# Patient Record
Sex: Female | Born: 1935 | Race: Black or African American | Hispanic: No | Smoking: Never smoker
Health system: Southern US, Community
[De-identification: ages and names within clinical notes are randomized; demographics above are authoritative.]

## PROBLEM LIST (undated history)

## (undated) DIAGNOSIS — I959 Hypotension, unspecified: Secondary | ICD-10-CM

## (undated) DIAGNOSIS — I10 Essential (primary) hypertension: Secondary | ICD-10-CM

## (undated) DIAGNOSIS — D649 Anemia, unspecified: Secondary | ICD-10-CM

## (undated) DIAGNOSIS — I739 Peripheral vascular disease, unspecified: Secondary | ICD-10-CM

## (undated) DIAGNOSIS — F329 Major depressive disorder, single episode, unspecified: Secondary | ICD-10-CM

## (undated) DIAGNOSIS — E042 Nontoxic multinodular goiter: Secondary | ICD-10-CM

## (undated) DIAGNOSIS — Z87448 Personal history of other diseases of urinary system: Secondary | ICD-10-CM

## (undated) DIAGNOSIS — K219 Gastro-esophageal reflux disease without esophagitis: Secondary | ICD-10-CM

## (undated) DIAGNOSIS — I2510 Atherosclerotic heart disease of native coronary artery without angina pectoris: Secondary | ICD-10-CM

## (undated) DIAGNOSIS — R011 Cardiac murmur, unspecified: Secondary | ICD-10-CM

## (undated) DIAGNOSIS — N814 Uterovaginal prolapse, unspecified: Secondary | ICD-10-CM

## (undated) DIAGNOSIS — I471 Supraventricular tachycardia, unspecified: Secondary | ICD-10-CM

## (undated) DIAGNOSIS — I350 Nonrheumatic aortic (valve) stenosis: Secondary | ICD-10-CM

## (undated) DIAGNOSIS — I499 Cardiac arrhythmia, unspecified: Secondary | ICD-10-CM

## (undated) DIAGNOSIS — H409 Unspecified glaucoma: Secondary | ICD-10-CM

## (undated) DIAGNOSIS — M47812 Spondylosis without myelopathy or radiculopathy, cervical region: Secondary | ICD-10-CM

## (undated) DIAGNOSIS — I779 Disorder of arteries and arterioles, unspecified: Secondary | ICD-10-CM

## (undated) DIAGNOSIS — R4189 Other symptoms and signs involving cognitive functions and awareness: Secondary | ICD-10-CM

## (undated) DIAGNOSIS — E119 Type 2 diabetes mellitus without complications: Secondary | ICD-10-CM

## (undated) DIAGNOSIS — I5032 Chronic diastolic (congestive) heart failure: Secondary | ICD-10-CM

## (undated) DIAGNOSIS — I639 Cerebral infarction, unspecified: Secondary | ICD-10-CM

## (undated) DIAGNOSIS — F32A Depression, unspecified: Secondary | ICD-10-CM

## (undated) DIAGNOSIS — M109 Gout, unspecified: Secondary | ICD-10-CM

## (undated) DIAGNOSIS — E785 Hyperlipidemia, unspecified: Secondary | ICD-10-CM

## (undated) DIAGNOSIS — N189 Chronic kidney disease, unspecified: Secondary | ICD-10-CM

## (undated) DIAGNOSIS — E569 Vitamin deficiency, unspecified: Secondary | ICD-10-CM

## (undated) DIAGNOSIS — F419 Anxiety disorder, unspecified: Secondary | ICD-10-CM

## (undated) HISTORY — DX: Other symptoms and signs involving cognitive functions and awareness: R41.89

## (undated) HISTORY — DX: Essential (primary) hypertension: I10

## (undated) HISTORY — DX: Nontoxic multinodular goiter: E04.2

## (undated) HISTORY — DX: Personal history of other diseases of urinary system: Z87.448

## (undated) HISTORY — DX: Disorder of arteries and arterioles, unspecified: I77.9

## (undated) HISTORY — DX: Unspecified glaucoma: H40.9

## (undated) HISTORY — DX: Vitamin deficiency, unspecified: E56.9

## (undated) HISTORY — DX: Anemia, unspecified: D64.9

## (undated) HISTORY — DX: Hyperlipidemia, unspecified: E78.5

## (undated) HISTORY — DX: Major depressive disorder, single episode, unspecified: F32.9

## (undated) HISTORY — DX: Depression, unspecified: F32.A

## (undated) HISTORY — PX: TUBAL LIGATION: SHX77

## (undated) HISTORY — PX: EYE SURGERY: SHX253

## (undated) HISTORY — DX: Peripheral vascular disease, unspecified: I73.9

## (undated) HISTORY — DX: Gastro-esophageal reflux disease without esophagitis: K21.9

## (undated) HISTORY — DX: Cardiac murmur, unspecified: R01.1

## (undated) HISTORY — DX: Gout, unspecified: M10.9

## (undated) HISTORY — DX: Hypotension, unspecified: I95.9

## (undated) HISTORY — DX: Spondylosis without myelopathy or radiculopathy, cervical region: M47.812

## (undated) HISTORY — DX: Type 2 diabetes mellitus without complications: E11.9

---

## 2010-06-29 DIAGNOSIS — M47812 Spondylosis without myelopathy or radiculopathy, cervical region: Secondary | ICD-10-CM | POA: Insufficient documentation

## 2012-09-11 ENCOUNTER — Emergency Department: Payer: Self-pay | Admitting: Emergency Medicine

## 2012-09-11 LAB — COMPREHENSIVE METABOLIC PANEL
Alkaline Phosphatase: 70 U/L (ref 50–136)
Anion Gap: 5 — ABNORMAL LOW (ref 7–16)
BUN: 44 mg/dL — ABNORMAL HIGH (ref 7–18)
Bilirubin,Total: 0.3 mg/dL (ref 0.2–1.0)
Calcium, Total: 9.6 mg/dL (ref 8.5–10.1)
Co2: 29 mmol/L (ref 21–32)
EGFR (African American): 34 — ABNORMAL LOW
EGFR (Non-African Amer.): 30 — ABNORMAL LOW
Glucose: 181 mg/dL — ABNORMAL HIGH (ref 65–99)
Osmolality: 295 (ref 275–301)
SGOT(AST): 38 U/L — ABNORMAL HIGH (ref 15–37)
SGPT (ALT): 28 U/L (ref 12–78)
Sodium: 140 mmol/L (ref 136–145)
Total Protein: 7.6 g/dL (ref 6.4–8.2)

## 2012-09-11 LAB — CBC
HCT: 31.7 % — ABNORMAL LOW (ref 35.0–47.0)
MCHC: 33 g/dL (ref 32.0–36.0)
Platelet: 272 10*3/uL (ref 150–440)
RBC: 3.77 10*6/uL — ABNORMAL LOW (ref 3.80–5.20)
RDW: 16.2 % — ABNORMAL HIGH (ref 11.5–14.5)

## 2012-09-11 LAB — URINALYSIS, COMPLETE
Bilirubin,UR: NEGATIVE
Blood: NEGATIVE
Ketone: NEGATIVE
Nitrite: NEGATIVE
Ph: 7 (ref 4.5–8.0)
Protein: NEGATIVE
RBC,UR: 2 /HPF (ref 0–5)
Specific Gravity: 1.015 (ref 1.003–1.030)
WBC UR: 13 /HPF (ref 0–5)

## 2012-09-11 LAB — TROPONIN I: Troponin-I: <0.02 ng/mL

## 2012-09-12 DIAGNOSIS — R634 Abnormal weight loss: Secondary | ICD-10-CM | POA: Insufficient documentation

## 2012-09-14 ENCOUNTER — Emergency Department: Payer: Self-pay | Admitting: Emergency Medicine

## 2012-09-14 LAB — TROPONIN I: Troponin-I: <0.02 ng/mL

## 2012-09-14 LAB — COMPREHENSIVE METABOLIC PANEL
Alkaline Phosphatase: 62 U/L (ref 50–136)
BUN: 20 mg/dL — ABNORMAL HIGH (ref 7–18)
Co2: 28 mmol/L (ref 21–32)
Creatinine: 1.11 mg/dL (ref 0.60–1.30)
EGFR (African American): 52 — ABNORMAL LOW
EGFR (Non-African Amer.): 45 — ABNORMAL LOW
Glucose: 183 mg/dL — ABNORMAL HIGH (ref 65–99)
Potassium: 4 mmol/L (ref 3.5–5.1)
Total Protein: 7.5 g/dL (ref 6.4–8.2)

## 2012-09-14 LAB — URINALYSIS, COMPLETE
Bilirubin,UR: NEGATIVE
Hyaline Cast: 1
Nitrite: NEGATIVE
Ph: 6 (ref 4.5–8.0)
Protein: NEGATIVE
RBC,UR: 2 /HPF (ref 0–5)
Squamous Epithelial: 13
WBC UR: 18 /HPF (ref 0–5)

## 2012-09-14 LAB — CBC
HCT: 32.5 % — ABNORMAL LOW (ref 35.0–47.0)
MCH: 28 pg (ref 26.0–34.0)
MCHC: 34 g/dL (ref 32.0–36.0)
MCV: 82 fL (ref 80–100)
RBC: 3.96 10*6/uL (ref 3.80–5.20)
RDW: 16.1 % — ABNORMAL HIGH (ref 11.5–14.5)
WBC: 7.9 10*3/uL (ref 3.6–11.0)

## 2012-09-14 LAB — TSH: Thyroid Stimulating Horm: 3.09 u[IU]/mL

## 2012-09-19 ENCOUNTER — Inpatient Hospital Stay: Payer: Self-pay | Admitting: Internal Medicine

## 2012-09-19 DIAGNOSIS — I359 Nonrheumatic aortic valve disorder, unspecified: Secondary | ICD-10-CM

## 2012-09-19 LAB — URINALYSIS, COMPLETE
Glucose,UR: >=500 mg/dL (ref 0–75)
Ketone: NEGATIVE
Leukocyte Esterase: NEGATIVE
Nitrite: NEGATIVE
Ph: 5 (ref 4.5–8.0)
Protein: NEGATIVE
Squamous Epithelial: 4
WBC UR: 4 /HPF (ref 0–5)

## 2012-09-19 LAB — CBC
HCT: 32.8 % — ABNORMAL LOW (ref 35.0–47.0)
HGB: 11.2 g/dL — ABNORMAL LOW (ref 12.0–16.0)
MCH: 28.4 pg (ref 26.0–34.0)
MCHC: 34.1 g/dL (ref 32.0–36.0)
MCV: 83 fL (ref 80–100)
Platelet: 262 10*3/uL (ref 150–440)
RBC: 3.95 10*6/uL (ref 3.80–5.20)
RDW: 15.9 % — ABNORMAL HIGH (ref 11.5–14.5)
WBC: 12 10*3/uL — ABNORMAL HIGH (ref 3.6–11.0)

## 2012-09-19 LAB — COMPREHENSIVE METABOLIC PANEL
Bilirubin,Total: 0.5 mg/dL (ref 0.2–1.0)
Calcium, Total: 9.6 mg/dL (ref 8.5–10.1)
Creatinine: 1.07 mg/dL (ref 0.60–1.30)
EGFR (Non-African Amer.): 47 — ABNORMAL LOW
Osmolality: 278 (ref 275–301)
Potassium: 3.5 mmol/L (ref 3.5–5.1)
SGOT(AST): 34 U/L (ref 15–37)

## 2012-09-19 LAB — CK TOTAL AND CKMB (NOT AT ARMC)
CK, Total: 86 U/L (ref 21–215)
CK-MB: 5.4 ng/mL — ABNORMAL HIGH (ref 0.5–3.6)
CK-MB: 5.8 ng/mL — ABNORMAL HIGH (ref 0.5–3.6)

## 2012-09-19 LAB — TROPONIN I: Troponin-I: 0.06 ng/mL — ABNORMAL HIGH

## 2012-09-20 DIAGNOSIS — I471 Supraventricular tachycardia, unspecified: Secondary | ICD-10-CM

## 2012-09-20 DIAGNOSIS — R5383 Other fatigue: Secondary | ICD-10-CM

## 2012-09-20 DIAGNOSIS — I10 Essential (primary) hypertension: Secondary | ICD-10-CM

## 2012-09-20 DIAGNOSIS — R5381 Other malaise: Secondary | ICD-10-CM

## 2012-09-20 DIAGNOSIS — E860 Dehydration: Secondary | ICD-10-CM

## 2012-09-20 LAB — BASIC METABOLIC PANEL
Anion Gap: 7 (ref 7–16)
BUN: 12 mg/dL (ref 7–18)
Co2: 24 mmol/L (ref 21–32)
Creatinine: 0.78 mg/dL (ref 0.60–1.30)
EGFR (African American): >60
EGFR (Non-African Amer.): >60
Glucose: 155 mg/dL — ABNORMAL HIGH (ref 65–99)
Osmolality: 280 (ref 275–301)
Sodium: 139 mmol/L (ref 136–145)

## 2012-09-20 LAB — CBC WITH DIFFERENTIAL/PLATELET
Basophil #: 0 10*3/uL (ref 0.0–0.1)
Basophil %: 0.4 %
Eosinophil %: 0.5 %
HCT: 27.1 % — ABNORMAL LOW (ref 35.0–47.0)
HGB: 9.2 g/dL — ABNORMAL LOW (ref 12.0–16.0)
Lymphocyte %: 8.2 %
MCHC: 34 g/dL (ref 32.0–36.0)
Monocyte #: 1 x10 3/mm — ABNORMAL HIGH (ref 0.2–0.9)
Monocyte %: 10.4 %
Neutrophil #: 7.5 10*3/uL — ABNORMAL HIGH (ref 1.4–6.5)
RBC: 3.24 10*6/uL — ABNORMAL LOW (ref 3.80–5.20)
RDW: 15.8 % — ABNORMAL HIGH (ref 11.5–14.5)

## 2012-09-20 LAB — CK TOTAL AND CKMB (NOT AT ARMC): CK-MB: 5.1 ng/mL — ABNORMAL HIGH (ref 0.5–3.6)

## 2012-09-21 DIAGNOSIS — I509 Heart failure, unspecified: Secondary | ICD-10-CM

## 2012-09-21 LAB — CBC WITH DIFFERENTIAL/PLATELET
Basophil #: 0.1 10*3/uL (ref 0.0–0.1)
Basophil %: 0.8 %
Eosinophil %: 3.8 %
HCT: 23.2 % — ABNORMAL LOW (ref 35.0–47.0)
HGB: 8 g/dL — ABNORMAL LOW (ref 12.0–16.0)
Lymphocyte #: 0.8 10*3/uL — ABNORMAL LOW (ref 1.0–3.6)
Lymphocyte %: 12.1 %
MCH: 28.5 pg (ref 26.0–34.0)
Monocyte #: 0.6 x10 3/mm (ref 0.2–0.9)
Monocyte %: 9.1 %
RBC: 2.8 10*6/uL — ABNORMAL LOW (ref 3.80–5.20)
RDW: 15.6 % — ABNORMAL HIGH (ref 11.5–14.5)
WBC: 6.9 10*3/uL (ref 3.6–11.0)

## 2012-09-21 LAB — FERRITIN: Ferritin (ARMC): 175 ng/mL (ref 8–388)

## 2012-09-21 LAB — BASIC METABOLIC PANEL
Anion Gap: 5 — ABNORMAL LOW (ref 7–16)
Chloride: 110 mmol/L — ABNORMAL HIGH (ref 98–107)
Co2: 25 mmol/L (ref 21–32)
EGFR (Non-African Amer.): >60
Potassium: 2.9 mmol/L — ABNORMAL LOW (ref 3.5–5.1)
Sodium: 140 mmol/L (ref 136–145)

## 2012-09-21 LAB — RETICULOCYTES: Reticulocyte: 1.45 %

## 2012-09-21 LAB — IRON AND TIBC
Iron Bind.Cap.(Total): 166 ug/dL — ABNORMAL LOW (ref 250–450)
Unbound Iron-Bind.Cap.: 152 ug/dL

## 2012-09-21 LAB — POTASSIUM: Potassium: 3.7 mmol/L (ref 3.5–5.1)

## 2012-09-21 LAB — FOLATE: Folic Acid: 5.7 ng/mL (ref 3.1–100.0)

## 2012-09-21 LAB — LACTATE DEHYDROGENASE: LDH: 180 U/L (ref 81–246)

## 2012-09-22 LAB — CBC WITH DIFFERENTIAL/PLATELET
Basophil #: 0.1 10*3/uL (ref 0.0–0.1)
Eosinophil #: 0.1 10*3/uL (ref 0.0–0.7)
Eosinophil %: 1.8 %
Lymphocyte #: 0.6 10*3/uL — ABNORMAL LOW (ref 1.0–3.6)
MCH: 28.6 pg (ref 26.0–34.0)
MCHC: 34.5 g/dL (ref 32.0–36.0)
Monocyte #: 0.6 x10 3/mm (ref 0.2–0.9)
Neutrophil #: 6.2 10*3/uL (ref 1.4–6.5)
RBC: 2.72 10*6/uL — ABNORMAL LOW (ref 3.80–5.20)
WBC: 7.6 10*3/uL (ref 3.6–11.0)

## 2012-09-22 LAB — BASIC METABOLIC PANEL
BUN: 7 mg/dL (ref 7–18)
Chloride: 113 mmol/L — ABNORMAL HIGH (ref 98–107)
Sodium: 142 mmol/L (ref 136–145)

## 2012-09-22 LAB — MAGNESIUM: Magnesium: 1.5 mg/dL — ABNORMAL LOW

## 2012-09-23 LAB — MAGNESIUM: Magnesium: 2 mg/dL

## 2012-09-23 LAB — CBC WITH DIFFERENTIAL/PLATELET
Basophil #: 0.1 10*3/uL (ref 0.0–0.1)
Basophil %: 1 %
Eosinophil #: 0.3 10*3/uL (ref 0.0–0.7)
HCT: 22.8 % — ABNORMAL LOW (ref 35.0–47.0)
Lymphocyte #: 0.6 10*3/uL — ABNORMAL LOW (ref 1.0–3.6)
MCH: 27.2 pg (ref 26.0–34.0)
MCHC: 33 g/dL (ref 32.0–36.0)
MCV: 83 fL (ref 80–100)
Monocyte #: 0.5 x10 3/mm (ref 0.2–0.9)
RBC: 2.76 10*6/uL — ABNORMAL LOW (ref 3.80–5.20)
RDW: 15.6 % — ABNORMAL HIGH (ref 11.5–14.5)
WBC: 6.9 10*3/uL (ref 3.6–11.0)

## 2012-09-23 LAB — BASIC METABOLIC PANEL
Calcium, Total: 8.5 mg/dL (ref 8.5–10.1)
Chloride: 111 mmol/L — ABNORMAL HIGH (ref 98–107)
Co2: 23 mmol/L (ref 21–32)
Creatinine: 0.62 mg/dL (ref 0.60–1.30)
EGFR (African American): >60
Glucose: 105 mg/dL — ABNORMAL HIGH (ref 65–99)
Potassium: 3.9 mmol/L (ref 3.5–5.1)
Sodium: 140 mmol/L (ref 136–145)

## 2012-09-24 LAB — CBC WITH DIFFERENTIAL/PLATELET
Basophil #: 0.1 10*3/uL (ref 0.0–0.1)
Basophil %: 0.8 %
Eosinophil %: 5.2 %
Lymphocyte #: 0.9 10*3/uL — ABNORMAL LOW (ref 1.0–3.6)
Lymphocyte %: 11.3 %
MCH: 27.8 pg (ref 26.0–34.0)
MCHC: 33.9 g/dL (ref 32.0–36.0)
MCV: 82 fL (ref 80–100)
Monocyte #: 0.4 x10 3/mm (ref 0.2–0.9)
Monocyte %: 5.2 %
Neutrophil #: 6.1 10*3/uL (ref 1.4–6.5)
Neutrophil %: 77.5 %
Platelet: 213 10*3/uL (ref 150–440)
RBC: 3 10*6/uL — ABNORMAL LOW (ref 3.80–5.20)
RDW: 15.6 % — ABNORMAL HIGH (ref 11.5–14.5)

## 2012-09-24 LAB — PATHOLOGY REPORT

## 2012-12-17 ENCOUNTER — Ambulatory Visit (INDEPENDENT_AMBULATORY_CARE_PROVIDER_SITE_OTHER): Payer: Medicare Other | Admitting: Cardiovascular Disease

## 2012-12-17 ENCOUNTER — Encounter: Payer: Self-pay | Admitting: Cardiovascular Disease

## 2012-12-17 ENCOUNTER — Encounter (INDEPENDENT_AMBULATORY_CARE_PROVIDER_SITE_OTHER): Payer: Self-pay

## 2012-12-17 VITALS — BP 160/90 | HR 75 | Ht 64.0 in | Wt 153.0 lb

## 2012-12-17 DIAGNOSIS — I471 Supraventricular tachycardia: Secondary | ICD-10-CM

## 2012-12-17 DIAGNOSIS — I5032 Chronic diastolic (congestive) heart failure: Secondary | ICD-10-CM

## 2012-12-17 DIAGNOSIS — I10 Essential (primary) hypertension: Secondary | ICD-10-CM

## 2012-12-17 DIAGNOSIS — I509 Heart failure, unspecified: Secondary | ICD-10-CM

## 2012-12-17 DIAGNOSIS — I498 Other specified cardiac arrhythmias: Secondary | ICD-10-CM

## 2012-12-17 DIAGNOSIS — E119 Type 2 diabetes mellitus without complications: Secondary | ICD-10-CM

## 2012-12-17 DIAGNOSIS — R0602 Shortness of breath: Secondary | ICD-10-CM

## 2012-12-17 MED ORDER — AMLODIPINE BESYLATE 10 MG PO TABS: 10.0000 mg | ORAL_TABLET | Freq: Every day | ORAL | Status: DC

## 2012-12-17 MED ORDER — LISINOPRIL 40 MG PO TABS: 40.0000 mg | ORAL_TABLET | Freq: Every day | ORAL | Status: DC

## 2012-12-17 NOTE — Assessment & Plan Note (Signed)
Blood pressure is very high today. We will restart lisinopril 40 mg daily. We have asked the daughter to closely monitor her her mothers blood pressure. If he continues to run high, we will start amlodipine 5 mg daily with slow titration up to 10 mg if needed.

## 2012-12-17 NOTE — Assessment & Plan Note (Signed)
We have encouraged continued exercise, careful diet management in an effort to lose weight. 

## 2012-12-17 NOTE — Assessment & Plan Note (Signed)
Appears relatively euvolemic on her visit today. Not drinking much at baseline. No diuretics needed.

## 2012-12-17 NOTE — Patient Instructions (Signed)
Blood pressure is high Please restart lisinopril 40 mg one a day Monitor blood pressure If it continues to run high, Start amlodipine 1/2 pill a day  If it runs consistently greater than 140, increase the amlodipine to 10 mg a day  Please call us if you have new issues that need to be addressed before your next appt.  Your physician wants you to follow-up in: 3 months.  You will receive a reminder letter in the mail two months in advance. If you don't receive a letter, please call our office to schedule the follow-up appointment.

## 2012-12-17 NOTE — Progress Notes (Signed)
Patient ID: [name], female    DOB: 11/05/1924, 79 y.o.   MRN: [id number]  HPI Comments: [name] is a pleasant 79 year old woman with a history of diabetes, hypertension, who was admitted to the hospital 09/20/2012 discharged on 09/25/2012 for Klebsiella UTI, shortness breath, tachycardia, SVT. Arrival to the hospital, she was very weak and was unable to swallow. She was felt to be very dehydrated. Her potassium was repleted, she was started on metoprolol and Cardizem. She presents today for followup establish care in the office.   Echocardiogram in the hospital 09/19/2012 showed ejection fraction 0000000, diastolic dysfunction, mild LVH, moderate aortic valve stenosis, normal RV function and size  CT scan of the neck showed enlargement of the thyroid with multinodular appearance, atherosclerotic disease, right pleural effusion Lab work in the hospital 09/11/2012 showing creatinine 1.57, BUN 44 Followup blood work showed creatinine 1.07, BUN 17 on July 23  In followup today, she reports that she is doing well. Daughter is concerned as she is sleeping sometimes during the daytime and when this happens, she has acute shortness of breath as she falls asleep. She walks around the house, does not walk outside the house very much. Typically uses a cane and walker. Otherwise she has no significant complaints. She denies any tachycardia or palpitations concerning for recurrent SVT.   EKG shows normal sinus rhythm with rate 75 beats per minute, nonspecific ST and T wave abnormality in V4 through V6, 3, aVF      Outpatient Encounter Prescriptions as of 12/17/2012  Medication Sig Dispense Refill  . amLODipine (NORVASC) 10 MG tablet Take 1 tablet (10 mg total) by mouth daily.  90 tablet  3  . aspirin 81 MG tablet Take 81 mg by mouth daily.      . cholecalciferol (VITAMIN D) 1000 UNITS tablet Take 1,000 Units by mouth daily.      . clotrimazole (LOTRIMIN) 1 % cream Apply 1 application topically  as needed.      . colchicine 0.6 MG tablet Take 0.6 mg by mouth daily.      . cyclobenzaprine (FLEXERIL) 10 MG tablet Take 10 mg by mouth 3 (three) times daily as needed for muscle spasms.      Marland Kitchen gabapentin (NEURONTIN) 300 MG capsule Take 300 mg by mouth 2 (two) times daily.      Marland Kitchen glycopyrrolate (ROBINUL) 1 MG tablet Take by mouth. Takes 1/4 tab;et three times daily.      Marland Kitchen HYDROcodone-acetaminophen (NORCO/VICODIN) 5-325 MG per tablet Take 1 tablet by mouth every 6 (six) hours as needed for pain.      Marland Kitchen latanoprost (XALATAN) 0.005 % ophthalmic solution Place 1 drop into the right eye at bedtime.      Marland Kitchen lisinopril (PRINIVIL,ZESTRIL) 40 MG tablet Take 1 tablet (40 mg total) by mouth daily.  90 tablet  3  . lovastatin (MEVACOR) 40 MG tablet Take 40 mg by mouth at bedtime.      . Magnesium 250 MG TABS Take 2 tablets by mouth daily.      . megestrol (MEGACE) 40 MG/ML suspension Take 200 mg by mouth daily.      . metoprolol succinate (TOPROL-XL) 50 MG 24 hr tablet Take 50 mg by mouth daily. Take with or immediately following a meal.      . pantoprazole (PROTONIX) 40 MG tablet Take 40 mg by mouth 2 (two) times daily.      . Pediatric Multiple Vit-C-FA (FLINSTONES GUMMIES OMEGA-3 [place]) CHEW Chew by  mouth daily.      . pilocarpine (PILOCAR) 4 % ophthalmic solution Place 1 drop into the right eye at bedtime.        Review of Systems  Constitutional: Positive for fatigue.  HENT: Negative.   Eyes: Negative.   Respiratory: Positive for shortness of breath.   Cardiovascular: Negative.   Gastrointestinal: Negative.   Endocrine: Negative.   Musculoskeletal: Positive for gait problem.  Skin: Negative.   Allergic/Immunologic: Negative.   Neurological: Negative.   Hematological: Negative.   Psychiatric/Behavioral: Negative.   All other systems reviewed and are negative.    BP 160/90  Pulse 75  Ht 5\' 4"  (1.626 m)  Wt 153 lb (69.4 kg)  BMI 26.25 kg/m2  Physical Exam  Nursing note and vitals  reviewed. Constitutional: She is oriented to person, place, and time. She appears well-developed and well-nourished.  HENT:  Head: Normocephalic.  Nose: Nose normal.  Mouth/Throat: Oropharynx is clear and moist.  Eyes: Conjunctivae are normal. Pupils are equal, round, and reactive to light.  Neck: Normal range of motion. Neck supple. No JVD present.  Cardiovascular: Normal rate, regular rhythm, S1 normal, S2 normal, normal heart sounds and intact distal pulses.  Exam reveals no gallop and no friction rub.   No murmur heard. Pulmonary/Chest: Effort normal and breath sounds normal. No respiratory distress. She has no wheezes. She has no rales. She exhibits no tenderness.  Abdominal: Soft. Bowel sounds are normal. She exhibits no distension. There is no tenderness.  Musculoskeletal: Normal range of motion. She exhibits no edema and no tenderness.  Lymphadenopathy:    She has no cervical adenopathy.  Neurological: She is alert and oriented to person, place, and time. Coordination normal.  Skin: Skin is warm and dry. No rash noted. No erythema.  Psychiatric: She has a normal mood and affect. Her behavior is normal. Judgment and thought content normal.    Assessment and Plan

## 2012-12-17 NOTE — Assessment & Plan Note (Addendum)
No further episodes of SVT for the patient. We'll continue her metoprolol.

## 2012-12-17 NOTE — Assessment & Plan Note (Signed)
Etiology of her acute episodes of shortness of breath as she falls asleep could be from sleep apnea. She does not appear to be short of breath with walking or movement. Does not appear to be fluid overloaded concerning for CHF.

## 2012-12-19 ENCOUNTER — Encounter: Payer: Self-pay | Admitting: *Deleted

## 2012-12-28 ENCOUNTER — Telehealth: Payer: Self-pay | Admitting: *Deleted

## 2012-12-28 NOTE — Telephone Encounter (Signed)
Bp readings: 12/11/12  128/65 pulse 80 @9 :30 am                  129/79 pulse 75 @ ?                  115/70 pulse 75 @ ?

## 2012-12-28 NOTE — Telephone Encounter (Signed)
Left message on machine that bp readings looked good and asked pt to call with any questions or concerns.

## 2013-02-28 DIAGNOSIS — G244 Idiopathic orofacial dystonia: Secondary | ICD-10-CM | POA: Insufficient documentation

## 2013-04-05 ENCOUNTER — Ambulatory Visit (INDEPENDENT_AMBULATORY_CARE_PROVIDER_SITE_OTHER): Payer: Medicare Other | Admitting: Cardiovascular Disease

## 2013-04-05 ENCOUNTER — Encounter: Payer: Self-pay | Admitting: Cardiovascular Disease

## 2013-04-05 VITALS — BP 124/82 | HR 81 | Ht 60.0 in | Wt 141.2 lb

## 2013-04-05 DIAGNOSIS — I5032 Chronic diastolic (congestive) heart failure: Secondary | ICD-10-CM

## 2013-04-05 DIAGNOSIS — I471 Supraventricular tachycardia: Secondary | ICD-10-CM

## 2013-04-05 DIAGNOSIS — E119 Type 2 diabetes mellitus without complications: Secondary | ICD-10-CM

## 2013-04-05 DIAGNOSIS — I10 Essential (primary) hypertension: Secondary | ICD-10-CM

## 2013-04-05 DIAGNOSIS — I498 Other specified cardiac arrhythmias: Secondary | ICD-10-CM

## 2013-04-05 DIAGNOSIS — I509 Heart failure, unspecified: Secondary | ICD-10-CM

## 2013-04-05 NOTE — Assessment & Plan Note (Signed)
No further arrhythmia per the patient.

## 2013-04-05 NOTE — Patient Instructions (Signed)
You are doing well. No medication changes were made.  Please call us if you have new issues that need to be addressed before your next appt.  Your physician wants you to follow-up in: 12 months.  You will receive a reminder letter in the mail two months in advance. If you don't receive a letter, please call our office to schedule the follow-up appointment. 

## 2013-04-05 NOTE — Progress Notes (Signed)
Patient ID: [name], female    DOB: 1924/12/25, 80 y.o.   MRN: [id number]  HPI Comments: [name] is a pleasant 80 year old woman with a history of diabetes, hypertension, who was admitted to the hospital 09/20/2012 discharged on 09/25/2012 for Klebsiella UTI, shortness breath, tachycardia, SVT. Arrival to the hospital, she was very weak and was unable to swallow. She was felt to be very dehydrated. Her potassium was repleted, she was started on metoprolol and Cardizem. She presents today for routine followup  She presents with her daughter. In general, she reports that she is doing well. She reports that she pushed herself during the Christmas holiday to get her house ready for family and visitors. She did have an episode of weakness and required rest and recovery. Daughter was concerned that she pushed herself too far. Other than this, she has been doing well with no complaints. She denies any significant chest pain, shortness of breath, leg edema or dizziness. She denies any tachycardia or palpitations. She walks around the house, does not walk outside the house very much. Typically uses a cane and walker. Otherwise she has no significant complaints.   Echocardiogram in the hospital 09/19/2012 showed ejection fraction 0000000, diastolic dysfunction, mild LVH, moderate aortic valve stenosis, normal RV function and size  CT scan of the neck showed enlargement of the thyroid with multinodular appearance, atherosclerotic disease, right pleural effusion Lab work in the hospital 09/11/2012 showing creatinine 1.57, BUN 44 Followup blood work showed creatinine 1.07, BUN 17 on July 23  EKG shows normal sinus rhythm with rate 81 beats per minute, nonspecific ST and T wave abnormality in V4 through V6, 3, aVF     Outpatient Encounter Prescriptions as of 04/05/2013  Medication Sig  . aspirin 81 MG tablet Take 81 mg by mouth daily.  . brimonidine (ALPHAGAN P) 0.1 % SOLN Place 1 drop into the  right eye 2 (two) times daily.  . brimonidine (ALPHAGAN) 0.15 % ophthalmic solution Place 1 drop into the right eye 2 (two) times daily.  . cholecalciferol (VITAMIN D) 1000 UNITS tablet Take 2,000 Units by mouth daily.   . clotrimazole (LOTRIMIN) 1 % cream Apply 1 application topically as needed.  . colchicine 0.6 MG tablet Take 0.6 mg by mouth daily.  . cyclobenzaprine (FLEXERIL) 10 MG tablet Take 10 mg by mouth 3 (three) times daily as needed for muscle spasms.  . Dorzolamide HCl-Timolol Mal (COSOPT OP) Apply to eye 2 (two) times daily.  Marland Kitchen gabapentin (NEURONTIN) 300 MG capsule Take 300 mg by mouth 2 (two) times daily.  Marland Kitchen glimepiride (AMARYL) 1 MG tablet Take 1 mg by mouth daily with breakfast.  . glycopyrrolate (ROBINUL) 1 MG tablet Take by mouth. Takes 1/4 tab;et three times daily.  Marland Kitchen HYDROcodone-acetaminophen (NORCO/VICODIN) 5-325 MG per tablet Take 1 tablet by mouth every 6 (six) hours as needed for pain.  . iron polysaccharides (NIFEREX) 150 MG capsule Take 150 mg by mouth 2 (two) times daily.  Marland Kitchen lisinopril (PRINIVIL,ZESTRIL) 40 MG tablet Take 1 tablet (40 mg total) by mouth daily.  Marland Kitchen lovastatin (MEVACOR) 40 MG tablet Take 40 mg by mouth at bedtime.  . Magnesium 250 MG TABS Take 2 tablets by mouth daily.  . metoprolol succinate (TOPROL-XL) 50 MG 24 hr tablet Take 50 mg by mouth daily. Take with or immediately following a meal.  . pantoprazole (PROTONIX) 40 MG tablet Take 40 mg by mouth 2 (two) times daily.  . pilocarpine (PILOCAR) 4 % ophthalmic solution  Place 1 drop into the right eye 4 (four) times daily.     Review of Systems  Constitutional: Positive for fatigue.  HENT: Negative.   Eyes: Negative.   Cardiovascular: Negative.   Gastrointestinal: Negative.   Endocrine: Negative.   Musculoskeletal: Positive for gait problem.  Skin: Negative.   Allergic/Immunologic: Negative.   Neurological: Negative.   Hematological: Negative.   Psychiatric/Behavioral: Negative.   All other  systems reviewed and are negative.    BP 124/82  Pulse 81  Ht 5' (1.524 m)  Wt 141 lb 4 oz (64.071 kg)  BMI 27.59 kg/m2  Physical Exam  Nursing note and vitals reviewed. Constitutional: She is oriented to person, place, and time. She appears well-developed and well-nourished.  HENT:  Head: Normocephalic.  Nose: Nose normal.  Mouth/Throat: Oropharynx is clear and moist.  Eyes: Conjunctivae are normal. Pupils are equal, round, and reactive to light.  Neck: Normal range of motion. Neck supple. No JVD present.  Cardiovascular: Normal rate, regular rhythm, S1 normal, S2 normal, normal heart sounds and intact distal pulses.  Exam reveals no gallop and no friction rub.   No murmur heard. Pulmonary/Chest: Effort normal and breath sounds normal. No respiratory distress. She has no wheezes. She has no rales. She exhibits no tenderness.  Abdominal: Soft. Bowel sounds are normal. She exhibits no distension. There is no tenderness.  Musculoskeletal: Normal range of motion. She exhibits no edema and no tenderness.  Lymphadenopathy:    She has no cervical adenopathy.  Neurological: She is alert and oriented to person, place, and time. Coordination normal.  Skin: Skin is warm and dry. No rash noted. No erythema.  Psychiatric: She has a normal mood and affect. Her behavior is normal. Judgment and thought content normal.    Assessment and Plan

## 2013-04-05 NOTE — Assessment & Plan Note (Signed)
Blood pressure is well controlled on today's visit. No changes made to the medications. 

## 2013-04-05 NOTE — Assessment & Plan Note (Signed)
She appears relatively euvolemic on today's visit. No medication changes made

## 2013-04-05 NOTE — Assessment & Plan Note (Signed)
We have encouraged continued exercise, careful diet management in an effort to lose weight. 

## 2013-12-25 ENCOUNTER — Other Ambulatory Visit: Payer: Self-pay | Admitting: Cardiovascular Disease

## 2014-03-10 DIAGNOSIS — L602 Onychogryphosis: Secondary | ICD-10-CM | POA: Insufficient documentation

## 2014-03-10 DIAGNOSIS — M2042 Other hammer toe(s) (acquired), left foot: Secondary | ICD-10-CM | POA: Insufficient documentation

## 2014-03-10 DIAGNOSIS — E1142 Type 2 diabetes mellitus with diabetic polyneuropathy: Secondary | ICD-10-CM | POA: Insufficient documentation

## 2014-03-10 DIAGNOSIS — M7741 Metatarsalgia, right foot: Secondary | ICD-10-CM | POA: Insufficient documentation

## 2014-03-10 DIAGNOSIS — M2041 Other hammer toe(s) (acquired), right foot: Secondary | ICD-10-CM | POA: Insufficient documentation

## 2014-03-10 DIAGNOSIS — M7742 Metatarsalgia, left foot: Secondary | ICD-10-CM | POA: Insufficient documentation

## 2014-04-01 ENCOUNTER — Other Ambulatory Visit: Payer: Self-pay | Admitting: Cardiovascular Disease

## 2014-06-16 ENCOUNTER — Emergency Department
Admit: 2014-06-16 | Disposition: A | Discharge status: Home / Self Care | Payer: Self-pay | Admitting: Emergency Medicine

## 2014-06-16 DIAGNOSIS — R197 Diarrhea, unspecified: Secondary | ICD-10-CM | POA: Insufficient documentation

## 2014-06-16 LAB — COMPREHENSIVE METABOLIC PANEL
ALBUMIN: 3.6 g/dL
ALT: 13 U/L — AB
AST: 21 U/L
Alkaline Phosphatase: 55 U/L
Anion Gap: 8 (ref 7–16)
BUN: 19 mg/dL
Bilirubin,Total: 0.9 mg/dL
Calcium, Total: 8.9 mg/dL
Chloride: 104 mmol/L
Co2: 27 mmol/L
Creatinine: 1.1 mg/dL — ABNORMAL HIGH
EGFR (Non-African Amer.): 45 — ABNORMAL LOW
GFR CALC AF AMER: 52 — AB
Glucose: 216 mg/dL — ABNORMAL HIGH
Potassium: 3.2 mmol/L — ABNORMAL LOW
Sodium: 139 mmol/L
TOTAL PROTEIN: 6.5 g/dL

## 2014-06-16 LAB — URINALYSIS, COMPLETE
BILIRUBIN, UR: NEGATIVE
GLUCOSE, UR: NEGATIVE mg/dL (ref 0–75)
KETONE: NEGATIVE
Nitrite: NEGATIVE
Ph: 6 (ref 4.5–8.0)
Protein: 100
Specific Gravity: 1.015 (ref 1.003–1.030)

## 2014-06-16 LAB — TROPONIN I
Troponin-I: 0.09 ng/mL — ABNORMAL HIGH
Troponin-I: 0.08 ng/mL — ABNORMAL HIGH

## 2014-06-16 LAB — CBC
HCT: 38.1 % (ref 35.0–47.0)
HGB: 12.7 g/dL (ref 12.0–16.0)
MCH: 30.6 pg (ref 26.0–34.0)
MCHC: 33.5 g/dL (ref 32.0–36.0)
MCV: 91 fL (ref 80–100)
Platelet: 218 10*3/uL (ref 150–440)
RBC: 4.16 10*6/uL (ref 3.80–5.20)
RDW: 14 % (ref 11.5–14.5)
WBC: 12 10*3/uL — AB (ref 3.6–11.0)

## 2014-06-16 LAB — LIPASE, BLOOD: LIPASE: 26 U/L

## 2014-06-16 LAB — MAGNESIUM: Magnesium: 1.8 mg/dL

## 2014-06-19 LAB — URINE CULTURE

## 2014-06-20 NOTE — H&P (Signed)
PATIENT NAME:  [name], GABLER MR#:  [id number] DATE OF BIRTH:  07-01-1924  DATE OF ADMISSION:  09/19/2012  PRIMARY CARE PHYSICIAN: PCP in [place].   CHIEF COMPLAINT: Generalized weakness, poor p.o. intake and lethargy.   HISTORY OF PRESENT ILLNESS: This is an 81 year old female, who presents to the hospital due to generalized weakness, lethargy and poor p.o. intake. As per the daughter, the patient over the past month, has had poor p.o. intake complaining of a bitter taste in her mouth and not eating much. She came to the ER in the middle of July and on July 15th was diagnosed with some dehydration, given some fluids and discharged back home. They called the patient's family back, as her urinalysis was positive and she was noted to have a Klebsiella UTI. Ciprofloxacin was called in for her and she has been taking it, although her weakness and poor p.o. intake has not improved. Today, she was unable to even get out of bed. Therefore, family was a bit concerned and brought her to the ER for further evaluation. The patient denies any fevers. She denies any abdominal pain, nausea, vomiting, diarrhea, any chest pain, any shortness of breath, any dizziness, any loss of consciousness or any other associated symptoms presently. When the patient presented to the ER, she was noted to be in SVT with heart rates in the 160s, which resolved just with vagal maneuvers. She was noted to have an elevated troponin of 0.06. Hospitalist services were contacted for further evaluation.   REVIEW OF SYSTEMS: CONSTITUTIONAL: No documented fever. Positive 10 pound weight loss. No weight gain. EYES: No blurry or double vision. ENT: No tinnitus. No postnasal drip. No redness of the oropharynx. RESPIRATORY: No cough. No wheeze. No hemoptysis. No dyspnea. CARDIOVASCULAR: No chest pain. No orthopnea. No palpitations. No syncope.  GASTROINTESTINAL: No nausea. No vomiting. No diarrhea. No abdominal pain. No melena or hematochezia.  GENITOURINARY: No dysuria or hematuria. ENDOCRINE: No polyuria or nocturia. No heat or cold intolerance. HEMATOLOGIC: No anemia. No bruising. No bleeding.  INTEGUMENTARY: No rashes or lesions. MUSCULOSKELETAL: No arthritis. No swelling. No gout. NEUROLOGIC: No numbness or tingling. No ataxia. No seizure-type activity. PSYCHIATRIC: No anxiety. No insomnia. No ADD.   PAST MEDICAL HISTORY: Consistent with diabetes, hypertension, hyperlipidemia, history of congestive heart failure, history of glaucoma, GERD, depression and gout.   ALLERGIES: METFORMIN.   SOCIAL HISTORY: No smoking. No alcohol abuse. No illicit drug abuse. Lives at home by herself.   FAMILY HISTORY: Mother had glaucoma and died from old age. Father died in his 46s from possibly colitis and peptic ulcer disease.   CURRENT MEDICATIONS: As follows: Tylenol with hydrocodone 5/325 1/2 tab to 1 tab q.4 hours as needed for pain, aspirin 81 mg daily, brimonidine eye drops 0.15% drops 1 drop to the right eye b.i.d., ciprofloxacin 250 mg b.i.d., colchicine 0.6 mg daily, cyclobenzaprine 10 mg t.i.d. as needed, dorzolamide 2% drops 1 drop to right eye b.i.d., fluocinolone topical cream to be applied to the affected area as needed, gabapentin 600 mg t.i.d., hydrochlorothiazide 12.5 mg daily, Lantus 8 units at bedtime, latanoprost 0.005% drop to the right eye at bedtime, lisinopril 40 mg daily, lovastatin 40 mg daily, magnesium oxide 240 mg 2 tabs daily, Toprol 50 mg daily, omeprazole 20 mg daily, Protonix 40 mg daily, Peridex b.i.d., pilocarpine 4% ophthalmic drop q.i.d. to the right eye, sertraline 100 mg daily and vitamin D3 1000 international units 2 tabs daily.   PHYSICAL EXAMINATION: Presently is as follows:  VITAL SIGNS: Temperature is 98.1, pulse 101, respirations 20, blood pressure 120/67 and sats 100% on room air.  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  HEAD, EYES, EARS, NOSE, THROAT EXAM: Atraumatic,  normocephalic. Extraocular muscles are intact. Pupils equal and reactive to light. Sclerae anicteric. No conjunctival injection. No pharyngeal erythema.  NECK: Supple. There is no jugular venous distention. No bruits, no lymphadenopathy or thyromegaly.  HEART: Regular rate and rhythm. No murmurs. No rubs. No clicks.  LUNGS: Clear to auscultation bilaterally. No rales. No rhonchi. No wheezes.  ABDOMEN: Soft, flat, nontender, nondistended. Has good bowel sounds. No hepatosplenomegaly appreciated.  EXTREMITIES: No evidence of any cyanosis, clubbing, or peripheral edema. Has +2 pedal and radial pulses bilaterally.  NEUROLOGIC: She is alert, awake, and oriented x 3 with no focal motor or sensory deficits appreciated bilaterally.  SKIN: Moist and warm with no rash appreciated.  BACK: There is no cervical or axillary or lymphadenopathy.  LABORATORY EXAM: Showed a serum glucose of 251, BUN 17, creatinine 1.07, sodium 134, potassium 3.5, chloride 102, bicarbonate 20. LFTs within normal limits. Troponin 0.06. White cell count 12, hemoglobin 11.2, hematocrit 32.9, platelet count of 262. The patient did have a chest x-ray done, which showed no evidence of acute cardiopulmonary disease.   ASSESSMENT AND PLAN: This is an 81 year old female with a history of hypertension, history of congestive heart failure, diabetes, glaucoma, gout, hyperlipidemia, gastroesophageal reflux disease and depression, who presented to hospital due to generalized weakness, poor p.o. intake and weight loss and noted to be in supraventricular tachycardia and also noted to have an elevated troponin.  1.  Dehydration. This is likely due to poor p.o. intake and also complicated with underlying urinary tract infection as the patient has failed outpatient therapy with oral antibiotics. I will hydrate the patient with intravenous fluids start her on intravenous ceftriaxone for the urinary tract infection and follow her clinically and encourage p.o.  intake.  2.  Generalized weakness. This is likely secondary to her dehydration and also underlying urinary tract infection. I will hydrate her with intravenous fluids, give her intravenous antibiotics and follow her clinically. We will get a physical therapy consult to assess her mobility in the morning.  3.  Urinary tract infection. This is a Klebsiella urinary tract infection, which was diagnosed on 09/11/2012. The patient was on oral Cipro, but has failed outpatient therapy, therefore I will start on intravenous ceftriaxone and follow.  4.  Hypertension. The patient is presently hemodynamically stable, but given the dehydration, I will hold her hydrochlorothiazide and ACE inhibitor. I will continue her Toprol for now.  5.  Glaucoma. Continue with dorzolamide, brimonidine and latanoprost eye drops.   6.  Gastroesophageal reflux disease. Continue Protonix.  7.  Diabetes. We will place heron sliding scale insulin since her p.o. intake is poor. Hold Lantus for now.  8.  Supraventricular tachycardia. The patient presented with supraventricular tachycardia with heart rates in the 160s, which resolved just with vagal maneuvers. This is likely secondary to dehydration and urinary tract infection. I will continue her Toprol for now and keep her on telemetry.  9.  Elevated troponin. This likely in the setting of the supraventricular tachycardia and demand ischemia. The patient has no chest pain and no evidence of acute coronary syndrome. I will continue her aspirin, her Toprol and a statin for now. I will place on telemetry, follow serial cardiac markers and get a 2-dimensional echocardiogram.   The plan was discussed with  the patient's daughters and they are in agreement.   TIME SPENT ON ADMISSION: 50 minutes.  ____________________________ Belia Heman. Verdell Carmine, MD vjs:aw D: 09/19/2012 12:48:17 ET T: 09/19/2012 13:17:38 ET JOB#: [id number]  cc: Belia Heman. Verdell Carmine, MD, <Dictator> Henreitta Leber  MD ELECTRONICALLY SIGNED 09/29/2012 15:22

## 2014-06-20 NOTE — Discharge Summary (Signed)
PATIENT NAME:  [name], [name] MR#:  [id number] DATE OF BIRTH:  [birth year]  DATE OF ADMISSION:  09/19/2012 DATE OF DISCHARGE:  09/25/2012  FINAL DIAGNOSES:  1. Weakness and moderate malnutrition due to bad taste and salivation in mouth, not eating well.  2. Supraventricular tachycardia on presentation, due to electrolyte imbalance.  3. Multinodular goiter. Needs to follow up with endocrine clinic.  4. Urinary incontinence.  5. Urinary tract infection.  6. Hypertension.  7. Anemia.  8. Chronic diastolic heart failure.   CONDITION ON DISCHARGE: Stable.   CODE STATUS: Full code.   MEDICATIONS ON DISCHARGE:  1. Lovastatin 40 mg once a day.  2. Magnesium oxide 250 mg oral tablet once a day.  3. Vitamin D3 1000 international units 2 capsules once a day.  4. Latanoprost ophthalmic solution 1 drop to each eye once a day.  5. Pilocarpine ophthalmic solution 1 drop to right eye 4 times a day.  6. Acetaminophen and hydrocodone tablet take one-half to one tablet every 4 hours as needed for pain.  7. Metoprolol succinate 50 mg extended release tablet once a day.  8. Aspirin 81 mg once a day.  9. Colchicine 0.6 mg oral tablet once a day.  10. Gabapentin 300 mg 1 tablet 2 times a day.  11. Megestrol 40 mg/mL solution take 10 mL once a day.  12. Glycopyrrolate 1 mg/5 mL take 1 mL solution 3 times a day, take 1 hour before meal. 13. Zinc sulfate 220 mg oral capsule once a day.  14. Pantoprazole 40 mg delayed-release 2 times a day.  15. Multivitamin with minerals 1 tablet once a day.  16. Ensure Plus 4 times a day.   HOME HEALTH ON DISCHARGE: Yes.   HOME HEALTH SERVICES: Physical therapy and nurse.   DIET ON DISCHARGE: Low sodium.   DIET SUPPLEMENT: Ensure Plus 3 times a day.   DIET INSTRUCTIONS: Consistency regular and advised to encourage to drink more water.   FOLLOWUP: Advised to follow in 1 to 2 weeks with Dr. Gabriel Carina, endocrine clinic, for multinodular goiter, and follow up in ENT  clinic for persistent bad taste in the mouth, and also advised to discuss with the ophthalmologist about bad taste in the mouth and ask him if he can stop any of the eyedrops which are not strongly needed, and discuss with primary care physician about lung pseudotumor on CT and needs to follow up CT chest in a few months.   HISTORY OF PRESENTING ILLNESS: An 81 year old female with generalized weakness, lethargic and poor oral intake, brought by her daughter for not eating good for almost a month. Please see H and P done by Dr. Abel Presto on 23rd of July. The patient was treated for UTI recently, and for almost a month, she had been in ER in [place] and with her primary care doctor for repeated complaint of not eating well and getting dehydrated. So far, all the workup was negative by PMD and at [facility], and she was discharged once from here also from ER.   HOSPITAL COURSE AND STAY:  1. She came with SVT due to electrolyte imbalance and mild renal failure and so admitted for malnutrition, not eating well due to bad taste in the mouth. Given IV fluid, electrolytes corrected, monitored on telemetry, and she remained in sinus rhythm. Echocardiogram was done which showed diastolic chronic heart failure. Cardiology consult was done, denied any further workup. GI and ENT consults were done for her bad taste in the  mouth and increased salivation, and they did not find any significant finding except for mild stricture at gastroesophageal junction, which was dilated by ballooning by Dr. Allen Norris during endoscopy. The patient continued having complaint of this thing and was not eating well, but hydrated very well with IV fluids and given vitamins and supplements, and we finally thought this might be the complaint just because of some either underlying unknown malignancy, and to rule out that, we did CT of the neck which showed multinodular goiter with some calcifications. Spoke to Dr. Gabriel Carina. She advised to  follow in the clinic, and she might do sonogram as outpatient for thyroid gland.   OTHER MEDICAL ISSUES: 2. Urinary tract infection. She was recently treated for Klebsiella, and she was on ciprofloxacin. We continued ceftriaxone and we gave her 5 days in hospital.  3. Hypertension, hemodynamically stable, and we continued her medication.  4. Glaucoma. We continued her eyedrops which she was taking at home.  5. Dehydration due to poor oral intake. We gave IV fluids and corrected her hydration status, and she was feeling fine.   IMPORTANT LABORATORY DATA IN THE HOSPITAL: Hemoglobin 11.2, creatinine was 1.07 on presentation. Sodium was 134 and chloride was 3.5. Urinalysis was grossly negative for WBC and negative leukocyte esterase. Troponin 0.06. Chest x-ray, PA and lateral: No acute disease of the chest. Echocardiogram showed ejection fraction 55 to 60, impaired relaxation of left ventricular filling. Creatinine came to 0.78 later on in followup, and hemoglobin remained 9.2. Vitamin B12 normal. CT of the head: Normal. [place] color flow, upper extremity on the right side: Normal exam. X-ray of right hand: No fracture. There was a complaint of pain on that hand. Magnesium level 2. Hemoglobin at the time of discharge 8.4.   TOTAL TIME SPENT ON THIS DISCHARGE: 45 minutes.   ____________________________ Ceasar Lund Anselm Jungling, MD vgv:OSi D: 09/28/2012 13:11:21 ET T: 09/28/2012 13:46:19 ET JOB#: [id number]  cc: Ceasar Lund. Anselm Jungling, MD, <Dictator> A. Lavone Orn, MD Idalia Needle Chauncy Passy, MD Sammuel Hines. Richardson Landry, MD Vaughan Basta MD ELECTRONICALLY SIGNED 10/02/2012 23:23

## 2014-06-20 NOTE — Consult Note (Signed)
Brief Consult Note: Diagnosis: Dysguesia, Ptyalism.   Patient was seen by consultant.   Consult note dictated.   Comments: [name] is a very pleasant 81 y/o black female with 4 week history of failure to thrive, dysgeusia & ptyalism.  Second admission in past month with hx UTI, renal failure, CHF, gout, depression, weakness & dehydration.  She has also had 2 episodes of self-limited SVT with vagal maneuvers.  Denies dysphagia or odynophagia.  I will discuss her care with Dr Lucilla Lame.  She may benefit from EGD to determine if there is a component of GERD, bile reflux, erosive esophagitis, or stricture contributing to her symptoms.  Further recommendations to follow.  Addendum: EGD tomorrow.  Hold Lovenox for EGD.  Thanks for consult. Please see full dictated note.  [id number].  Electronic Signatures: Andria Meuse (NP)  (Signed 24-Jul-14 14:07)  Authored: Brief Consult Note   Last Updated: 24-Jul-14 14:07 by Andria Meuse (NP)

## 2014-06-20 NOTE — Op Note (Signed)
PATIENT NAME:  [name], [name] MR#:  [id number] DATE OF BIRTH:  1924-03-14  DATE OF PROCEDURE:    PREOPERATIVE DIAGNOSIS:  Poor appetite, foul taste in throat with pooling of salivary secretions.  POSTOPERATIVE DIAGNOSIS:  Poor appetite, foul taste in throat with pooling of salivary secretions.   PROCEDURE:  Flexible fiber-optic nasal laryngoscopy.   SURGEON:  Malon Kindle, M.D.   ANESTHESIA:  None.   INDICATIONS:  This patient has had difficulty with poor appetite, with question of dysphagia due to pooling of saliva in her mouth, although the family reports that she seems to be swallowing fine.  She just does not want to eat because of the foul taste in her throat.   DESCRIPTION OF PROCEDURE:  After discussing the procedure with the patient, the scope was gently passed through the right nasal cavity.  Nasal cavity is free of any purulence.  The nasopharynx was unremarkable with no mass, lesions.  Hypopharynx, larynx and tongue base were carefully inspected.  The vocal cords were clear and mobile.  There was no evidence of any pooling of secretions in the hypopharynx.  There were no lesions of the tongue base, epiglottis or piriform sinus.  The scope was withdrawn.  The patient tolerated the procedure well.    ____________________________ Sammuel Hines. Richardson Landry, MD psb:ea D: 09/21/2012 18:10:06 ET T: 09/22/2012 02:36:27 ET JOB#: [id number]  cc: Sammuel Hines. Richardson Landry, MD, <Dictator> Riley Nearing MD ELECTRONICALLY SIGNED 09/26/2012 7:28

## 2014-06-20 NOTE — Consult Note (Signed)
Chief Complaint:  Subjective/Chief Complaint Pt continues to c/o bitter taste.  Denies N/V or abdominal pain.   VITAL SIGNS/ANCILLARY NOTES: **Vital Signs.:   28-Jul-14 04:15  Vital Signs Type Routine  Temperature Temperature (F) 98.2  Celsius 36.7  Temperature Source oral  Pulse Pulse 71  Respirations Respirations 18  Systolic BP Systolic BP XX123456  Diastolic BP (mmHg) Diastolic BP (mmHg) 75  Mean BP 95  Pulse Ox % Pulse Ox % 98  Pulse Ox Activity Level  At rest  Oxygen Delivery Room Air/ 21 %    07:50  Vital Signs Type Routine  Temperature Temperature (F) 98.9  Celsius 37.1  Temperature Source oral  Pulse Pulse 65  Respirations Respirations 17  Systolic BP Systolic BP [id number]  Diastolic BP (mmHg) Diastolic BP (mmHg) 79  Mean BP 102  Pulse Ox % Pulse Ox % 98  Pulse Ox Activity Level  At rest  Oxygen Delivery Room Air/ 21 %   Brief Assessment:  GEN no acute distress, cachectic, thin, A/Ox3   Cardiac Regular   Respiratory normal resp effort   Gastrointestinal details normal Soft  Nontender  Nondistended  No masses palpable  Bowel sounds normal   EXTR negative cyanosis/clubbing   Additional Physical Exam Skin: warm, dry   Lab Results: Routine Hem:  28-Jul-14 10:49   WBC (CBC) 7.9  RBC (CBC)  3.00  Hemoglobin (CBC)  8.4  Hematocrit (CBC)  24.7  Platelet Count (CBC) 213  MCV 82  MCH 27.8  MCHC 33.9  RDW  15.6  Neutrophil % 77.5  Lymphocyte % 11.3  Monocyte % 5.2  Eosinophil % 5.2  Basophil % 0.8  Neutrophil # 6.1  Lymphocyte #  0.9  Monocyte # 0.4  Eosinophil # 0.4  Basophil # 0.1 (Result(s) reported on 24 Sep 2012 at 11:13AM.)   Assessment/Plan:  Assessment/Plan:  Assessment Dysguesia/anorexia:  EGD mild gastritis.  Bx pending.  No further GI work-up   Plan Continue PPI daily Will sign off, call if any further questions or problems.   Electronic Signatures: Andria Meuse (NP)  (Signed 28-Jul-14 11:21)  Authored: Chief Complaint, VITAL  SIGNS/ANCILLARY NOTES, Brief Assessment, Lab Results, Assessment/Plan   Last Updated: 28-Jul-14 11:21 by Andria Meuse (NP)

## 2014-06-20 NOTE — Consult Note (Signed)
PATIENT NAME:  Cassandra, Green MR#:  [id number] DATE OF BIRTH:  January 14, 1925  DATE OF CONSULTATION:  09/20/2012  REFERRING PHYSICIAN:   CONSULTING PHYSICIAN:  Andria Meuse, NP  PRIMARY CARE PHYSICIAN: Ebony Cargo, NP  REASON FOR CONSULTATION: Dysphagia, dysguesia.   HISTORY OF PRESENT ILLNESS: [name] is a very pleasant 81 year old black female who was admitted to the hospital with weakness, lethargy and failure to thrive. She complains of ptyalism after eating. She also perceives a bitter taste in her mouth at all times. She has been deconditioned lately. She has a very poor appetite over the past month. She denies any abdominal pain, heartburn, indigestion, dysphagia or odynophagia. She has been seen by a gastroenterologist, Dr. Britta Mccreedy, in [place]. Apparently, he did some lab work which showed that she was dehydrated and had acute renal failure back on 07/15, and she was sent to the hospital for IV fluids and antibiotics. She says her symptoms really have not improved. She denies any nausea or vomiting. She has never had an EGD. She did have 2 episodes of SVT that were resolved with Valsalva maneuvers. She is awaiting cardiology consult.   She denies any rectal bleeding or melena, denies any constipation or diarrhea. She did have a negative chest x-ray.   PAST MEDICAL AND SURGICAL HISTORY:  1.  Diabetes mellitus. 2.  Hypertension. 3.  Hyperlipidemia. 4.  Failure to thrive. 5.  Congestive heart failure. 6.  Glaucoma. 7.  Recent UTI and acute renal failure. 8.  GERD. 9.  Depression. 10.  Gout.  MEDICATIONS PRIOR TO ADMISSION:  1.  Acetaminophen/hydrocodone 325/5 mg 1/2 to 1 q. 4 hours p.r.n. pain.  2.  Aspirin 81 mg daily. 3.  Brimonidine ophthalmic 0.15% 1 drop to right eye b.i.d. 4.  Cipro 250 mg b.i.d. for 7 days. 5.  Colcrys 0.5 mg once daily. 6.  Cyclobenzaprine 10 mg t.i.d. 7.  Dorzolamide ophthalmic 2% solution 1 drop to right eye b.i.d.  8.  Fluocinolone  topical 0.01% cream apply to effected area daily. 9.  Hydrochlorothiazide 12.5 mg daily. 10.  Lantus insulin 8 units sub-Q at bedtime. 11.  Latanoprost ophthalmic 0.005% to right eye at bedtime. 12.  Lisinopril 40 mg daily. 13.  Lovastatin 40 mg at bedtime. 14.  Magnesium oxide 250 mg 2 tablets once daily. 15.  Metoprolol succinate 50 mg extended release daily. 16.  Omeprazole 20 mg daily. 17.  Pantoprazole 40 mg daily. 18.  Peridex 0.12% mucous membrane liquid 15 mL b.i.d. 19.  Pilocarpine ophthalmic 4% 1 drop 4 times daily. 20.   0.025% vaginal gel 0.5 once weekly. 21.  Vitamin D3 1000 international units 2 capsules daily.  ALLERGIES: METFORMIN, UNKNOWN REACTION.   FAMILY HISTORY: There is no known family history of colorectal carcinoma, liver or chronic GI problems. She did lose a son to coronary artery disease. Father had peptic ulcer disease. Mother had glaucoma and died from old age.   SOCIAL HISTORY: She denies any tobacco, alcohol or illicit drug use. She lives at home by herself but has family nearby. She has 10 grown healthy children. Her primary caregiver is her daughter, [name].   [place]:  See HPI. CONSTITUTIONAL: She has had weakness, fatigue and malaise.  MUSCULOSKELETAL: She has had right hand swelling, right wrist and right thumb pain that has worsened over the last several days. She has a history of gout. Otherwise negative 12 point review of systems.   PHYSICAL EXAMINATION: VITAL SIGNS: Temperature 98.4, pulse 75,  respirations 19, blood pressure 113/69, O2 sat 100% on 2 liters via nasal cannula.  GENERAL: She is a pleasant, elderly black female who is alert, oriented, pleasant and cooperative, in no acute distress. She is accompanied by her multiple children, grandchild.  HEENT: Sclerae clear, anicteric. Conjunctivae pink. Oropharynx pink and moist without any lesions. She does have upper dentures intact.  NECK: Supple without mass or thyromegaly.  CHEST:  Heart regular rate and rhythm. Normal S1, S2. She has a 3/6 murmur noted.  No clicks, rubs or gallops.  LUNGS: Clear to auscultation bilaterally.  ABDOMEN: Positive bowel sounds x 4. No bruits auscultated. Abdomen is soft, nontender and nondistended without palpable mass or hepatosplenomegaly. No rebound, tenderness or guarding.  EXTREMITIES: Without edema. Good cap refill. Good pulses.  SKIN: Warm and dry without any rash or jaundice.  NEUROLOGIC:  Grossly intact.  MUSCULOSKELETAL: She has good equal strength bilaterally.  PSYCHIATRIC:  She denies depression, anxiety. Normal mood and affect.   LABORATORY STUDIES: Glucose 155, potassium 3.2, chloride 108, otherwise normal met-7. LFTs normal on 07/23.  Troponin has been elevated x 3 and CK-MB have been elevated mildly as well. Total CK normal. White blood cell count 9.3, hemoglobin 9.2, hematocrit 27.1, platelets 202.   IMPRESSION: [name] is a very pleasant 81 year old black female with a 4 week history of failure to thrive, dysguesia and ptyalism. This her second hospital visit in the past month with history of urinary tract infection, acute renal failure, congestive heart failure, gout, depression, weakness and dehydration. She has also had 2 episodes of self-limited supraventricular tachycardia which resolves with vagal maneuvers. She denies dysphagia or odynophagia. I have discussed her care with Dr. Lucilla Lame. She is going to need EGD to look for erosive reflux esophagitis, bile reflux, stricture or mass contributing to her symptoms.   PLAN: 1.  EGD with Dr. Allen Norris tomorrow.  I have discussed the procedure including risks and benefits which include but are not limited to bleeding, infection, perforation and drug reaction. She agrees with plan and consent will be obtained.  2.  N.p.o. after midnight for the procedure.  3.  Agree with proton pump inhibitors.  4.  She should have cardiology clearance. 5.  Potassium repletion per attending.   6.  She should follow up with her right hand swelling and gout with hospitalist/PCP.  7.  Cardiology evaluation today for supraventricular tachycardia, elevated troponins.   This services provided by Andria Meuse, NP under collaborative agreement with Dr. Lucilla Lame. ____________________________ Andria Meuse, NP klj:sb D: 09/20/2012 14:06:40 ET T: 09/20/2012 14:29:26 ET JOB#: [id number]  cc: Andria Meuse, NP, <Dictator> Ebony Cargo, NP Andria Meuse FNP ELECTRONICALLY SIGNED 09/26/2012 15:13

## 2014-06-20 NOTE — Consult Note (Signed)
PATIENT NAME:  [name], CREMEANS MR#:  [id number] DATE OF BIRTH:  1924/08/21  DATE OF CONSULTATION:  09/21/2012  CONSULTING PHYSICIAN:  Sammuel Hines. Richardson Landry, MD  REQUESTING PHYSICIAN:  Dr. Verdell Carmine  CHIEF COMPLAINT:  Excessive pooling of saliva, poor appetite.   HISTORY OF PRESENT ILLNESS:  An 81 year old female who was admitted to the hospital with generalized weakness and poor p.o. intake for the past few weeks, with associated lethargy. Her family said she appears to be able to swallow; however, her appetite has worsened because of complaints of a bitter taste in her mouth and thick saliva in her throat. She recently was in the Emergency Room with dehydration, and later diagnosed with Klebsiella UTI, treated with ciprofloxacin. She is not having any fevers, throat pain or hoarseness. She has not had any nausea, vomiting, diarrhea, chest pain, shortness of breath or dizziness. Apparently when she was initially brought in the Emergency Room, she was in SVT, which responded to vagal maneuvers. She just underwent an upper GI endoscopy by Dr. Allen Norris earlier today, dilating a small esophageal stricture. Family says he indicated that he did not think that was causing her problem, however.   PAST MEDICAL HISTORY: Diabetes, hypertension, hyperlipidemia, history of congestive heart failure, glaucoma, reflux, depression and gout.   ALLERGIES:  METFORMIN.   SOCIAL HISTORY:  She is a nonsmoker, with no history of alcohol abuse or drug abuse. She lives at home by herself.   FAMILY HISTORY:  Mother had glaucoma, and died from old age. Father died in his 26s and had a history of peptic ulcer disease and colitis.   REVIEW OF SYSTEMS:  As above.   PHYSICAL EXAMINATION: VITAL SIGNS:  Temperature is 99.4, pulse 93, blood pressure is 126/72.  GENERAL EXAM: A thin, elderly female in no acute distress.  HEAD AND FACE EXAM:  Head is normocephalic, atraumatic. No facial skin lesions.  EAR EXAM:  External ears, ear canals,  tympanic membranes are clear bilaterally. There is no middle ear effusion or infection.  NASAL EXAM:  External nose unremarkable. Nasal cavity is clear. No purulence or polyps are seen. Septum is relatively straight.  ORAL CAVITY AND OROPHARYNX: Lips, gums, tongue, floor of mouth, posterior pharynx are free of erythema, exudate, swelling or other lesions.   NECK EXAM: The neck is supple, without adenopathy or mass. There is no thyromegaly. Salivary glands are soft and nontender, without masses.  NEUROLOGIC EXAM:  Cranial nerves II through XII are grossly intact.   PROCEDURE: Fiber-optic nasal laryngoscopy was performed. The hypopharynx, larynx and tongue base were carefully inspected. Vocal cords are clear and mobile, and there are no visible lesions in the hypopharynx or tongue base. There was no evidence of any pooling of secretions in the piriform sinus.   DATA REVIEW:  She had a CT of the head performed without contrast today that showed no evidence of any acute abnormality, although there is diffuse brain atrophy notable, likely consistent with chronic small vessel ischemic disease. I reviewed the scan myself, and there is no evidence of sinusitis to explain the bitter taste in her mouth. CBC reveals a normal white count with anemia and a hemoglobin of 8.   ASSESSMENT:  I can find no abnormalities to explain this patient's poor oral intake. The family seems to indicate that she swallows perfectly fine, and they feel like her difficulty eating is more related to the taste in her mouth. There is no evidence of thrush or bacterial infection, and no evidence of  sinusitis. Reflux may contribute to an abnormal taste. However, some other source of dysgeusia, such as a viral syndrome, cannot be completely excluded. She also has a history of depression, which could contribute to poor oral intake. If her oral intake does not improve, certainly a modified barium swallow and swallowing consult might be  considered just to make sure there is no functional dysphagia present. Whether the taste disturbance itself could be a complication of her recent antibiotic therapy is uncertain. Patients do sometimes report dysgeusia after antibiotic therapy. However, this is generally a temporary phenomenon, and I do not really see this as a common side effect with ciprofloxacin.    ____________________________ Sammuel Hines. Richardson Landry, MD psb:mr D: 09/21/2012 18:08:00 ET T: 09/21/2012 21:10:42 ET JOB#: [id number]  cc: Sammuel Hines. Richardson Landry, MD, <Dictator> Riley Nearing MD ELECTRONICALLY SIGNED 09/26/2012 7:28

## 2014-06-20 NOTE — Consult Note (Signed)
General Aspect 81 year old female who presents to the hospital with weakness, lethargy and poor p.o. intake.Noted to have narrow complex tachycardia, rate 160 bpm, resolvingKlebsiella UTI, on Ciprofloxacin. Cardiology was consulted for tachycardia, SOB, SVT.  she was unable to even get out of bed yesterday per the family.  Her biggest complaint is inabilty to swallow. She foams at teh mouth when trying to swallow any food. Because of this, she has not been eating and dropping her weight. The patient denies any fevers. She denies any abdominal pain, nausea, vomiting, diarrhea, any chest pain, any shortness of breath, any dizziness, any loss of consciousness or any other associated symptoms presently  In the ER, she was noted to be in SVT with heart rates in the 160s, which resolved just with vagal maneuvers. She was noted to have an elevated troponin of 0.06   Present Illness . SOCIAL HISTORY: No smoking. No alcohol abuse. No illicit drug abuse. Lives at home by herself.   FAMILY HISTORY: Mother had glaucoma and died from old age. Father died in his 75s from possibly colitis and peptic ulcer disease.   Physical Exam:  GEN well developed, well nourished, no acute distress   HEENT hearing intact to voice, moist oral mucosa   NECK supple   RESP normal resp effort  clear BS   CARD Regular rate and rhythm  Murmur   Murmur Systolic   Systolic Murmur Out flow   ABD denies tenderness  soft   EXTR negative edema   SKIN normal to palpation   NEURO motor/sensory function intact   PSYCH alert, A+O to time, place, person, good insight   Review of Systems:  Subjective/Chief Complaint Fatigue, trouble swallow   General: Fatigue   Skin: No Complaints   ENT: No Complaints   Eyes: No Complaints   Neck: No Complaints   Respiratory: No Complaints   Cardiovascular: No Complaints   Gastrointestinal: foaming at the mouth with swallowing   Genitourinary: No Complaints   Vascular:  No Complaints   Musculoskeletal: No Complaints   Neurologic: No Complaints   Hematologic: No Complaints   Endocrine: No Complaints   Psychiatric: No Complaints   Review of Systems: All other systems were reviewed and found to be negative   Medications/Allergies Reviewed Medications/Allergies reviewed     Trigeminal Neuralgia:    Hyperlipidemia:    Glaucoma:    Prolapsed Uterus:    Gout:    CHF:    Renal failure:    Diabetes Mellitus, Type II (NIDD):    HTN:        Admit Diagnosis:   DEHYDRATION.: Onset Date: 20-Sep-2012, Status: Active, Description: DEHYDRATION.      Admit Reason:   Supraventricular tachycardia (427.89): Status: Active, Coding System: ICD9, Coded Name: Other specified cardiac dysrhythmias  Home Medications: Medication Instructions Status  Cipro 250 mg oral tablet 1 tab(s) orally every 12 hours for 7 days Active  Lantus 100 units/mL subcutaneous solution 8 unit(s) subcutaneous once a day (at bedtime) (8 pm) Active  dorzolamide ophthalmic 2% ophthalmic solution 1 drop(s) to right eye 2 times a day (8 am, 8 pm) Active  Trimo-San 0.025% vaginal gel with applicator 0.5 application vaginal once a week on Tuesday after douching (8 pm) Active  fluocinolone topical 0.01% topical cream Apply topically to affected area once a day, As Needed after bath Active  metoprolol succinate 50 mg oral tablet, extended release 1 tab(s) orally once a day (8 am) Active  Aspirin Low Dose 81 mg  oral delayed release tablet 1 tab(s) orally once a day (8 am) Active  omeprazole 20 mg oral delayed release capsule 1 cap(s) orally once a day (8 am) Active  lisinopril 40 mg oral tablet 1 tab(s) orally once a day (8 am) Active  Colcrys 0.6 mg oral tablet 1 tab(s) orally once a day (8 am) Active  hydrochlorothiazide 12.5 mg oral capsule 1 cap(s) orally once a day for blood pressure (8 am) Active  lovastatin 40 mg oral tablet 1 tab(s) orally once a day (at bedtime) (8 pm) Active   magnesium oxide 250 mg oral tablet 2 tab(s) orally once a day (8 am) Active  Vitamin D3 1000 intl units oral capsule 2 cap(s) orally once a day (8 am) Active  latanoprost ophthalmic 0.005% ophthalmic solution 1 drop(s) to right eye once a day (at bedtime) (8 pm) Active  pilocarpine ophthalmic 4% ophthalmic solution 1 drop(s) to right eye 4 times a day (8 am, 12 pm, 4 pm, 8 pm) Active  brimonidine ophthalmic 0.15% ophthalmic solution 1 drop(s) to right eye 2 times a day (8 am, 8 pm) Active  acetaminophen-HYDROcodone 325 mg-5 mg oral tablet 0.5-1 tab(s) orally every 4 hours, As Needed - for Pain Active  cyclobenzaprine 10 mg oral tablet 1 tab(s) orally 3 times a day, As Needed for muscle spasms Active  Peridex 0.12% mucous membrane liquid 15 milliliter(s) mucous membrane 2 times a day (8 am, 8 pm) Active  pantoprazole 40 mg oral delayed release tablet 1 tab(s) orally once a day (8 am) Active   Lab Results:  Routine Chem:  24-Jul-14 01:10   Result Comment TROPONIN - RESULTS VERIFIED BY REPEAT TESTING.  - PREV. C/ 09-19-12 _0  BY DAC--AJO  Result(s) reported on 20 Sep 2012 at 02:11AM.  Glucose, Serum  155  BUN 12  Creatinine (comp) 0.78  Sodium, Serum 139  Potassium, Serum  3.2  Chloride, Serum  108  CO2, Serum 24  Calcium (Total), Serum 8.6  Anion Gap 7  Osmolality (calc) 280  eGFR (African American) >60  eGFR (Non-African American) >60 (eGFR values <76m/min/1.73 m2 may be an indication of chronic kidney disease (CKD). Calculated eGFR is useful in patients with stable renal function. The eGFR calculation will not be reliable in acutely ill patients when serum creatinine is changing rapidly. It is not useful in  patients on dialysis. The eGFR calculation may not be applicable to patients at the low and high extremes of body sizes, pregnant women, and vegetarians.)  Cardiac:  24-Jul-14 01:10   CK, Total 70  CPK-MB, Serum  5.1 (Result(s) reported on 20 Sep 2012 at 02:07AM.)   Troponin I  0.10 (0.00-0.05 0.05 ng/mL or less: NEGATIVE  Repeat testing in 3-6 hrs  if clinically indicated. >0.05 ng/mL: POTENTIAL  MYOCARDIAL INJURY. Repeat  testing in 3-6 hrs if  clinically indicated. NOTE: An increase or decrease  of 30% or more on serial  testing suggests a  clinically important change)  Routine Hem:  24-Jul-14 01:10   WBC (CBC) 9.3  RBC (CBC)  3.24  Hemoglobin (CBC)  9.2  Hematocrit (CBC)  27.1  Platelet Count (CBC) 202  MCV 84  MCH 28.4  MCHC 34.0  RDW  15.8  Neutrophil % 80.5  Lymphocyte % 8.2  Monocyte % 10.4  Eosinophil % 0.5  Basophil % 0.4  Neutrophil #  7.5  Lymphocyte #  0.8  Monocyte #  1.0  Eosinophil # 0.0  Basophil # 0.0 (Result(s) reported on 20 Sep 2012 at 01:47AM.)   EKG:  Interpretation EKG shows SVt with rate 160 bpm, follow up EKG with NSR with rate 98 bpm, nonspcific ST ABN, LAD   Radiology Results: XRay:    23-Jul-14 09:41, Chest Portable Single View  Chest Portable Single View   REASON FOR EXAM:    sob  COMMENTS:       PROCEDURE: DXR - DXR PORTABLE CHEST SINGLE VIEW  - Sep 19 2012  9:41AM     RESULT: Comparison: None    Findings:     Single portable AP chest radiograph is provided.  There is no focal   parenchymal opacity, pleural effusion, or pneumothorax. Normal   cardiomediastinal silhouette. The osseous structures are unremarkable.    IMPRESSION:   No acute disease of the chest.    Dictation Site: 1        Verified By: Jennette Banker, M.D., MD    Metformin: Unknown  Vital Signs/Nurse's Notes: **Vital Signs.:   24-Jul-14 15:38  Vital Signs Type Routine  Temperature Temperature (F) 98.1  Celsius 36.7  Temperature Source oral  Pulse Pulse 71  Respirations Respirations 18  Systolic BP Systolic BP 419  Diastolic BP (mmHg) Diastolic BP (mmHg) 67  Mean BP 82  Pulse Ox % Pulse Ox % 100  Pulse Ox Activity Level  At rest  Oxygen Delivery 2L    Impression 81 year old female who presents to the  hospital with weakness, lethargy and poor p.o. intake.Noted to have narrow complex tachycardia, rate 160 bpm, resolvingKlebsiella UTI, on Ciprofloxacin. Cardiology was consulted for tachycardia, SOB, SVT.  1) SVT: Several episodes, none in the past 24 hours on toprol 50 mg daily Suspect exacerbation from underlying UTI, dehydration. --Would replete potassium. --If she has additional epsiodes, could hold metoprolol and start diltiazem 120 mg daily. (Other alternative would be amiodarone 200 mg po BID)  2) .  Dehydration.   poor p.o. intake from inability to swallow well. GI EGD tomorrow.  3.  Generalized weakness.   dehydration, underlying urinary tract infection. Poor PO intake  4).  Urinary tract infection.  Klebsiella urinary tract infection, which was diagnosed on 09/11/2012. U/A improved  4.  Hypertension.  on Toprol     5) .  Diabetes.  Management per medical service   Electronic Signatures: Ida Rogue (MD)  (Signed 24-Jul-14 18:37)  Authored: General Aspect/Present Illness, History and Physical Exam, Review of System, Past Medical History, Health Issues, Home Medications, Labs, EKG , Radiology, Allergies, Vital Signs/Nurse's Notes, Impression/Plan   Last Updated: 24-Jul-14 18:37 by Ida Rogue (MD)

## 2014-06-20 NOTE — Consult Note (Signed)
PATIENT NAME:  [name], BURGERT MR#:  [id number] DATE OF BIRTH:  [birth year]  DATE OF CONSULTATION:  09/25/2012  REFERRING PHYSICIAN:   CONSULTING PHYSICIAN:  Ashlen Kiger S. Mitcheal Sweetin, MD  REASON FOR CONSULTATION: Complaining of bitter taste in the mouth, has loss of appetite, rule out depression.   HISTORY OF PRESENT ILLNESS: The patient is a 81 year old female who presented to the hospital due to generalized weakness, lethargy and poor intake. She was accompanied by her 2 daughters. Psychiatric consult was placed as the patient has been losing weight and was unable to eat due to a bitter taste in her mouth for the past couple of months. The patient also came to the ER in the middle of July and she was diagnosed with dehydration and was given some fluids and discharged back. Her family brought her back as the patient continues to have weakness with poor intake. She reported that she has a bitter taste in her mouth and nothing feels good. She has lost some weight. The patient was noted to have SVT with a heart rate in the 160s when she presented to the ER.  It resolved with some vagal maneuvers.  During my interview, the patient was noted to be lying in the bed with her daughters present on her sides.  They reported that the patient lives by herself. She is a very healthy person and she loves to eat and enjoys cooking.  They reported that there is always food around her. She is a very active person and she will wake up early in the morning and will enjoy some time in her garden. However, because of increased salivation and some foamy feeling in her mouth she has not been able to eat well. The patient reported that nothing tastes good, even water or juice will not feel well. She has been trying to eat different types of food, but she has been unsuccessful. She has been progressively gaining weight. Since she has been admitted to the hospital they have done multiple tests including out for GERD and EGD has been done,  but they are coming back negative. Her daughters were concerned about the reasons for her bitter taste in the mouth. She was also recently started on Megace, which has been somewhat successful and her appetite has increased over the past couple of days. The patient was initially very dehydrated, and she was given IV fluids, which has helped. Her skin color has started looking better. Her daughters were concerned that after she will return home and if she becomes dehydrated then they will not be able to provide her as much fluid as she received in the hospital. The patient reported that she does not feel depressed, does not have any anxiety or paranoia at this time. She refused that she does not feel as if anybody is trying to poison her or there is anything mixed in her food. She reported that she does not have any swallowing difficulty. She does not have any paranoia that somebody is after her or is trying to harm her at this time. She denied having any feelings of depression, anxiety or having any thoughts to harm herself.   PAST PSYCHIATRIC HISTORY: The patient reported that she has never seen a psychiatrist in the past, and she was started on Zoloft after the death of her husband and her younger son. She has taken Zoloft for the past 5 years and the dose was gradually decreased to 50 mg recently. She usually follows with her primary  care physician who can refill her Zoloft. Her doctors have thought that she might not need any Zoloft and they were trying to wean her off of the medication. She has never tried any other psychotropic medications.   MEDICAL HISTORY: Diabetes, hypertension, hyperlipidemia, congestive heart failure, glaucoma, GERD, depression and gout.   ALLERGIES: METFORMIN.   SOCIAL HISTORY: The patient currently lives by herself. She has her daughters who are very supportive.   FAMILY HISTORY: Glaucoma in her mother. Her father passed away due to colitis and peptic ulcer disease.   VITAL  SIGNS: Temperature 98.1, pulse 94, respirations 18, blood pressure 122/68.  LABORATORY DATA:  Glucose 202, BUN 7, creatinine 0.62, sodium 140, potassium 3.9, chloride 111, bicarbonate 23, anion gap 6, osmolality 278, calcium 8.5. Magnesium 2.0.  WBC 7.9, RBC 3.0, hemoglobin 8.4, hematocrit 24.7, platelet count 213, MCV 82.   MENTAL STATUS EXAMINATION: The patient is a thinly built female who was lying in the bed. She maintained fair eye contact. Her speech was low in tone and volume. Mood was depressed. Affect was blunted. Thought process was logical, goal-directed. Thought content was nondelusional. She currently denied having any suicidal or homicidal ideations or plans. No paranoia was noted.   REVIEW OF SYSTEMS: CONSTITUTIONAL: No fever or chills. Has lost some weight.  EYES: No double or blurred vision.  RESPIRATORY: No shortness of breath or cough.  CARDIOVASCULAR: Denies any chest pain or orthopnea.  GASTROINTESTINAL: No abdominal pain, nausea, vomiting, diarrhea. GENITOURINARY: No incontinence or frequency.  ENDOCRINE: No heat or cold intolerance.  MUSCULOSKELETAL: Denies having any muscle or joint pain.   DIAGNOSTIC IMPRESSION: AXIS I: Depressive disorder, not otherwise specified.  AXIS II: None.  AXIS III: None.   TREATMENT PLAN:  1.  I discussed with the patient and her daughters at length about the medications, treatment risks, benefits and alternatives.  2.  The patient will be discharged today and the daughters reported that they have already decreased the dose of the Zoloft to 50 mg at this time as they feel that she should not be on any psychotropic medications. They will discuss with her outpatient primary care physician about the discontinuation of the Zoloft. The patient has noticed some improvement with addition of the Megace.  No new psychotropic medication will be prescribed at this time. She will follow up with her primary care physician as an outpatient.   Thank you  for allowing me to participate in the care of this patient.  ____________________________ Cordelia Pen. Gretel Acre, MD usf:sb D: 09/25/2012 14:13:42 ET T: 09/25/2012 14:28:46 ET JOB#: [id number]  cc: Cordelia Pen. Gretel Acre, MD, <Dictator> Jeronimo Norma MD ELECTRONICALLY SIGNED 10/02/2012 12:35

## 2014-07-19 ENCOUNTER — Emergency Department: Payer: Medicare HMO

## 2014-07-19 ENCOUNTER — Emergency Department
Admission: EM | Admit: 2014-07-19 | Discharge: 2014-07-19 | Disposition: A | Discharge status: Home / Self Care | Payer: Medicare HMO | Attending: Emergency Medicine | Admitting: Emergency Medicine

## 2014-07-19 ENCOUNTER — Other Ambulatory Visit: Payer: Self-pay

## 2014-07-19 ENCOUNTER — Emergency Department: Admission: EM | Admit: 2014-07-19 | Discharge: 2014-07-19 | Discharge status: Absent without leave | Payer: Self-pay

## 2014-07-19 ENCOUNTER — Encounter: Payer: Self-pay | Admitting: Emergency Medicine

## 2014-07-19 DIAGNOSIS — R531 Weakness: Secondary | ICD-10-CM | POA: Diagnosis present

## 2014-07-19 DIAGNOSIS — I10 Essential (primary) hypertension: Secondary | ICD-10-CM | POA: Diagnosis not present

## 2014-07-19 DIAGNOSIS — E119 Type 2 diabetes mellitus without complications: Secondary | ICD-10-CM | POA: Insufficient documentation

## 2014-07-19 DIAGNOSIS — Z7982 Long term (current) use of aspirin: Secondary | ICD-10-CM | POA: Insufficient documentation

## 2014-07-19 DIAGNOSIS — Z79899 Other long term (current) drug therapy: Secondary | ICD-10-CM | POA: Diagnosis not present

## 2014-07-19 LAB — CBC
HEMATOCRIT: 38.8 % (ref 35.0–47.0)
Hemoglobin: 12.9 g/dL (ref 12.0–16.0)
MCH: 30.4 pg (ref 26.0–34.0)
MCHC: 33.3 g/dL (ref 32.0–36.0)
MCV: 91.3 fL (ref 80.0–100.0)
PLATELETS: 234 10*3/uL (ref 150–440)
RBC: 4.25 MIL/uL (ref 3.80–5.20)
RDW: 14 % (ref 11.5–14.5)
WBC: 6.9 10*3/uL (ref 3.6–11.0)

## 2014-07-19 LAB — URINALYSIS COMPLETE WITH MICROSCOPIC (ARMC ONLY)
Bilirubin Urine: NEGATIVE
Hgb urine dipstick: NEGATIVE
Ketones, ur: NEGATIVE mg/dL
LEUKOCYTES UA: NEGATIVE
Nitrite: NEGATIVE
Protein, ur: NEGATIVE mg/dL
SPECIFIC GRAVITY, URINE: 1.006 (ref 1.005–1.030)
pH: 7 (ref 5.0–8.0)

## 2014-07-19 LAB — BASIC METABOLIC PANEL
ANION GAP: 7 (ref 5–15)
BUN: 14 mg/dL (ref 6–20)
CHLORIDE: 102 mmol/L (ref 101–111)
CO2: 29 mmol/L (ref 22–32)
CREATININE: 0.98 mg/dL (ref 0.44–1.00)
Calcium: 9.5 mg/dL (ref 8.9–10.3)
GFR calc non Af Amer: 50 mL/min — ABNORMAL LOW (ref >60)
GFR, EST AFRICAN AMERICAN: 58 mL/min — AB (ref >60)
GLUCOSE: 313 mg/dL — AB (ref 65–99)
Potassium: 4.4 mmol/L (ref 3.5–5.1)
Sodium: 138 mmol/L (ref 135–145)

## 2014-07-19 MED ORDER — SODIUM CHLORIDE 0.9 % IV BOLUS (SEPSIS)
500.0000 mL | Freq: Once | INTRAVENOUS | Status: AC
  Administered 2014-07-19: 500 mL via INTRAVENOUS

## 2014-07-19 NOTE — ED Provider Notes (Signed)
[facility] Emergency Department Provider Note  ____________________________________________  Time seen: Approximately 6:30 PM  I have reviewed the triage vital signs and the nursing notes.   HISTORY  Chief Complaint Weakness    HPI Kaylynn Barton Dubois is a 81 y.o. female presents to the emergency department with her family. Patient denies complaints. Family reports that the patient has been less active and more quiet than usual. They deny any other specific abnormal behavior. They report similar symptoms 2 years ago when she was diagnosed with urinary tract infection. They deny any recent illness or need to see the primary care provider. There've been no changes to any of her medications. There is been no vomiting or diarrhea. Appetite is about the same as always.   Past Medical History  Diagnosis Date  . Hypertension   . Hyperlipidemia   . Diabetes mellitus without complication   . CHF (congestive heart failure)   . GERD (gastroesophageal reflux disease)   . Glaucoma   . Heart murmur   . Depression   . Gout   . Hiatal hernia   . Bone disease   . Anemia   . Aortic stenosis   . Cervical spine arthritis   . Vitamin deficiency   . Cognitive impairment     Patient Active Problem List   Diagnosis Date Noted  . SVT (supraventricular tachycardia) 12/17/2012  . Diabetes mellitus, type 2 12/17/2012  . SOB (shortness of breath) 12/17/2012  . Essential hypertension, malignant 12/17/2012  . Chronic diastolic CHF (congestive heart failure) 12/17/2012    Past Surgical History  Procedure Laterality Date  . Tubal ligation      Current Outpatient Rx  Name  Route  Sig  Dispense  Refill  . aspirin 81 MG tablet   Oral   Take 81 mg by mouth daily.         . brimonidine (ALPHAGAN P) 0.1 % SOLN   Right Eye   Place 1 drop into the right eye 2 (two) times daily.         . brimonidine (ALPHAGAN) 0.15 % ophthalmic solution   Right Eye   Place 1 drop into  the right eye 2 (two) times daily.         . cholecalciferol (VITAMIN D) 1000 UNITS tablet   Oral   Take 2,000 Units by mouth daily.          . clotrimazole (LOTRIMIN) 1 % cream   Topical   Apply 1 application topically as needed.         . colchicine 0.6 MG tablet   Oral   Take 0.6 mg by mouth daily.         . cyclobenzaprine (FLEXERIL) 10 MG tablet   Oral   Take 10 mg by mouth 3 (three) times daily as needed for muscle spasms.         . Dorzolamide HCl-Timolol Mal (COSOPT OP)   Ophthalmic   Apply to eye 2 (two) times daily.         Marland Kitchen gabapentin (NEURONTIN) 300 MG capsule   Oral   Take 300 mg by mouth 2 (two) times daily.         Marland Kitchen glimepiride (AMARYL) 1 MG tablet   Oral   Take 1 mg by mouth daily with breakfast.         . glycopyrrolate (ROBINUL) 1 MG tablet   Oral   Take by mouth. Takes 1/4 tab;et three times daily.         Marland Kitchen  HYDROcodone-acetaminophen (NORCO/VICODIN) 5-325 MG per tablet   Oral   Take 1 tablet by mouth every 6 (six) hours as needed for pain.         . iron polysaccharides (NIFEREX) 150 MG capsule   Oral   Take 150 mg by mouth 2 (two) times daily.         Marland Kitchen lisinopril (PRINIVIL,ZESTRIL) 40 MG tablet      TAKE 1 TABLET BY MOUTH ONCE DAILY (8AM)   90 tablet   3   . lovastatin (MEVACOR) 40 MG tablet   Oral   Take 40 mg by mouth at bedtime.         . Magnesium 250 MG TABS   Oral   Take 2 tablets by mouth daily.         . metoprolol succinate (TOPROL-XL) 50 MG 24 hr tablet   Oral   Take 50 mg by mouth daily. Take with or immediately following a meal.         . pantoprazole (PROTONIX) 40 MG tablet   Oral   Take 40 mg by mouth 2 (two) times daily.         . pilocarpine (PILOCAR) 4 % ophthalmic solution   Right Eye   Place 1 drop into the right eye 4 (four) times daily.            Allergies Metformin and related  Family History  Problem Relation Age of Onset  . Glaucoma Mother   . Peptic Ulcer Father    . Colitis Father     Social History History  Substance Use Topics  . Smoking status: Never Smoker   . Smokeless tobacco: Not on file  . Alcohol Use: No    Review of Systems Constitutional: No fever/chills Eyes: No visual changes. ENT: No sore throat. Cardiovascular: Denies chest pain. Respiratory: Denies shortness of breath. Gastrointestinal: No abdominal pain.  No nausea, no vomiting.  No diarrhea.  No constipation. Genitourinary: Negative for dysuria. Musculoskeletal: Negative for back pain. Skin: Negative for rash. Neurological: Negative for headaches, focal weakness or numbness. Psychiatric: Quiet and less talkative per family 10-point ROS otherwise negative.  ____________________________________________   PHYSICAL EXAM:  VITAL SIGNS: ED Triage Vitals  Enc Vitals Group     BP 07/19/14 1458 130/73 mmHg     Pulse Rate 07/19/14 1458 93     Resp 07/19/14 1458 18     Temp 07/19/14 1458 98 F (36.7 C)     Temp Source 07/19/14 1458 Oral     SpO2 07/19/14 1458 96 %     Weight 07/19/14 1458 152 lb (68.947 kg)     Height 07/19/14 1458 5\' 2"  (1.575 m)     Head Cir --      Peak Flow --      Pain Score 07/19/14 1941 5     Pain Loc --      Pain Edu? --      Excl. in [place]? --     Constitutional: Alert and oriented. Well appearing and in no acute distress. Eyes: Conjunctivae are normal. PERRL. EOMI. Head: Atraumatic. Nose: No congestion/rhinnorhea. Mouth/Throat: Mucous membranes are moist.  Oropharynx non-erythematous. Neck: No stridor.   Hematological/Lymphatic/Immunilogical: No cervical lymphadenopathy. Cardiovascular: Normal rate, regular rhythm.  Good peripheral circulation. Respiratory: Normal respiratory effort.  No retractions. Lungs CTAB. Gastrointestinal: Soft and nontender. No distention. No abdominal bruits. No CVA tenderness. Musculoskeletal: No lower extremity tenderness nor edema. Neurologic:  Normal speech and language. No gross focal  neurologic  deficits are appreciated. Speech is normal. No gait instability. NIH score is 0. Skin:  Skin is warm, dry and intact. No rash noted. Psychiatric: Mood and affect do not appear concerning. Speech is normal with appropriate response to questions.  ____________________________________________   LABS (all labs ordered are listed, but only abnormal results are displayed)  Labs Reviewed  BASIC METABOLIC PANEL - Abnormal; Notable for the following:    Glucose, Bld 313 (*)    GFR calc non Af Amer 50 (*)    GFR calc Af Amer 58 (*)    All other components within normal limits  URINALYSIS COMPLETEWITH MICROSCOPIC ([facility])  - Abnormal; Notable for the following:    Color, Urine YELLOW (*)    APPearance CLEAR (*)    Glucose, UA >500 (*)    Bacteria, UA RARE (*)    Squamous Epithelial / LPF 6-30 (*)    All other components within normal limits  URINE CULTURE  CBC   ____________________________________________  EKG  Normal sinus rhythm  EKG at 1508, this EKG was seen and interpreted by me Francene Castle, M.D.) Normal sinus rhythm at a rate of 92. Normal intervals. Left axis deviation -42. Evidence of left ventricular hypertrophy. ____________________________________________  RADIOLOGY  Chest x-ray negative for infiltrate or acute cardiopulmonary illness. ____________________________________________   PROCEDURES  Procedure(s) performed: None  Critical Care performed: No  ____________________________________________   INITIAL IMPRESSION / ASSESSMENT AND PLAN / ED COURSE  Pertinent labs & imaging results that were available during my care of the patient were reviewed by me and considered in my medical decision making (see chart for details).  Labs, urinalysis, and chest x-ray reviewed with patient and family. I will send urine for culture to reassure family that this is not an early urinary tract infection. Return precautions were discussed. She will follow up with her primary  care provider early in the week. Patient continued to complaints upon disposition. ____________________________________________   FINAL CLINICAL IMPRESSION(S) / ED DIAGNOSES  Final diagnoses:  Generalized weakness   hyperglycemia    Victorino Dike, FNP 07/19/14 2143  This patient was treated primarily by Ms Vanessa [name].  I was available for supervision and consultation. I viewed the EKG and interpreted it area.   Francene Castle, M.D., [facility]  Ahmed Prima, MD 07/19/14 2152

## 2014-07-19 NOTE — ED Notes (Signed)
Patient with no complaints at this time. Respirations even and unlabored. Skin warm/dry. Discharge instructions reviewed with patient at this time. Patient given opportunity to voice concerns/ask questions. IV removed per policy and band-aid applied to site. Patient discharged at this time and left Emergency Department with steady gait.  

## 2014-07-19 NOTE — ED Notes (Signed)
Patient to ED with family who reports patient has been experiencing increasing weakness and not being as talkative over the last 5-6 days. Patient is quiet in triage but does deny pain or urinary symptoms. Family reports she had UTI in past and symptoms were similar.

## 2014-07-21 LAB — URINE CULTURE

## 2014-07-23 DIAGNOSIS — Z0000 Encounter for general adult medical examination without abnormal findings: Secondary | ICD-10-CM | POA: Insufficient documentation

## 2014-08-27 ENCOUNTER — Observation Stay
Admission: EM | Admit: 2014-08-27 | Discharge: 2014-08-29 | Disposition: A | Discharge status: Home / Self Care | Payer: Medicare HMO | Attending: Internal Medicine | Admitting: Internal Medicine

## 2014-08-27 DIAGNOSIS — E119 Type 2 diabetes mellitus without complications: Secondary | ICD-10-CM | POA: Insufficient documentation

## 2014-08-27 DIAGNOSIS — N1330 Unspecified hydronephrosis: Secondary | ICD-10-CM | POA: Insufficient documentation

## 2014-08-27 DIAGNOSIS — M109 Gout, unspecified: Secondary | ICD-10-CM | POA: Insufficient documentation

## 2014-08-27 DIAGNOSIS — F329 Major depressive disorder, single episode, unspecified: Secondary | ICD-10-CM | POA: Diagnosis not present

## 2014-08-27 DIAGNOSIS — I471 Supraventricular tachycardia: Secondary | ICD-10-CM | POA: Insufficient documentation

## 2014-08-27 DIAGNOSIS — I10 Essential (primary) hypertension: Secondary | ICD-10-CM | POA: Diagnosis not present

## 2014-08-27 DIAGNOSIS — R410 Disorientation, unspecified: Secondary | ICD-10-CM | POA: Diagnosis not present

## 2014-08-27 DIAGNOSIS — E860 Dehydration: Secondary | ICD-10-CM

## 2014-08-27 DIAGNOSIS — I350 Nonrheumatic aortic (valve) stenosis: Secondary | ICD-10-CM | POA: Insufficient documentation

## 2014-08-27 DIAGNOSIS — D649 Anemia, unspecified: Secondary | ICD-10-CM | POA: Diagnosis not present

## 2014-08-27 DIAGNOSIS — R0602 Shortness of breath: Secondary | ICD-10-CM | POA: Insufficient documentation

## 2014-08-27 DIAGNOSIS — N179 Acute kidney failure, unspecified: Principal | ICD-10-CM | POA: Insufficient documentation

## 2014-08-27 DIAGNOSIS — N8110 Cystocele, unspecified: Secondary | ICD-10-CM | POA: Diagnosis not present

## 2014-08-27 DIAGNOSIS — Z8673 Personal history of transient ischemic attack (TIA), and cerebral infarction without residual deficits: Secondary | ICD-10-CM | POA: Diagnosis not present

## 2014-08-27 DIAGNOSIS — I9589 Other hypotension: Secondary | ICD-10-CM | POA: Diagnosis present

## 2014-08-27 DIAGNOSIS — E785 Hyperlipidemia, unspecified: Secondary | ICD-10-CM | POA: Diagnosis not present

## 2014-08-27 DIAGNOSIS — R4182 Altered mental status, unspecified: Secondary | ICD-10-CM | POA: Diagnosis not present

## 2014-08-27 DIAGNOSIS — I639 Cerebral infarction, unspecified: Secondary | ICD-10-CM | POA: Insufficient documentation

## 2014-08-27 DIAGNOSIS — Z888 Allergy status to other drugs, medicaments and biological substances status: Secondary | ICD-10-CM | POA: Insufficient documentation

## 2014-08-27 DIAGNOSIS — Z8489 Family history of other specified conditions: Secondary | ICD-10-CM | POA: Insufficient documentation

## 2014-08-27 DIAGNOSIS — Z79899 Other long term (current) drug therapy: Secondary | ICD-10-CM | POA: Insufficient documentation

## 2014-08-27 DIAGNOSIS — I959 Hypotension, unspecified: Secondary | ICD-10-CM | POA: Diagnosis not present

## 2014-08-27 DIAGNOSIS — Z7982 Long term (current) use of aspirin: Secondary | ICD-10-CM | POA: Insufficient documentation

## 2014-08-27 DIAGNOSIS — K219 Gastro-esophageal reflux disease without esophagitis: Secondary | ICD-10-CM | POA: Insufficient documentation

## 2014-08-27 DIAGNOSIS — F0390 Unspecified dementia without behavioral disturbance: Secondary | ICD-10-CM | POA: Diagnosis not present

## 2014-08-27 DIAGNOSIS — G3184 Mild cognitive impairment, so stated: Secondary | ICD-10-CM | POA: Diagnosis not present

## 2014-08-27 DIAGNOSIS — N289 Disorder of kidney and ureter, unspecified: Secondary | ICD-10-CM | POA: Diagnosis not present

## 2014-08-27 DIAGNOSIS — K449 Diaphragmatic hernia without obstruction or gangrene: Secondary | ICD-10-CM | POA: Insufficient documentation

## 2014-08-27 DIAGNOSIS — Z83511 Family history of glaucoma: Secondary | ICD-10-CM | POA: Diagnosis not present

## 2014-08-27 DIAGNOSIS — I5032 Chronic diastolic (congestive) heart failure: Secondary | ICD-10-CM | POA: Diagnosis not present

## 2014-08-27 DIAGNOSIS — R197 Diarrhea, unspecified: Secondary | ICD-10-CM | POA: Diagnosis present

## 2014-08-27 DIAGNOSIS — E569 Vitamin deficiency, unspecified: Secondary | ICD-10-CM | POA: Diagnosis not present

## 2014-08-27 DIAGNOSIS — Z8379 Family history of other diseases of the digestive system: Secondary | ICD-10-CM | POA: Insufficient documentation

## 2014-08-27 DIAGNOSIS — R109 Unspecified abdominal pain: Secondary | ICD-10-CM | POA: Insufficient documentation

## 2014-08-27 DIAGNOSIS — Z794 Long term (current) use of insulin: Secondary | ICD-10-CM | POA: Diagnosis not present

## 2014-08-27 NOTE — ED Notes (Signed)
Patient ambulatory to triage with steady gait, without difficulty or distress noted; pt to triage via w/c with no distress noted; family reports pt with diarrhea several days, confusion

## 2014-08-28 ENCOUNTER — Encounter: Payer: Self-pay | Admitting: Radiology

## 2014-08-28 ENCOUNTER — Emergency Department: Payer: Medicare HMO

## 2014-08-28 DIAGNOSIS — N1330 Unspecified hydronephrosis: Secondary | ICD-10-CM | POA: Insufficient documentation

## 2014-08-28 DIAGNOSIS — N8110 Cystocele, unspecified: Secondary | ICD-10-CM | POA: Insufficient documentation

## 2014-08-28 DIAGNOSIS — R410 Disorientation, unspecified: Secondary | ICD-10-CM | POA: Insufficient documentation

## 2014-08-28 DIAGNOSIS — N289 Disorder of kidney and ureter, unspecified: Secondary | ICD-10-CM | POA: Diagnosis present

## 2014-08-28 LAB — URINALYSIS COMPLETE WITH MICROSCOPIC (ARMC ONLY)
Bilirubin Urine: NEGATIVE
Glucose, UA: 150 mg/dL — AB
HGB URINE DIPSTICK: NEGATIVE
KETONES UR: NEGATIVE mg/dL
Nitrite: NEGATIVE
PROTEIN: 30 mg/dL — AB
Specific Gravity, Urine: 1.015 (ref 1.005–1.030)
pH: 5 (ref 5.0–8.0)

## 2014-08-28 LAB — COMPREHENSIVE METABOLIC PANEL
ALBUMIN: 3.8 g/dL (ref 3.5–5.0)
ALK PHOS: 59 U/L (ref 38–126)
ALT: 20 U/L (ref 14–54)
AST: 30 U/L (ref 15–41)
Anion gap: 10 (ref 5–15)
BUN: 14 mg/dL (ref 6–20)
CALCIUM: 9.6 mg/dL (ref 8.9–10.3)
CHLORIDE: 100 mmol/L — AB (ref 101–111)
CO2: 27 mmol/L (ref 22–32)
Creatinine, Ser: 1.48 mg/dL — ABNORMAL HIGH (ref 0.44–1.00)
GFR, EST AFRICAN AMERICAN: 35 mL/min — AB (ref >60)
GFR, EST NON AFRICAN AMERICAN: 31 mL/min — AB (ref >60)
Glucose, Bld: 245 mg/dL — ABNORMAL HIGH (ref 65–99)
POTASSIUM: 3.7 mmol/L (ref 3.5–5.1)
Sodium: 137 mmol/L (ref 135–145)
TOTAL PROTEIN: 6.9 g/dL (ref 6.5–8.1)
Total Bilirubin: 0.8 mg/dL (ref 0.3–1.2)

## 2014-08-28 LAB — GLUCOSE, CAPILLARY
GLUCOSE-CAPILLARY: 94 mg/dL (ref 65–99)
GLUCOSE-CAPILLARY: 111 mg/dL — AB (ref 65–99)
Glucose-Capillary: 83 mg/dL (ref 65–99)
Glucose-Capillary: 112 mg/dL — ABNORMAL HIGH (ref 65–99)

## 2014-08-28 LAB — CBC
HEMATOCRIT: 37.5 % (ref 35.0–47.0)
Hemoglobin: 12.3 g/dL (ref 12.0–16.0)
MCH: 30.3 pg (ref 26.0–34.0)
MCHC: 32.9 g/dL (ref 32.0–36.0)
MCV: 92.1 fL (ref 80.0–100.0)
Platelets: 217 10*3/uL (ref 150–440)
RBC: 4.07 MIL/uL (ref 3.80–5.20)
RDW: 14.8 % — AB (ref 11.5–14.5)
WBC: 6.6 10*3/uL (ref 3.6–11.0)

## 2014-08-28 LAB — TROPONIN I

## 2014-08-28 LAB — C DIFFICILE QUICK SCREEN W PCR REFLEX
C DIFFICILE (CDIFF) TOXIN: NEGATIVE
C Diff antigen: NEGATIVE
C Diff interpretation: NEGATIVE

## 2014-08-28 LAB — LACTIC ACID, PLASMA: Lactic Acid, Venous: 2.5 mmol/L (ref 0.5–2.0)

## 2014-08-28 MED ORDER — GABAPENTIN 300 MG PO CAPS
300.0000 mg | ORAL_CAPSULE | Freq: Two times a day (BID) | ORAL | Status: DC
  Administered 2014-08-28 – 2014-08-29 (×3): 300 mg via ORAL
  Filled 2014-08-28 (×3): qty 1

## 2014-08-28 MED ORDER — ACETAMINOPHEN 325 MG PO TABS: 650.0000 mg | ORAL_TABLET | Freq: Four times a day (QID) | ORAL | Status: DC | PRN

## 2014-08-28 MED ORDER — SENNOSIDES-DOCUSATE SODIUM 8.6-50 MG PO TABS: 1.0000 | ORAL_TABLET | Freq: Every evening | ORAL | Status: DC | PRN

## 2014-08-28 MED ORDER — CYCLOBENZAPRINE HCL 10 MG PO TABS: 10.0000 mg | ORAL_TABLET | Freq: Three times a day (TID) | ORAL | Status: DC | PRN

## 2014-08-28 MED ORDER — INSULIN ASPART 100 UNIT/ML ~~LOC~~ SOLN
0.0000 [IU] | Freq: Three times a day (TID) | SUBCUTANEOUS | Status: DC
Start: 2014-08-28 — End: 2014-08-29

## 2014-08-28 MED ORDER — IOHEXOL 300 MG/ML  SOLN
80.0000 mL | Freq: Once | INTRAMUSCULAR | Status: AC | PRN
  Administered 2014-08-28: 80 mL via INTRAVENOUS

## 2014-08-28 MED ORDER — GLIMEPIRIDE 2 MG PO TABS: 1.0000 mg | ORAL_TABLET | Freq: Every day | ORAL | Status: DC

## 2014-08-28 MED ORDER — PANTOPRAZOLE SODIUM 40 MG PO TBEC
40.0000 mg | DELAYED_RELEASE_TABLET | Freq: Two times a day (BID) | ORAL | Status: DC
  Administered 2014-08-28 – 2014-08-29 (×3): 40 mg via ORAL
  Filled 2014-08-28 (×3): qty 1

## 2014-08-28 MED ORDER — DORZOLAMIDE HCL-TIMOLOL MAL 2-0.5 % OP SOLN
1.0000 [drp] | Freq: Two times a day (BID) | OPHTHALMIC | Status: DC
  Administered 2014-08-28 – 2014-08-29 (×3): 1 [drp] via OPHTHALMIC
  Filled 2014-08-28: qty 10

## 2014-08-28 MED ORDER — HEPARIN SODIUM (PORCINE) 5000 UNIT/ML IJ SOLN
5000.0000 [IU] | Freq: Three times a day (TID) | INTRAMUSCULAR | Status: DC
  Administered 2014-08-28 – 2014-08-29 (×3): 5000 [IU] via SUBCUTANEOUS
  Filled 2014-08-28 (×3): qty 1

## 2014-08-28 MED ORDER — ASPIRIN EC 81 MG PO TBEC
81.0000 mg | DELAYED_RELEASE_TABLET | Freq: Every day | ORAL | Status: DC
  Administered 2014-08-28 – 2014-08-29 (×2): 81 mg via ORAL
  Filled 2014-08-28 (×2): qty 1

## 2014-08-28 MED ORDER — ONDANSETRON HCL 4 MG PO TABS: 4.0000 mg | ORAL_TABLET | Freq: Four times a day (QID) | ORAL | Status: DC | PRN

## 2014-08-28 MED ORDER — SODIUM CHLORIDE 0.9 % IV BOLUS (SEPSIS)
1000.0000 mL | Freq: Once | INTRAVENOUS | Status: AC
  Administered 2014-08-28: 1000 mL via INTRAVENOUS

## 2014-08-28 MED ORDER — PILOCARPINE HCL 4 % OP SOLN
1.0000 [drp] | Freq: Four times a day (QID) | OPHTHALMIC | Status: DC
  Administered 2014-08-28 – 2014-08-29 (×5): 1 [drp] via OPHTHALMIC
  Filled 2014-08-28: qty 15

## 2014-08-28 MED ORDER — IOHEXOL 240 MG/ML SOLN
25.0000 mL | Freq: Once | INTRAMUSCULAR | Status: AC | PRN
  Administered 2014-08-28: 25 mL via ORAL

## 2014-08-28 MED ORDER — COLCHICINE 0.6 MG PO TABS
0.6000 mg | ORAL_TABLET | Freq: Every day | ORAL | Status: DC
  Administered 2014-08-28: 0.6 mg via ORAL
  Filled 2014-08-28 (×3): qty 1

## 2014-08-28 MED ORDER — VITAMIN D 1000 UNITS PO TABS
2000.0000 [IU] | ORAL_TABLET | Freq: Every day | ORAL | Status: DC
  Administered 2014-08-28 – 2014-08-29 (×2): 2000 [IU] via ORAL
  Filled 2014-08-28 (×2): qty 2

## 2014-08-28 MED ORDER — POLYSACCHARIDE IRON COMPLEX 150 MG PO CAPS
150.0000 mg | ORAL_CAPSULE | Freq: Two times a day (BID) | ORAL | Status: DC
  Administered 2014-08-28 (×2): 150 mg via ORAL
  Filled 2014-08-28 (×5): qty 1

## 2014-08-28 MED ORDER — CIPROFLOXACIN HCL 250 MG PO TABS
250.0000 mg | ORAL_TABLET | Freq: Two times a day (BID) | ORAL | Status: DC
  Administered 2014-08-28 – 2014-08-29 (×2): 250 mg via ORAL
  Filled 2014-08-28 (×2): qty 1

## 2014-08-28 MED ORDER — INSULIN ASPART 100 UNIT/ML ~~LOC~~ SOLN: 0.0000 [IU] | Freq: Every day | SUBCUTANEOUS | Status: DC

## 2014-08-28 MED ORDER — BRIMONIDINE TARTRATE 0.15 % OP SOLN
1.0000 [drp] | Freq: Two times a day (BID) | OPHTHALMIC | Status: DC
  Administered 2014-08-28 – 2014-08-29 (×3): 1 [drp] via OPHTHALMIC
  Filled 2014-08-28: qty 5

## 2014-08-28 MED ORDER — SODIUM CHLORIDE 0.9 % IV SOLN
INTRAVENOUS | Status: DC
  Administered 2014-08-28: 1000 mL via INTRAVENOUS
  Administered 2014-08-28: 23:00:00 via INTRAVENOUS

## 2014-08-28 MED ORDER — BRIMONIDINE TARTRATE 0.15 % OP SOLN
1.0000 [drp] | Freq: Two times a day (BID) | OPHTHALMIC | Status: DC
  Filled 2014-08-28: qty 5

## 2014-08-28 MED ORDER — METOPROLOL SUCCINATE ER 50 MG PO TB24
50.0000 mg | ORAL_TABLET | Freq: Every day | ORAL | Status: DC
  Administered 2014-08-28 – 2014-08-29 (×2): 50 mg via ORAL
  Filled 2014-08-28 (×3): qty 1

## 2014-08-28 MED ORDER — CIPROFLOXACIN HCL 500 MG PO TABS: 500.0000 mg | ORAL_TABLET | Freq: Two times a day (BID) | ORAL | Status: DC

## 2014-08-28 MED ORDER — ACETAMINOPHEN 650 MG RE SUPP: 650.0000 mg | Freq: Four times a day (QID) | RECTAL | Status: DC | PRN

## 2014-08-28 MED ORDER — ONDANSETRON HCL 4 MG/2ML IJ SOLN: 4.0000 mg | Freq: Four times a day (QID) | INTRAMUSCULAR | Status: DC | PRN

## 2014-08-28 NOTE — ED Provider Notes (Signed)
[facility] Emergency Department Provider Note  ____________________________________________  Time seen: Approximately 12:20 AM  I have reviewed the triage vital signs and the nursing notes.   History is limited as patient is altered and family does not know the full history.  HISTORY  Chief Complaint Diarrhea and Altered Mental Status    HPI [name] is a 81 y.o. female who was brought in by her family as they feel she is dehydrated. The family reports that the patient has had diarrhea for a while and has not been eating solid foods but they report that she has been drinking some. The patient lives at home alone and per the family is very private so they do not know a lot of specifics about what has been going on with her home. They report that tonight she seemed to be delusional and hearing noises of something frying and seeing smoke. The family reports that the patient is had diarrhea on and off for months but the family noticed that it was very bad this weekend. The patient was complaining of abdominal pain. They were able to convince her to drink Pedialyte and boost but they're unsure what she has been eating or drinking. The family reports that she's had the diarrhea with dehydration before but she has not been confused or altered. They report that she is tired and has been complaining of being sleepy. A shunt has had some nausea as well as abdominal pain but they do not know of any other complaints that she has had. I am unable to obtain a pain scale is the patient is somnolent and minimally answering questions.   Past Medical History  Diagnosis Date  . Hypertension   . Hyperlipidemia   . Diabetes mellitus without complication   . CHF (congestive heart failure)   . GERD (gastroesophageal reflux disease)   . Glaucoma   . Heart murmur   . Depression   . Gout   . Hiatal hernia   . Bone disease   . Anemia   . Aortic stenosis   . Cervical spine  arthritis   . Vitamin deficiency   . Cognitive impairment   . Renal insufficiency     Patient Active Problem List   Diagnosis Date Noted  . SVT (supraventricular tachycardia) 12/17/2012  . Diabetes mellitus, type 2 12/17/2012  . SOB (shortness of breath) 12/17/2012  . Essential hypertension, malignant 12/17/2012  . Chronic diastolic CHF (congestive heart failure) 12/17/2012    Past Surgical History  Procedure Laterality Date  . Tubal ligation      Current Outpatient Rx  Name  Route  Sig  Dispense  Refill  . aspirin 81 MG tablet   Oral   Take 81 mg by mouth daily.         . brimonidine (ALPHAGAN P) 0.1 % SOLN   Right Eye   Place 1 drop into the right eye 2 (two) times daily.         . brimonidine (ALPHAGAN) 0.15 % ophthalmic solution   Right Eye   Place 1 drop into the right eye 2 (two) times daily.         . cholecalciferol (VITAMIN D) 1000 UNITS tablet   Oral   Take 2,000 Units by mouth daily.          . clotrimazole (LOTRIMIN) 1 % cream   Topical   Apply 1 application topically as needed.         . colchicine  0.6 MG tablet   Oral   Take 0.6 mg by mouth daily.         . cyclobenzaprine (FLEXERIL) 10 MG tablet   Oral   Take 10 mg by mouth 3 (three) times daily as needed for muscle spasms.         . Dorzolamide HCl-Timolol Mal (COSOPT OP)   Ophthalmic   Apply to eye 2 (two) times daily.         Marland Kitchen gabapentin (NEURONTIN) 300 MG capsule   Oral   Take 300 mg by mouth 2 (two) times daily.         Marland Kitchen glimepiride (AMARYL) 1 MG tablet   Oral   Take 1 mg by mouth daily with breakfast.         . glycopyrrolate (ROBINUL) 1 MG tablet   Oral   Take by mouth. Takes 1/4 tab;et three times daily.         Marland Kitchen HYDROcodone-acetaminophen (NORCO/VICODIN) 5-325 MG per tablet   Oral   Take 1 tablet by mouth every 6 (six) hours as needed for pain.         . iron polysaccharides (NIFEREX) 150 MG capsule   Oral   Take 150 mg by mouth 2 (two) times  daily.         Marland Kitchen lisinopril (PRINIVIL,ZESTRIL) 40 MG tablet      TAKE 1 TABLET BY MOUTH ONCE DAILY (8AM)   90 tablet   3   . lovastatin (MEVACOR) 40 MG tablet   Oral   Take 40 mg by mouth at bedtime.         . Magnesium 250 MG TABS   Oral   Take 2 tablets by mouth daily.         . metoprolol succinate (TOPROL-XL) 50 MG 24 hr tablet   Oral   Take 50 mg by mouth daily. Take with or immediately following a meal.         . pantoprazole (PROTONIX) 40 MG tablet   Oral   Take 40 mg by mouth 2 (two) times daily.         . pilocarpine (PILOCAR) 4 % ophthalmic solution   Right Eye   Place 1 drop into the right eye 4 (four) times daily.            Allergies Metformin and related  Family History  Problem Relation Age of Onset  . Glaucoma Mother   . Peptic Ulcer Father   . Colitis Father     Social History History  Substance Use Topics  . Smoking status: Never Smoker   . Smokeless tobacco: Not on file  . Alcohol Use: No    Review of Systems Constitutional: No fever/chills Eyes: No visual changes. ENT: No sore throat. Cardiovascular: Denies chest pain. Respiratory: Denies shortness of breath. Gastrointestinal: Abdominal pain, nausea and diarrhea Genitourinary: Negative for dysuria. Musculoskeletal: Negative for back pain. Skin: Negative for rash. Neurological: Negative for headaches,   10-point ROS otherwise negative.  ____________________________________________   PHYSICAL EXAM:  VITAL SIGNS: ED Triage Vitals  Enc Vitals Group     BP 08/27/14 2323 78/51 mmHg     Pulse Rate 08/27/14 2323 58     Resp 08/27/14 2323 20     Temp 08/28/14 0013 97.4 F (36.3 C)     Temp Source 08/27/14 2323 Oral     SpO2 08/27/14 2323 98 %     Weight 08/27/14 2323 140 lb (63.504 kg)  Height 08/27/14 2323 5\' 1"  (1.549 m)     Head Cir --      Peak Flow --      Pain Score --      Pain Loc --      Pain Edu? --      Excl. in [place]? --     Constitutional:  Somnolent and minimally responsive. Moderate distress Eyes: Conjunctivae are normal. PERRL. EOMI. Head: Atraumatic. Nose: No congestion/rhinnorhea. Mouth/Throat: Mucous membranes are dry.  Oropharynx non-erythematous. Cardiovascular: Normal rate, regular rhythm. Grossly normal heart sounds.  Good peripheral circulation. Respiratory: Normal respiratory effort.  No retractions. Lungs CTAB. Gastrointestinal: Soft and nontender. No distention. No abdominal bruits. No CVA tenderness. Genitourinary: deferred Musculoskeletal: No lower extremity tenderness nor edema.  No joint effusions. Neurologic: Mumbling but not speaking normal language patient is able to follow commands by opening her eyes and squeezing with normal strength but is very somnolent and cannot stay awake for the exam Skin:  Skin is warm, dry and intact. No rash noted. Psychiatric: somnolent and minimally responsive ____________________________________________   LABS (all labs ordered are listed, but only abnormal results are displayed)  Labs Reviewed  CBC - Abnormal; Notable for the following:    RDW 14.8 (*)    All other components within normal limits  COMPREHENSIVE METABOLIC PANEL - Abnormal; Notable for the following:    Chloride 100 (*)    Glucose, Bld 245 (*)    Creatinine, Ser 1.48 (*)    GFR calc non Af Amer 31 (*)    GFR calc Af Amer 35 (*)    All other components within normal limits  URINALYSIS COMPLETEWITH MICROSCOPIC ([facility] ONLY) - Abnormal; Notable for the following:    Color, Urine YELLOW (*)    APPearance CLOUDY (*)    Glucose, UA 150 (*)    Protein, ur 30 (*)    Leukocytes, UA TRACE (*)    Bacteria, UA FEW (*)    Squamous Epithelial / LPF 0-5 (*)    All other components within normal limits  LACTIC ACID, PLASMA - Abnormal; Notable for the following:    Lactic Acid, Venous 2.5 (*)    All other components within normal limits  CULTURE, BLOOD (ROUTINE X 2)  CULTURE, BLOOD (ROUTINE X 2)  TROPONIN I    ____________________________________________  EKG  ____________________________________________  RADIOLOGY  CT head: No acute intracranial process, new right mesial thalamus lacunar infarct, mild white matter changes and chronic small vessel ischemic disease Chest x-ray: Aortic atherosclerotic change CT abdomen and pelvis: Severe prolapse of the bladder and rectum well below the pubic symphysis, bilateral hydroureter and hydronephrosis due to mechanical compromise of the review of vesicular junctions within the prolapse, diverticulosis, hiatal hernia ____________________________________________   PROCEDURES  Procedure(s) performed: None  Critical Care performed: Yes, see critical care note(s)   CRITICAL CARE Performed by: Charlesetta Ivory P   Total critical care time: 30  Critical care time was exclusive of separately billable procedures and treating other patients.  Critical care was necessary to treat or prevent imminent or life-threatening deterioration.  Critical care was time spent personally by me on the following activities: development of treatment plan with patient and/or surrogate as well as nursing, discussions with consultants, evaluation of patient's response to treatment, examination of patient, obtaining history from patient or surrogate, ordering and performing treatments and interventions, ordering and review of laboratory studies, ordering and review of radiographic studies, pulse oximetry and re-evaluation of patient's condition.  ____________________________________________  INITIAL IMPRESSION / ASSESSMENT AND PLAN / ED COURSE  Pertinent labs & imaging results that were available during my care of the patient were reviewed by me and considered in my medical decision making (see chart for details).  This is an 81 year old female who comes in with diarrhea for multiple months and altered mental status with a concern for dehydration. The patient had an  episode of low blood pressure when she initially arrived to the hospital of 78/51 and had a repeat episode of low blood pressure at midnight. I will give the patient liter of normal saline I will do a CT scan of her head as she is very somnolent and I will check some blood work to determine what else may be causing the patient's symptoms.  ----------------------------------------- 5:13 AM on 08/28/2014 -----------------------------------------  The patient's blood pressure improved after 2 L of normal saline. It was found that her lactic acid was 2.5. The patient's CT scan though shows some bilateral hydroureter and hydronephrosis due to the patient's prolapse. At this time we still have been unable to obtain a urinalysis due to the patient's prolapse. Given the patient's hypotension I will admit the patient to the hospitalist service for further treatment and evaluation of her symptoms. The patient is still very sleepy but is arousable little more than previous. I explained this to the family and they agree with the plan. ____________________________________________   FINAL CLINICAL IMPRESSION(S) / ED DIAGNOSES  Final diagnoses:  Other specified hypotension  Dehydration  Diarrhea      Loney Hering, MD 08/28/14 [phone]

## 2014-08-28 NOTE — Progress Notes (Signed)
   08/28/14 1225  Clinical Encounter Type  Visited With Patient and family together  Visit Type Initial  Referral From Family  Consult/Referral To Chaplain  Spiritual Encounters  Spiritual Needs Emotional;Prayer  Provided pastoral support and prayer to patient and her daughter.  Patient wanted a prayer for healing and for the problems in the world.  Patient was calm and friendly.  I let patient know that she could contact me if needed or that anyone in pastoral care could assist her at any time.  Patient and family thanked me for my visit.  [place] [phone]

## 2014-08-28 NOTE — H&P (Signed)
[place] [facility] [place] NAME: [name]    MR#:  [id number]  DATE OF BIRTH:  [birth year]  DATE OF ADMISSION:  08/27/2014  PRIMARY CARE PHYSICIAN: [name], MD   REQUESTING/REFERRING PHYSICIAN: Dr. Dahlia Client  CHIEF COMPLAINT:  Diarrhea and consfusion with hypotension  HISTORY OF PRESENT ILLNESS:  [name]  is a 81 y.o. female with a known history of hypertension and diabetes who presents with above complaint. Family at bedside reports that over the past few days patient had diarrhea and abdominal pain. Patient denies any melanoma, hematochezia or hematemesis. Patient denies any fevers. Patient wanted to come to the ER because she says she has been weak and not feeling well. In the emergency department she was noted have a low blood pressure which has now responded to IV fluids. CT the abdomen showed bladder prolapse and bilateral hydronephrosis. The patient are the nose about this. Hospital service was contacted for hypotension and confusion. Patient's family reports that yesterday patient seemed very confused. Patient appears to be at her baseline at this time. CT the head shows no acute stroke.  PAST MEDICAL HISTORY:   Past Medical History  Diagnosis Date  . Hypertension   . Hyperlipidemia   . Diabetes mellitus without complication   . CHF (congestive heart failure)   . GERD (gastroesophageal reflux disease)   . Glaucoma   . Heart murmur   . Depression   . Gout   . Hiatal hernia   . Bone disease   . Anemia   . Aortic stenosis   . Cervical spine arthritis   . Vitamin deficiency   . Cognitive impairment   . Renal insufficiency     PAST SURGICAL HISTORY:   Past Surgical History  Procedure Laterality Date  . Tubal ligation      SOCIAL HISTORY:   History  Substance Use Topics  . Smoking status: Never Smoker   . Smokeless tobacco: Not on file  . Alcohol Use: No    FAMILY HISTORY:   Family History   Problem Relation Age of Onset  . Glaucoma Mother   . Peptic Ulcer Father   . Colitis Father     DRUG ALLERGIES:   Allergies  Allergen Reactions  . Metformin And Related      REVIEW OF SYSTEMS:  CONSTITUTIONAL: No fever, positive fatigue and weakness.  EYES: No blurred or double vision. Positive history of glaucoma EARS, NOSE, AND THROAT: No tinnitus or ear pain.  RESPIRATORY: No cough, shortness of breath, wheezing or hemoptysis.  CARDIOVASCULAR: No chest pain, orthopnea, edema.  GASTROINTESTINAL: No nausea, vomiting, positive diarrhea and abdominal pain which is now subsided GENITOURINARY: No dysuria, hematuria.  ENDOCRINE: No polyuria, nocturia,  HEMATOLOGY: No anemia, easy bruising or bleeding SKIN: No rash or lesion. MUSCULOSKELETAL: No joint pain or arthritis.   NEUROLOGIC: No tingling, numbness, weakness.  PSYCHIATRY: No anxiety or depression.   MEDICATIONS AT HOME:   Prior to Admission medications   Medication Sig Start Date End Date Taking? Authorizing Provider  aspirin 81 MG tablet Take 81 mg by mouth daily.    Historical Provider, MD  brimonidine (ALPHAGAN P) 0.1 % SOLN Place 1 drop into the right eye 2 (two) times daily.    Historical Provider, MD  brimonidine (ALPHAGAN) 0.15 % ophthalmic solution Place 1 drop into the right eye 2 (two) times daily.    Historical Provider, MD  cholecalciferol (VITAMIN D) 1000 UNITS tablet Take 2,000 Units by  mouth daily.     Historical Provider, MD  clotrimazole (LOTRIMIN) 1 % cream Apply 1 application topically as needed.    Historical Provider, MD  colchicine 0.6 MG tablet Take 0.6 mg by mouth daily.    Historical Provider, MD  cyclobenzaprine (FLEXERIL) 10 MG tablet Take 10 mg by mouth 3 (three) times daily as needed for muscle spasms.    Historical Provider, MD  Dorzolamide HCl-Timolol Mal (COSOPT OP) Apply to eye 2 (two) times daily.    Historical Provider, MD  gabapentin (NEURONTIN) 300 MG capsule Take 300 mg by mouth 2  (two) times daily.    Historical Provider, MD  glimepiride (AMARYL) 1 MG tablet Take 1 mg by mouth daily with breakfast.    Historical Provider, MD  glycopyrrolate (ROBINUL) 1 MG tablet Take by mouth. Takes 1/4 tab;et three times daily.    Historical Provider, MD  HYDROcodone-acetaminophen (NORCO/VICODIN) 5-325 MG per tablet Take 1 tablet by mouth every 6 (six) hours as needed for pain.    Historical Provider, MD  iron polysaccharides (NIFEREX) 150 MG capsule Take 150 mg by mouth 2 (two) times daily.    Historical Provider, MD  lisinopril (PRINIVIL,ZESTRIL) 40 MG tablet TAKE 1 TABLET BY MOUTH ONCE DAILY (8AM) 12/25/13   Minna Merritts, MD  lovastatin (MEVACOR) 40 MG tablet Take 40 mg by mouth at bedtime.    Historical Provider, MD  Magnesium 250 MG TABS Take 2 tablets by mouth daily.    Historical Provider, MD  metoprolol succinate (TOPROL-XL) 50 MG 24 hr tablet Take 50 mg by mouth daily. Take with or immediately following a meal.    Historical Provider, MD  pantoprazole (PROTONIX) 40 MG tablet Take 40 mg by mouth 2 (two) times daily.    Historical Provider, MD  pilocarpine (PILOCAR) 4 % ophthalmic solution Place 1 drop into the right eye 4 (four) times daily.     Historical Provider, MD      VITAL SIGNS:  Blood pressure 145/73, pulse 64, temperature 97.4 F (36.3 C), temperature source Rectal, resp. rate 14, height 5\' 1"  (1.549 m), weight 63.504 kg (140 lb), SpO2 100 %.  PHYSICAL EXAMINATION:  GENERAL:  81 y.o.-year-old patient lying in the bed with no acute distress.  EYES: Pupils equal, round, reactive to light and accommodation. No scleral icterus. Extraocular muscles intact.  HEENT: Head atraumatic, normocephalic. Oropharynx and nasopharynx clear.  NECK:  Supple, no jugular venous distention. No thyroid enlargement, no tenderness.  LUNGS: Normal breath sounds bilaterally, no wheezing, rales,rhonchi or crepitation. No use of accessory muscles of respiration.  CARDIOVASCULAR: S1, S2  normal. 2/6 SEM NO rubs, or gallops.  ABDOMEN: Soft, nontender, nondistended. Bowel sounds present. No organomegaly or mass.  EXTREMITIES: No pedal edema, cyanosis, or clubbing.  NEUROLOGIC: Cranial nerves II through XII are grossly intact. No focal deficits. PSYCHIATRIC: The patient is alert and oriented x 3.  SKIN: No obvious rash, lesion, or ulcer.   LABORATORY PANEL:   CBC  Recent Labs Lab 08/27/14 2351  WBC 6.6  HGB 12.3  HCT 37.5  PLT 217   ------------------------------------------------------------------------------------------------------------------  Chemistries   Recent Labs Lab 08/27/14 2351  NA 137  K 3.7  CL 100*  CO2 27  GLUCOSE 245*  BUN 14  CREATININE 1.48*  CALCIUM 9.6  AST 30  ALT 20  ALKPHOS 59  BILITOT 0.8   ------------------------------------------------------------------------------------------------------------------  Cardiac Enzymes  Recent Labs Lab 08/27/14 2351  TROPONINI <0.03   ------------------------------------------------------------------------------------------------------------------  RADIOLOGY:  Ct Head Wo  Contrast  08/28/2014   CIMPRESSION: No acute intracranial process.  New RIGHT mesial thalamus lacunar infarct does not appear acute.  Involutional changes. Mild white matter changes compatible chronic small vessel ischemic disease.   Electronically Signed   By: Elon Alas M.D.   On: 08/28/2014 00:53   Ct Abdomen Pelvis W Contrast  08/28/2014   IMPRESSION: 1. Severe prolapse of the bladder and rectum well below the pubic symphysis. 2. Bilateral hydroureter and hydronephrosis due to mechanical compromise of the ureterovesical junctions within the prolapse 3. Diverticulosis 4. Hiatal hernia   Electronically Signed   By: Andreas Newport M.D.   On: 08/28/2014 04:28   Dg Chest Portable 1 View  08/28/2014   CLINICAL DATA:  Altered mental status  EXAM: PORTABLE CHEST - 1 VIEW  COMPARISON:  07/19/2014  FINDINGS: Cardiac  shadow is stable. The lungs are well aerated bilaterally. No acute bony abnormality is seen. Aortic calcifications are again noted.  IMPRESSION: Aortic atherosclerotic change.  No acute abnormality noted.   Electronically Signed   By: Inez Catalina M.D.   On: 08/28/2014 00:47    EKG:    IMPRESSION AND PLAN:  This is a very pleasant 81 year old female with a history of hypertension and diabetes who presents with confusion, hypotension and diarrhea.  1. Hypotension: This is secondary to dehydration. Her blood pressure has improved with IV fluids. We will continue IV fluids.  2. Diarrhea: Likely viral in etiology. Patient's diarrhea has subsided. I will order stool cultures and C. Difficile.  3. Acute kidney injury: Likely secondary dehydration. I will hold nephrotoxic agents including lisinopril. I have ordered IV fluids. We will repeat a BMP in a.m.  4. Hypertension, essential: Patient's blood pressures responded well to IV fluids. At this time she may resume her home medications including metoprolol. Due to her acute kidney injury I am holding lisinopril. Blood pressure will be monitored.  5. Diabetes: Patient will continue on an ADA diet. I will continue her outpatient medications. She will also be placed on sliding scale insulin.  6. Bladder prolapse with bilateral hydronephrosis: This appears chronic in nature. Patient would benefit from a GYN and urology out patient follow-up.  7. Glaucoma: We will continue outpatient medications.  8. Confusion: Patient has an old stroke on CT scan. Patient's confusion has improved. I suspect her confusion was secondary to hypotension and dehydration. She has no neurological deficits.  All the records are reviewed and case discussed with ED provider. Management plans discussed with the patient and family and they are in agreement.  CODE STATUS: FULL  TOTAL TIME TAKING CARE OF THIS PATIENT: 45 minutes.    Allexus Ovens M.D on 08/28/2014 at 7:28  AM  Between 7am to 6pm - Pager - [phone] After 6pm go to [email or web address] - password [email or web address] [facility]  Office  [phone]  CC: Primary care physician; [name], MD

## 2014-08-28 NOTE — Progress Notes (Signed)
ANTIBIOTIC CONSULT NOTE - INITIAL  Pharmacy Consult for Cipro dosing  Indication: UTI  Allergies  Allergen Reactions  . Metformin And Related     Patient Measurements: Height: 5\' 1"  (154.9 cm) Weight: 140 lb (63.504 kg) IBW/kg (Calculated) : 47.8   Vital Signs: Temp: 97.4 F (36.3 C) (06/30 0013) Temp Source: Rectal (06/30 0013) BP: 145/73 mmHg (06/30 0700) Pulse Rate: 64 (06/30 0700) Intake/Output from previous day:   Intake/Output from this shift:    Labs:  Recent Labs  08/27/14 2351  WBC 6.6  HGB 12.3  PLT 217  CREATININE 1.48*   Estimated Creatinine Clearance: 22.9 mL/min (by C-G formula based on Cr of 1.48). No results for input(s): VANCOTROUGH, VANCOPEAK, VANCORANDOM, GENTTROUGH, GENTPEAK, GENTRANDOM, TOBRATROUGH, TOBRAPEAK, TOBRARND, AMIKACINPEAK, AMIKACINTROU, AMIKACIN in the last 72 hours.   Microbiology: No results found for this or any previous visit (from the past 720 hour(s)).  Medical History: Past Medical History  Diagnosis Date  . Hypertension   . Hyperlipidemia   . Diabetes mellitus without complication   . CHF (congestive heart failure)   . GERD (gastroesophageal reflux disease)   . Glaucoma   . Heart murmur   . Depression   . Gout   . Hiatal hernia   . Bone disease   . Anemia   . Aortic stenosis   . Cervical spine arthritis   . Vitamin deficiency   . Cognitive impairment   . Renal insufficiency     Medications:  Scheduled:  . ciprofloxacin  250 mg Oral BID   Infusions:   Assessment: 81 yo female being treated for UTI. Patient also has clostridium difficile test pending.      Plan:  Will continue patient on ciprofloxacin 250mg  PO Q12hr.   Expected duration 7 days with resolution of temperature and/or normalization of WBC   Pharmacy will continue to monitor and adjust per consult.   Augustin Bun L 08/28/2014,8:21 AM

## 2014-08-29 LAB — GLUCOSE, CAPILLARY
GLUCOSE-CAPILLARY: 60 mg/dL — AB (ref 65–99)
Glucose-Capillary: 108 mg/dL — ABNORMAL HIGH (ref 65–99)
Glucose-Capillary: 99 mg/dL (ref 65–99)

## 2014-08-29 LAB — BASIC METABOLIC PANEL
Anion gap: 5 (ref 5–15)
BUN: 10 mg/dL (ref 6–20)
CHLORIDE: 111 mmol/L (ref 101–111)
CO2: 26 mmol/L (ref 22–32)
Calcium: 8.7 mg/dL — ABNORMAL LOW (ref 8.9–10.3)
Creatinine, Ser: 0.84 mg/dL (ref 0.44–1.00)
GFR calc Af Amer: >60 mL/min (ref >60)
GFR calc non Af Amer: >60 mL/min (ref >60)
GLUCOSE: 76 mg/dL (ref 65–99)
Potassium: 3.3 mmol/L — ABNORMAL LOW (ref 3.5–5.1)
SODIUM: 142 mmol/L (ref 135–145)

## 2014-08-29 LAB — CBC
HEMATOCRIT: 32.5 % — AB (ref 35.0–47.0)
Hemoglobin: 11.1 g/dL — ABNORMAL LOW (ref 12.0–16.0)
MCH: 31.1 pg (ref 26.0–34.0)
MCHC: 34.2 g/dL (ref 32.0–36.0)
MCV: 90.9 fL (ref 80.0–100.0)
PLATELETS: 201 10*3/uL (ref 150–440)
RBC: 3.57 MIL/uL — AB (ref 3.80–5.20)
RDW: 14.7 % — ABNORMAL HIGH (ref 11.5–14.5)
WBC: 7.2 10*3/uL (ref 3.6–11.0)

## 2014-08-29 MED ORDER — CIPROFLOXACIN HCL 250 MG PO TABS: 250.0000 mg | ORAL_TABLET | Freq: Two times a day (BID) | ORAL | Status: DC

## 2014-08-29 NOTE — Discharge Instructions (Signed)

## 2014-08-29 NOTE — Progress Notes (Signed)
Inpatient Diabetes Program Recommendations  AACE/ADA: New Consensus Statement on Inpatient Glycemic Control (2013)  Target Ranges:  Prepandial:   less than 140 mg/dL      Peak postprandial:   less than 180 mg/dL (1-2 hours)      Critically ill patients:  140 - 180 mg/dL   Reason for assessment: low blood sugar  Diabetes history: Type 2 Outpatient Diabetes medications: Amaryl 1mg  qday Current orders for Inpatient glycemic control: Amaryl 1 mg q day, novolog 0-9 units tid and 0-5 units qhs  Consider stopping Amaryl while patient is in the hospital; low blood sugar this am (60mg /dl) - continue Novolog as ordered.  Gentry Fitz, RN, BA, MHA, CDE Diabetes Coordinator Inpatient Diabetes Program  [phone] (Team Pager) [phone] ([place]) 08/29/2014 11:49 AM

## 2014-09-01 LAB — STOOL CULTURE

## 2014-09-01 NOTE — Discharge Summary (Signed)
[facility] NAME: [name]    MR#:  [id number]  [place]:  [birth year]  DATE OF ADMISSION:  08/27/2014 ADMITTING PHYSICIAN: Bettey Costa, MD  DATE OF DISCHARGE: 08/29/2014 12:00 PM  PRIMARY CARE PHYSICIAN: [name], MD    ADMISSION DIAGNOSIS:  Other specified hypotension [I95.89] Diarrhea [R19.7] Dehydration [E86.0]  DISCHARGE DIAGNOSIS:  Active Problems:   Renal failure   SECONDARY DIAGNOSIS:   Past Medical History  Diagnosis Date  . Hypertension   . Hyperlipidemia   . Diabetes mellitus without complication   . CHF (congestive heart failure)   . GERD (gastroesophageal reflux disease)   . Glaucoma   . Heart murmur   . Depression   . Gout   . Hiatal hernia   . Bone disease   . Anemia   . Aortic stenosis   . Cervical spine arthritis   . Vitamin deficiency   . Cognitive impairment   . Renal insufficiency      ADMITTING HISTORY  [name] is a 81 y.o. female with a known history of hypertension and diabetes who presents with above complaint. Family at bedside reports that over the past few days patient had diarrhea and abdominal pain. Patient denies any melanoma, hematochezia or hematemesis. Patient denies any fevers. Patient wanted to come to the ER because she says she has been weak and not feeling well. In the emergency department she was noted have a low blood pressure which has now responded to IV fluids. CT the abdomen showed bladder prolapse and bilateral hydronephrosis. The patient are the nose about this. Hospital service was contacted for hypotension and confusion. Patient's family reports that yesterday patient seemed very confused. Patient appears to be at her baseline at this time. CT the head shows no acute stroke.   HOSPITAL COURSE:   This is a very pleasant 81 year old female with a history of hypertension and diabetes who presents with confusion, hypotension and  diarrhea.  1. Hypotension: This is secondary to dehydration. Her blood pressure has improved with IV fluids. We will continue IV fluids.  2. Diarrhea: Likely viral in etiology. Patient's diarrhea has subsided. I will order stool cultures and C. Difficile. resolved  3. Acute kidney injury: Likely secondary dehydration. I will hold nephrotoxic agents including lisinopril. I have ordered IV fluids. We will repeat a BMP in a.m. resolved  4. Hypertension, essential  5. Diabetes: Patient will continue on an ADA diet. I will continue her outpatient medications. She will also be placed on sliding scale insulin.  6. Bladder prolapse with bilateral hydronephrosis: This appears chronic in nature.   7. Glaucoma: We will continue outpatient medications.  8. Confusion:  Early dementia  Returned to baseline. Abx for UTI Stable at time of discharge CONSULTS OBTAINED:     DRUG ALLERGIES:   Allergies  Allergen Reactions  . Metformin And Related     DISCHARGE MEDICATIONS:   Discharge Medication List as of 08/29/2014 10:57 AM    START taking these medications   Details  ciprofloxacin (CIPRO) 250 MG tablet Take 1 tablet (250 mg total) by mouth 2 (two) times daily., Starting 08/29/2014, Until Discontinued, Normal      CONTINUE these medications which have NOT CHANGED   Details  aspirin 81 MG tablet Take 81 mg by mouth daily., Until Discontinued, Historical Med    brimonidine (ALPHAGAN P) 0.1 % SOLN Place 1 drop into the right eye 2 (two) times daily., Until Discontinued,  Historical Med    brimonidine (ALPHAGAN) 0.15 % ophthalmic solution Place 1 drop into the right eye 2 (two) times daily., Until Discontinued, Historical Med    cholecalciferol (VITAMIN D) 1000 UNITS tablet Take 2,000 Units by mouth daily. , Until Discontinued, Historical Med    colchicine 0.6 MG tablet Take 0.6 mg by mouth 3 (three) times daily as needed. , Until Discontinued, Historical Med    Dorzolamide HCl-Timolol  Mal (COSOPT OP) Apply to eye 2 (two) times daily., Until Discontinued, Historical Med    gabapentin (NEURONTIN) 300 MG capsule Take 300 mg by mouth 2 (two) times daily., Until Discontinued, Historical Med    glimepiride (AMARYL) 1 MG tablet Take 1 mg by mouth daily with breakfast., Until Discontinued, Historical Med    iron polysaccharides (NIFEREX) 150 MG capsule Take 150 mg by mouth 2 (two) times daily., Until Discontinued, Historical Med    latanoprost (XALATAN) 0.005 % ophthalmic solution Place 1 drop into the right eye at bedtime., Starting 07/03/2014, Until Discontinued, Historical Med    lisinopril (PRINIVIL,ZESTRIL) 40 MG tablet TAKE 1 TABLET BY MOUTH ONCE DAILY (8AM), Normal    lovastatin (MEVACOR) 40 MG tablet Take 40 mg by mouth at bedtime., Until Discontinued, Historical Med    Magnesium 250 MG TABS Take 2 tablets by mouth daily., Until Discontinued, Historical Med    metoprolol succinate (TOPROL-XL) 50 MG 24 hr tablet Take 50 mg by mouth daily. Take with or immediately following a meal., Until Discontinued, Historical Med    pantoprazole (PROTONIX) 40 MG tablet Take 40 mg by mouth 2 (two) times daily., Until Discontinued, Historical Med    pilocarpine (PILOCAR) 4 % ophthalmic solution Place 1 drop into the right eye 4 (four) times daily. , Until Discontinued, Historical Med    tiZANidine (ZANAFLEX) 4 MG tablet Take 1 tablet by mouth 3 (three) times daily as needed., Starting 08/08/2014, Until Discontinued, Historical Med      STOP taking these medications     cyclobenzaprine (FLEXERIL) 10 MG tablet          Today    VITAL SIGNS:  Blood pressure 148/71, pulse 74, temperature 97.8 F (36.6 C), temperature source Oral, resp. rate 18, height 5\' 1"  (1.549 m), weight 63.504 kg (140 lb), SpO2 97 %.  I/O:  No intake or output data in the 24 hours ending 09/01/14 1140  PHYSICAL EXAMINATION:  Physical Exam  GENERAL:  81 y.o.-year-old patient lying in the bed with no  acute distress.  LUNGS: Normal breath sounds bilaterally, no wheezing, rales,rhonchi or crepitation. No use of accessory muscles of respiration.  CARDIOVASCULAR: S1, S2 normal. No murmurs, rubs, or gallops.  ABDOMEN: Soft, non-tender, non-distended. Bowel sounds present. No organomegaly or mass.  NEUROLOGIC: Moves all 4 extremities. PSYCHIATRIC: The patient is alert and oriented x 3.  SKIN: No obvious rash, lesion, or ulcer.   DATA REVIEW:   CBC  Recent Labs Lab 08/29/14 0517  WBC 7.2  HGB 11.1*  HCT 32.5*  PLT 201    Chemistries   Recent Labs Lab 08/27/14 2351 08/29/14 0517  NA 137 142  K 3.7 3.3*  CL 100* 111  CO2 27 26  GLUCOSE 245* 76  BUN 14 10  CREATININE 1.48* 0.84  CALCIUM 9.6 8.7*  AST 30  --   ALT 20  --   ALKPHOS 59  --   BILITOT 0.8  --     Cardiac Enzymes  Recent Labs Lab 08/27/14 2351  TROPONINI <0.03  Microbiology Results  Results for orders placed or performed during the hospital encounter of 08/27/14  Culture, blood (routine x 2)     Status: None (Preliminary result)   Collection Time: 08/27/14 11:52 PM  Result Value Ref Range Status   Specimen Description BLOOD  Final   Special Requests Normal  Final   Culture NO GROWTH 4 DAYS  Final   Report Status PENDING  Incomplete  Culture, blood (routine x 2)     Status: None (Preliminary result)   Collection Time: 08/28/14 12:33 AM  Result Value Ref Range Status   Specimen Description BLOOD  Final   Special Requests Normal  Final   Culture NO GROWTH 4 DAYS  Final   Report Status PENDING  Incomplete  Stool culture     Status: None   Collection Time: 08/28/14 10:12 PM  Result Value Ref Range Status   Specimen Description STOOL  Final   Special Requests NONE  Final   Culture   Final    NO SALMONELLA OR SHIGELLA ISOLATED No Pathogenic E. coli detected NO CAMPYLOBACTER DETECTED    Report Status 09/01/2014 FINAL  Final  C difficile quick scan w PCR reflex ([facility] only)     Status: None    Collection Time: 08/28/14 10:12 PM  Result Value Ref Range Status   C Diff antigen NEGATIVE  Final   C Diff toxin NEGATIVE  Final   C Diff interpretation Negative for C. difficile  Final    RADIOLOGY:  No results found.    Follow up with PCP in 1 week.  Management plans discussed with the patient, family and they are in agreement.  CODE STATUS:  Advance Directive Documentation        Most Recent Value   Type of Advance Directive  Living will, Healthcare Power of Attorney   Pre-existing out of facility DNR order (yellow form or pink MOST form)     "MOST" Form in Place?        TOTAL TIME TAKING CARE OF THIS PATIENT ON DAY OF DISCHARGE: more than 30 minutes.    Hillary Bow R M.D on 09/01/2014 at 11:40 AM  Between 7am to 6pm - Pager - [phone]  After 6pm go to [email or web address] - password [email or web address] [facility]  Office  [phone]  CC: Primary care physician; [name], MD

## 2014-09-02 LAB — CULTURE, BLOOD (ROUTINE X 2)
CULTURE: NO GROWTH
CULTURE: NO GROWTH
SPECIAL REQUESTS: NORMAL
Special Requests: NORMAL

## 2014-09-10 ENCOUNTER — Encounter: Payer: Self-pay | Admitting: *Deleted

## 2014-09-10 ENCOUNTER — Inpatient Hospital Stay (HOSPITAL_COMMUNITY)
Admit: 2014-09-10 | Discharge: 2014-09-10 | Disposition: A | Discharge status: Home / Self Care | Payer: Medicare HMO | Attending: Internal Medicine | Admitting: Internal Medicine

## 2014-09-10 ENCOUNTER — Inpatient Hospital Stay
Admission: EM | Admit: 2014-09-10 | Discharge: 2014-09-12 | DRG: 309 | Disposition: A | Discharge status: Home / Self Care | Payer: Medicare HMO | Attending: Internal Medicine | Admitting: Internal Medicine

## 2014-09-10 DIAGNOSIS — I471 Supraventricular tachycardia, unspecified: Secondary | ICD-10-CM | POA: Diagnosis present

## 2014-09-10 DIAGNOSIS — D649 Anemia, unspecified: Secondary | ICD-10-CM | POA: Diagnosis present

## 2014-09-10 DIAGNOSIS — K219 Gastro-esophageal reflux disease without esophagitis: Secondary | ICD-10-CM | POA: Diagnosis present

## 2014-09-10 DIAGNOSIS — Z888 Allergy status to other drugs, medicaments and biological substances status: Secondary | ICD-10-CM | POA: Diagnosis not present

## 2014-09-10 DIAGNOSIS — E119 Type 2 diabetes mellitus without complications: Secondary | ICD-10-CM

## 2014-09-10 DIAGNOSIS — I350 Nonrheumatic aortic (valve) stenosis: Secondary | ICD-10-CM | POA: Diagnosis not present

## 2014-09-10 DIAGNOSIS — I6523 Occlusion and stenosis of bilateral carotid arteries: Secondary | ICD-10-CM | POA: Insufficient documentation

## 2014-09-10 DIAGNOSIS — E569 Vitamin deficiency, unspecified: Secondary | ICD-10-CM | POA: Diagnosis present

## 2014-09-10 DIAGNOSIS — Z8673 Personal history of transient ischemic attack (TIA), and cerebral infarction without residual deficits: Secondary | ICD-10-CM | POA: Diagnosis not present

## 2014-09-10 DIAGNOSIS — I10 Essential (primary) hypertension: Secondary | ICD-10-CM | POA: Diagnosis present

## 2014-09-10 DIAGNOSIS — I5032 Chronic diastolic (congestive) heart failure: Secondary | ICD-10-CM | POA: Diagnosis present

## 2014-09-10 DIAGNOSIS — E785 Hyperlipidemia, unspecified: Secondary | ICD-10-CM | POA: Diagnosis present

## 2014-09-10 DIAGNOSIS — I959 Hypotension, unspecified: Secondary | ICD-10-CM | POA: Diagnosis present

## 2014-09-10 DIAGNOSIS — H409 Unspecified glaucoma: Secondary | ICD-10-CM | POA: Diagnosis present

## 2014-09-10 DIAGNOSIS — R778 Other specified abnormalities of plasma proteins: Secondary | ICD-10-CM

## 2014-09-10 DIAGNOSIS — M1990 Unspecified osteoarthritis, unspecified site: Secondary | ICD-10-CM | POA: Diagnosis present

## 2014-09-10 DIAGNOSIS — M109 Gout, unspecified: Secondary | ICD-10-CM | POA: Diagnosis present

## 2014-09-10 DIAGNOSIS — K449 Diaphragmatic hernia without obstruction or gangrene: Secondary | ICD-10-CM | POA: Diagnosis present

## 2014-09-10 DIAGNOSIS — R21 Rash and other nonspecific skin eruption: Secondary | ICD-10-CM | POA: Diagnosis present

## 2014-09-10 DIAGNOSIS — F329 Major depressive disorder, single episode, unspecified: Secondary | ICD-10-CM | POA: Diagnosis present

## 2014-09-10 DIAGNOSIS — R197 Diarrhea, unspecified: Secondary | ICD-10-CM | POA: Diagnosis present

## 2014-09-10 DIAGNOSIS — I639 Cerebral infarction, unspecified: Secondary | ICD-10-CM

## 2014-09-10 DIAGNOSIS — R7989 Other specified abnormal findings of blood chemistry: Secondary | ICD-10-CM

## 2014-09-10 DIAGNOSIS — I214 Non-ST elevation (NSTEMI) myocardial infarction: Secondary | ICD-10-CM | POA: Diagnosis not present

## 2014-09-10 HISTORY — DX: Supraventricular tachycardia, unspecified: I47.10

## 2014-09-10 HISTORY — DX: Supraventricular tachycardia: I47.1

## 2014-09-10 HISTORY — DX: Cerebral infarction, unspecified: I63.9

## 2014-09-10 LAB — COMPREHENSIVE METABOLIC PANEL
ALBUMIN: 3.9 g/dL (ref 3.5–5.0)
ALT: 23 U/L (ref 14–54)
AST: 31 U/L (ref 15–41)
Alkaline Phosphatase: 55 U/L (ref 38–126)
Anion gap: 9 (ref 5–15)
BUN: 21 mg/dL — ABNORMAL HIGH (ref 6–20)
CALCIUM: 9.1 mg/dL (ref 8.9–10.3)
CO2: 24 mmol/L (ref 22–32)
CREATININE: 0.89 mg/dL (ref 0.44–1.00)
Chloride: 105 mmol/L (ref 101–111)
GFR calc Af Amer: >60 mL/min (ref >60)
GFR calc non Af Amer: 57 mL/min — ABNORMAL LOW (ref >60)
GLUCOSE: 156 mg/dL — AB (ref 65–99)
POTASSIUM: 3.6 mmol/L (ref 3.5–5.1)
Sodium: 138 mmol/L (ref 135–145)
Total Bilirubin: 0.9 mg/dL (ref 0.3–1.2)
Total Protein: 6.7 g/dL (ref 6.5–8.1)

## 2014-09-10 LAB — GLUCOSE, CAPILLARY
GLUCOSE-CAPILLARY: 97 mg/dL (ref 65–99)
Glucose-Capillary: 49 mg/dL — ABNORMAL LOW (ref 65–99)
Glucose-Capillary: 193 mg/dL — ABNORMAL HIGH (ref 65–99)

## 2014-09-10 LAB — CBC WITH DIFFERENTIAL/PLATELET
BASOS ABS: 0 10*3/uL (ref 0–0.1)
Basophils Relative: 0 %
EOS PCT: 0 %
Eosinophils Absolute: 0.1 10*3/uL (ref 0–0.7)
HEMATOCRIT: 36.5 % (ref 35.0–47.0)
Hemoglobin: 12.1 g/dL (ref 12.0–16.0)
LYMPHS PCT: 5 %
Lymphs Abs: 0.8 10*3/uL — ABNORMAL LOW (ref 1.0–3.6)
MCH: 30.2 pg (ref 26.0–34.0)
MCHC: 33.3 g/dL (ref 32.0–36.0)
MCV: 90.7 fL (ref 80.0–100.0)
Monocytes Absolute: 0.8 10*3/uL (ref 0.2–0.9)
Monocytes Relative: 5 %
NEUTROS ABS: 13.2 10*3/uL — AB (ref 1.4–6.5)
Neutrophils Relative %: 90 %
Platelets: 235 10*3/uL (ref 150–440)
RBC: 4.02 MIL/uL (ref 3.80–5.20)
RDW: 15.2 % — AB (ref 11.5–14.5)
WBC: 14.9 10*3/uL — ABNORMAL HIGH (ref 3.6–11.0)

## 2014-09-10 LAB — TROPONIN I
Troponin I: 0.06 ng/mL — ABNORMAL HIGH (ref <0.031)
Troponin I: 0.25 ng/mL — ABNORMAL HIGH (ref <0.031)

## 2014-09-10 MED ORDER — ASPIRIN EC 81 MG PO TBEC
81.0000 mg | DELAYED_RELEASE_TABLET | Freq: Every day | ORAL | Status: DC
  Administered 2014-09-11: 81 mg via ORAL

## 2014-09-10 MED ORDER — ASPIRIN EC 81 MG PO TBEC
81.0000 mg | DELAYED_RELEASE_TABLET | Freq: Every day | ORAL | Status: DC
  Administered 2014-09-12: 81 mg via ORAL
  Filled 2014-09-10 (×2): qty 1

## 2014-09-10 MED ORDER — HEPARIN SODIUM (PORCINE) 5000 UNIT/ML IJ SOLN
5000.0000 [IU] | Freq: Three times a day (TID) | INTRAMUSCULAR | Status: DC
  Administered 2014-09-10 – 2014-09-12 (×6): 5000 [IU] via SUBCUTANEOUS
  Filled 2014-09-10 (×6): qty 1

## 2014-09-10 MED ORDER — METOPROLOL SUCCINATE ER 50 MG PO TB24
50.0000 mg | ORAL_TABLET | Freq: Every day | ORAL | Status: DC
  Administered 2014-09-11: 50 mg via ORAL
  Filled 2014-09-10: qty 1

## 2014-09-10 MED ORDER — NITROGLYCERIN 0.4 MG SL SUBL: 0.4000 mg | SUBLINGUAL_TABLET | SUBLINGUAL | Status: DC | PRN

## 2014-09-10 MED ORDER — ASPIRIN 81 MG PO CHEW: 324.0000 mg | CHEWABLE_TABLET | Freq: Once | ORAL | Status: DC

## 2014-09-10 MED ORDER — INSULIN ASPART 100 UNIT/ML ~~LOC~~ SOLN
0.0000 [IU] | Freq: Three times a day (TID) | SUBCUTANEOUS | Status: DC
  Administered 2014-09-11: 1 [IU] via SUBCUTANEOUS
  Administered 2014-09-12: 3 [IU] via SUBCUTANEOUS
  Filled 2014-09-10: qty 1
  Filled 2014-09-10: qty 3

## 2014-09-10 MED ORDER — ACETAMINOPHEN 325 MG PO TABS: 650.0000 mg | ORAL_TABLET | ORAL | Status: DC | PRN

## 2014-09-10 MED ORDER — ONDANSETRON HCL 4 MG/2ML IJ SOLN: 4.0000 mg | Freq: Four times a day (QID) | INTRAMUSCULAR | Status: DC | PRN

## 2014-09-10 MED ORDER — PRAVASTATIN SODIUM 40 MG PO TABS
40.0000 mg | ORAL_TABLET | Freq: Every day | ORAL | Status: DC
  Administered 2014-09-10: 40 mg via ORAL
  Filled 2014-09-10 (×2): qty 1

## 2014-09-10 MED ORDER — PANTOPRAZOLE SODIUM 40 MG PO TBEC
40.0000 mg | DELAYED_RELEASE_TABLET | Freq: Two times a day (BID) | ORAL | Status: DC
  Administered 2014-09-10 – 2014-09-12 (×4): 40 mg via ORAL
  Filled 2014-09-10 (×4): qty 1

## 2014-09-10 MED ORDER — GABAPENTIN 300 MG PO CAPS
300.0000 mg | ORAL_CAPSULE | Freq: Two times a day (BID) | ORAL | Status: DC
  Administered 2014-09-10 – 2014-09-12 (×4): 300 mg via ORAL
  Filled 2014-09-10 (×4): qty 1

## 2014-09-10 MED ORDER — BRIMONIDINE TARTRATE 0.15 % OP SOLN: 1.0000 [drp] | Freq: Two times a day (BID) | OPHTHALMIC | Status: DC

## 2014-09-10 MED ORDER — VITAMIN D 1000 UNITS PO TABS
2000.0000 [IU] | ORAL_TABLET | Freq: Every day | ORAL | Status: DC
  Administered 2014-09-11 – 2014-09-12 (×2): 2000 [IU] via ORAL
  Filled 2014-09-10 (×2): qty 2

## 2014-09-10 MED ORDER — DORZOLAMIDE HCL-TIMOLOL MAL 2-0.5 % OP SOLN
1.0000 [drp] | Freq: Two times a day (BID) | OPHTHALMIC | Status: DC
  Administered 2014-09-10 – 2014-09-12 (×4): 1 [drp] via OPHTHALMIC
  Filled 2014-09-10: qty 10

## 2014-09-10 MED ORDER — LISINOPRIL 20 MG PO TABS
20.0000 mg | ORAL_TABLET | Freq: Every day | ORAL | Status: DC
Start: 2014-09-10 — End: 2014-09-12
  Administered 2014-09-11 – 2014-09-12 (×2): 20 mg via ORAL
  Filled 2014-09-10 (×2): qty 1

## 2014-09-10 MED ORDER — PILOCARPINE HCL 4 % OP SOLN
1.0000 [drp] | Freq: Four times a day (QID) | OPHTHALMIC | Status: DC
  Administered 2014-09-10 – 2014-09-12 (×8): 1 [drp] via OPHTHALMIC
  Filled 2014-09-10: qty 15

## 2014-09-10 MED ORDER — INSULIN ASPART 100 UNIT/ML ~~LOC~~ SOLN: 0.0000 [IU] | Freq: Every day | SUBCUTANEOUS | Status: DC

## 2014-09-10 MED ORDER — LATANOPROST 0.005 % OP SOLN
1.0000 [drp] | Freq: Every day | OPHTHALMIC | Status: DC
  Administered 2014-09-10 – 2014-09-11 (×2): 1 [drp] via OPHTHALMIC
  Filled 2014-09-10: qty 2.5

## 2014-09-10 MED ORDER — BRIMONIDINE TARTRATE 0.15 % OP SOLN
1.0000 [drp] | Freq: Two times a day (BID) | OPHTHALMIC | Status: DC
  Administered 2014-09-10 – 2014-09-12 (×4): 1 [drp] via OPHTHALMIC
  Filled 2014-09-10: qty 5

## 2014-09-10 NOTE — ED Notes (Signed)
Dr. Archie Balboa notified of critical trop of 0.06

## 2014-09-10 NOTE — ED Notes (Signed)
Resumed care from [facility].  md at bedside for admission of pt.  Family in with pt  nsr on monitor.  Skin warm and dry.  Alert.

## 2014-09-10 NOTE — H&P (Signed)
[facility] [place] NAME: [name]    MR#:  [id number]  DATE OF BIRTH:  Jun 01, 1924  DATE OF ADMISSION:  09/10/2014  PRIMARY CARE PHYSICIAN: [name], MD   REQUESTING/REFERRING PHYSICIAN: Dr Archie Balboa   CHIEF COMPLAINT:   Chief Complaint  Patient presents with  . Tachycardia    HISTORY OF PRESENT ILLNESS:  [name]  is a 81 y.o. female with a known history of retention, diabetes and episodic SVT who presents with dizziness and left-sided chest cramping. Her family reports that she has had a gradual decline in energy level and overall health over the past 2 months. She has had emergency room visits in May for weakness, was admitted in June for hypotension and diarrhea. For the past few days she has been doing well. This morning while cleaning her house at around 10 AM she felt acutely dizzy with cramping in the center of her chest. The cramping did not radiate. She sat down for a while but did not feel any better. She denies palpitations nausea or presyncope. Her youngest sister came to check on her and found her very sweaty and weak appearing and called EMS. On arrival she is in SVT with a rate of 133 and her troponin is slightly elevated at 0.06.  PAST MEDICAL HISTORY:   Past Medical History  Diagnosis Date  . Hypertension   . Hyperlipidemia   . Diabetes mellitus without complication   . CHF (congestive heart failure)   . GERD (gastroesophageal reflux disease)   . Glaucoma   . Heart murmur   . Depression   . Gout   . Hiatal hernia   . Bone disease   . Anemia   . Aortic stenosis   . Cervical spine arthritis   . Vitamin deficiency   . Cognitive impairment   . Renal insufficiency   . Stroke     PAST SURGICAL HISTORY:   Past Surgical History  Procedure Laterality Date  . Tubal ligation      SOCIAL HISTORY:   History  Substance Use Topics  . Smoking status: Never Smoker   . Smokeless tobacco:  Not on file  . Alcohol Use: No   Lives alone. Performs all of her own ADLs. Her sister Starr Lake is her power of attorney.  FAMILY HISTORY:   Family History  Problem Relation Age of Onset  . Glaucoma Mother   . Diabetes Mother   . Peptic Ulcer Father   . Colitis Father     DRUG ALLERGIES:   Allergies  Allergen Reactions  . Metformin And Related     REVIEW OF SYSTEMS:   Review of Systems  Constitutional: Positive for diaphoresis. Negative for fever, chills, weight loss and malaise/fatigue.  HENT: Negative for congestion and hearing loss.   Eyes: Negative for blurred vision and pain.  Respiratory: Negative for cough, hemoptysis, sputum production, shortness of breath and stridor.   Cardiovascular: Positive for chest pain. Negative for palpitations, orthopnea and leg swelling.  Gastrointestinal: Negative for nausea, vomiting, abdominal pain, diarrhea, constipation and blood in stool.  Genitourinary: Negative for dysuria and frequency.  Musculoskeletal: Negative for myalgias, back pain, joint pain and neck pain.  Skin: Positive for itching and rash.  Neurological: Positive for weakness. Negative for focal weakness, loss of consciousness and headaches.  Endo/Heme/Allergies: Does not bruise/bleed easily.  Psychiatric/Behavioral: Negative for depression and hallucinations. The patient is not nervous/anxious.     MEDICATIONS AT HOME:  Prior to Admission medications   Medication Sig Start Date End Date Taking? Authorizing Provider  aspirin 81 MG tablet Take 81 mg by mouth daily.    Historical Provider, MD  brimonidine (ALPHAGAN P) 0.1 % SOLN Place 1 drop into the right eye 2 (two) times daily.    Historical Provider, MD  brimonidine (ALPHAGAN) 0.15 % ophthalmic solution Place 1 drop into the right eye 2 (two) times daily.    Historical Provider, MD  cholecalciferol (VITAMIN D) 1000 UNITS tablet Take 2,000 Units by mouth daily.     Historical Provider, MD  ciprofloxacin  (CIPRO) 250 MG tablet Take 1 tablet (250 mg total) by mouth 2 (two) times daily. 08/29/14   Hillary Bow, MD  colchicine 0.6 MG tablet Take 0.6 mg by mouth 3 (three) times daily as needed.     Historical Provider, MD  Dorzolamide HCl-Timolol Mal (COSOPT OP) Apply to eye 2 (two) times daily.    Historical Provider, MD  gabapentin (NEURONTIN) 300 MG capsule Take 300 mg by mouth 2 (two) times daily.    Historical Provider, MD  glimepiride (AMARYL) 1 MG tablet Take 1 mg by mouth daily with breakfast.    Historical Provider, MD  iron polysaccharides (NIFEREX) 150 MG capsule Take 150 mg by mouth 2 (two) times daily.    Historical Provider, MD  latanoprost (XALATAN) 0.005 % ophthalmic solution Place 1 drop into the right eye at bedtime. 07/03/14   Historical Provider, MD  lisinopril (PRINIVIL,ZESTRIL) 40 MG tablet TAKE 1 TABLET BY MOUTH ONCE DAILY (8AM) 12/25/13   Minna Merritts, MD  lovastatin (MEVACOR) 40 MG tablet Take 40 mg by mouth at bedtime.    Historical Provider, MD  Magnesium 250 MG TABS Take 2 tablets by mouth daily.    Historical Provider, MD  metoprolol succinate (TOPROL-XL) 50 MG 24 hr tablet Take 50 mg by mouth daily. Take with or immediately following a meal.    Historical Provider, MD  pantoprazole (PROTONIX) 40 MG tablet Take 40 mg by mouth 2 (two) times daily.    Historical Provider, MD  pilocarpine (PILOCAR) 4 % ophthalmic solution Place 1 drop into the right eye 4 (four) times daily.     Historical Provider, MD  tiZANidine (ZANAFLEX) 4 MG tablet Take 1 tablet by mouth 3 (three) times daily as needed. 08/08/14   Historical Provider, MD      VITAL SIGNS:  Blood pressure 129/77, pulse 92, temperature 98.6 F (37 C), temperature source Oral, resp. rate 23, height 5\' 4"  (1.626 m), weight 70.308 kg (155 lb), SpO2 99 %.  PHYSICAL EXAMINATION:  GENERAL:  81 y.o.-year-old patient lying in the bed with no acute distress.  EYES: Pupils equal, round, reactive to light and accommodation. No  scleral icterus. Extraocular muscles intact.  HEENT: Head atraumatic, normocephalic. Oropharynx and nasopharynx clear. Edentulous, mucous membranes are moist NECK:  Supple, no jugular venous distention. No thyroid enlargement, no tenderness.  LUNGS: Normal breath sounds bilaterally, no wheezing, rales, rhonchi or crepitation. No use of accessory muscles of respiration.  CARDIOVASCULAR: S1, S2 normal. 3/6 systolic ejection murmur, no rubs, or gallops.  ABDOMEN: Soft, nontender, nondistended. Bowel sounds present. No organomegaly or mass.  EXTREMITIES: No pedal edema, cyanosis, or clubbing.  NEUROLOGIC: Cranial nerves II through XII are intact. Muscle strength 5/5 in all extremities. Sensation intact. Gait not checked.  PSYCHIATRIC: The patient is alert and oriented x 3.  SKIN: Papular rash over the right leg right side of the trunk and back  and also over the left lower leg with small fluid-filled pustules  LABORATORY PANEL:   CBC  Recent Labs Lab 09/10/14 1345  WBC 14.9*  HGB 12.1  HCT 36.5  PLT 235   ------------------------------------------------------------------------------------------------------------------  Chemistries   Recent Labs Lab 09/10/14 1345  NA 138  K 3.6  CL 105  CO2 24  GLUCOSE 156*  BUN 21*  CREATININE 0.89  CALCIUM 9.1  AST 31  ALT 23  ALKPHOS 55  BILITOT 0.9   ------------------------------------------------------------------------------------------------------------------  Cardiac Enzymes  Recent Labs Lab 09/10/14 1345  TROPONINI 0.06*   ------------------------------------------------------------------------------------------------------------------  RADIOLOGY:  No results found.  EKG:   Orders placed or performed during the hospital encounter of 07/19/14  . ED EKG  . ED EKG  . EKG   SVT rate of 133, repeat EKG pending  IMPRESSION AND PLAN:   Principal Problem:   NSTEMI (non-ST elevated myocardial infarction) Active  Problems:   SVT (supraventricular tachycardia)   Diabetes mellitus, type 2   Essential hypertension, malignant   Chronic diastolic CHF (congestive heart failure)  #1 non-ST elevation MI: Very mildly elevated troponin could possibly be an STEMI. More likely is due to SVT earlier today. Will admit to telemetry, cycle cardiac enzymes and obtain a 2-D echocardiogram. Will hold off on therapeutic anticoagulation. She has received aspirin 325 in the emergency room. We'll continue her home medications including metoprolol, lovastatin, lisinopril, aspirin 81 mg daily. SHe has been taking a prednisone taper due to a skin rash, this may precipitated her tachycardia. We'll hold off on cardiology consultation unless troponins escalate or she has recurrent arrhythmia.  #2 SVT: Patient has a history of paroxysmal SVT. She has converted spontaneously to normal sinus rhythm. Continue to monitor on telemetry.  #3 hypertension: Blood pressure currently well controlled. No changes to home medications  #4 diabetes mellitus type 2: Check hemoglobin A1c and start sliding scale. Initial blood sugar fairly elevated at 156  #5 weakness and general decline: Her CT scan from last admission shows a small thalamic infarct. The patient and her family state that they were not aware of this finding, but they do think she has had overall decline over the past 2 months. I will get a physical therapy consultation while she is here to assess for any additional needs. Check lipids and hemoglobin A1c. 2-D echocardiogram pending, I will also get carotid Dopplers. It seems that she may have had the initial stroke while she was on aspirin, further anticoagulation could be considered.  #6 skin rash: Viral versus an atopic dermatitis. Continue with topical treatment. The rash does not seem to be bothering her at this time. No skin infection. No fevers. Hold off on further systemic steroid.  Prophylaxis: Heparin for DVT prophylaxis and  continue PPI as she is on 1 at home  All the records are reviewed and case discussed with ED provider. Management plans discussed with the patient, family and they are in agreement.  CODE STATUS: Full. This was confirmed with the patient and her sisters on admission. Her power of attorney is her youngest sister Starr Lake.  TOTAL TIME TAKING CARE OF THIS PATIENT: 55 minutes.  Greater than 50% of time spent in coordination of care and counseling.  Myrtis Ser M.D on 09/10/2014 at 3:39 PM  Between 7am to 6pm - Pager - [phone]  After 6pm go to [email or web address] - password [email or web address] [place] [facility]  Office  [phone]  CC: Primary care physician; [name], MD

## 2014-09-10 NOTE — ED Provider Notes (Signed)
[facility] Emergency Department Provider Note    ____________________________________________  Time seen: 1426  I have reviewed the triage vital signs and the nursing notes.   HISTORY  Chief Complaint Tachycardia   History limited by: Not Limited   HPI [name] is a 81 y.o. female who presents to the emergency department today via EMS because of tachycardia, chest discomfort and dizziness. The patient states that the symptoms started roughly between 10 and 11:00 this morning when she was doing housework. She describes the chest discomfort as being located centrally. The patient states that the chest pain and dizziness have now resolved. She has had similar symptoms in the past. Denies any recent fevers, nausea, vomiting or abdominal pain.     Past Medical History  Diagnosis Date  . Hypertension   . Hyperlipidemia   . Diabetes mellitus without complication   . CHF (congestive heart failure)   . GERD (gastroesophageal reflux disease)   . Glaucoma   . Heart murmur   . Depression   . Gout   . Hiatal hernia   . Bone disease   . Anemia   . Aortic stenosis   . Cervical spine arthritis   . Vitamin deficiency   . Cognitive impairment   . Renal insufficiency     Patient Active Problem List   Diagnosis Date Noted  . Renal failure 08/28/2014  . SVT (supraventricular tachycardia) 12/17/2012  . Diabetes mellitus, type 2 12/17/2012  . SOB (shortness of breath) 12/17/2012  . Essential hypertension, malignant 12/17/2012  . Chronic diastolic CHF (congestive heart failure) 12/17/2012    Past Surgical History  Procedure Laterality Date  . Tubal ligation      Current Outpatient Rx  Name  Route  Sig  Dispense  Refill  . aspirin 81 MG tablet   Oral   Take 81 mg by mouth daily.         . brimonidine (ALPHAGAN P) 0.1 % SOLN   Right Eye   Place 1 drop into the right eye 2 (two) times daily.         . brimonidine (ALPHAGAN) 0.15 %  ophthalmic solution   Right Eye   Place 1 drop into the right eye 2 (two) times daily.         . cholecalciferol (VITAMIN D) 1000 UNITS tablet   Oral   Take 2,000 Units by mouth daily.          . ciprofloxacin (CIPRO) 250 MG tablet   Oral   Take 1 tablet (250 mg total) by mouth 2 (two) times daily.   8 tablet   0   . colchicine 0.6 MG tablet   Oral   Take 0.6 mg by mouth 3 (three) times daily as needed.          . Dorzolamide HCl-Timolol Mal (COSOPT OP)   Ophthalmic   Apply to eye 2 (two) times daily.         Marland Kitchen gabapentin (NEURONTIN) 300 MG capsule   Oral   Take 300 mg by mouth 2 (two) times daily.         Marland Kitchen glimepiride (AMARYL) 1 MG tablet   Oral   Take 1 mg by mouth daily with breakfast.         . iron polysaccharides (NIFEREX) 150 MG capsule   Oral   Take 150 mg by mouth 2 (two) times daily.         Marland Kitchen latanoprost (XALATAN) 0.005 %  ophthalmic solution   Right Eye   Place 1 drop into the right eye at bedtime.      3   . lisinopril (PRINIVIL,ZESTRIL) 40 MG tablet      TAKE 1 TABLET BY MOUTH ONCE DAILY (8AM)   90 tablet   3   . lovastatin (MEVACOR) 40 MG tablet   Oral   Take 40 mg by mouth at bedtime.         . Magnesium 250 MG TABS   Oral   Take 2 tablets by mouth daily.         . metoprolol succinate (TOPROL-XL) 50 MG 24 hr tablet   Oral   Take 50 mg by mouth daily. Take with or immediately following a meal.         . pantoprazole (PROTONIX) 40 MG tablet   Oral   Take 40 mg by mouth 2 (two) times daily.         . pilocarpine (PILOCAR) 4 % ophthalmic solution   Right Eye   Place 1 drop into the right eye 4 (four) times daily.          Marland Kitchen tiZANidine (ZANAFLEX) 4 MG tablet   Oral   Take 1 tablet by mouth 3 (three) times daily as needed.      1     Allergies Metformin and related  Family History  Problem Relation Age of Onset  . Glaucoma Mother   . Peptic Ulcer Father   . Colitis Father     Social History History   Substance Use Topics  . Smoking status: Never Smoker   . Smokeless tobacco: Not on file  . Alcohol Use: No    Review of Systems  Constitutional: Negative for fever. Cardiovascular: Positive for chest discomfort. Respiratory: Negative for shortness of breath. Gastrointestinal: Negative for abdominal pain, vomiting and diarrhea. Genitourinary: Negative for dysuria. Musculoskeletal: Negative for back pain. Skin: Negative for rash. Neurological: Positive for dizziness   10-point ROS otherwise negative.  ____________________________________________   PHYSICAL EXAM:  VITAL SIGNS: ED Triage Vitals  Enc Vitals Group     BP 09/10/14 1341 136/80 mmHg     Pulse Rate 09/10/14 1341 97     Resp 09/10/14 1341 20     Temp 09/10/14 1341 98.6 F (37 C)     Temp Source 09/10/14 1341 Oral     SpO2 09/10/14 1341 100 %     Weight 09/10/14 1341 155 lb (70.308 kg)     Height 09/10/14 1341 5\' 4"  (1.626 m)   Constitutional: Alert and oriented. Well appearing and in no distress. Eyes: Conjunctivae are normal. PERRL. Normal extraocular movements. ENT   Head: Normocephalic and atraumatic.   Nose: No congestion/rhinnorhea.   Mouth/Throat: Mucous membranes are moist.   Neck: No stridor. Hematological/Lymphatic/Immunilogical: No cervical lymphadenopathy. Cardiovascular: Normal rate, regular rhythm.  No murmurs, rubs, or gallops. Respiratory: Normal respiratory effort without tachypnea nor retractions. Breath sounds are clear and equal bilaterally. No wheezes/rales/rhonchi. Gastrointestinal: Soft and nontender. No distention.  Genitourinary: Deferred Musculoskeletal: Normal range of motion in all extremities. No joint effusions.  No lower extremity tenderness nor edema. Neurologic:  Normal speech and language. No gross focal neurologic deficits are appreciated. Speech is normal.  Skin:  Skin is warm, dry and intact. No rash noted. Psychiatric: Mood and affect are normal. Speech and  behavior are normal. Patient exhibits appropriate insight and judgment.  ____________________________________________    LABS (pertinent positives/negatives)  Labs Reviewed  TROPONIN I -  Abnormal; Notable for the following:    Troponin I 0.06 (*)    All other components within normal limits  COMPREHENSIVE METABOLIC PANEL - Abnormal; Notable for the following:    Glucose, Bld 156 (*)    BUN 21 (*)    GFR calc non Af Amer 57 (*)    All other components within normal limits  CBC WITH DIFFERENTIAL/PLATELET - Abnormal; Notable for the following:    WBC 14.9 (*)    RDW 15.2 (*)    Neutro Abs 13.2 (*)    Lymphs Abs 0.8 (*)    All other components within normal limits     ____________________________________________   EKG  I, Nance Pear, attending physician, personally viewed and interpreted this EKG  EKG Time: 1336 Rate: 133 Rhythm: SVT Axis: Left axis Intervals: qtc 449 QRS: Narrow ST changes: No ST elevation   ____________________________________________    RADIOLOGY  None  ____________________________________________   PROCEDURES  Procedure(s) performed: None  Critical Care performed: No  ____________________________________________   INITIAL IMPRESSION / ASSESSMENT AND PLAN / ED COURSE  Pertinent labs & imaging results that were available during my care of the patient were reviewed by me and considered in my medical decision making (see chart for details).  Patient presented to the emergency department today because of tachycardia, chest discomfort and dizziness. Patient was actually in SVT however spontaneously reverted back to normal sinus rhythm. Lab work concerning for elevated troponin. Question ACS versus demand ischemia. Patient did take 324 aspirin before arrival to the emergency department. Will admit to the hospital for further workup  ____________________________________________   FINAL CLINICAL IMPRESSION(S) / ED DIAGNOSES  Final  diagnoses:  SVT (supraventricular tachycardia)  Elevated troponin     Nance Pear, MD 09/10/14 1519

## 2014-09-10 NOTE — ED Notes (Signed)
Pt took 324 ASa before ER arrival

## 2014-09-10 NOTE — ED Notes (Signed)
Pt arrives via EMS from home, pt complained of chest pain but upon EMS arrival pt HR was in the 160s, pt states she has no been eating or drinking like she should, EMS gave 500cc NS bolus, pt arrives with no complaints of pain at this time

## 2014-09-11 ENCOUNTER — Encounter: Payer: Self-pay | Admitting: Physician Assistant

## 2014-09-11 ENCOUNTER — Inpatient Hospital Stay: Payer: Medicare HMO

## 2014-09-11 DIAGNOSIS — I471 Supraventricular tachycardia: Principal | ICD-10-CM

## 2014-09-11 DIAGNOSIS — I214 Non-ST elevation (NSTEMI) myocardial infarction: Secondary | ICD-10-CM

## 2014-09-11 LAB — GLUCOSE, CAPILLARY
GLUCOSE-CAPILLARY: 67 mg/dL (ref 65–99)
GLUCOSE-CAPILLARY: 124 mg/dL — AB (ref 65–99)
Glucose-Capillary: 114 mg/dL — ABNORMAL HIGH (ref 65–99)
Glucose-Capillary: 138 mg/dL — ABNORMAL HIGH (ref 65–99)
Glucose-Capillary: 75 mg/dL (ref 65–99)

## 2014-09-11 LAB — BASIC METABOLIC PANEL
Anion gap: 5 (ref 5–15)
BUN: 20 mg/dL (ref 6–20)
CHLORIDE: 108 mmol/L (ref 101–111)
CO2: 26 mmol/L (ref 22–32)
Calcium: 8.8 mg/dL — ABNORMAL LOW (ref 8.9–10.3)
Creatinine, Ser: 0.81 mg/dL (ref 0.44–1.00)
GFR calc Af Amer: >60 mL/min (ref >60)
Glucose, Bld: 114 mg/dL — ABNORMAL HIGH (ref 65–99)
POTASSIUM: 3.7 mmol/L (ref 3.5–5.1)
SODIUM: 139 mmol/L (ref 135–145)

## 2014-09-11 LAB — CBC
HEMATOCRIT: 34.3 % — AB (ref 35.0–47.0)
Hemoglobin: 11.4 g/dL — ABNORMAL LOW (ref 12.0–16.0)
MCH: 30.3 pg (ref 26.0–34.0)
MCHC: 33.3 g/dL (ref 32.0–36.0)
MCV: 90.7 fL (ref 80.0–100.0)
PLATELETS: 201 10*3/uL (ref 150–440)
RBC: 3.78 MIL/uL — AB (ref 3.80–5.20)
RDW: 15.1 % — ABNORMAL HIGH (ref 11.5–14.5)
WBC: 12 10*3/uL — ABNORMAL HIGH (ref 3.6–11.0)

## 2014-09-11 LAB — LIPID PANEL
Cholesterol: 114 mg/dL (ref 0–200)
HDL: 40 mg/dL — AB (ref >40)
LDL Cholesterol: 55 mg/dL (ref 0–99)
TRIGLYCERIDES: 97 mg/dL (ref <150)
Total CHOL/HDL Ratio: 2.9 RATIO
VLDL: 19 mg/dL (ref 0–40)

## 2014-09-11 LAB — TROPONIN I: Troponin I: 0.18 ng/mL — ABNORMAL HIGH (ref <0.031)

## 2014-09-11 LAB — HEMOGLOBIN A1C: Hgb A1c MFr Bld: 7 % — ABNORMAL HIGH (ref 4.0–6.0)

## 2014-09-11 LAB — TSH: TSH: 0.471 u[IU]/mL (ref 0.350–4.500)

## 2014-09-11 MED ORDER — SODIUM CHLORIDE 0.9 % IJ SOLN
3.0000 mL | Freq: Two times a day (BID) | INTRAMUSCULAR | Status: DC
  Administered 2014-09-11 – 2014-09-12 (×2): 3 mL via INTRAVENOUS

## 2014-09-11 MED ORDER — DIPHENHYDRAMINE HCL 25 MG PO CAPS
25.0000 mg | ORAL_CAPSULE | ORAL | Status: DC | PRN
  Administered 2014-09-11 – 2014-09-12 (×3): 25 mg via ORAL
  Filled 2014-09-11 (×3): qty 1

## 2014-09-11 MED ORDER — ENSURE ENLIVE PO LIQD
237.0000 mL | Freq: Two times a day (BID) | ORAL | Status: DC
  Administered 2014-09-11 – 2014-09-12 (×2): 237 mL via ORAL

## 2014-09-11 MED ORDER — SODIUM CHLORIDE 0.9 % IJ SOLN: 3.0000 mL | INTRAMUSCULAR | Status: DC | PRN

## 2014-09-11 MED ORDER — METOPROLOL SUCCINATE ER 50 MG PO TB24
75.0000 mg | ORAL_TABLET | Freq: Every day | ORAL | Status: DC
  Administered 2014-09-12: 75 mg via ORAL
  Filled 2014-09-11: qty 1

## 2014-09-11 MED ORDER — ATORVASTATIN CALCIUM 20 MG PO TABS
40.0000 mg | ORAL_TABLET | Freq: Every day | ORAL | Status: DC
  Administered 2014-09-11: 40 mg via ORAL
  Filled 2014-09-11: qty 2

## 2014-09-11 NOTE — Progress Notes (Signed)
Inpatient Diabetes Program Recommendations  AACE/ADA: New Consensus Statement on Inpatient Glycemic Control (2013)  Target Ranges:  Prepandial:   less than 140 mg/dL      Peak postprandial:   less than 180 mg/dL (1-2 hours)      Critically ill patients:  140 - 180 mg/dL   Reason for assessment: elevated CBG  Diabetes history: Type 2 Outpatient Diabetes medications: Amaryl 1mg /day Current orders for Inpatient glycemic control: Novolog correction 0-9 units tid  Blood sugars ideal.  Please consider discontinuing Amaryl on discharge as it possess a risk for hypoglycemia.   Gentry Fitz, RN, BA, MHA, CDE Diabetes Coordinator Inpatient Diabetes Program  [phone] (Team Pager) [phone] ([place]) 09/11/2014 1:17 PM

## 2014-09-11 NOTE — Progress Notes (Signed)
[facility] [place] NAME: [name]    MR#:  [id number]  DATE OF BIRTH:  [birth year]  SUBJECTIVE:  CHIEF COMPLAINT:   Chief Complaint  Patient presents with  . Tachycardia    Feels better- no complains.  REVIEW OF SYSTEMS:  CONSTITUTIONAL: No fever, positive fatigue or weakness.  EYES: No blurred or double vision.  EARS, NOSE, AND THROAT: No tinnitus or ear pain.  RESPIRATORY: No cough, shortness of breath, wheezing or hemoptysis.  CARDIOVASCULAR: positive chest pain, orthopnea, edema.  GASTROINTESTINAL: No nausea, vomiting, diarrhea or abdominal pain.  GENITOURINARY: No dysuria, hematuria.  ENDOCRINE: No polyuria, nocturia,  HEMATOLOGY: No anemia, easy bruising or bleeding SKIN: No rash or lesion. MUSCULOSKELETAL: No joint pain or arthritis.   NEUROLOGIC: No tingling, numbness, weakness.  PSYCHIATRY: No anxiety or depression.   ROS  DRUG ALLERGIES:   Allergies  Allergen Reactions  . Metformin And Related     VITALS:  Blood pressure 138/70, pulse 66, temperature 97.9 F (36.6 C), temperature source Oral, resp. rate 22, height 5\' 4"  (1.626 m), weight 70.126 kg (154 lb 9.6 oz), SpO2 99 %.  PHYSICAL EXAMINATION:  GENERAL:  81 y.o.-year-old patient lying in the bed with no acute distress.  EYES: Pupils equal, round, reactive to light and accommodation. No scleral icterus. Extraocular muscles intact.  HEENT: Head atraumatic, normocephalic. Oropharynx and nasopharynx clear. adentulous NECK:  Supple, no jugular venous distention. No thyroid enlargement, no tenderness.  LUNGS: Normal breath sounds bilaterally, no wheezing, rales,rhonchi or crepitation. No use of accessory muscles of respiration.  CARDIOVASCULAR: S1, S2 normal. No murmurs, rubs, or gallops.  ABDOMEN: Soft, nontender, nondistended. Bowel sounds present. No organomegaly or mass.  EXTREMITIES: No pedal edema, cyanosis, or clubbing.  NEUROLOGIC: Cranial nerves  II through XII are intact. Muscle strength 5/5 in all extremities. Sensation intact. Gait not checked.  PSYCHIATRIC: The patient is alert and oriented x 3.  SKIN: No obvious rash, lesion, or ulcer.   Physical Exam LABORATORY PANEL:   CBC  Recent Labs Lab 09/11/14 0246  WBC 12.0*  HGB 11.4*  HCT 34.3*  PLT 201   ------------------------------------------------------------------------------------------------------------------  Chemistries   Recent Labs Lab 09/10/14 1345 09/11/14 0246  NA 138 139  K 3.6 3.7  CL 105 108  CO2 24 26  GLUCOSE 156* 114*  BUN 21* 20  CREATININE 0.89 0.81  CALCIUM 9.1 8.8*  AST 31  --   ALT 23  --   ALKPHOS 55  --   BILITOT 0.9  --    ------------------------------------------------------------------------------------------------------------------  Cardiac Enzymes  Recent Labs Lab 09/10/14 1931 09/11/14 0246  TROPONINI 0.25* 0.18*   ------------------------------------------------------------------------------------------------------------------  RADIOLOGY:  US Carotid Bilateral  09/11/2014   CLINICAL DATA:  Stroke.  EXAM: BILATERAL CAROTID DUPLEX ULTRASOUND  TECHNIQUE: Pearline Cables scale imaging, color Doppler and duplex ultrasound were performed of bilateral carotid and vertebral arteries in the neck.  COMPARISON:  Neck CT of 09/24/2012.  FINDINGS: Criteria: Quantification of carotid stenosis is based on velocity parameters that correlate the residual internal carotid diameter with NASCET-based stenosis levels, using the diameter of the distal internal carotid lumen as the denominator for stenosis measurement.  The following velocity measurements were obtained:  RIGHT  ICA:  61/10 cm/sec  CCA:  [id number] cm/sec  SYSTOLIC ICA/CCA RATIO:  1.3  DIASTOLIC ICA/CCA RATIO:  1.2  ECA:  73 cm/sec  LEFT  ICA:  57/18 cm/sec  CCA:  0000000 cm/sec  SYSTOLIC ICA/CCA RATIO:  1.1  DIASTOLIC ICA/CCA RATIO:  1.7  ECA:  86 cm/sec  RIGHT CAROTID ARTERY: Mild right carotid  bifurcation atherosclerotic vascular plaque. No flow limiting stenosis. Waveforms unremarkable.  RIGHT VERTEBRAL ARTERY:  Patent with antegrade flow.  LEFT CAROTID ARTERY: Mild left carotid atherosclerotic vascular plaque. No flow limiting stenosis. Waveforms unremarkable.  LEFT VERTEBRAL ARTERY:  Patent with antegrade flow.  Incidental note is made of a heterogeneous enlarged thyroid gland. This is most consistent multinodular goiter and is stable from prior neck CT of 09/24/2012.  IMPRESSION: 1. Mild bilateral carotid atherosclerotic vascular disease. No flow limiting stenosis. Degree of stenosis less than 50%.  2.  Vertebral arteries are patent with antegrade flow.  3. Incidental note is made of multinodular goiter. Similar findings noted on prior neck CT of 06/30/ 2014.   Electronically Signed   By: Marcello Moores  Register   On: 09/11/2014 10:54    ASSESSMENT AND PLAN:   #1 Elevated Troponin :  Mostly it is due to SVT episode and tachycardia. It pleatued. Will not give anticoagulation for now. continue her home medications including metoprolol, lovastatin, lisinopril, aspirin 81 mg daily. SHe has been taking a prednisone taper due to a skin rash, this may precipitated her tachycardia.  Call cardiology consult, as had SVT, and elevated troponin.  Get Echo.  #2 SVT: Patient has a history of paroxysmal SVT. She has converted spontaneously to normal sinus rhythm. Continue to monitor on telemetry.  She has multi nodules on thyroid, get TSH.  #3 hypertension: Blood pressure currently well controlled. No changes to home medications  #4 diabetes mellitus type 2: Check hemoglobin A1c and start sliding scale.   #5 weakness and general decline: Her CT scan from last admission shows a small thalamic infarct. The patient and her family state that they were not aware of this finding, but they do think she has had overall decline over the past 2 months.   get a physical therapy consultation while she is here to  assess for any additional needs. Check lipids and hemoglobin A1c. 2-D echocardiogram pending,   get carotid Dopplers.   #6 skin rash: Viral versus an atopic dermatitis. Continue with topical treatment. The rash does not seem to be bothering her at this time. No skin infection. No fevers. Hold off on further systemic steroid.   All the records are reviewed and case discussed with Care Management/Social Workerr. Management plans discussed with the patient, family and they are in agreement.  CODE STATUS: full  TOTAL TIME TAKING CARE OF THIS PATIENT: 35 minutes.   More than 50% of the visit was spent in counseling/coordination of care  POSSIBLE D/C IN 1-2 DAYS, DEPENDING ON CLINICAL CONDITION.   Vaughan Basta M.D on 09/11/2014   Between 7am to 6pm - Pager - [phone]  After 6pm go to [email or web address] - password [email or web address] [facility]  Office  [phone]  CC: Primary care physician; [name], MD

## 2014-09-11 NOTE — Care Management (Signed)
Patient presented from home with dizziness and chest cramping.  Upon arrival to ED was found to be in SVT but spontaneously  converted to NSR in the ED.  It is thought this could have been related to a recent prednisone taper patient took for a rash.  Daughter [name] is present.  Prior to this episode of illness, patient lived alone, independent in all her adls and has lifeline.  She does not use any assistive ambulation device.  Her PCP Hilda Blades Tegeler is located in [facility] and patient is current with her appointments.  Patient resides in [place].  Confirmed contact and demographic information.  Denies having any problems with transportation, accessing medical care or obtaining medications.  It does not appear patient received any  IV meds for arrhythmia.  Heart rate and telemetry is stable.  Physical therapy has recommended home health physical therapy at discharge.  There is no need for medical equipment.  Patient will also benefit from home health nurse.

## 2014-09-11 NOTE — Progress Notes (Signed)
Notified Dr.Hower per patient request for anti itch medication for rash on lower legs. New order received.

## 2014-09-11 NOTE — Evaluation (Signed)
Physical Therapy Evaluation Patient Details Name: SUMAIA VELADOR MRN: [id number] DOB: December 26, 1924 Today's Date: 09/11/2014   History of Present Illness  Pt is an 81 y.o. female presenting to hospital with dizziness, L sided chest cramping, and tachycardia and admitted to [facility] with NSTEMI.  Troponin peak 0.25 and downtrending.  Per chart, family reports pt with gradual decline in energy level and overall health past 2 months.  Pt with recent admission in June for hypotension and diarrhea.  Clinical Impression  Currently pt demonstrates some limitations/impairments with functional mobility.  Prior to admission, pt was independent without AD but pt's daughter reporting during session that pt appears to require increased time to perform activities now.  Pt lives alone.  Currently pt is SBA to CGA with ambulation no AD (some gait impairments noted but no loss of balance requiring assist to steady).  Pt would benefit from skilled PT to address above noted impairments/functional limitations for safe discharge home.  Recommend pt discharge to home with HHPT (for home safety) when medically appropriate.     Follow Up Recommendations Home health PT (home safety)    Equipment Recommendations  None recommended by PT    Recommendations for Other Services       Precautions / Restrictions Precautions Precautions: Fall Restrictions Weight Bearing Restrictions: No      Mobility  Bed Mobility Overal bed mobility: Modified Independent                Transfers Overall transfer level: Needs assistance Equipment used: None Transfers: Sit to/from Stand;Stand Pivot Transfers Sit to Stand: Supervision Stand pivot transfers: Min guard       General transfer comment: No loss of balance noted with transfers  Ambulation/Gait Ambulation/Gait assistance: Supervision;Min guard Ambulation Distance (Feet): 150 Feet Assistive device: None       General Gait Details: occasional path veering and  increased BOS observed but no loss of balance noted  Stairs            Wheelchair Mobility    Modified Rankin (Stroke Patients Only)       Balance Overall balance assessment: Needs assistance Sitting-balance support: No upper extremity supported Sitting balance-Leahy Scale: Normal     Standing balance support: No upper extremity supported Standing balance-Leahy Scale: Good                               Pertinent Vitals/Pain Pain Assessment: No/denies pain  Vitals stable and WFL throughout treatment session (HR 79 bpm with activity).     Home Living Family/patient expects to be discharged to:: Private residence Living Arrangements: Alone   Type of Home: House Home Access: Stairs to enter   [employer] of Steps: 3 STE with L rail into home and 2 steps down (no rail) to pt's closet. Home Layout: One level Home Equipment: None      Prior Function Level of Independence: Independent               Hand Dominance        Extremity/Trunk Assessment   Upper Extremity Assessment: Overall WFL for tasks assessed           Lower Extremity Assessment: Overall WFL for tasks assessed         Communication   Communication: No difficulties  Cognition Arousal/Alertness: Awake/alert Behavior During Therapy: WFL for tasks assessed/performed Overall Cognitive Status: Within Functional Limits for tasks assessed  General Comments   Nursing cleared pt for participation in physical therapy.  Pt agreeable to PT session.  Pt's daughter present beginning of session.     Exercises   Performed semi-supine B LE therapeutic exercise x 10 reps:  Ankle pumps (AROM B LE's); quad sets x3 second holds (AROM B LE's); glute squeezes x3 second holds (AROM B); hip aDduction isometrics (pillow between pt's knees) x3 second holds; SAQ's (AROM R; AROM L); heelslides (AROM R; AROM L), hip abd/adduction (AROM R; AROM L).  Pt  required vc's and tactile cues for correct technique with exercises.       Assessment/Plan    PT Assessment Patient needs continued PT services  PT Diagnosis Difficulty walking   PT Problem List Decreased balance;Decreased mobility  PT Treatment Interventions Gait training;Stair training;Functional mobility training;Therapeutic activities;Therapeutic exercise;Balance training;Patient/family education;DME instruction   PT Goals (Current goals can be found in the Care Plan section) Acute Rehab PT Goals Patient Stated Goal: To go home PT Goal Formulation: With patient Time For Goal Achievement: 09/25/14 Potential to Achieve Goals: Good    Frequency Min 2X/week   Barriers to discharge        Co-evaluation               End of Session Equipment Utilized During Treatment: Gait belt Activity Tolerance: Patient tolerated treatment well Patient left: in bed;with call bell/phone within reach;with bed alarm set           Time: [id number] PT Time Calculation (min) (ACUTE ONLY): 24 min   Charges:   PT Evaluation $Initial PT Evaluation Tier I: 1 Procedure PT Treatments $Therapeutic Exercise: 8-22 mins   PT G CodesLeitha Bleak 21-Sep-2014, 11:34 AM Leitha Bleak, [place]

## 2014-09-11 NOTE — Progress Notes (Signed)
Initial Nutrition Assessment    INTERVENTION:   Meals and Snacks: Cater to patient preferences, will send bedtime snacks, supplement between meals Medical Food Supplement Therapy: recommend Ensure Enlive po BID, each supplement provides 350 kcal and 20 grams of protein   NUTRITION DIAGNOSIS:   Reassess on follow-up  GOAL:   Patient will meet greater than or equal to 90% of their needs   MONITOR:    (Energy Intake, Anthropometrics, Electrolyte/Renal Profile, GLucose Profile, Digestive System,)  REASON FOR ASSESSMENT:    (RD screen, nsg request)    ASSESSMENT:     Pt admitted with NSTEMI, SVT, weakness, general decline; noted episodes of hypoglycemia  Past Medical History  Diagnosis Date  . Hypertension   . Hyperlipidemia   . Diabetes mellitus without complication   . CHF (congestive heart failure)   . GERD (gastroesophageal reflux disease)   . Glaucoma   . Heart murmur   . Depression   . Gout   . Hiatal hernia   . Bone disease   . Anemia   . Aortic stenosis   . Cervical spine arthritis   . Vitamin deficiency   . Cognitive impairment   . Renal insufficiency   . Stroke       Diet Order:  Diet Heart Room service appropriate?: Yes; Fluid consistency:: Thin    Current Nutrition: recorded po intake 100% of meals, pt reports she did eat some lunch and breakfast today, although family reports this has been bites   Medications: lipitor, ss novolog  Electrolyte/Renal Profile and Glucose Profile:   Recent Labs Lab 09/10/14 1345 09/11/14 0246  NA 138 139  K 3.6 3.7  CL 105 108  CO2 24 26  BUN 21* 20  CREATININE 0.89 0.81  CALCIUM 9.1 8.8*  GLUCOSE 156* 114*   Protein Profile:   Recent Labs Lab 09/10/14 1345  ALBUMIN 3.9   Glucose Profile:   Recent Labs  09/11/14 0748 09/11/14 0857 09/11/14 1152  GLUCAP 67 114* 138*     Nutrition-Focused Physical Exam Findings:  Unable to complete Nutrition-Focused physical exam at this time.     Weight Trend since Admission: Filed Weights   09/10/14 1341 09/10/14 1631 09/11/14 0338  Weight: 155 lb (70.308 kg) 148 lb 1.6 oz (67.178 kg) 154 lb 9.6 oz (70.126 kg)     Height:   Ht Readings from Last 1 Encounters:  09/10/14 5\' 4"  (1.626 m)    Weight:   Wt Readings from Last 1 Encounters:  09/11/14 154 lb 9.6 oz (70.126 kg)    Filed Weights   09/10/14 1341 09/10/14 1631 09/11/14 0338  Weight: 155 lb (70.308 kg) 148 lb 1.6 oz (67.178 kg) 154 lb 9.6 oz (70.126 kg)     Wt Readings from Last 10 Encounters:  09/11/14 154 lb 9.6 oz (70.126 kg)  08/27/14 140 lb (63.504 kg)  07/19/14 152 lb (68.947 kg)  04/05/13 141 lb 4 oz (64.071 kg)  12/17/12 153 lb (69.4 kg)    BMI:  Body mass index is 26.52 kg/(m^2).  LOW Care Level  Kerman Passey MS, [place], LDN [phone] Pager

## 2014-09-11 NOTE — Consult Note (Signed)
Cardiology Consultation Note  Patient ID: [name], MRN: [id number], DOB/AGE: Nov 04, 1924 81 y.o. Admit date: 09/10/2014   Date of Consult: 09/11/2014 Primary Physician: [name], MD Primary Cardiologist: Minna Merritts, MD  Chief Complaint: Dizziness and chest pain Reason for Consult: Elevated troponin (0.06)  HPI: 81 y.o. female with a hx of paroxysmal SVT (found on 09/20/12, started on metoprolol and Cardizem), HTN, chronic diastolic CHF, and DM2 who presented via ambulance to the ED on 09/10/14 with complaints of dizziness, sweating, palpitations, and a cramping sensation across her chest that lasted an hour or more that began after she had been exerting herself doing housework, found to be in SVT with HR in the 130's.  She was recently admitted to [facility] in June for hypotension in the setting of GI illness and diarrhea. This was preceded in May be ED visit for weakness. Some of her family feels like she has had a gradual decline in energy level and overall health over the past 2 months, though the patient herself and one sister, who is currently in the room, both feel the patient is doing quite well for herself.   Prior echo during admission on 08/2012 during which she first experienced SVT in the setting of UTI and hypokalemia showed ejection fraction 0000000, diastolic dysfunction, mild LVH, moderate aortic valve stenosis, normal RV function and size. She has been well controlled on Toprol XL 50 mg daily. She has a known enlarged thyroid confirmed on prior CT of neck in 07/2012.      She lives a fairly active lifestyle per the patient and her sister. She is never just sitting. 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 On the morning of 7/13 she was sweeping and developed a dizzy sensation with cramping in the center of her chest with associated palpitations. No associated nausea, vomiting, diaphoresis, presyncope, or syncope. She was brought to [facility] for further evaluation.      On arrival to the ED on 09/10/14 she was in SVT with a rate of 133 and her troponin was slightly elevated at 0.06-->0.25-->0.18. EKG showed SVT 133 bpm, left axis deviation, nonspecific inferolateral st/t changes. CXR not done. It was noted she had a new, though not acute prior lacunar infarct on CT head during her prior admission that the family was unaware of. Thus, carotid dopplers were done which showed <50% stenosis bilaterally. TSH pending. WBC 14.9-->12.0. She spontaneously converted to NSR on her own and remains in NSR with HR in the 70's.        Today (7/14) she denies chest pain, shortness of breath, fatigue, weakness, palpitations, leg swelling, abdominal pain, N/V/D, or dizziness.    Past Medical History  Diagnosis Date  . Hypertension   . Hyperlipidemia   . Diabetes mellitus without complication   . Diastolic dysfunction     a. echo 2014: EF 55-60%, DD, mild LVH, moderate AS  . GERD (gastroesophageal reflux disease)   . Glaucoma   . Depression   . Gout   . Hiatal hernia   . Bone disease   . Anemia   . Aortic stenosis   . Cervical spine arthritis   . Vitamin deficiency   . Cognitive impairment   . Renal insufficiency   . Stroke   . Paroxysmal SVT (supraventricular tachycardia)       Most Recent Cardiac Studies: Carotid US 09/10/14 1. Mild bilateral carotid atherosclerotic vascular disease. No flow limiting stenosis. Degree of stenosis less than 50%. 2.  Vertebral arteries are patent with antegrade flow. 3. Incidental note is made of multinodular goiter. Similar findings noted on prior neck CT of 06/30/ 2014.  Echo 09/19/2012:  Ejection fraction 0000000, diastolic dysfunction, mild LVH, moderate aortic valve stenosis, normal RV function and size.  Echocardiogram from 09/10/2014 is pending.      Surgical History:  Past Surgical History  Procedure Laterality Date  . Tubal ligation       Home Meds: Prior to Admission medications   Medication Sig Start Date End Date  Taking? Authorizing Provider  aspirin 81 MG tablet Take 81 mg by mouth daily.    Historical Provider, MD  brimonidine (ALPHAGAN P) 0.1 % SOLN Place 1 drop into the right eye 2 (two) times daily.    Historical Provider, MD  brimonidine (ALPHAGAN) 0.15 % ophthalmic solution Place 1 drop into the right eye 2 (two) times daily.    Historical Provider, MD  cholecalciferol (VITAMIN D) 1000 UNITS tablet Take 2,000 Units by mouth daily.     Historical Provider, MD  ciprofloxacin (CIPRO) 250 MG tablet Take 1 tablet (250 mg total) by mouth 2 (two) times daily. 08/29/14   Hillary Bow, MD  colchicine 0.6 MG tablet Take 0.6 mg by mouth 3 (three) times daily as needed.     Historical Provider, MD  Dorzolamide HCl-Timolol Mal (COSOPT OP) Apply to eye 2 (two) times daily.    Historical Provider, MD  gabapentin (NEURONTIN) 300 MG capsule Take 300 mg by mouth 2 (two) times daily.    Historical Provider, MD  glimepiride (AMARYL) 1 MG tablet Take 1 mg by mouth daily with breakfast.    Historical Provider, MD  iron polysaccharides (NIFEREX) 150 MG capsule Take 150 mg by mouth 2 (two) times daily.    Historical Provider, MD  latanoprost (XALATAN) 0.005 % ophthalmic solution Place 1 drop into the right eye at bedtime. 07/03/14   Historical Provider, MD  lisinopril (PRINIVIL,ZESTRIL) 40 MG tablet TAKE 1 TABLET BY MOUTH ONCE DAILY (8AM) 12/25/13   Minna Merritts, MD  lovastatin (MEVACOR) 40 MG tablet Take 40 mg by mouth at bedtime.    Historical Provider, MD  Magnesium 250 MG TABS Take 2 tablets by mouth daily.    Historical Provider, MD  metoprolol succinate (TOPROL-XL) 50 MG 24 hr tablet Take 50 mg by mouth daily. Take with or immediately following a meal.    Historical Provider, MD  pantoprazole (PROTONIX) 40 MG tablet Take 40 mg by mouth 2 (two) times daily.    Historical Provider, MD  pilocarpine (PILOCAR) 4 % ophthalmic solution Place 1 drop into the right eye 4 (four) times daily.     Historical Provider, MD    tiZANidine (ZANAFLEX) 4 MG tablet Take 1 tablet by mouth 3 (three) times daily as needed. 08/08/14   Historical Provider, MD    Inpatient Medications:  . aspirin EC  81 mg Oral Daily  . atorvastatin  40 mg Oral q1800  . brimonidine  1 drop Right Eye BID  . cholecalciferol  2,000 Units Oral Daily  . dorzolamide-timolol  1 drop Both Eyes BID  . feeding supplement (ENSURE ENLIVE)  237 mL Oral BID BM  . gabapentin  300 mg Oral BID  . heparin  5,000 Units Subcutaneous 3 times per day  . insulin aspart  0-5 Units Subcutaneous QHS  . insulin aspart  0-9 Units Subcutaneous TID WC  . latanoprost  1 drop Right Eye QHS  . lisinopril  20 mg Oral  Daily  . metoprolol succinate  50 mg Oral Daily  . pantoprazole  40 mg Oral BID  . pilocarpine  1 drop Right Eye QID      Allergies:  Allergies  Allergen Reactions  . Metformin And Related     History   Social History  . Marital Status: Widowed    Spouse Name: N/A  . Number of Children: N/A  . Years of Education: N/A   Occupational History  . Not on file.   Social History Main Topics  . Smoking status: Never Smoker   . Smokeless tobacco: Not on file  . Alcohol Use: No  . Drug Use: No  . Sexual Activity: Not on file   Other Topics Concern  . Not on file   Social History Narrative     Family History  Problem Relation Age of Onset  . Glaucoma Mother   . Diabetes Mother   . Peptic Ulcer Father   . Colitis Father      Review of Systems: Review of Systems  Constitutional: Positive for weight loss. Negative for fever, chills, malaise/fatigue and diaphoresis.  HENT: Negative for congestion.   Eyes: Negative for blurred vision, discharge and redness.  Respiratory: Negative for cough, hemoptysis, sputum production, shortness of breath and wheezing.   Cardiovascular: Positive for palpitations. Negative for chest pain, orthopnea, claudication, leg swelling and PND.  Gastrointestinal: Negative for heartburn, nausea, vomiting,  abdominal pain, diarrhea, constipation, blood in stool and melena.  Genitourinary: Negative for hematuria.  Musculoskeletal: Negative for myalgias and falls.  Skin: Positive for itching and rash.  Neurological: Negative for dizziness, tingling, sensory change, speech change, focal weakness, loss of consciousness, weakness and headaches.  Endo/Heme/Allergies: Does not bruise/bleed easily.  Psychiatric/Behavioral: Negative for depression. The patient is not nervous/anxious.   All other systems reviewed and are negative.    Labs:  Recent Labs  09/10/14 1345 09/10/14 1931 09/11/14 0246  TROPONINI 0.06* 0.25* 0.18*   Lab Results  Component Value Date   WBC 12.0* 09/11/2014   HGB 11.4* 09/11/2014   HCT 34.3* 09/11/2014   MCV 90.7 09/11/2014   PLT 201 09/11/2014     Recent Labs Lab 09/10/14 1345 09/11/14 0246  NA 138 139  K 3.6 3.7  CL 105 108  CO2 24 26  BUN 21* 20  CREATININE 0.89 0.81  CALCIUM 9.1 8.8*  PROT 6.7  --   BILITOT 0.9  --   ALKPHOS 55  --   ALT 23  --   AST 31  --   GLUCOSE 156* 114*   Lab Results  Component Value Date   CHOL 114 09/11/2014   HDL 40* 09/11/2014   LDLCALC 55 09/11/2014   TRIG 97 09/11/2014   No results found for: DDIMER  Radiology/Studies:  Ct Head Wo Contrast  08/28/2014   CLINICAL DATA:  Diarrhea and confusion for several days. History of diabetes, hypertension.  EXAM: CT HEAD WITHOUT CONTRAST  TECHNIQUE: Contiguous axial images were obtained from the base of the skull through the vertex without intravenous contrast.  COMPARISON:  CT head September 21, 2012  FINDINGS: The ventricles and sulci are normal for age. No intraparenchymal hemorrhage, mass effect nor midline shift. Patchy supratentorial white matter hypodensities are less than expected for patient's age and though non-specific suggest sequelae of chronic small vessel ischemic disease. No acute large vascular territory infarcts. New 8 x 5 mm low-density RIGHT mesial thalamus  lacunar infarct does not appear acute. Old RIGHT superior cerebellar small  infarcts versus prominent folia, similar.  No abnormal extra-axial fluid collections. Basal cisterns are patent. Moderate calcific atherosclerosis of the carotid siphons.  No skull fracture. The frontal scalp scarring. The included ocular globes and orbital contents are non-suspicious. Bilateral ocular lens implants. The mastoid aircells and included paranasal sinuses are well-aerated.  IMPRESSION: No acute intracranial process.  New RIGHT mesial thalamus lacunar infarct does not appear acute.  Involutional changes. Mild white matter changes compatible chronic small vessel ischemic disease.   Electronically Signed   By: Elon Alas M.D.   On: 08/28/2014 00:53   Ct Abdomen Pelvis W Contrast  08/28/2014   CLINICAL DATA:  Diarrhea and altered mental status for several days.  EXAM: CT ABDOMEN AND PELVIS WITH CONTRAST  TECHNIQUE: Multidetector CT imaging of the abdomen and pelvis was performed using the standard protocol following bolus administration of intravenous contrast.  CONTRAST:  62mL OMNIPAQUE IOHEXOL 300 MG/ML  SOLN  COMPARISON:  None.  FINDINGS: There is severe prolapse of the bladder and rectum 10-15 cm below the pubic symphysis. The ureterovesical junctions are within the prolapsed portion of the bladder and there is resulting hydroureter and moderate hydronephrosis bilaterally.  There is extensive colonic diverticulosis. There is no evidence of diverticulitis or other acute inflammatory process in the abdomen or pelvis. There are unremarkable appearances of the liver with the exception of a 13 mm cyst in the medial right hepatic lobe and an adjacent 7 mm hypodensity which is too small for definitive characterization but likely an additional cyst. The gallbladder is contracted. The bile ducts are normal. The pancreas, spleen and adrenals are normal in appearance.  There is a hiatal hernia. Stomach and small bowel are  otherwise normal in appearance.  The abdominal aorta is normal in caliber. There is moderate atherosclerotic calcification. There is no adenopathy in the abdomen or pelvis.  Mild curvilinear scarring is present in the left lung base. No acute findings are evident in the lower chest. No significant musculoskeletal lesions are evident. There is moderately severe degenerative disc change throughout the lumbar spine.  IMPRESSION: 1. Severe prolapse of the bladder and rectum well below the pubic symphysis. 2. Bilateral hydroureter and hydronephrosis due to mechanical compromise of the ureterovesical junctions within the prolapse 3. Diverticulosis 4. Hiatal hernia   Electronically Signed   By: Andreas Newport M.D.   On: 08/28/2014 04:28   US Carotid Bilateral  09/11/2014   CLINICAL DATA:  Stroke.  EXAM: BILATERAL CAROTID DUPLEX ULTRASOUND  TECHNIQUE: Pearline Cables scale imaging, color Doppler and duplex ultrasound were performed of bilateral carotid and vertebral arteries in the neck.  COMPARISON:  Neck CT of 09/24/2012.  FINDINGS: Criteria: Quantification of carotid stenosis is based on velocity parameters that correlate the residual internal carotid diameter with NASCET-based stenosis levels, using the diameter of the distal internal carotid lumen as the denominator for stenosis measurement.  The following velocity measurements were obtained:  RIGHT  ICA:  61/10 cm/sec  CCA:  [id number] cm/sec  SYSTOLIC ICA/CCA RATIO:  1.3  DIASTOLIC ICA/CCA RATIO:  1.2  ECA:  73 cm/sec  LEFT  ICA:  57/18 cm/sec  CCA:  0000000 cm/sec  SYSTOLIC ICA/CCA RATIO:  1.1  DIASTOLIC ICA/CCA RATIO:  1.7  ECA:  86 cm/sec  RIGHT CAROTID ARTERY: Mild right carotid bifurcation atherosclerotic vascular plaque. No flow limiting stenosis. Waveforms unremarkable.  RIGHT VERTEBRAL ARTERY:  Patent with antegrade flow.  LEFT CAROTID ARTERY: Mild left carotid atherosclerotic vascular plaque. No flow limiting stenosis. Waveforms unremarkable.  LEFT VERTEBRAL ARTERY:   Patent with antegrade flow.  Incidental note is made of a heterogeneous enlarged thyroid gland. This is most consistent multinodular goiter and is stable from prior neck CT of 09/24/2012.  IMPRESSION: 1. Mild bilateral carotid atherosclerotic vascular disease. No flow limiting stenosis. Degree of stenosis less than 50%.  2.  Vertebral arteries are patent with antegrade flow.  3. Incidental note is made of multinodular goiter. Similar findings noted on prior neck CT of 06/30/ 2014.   Electronically Signed   By: Marcello Moores  Register   On: 09/11/2014 10:54   Dg Chest Portable 1 View  08/28/2014   CLINICAL DATA:  Altered mental status  EXAM: PORTABLE CHEST - 1 VIEW  COMPARISON:  07/19/2014  FINDINGS: Cardiac shadow is stable. The lungs are well aerated bilaterally. No acute bony abnormality is seen. Aortic calcifications are again noted.  IMPRESSION: Aortic atherosclerotic change.  No acute abnormality noted.   Electronically Signed   By: Inez Catalina M.D.   On: 08/28/2014 00:47    EKG: SVT, 133 bpm, left axis deviation, nonspecific inferolateral st/t changes   Weights: Filed Weights   09/10/14 1341 09/10/14 1631 09/11/14 0338  Weight: 155 lb (70.308 kg) 148 lb 1.6 oz (67.178 kg) 154 lb 9.6 oz (70.126 kg)     Physical Exam: Blood pressure 138/70, pulse 66, temperature 97.9 F (36.6 C), temperature source Oral, resp. rate 22, height 5\' 4"  (1.626 m), weight 154 lb 9.6 oz (70.126 kg), SpO2 99 %. Body mass index is 26.52 kg/(m^2). General: Sitting up in bed in no acute distress. Head: Normocephalic, atraumatic, sclera non-icteric, no xanthomas, nares are without discharge. Missing teeth.  Moist mucus membranes.  Neck: Negative for carotid bruits. JVD not elevated. Lungs: Clear bilaterally to auscultation without wheezes, rales, or rhonchi. Breathing is unlabored. Heart: RRR with S1 S2. II/VI systolic murmur RUSB. No rubs or gallops. Abdomen: Soft, non-tender, non-distended with normoactive bowel sounds.  No hepatomegaly. No rebound/guarding. No obvious abdominal masses. Msk:  Strength and tone appear normal for age. Extremities: No clubbing or cyanosis. No edema.  Distal pedal pulses are 2+ and equal bilaterally. Neuro: Alert and oriented X 3. No facial asymmetry. No focal deficit. Moves all extremities spontaneously. Psych:  Responds to questions appropriately with a normal affect.    Assessment and Plan:  81 y.o. AA female with a hx of paroxysmal SVT (found on 09/20/12, started on metoprolol and Cardizem), HTN, chronic diastolic CHF, and DM2 who presented via ambulance to the ED on 09/10/14 with complaints of dizziness, sweating, palpitations, and a cramping sensation across her chest that lasted an hour or more that began after she had been exerting herself doing housework, found to be in SVT with HR in the 130's and mildly elevated troponin. She has spontaneously converted to NSR.    1. Paroxysmal SVT: -Spontaneously converted to NSR upon arrival to [facility] -Has remained in NSR with heart rate in the 70's -Current episode of SVT possibly 2/2 prednisone taper for skin rash, may want to consider other options for rash in the future -Continue Toprol XL 50 mg daily  -No indication for long term anticoagulation in SVT  2. Elevated troponin:  -Mildly elevated likely in the setting of supply demand ischemic 2/2 SVT  - Mildly elevated and downward trending as above  -2-D echocardiogram pending to evaluate LV function and wall motion   -Continue metoprolol, lovastatin, lisinopril, and aspirin 81 mg daily  -History of remote cardiac cath many years ago that  was normal per patient   3. Hypertension:  - BP currently well controlled. No changes to home medications  3. Diabetes mellitus type 2:  -Hemoglobin A1c 7.0 -Currently on sliding scale per IM  4. Weakness and general decline:  -Patient reports she is actually quite active -CT scan from last admission shows a small thalamic infarct, not  acute -Physical therapy consultation ordered 7/13 -2-D echocardiogram pending; carotid US from 09/11/14 shows mild bilateral carotid atherosclerotic vascular disease. No flow limiting stenosis. Degree of stenosis less than 50%. Vertebral arteries are patent with antegrade flow -At this time no indication for long term full dose anticoagulation given objective rhythm  -Could consider TEE if indicated    Signed, Christell Faith, PA-C Pager: [phone] 09/11/2014, 4:22 PM

## 2014-09-12 LAB — GLUCOSE, CAPILLARY
Glucose-Capillary: 103 mg/dL — ABNORMAL HIGH (ref 65–99)
Glucose-Capillary: 217 mg/dL — ABNORMAL HIGH (ref 65–99)

## 2014-09-12 MED ORDER — COLCHICINE 0.6 MG PO TABS: 0.6000 mg | ORAL_TABLET | Freq: Every day | ORAL | Status: DC

## 2014-09-12 MED ORDER — DIPHENHYDRAMINE HCL 25 MG PO CAPS: 25.0000 mg | ORAL_CAPSULE | Freq: Three times a day (TID) | ORAL | Status: DC | PRN

## 2014-09-12 MED ORDER — HYDROCORTISONE 1 % EX CREA
TOPICAL_CREAM | Freq: Four times a day (QID) | CUTANEOUS | Status: DC | PRN
Start: 2014-09-12 — End: 2014-09-12
  Administered 2014-09-12: 1 via TOPICAL
  Filled 2014-09-12: qty 28

## 2014-09-12 MED ORDER — METOPROLOL SUCCINATE ER 25 MG PO TB24: 75.0000 mg | ORAL_TABLET | Freq: Every day | ORAL | Status: DC

## 2014-09-12 NOTE — Progress Notes (Signed)
Patient is discharge home in a stable condition, summary and f/u care given to both pt and daughter, verbalizes understanding , left with daughter

## 2014-09-12 NOTE — Care Management (Signed)
Patient is for discharge home today and patient along with her daughter are in agreement with home health nursing and physical therapy.  Attending to enter the order and face to face.  No agency preference.  Referral called to [place] with [facility]  Instructed that agency will contact to schedule initial visit.

## 2014-09-12 NOTE — Discharge Instructions (Signed)
[facility] WILL PROVIDE NURSING AND PHYSICAL THERAPY-  AGENCY WILL CONTACT YOU TO SCHEDULE AN INITIAL VISIT  [id number]

## 2014-09-12 NOTE — Discharge Summary (Signed)
[place] [facility] [place] NAME: [name]    MR#:  [id number]  DATE OF BIRTH:  [birth year]  DATE OF ADMISSION:  09/10/2014 ADMITTING PHYSICIAN: Aldean Jewett, MD  DATE OF DISCHARGE: 09/12/2014  PRIMARY CARE PHYSICIAN: [name], MD    ADMISSION DIAGNOSIS:  SVT (supraventricular tachycardia) [I47.1] Stroke [I63.9] Elevated troponin [R79.89]  DISCHARGE DIAGNOSIS:  Principal Problem:   NSTEMI (non-ST elevated myocardial infarction) ruled out.   Elevated troponin and angina due to SVT. Active Problems:   SVT (supraventricular tachycardia)   Diabetes mellitus, type 2   Essential hypertension, malignant   Chronic diastolic CHF (congestive heart failure)   SECONDARY DIAGNOSIS:   Past Medical History  Diagnosis Date  . Hypertension   . Hyperlipidemia   . Diabetes mellitus without complication   . Diastolic dysfunction     a. echo 2014: EF 55-60%, DD, mild LVH, moderate AS  . GERD (gastroesophageal reflux disease)   . Glaucoma   . Depression   . Gout   . Hiatal hernia   . Bone disease   . Anemia   . Aortic stenosis   . Cervical spine arthritis   . Vitamin deficiency   . Cognitive impairment   . Renal insufficiency   . Stroke   . Paroxysmal SVT (supraventricular tachycardia)     HOSPITAL COURSE:   #1 Elevated Troponin :  Mostly it is due to SVT episode and tachycardia. It pleatued. Will not give anticoagulation for now. continue her home medications including metoprolol, lovastatin, lisinopril, aspirin 81 mg daily. SHe has been taking a prednisone taper due to a skin rash, this may precipitated her tachycardia.  Called cardiology consult, as had SVT, and elevated troponin. reviewed Echo. Checked TSH.  Cardio increased dose of metoprolol.  #2 SVT: Patient has a history of paroxysmal SVT. She has converted spontaneously to normal sinus rhythm. Continue to monitor on telemetry. She has multi nodules  on thyroid, checked and normal TSH.  #3 hypertension: Blood pressure currently well controlled. No changes to home medications  #4 diabetes mellitus type 2: Check hemoglobin A1c and start sliding scale.   #5 weakness and general decline: Her CT scan from last admission shows a small thalamic infarct. The patient and her family state that they were not aware of this finding, but they do think she has had overall decline over the past 79 months.  get a physical therapy consultation while she is here to assess for any additional needs. Check lipids and hemoglobin A1c- 7.0 . 2-D echocardiogram pending,  got carotid Dopplers- b/l < 50% stenosis.   #6 skin rash: Viral versus an atopic dermatitis. Continue with topical treatment. The rash does not seem to be bothering her at this time. No skin infection. No fevers. Hold off on further systemic steroid.   DISCHARGE CONDITIONS:   stable  CONSULTS OBTAINED:  Treatment Team:  Minna Merritts, MD  DRUG ALLERGIES:   Allergies  Allergen Reactions  . Metformin And Related     DISCHARGE MEDICATIONS:   Current Discharge Medication List    START taking these medications   Details  diphenhydrAMINE (BENADRYL) 25 mg capsule Take 1 capsule (25 mg total) by mouth every 8 (eight) hours as needed for itching. Qty: 15 capsule, Refills: 0      CONTINUE these medications which have CHANGED   Details  colchicine 0.6 MG tablet Take 1 tablet (0.6 mg total) by mouth daily. Qty: 20 tablet, Refills: 0  metoprolol succinate (TOPROL-XL) 25 MG 24 hr tablet Take 3 tablets (75 mg total) by mouth daily. Take with or immediately following a meal. Qty: 120 tablet, Refills: 0      CONTINUE these medications which have NOT CHANGED   Details  aspirin 81 MG tablet Take 81 mg by mouth daily.    brimonidine (ALPHAGAN P) 0.1 % SOLN Place 1 drop into the right eye 2 (two) times daily.    cholecalciferol (VITAMIN D) 1000 UNITS tablet Take 2,000 Units by mouth  daily.     Dorzolamide HCl-Timolol Mal (COSOPT OP) Apply to eye 2 (two) times daily.    gabapentin (NEURONTIN) 300 MG capsule Take 300 mg by mouth 2 (two) times daily.    glimepiride (AMARYL) 1 MG tablet Take 1 mg by mouth daily with breakfast.    iron polysaccharides (NIFEREX) 150 MG capsule Take 150 mg by mouth 2 (two) times daily.    latanoprost (XALATAN) 0.005 % ophthalmic solution Place 1 drop into the right eye at bedtime. Refills: 3    lisinopril (PRINIVIL,ZESTRIL) 40 MG tablet TAKE 1 TABLET BY MOUTH ONCE DAILY (8AM) Qty: 90 tablet, Refills: 3    lovastatin (MEVACOR) 40 MG tablet Take 40 mg by mouth at bedtime.    Magnesium 250 MG TABS Take 2 tablets by mouth daily.    pantoprazole (PROTONIX) 40 MG tablet Take 40 mg by mouth 2 (two) times daily.    pilocarpine (PILOCAR) 4 % ophthalmic solution Place 1 drop into the right eye 4 (four) times daily.     tiZANidine (ZANAFLEX) 4 MG tablet Take 1 tablet by mouth 3 (three) times daily as needed. Refills: 1      STOP taking these medications     brimonidine (ALPHAGAN) 0.15 % ophthalmic solution      ciprofloxacin (CIPRO) 250 MG tablet          DISCHARGE INSTRUCTIONS:    Follow with cardiology in 1-2 weeks.  If you experience worsening of your admission symptoms, develop shortness of breath, life threatening emergency, suicidal or homicidal thoughts you must seek medical attention immediately by calling 911 or calling your MD immediately  if symptoms less severe.  You Must read complete instructions/literature along with all the possible adverse reactions/side effects for all the Medicines you take and that have been prescribed to you. Take any new Medicines after you have completely understood and accept all the possible adverse reactions/side effects.   Please note  You were cared for by a hospitalist during your hospital stay. If you have any questions about your discharge medications or the care you received while  you were in the hospital after you are discharged, you can call the unit and asked to speak with the hospitalist on call if the hospitalist that took care of you is not available. Once you are discharged, your primary care physician will handle any further medical issues. Please note that NO REFILLS for any discharge medications will be authorized once you are discharged, as it is imperative that you return to your primary care physician (or establish a relationship with a primary care physician if you do not have one) for your aftercare needs so that they can reassess your need for medications and monitor your lab values.    Today   CHIEF COMPLAINT:   Chief Complaint  Patient presents with  . Tachycardia    HISTORY OF PRESENT ILLNESS:  Rondalyn Bero  is a 81 y.o. female with a known history of retention,  diabetes and episodic SVT who presents with dizziness and left-sided chest cramping. Her family reports that she has had a gradual decline in energy level and overall health over the past 2 months. She has had emergency room visits in May for weakness, was admitted in June for hypotension and diarrhea. For the past few days she has been doing well. This morning while cleaning her house at around 10 AM she felt acutely dizzy with cramping in the center of her chest. The cramping did not radiate. She sat down for a while but did not feel any better. She denies palpitations nausea or presyncope. Her youngest sister came to check on her and found her very sweaty and weak appearing and called EMS. On arrival she is in SVT with a rate of 133 and her troponin is slightly elevated at 0.06.   VITAL SIGNS:  Blood pressure 181/88, pulse 75, temperature 98.7 F (37.1 C), temperature source Oral, resp. rate 20, height 5\' 4"  (1.626 m), weight 70.126 kg (154 lb 9.6 oz), SpO2 100 %.  I/O:   Intake/Output Summary (Last 24 hours) at 09/12/14 1145 Last data filed at 09/12/14 0830  Gross per 24 hour  Intake     120 ml  Output    250 ml  Net   -130 ml    PHYSICAL EXAMINATION:  GENERAL: 81 y.o.-year-old patient lying in the bed with no acute distress.  EYES: Pupils equal, round, reactive to light and accommodation. No scleral icterus. Extraocular muscles intact.  HEENT: Head atraumatic, normocephalic. Oropharynx and nasopharynx clear. adentulous NECK: Supple, no jugular venous distention. No thyroid enlargement, no tenderness.  LUNGS: Normal breath sounds bilaterally, no wheezing, rales,rhonchi or crepitation. No use of accessory muscles of respiration.  CARDIOVASCULAR: S1, S2 normal. No murmurs, rubs, or gallops.  ABDOMEN: Soft, nontender, nondistended. Bowel sounds present. No organomegaly or mass.  EXTREMITIES: No pedal edema, cyanosis, or clubbing.  NEUROLOGIC: Cranial nerves II through XII are intact. Muscle strength 5/5 in all extremities. Sensation intact. Gait not checked.  PSYCHIATRIC: The patient is alert and oriented x 3.  SKIN: No obvious rash, lesion, or ulcer.   DATA REVIEW:   CBC  Recent Labs Lab 09/11/14 0246  WBC 12.0*  HGB 11.4*  HCT 34.3*  PLT 201    Chemistries   Recent Labs Lab 09/10/14 1345 09/11/14 0246  NA 138 139  K 3.6 3.7  CL 105 108  CO2 24 26  GLUCOSE 156* 114*  BUN 21* 20  CREATININE 0.89 0.81  CALCIUM 9.1 8.8*  AST 31  --   ALT 23  --   ALKPHOS 55  --   BILITOT 0.9  --     Cardiac Enzymes  Recent Labs Lab 09/11/14 0246  TROPONINI 0.18*    Microbiology Results  Results for orders placed or performed during the hospital encounter of 08/27/14  Culture, blood (routine x 2)     Status: None   Collection Time: 08/27/14 11:52 PM  Result Value Ref Range Status   Specimen Description BLOOD  Final   Special Requests Normal  Final   Culture NO GROWTH 5 DAYS  Final   Report Status 09/02/2014 FINAL  Final  Culture, blood (routine x 2)     Status: None   Collection Time: 08/28/14 12:33 AM  Result Value Ref Range Status    Specimen Description BLOOD  Final   Special Requests Normal  Final   Culture NO GROWTH 5 DAYS  Final   Report Status  09/02/2014 FINAL  Final  Stool culture     Status: None   Collection Time: 08/28/14 10:12 PM  Result Value Ref Range Status   Specimen Description STOOL  Final   Special Requests NONE  Final   Culture   Final    NO SALMONELLA OR SHIGELLA ISOLATED No Pathogenic E. coli detected NO CAMPYLOBACTER DETECTED    Report Status 09/01/2014 FINAL  Final  C difficile quick scan w PCR reflex ([facility] only)     Status: None   Collection Time: 08/28/14 10:12 PM  Result Value Ref Range Status   C Diff antigen NEGATIVE  Final   C Diff toxin NEGATIVE  Final   C Diff interpretation Negative for C. difficile  Final    RADIOLOGY:  US Carotid Bilateral  09/11/2014   CLINICAL DATA:  Stroke.  EXAM: BILATERAL CAROTID DUPLEX ULTRASOUND  TECHNIQUE: Pearline Cables scale imaging, color Doppler and duplex ultrasound were performed of bilateral carotid and vertebral arteries in the neck.  COMPARISON:  Neck CT of 09/24/2012.  FINDINGS: Criteria: Quantification of carotid stenosis is based on velocity parameters that correlate the residual internal carotid diameter with NASCET-based stenosis levels, using the diameter of the distal internal carotid lumen as the denominator for stenosis measurement.  The following velocity measurements were obtained:  RIGHT  ICA:  61/10 cm/sec  CCA:  [id number] cm/sec  SYSTOLIC ICA/CCA RATIO:  1.3  DIASTOLIC ICA/CCA RATIO:  1.2  ECA:  73 cm/sec  LEFT  ICA:  57/18 cm/sec  CCA:  0000000 cm/sec  SYSTOLIC ICA/CCA RATIO:  1.1  DIASTOLIC ICA/CCA RATIO:  1.7  ECA:  86 cm/sec  RIGHT CAROTID ARTERY: Mild right carotid bifurcation atherosclerotic vascular plaque. No flow limiting stenosis. Waveforms unremarkable.  RIGHT VERTEBRAL ARTERY:  Patent with antegrade flow.  LEFT CAROTID ARTERY: Mild left carotid atherosclerotic vascular plaque. No flow limiting stenosis. Waveforms unremarkable.  LEFT VERTEBRAL  ARTERY:  Patent with antegrade flow.  Incidental note is made of a heterogeneous enlarged thyroid gland. This is most consistent multinodular goiter and is stable from prior neck CT of 09/24/2012.  IMPRESSION: 1. Mild bilateral carotid atherosclerotic vascular disease. No flow limiting stenosis. Degree of stenosis less than 50%.  2.  Vertebral arteries are patent with antegrade flow.  3. Incidental note is made of multinodular goiter. Similar findings noted on prior neck CT of 06/30/ 2014.   Electronically Signed   By: Marcello Moores  Register   On: 09/11/2014 10:54    Management plans discussed with the patient, family and they are in agreement.  CODE STATUS:     Code Status Orders        Start     Ordered   09/10/14 1636  Full code   Continuous     09/10/14 1635    Advance Directive Documentation        Most Recent Value   Type of Advance Directive  Healthcare Power of [place], Living will   Pre-existing out of facility DNR order (yellow form or pink MOST form)     "MOST" Form in Place?        TOTAL TIME TAKING CARE OF THIS PATIENT: 35 minutes.    Vaughan Basta M.D on 09/12/2014 at 11:45 AM  Between 7am to 6pm - Pager - [phone]  After 6pm go to [email or web address] - password [email or web address]  Office  [phone]  CC: Primary care physician; [name], MD

## 2014-09-12 NOTE — Care Management Important Message (Signed)
Important Message  Patient Details  Name: [name] MRN: [id number] Date of Birth: 1924/11/22   Medicare Important Message Given:  Yes-second notification given    [name] 09/12/2014, 10:40 AM

## 2014-09-12 NOTE — Progress Notes (Signed)
Patient: [name] / Admit Date: 09/10/2014 / Date of Encounter: 09/12/2014, 11:11 AM   Subjective: Pt initially presented with on 09/10/14 with complaints of dizziness, sweating, palpitations, and a cramping sensation across her chest, found to be in SVT-->spontaneously converted to NSR upon admission. This AM (09/12/14) she has no complaints and feels back at baseline, ready for discharge home. Denies chest pain, SOB, dizziness, or sensation of palpitations.  Her daughter states that she seems at her baseline other than being a little tired from taking Benadryl for her rash.    Review of Systems: Review of Systems  Constitutional: Negative for fever, chills, malaise/fatigue and diaphoresis.  HENT: Negative for congestion.   Eyes: Negative for blurred vision, discharge and redness.  Respiratory: Negative for cough, sputum production, shortness of breath and wheezing.   Cardiovascular: Negative for chest pain, palpitations, orthopnea and leg swelling.  Gastrointestinal: Negative for nausea, vomiting and abdominal pain.  Musculoskeletal: Negative for myalgias, back pain and falls.  Skin: Positive for itching and rash.  Neurological: Negative for dizziness, speech change and weakness.  Psychiatric/Behavioral: Negative for memory loss. The patient is not nervous/anxious.   All other systems reviewed and are negative.   Objective: Telemetry: NSR, upper 70's Physical Exam: Blood pressure 141/82, pulse 79, temperature 98 F (36.7 C), temperature source Oral, resp. rate 18, height 5\' 4"  (1.626 m), weight 154 lb 9.6 oz (70.126 kg), SpO2 98 %. Body mass index is 26.52 kg/(m^2). General: Well developed, well nourished, in no acute distress.Sitting up in bed watching tv with daughter at bedside. Head: Normocephalic, atraumatic, sclera non-icteric, no xanthomas, nares are without discharge. Neck: Negative for carotid bruits. JVP not elevated. Lungs: Clear bilaterally to auscultation without  wheezes, rales, or rhonchi. Breathing is unlabored. Heart: RRR S1 S2 without murmurs, rubs, or gallops.  Abdomen: Soft, non-tender, non-distended with normoactive bowel sounds. No rebound/guarding. Extremities: No clubbing or cyanosis. No edema. Distal pedal pulses are 2+ and equal bilaterally. Neuro: Alert and oriented X 3. Moves all extremities spontaneously. Psych:  Responds to questions appropriately with a normal affect.   Intake/Output Summary (Last 24 hours) at 09/12/14 1111 Last data filed at 09/12/14 0830  Gross per 24 hour  Intake    360 ml  Output    250 ml  Net    110 ml    Inpatient Medications:  . aspirin EC  81 mg Oral Daily  . atorvastatin  40 mg Oral q1800  . brimonidine  1 drop Right Eye BID  . cholecalciferol  2,000 Units Oral Daily  . dorzolamide-timolol  1 drop Both Eyes BID  . feeding supplement (ENSURE ENLIVE)  237 mL Oral BID BM  . gabapentin  300 mg Oral BID  . heparin  5,000 Units Subcutaneous 3 times per day  . insulin aspart  0-5 Units Subcutaneous QHS  . insulin aspart  0-9 Units Subcutaneous TID WC  . latanoprost  1 drop Right Eye QHS  . lisinopril  20 mg Oral Daily  . metoprolol succinate  75 mg Oral Daily  . pantoprazole  40 mg Oral BID  . pilocarpine  1 drop Right Eye QID  . sodium chloride  3 mL Intravenous Q12H   Infusions:    Labs:  Recent Labs  09/10/14 1345 09/11/14 0246  NA 138 139  K 3.6 3.7  CL 105 108  CO2 24 26  GLUCOSE 156* 114*  BUN 21* 20  CREATININE 0.89 0.81  CALCIUM 9.1 8.8*  Recent Labs  09/10/14 1345  AST 31  ALT 23  ALKPHOS 55  BILITOT 0.9  PROT 6.7  ALBUMIN 3.9    Recent Labs  09/10/14 1345 09/11/14 0246  WBC 14.9* 12.0*  NEUTROABS 13.2*  --   HGB 12.1 11.4*  HCT 36.5 34.3*  MCV 90.7 90.7  PLT 235 201    Recent Labs  09/10/14 1345 09/10/14 1931 09/11/14 0246  TROPONINI 0.06* 0.25* 0.18*   Invalid input(s): POCBNP  Recent Labs  09/10/14 1345  HGBA1C 7.0*     Weights: Filed  Weights   09/10/14 1341 09/10/14 1631 09/11/14 0338  Weight: 155 lb (70.308 kg) 148 lb 1.6 oz (67.178 kg) 154 lb 9.6 oz (70.126 kg)     Radiology/Studies:  Ct Head Wo Contrast  08/28/2014   CLINICAL DATA:  Diarrhea and confusion for several days. History of diabetes, hypertension.  EXAM: CT HEAD WITHOUT CONTRAST  TECHNIQUE: Contiguous axial images were obtained from the base of the skull through the vertex without intravenous contrast.  COMPARISON:  CT head September 21, 2012  FINDINGS: The ventricles and sulci are normal for age. No intraparenchymal hemorrhage, mass effect nor midline shift. Patchy supratentorial white matter hypodensities are less than expected for patient's age and though non-specific suggest sequelae of chronic small vessel ischemic disease. No acute large vascular territory infarcts. New 8 x 5 mm low-density RIGHT mesial thalamus lacunar infarct does not appear acute. Old RIGHT superior cerebellar small infarcts versus prominent folia, similar.  No abnormal extra-axial fluid collections. Basal cisterns are patent. Moderate calcific atherosclerosis of the carotid siphons.  No skull fracture. The frontal scalp scarring. The included ocular globes and orbital contents are non-suspicious. Bilateral ocular lens implants. The mastoid aircells and included paranasal sinuses are well-aerated.  IMPRESSION: No acute intracranial process.  New RIGHT mesial thalamus lacunar infarct does not appear acute.  Involutional changes. Mild white matter changes compatible chronic small vessel ischemic disease.   Electronically Signed   By: Elon Alas M.D.   On: 08/28/2014 00:53   Ct Abdomen Pelvis W Contrast  08/28/2014   CLINICAL DATA:  Diarrhea and altered mental status for several days.  EXAM: CT ABDOMEN AND PELVIS WITH CONTRAST  TECHNIQUE: Multidetector CT imaging of the abdomen and pelvis was performed using the standard protocol following bolus administration of intravenous contrast.  CONTRAST:   31mL OMNIPAQUE IOHEXOL 300 MG/ML  SOLN  COMPARISON:  None.  FINDINGS: There is severe prolapse of the bladder and rectum 10-15 cm below the pubic symphysis. The ureterovesical junctions are within the prolapsed portion of the bladder and there is resulting hydroureter and moderate hydronephrosis bilaterally.  There is extensive colonic diverticulosis. There is no evidence of diverticulitis or other acute inflammatory process in the abdomen or pelvis. There are unremarkable appearances of the liver with the exception of a 13 mm cyst in the medial right hepatic lobe and an adjacent 7 mm hypodensity which is too small for definitive characterization but likely an additional cyst. The gallbladder is contracted. The bile ducts are normal. The pancreas, spleen and adrenals are normal in appearance.  There is a hiatal hernia. Stomach and small bowel are otherwise normal in appearance.  The abdominal aorta is normal in caliber. There is moderate atherosclerotic calcification. There is no adenopathy in the abdomen or pelvis.  Mild curvilinear scarring is present in the left lung base. No acute findings are evident in the lower chest. No significant musculoskeletal lesions are evident. There is moderately severe degenerative  disc change throughout the lumbar spine.  IMPRESSION: 1. Severe prolapse of the bladder and rectum well below the pubic symphysis. 2. Bilateral hydroureter and hydronephrosis due to mechanical compromise of the ureterovesical junctions within the prolapse 3. Diverticulosis 4. Hiatal hernia   Electronically Signed   By: Andreas Newport M.D.   On: 08/28/2014 04:28   US Carotid Bilateral  09/11/2014   CLINICAL DATA:  Stroke.  EXAM: BILATERAL CAROTID DUPLEX ULTRASOUND  TECHNIQUE: Pearline Cables scale imaging, color Doppler and duplex ultrasound were performed of bilateral carotid and vertebral arteries in the neck.  COMPARISON:  Neck CT of 09/24/2012.  FINDINGS: Criteria: Quantification of carotid stenosis is  based on velocity parameters that correlate the residual internal carotid diameter with NASCET-based stenosis levels, using the diameter of the distal internal carotid lumen as the denominator for stenosis measurement.  The following velocity measurements were obtained:  RIGHT  ICA:  61/10 cm/sec  CCA:  [id number] cm/sec  SYSTOLIC ICA/CCA RATIO:  1.3  DIASTOLIC ICA/CCA RATIO:  1.2  ECA:  73 cm/sec  LEFT  ICA:  57/18 cm/sec  CCA:  0000000 cm/sec  SYSTOLIC ICA/CCA RATIO:  1.1  DIASTOLIC ICA/CCA RATIO:  1.7  ECA:  86 cm/sec  RIGHT CAROTID ARTERY: Mild right carotid bifurcation atherosclerotic vascular plaque. No flow limiting stenosis. Waveforms unremarkable.  RIGHT VERTEBRAL ARTERY:  Patent with antegrade flow.  LEFT CAROTID ARTERY: Mild left carotid atherosclerotic vascular plaque. No flow limiting stenosis. Waveforms unremarkable.  LEFT VERTEBRAL ARTERY:  Patent with antegrade flow.  Incidental note is made of a heterogeneous enlarged thyroid gland. This is most consistent multinodular goiter and is stable from prior neck CT of 09/24/2012.  IMPRESSION: 1. Mild bilateral carotid atherosclerotic vascular disease. No flow limiting stenosis. Degree of stenosis less than 50%.  2.  Vertebral arteries are patent with antegrade flow.  3. Incidental note is made of multinodular goiter. Similar findings noted on prior neck CT of 06/30/ 2014.   Electronically Signed   By: Marcello Moores  Register   On: 09/11/2014 10:54   Dg Chest Portable 1 View  08/28/2014   CLINICAL DATA:  Altered mental status  EXAM: PORTABLE CHEST - 1 VIEW  COMPARISON:  07/19/2014  FINDINGS: Cardiac shadow is stable. The lungs are well aerated bilaterally. No acute bony abnormality is seen. Aortic calcifications are again noted.  IMPRESSION: Aortic atherosclerotic change.  No acute abnormality noted.   Electronically Signed   By: Inez Catalina M.D.   On: 08/28/2014 00:47     Assessment and Plan  81 y.o. AA female with a hx of paroxysmal SVT (found on 09/20/12,  started on metoprolol and Cardizem), HTN, chronic diastolic CHF, and DM2 who presented via ambulance to the ED on 09/10/14 with complaints of dizziness, sweating, palpitations, and a cramping sensation across her chest that lasted an hour or more that began after she had been exerting herself doing housework, found to be in SVT with HR in the 130's and mildly elevated troponin. She has spontaneously converted to NSR.   1. Paroxysmal SVT: -Spontaneously converted to NSR upon arrival to [facility] -Has remained in NSR with heart rate in the 70's -Current episode of SVT possibly 2/2 prednisone taper for skin rash, may want to consider other options for rash in the future (discussed with patient and daughters) -Change fromToprol XL 50 mg dailyto 75 mg daily, continue on discharge -No indication for long term anticoagulation in SVT -Follow up in office  2. Elevated troponin:  -Mildly elevated on 09/10/14  likely in the setting of supply demand ischemia 2/2 SVT  -Downward trended: 0.06-->0.25-->0.18 -2-D echocardiogram pending to evaluate LV function and wall motion  -Continue metoprolol, lovastatin, lisinopril, and aspirin 81 mg daily  -History of remote cardiac cath many years ago that was normal per patient   3. Hypertension:  -BP currently well controlled. No changes to home medications  3. Diabetes mellitus type 2:  -Hemoglobin A1c 7.0 -Currently on sliding scale per IM  4. Weakness and general decline:  -Patient reports she is actually quite active -CT scan from last admission shows a small thalamic infarct, not acute -Physical therapy consultation ordered, recommended HHPT (for home safety) when medically appropriate -2-D echocardiogram pending; carotid US from 09/11/14 shows mild bilateral carotid atherosclerotic vascular disease. No flow limiting stenosis. Degree of stenosis less than 50%. Vertebral arteries are patent with antegrade flow -At this time no indication for long term full  dose anticoagulation given objective rhythm  -Could consider TEE if indicated    SignedChristell Faith, PA-C Pager: [phone] 09/12/2014, 11:11 AM

## 2014-09-19 ENCOUNTER — Encounter: Payer: Self-pay | Admitting: Physician Assistant

## 2014-09-24 DIAGNOSIS — Z09 Encounter for follow-up examination after completed treatment for conditions other than malignant neoplasm: Secondary | ICD-10-CM | POA: Insufficient documentation

## 2014-09-26 ENCOUNTER — Encounter: Payer: Self-pay | Admitting: Physician Assistant

## 2014-10-10 ENCOUNTER — Ambulatory Visit (INDEPENDENT_AMBULATORY_CARE_PROVIDER_SITE_OTHER): Payer: Medicare HMO | Admitting: Cardiovascular Disease

## 2014-10-10 ENCOUNTER — Encounter: Payer: Self-pay | Admitting: Cardiovascular Disease

## 2014-10-10 VITALS — BP 124/70 | HR 67 | Ht 60.0 in | Wt 147.0 lb

## 2014-10-10 DIAGNOSIS — I10 Essential (primary) hypertension: Secondary | ICD-10-CM

## 2014-10-10 DIAGNOSIS — E119 Type 2 diabetes mellitus without complications: Secondary | ICD-10-CM | POA: Diagnosis not present

## 2014-10-10 DIAGNOSIS — I471 Supraventricular tachycardia: Secondary | ICD-10-CM

## 2014-10-10 DIAGNOSIS — I350 Nonrheumatic aortic (valve) stenosis: Secondary | ICD-10-CM

## 2014-10-10 NOTE — Progress Notes (Signed)
Patient ID: [name], female    DOB: 1924-10-28, 81 y.o.   MRN: [id number]  HPI Comments: [name] is a pleasant 81 year old woman with a history of diabetes, hypertension, SVT,    with recent hospital admission July 2016 for SVT. She presents for follow-up of her arrhythmia  She reports that she had a rash at the time. Turns out she went walking in her yard, developed insect bites all of her legs and abdomen (chiger bits). She was in a tremendous amount of discomfort with severe itching. Likely secondary to that, she developed arrhythmia. she converted back to normal sinus rhythm in the hospital. At the time of discharge her metoprolol was increased from 50 up to 75 mg daily. She is felt well with no further episodes of tachycardia. She currently lives at home, independent  EKG on today's visit shows normal sinus rhythm with rate 67 bpm, nonspecific T wave abnormality  Other past medical history  admitted to the hospital 09/20/2012 discharged on 09/25/2012 for Klebsiella UTI, shortness breath, tachycardia, SVT. Arrival to the hospital, she was very weak and was unable to swallow. She was felt to be very dehydrated. Her potassium was repleted, she was started on metoprolol and Cardizem.  Echocardiogram in the hospital 09/19/2012 showed ejection fraction 0000000, diastolic dysfunction, mild LVH, moderate aortic valve stenosis, normal RV function and size  CT scan of the neck showed enlargement of the thyroid with multinodular appearance, atherosclerotic disease, right pleural effusion Lab work in the hospital 09/11/2012 showing creatinine 1.57, BUN 44 Followup blood work showed creatinine 1.07, BUN 17 on July 23     Allergies  Allergen Reactions  . Metformin And Related     Current Outpatient Prescriptions on File Prior to Visit  Medication Sig Dispense Refill  . aspirin 81 MG tablet Take 81 mg by mouth daily.    . brimonidine (ALPHAGAN P) 0.1 % SOLN Place 1 drop into the  right eye 2 (two) times daily.    . cholecalciferol (VITAMIN D) 1000 UNITS tablet Take 2,000 Units by mouth daily.     . colchicine 0.6 MG tablet Take 1 tablet (0.6 mg total) by mouth daily. (Patient taking differently: Take 0.6 mg by mouth 3 (three) times daily as needed. ) 20 tablet 0  . diphenhydrAMINE (BENADRYL) 25 mg capsule Take 1 capsule (25 mg total) by mouth every 8 (eight) hours as needed for itching. 15 capsule 0  . Dorzolamide HCl-Timolol Mal (COSOPT OP) Apply to eye 2 (two) times daily.    Marland Kitchen gabapentin (NEURONTIN) 300 MG capsule Take 300 mg by mouth 2 (two) times daily.    Marland Kitchen glimepiride (AMARYL) 1 MG tablet Take 1 mg by mouth daily with breakfast.    . iron polysaccharides (NIFEREX) 150 MG capsule Take 150 mg by mouth 2 (two) times daily.    Marland Kitchen latanoprost (XALATAN) 0.005 % ophthalmic solution Place 1 drop into the right eye at bedtime.  3  . lisinopril (PRINIVIL,ZESTRIL) 40 MG tablet TAKE 1 TABLET BY MOUTH ONCE DAILY (8AM) 90 tablet 3  . lovastatin (MEVACOR) 40 MG tablet Take 40 mg by mouth at bedtime.    . metoprolol succinate (TOPROL-XL) 25 MG 24 hr tablet Take 3 tablets (75 mg total) by mouth daily. Take with or immediately following a meal. 120 tablet 0  . pantoprazole (PROTONIX) 40 MG tablet Take 40 mg by mouth 2 (two) times daily.    . pilocarpine (PILOCAR) 4 % ophthalmic solution Place 1 drop into  the right eye 4 (four) times daily.     Marland Kitchen tiZANidine (ZANAFLEX) 4 MG tablet Take 1 tablet by mouth 3 (three) times daily as needed.  1   No current facility-administered medications on file prior to visit.    Past Medical History  Diagnosis Date  . Hypertension   . Hyperlipidemia   . Diabetes mellitus without complication   . Diastolic dysfunction     a. echo 2014: EF 55-60%, DD, mild LVH, moderate AS  . GERD (gastroesophageal reflux disease)   . Glaucoma   . Depression   . Gout   . Hiatal hernia   . Bone disease   . Anemia   . Cervical spine arthritis   . Vitamin  deficiency   . Cognitive impairment   . History of renal impairment   . Stroke     a. CT head 07/2014 showed small thalamic infarct  . Paroxysmal SVT (supraventricular tachycardia)     a. on Toprol   . Aortic stenosis     a. echo 08/2014: EF 55-60%, no RWMA, GR1DD, severe AS, mild AI, Mean gradient (S): 38 mm Hg. Valve area (VTI): 0.78 cm^2., mild-mod MR, LA mildly dilated, atrial septal aneurysm, mild TR    Past Surgical History  Procedure Laterality Date  . Tubal ligation      Social History  reports that she has never smoked. She does not have any smokeless tobacco history on file. She reports that she does not drink alcohol or use illicit drugs.  Family History family history includes Colitis in her father; Diabetes in her mother; Glaucoma in her mother; Peptic Ulcer in her father.   Review of Systems  Cardiovascular: Negative.   Gastrointestinal: Negative.   Musculoskeletal: Positive for gait problem.  Neurological: Negative.   Hematological: Negative.   Psychiatric/Behavioral: Negative.   All other systems reviewed and are negative.   BP 124/70 mmHg  Pulse 67  Ht 5' (1.524 m)  Wt 147 lb (66.679 kg)  BMI 28.71 kg/m2  Physical Exam  Constitutional: She is oriented to person, place, and time. She appears well-developed and well-nourished.  HENT:  Head: Normocephalic.  Nose: Nose normal.  Mouth/Throat: Oropharynx is clear and moist.  Eyes: Conjunctivae are normal. Pupils are equal, round, and reactive to light.  Neck: Normal range of motion. Neck supple. No JVD present.  Cardiovascular: Normal rate, regular rhythm, S1 normal, S2 normal, normal heart sounds and intact distal pulses.  Exam reveals no gallop and no friction rub.   No murmur heard. Pulmonary/Chest: Effort normal and breath sounds normal. No respiratory distress. She has no wheezes. She has no rales. She exhibits no tenderness.  Abdominal: Soft. Bowel sounds are normal. She exhibits no distension. There  is no tenderness.  Musculoskeletal: Normal range of motion. She exhibits no edema or tenderness.  Lymphadenopathy:    She has no cervical adenopathy.  Neurological: She is alert and oriented to person, place, and time. Coordination normal.  Skin: Skin is warm and dry. No rash noted. No erythema.  Psychiatric: She has a normal mood and affect. Her behavior is normal. Judgment and thought content normal.    Assessment and Plan  Nursing note and vitals reviewed.

## 2014-10-10 NOTE — Patient Instructions (Signed)
You are doing well. No medication changes were made.  Please call us if you have new issues that need to be addressed before your next appt.  Your physician wants you to follow-up in: 6 months.  You will receive a reminder letter in the mail two months in advance. If you don't receive a letter, please call our office to schedule the follow-up appointment.   

## 2014-10-10 NOTE — Assessment & Plan Note (Signed)
Blood pressure is well controlled on today's visit. No changes made to the medications. 

## 2014-10-10 NOTE — Assessment & Plan Note (Signed)
We have encouraged continued exercise, careful diet management in an effort to lose weight. 

## 2014-10-10 NOTE — Assessment & Plan Note (Signed)
Recommended she stay on metoprolol succinate 75 mg daily Suggested she call for additional episodes of SVT. Under stressful conditions, she will develop SVT, most recently after stepping on insect nest with tremendous number of bites to her legs and discomfort

## 2014-10-10 NOTE — Assessment & Plan Note (Signed)
Moderate aortic valve stenosis with mean gradient 38, peak gradient 56, peak velocity 3 50 cm/s  she is asymptomatic at this time

## 2014-10-14 ENCOUNTER — Encounter: Payer: Self-pay | Admitting: Physician Assistant

## 2014-12-09 ENCOUNTER — Telehealth: Payer: Self-pay | Admitting: *Deleted

## 2014-12-09 NOTE — Telephone Encounter (Signed)
Pt daughter calling stating pt BP is running a bit high.  They are concerned about it  Pt c/o BP issue: STAT if pt c/o blurred vision, one-sided weakness or slurred speech  1. What are your last 5 BP readings?  12/08/14 176/104 morning 12/04/14 [employer] nurse came out and said it was high not sure of number 12/02/14  Pt went to dentist and they told her pt BP was high not sure of number   2. Are you having any other symptoms (ex. Dizziness, headache, blurred vision, passed out)?  No, but pt is also concerned.   3. What is your BP issue?  Its been running a bit high. Called PCP and they told her to call us. 3 different people brought to her attention that is why she is concerned.

## 2014-12-09 NOTE — Telephone Encounter (Signed)
Spoke w/ [place] that we will need 5 consecutive BP readings at home. She reports that pt does have monitor at home, she will call on Friday w/ readings.

## 2014-12-12 ENCOUNTER — Telehealth: Payer: Self-pay

## 2014-12-12 NOTE — Telephone Encounter (Signed)
Blain Pais at 12/12/2014 3:46 PM              Pt daughter called back with last 5 BP readings: 10/12 11:13 am -177/96, 11:14 am 170/91, 4:51 pm 186/103, 4:52 pm 168/87, 9:30 pm 90/58, 9:32 pm 93/61, 9:35 pm 95/57  10/13 10:56 am -154/87, 11:03 am 160/94, 11:07 am 162/90, 12:15 pm 159/95, 6:53 pm 159/89 6:54pm 155/8, 7:07 pm 149/78

## 2014-12-12 NOTE — Telephone Encounter (Signed)
Pt daughter called back with last 5 BP readings: 10/12 11:13 am -177/96, 11:14 am 170/91, 4:51 pm 186/103, 4:52 pm  168/87, 9:30 pm 90/58, 9:32 pm 93/61, 9:35 pm 95/57  10/13 10:56 am -154/87, 11:03 am 160/94, 11:07 am 162/90, 12:15 pm 159/95, 6:53 pm 159/89 6:54pm  155/8, 7:07 pm 149/78

## 2014-12-12 NOTE — Telephone Encounter (Signed)
Please see previous phone note.  

## 2014-12-14 NOTE — Telephone Encounter (Signed)
Would send in amlodipine 10 mg daily, Start 1/2 pill in the AM Monitor BPs for the next week. Needs Follow up appt.  Unclear by BPs dropped in the evening on the past set of numbers. Mistake?

## 2014-12-15 MED ORDER — AMLODIPINE BESYLATE 10 MG PO TABS: 10.0000 mg | ORAL_TABLET | Freq: Every day | ORAL | Status: DC

## 2014-12-15 NOTE — Telephone Encounter (Signed)
Spoke w/ [name].  Advised her of Dr. Donivan Scull recommendation.  She verbalizes understanding and will call next week w/ BP #s on 1/2 pill amlodipine.

## 2014-12-19 ENCOUNTER — Telehealth: Payer: Self-pay | Admitting: *Deleted

## 2014-12-19 NOTE — Telephone Encounter (Signed)
Pt daughter calling stating they already started new medication High in day and low (very low) at night This is her biggest concern.  Also treating pt for UTI not sure if this is just all this but pt is feeling sluggish.  Just wanted to let us know and keep [place] updated.  If we have any questions or advise please call.

## 2014-12-19 NOTE — Telephone Encounter (Signed)
S/w [name] who states on Tuesday, the day pt was to start amlodipine, daughter reports pt was sluggish. Did not start med, took pt to Urgent Care. Questionable UTI, started abx. Reports BP high in AM, low in PM. Reports 91/54 at 8pm AM BP readings: 161/90, 159/85, 169/93 Advised pt daughter to have pt start taking 5mg  amlodipine in the morning as directed by Dr. Rockey Situ. Continue to monitor BP and report readings to [place]. If tolerating dosage, can then increase to 10mg  as prescribed. Daughter verbalized understanding

## 2014-12-19 NOTE — Telephone Encounter (Signed)
Left message on machine for patient to contact the office.   

## 2014-12-22 ENCOUNTER — Encounter: Payer: Self-pay | Admitting: *Deleted

## 2014-12-22 ENCOUNTER — Emergency Department
Admission: EM | Admit: 2014-12-22 | Discharge: 2014-12-22 | Disposition: A | Discharge status: Home / Self Care | Payer: Medicare HMO | Attending: Emergency Medicine | Admitting: Emergency Medicine

## 2014-12-22 DIAGNOSIS — I952 Hypotension due to drugs: Secondary | ICD-10-CM | POA: Insufficient documentation

## 2014-12-22 DIAGNOSIS — E119 Type 2 diabetes mellitus without complications: Secondary | ICD-10-CM | POA: Diagnosis not present

## 2014-12-22 DIAGNOSIS — Z79899 Other long term (current) drug therapy: Secondary | ICD-10-CM | POA: Insufficient documentation

## 2014-12-22 DIAGNOSIS — Z7984 Long term (current) use of oral hypoglycemic drugs: Secondary | ICD-10-CM | POA: Insufficient documentation

## 2014-12-22 DIAGNOSIS — I10 Essential (primary) hypertension: Secondary | ICD-10-CM | POA: Diagnosis not present

## 2014-12-22 DIAGNOSIS — T461X5A Adverse effect of calcium-channel blockers, initial encounter: Secondary | ICD-10-CM | POA: Diagnosis not present

## 2014-12-22 DIAGNOSIS — Z7982 Long term (current) use of aspirin: Secondary | ICD-10-CM | POA: Insufficient documentation

## 2014-12-22 DIAGNOSIS — R5383 Other fatigue: Secondary | ICD-10-CM | POA: Diagnosis present

## 2014-12-22 LAB — TSH: TSH: 1.721 u[IU]/mL (ref 0.350–4.500)

## 2014-12-22 LAB — CBC WITH DIFFERENTIAL/PLATELET
Basophils Absolute: 0.1 10*3/uL (ref 0–0.1)
Basophils Relative: 1 %
EOS PCT: 5 %
Eosinophils Absolute: 0.3 10*3/uL (ref 0–0.7)
HCT: 36.5 % (ref 35.0–47.0)
Hemoglobin: 12.4 g/dL (ref 12.0–16.0)
LYMPHS ABS: 1.2 10*3/uL (ref 1.0–3.6)
LYMPHS PCT: 19 %
MCH: 31.7 pg (ref 26.0–34.0)
MCHC: 33.9 g/dL (ref 32.0–36.0)
MCV: 93.7 fL (ref 80.0–100.0)
Monocytes Absolute: 0.6 10*3/uL (ref 0.2–0.9)
Monocytes Relative: 9 %
NEUTROS PCT: 66 %
Neutro Abs: 4.1 10*3/uL (ref 1.4–6.5)
PLATELETS: 166 10*3/uL (ref 150–440)
RBC: 3.9 MIL/uL (ref 3.80–5.20)
RDW: 14 % (ref 11.5–14.5)
WBC: 6.2 10*3/uL (ref 3.6–11.0)

## 2014-12-22 LAB — COMPREHENSIVE METABOLIC PANEL
ALT: 19 U/L (ref 14–54)
AST: 28 U/L (ref 15–41)
Albumin: 3.8 g/dL (ref 3.5–5.0)
Alkaline Phosphatase: 53 U/L (ref 38–126)
Anion gap: 6 (ref 5–15)
BUN: 19 mg/dL (ref 6–20)
CALCIUM: 9.4 mg/dL (ref 8.9–10.3)
CHLORIDE: 106 mmol/L (ref 101–111)
CO2: 27 mmol/L (ref 22–32)
Creatinine, Ser: 1.12 mg/dL — ABNORMAL HIGH (ref 0.44–1.00)
GFR calc Af Amer: 49 mL/min — ABNORMAL LOW (ref >60)
GFR, EST NON AFRICAN AMERICAN: 43 mL/min — AB (ref >60)
Glucose, Bld: 160 mg/dL — ABNORMAL HIGH (ref 65–99)
Potassium: 4.2 mmol/L (ref 3.5–5.1)
SODIUM: 139 mmol/L (ref 135–145)
Total Bilirubin: 0.6 mg/dL (ref 0.3–1.2)
Total Protein: 6.4 g/dL — ABNORMAL LOW (ref 6.5–8.1)

## 2014-12-22 LAB — TROPONIN I: Troponin I: <0.03 ng/mL (ref <0.031)

## 2014-12-22 NOTE — ED Provider Notes (Signed)
Time Seen: Approximately 2028 I have reviewed the triage notes  Chief Complaint: Fatigue   History of Present Illness: [name] is a 80 y.o. female who was brought here by her daughter for evaluation of some episodes of hypotension at home. She's recently under treatment for a urinary tract infection was prescribed Cipro. She also has been prescribed amlodipine for blood pressure but her daughter withheld the medication due to these episodes of hypotension at home. Since had some generalized fatigue but no syncopal episodes and the patient denies any physical complaints such as headache, chest pain, fever. Continue to eat and drink normally and denies any loose stool or diarrhea. The patient apparently has drops in the blood pressure periodically during the day and these may all be medication related. She denies any other adjustments recently and her medication her blood sugars have been running normal at home.   Past Medical History  Diagnosis Date  . Hypertension   . Hyperlipidemia   . Diabetes mellitus without complication ([place])   . Diastolic dysfunction     a. echo 2014: EF 55-60%, DD, mild LVH, moderate AS  . GERD (gastroesophageal reflux disease)   . Glaucoma   . Depression   . Gout   . Hiatal hernia   . Bone disease   . Anemia   . Cervical spine arthritis ([place])   . Vitamin deficiency   . Cognitive impairment   . History of renal impairment   . Stroke [facility])     a. CT head 07/2014 showed small thalamic infarct  . Paroxysmal SVT (supraventricular tachycardia) ([facility])     a. on Toprol   . Aortic stenosis     a. echo 08/2014: EF 55-60%, no RWMA, GR1DD, severe AS, mild AI, Mean gradient (S): 38 mm Hg. Valve area (VTI): 0.78 cm^2., mild-mod MR, LA mildly dilated, atrial septal aneurysm, mild TR    Patient Active Problem List   Diagnosis Date Noted  . Aortic stenosis   . NSTEMI (non-ST elevated myocardial infarction) ([place]) 09/10/2014  . Renal failure 08/28/2014  . SVT  (supraventricular tachycardia) ([place]) 12/17/2012  . Diabetes mellitus, type 2 ([place]) 12/17/2012  . SOB (shortness of breath) 12/17/2012  . Essential hypertension, malignant 12/17/2012  . Chronic diastolic CHF (congestive heart failure) ([place]) 12/17/2012    Past Surgical History  Procedure Laterality Date  . Tubal ligation      Past Surgical History  Procedure Laterality Date  . Tubal ligation      Current Outpatient Rx  Name  Route  Sig  Dispense  Refill  . amLODipine (NORVASC) 10 MG tablet   Oral   Take 1 tablet (10 mg total) by mouth daily.   90 tablet   3   . aspirin 81 MG tablet   Oral   Take 81 mg by mouth daily.         . brimonidine (ALPHAGAN P) 0.1 % SOLN   Right Eye   Place 1 drop into the right eye 2 (two) times daily.         . cholecalciferol (VITAMIN D) 1000 UNITS tablet   Oral   Take 2,000 Units by mouth daily.          . colchicine 0.6 MG tablet   Oral   Take 1 tablet (0.6 mg total) by mouth daily. Patient taking differently: Take 0.6 mg by mouth 3 (three) times daily as needed.    20 tablet   0   . diphenhydrAMINE (  BENADRYL) 25 mg capsule   Oral   Take 1 capsule (25 mg total) by mouth every 8 (eight) hours as needed for itching.   15 capsule   0   . Dorzolamide HCl-Timolol Mal (COSOPT OP)   Ophthalmic   Apply to eye 2 (two) times daily.         Marland Kitchen gabapentin (NEURONTIN) 300 MG capsule   Oral   Take 300 mg by mouth 2 (two) times daily.         Marland Kitchen glimepiride (AMARYL) 1 MG tablet   Oral   Take 1 mg by mouth daily with breakfast.         . iron polysaccharides (NIFEREX) 150 MG capsule   Oral   Take 150 mg by mouth 2 (two) times daily.         Marland Kitchen latanoprost (XALATAN) 0.005 % ophthalmic solution   Right Eye   Place 1 drop into the right eye at bedtime.      3   . lisinopril (PRINIVIL,ZESTRIL) 40 MG tablet      TAKE 1 TABLET BY MOUTH ONCE DAILY (8AM)   90 tablet   3   . lovastatin (MEVACOR) 40 MG tablet   Oral   Take  40 mg by mouth at bedtime.         . metoprolol succinate (TOPROL-XL) 25 MG 24 hr tablet   Oral   Take 3 tablets (75 mg total) by mouth daily. Take with or immediately following a meal.   120 tablet   0   . pantoprazole (PROTONIX) 40 MG tablet   Oral   Take 40 mg by mouth 2 (two) times daily.         . pilocarpine (PILOCAR) 4 % ophthalmic solution   Right Eye   Place 1 drop into the right eye 4 (four) times daily.          Marland Kitchen tiZANidine (ZANAFLEX) 4 MG tablet   Oral   Take 1 tablet by mouth 3 (three) times daily as needed.      1     Allergies:  Metformin and related  Family History: Family History  Problem Relation Age of Onset  . Glaucoma Mother   . Diabetes Mother   . Peptic Ulcer Father   . Colitis Father     Social History: Social History  Substance Use Topics  . Smoking status: Never Smoker   . Smokeless tobacco: None  . Alcohol Use: No     Review of Systems:   10 point review of systems was performed and was otherwise negative:  Constitutional: No fever Eyes: No visual disturbances ENT: No sore throat, ear pain Cardiac: No chest pain Respiratory: No shortness of breath, wheezing, or stridor Abdomen: No abdominal pain, no vomiting, No diarrhea Endocrine: No weight loss, No night sweats Extremities: No peripheral edema, cyanosis Skin: No rashes, easy bruising Neurologic: No focal weakness, trouble with speech or swollowing Urologic: No dysuria, Hematuria, or urinary frequency   Physical Exam:  ED Triage Vitals  Enc Vitals Group     BP 12/22/14 1959 111/63 mmHg     Pulse Rate 12/22/14 1959 61     Resp 12/22/14 2207 20     Temp 12/22/14 1959 97.6 F (36.4 C)     Temp Source 12/22/14 1959 Oral     SpO2 12/22/14 1959 100 %     Weight 12/22/14 1959 147 lb (66.679 kg)     Height 12/22/14 1959 5' (1.524  m)     Head Cir --      Peak Flow --      Pain Score 12/22/14 2006 0     Pain Loc --      Pain Edu? --      Excl. in [place]? --      General: Awake , Alert , and Oriented times 3; GCS 15 Head: Normal cephalic , atraumatic Eyes: Pupils equal , round, reactive to light Nose/Throat: No nasal drainage, patent upper airway without erythema or exudate.  Neck: Supple, Full range of motion, No anterior adenopathy or palpable thyroid masses Lungs: Clear to ascultation without wheezes , rhonchi, or rales Heart: Regular rate, regular rhythm without murmurs , gallops , or rubs Abdomen: Soft, non tender without rebound, guarding , or rigidity; bowel sounds positive and symmetric in all 4 quadrants. No organomegaly .        Extremities: 2 plus symmetric pulses. No edema, clubbing or cyanosis Neurologic: normal ambulation, Motor symmetric without deficits, sensory intact Skin: warm, dry, no rashes   Labs:   All laboratory work was reviewed including any pertinent negatives or positives listed below:  Labs Reviewed  COMPREHENSIVE METABOLIC PANEL - Abnormal; Notable for the following:    Glucose, Bld 160 (*)    Creatinine, Ser 1.12 (*)    Total Protein 6.4 (*)    GFR calc non Af Amer 43 (*)    GFR calc Af Amer 49 (*)    All other components within normal limits  CBC WITH DIFFERENTIAL/PLATELET  TROPONIN I  TSH   review of laboratory work showed no significant findings  EKG:  ED ECG REPORT I, Daymon Larsen, the attending physician, personally viewed and interpreted this ECG.  Date: 12/22/2014 EKG Time: 2020 Rate: 66 Rhythm: normal sinus rhythm QRS Axis: normal Intervals: normal ST/T Wave abnormalities: normal Conduction Disutrbances: none Narrative Interpretation: unremarkable Normal EKG    ED Course: Patient's stay was uneventful and she had no episodes of hypotension and otherwise feels symptomatically fine. Discussion at the bedside I advised the daughter to hold the amlodipine until she can be seen by her primary physician and/or the cardiologist to confirm and just blood pressure medications as  needed  Assessment: Hypotension due to medication   Final Clinical Impression  Final diagnoses:  Hypotension due to drugs     Plan: Patient was advised to return immediately if condition worsens. Patient was advised to follow up with her primary care physician or other specialized physicians involved and in their current assessment.             Daymon Larsen, MD 12/22/14 2218

## 2014-12-22 NOTE — ED Notes (Addendum)
Pt daughter says that the patient has been sleepy and during these times her blood pressure gets low, recording 60's-80's SBP on their home monitor. During the day, they check her blood pressure and it has been running 160's/100's. Pt says she has been feeling more tired than usual, denies pain. Pt has rx for Amlodipine but has not started taking the medication because her daughter wanted her evaluated first. Pt is currently on abx for urinary infection.

## 2014-12-22 NOTE — Discharge Instructions (Signed)
Hypotension As your heart beats, it forces blood through your arteries. This force is your blood pressure. If your blood pressure is too low for you to go about your normal activities or to support the organs of your body, you have hypotension. Hypotension is also referred to as low blood pressure. When your blood pressure becomes too low, you may not get enough blood to your brain. As a result, you may feel weak, feel lightheaded, or develop a rapid heart rate. In a more severe case, you may faint. CAUSES Various conditions can cause hypotension. These include:  Blood loss.  Dehydration.  Heart or endocrine problems.  Pregnancy.  Severe infection.  Not having a well-balanced diet filled with needed nutrients.  Severe allergic reactions (anaphylaxis). Some medicines, such as blood pressure medicine or water pills (diuretics), may lower your blood pressure below normal. Sometimes taking too much medicine or taking medicine not as directed can cause hypotension. TREATMENT  Hospitalization is sometimes required for hypotension if fluid or blood replacement is needed, if time is needed for medicines to wear off, or if further monitoring is needed. Treatment might include changing your diet, changing your medicines (including medicines aimed at raising your blood pressure), and use of support stockings. HOME CARE INSTRUCTIONS   Drink enough fluids to keep your urine clear or pale yellow.  Take your medicines as directed by your health care provider.  Get up slowly from reclining or sitting positions. This gives your blood pressure a chance to adjust.  Wear support stockings as directed by your health care provider.  Maintain a healthy diet by including nutritious food, such as fruits, vegetables, nuts, whole grains, and lean meats. SEEK MEDICAL CARE IF:  You have vomiting or diarrhea.  You have a fever for more than 2-3 days.  You feel more thirsty than usual.  You feel weak and  tired. SEEK IMMEDIATE MEDICAL CARE IF:   You have chest pain or a fast or irregular heartbeat.  You have a loss of feeling in some part of your body, or you lose movement in your arms or legs.  You have trouble speaking.  You become sweaty or feel lightheaded.  You faint. MAKE SURE YOU:   Understand these instructions.  Will watch your condition.  Will get help right away if you are not doing well or get worse.   This information is not intended to replace advice given to you by your health care provider. Make sure you discuss any questions you have with your health care provider.   Document Released: 02/14/2005 Document Revised: 12/05/2012 Document Reviewed: 08/17/2012 Elsevier Interactive Patient Education [employer].  Please return immediately if condition worsens. Please contact her primary physician or the physician you were given for referral. If you have any specialist physicians involved in her treatment and plan please also contact them. Thank you for using [facility]. Please continue to hold the blood pressure medication until further assessment by primary physician and/or cardiologist.

## 2014-12-22 NOTE — ED Notes (Signed)
Patient and daughter with no complaints at this time. Respirations even and unlabored. Skin warm/dry. Discharge instructions reviewed with patient and daughter at this time. Patient and daughter given opportunity to voice concerns/ask questions. IV removed per policy and band-aid applied to site. Patient discharged at this time and left Emergency Department, via wheelchair.

## 2014-12-25 DIAGNOSIS — R400 Somnolence: Secondary | ICD-10-CM | POA: Insufficient documentation

## 2015-01-07 ENCOUNTER — Other Ambulatory Visit: Payer: Self-pay | Admitting: Cardiovascular Disease

## 2015-01-07 ENCOUNTER — Encounter: Payer: Self-pay | Admitting: Nurse Practitioner

## 2015-01-07 ENCOUNTER — Ambulatory Visit (INDEPENDENT_AMBULATORY_CARE_PROVIDER_SITE_OTHER): Payer: Medicare HMO | Admitting: Nurse Practitioner

## 2015-01-07 VITALS — BP 164/98 | HR 75 | Ht 60.0 in | Wt 149.8 lb

## 2015-01-07 DIAGNOSIS — E785 Hyperlipidemia, unspecified: Secondary | ICD-10-CM

## 2015-01-07 DIAGNOSIS — I350 Nonrheumatic aortic (valve) stenosis: Secondary | ICD-10-CM | POA: Diagnosis not present

## 2015-01-07 DIAGNOSIS — I471 Supraventricular tachycardia: Secondary | ICD-10-CM | POA: Diagnosis not present

## 2015-01-07 DIAGNOSIS — I10 Essential (primary) hypertension: Secondary | ICD-10-CM

## 2015-01-07 DIAGNOSIS — I739 Peripheral vascular disease, unspecified: Secondary | ICD-10-CM

## 2015-01-07 DIAGNOSIS — I779 Disorder of arteries and arterioles, unspecified: Secondary | ICD-10-CM

## 2015-01-07 DIAGNOSIS — E042 Nontoxic multinodular goiter: Secondary | ICD-10-CM | POA: Insufficient documentation

## 2015-01-07 DIAGNOSIS — E119 Type 2 diabetes mellitus without complications: Secondary | ICD-10-CM | POA: Insufficient documentation

## 2015-01-07 DIAGNOSIS — I959 Hypotension, unspecified: Secondary | ICD-10-CM | POA: Insufficient documentation

## 2015-01-07 MED ORDER — LISINOPRIL 40 MG PO TABS: ORAL_TABLET | ORAL | Status: DC

## 2015-01-07 NOTE — Patient Instructions (Signed)
Medication Instructions:  Your physician recommends that you continue on your current medications as directed. Please refer to the Current Medication list given to you today.   Labwork: none  Testing/Procedures: none  Follow-Up: Your physician recommends that you schedule a follow-up appointment in: three months with Dr. Rockey Situ   Any Other Special Instructions Will Be Listed Below (If Applicable).     If you need a refill on your cardiac medications before your next appointment, please call your pharmacy.

## 2015-01-07 NOTE — Progress Notes (Signed)
Patient Name: [name] Date of Encounter: 01/07/2015  Primary Care Provider:  [name], MD Primary Cardiologist:  Johnny Bridge, MD   Chief Complaint  81 year old female with moderate aortic stenosis and history of SVT who presents for follow-up.  Past Medical History   Past Medical History  Diagnosis Date  . Essential hypertension   . Hyperlipidemia   . Diabetes mellitus without complication ([place])   . Diastolic dysfunction     a. echo 2014: EF 55-60%, DD, mild LVH, moderate AS;  b. 08/2014 Echo: EF 55-60%, no RWMA, GR1DD, severe AS, mild AI, Mean gradient (S): 38 mm Hg. Valve area (VTI): 0.78 cm^2., mild-mod MR, LA mildly dilated, atrial septal aneurysm, mild TR  . GERD (gastroesophageal reflux disease)   . Glaucoma   . Depression   . Gout   . Hiatal hernia   . Bone disease   . Anemia   . Cervical spine arthritis ([place])   . Vitamin deficiency   . Cognitive impairment   . History of renal impairment   . Stroke [facility])     a. CT head 07/2014 showed small thalamic infarct  . Paroxysmal SVT (supraventricular tachycardia) ([facility])     a. on Toprol   . Aortic stenosis     a. echo 08/2014: EF 55-60%, no RWMA, GR1DD, severe AS, mild AI, Mean gradient (S): 38 mm Hg. Valve area (VTI): 0.78 cm^2., mild-mod MR, LA mildly dilated, atrial septal aneurysm, mild TR  . Multinodular goiter     a. Noted incidentally on CT 07/2012 and carotid U/S 08/2014;  b. Nl TSH 11/2014.  . Carotid arterial disease ([place])     a. 08/2014 U/S: Bilateral < 50% stneosis. Patent vertebrals w/ antegrade flow.   . Hypotension     a. Related to Norvasc - 11/2014 ED visit.   Past Surgical History  Procedure Laterality Date  . Tubal ligation      Allergies  Allergies  Allergen Reactions  . Metformin And Related     HPI  81 year old female with the above complex problems. She has a history of paroxysmal supraventricular tachycardia which has been managed with beta blocker therapy. She also has a  history of moderate to severe aortic stenosis by most recent echo in July 2016 with a mean gradient of 38 mmHg and a peak gradient of 56 mmHg. She has never experienced chest pain, dyspnea, presyncope or syncope. Since her last visit in August, she is continues do reasonably well. Both her daughters are with her today. She notes that she "piddles" around the house most the day and is able to do so without experiencing significant limitations. Her daughter says that she has noted that her mother is sometimes more sleepy than usual but there is no history of PND, orthopnea, dizziness, syncope, edema, or early satiety. Patient says that she thinks she sleeps pretty well at night. She has not had any recent palpitations.  Her blood pressure has been labile at home and initially amlodipine was added to her regimen. Her daughter noted some low blood pressures and would hold the dose if necessary. She was recently evaluated in the emergency department due to lethargy and low blood pressures. She was advised to discontinue amlodipine and she has not been taking it since.  Home Medications  Prior to Admission medications   Medication Sig Start Date End Date Taking? Authorizing Provider  aspirin 81 MG tablet Take 81 mg by mouth daily.   Yes Historical Provider, MD  brimonidine (ALPHAGAN P) 0.1 % SOLN Place 1 drop into the right eye 2 (two) times daily.   Yes Historical Provider, MD  cholecalciferol (VITAMIN D) 1000 UNITS tablet Take 2,000 Units by mouth daily.    Yes Historical Provider, MD  colchicine 0.6 MG tablet Take 1 tablet (0.6 mg total) by mouth daily. Patient taking differently: Take 0.6 mg by mouth 3 (three) times daily as needed.  09/12/14  Yes Vaughan Basta, MD  diphenhydrAMINE (BENADRYL) 25 mg capsule Take 1 capsule (25 mg total) by mouth every 8 (eight) hours as needed for itching. 09/12/14  Yes Vaughan Basta, MD  Dorzolamide HCl-Timolol Mal (COSOPT OP) Apply to eye 2 (two) times  daily.   Yes Historical Provider, MD  gabapentin (NEURONTIN) 300 MG capsule Take 300 mg by mouth 2 (two) times daily.   Yes Historical Provider, MD  glimepiride (AMARYL) 1 MG tablet Take 1 mg by mouth daily with breakfast.   Yes Historical Provider, MD  iron polysaccharides (NIFEREX) 150 MG capsule Take 150 mg by mouth 2 (two) times daily.   Yes Historical Provider, MD  latanoprost (XALATAN) 0.005 % ophthalmic solution Place 1 drop into the right eye at bedtime. 07/03/14  Yes Historical Provider, MD  lisinopril (PRINIVIL,ZESTRIL) 40 MG tablet TAKE 1 TABLET BY MOUTH ONCE DAILY (8AM) Patient taking differently: TAKE 1 TABLET BY MOUTH ONCE DAILY AT BEDTIME. 12/25/13  Yes Minna Merritts, MD  lovastatin (MEVACOR) 40 MG tablet Take 40 mg by mouth at bedtime.   Yes Historical Provider, MD  metoprolol succinate (TOPROL-XL) 25 MG 24 hr tablet Take 3 tablets (75 mg total) by mouth daily. Take with or immediately following a meal. 09/12/14  Yes Vaughan Basta, MD  pantoprazole (PROTONIX) 40 MG tablet Take 40 mg by mouth 2 (two) times daily.   Yes Historical Provider, MD  pilocarpine (PILOCAR) 4 % ophthalmic solution Place 1 drop into the right eye 4 (four) times daily.    Yes Historical Provider, MD  tiZANidine (ZANAFLEX) 4 MG tablet Take 1 tablet by mouth 3 (three) times daily as needed. 08/08/14  Yes Historical Provider, MD    Review of Systems  Patient has had some daytime sleepiness despite sleeping well at night. She denies chest pain, syncope, presyncope, dyspnea, PND, orthopnea, edema, or early satiety.  All other systems reviewed and are otherwise negative except as noted above.  Physical Exam  VS:  BP 164/98 mmHg  Pulse 75  Ht 5' (1.524 m)  Wt 149 lb 12 oz (67.926 kg)  BMI 29.25 kg/m2 , BMI Body mass index is 29.25 kg/(m^2). GEN: Well nourished, well developed, in no acute distress. HEENT: normal. Neck: Supple, no JVD, carotid bruits, or masses. Cardiac: RRR, 3/6 systolic ejection  murmur loudest at the right upper sternal border but heard throughout. There is a crisp S2. No  gallops. No clubbing, cyanosis, edema.  Radials/DP/PT 2+ and equal bilaterally.  Respiratory:  Respirations regular and unlabored, clear to auscultation bilaterally. GI: Soft, nontender, nondistended, BS + x 4. MS: no deformity or atrophy. Skin: warm and dry, no rash. Neuro:  Strength and sensation are intact. Psych: Normal affect.  Accessory Clinical Findings  None  Assessment & Plan  1.  Moderate to severe aortic stenosis: This is asymptomatic. Patient's daughters had many questions about the pathophysiology, hemodynamics, progression, and management of aortic stenosis. We spent approximately 30 minutes discussing this including available procedures for management of symptomatically aortic stenosis. This patient is currently asymptomatic, we will continue conservative therapy.  She is on a beta blocker and statin. Family is aware to contact us if she develops chest pain, presyncope/syncope, or dyspnea. I recommended that as a family, they begin to discuss whether or not they would be interested in something like a transcutaneous aortic valve replacement in the future if she would ever become symptomatic.  2. Paroxysmal supraventricular tachycardia: No recent recurrence. Continue beta blocker therapy.  3. Labile hypertension with periodic orthostasis and hypotension: Patient was recently evaluated in the emergency department secondary to low blood pressures. Amlodipine was discontinued. Blood pressure is high today. Her daughter has been checking her blood pressure at home. I suspect that we will just need to live with a higher resting blood pressure in order to prevent hypotension and orthostasis in this somewhat frail, elderly female.  4. Bilateral carotid disease: She has bilateral less than 50% stenosis. Continue aspirin and statin.  5. Hyperlipidemia: Continue statin therapy.  6. Type 2  diabetes mellitus: Management per primary care.  7. History of multinodular goiter: This has been incidentally noted on imaging dating back to 2014. He was most recently noted on ultrasound July 2016. TSH was normal October 2016. Follow-up with primary care.  8.  Disposition: Follow-up with Dr. Rockey Situ in 3 months.  Murray Hodgkins, NP 01/07/2015, 12:59 PM

## 2015-04-09 ENCOUNTER — Encounter: Payer: Self-pay | Admitting: *Deleted

## 2015-04-09 ENCOUNTER — Ambulatory Visit: Payer: Self-pay | Admitting: Cardiovascular Disease

## 2015-06-02 ENCOUNTER — Encounter: Payer: Self-pay | Admitting: Cardiovascular Disease

## 2015-06-02 ENCOUNTER — Ambulatory Visit (INDEPENDENT_AMBULATORY_CARE_PROVIDER_SITE_OTHER): Payer: Medicare HMO | Admitting: Cardiovascular Disease

## 2015-06-02 VITALS — BP 180/102 | HR 64 | Ht 61.0 in | Wt 152.8 lb

## 2015-06-02 DIAGNOSIS — E1159 Type 2 diabetes mellitus with other circulatory complications: Secondary | ICD-10-CM

## 2015-06-02 DIAGNOSIS — I10 Essential (primary) hypertension: Secondary | ICD-10-CM

## 2015-06-02 DIAGNOSIS — I5032 Chronic diastolic (congestive) heart failure: Secondary | ICD-10-CM

## 2015-06-02 DIAGNOSIS — I350 Nonrheumatic aortic (valve) stenosis: Secondary | ICD-10-CM

## 2015-06-02 DIAGNOSIS — I214 Non-ST elevation (NSTEMI) myocardial infarction: Secondary | ICD-10-CM | POA: Diagnosis not present

## 2015-06-02 DIAGNOSIS — I471 Supraventricular tachycardia: Secondary | ICD-10-CM | POA: Diagnosis not present

## 2015-06-02 MED ORDER — AMLODIPINE BESYLATE 10 MG PO TABS: 10.0000 mg | ORAL_TABLET | Freq: Every day | ORAL | Status: DC

## 2015-06-02 NOTE — Patient Instructions (Addendum)
Blood pressure is elevated, Please start amlodipine in the morning  Please monitor your blood pressure Call the office with numbers  Please call us if you have new issues that need to be addressed before your next appt.  Your physician wants you to follow-up in: 3 months.  You will receive a reminder letter in the mail two months in advance. If you don't receive a letter, please call our office to schedule the follow-up appointment.

## 2015-06-02 NOTE — Assessment & Plan Note (Signed)
She denies any symptoms concerning for arrhythmia We will continue metoprolol at current dose

## 2015-06-02 NOTE — Assessment & Plan Note (Signed)
Would work on blood pressure for now Currently not on diuretic. High risk of diastolic CHF in the setting of poorly controlled blood pressure

## 2015-06-02 NOTE — Progress Notes (Signed)
Patient ID: LYLLAH [name], female    DOB: Mar 11, 1924, 82 y.o.   MRN: [id number]  HPI Comments: [name] is a pleasant 82 year old woman with a history of diabetes, hypertension, SVT,    with hospital admission July 2016 for SVT after she developed numerous bug bites with associated pain. She presents for follow-up of her arrhythmia  Daughter presents with her In follow-up today, she reports that she feels well occasional ankle edema, some shortness of breath. If she gets tired, she sits down and recovers then goes again. Denies any significant chest pain concerning for angina. Denies having any tachycardia concerning for arrhythmia  Blood pressure elevated on today's visit She does not check her blood pressure at home that she does have a blood pressure cuff    EKG on today's visit shows normal sinus rhythm with rate 64 bpm, no significant ST or T-wave changes, PVC noted, LVH by voltage criteria   Other past medical history   on her hospital admission July 2016,She reports that she had a rash at the time. Turns out she went walking in her yard, developed insect bites all of her legs and abdomen (chiger bits). She was in a tremendous amount of discomfort with severe itching. Likely secondary to that, she developed arrhythmia. she converted back to normal sinus rhythm in the hospital. At the time of discharge her metoprolol was increased from 50 up to 75 mg daily. She is felt well with no further episodes of tachycardia. She currently lives at home, independent   admitted to the hospital 09/20/2012 discharged on 09/25/2012 for Klebsiella UTI, shortness breath, tachycardia, SVT. Arrival to the hospital, she was very weak and was unable to swallow. She was felt to be very dehydrated. Her potassium was repleted, she was started on metoprolol and Cardizem.  Echocardiogram in the hospital 09/19/2012 showed ejection fraction 0000000, diastolic dysfunction, mild LVH, moderate aortic valve  stenosis, normal RV function and size  CT scan of the neck showed enlargement of the thyroid with multinodular appearance, atherosclerotic disease, right pleural effusion Lab work in the hospital 09/11/2012 showing creatinine 1.57, BUN 44 Followup blood work showed creatinine 1.07, BUN 17 on July 23     Allergies  Allergen Reactions  . Metformin And Related     Current Outpatient Prescriptions on File Prior to Visit  Medication Sig Dispense Refill  . aspirin 81 MG tablet Take 81 mg by mouth daily.    . brimonidine (ALPHAGAN P) 0.1 % SOLN Place 1 drop into the right eye 2 (two) times daily.    . cholecalciferol (VITAMIN D) 1000 UNITS tablet Take 2,000 Units by mouth daily.     . diphenhydrAMINE (BENADRYL) 25 mg capsule Take 1 capsule (25 mg total) by mouth every 8 (eight) hours as needed for itching. 15 capsule 0  . Dorzolamide HCl-Timolol Mal (COSOPT OP) Apply to eye 2 (two) times daily.    Marland Kitchen gabapentin (NEURONTIN) 300 MG capsule Take 300 mg by mouth 2 (two) times daily.    Marland Kitchen glimepiride (AMARYL) 1 MG tablet Take 1 mg by mouth daily with breakfast.    . iron polysaccharides (NIFEREX) 150 MG capsule Take 150 mg by mouth 2 (two) times daily.    Marland Kitchen latanoprost (XALATAN) 0.005 % ophthalmic solution Place 1 drop into the right eye at bedtime.  3  . lisinopril (PRINIVIL,ZESTRIL) 40 MG tablet TAKE 1 TABLET BY MOUTH ONCE DAILY (8 AM) 30 tablet 5  . lovastatin (MEVACOR) 40 MG tablet Take 40  mg by mouth at bedtime.    . metoprolol succinate (TOPROL-XL) 25 MG 24 hr tablet Take 3 tablets (75 mg total) by mouth daily. Take with or immediately following a meal. 120 tablet 0  . pantoprazole (PROTONIX) 40 MG tablet Take 40 mg by mouth 2 (two) times daily.    . pilocarpine (PILOCAR) 4 % ophthalmic solution Place 1 drop into the right eye 4 (four) times daily.     Marland Kitchen tiZANidine (ZANAFLEX) 4 MG tablet Take 1 tablet by mouth 3 (three) times daily as needed.  1   No current facility-administered medications  on file prior to visit.    Past Medical History  Diagnosis Date  . Essential hypertension   . Hyperlipidemia   . Diabetes mellitus without complication ([place])   . Diastolic dysfunction     a. echo 2014: EF 55-60%, DD, mild LVH, moderate AS;  b. 08/2014 Echo: EF 55-60%, no RWMA, GR1DD, severe AS, mild AI, Mean gradient (S): 38 mm Hg. Valve area (VTI): 0.78 cm^2., mild-mod MR, LA mildly dilated, atrial septal aneurysm, mild TR  . GERD (gastroesophageal reflux disease)   . Glaucoma   . Depression   . Gout   . Hiatal hernia   . Bone disease   . Anemia   . Cervical spine arthritis ([place])   . Vitamin deficiency   . Cognitive impairment   . History of renal impairment   . Stroke [facility])     a. CT head 07/2014 showed small thalamic infarct  . Paroxysmal SVT (supraventricular tachycardia) (H[facility])     a. on Toprol   . Aortic stenosis     a. echo 08/2014: EF 55-60%, no RWMA, GR1DD, severe AS, mild AI, Mean gradient (S): 38 mm Hg. Valve area (VTI): 0.78 cm^2., mild-mod MR, LA mildly dilated, atrial septal aneurysm, mild TR  . Multinodular goiter     a. Noted incidentally on CT 07/2012 and carotid U/S 08/2014;  b. Nl TSH 11/2014.  . Carotid arterial disease ([place])     a. 08/2014 U/S: Bilateral < 50% stneosis. Patent vertebrals w/ antegrade flow.   . Hypotension     a. Related to Norvasc - 11/2014 ED visit.    Past Surgical History  Procedure Laterality Date  . Tubal ligation      Social History  reports that she has never smoked. She does not have any smokeless tobacco history on file. She reports that she does not drink alcohol or use illicit drugs.  Family History family history includes Colitis in her father; Diabetes in her mother; Glaucoma in her mother; Peptic Ulcer in her father.   Review of Systems  Constitutional: Negative.   Respiratory: Positive for shortness of breath.   Cardiovascular: Negative.   Gastrointestinal: Negative.   Musculoskeletal: Positive for gait problem.   Neurological: Negative.   Hematological: Negative.   Psychiatric/Behavioral: Negative.   All other systems reviewed and are negative.   BP 180/102 mmHg  Pulse 64  Ht 5\' 1"  (1.549 m)  Wt 152 lb 12 oz (69.287 kg)  BMI 28.88 kg/m2 Repeat blood pressure A999333 systolic taken by myself, diastolic A999333  Physical Exam  Constitutional: She is oriented to person, place, and time. She appears well-developed and well-nourished.  HENT:  Head: Normocephalic.  Nose: Nose normal.  Mouth/Throat: Oropharynx is clear and moist.  Eyes: Conjunctivae are normal. Pupils are equal, round, and reactive to light.  Neck: Normal range of motion. Neck supple. No JVD present.  Cardiovascular: Normal rate, regular  rhythm, S1 normal, S2 normal and intact distal pulses.  Exam reveals no gallop and no friction rub.   Murmur heard.  Crescendo systolic murmur is present with a grade of 2/6  Pulmonary/Chest: Effort normal and breath sounds normal. No respiratory distress. She has no wheezes. She has no rales. She exhibits no tenderness.  Abdominal: Soft. Bowel sounds are normal. She exhibits no distension. There is no tenderness.  Musculoskeletal: Normal range of motion. She exhibits no edema or tenderness.  Lymphadenopathy:    She has no cervical adenopathy.  Neurological: She is alert and oriented to person, place, and time. Coordination normal.  Skin: Skin is warm and dry. No rash noted. No erythema.  Psychiatric: She has a normal mood and affect. Her behavior is normal. Judgment and thought content normal.    Assessment and Plan  Nursing note and vitals reviewed.

## 2015-06-02 NOTE — Assessment & Plan Note (Signed)
We have encouraged continued exercise, careful diet management in an effort to lose weight.   Total encounter time more than 25 minutes  Greater than 50% was spent in counseling and coordination of care with the patient 

## 2015-06-02 NOTE — Assessment & Plan Note (Signed)
Blood pressure markedly elevated today, committed she start amlodipine 10 mg daily Daughter also reports that her pressure was elevated at the dentist recently  we will monitor closely for leg edema as a side effect of the calcium channel blocker Suggested they check blood pressure at home and call our office with numbers for further medication titration

## 2015-06-02 NOTE — Assessment & Plan Note (Signed)
Long discussion concerning her aortic valve, mean and peak pressure gradient and peak velocity consistent with moderate mitral valve stenosis No significant change between 2014 and 2016 We will repeat echocardiogram 2018

## 2015-06-05 ENCOUNTER — Telehealth: Payer: Self-pay | Admitting: Cardiovascular Disease

## 2015-06-05 NOTE — Telephone Encounter (Signed)
BP READINGS   06/03/15  1:56 pm   140/ 82    06/04/15  2:49 pm   152/83   06/05/15  4:44 pm     161/88

## 2015-06-09 NOTE — Telephone Encounter (Signed)
Would stay on amlodipine In addition, would start HCTZ 12.5 mg daily Continue to monitor pressure

## 2015-06-09 NOTE — Telephone Encounter (Signed)
Mandi, can you call and follow up

## 2015-06-09 NOTE — Telephone Encounter (Signed)
Pt was advised @ ov 06/02/15:   Blood pressure is elevated, Please start amlodipine in the morning  Please monitor your blood pressure Call the office with numbers   [name] reports that pt is still having some swelling in her feet, but this was present prior to her ov.  Pt feels ok, did have some GI issues last night, but she does not feel these are r/t her meds, as she another family member w/ similar issues. She has been taking her amlodipine as prescribed. Sat 2:31 129/82 Sun 2:35 155/87 She did not check yesterday, but will go by today.  She would like to know if Dr. Rockey Situ feels these numbers are too high, and if so, if any med changes need to be made.

## 2015-06-10 MED ORDER — HYDROCHLOROTHIAZIDE 12.5 MG PO CAPS: 12.5000 mg | ORAL_CAPSULE | Freq: Every day | ORAL | Status: DC

## 2015-06-10 NOTE — Telephone Encounter (Signed)
Spoke w/ [name].  Advised her of Dr. Donivan Scull recommendation.  She is agreeable and will call back in a week or 2 to let us know how pt's BP is running.

## 2015-06-24 ENCOUNTER — Telehealth: Payer: Self-pay | Admitting: Cardiovascular Disease

## 2015-06-24 NOTE — Telephone Encounter (Signed)
Pt c/o BP issue: STAT if pt c/o blurred vision, one-sided weakness or slurred speech  1. What are your last 5 BP readings?   06/22/15: am 131/79  Pm 118/74 06/23/15: am 106/66 PM 76/47 06/24/15:AM 130/70 (This is without taking medication)   2. Are you having any other symptoms (ex. Dizziness, headache, blurred vision, passed out)? No, just a bit tired.   3. What is your BP issue? Was told to call back with BP readings. Pt went to beach and did not take her night medication. Just started back on this.  Not sure if we need to adjust the medication.

## 2015-06-24 NOTE — Telephone Encounter (Signed)
Patients daughter [name] stated that her mother went to the beach and had missed 2 days of her blood pressure medications. She reported that her blood pressures have been all over the place and has not felt well. Instructed her that when those medications are missed it can cause fluctuations in blood pressures and make her not feel well and cause those blood pressures to vary. Her blood pressure this morning was 130/70. Instructed her to get her mother back on her regular medication schedule and continue monitoring her blood pressures. Also let her know to call back in if she has any further symptoms. Her daughter verbalized understanding of all instructions and had no further questions at this time.

## 2015-06-26 ENCOUNTER — Telehealth: Payer: Self-pay | Admitting: Cardiovascular Disease

## 2015-06-26 NOTE — Telephone Encounter (Signed)
BP readings: 06/24/15   Am 130/70 p 77, pm         06/25/15  Am 116/72 p 68, pm 125/76 p 75 06/26/15  Am 113/66 p 60, pm 118/67

## 2015-06-26 NOTE — Telephone Encounter (Signed)
Spoke w/ [name]. She reports that pt has been taking her meds as prescribed and feels good. Advised her that readings look good.  She will continue to monitor and call w/ any changes.

## 2015-07-03 ENCOUNTER — Telehealth: Payer: Self-pay | Admitting: Cardiovascular Disease

## 2015-07-03 NOTE — Telephone Encounter (Signed)
Agree with your note No changes

## 2015-07-03 NOTE — Telephone Encounter (Signed)
Spoke w/ [name].   Pt's BPs continue to look good, but she does report swelling in her feet and ankles.  She reports that edema is chronic but seems to be worse lately, but cannot say whether or not it worsened when she started amlodipine.  Pt does not weigh herself daily, does not have compression hose. She recently went to the beach and did not follow low sodium diet, had "a lot of ribs". Pt takes HCTZ 12.5 mg daily. I don't want to tell her take an extra due to low BP today (she reports that she feels good).  Advised [name] to try to get pt TED hose or ACE bandages to wrap pt's legs, try to keep them elevated when sitting. She understands that pt's diet discrepancy may have affected her sx. She recently bought pt scales, she will stress the importance of daily wts, 1st thing in the am after she urinates and before she eats.  Advised her that I will make Dr. Rockey Situ aware of her concerns and call her back if he has any further recommendations.

## 2015-07-03 NOTE — Telephone Encounter (Signed)
Daughter calling in bp readings:  06/27/15 4:18 pm 122/73 p 70 04/30  11:42 am 128/95 p 56     05/01  12:36 pm 108/62 p 64 05/02  01/50 pm 114/64 p 62 05/03  10:29 am 123/67 p 64 05/04  11:51 am 119/64 p 58 05/05 12:18 pm 95/59 p 58  Pt c/o swelling: STAT is pt has developed SOB within 24 hours  1. How long have you been experiencing swelling? All week  2. Where is the swelling located? Both feet and ankles  3.  Are you currently taking a "fluid pill"? yes  4.  Are you currently SOB? no  5.  Have you traveled recently? Went to [facility] about 3 weeks.

## 2015-07-07 DIAGNOSIS — R600 Localized edema: Secondary | ICD-10-CM | POA: Insufficient documentation

## 2015-07-16 ENCOUNTER — Emergency Department
Admission: EM | Admit: 2015-07-16 | Discharge: 2015-07-16 | Disposition: A | Discharge status: Home / Self Care | Payer: Medicare HMO | Attending: Emergency Medicine | Admitting: Emergency Medicine

## 2015-07-16 ENCOUNTER — Emergency Department: Payer: Medicare HMO

## 2015-07-16 DIAGNOSIS — Y999 Unspecified external cause status: Secondary | ICD-10-CM | POA: Diagnosis not present

## 2015-07-16 DIAGNOSIS — S40012A Contusion of left shoulder, initial encounter: Secondary | ICD-10-CM

## 2015-07-16 DIAGNOSIS — I252 Old myocardial infarction: Secondary | ICD-10-CM | POA: Diagnosis not present

## 2015-07-16 DIAGNOSIS — I110 Hypertensive heart disease with heart failure: Secondary | ICD-10-CM | POA: Insufficient documentation

## 2015-07-16 DIAGNOSIS — I5032 Chronic diastolic (congestive) heart failure: Secondary | ICD-10-CM | POA: Insufficient documentation

## 2015-07-16 DIAGNOSIS — Z79899 Other long term (current) drug therapy: Secondary | ICD-10-CM | POA: Diagnosis not present

## 2015-07-16 DIAGNOSIS — Z8673 Personal history of transient ischemic attack (TIA), and cerebral infarction without residual deficits: Secondary | ICD-10-CM | POA: Insufficient documentation

## 2015-07-16 DIAGNOSIS — E119 Type 2 diabetes mellitus without complications: Secondary | ICD-10-CM | POA: Insufficient documentation

## 2015-07-16 DIAGNOSIS — W1800XA Striking against unspecified object with subsequent fall, initial encounter: Secondary | ICD-10-CM | POA: Insufficient documentation

## 2015-07-16 DIAGNOSIS — Y929 Unspecified place or not applicable: Secondary | ICD-10-CM | POA: Diagnosis not present

## 2015-07-16 DIAGNOSIS — S0990XA Unspecified injury of head, initial encounter: Secondary | ICD-10-CM | POA: Insufficient documentation

## 2015-07-16 DIAGNOSIS — F329 Major depressive disorder, single episode, unspecified: Secondary | ICD-10-CM | POA: Diagnosis not present

## 2015-07-16 DIAGNOSIS — S0003XA Contusion of scalp, initial encounter: Secondary | ICD-10-CM | POA: Insufficient documentation

## 2015-07-16 DIAGNOSIS — S93402A Sprain of unspecified ligament of left ankle, initial encounter: Secondary | ICD-10-CM | POA: Insufficient documentation

## 2015-07-16 DIAGNOSIS — S99912A Unspecified injury of left ankle, initial encounter: Secondary | ICD-10-CM | POA: Diagnosis present

## 2015-07-16 DIAGNOSIS — Z7982 Long term (current) use of aspirin: Secondary | ICD-10-CM | POA: Diagnosis not present

## 2015-07-16 DIAGNOSIS — E785 Hyperlipidemia, unspecified: Secondary | ICD-10-CM | POA: Insufficient documentation

## 2015-07-16 DIAGNOSIS — N19 Unspecified kidney failure: Secondary | ICD-10-CM | POA: Insufficient documentation

## 2015-07-16 DIAGNOSIS — Y9389 Activity, other specified: Secondary | ICD-10-CM | POA: Diagnosis not present

## 2015-07-16 DIAGNOSIS — Z7984 Long term (current) use of oral hypoglycemic drugs: Secondary | ICD-10-CM | POA: Insufficient documentation

## 2015-07-16 NOTE — ED Provider Notes (Signed)
[facility] Emergency Department Provider Note   ____________________________________________  Time seen: Approximately 12:17 PM  I have reviewed the triage vital signs and the nursing notes.   HISTORY  Chief Complaint Fall   HPI [name] is a 82 y.o. female is sent in here today from [facility] podiatry after a history of falling 5-6 days ago. Patient states that it was a mechanical fall in which she lost her balance when she used her foot to close a door. Patient states that she fell backwards striking her head on the floor and injuring her left shoulder and left ankle. Patient denies any loss of consciousness and has continued her normal activity. She denies any visual changes, nausea or vomiting. There is been no history of headache since her fall. Patient continues to take 81 mg of aspirin per day. She also complains of pain in the left shoulder with range of motion and also left ankle pain.   Past Medical History  Diagnosis Date  . Essential hypertension   . Hyperlipidemia   . Diabetes mellitus without complication ([place])   . Diastolic dysfunction     a. echo 2014: EF 55-60%, DD, mild LVH, moderate AS;  b. 08/2014 Echo: EF 55-60%, no RWMA, GR1DD, severe AS, mild AI, Mean gradient (S): 38 mm Hg. Valve area (VTI): 0.78 cm^2., mild-mod MR, LA mildly dilated, atrial septal aneurysm, mild TR  . GERD (gastroesophageal reflux disease)   . Glaucoma   . Depression   . Gout   . Hiatal hernia   . Bone disease   . Anemia   . Cervical spine arthritis ([place])   . Vitamin deficiency   . Cognitive impairment   . History of renal impairment   . Stroke [facility])     a. CT head 07/2014 showed small thalamic infarct  . Paroxysmal SVT (supraventricular tachycardia) ([facility])     a. on Toprol   . Aortic stenosis     a. echo 08/2014: EF 55-60%, no RWMA, GR1DD, severe AS, mild AI, Mean gradient (S): 38 mm Hg. Valve area (VTI): 0.78 cm^2., mild-mod MR, LA mildly  dilated, atrial septal aneurysm, mild TR  . Multinodular goiter     a. Noted incidentally on CT 07/2012 and carotid U/S 08/2014;  b. Nl TSH 11/2014.  . Carotid arterial disease ([place])     a. 08/2014 U/S: Bilateral < 50% stneosis. Patent vertebrals w/ antegrade flow.   . Hypotension     a. Related to Norvasc - 11/2014 ED visit.    Patient Active Problem List   Diagnosis Date Noted  . Essential hypertension   . Hyperlipidemia   . Diabetes mellitus without complication ([place])   . Paroxysmal SVT (supraventricular tachycardia) ([place])   . Multinodular goiter   . Carotid arterial disease ([place])   . Hypotension   . Aortic stenosis   . NSTEMI (non-ST elevated myocardial infarction) ([place]) 09/10/2014  . Renal failure 08/28/2014  . SVT (supraventricular tachycardia) ([place]) 12/17/2012  . Diabetes mellitus, type 2 ([place]) 12/17/2012  . SOB (shortness of breath) 12/17/2012  . Essential hypertension, malignant 12/17/2012  . Chronic diastolic CHF (congestive heart failure) ([place]) 12/17/2012    Past Surgical History  Procedure Laterality Date  . Tubal ligation      Current Outpatient Rx  Name  Route  Sig  Dispense  Refill  . allopurinol (ZYLOPRIM) 300 MG tablet   Oral   Take 300 mg by mouth daily.         Marland Kitchen  amLODipine (NORVASC) 10 MG tablet   Oral   Take 1 tablet (10 mg total) by mouth daily.   30 tablet   6   . aspirin 81 MG tablet   Oral   Take 81 mg by mouth daily.         . brimonidine (ALPHAGAN P) 0.1 % SOLN   Right Eye   Place 1 drop into the right eye 2 (two) times daily.         . cholecalciferol (VITAMIN D) 1000 UNITS tablet   Oral   Take 2,000 Units by mouth daily.          . diphenhydrAMINE (BENADRYL) 25 mg capsule   Oral   Take 1 capsule (25 mg total) by mouth every 8 (eight) hours as needed for itching.   15 capsule   0   . Dorzolamide HCl-Timolol Mal (COSOPT OP)   Ophthalmic   Apply to eye 2 (two) times daily.         Marland Kitchen gabapentin (NEURONTIN) 300 MG  capsule   Oral   Take 300 mg by mouth 2 (two) times daily.         Marland Kitchen glimepiride (AMARYL) 1 MG tablet   Oral   Take 1 mg by mouth daily with breakfast.         . hydrochlorothiazide (MICROZIDE) 12.5 MG capsule   Oral   Take 1 capsule (12.5 mg total) by mouth daily.   90 capsule   3   . iron polysaccharides (NIFEREX) 150 MG capsule   Oral   Take 150 mg by mouth 2 (two) times daily.         Marland Kitchen latanoprost (XALATAN) 0.005 % ophthalmic solution   Right Eye   Place 1 drop into the right eye at bedtime.      3   . lisinopril (PRINIVIL,ZESTRIL) 40 MG tablet      TAKE 1 TABLET BY MOUTH ONCE DAILY (8 AM)   30 tablet   5   . lovastatin (MEVACOR) 40 MG tablet   Oral   Take 40 mg by mouth at bedtime.         . metoprolol succinate (TOPROL-XL) 25 MG 24 hr tablet   Oral   Take 3 tablets (75 mg total) by mouth daily. Take with or immediately following a meal.   120 tablet   0   . pantoprazole (PROTONIX) 40 MG tablet   Oral   Take 40 mg by mouth 2 (two) times daily.         . pilocarpine (PILOCAR) 4 % ophthalmic solution   Right Eye   Place 1 drop into the right eye 4 (four) times daily.          Marland Kitchen tiZANidine (ZANAFLEX) 4 MG tablet   Oral   Take 1 tablet by mouth 3 (three) times daily as needed.      1     Allergies Metformin and related  Family History  Problem Relation Age of Onset  . Glaucoma Mother   . Diabetes Mother   . Peptic Ulcer Father   . Colitis Father     Social History Social History  Substance Use Topics  . Smoking status: Never Smoker   . Smokeless tobacco: Not on file  . Alcohol Use: No    Review of Systems Constitutional: No fever/chills Eyes: No visual changes. ENT: No trauma Cardiovascular: Denies chest pain. Respiratory: Denies shortness of breath. Gastrointestinal: No abdominal pain.  No nausea,  no vomiting.   Genitourinary: Negative for dysuria. Musculoskeletal: Negative for back pain. Skin: Negative for  rash. Neurological: Negative for headaches, no focal weakness or numbness.  10-point ROS otherwise negative.  ____________________________________________   PHYSICAL EXAM:  VITAL SIGNS: ED Triage Vitals  Enc Vitals Group     BP 07/16/15 1139 136/68 mmHg     Pulse Rate 07/16/15 1139 71     Resp 07/16/15 1139 18     Temp 07/16/15 1139 97.1 F (36.2 C)     Temp Source 07/16/15 1139 Oral     SpO2 07/16/15 1139 100 %     Weight 07/16/15 1139 152 lb (68.947 kg)     Height 07/16/15 1139 5' (1.524 m)     Head Cir --      Peak Flow --      Pain Score --      Pain Loc --      Pain Edu? --      Excl. in [place]? --     Constitutional: Alert and oriented. Well appearing and in no acute distress, Very pleasant and talkative. Eyes: Conjunctivae are normal. PERRL. EOMI. Head: Atraumatic.Nontender to palpation of the scalp Nose: No congestion/rhinnorhea. Mouth/Throat: Mucous membranes are moist.  Oropharynx non-erythematous. Neck: No stridor.  No cervical tenderness on palpation posteriorly. Range of motion is within normal limits and without pain. Cardiovascular: Normal rate, regular rhythm. Grossly normal heart sounds.  Good peripheral circulation. Respiratory: Normal respiratory effort.  No retractions. Lungs CTAB. Gastrointestinal: Soft and nontender. No distention. Bowel sounds normoactive 4 quadrants. Musculoskeletal: Moves upper and lower extremities without assistance. There is no deformity noted on examination of the left shoulder. Patient does have some soft tissue tenderness on palpation posteriorly of the left shoulder. Range of motion is minimally restricted secondary to discomfort. Nontender to palpation bilateral hips and pelvis. Knees without effusion or difficulty with range of motion. Left foot and ankle with soft tissue edema. There is tenderness on palpation of the lateral aspect of the left ankle. Pulses positive motor sensory function intact. Thoracic and lumbar spine  nontender to palpation. Neurologic:  Normal speech and language. No gross focal neurologic deficits are appreciated.  Skin:  Skin is warm, dry and intact. No rash noted. Psychiatric: Mood and affect are normal. Speech and behavior are normal.  ____________________________________________   LABS (all labs ordered are listed, but only abnormal results are displayed)  Labs Reviewed - No data to display ____________________________________________  EKG  Deferred ____________________________________________  RADIOLOGY  CT per radiologist shows no acute intracranial abnormality. Left shoulder per radiologist shows no fracture dislocation. Left ankle per radiologist shows diffuse soft tissue swelling around the ankle but no acute fracture or subluxation seen. ____________________________________________   PROCEDURES  Procedure(s) performed: None  Critical Care performed: No  ____________________________________________   INITIAL IMPRESSION / ASSESSMENT AND PLAN / ED COURSE  Pertinent labs & imaging results that were available during my care of the patient were reviewed by me and considered in my medical decision making (see chart for details).  Patient and family was informed that x-rays did not show any acute findings. Patient is continue her regular medication and follow up with her primary care doctor if any continued problems. She also had a stirrup ankle splint applied to her left ankle for extra support. She is to follow-up with her podiatrist any continued problems with her foot or ankle and to elevate her foot as needed for swelling. ____________________________________________   FINAL CLINICAL IMPRESSION(S) / ED  DIAGNOSES  Final diagnoses:  Contusion of scalp, initial encounter  Contusion of left shoulder, initial encounter  Sprain of left ankle, initial encounter      NEW MEDICATIONS STARTED DURING THIS VISIT:  New Prescriptions   No medications on file      Note:  This document was prepared using Dragon voice recognition software and may include unintentional dictation errors.    Johnn Hai, PA-C 07/16/15 1304  Harvest Dark, MD 07/16/15 1339

## 2015-07-16 NOTE — ED Notes (Signed)
Pt here from [facility] podiatry; reports she fell on Friday and having pain to left shoulder, left ankle and hit head. Denies LOC, denies any vomiting. Pt reports she takes 81mg  of aspirin daily.

## 2015-07-16 NOTE — Discharge Instructions (Signed)
Contusion A contusion is a deep bruise. Contusions happen when an injury causes bleeding under the skin. Symptoms of bruising include pain, swelling, and discolored skin. The skin may turn blue, purple, or yellow. HOME CARE   Rest the injured area.  If told, put ice on the injured area.  Put ice in a plastic bag.  Place a towel between your skin and the bag.  Leave the ice on for 20 minutes, 2-3 times per day.  If told, put light pressure (compression) on the injured area using an elastic bandage. Make sure the bandage is not too tight. Remove it and put it back on as told by your doctor.  If possible, raise (elevate) the injured area above the level of your heart while you are sitting or lying down.  Take over-the-counter and prescription medicines only as told by your doctor. GET HELP IF:  Your symptoms do not get better after several days of treatment.  Your symptoms get worse.  You have trouble moving the injured area. GET HELP RIGHT AWAY IF:   You have very bad pain.  You have a loss of feeling (numbness) in a hand or foot.  Your hand or foot turns pale or cold.   This information is not intended to replace advice given to you by your health care provider. Make sure you discuss any questions you have with your health care provider.   Document Released: 08/03/2007 Document Revised: 11/05/2014 Document Reviewed: 07/02/2014 Elsevier Interactive Patient Education 2016 [employer]  Ankle Sprain An ankle sprain is an injury to the strong, fibrous tissues (ligaments) that hold your ankle bones together.  HOME CARE   Put ice on your ankle for 1-2 days or as told by your doctor.  Put ice in a plastic bag.  Place a towel between your skin and the bag.  Leave the ice on for 15-20 minutes at a time, every 2 hours while you are awake.  Only take medicine as told by your doctor.  Raise (elevate) your injured ankle above the level of your heart as much as possible for  2-3 days.  Use crutches if your doctor tells you to. Slowly put your own weight on the affected ankle. Use the crutches until you can walk without pain.  If you have a plaster splint:  Do not rest it on anything harder than a pillow for 24 hours.  Do not put weight on it.  Do not get it wet.  Take it off to shower or bathe.  If given, use an elastic wrap or support stocking for support. Take the wrap off if your toes lose feeling (numb), tingle, or turn cold or blue.  If you have an air splint:  Add or let out air to make it comfortable.  Take it off at night and to shower and bathe.  Wiggle your toes and move your ankle up and down often while you are wearing it. GET HELP IF:  You have rapidly increasing bruising or puffiness (swelling).  Your toes feel very cold.  You lose feeling in your foot.  Your medicine does not help your pain. GET HELP RIGHT AWAY IF:   Your toes lose feeling (numb) or turn blue.  You have severe pain that is increasing. MAKE SURE YOU:   Understand these instructions.  Will watch your condition.  Will get help right away if you are not doing well or get worse.   This information is not intended to replace advice given  to you by your health care provider. Make sure you discuss any questions you have with your health care provider.   Document Released: 08/03/2007 Document Revised: 03/07/2014 Document Reviewed: 08/29/2011 Elsevier Interactive Patient Education 2016 [place] with your primary care doctor if any continued problems. Continue your medication as prescribed. Use ankle splint for support as needed. Elevate foot as needed for swelling. Follow-up with your podiatrist if any continued problems with your foot or ankle.

## 2015-09-14 ENCOUNTER — Ambulatory Visit (INDEPENDENT_AMBULATORY_CARE_PROVIDER_SITE_OTHER): Payer: Medicare HMO | Admitting: Cardiovascular Disease

## 2015-09-14 ENCOUNTER — Encounter: Payer: Self-pay | Admitting: Cardiovascular Disease

## 2015-09-14 VITALS — BP 144/84 | HR 68 | Ht 60.0 in | Wt 157.2 lb

## 2015-09-14 DIAGNOSIS — E785 Hyperlipidemia, unspecified: Secondary | ICD-10-CM

## 2015-09-14 DIAGNOSIS — I10 Essential (primary) hypertension: Secondary | ICD-10-CM

## 2015-09-14 DIAGNOSIS — I779 Disorder of arteries and arterioles, unspecified: Secondary | ICD-10-CM

## 2015-09-14 DIAGNOSIS — I5032 Chronic diastolic (congestive) heart failure: Secondary | ICD-10-CM | POA: Diagnosis not present

## 2015-09-14 DIAGNOSIS — I739 Peripheral vascular disease, unspecified: Secondary | ICD-10-CM

## 2015-09-14 DIAGNOSIS — M7989 Other specified soft tissue disorders: Secondary | ICD-10-CM | POA: Insufficient documentation

## 2015-09-14 DIAGNOSIS — I471 Supraventricular tachycardia: Secondary | ICD-10-CM | POA: Diagnosis not present

## 2015-09-14 DIAGNOSIS — R0602 Shortness of breath: Secondary | ICD-10-CM

## 2015-09-14 DIAGNOSIS — E1159 Type 2 diabetes mellitus with other circulatory complications: Secondary | ICD-10-CM

## 2015-09-14 MED ORDER — DOXAZOSIN MESYLATE 4 MG PO TABS: 4.0000 mg | ORAL_TABLET | Freq: Two times a day (BID) | ORAL | Status: DC

## 2015-09-14 NOTE — Patient Instructions (Addendum)
Medication Instructions:   Stop the amlodipine  Start cardura/doxazosin one pill twice a day  Labwork:   No new labs  Testing/Procedures:  None needed  Follow-Up: It was a pleasure seeing you in the office today. Please call us if you have new issues that need to be addressed before your next appt.  [phone]  Your physician wants you to follow-up in: 6 months.  You will receive a reminder letter in the mail two months in advance. If you don't receive a letter, please call our office to schedule the follow-up appointment.  If you need a refill on your cardiac medications before your next appointment, please call your pharmacy.

## 2015-09-14 NOTE — Progress Notes (Signed)
Patient ID: YANCI WELLBROCK, female   DOB: [birth year], 82 y.o.   MRN: [id number] Cardiology Office Note  Date:  09/14/2015   ID:  [name], DOB [birth year], MRN [id number]  PCP:  [name], MD   Chief Complaint  Patient presents with  . other    3 month f/u c/o edema ankles/feet and fatigue. Meds reviewed verbally with pt.    HPI:  [name] is a pleasant 82 year old woman with a history of diabetes, hypertension, SVT, with hospital admission July 2016 for SVT after she developed numerous bug bites with associated pain. She presents for follow-up of her arrhythmia  In follow-up today, she presents with her daughter  Patient lives alone   she reports having Increased leg swelling, Bilaterally to below the knees  HCTZ recently by primary care held secondary to renal dysfunction   daughter reports BP [id number] to [id number] systolic pressures Weigh up 5 poundsSince her last clinic visit   she feels Tired, no regular walking  one fall , no injury, went to hospital, ankle injury on the left  Denies any significant chest pain concerning for angina. Denies having any tachycardia concerning for arrhythmia  EKG on today's visit shows normal sinus rhythm with rate 68 bpm, no significant ST or T-wave changes  Other past medical history  on her hospital admission July 2016,She reports that she had a rash at the time. Turns out she went walking in her yard, developed insect bites all of her legs and abdomen (chiger bits). She was in a tremendous amount of discomfort with severe itching. Likely secondary to that, she developed arrhythmia. she converted back to normal sinus rhythm in the hospital. At the time of discharge her metoprolol was increased from 50 up to 75 mg daily. She is felt well with no further episodes of tachycardia. She currently lives at home, independent  admitted to the hospital 09/20/2012 discharged on 09/25/2012 for Klebsiella UTI, shortness breath, tachycardia,  SVT. Arrival to the hospital, she was very weak and was unable to swallow. She was felt to be very dehydrated. Her potassium was repleted, she was started on metoprolol and Cardizem.  Echocardiogram in the hospital 09/19/2012 showed ejection fraction 0000000, diastolic dysfunction, mild LVH, moderate aortic valve stenosis, normal RV function and size  CT scan of the neck showed enlargement of the thyroid with multinodular appearance, atherosclerotic disease, right pleural effusion Lab work in the hospital 09/11/2012 showing creatinine 1.57, BUN 44 Followup blood work showed creatinine 1.07, BUN 17 on July 23  PMH:   has a past medical history of Essential hypertension; Hyperlipidemia; Diabetes mellitus without complication ([place]); Diastolic dysfunction; GERD (gastroesophageal reflux disease); Glaucoma; Depression; Gout; Hiatal hernia; Bone disease; Anemia; Cervical spine arthritis ([place]); Vitamin deficiency; Cognitive impairment; History of renal impairment; Stroke [facility]); Paroxysmal SVT (supraventricular tachycardia) ([place]); Aortic stenosis; Multinodular goiter; Carotid arterial disease ([place]); and Hypotension.  PSH:    Past Surgical History  Procedure Laterality Date  . Tubal ligation      Current Outpatient Prescriptions  Medication Sig Dispense Refill  . allopurinol (ZYLOPRIM) 300 MG tablet Take 300 mg by mouth daily.    Marland Kitchen aspirin 81 MG tablet Take 81 mg by mouth daily.    . brimonidine (ALPHAGAN P) 0.1 % SOLN Place 1 drop into the right eye 2 (two) times daily.    . cholecalciferol (VITAMIN D) 1000 UNITS tablet Take 2,000 Units by mouth daily.     . diphenhydrAMINE (BENADRYL) 25 mg capsule Take 1 capsule (  25 mg total) by mouth every 8 (eight) hours as needed for itching. 15 capsule 0  . Dorzolamide HCl-Timolol Mal (COSOPT OP) Apply to eye 2 (two) times daily.    Marland Kitchen gabapentin (NEURONTIN) 300 MG capsule Take 300 mg by mouth 2 (two) times daily.    Marland Kitchen glimepiride (AMARYL) 1 MG tablet Take 1 mg by  mouth daily with breakfast.    . iron polysaccharides (NIFEREX) 150 MG capsule Take 150 mg by mouth 2 (two) times daily.    Marland Kitchen latanoprost (XALATAN) 0.005 % ophthalmic solution Place 1 drop into the right eye at bedtime.  3  . lisinopril (PRINIVIL,ZESTRIL) 40 MG tablet TAKE 1 TABLET BY MOUTH ONCE DAILY (8 AM) (Patient taking differently: TAKE 1 TABLET BY MOUTH ONCE DAILY (pm)) 30 tablet 5  . lovastatin (MEVACOR) 40 MG tablet Take 40 mg by mouth at bedtime.    . metoprolol succinate (TOPROL-XL) 25 MG 24 hr tablet Take 3 tablets (75 mg total) by mouth daily. Take with or immediately following a meal. 120 tablet 0  . pantoprazole (PROTONIX) 40 MG tablet Take 40 mg by mouth 2 (two) times daily.    . pilocarpine (PILOCAR) 4 % ophthalmic solution Place 1 drop into the right eye 4 (four) times daily.     Marland Kitchen tiZANidine (ZANAFLEX) 4 MG tablet Take 1 tablet by mouth 3 (three) times daily as needed.  1  . doxazosin (CARDURA) 4 MG tablet Take 1 tablet (4 mg total) by mouth 2 (two) times daily. 60 tablet 11   No current facility-administered medications for this visit.     Allergies:   Metformin and related   Social History:  The patient  reports that she has never smoked. She does not have any smokeless tobacco history on file. She reports that she does not drink alcohol or use illicit drugs.   Family History:   family history includes Colitis in her father; Diabetes in her mother; Glaucoma in her mother; Peptic Ulcer in her father.    Review of Systems: Review of Systems  Constitutional: Positive for malaise/fatigue.  Respiratory: Negative.   Cardiovascular: Positive for leg swelling.  Gastrointestinal: Negative.   Musculoskeletal: Negative.   Neurological: Negative.   Psychiatric/Behavioral: Negative.   All other systems reviewed and are negative.    PHYSICAL EXAM: VS:  BP 144/84 mmHg  Pulse 68  Ht 5' (1.524 m)  Wt 157 lb 4 oz (71.328 kg)  BMI 30.71 kg/m2 , BMI Body mass index is 30.71  kg/(m^2). GEN: Well nourished, well developed, in no acute distress HEENT: normal Neck: no JVD, carotid bruits, or masses Cardiac: RRR; no murmurs, rubs, or gallops, 1+ pitting edema to below the knees bilaterally  Respiratory:  clear to auscultation bilaterally, normal work of breathing GI: soft, nontender, nondistended, + BS MS: no deformity or atrophy Skin: warm and dry, no rash Neuro:  Strength and sensation are intact Psych: euthymic mood, full affect    Recent Labs: 12/22/2014: ALT 19; BUN 19; Creatinine, Ser 1.12*; Hemoglobin 12.4; Platelets 166; Potassium 4.2; Sodium 139; TSH 1.721    Lipid Panel Lab Results  Component Value Date   CHOL 114 09/11/2014   HDL 40* 09/11/2014   LDLCALC 55 09/11/2014   TRIG 97 09/11/2014      Wt Readings from Last 3 Encounters:  09/14/15 157 lb 4 oz (71.328 kg)  07/16/15 152 lb (68.947 kg)  06/02/15 152 lb 12 oz (69.287 kg)       ASSESSMENT AND PLAN:  SVT (supraventricular tachycardia) ([place]) - Plan: EKG 12-Lead Denies any recent symptoms concerning for arrhythmia No changes to her medications  Chronic diastolic CHF (congestive heart failure) ([place]) - Plan: EKG 12-Lead HCTZ recently held, now with worsening leg swelling I suspect leg swelling is secondary to amlodipine, chronic venous insufficiency Denies any symptoms of shortness of breath Weight is up 5 pounds from prior clinic visit  Essential hypertension - Plan: EKG 12-Lead Recommended she hold amlodipine given possible side effect of leg swelling We'll try alternate blood pressure medication. Prescription sent for Cardura 4 mg twice a day Other options include clonidine, hydralazine, isosorbide  Leg swelling Likely secondary to side effect from amlodipine Symptoms worse on this medication over the past several months Likely contribute into 5 pound weight gain  Hyperlipidemia Recommend she continue on her lovastatin daily  Bilateral carotid artery disease (HCC) Mild  bilateral carotid disease on ultrasound July 2016 Patent vertebral arteries  Type 2 diabetes mellitus with other circulatory complication ([place]) We have encouraged continued exercise, careful diet management in an effort to lose weight.  SOB (shortness of breath) Stable symptoms, does not seem to be fluid overloaded   Total encounter time more than 25 minutes  Greater than 50% was spent in counseling and coordination of care with the patient   Disposition:   F/U  6 months   Orders Placed This Encounter  Procedures  . EKG 12-Lead     Signed, Esmond Plants, M.D., Ph.D. 09/14/2015  [place], [place]

## 2015-10-06 ENCOUNTER — Other Ambulatory Visit: Payer: Self-pay | Admitting: Internal Medicine

## 2015-10-06 DIAGNOSIS — Z1231 Encounter for screening mammogram for malignant neoplasm of breast: Secondary | ICD-10-CM

## 2015-10-08 ENCOUNTER — Telehealth: Payer: Self-pay | Admitting: Cardiovascular Disease

## 2015-10-08 NOTE — Telephone Encounter (Signed)
Pt daughter calling stating she's been in and out of town She has some recordings on patient bp  Pt c/o BP issue: STAT if pt c/o blurred vision, one-sided weakness or slurred speech  1. What are your last 5 BP readings?  10/08/15 143/86 HR 64 10/02/15  161/92 HR 61 09/30/15 129/72 HR 68 09/29/15 135/76 HR 63 09/28/15 141/77 HR 65   2. Are you having any other symptoms (ex. Dizziness, headache, blurred vision, passed out)?  No but swelling has gone down.   3. What is your BP issue? Just higher than normal. Just would like some advise  Please advise

## 2015-10-08 NOTE — Telephone Encounter (Signed)
Patients daughter called for [place] to review recent blood pressure readings. Instructed her that normal blood pressure readings should be between 120/80 to 140/90. She states that blood pressures are taken at random times throughout the day. Suggested that she try to take them the same time every day and keep a log. Instructed her on how taking at different times may cause higher readings based on what she has been doing. She reported that her mother did not have any swelling at this time and no other complaints. Let her know to continue monitoring and call back if readings remain elevated greater than 140/90 and we will see if we need to make any adjustments at that time. She verbalized understanding of our conversation, agreement with plan of care, and had no further questions at this time.

## 2015-10-14 ENCOUNTER — Other Ambulatory Visit: Payer: Self-pay | Admitting: Cardiovascular Disease

## 2015-10-15 ENCOUNTER — Other Ambulatory Visit: Payer: Self-pay | Admitting: Internal Medicine

## 2015-10-15 ENCOUNTER — Ambulatory Visit: Payer: Self-pay

## 2015-10-15 DIAGNOSIS — M8580 Other specified disorders of bone density and structure, unspecified site: Secondary | ICD-10-CM

## 2015-10-21 ENCOUNTER — Other Ambulatory Visit: Payer: Self-pay | Admitting: Cardiovascular Disease

## 2015-10-21 ENCOUNTER — Other Ambulatory Visit: Payer: Self-pay

## 2015-10-21 MED ORDER — LISINOPRIL 40 MG PO TABS: 40.0000 mg | ORAL_TABLET | Freq: Every day | ORAL | 5 refills | Status: DC

## 2015-10-29 ENCOUNTER — Ambulatory Visit
Admission: RE | Admit: 2015-10-29 | Discharge: 2015-10-29 | Disposition: A | Discharge status: Home / Self Care | Payer: Medicare HMO | Source: Ambulatory Visit | Attending: Internal Medicine | Admitting: Internal Medicine

## 2015-10-29 DIAGNOSIS — M8580 Other specified disorders of bone density and structure, unspecified site: Secondary | ICD-10-CM

## 2015-10-29 DIAGNOSIS — Z1231 Encounter for screening mammogram for malignant neoplasm of breast: Secondary | ICD-10-CM

## 2015-10-29 DIAGNOSIS — M810 Age-related osteoporosis without current pathological fracture: Secondary | ICD-10-CM | POA: Insufficient documentation

## 2015-12-14 ENCOUNTER — Telehealth: Payer: Self-pay | Admitting: Cardiovascular Disease

## 2015-12-14 DIAGNOSIS — R5383 Other fatigue: Secondary | ICD-10-CM | POA: Insufficient documentation

## 2015-12-14 DIAGNOSIS — R350 Frequency of micturition: Secondary | ICD-10-CM | POA: Insufficient documentation

## 2015-12-14 NOTE — Telephone Encounter (Signed)
Pt daughter called, states pt saw PCP today, and recommend she call Dr. Rockey Situ regarding elevated BP and swelling in both feet. BP readings: Today, not sure of "top #, bottom # was 100" 10/13-151/90 9/29-130/82  Pt c/o swelling: STAT is pt has developed SOB within 24 hours  1. How long have you been experiencing swelling? About a month   2. Where is the swelling located? Both feet  3.  Are you currently taking a "fluid pill"> no, PCP took her off of it.    4.  Are you currently SOB? No, just very tired  5.  Have you traveled recently? Beginning of September, traveled to the beach.

## 2015-12-14 NOTE — Telephone Encounter (Signed)
Spoke w/ pt's daughter.   She reports that pt has c/o increased fatigue & urination.  Pt's BP was elevated at PCP's office today. She was prescribed a patch, but she is unsure what this med is.  They recommend she see Dr. Rockey Situ in 2-3 weeks.  Pt sched for 11/1 @ 4:00; placed on wait list to call in the event of a cancellation before that time.

## 2015-12-31 ENCOUNTER — Ambulatory Visit (INDEPENDENT_AMBULATORY_CARE_PROVIDER_SITE_OTHER): Payer: Medicare HMO | Admitting: Cardiovascular Disease

## 2015-12-31 ENCOUNTER — Encounter: Payer: Self-pay | Admitting: Cardiovascular Disease

## 2015-12-31 VITALS — BP 160/86 | HR 68 | Ht 60.0 in | Wt 160.4 lb

## 2015-12-31 DIAGNOSIS — M7989 Other specified soft tissue disorders: Secondary | ICD-10-CM

## 2015-12-31 DIAGNOSIS — I5032 Chronic diastolic (congestive) heart failure: Secondary | ICD-10-CM | POA: Diagnosis not present

## 2015-12-31 DIAGNOSIS — I779 Disorder of arteries and arterioles, unspecified: Secondary | ICD-10-CM

## 2015-12-31 DIAGNOSIS — E7800 Pure hypercholesterolemia, unspecified: Secondary | ICD-10-CM | POA: Diagnosis not present

## 2015-12-31 DIAGNOSIS — I471 Supraventricular tachycardia: Secondary | ICD-10-CM

## 2015-12-31 DIAGNOSIS — E1159 Type 2 diabetes mellitus with other circulatory complications: Secondary | ICD-10-CM

## 2015-12-31 DIAGNOSIS — I10 Essential (primary) hypertension: Secondary | ICD-10-CM

## 2015-12-31 DIAGNOSIS — I739 Peripheral vascular disease, unspecified: Secondary | ICD-10-CM

## 2015-12-31 MED ORDER — ISOSORBIDE MONONITRATE ER 60 MG PO TB24: 60.0000 mg | ORAL_TABLET | Freq: Two times a day (BID) | ORAL | 6 refills | Status: DC

## 2015-12-31 NOTE — Patient Instructions (Addendum)
Medication Instructions:   Please stop the nitro patch Please start imdur/isosorbide once a day  Please monitor blood pressure, Call the office with blood pressure numbers  If blood pressure runs very high, Increase the pill up to twice a day  Labwork:  No new labs needed  Testing/Procedures:  No further testing at this time   Follow-Up: It was a pleasure seeing you in the office today. Please call us if you have new issues that need to be addressed before your next appt.  [phone]  Your physician wants you to follow-up in: 6 months.  You will receive a reminder letter in the mail two months in advance. If you don't receive a letter, please call our office to schedule the follow-up appointment.  If you need a refill on your cardiac medications before your next appointment, please call your pharmacy.

## 2015-12-31 NOTE — Progress Notes (Signed)
Cardiology Office Note  Date:  12/31/2015   ID:  [name], DOB [birth year], MRN [id number]  PCP:  [name], MD   Chief Complaint  Patient presents with  . Hypertension    HPI:  [name] is a pleasant 82 year old woman with a history of diabetes, hypertension, SVT, with hospital admission July 2016 for SVT after she developed numerous bug bites with associated pain. She presents for follow-up of her arrhythmia, hypertension and lower extremity swelling  In follow-up today, she presents with her daughter  Patient lives alone , daughter lives next door On her last clinic visit she had significant leg swelling Recommended she hold the amlodipine  In follow-up today, edema is dramatically improved Weight has not changed very much HCTZ recently held  by primary care held secondary to renal dysfunction   Blood pressure has been running high despite start of nitroglycerin patch  381O systolic  Blood pressure elevated on today's visit, 160s   she feels Tired, no regular walking  no recent falls   Denies any significant chest pain concerning for angina. Denies having any tachycardia concerning for arrhythmia  EKG on today's visit shows normal sinus rhythm with rate 67 bpm, no significant ST or T-wave changes  Other past medical history  on her hospital admission July 2016,She reports that she had a rash at the time. Turns out she went walking in her yard, developed insect bites all of her legs and abdomen (chiger bits). She was in a tremendous amount of discomfort with severe itching. Likely secondary to that, she developed arrhythmia. she converted back to normal sinus rhythm in the hospital. At the time of discharge her metoprolol was increased from 50 up to 75 mg daily. She is felt well with no further episodes of tachycardia. She currently lives at home, independent  admitted to the hospital 09/20/2012 discharged on 09/25/2012 for Klebsiella UTI,  shortness breath, tachycardia, SVT. Arrival to the hospital, she was very weak and was unable to swallow. She was felt to be very dehydrated. Her potassium was repleted, she was started on metoprolol and Cardizem.  Echocardiogram in the hospital 09/19/2012 showed ejection fraction 17-51%, diastolic dysfunction, mild LVH, moderate aortic valve stenosis, normal RV function and size  CT scan of the neck showed enlargement of the thyroid with multinodular appearance, atherosclerotic disease, right pleural effusion Lab work in the hospital 09/11/2012 showing creatinine 1.57, BUN 44 Followup blood work showed creatinine 1.07, BUN 17 on July 23  PMH:   has a past medical history of Anemia; Aortic stenosis; Bone disease; Carotid arterial disease ([place]); Cervical spine arthritis ([place]); Cognitive impairment; Depression; Diabetes mellitus without complication ([place]); Diastolic dysfunction; Essential hypertension; GERD (gastroesophageal reflux disease); Glaucoma; Gout; Hiatal hernia; History of renal impairment; Hyperlipidemia; Hypotension; Multinodular goiter; Paroxysmal SVT (supraventricular tachycardia) ([place]); Stroke [facility]); and Vitamin deficiency.  PSH:    Past Surgical History:  Procedure Laterality Date  . TUBAL LIGATION      Current Outpatient Prescriptions  Medication Sig Dispense Refill  . allopurinol (ZYLOPRIM) 300 MG tablet Take 300 mg by mouth daily.    Marland Kitchen aspirin 81 MG tablet Take 81 mg by mouth daily.    . brimonidine (ALPHAGAN P) 0.1 % SOLN Place 1 drop into the right eye 2 (two) times daily.    . cholecalciferol (VITAMIN D) 1000 UNITS tablet Take 2,000 Units by mouth daily.     . diphenhydrAMINE (BENADRYL) 25 mg capsule Take 1 capsule (25 mg total) by mouth every 8 (eight)  hours as needed for itching. 15 capsule 0  . Dorzolamide HCl-Timolol Mal (COSOPT OP) Apply to eye 2 (two) times daily.    Marland Kitchen doxazosin (CARDURA) 4 MG tablet Take 1 tablet (4 mg total) by mouth 2 (two) times daily. 60  tablet 11  . gabapentin (NEURONTIN) 300 MG capsule Take 300 mg by mouth 2 (two) times daily.    Marland Kitchen glimepiride (AMARYL) 1 MG tablet Take 1 mg by mouth daily with breakfast.    . iron polysaccharides (NIFEREX) 150 MG capsule Take 150 mg by mouth 2 (two) times daily.    Marland Kitchen latanoprost (XALATAN) 0.005 % ophthalmic solution Place 1 drop into the right eye at bedtime.  3  . lisinopril (PRINIVIL,ZESTRIL) 40 MG tablet Take 1 tablet (40 mg total) by mouth daily. At 8am 30 tablet 5  . lovastatin (MEVACOR) 40 MG tablet Take 40 mg by mouth at bedtime.    . metoprolol succinate (TOPROL-XL) 25 MG 24 hr tablet Take 3 tablets (75 mg total) by mouth daily. Take with or immediately following a meal. 120 tablet 0  . pantoprazole (PROTONIX) 40 MG tablet Take 40 mg by mouth 2 (two) times daily.    . pilocarpine (PILOCAR) 4 % ophthalmic solution Place 1 drop into the right eye 4 (four) times daily.     Marland Kitchen tiZANidine (ZANAFLEX) 4 MG tablet Take 1 tablet by mouth 3 (three) times daily as needed.  1  . isosorbide mononitrate (IMDUR) 60 MG 24 hr tablet Take 1 tablet (60 mg total) by mouth 2 (two) times daily. 60 tablet 6   No current facility-administered medications for this visit.      Allergies:   Metformin and related   Social History:  The patient  reports that she has never smoked. She has never used smokeless tobacco. She reports that she does not drink alcohol or use drugs.   Family History:   family history includes Colitis in her father; Diabetes in her mother; Glaucoma in her mother; Peptic Ulcer in her father.    Review of Systems: Review of Systems  Constitutional: Positive for malaise/fatigue.  Respiratory: Negative.   Cardiovascular: Negative.   Gastrointestinal: Negative.   Musculoskeletal: Negative.   Neurological: Positive for weakness.  Psychiatric/Behavioral: Negative.   All other systems reviewed and are negative.    PHYSICAL EXAM: VS:  BP (!) 160/86 (BP Location: Right Arm, Patient  Position: Sitting, Cuff Size: Normal)   Pulse 68   Ht 5' (1.524 m)   Wt 160 lb 6.4 oz (72.8 kg)   SpO2 97%   BMI 31.33 kg/m  , BMI Body mass index is 31.33 kg/m. GEN: Well nourished, well developed, in no acute distress, obese  HEENT: normal  Neck: no JVD, carotid bruits, or masses Cardiac: RRR; no murmurs, rubs, or gallops,no edema  Respiratory:  clear to auscultation bilaterally, normal work of breathing GI: soft, nontender, nondistended, + BS MS: no deformity or atrophy  Skin: warm and dry, no rash Neuro:  Strength and sensation are intact Psych: euthymic mood, full affect    Recent Labs: No results found for requested labs within last 8760 hours.    Lipid Panel Lab Results  Component Value Date   CHOL 114 09/11/2014   HDL 40 (L) 09/11/2014   LDLCALC 55 09/11/2014   TRIG 97 09/11/2014      Wt Readings from Last 3 Encounters:  12/31/15 160 lb 6.4 oz (72.8 kg)  09/14/15 157 lb 4 oz (71.3 kg)  07/16/15 152  lb (68.9 kg)       ASSESSMENT AND PLAN:  Essential hypertension - Plan: EKG 12-Lead Recommended she hold the Nitropatch For convenience would start isosorbide mononitrate 60 mg daily If blood pressure continues to run high, she could take the Imdur 60 mg twice a day  SVT (supraventricular tachycardia) (HCC) Denies any symptoms concerning for arrhythmia  Chronic diastolic CHF (congestive heart failure) ([place]) Not on diuretic at this time Appears relatively euvolemic  Pure hypercholesterolemia Cholesterol is at goal on the current lipid regimen. No changes to the medications were made.  Bilateral carotid artery disease (HCC)  Type 2 diabetes mellitus with other circulatory complication, unspecified long term insulin use status ([place]) We have encouraged continued exercise, careful diet management in an effort to lose weight. Currently very sedentary Daughter trying to restrict her diet  Leg swelling Improved by holding amlodipine We'll try to avoid  calcium channel blockers   Total encounter time more than 25 minutes  Greater than 50% was spent in counseling and coordination of care with the patient   Disposition:   F/U  6 months   Orders Placed This Encounter  Procedures  . EKG 12-Lead     Signed, Esmond Plants, M.D., Ph.D. 12/31/2015  [place], [place]

## 2016-01-11 ENCOUNTER — Telehealth: Payer: Self-pay | Admitting: Cardiovascular Disease

## 2016-01-11 NOTE — Telephone Encounter (Signed)
Pt was advised at ov 12/31/15:  "Please stop the nitro patch Please start imdur/isosorbide once a day  Please monitor blood pressure, Call the office with blood pressure numbers  If blood pressure runs very high, Increase the pill up to twice a day"  She has increased isosorbide to 60 mg BID, but pressures are still running high.  She would like to know how to proceed.

## 2016-01-11 NOTE — Telephone Encounter (Signed)
[place] patient Daughter calling to give pt BP readings  They would like to let us know also they have started to take the full dose of Isosorbide 60 mg 2 times a day  Was doing half of it but now are doing the whole thing.  Started this about last week and BP is still high.  01/11/16 : 152 pm  174/89 HR 71 01/10/16 : 1055 am 165/89 HR 67 01/09/16 : 859 am  170/93 HR 72 01/08/16 : 1146 am 168/89 HR 68 01/07/16 : 609 PM 122/66 HR 69 (This is the day when she started to take the full dose                   334 pm 151/80                   9 am 162/96 HR 77                   757 am 199/109 HR 74   It seemed like it went down but then she checked it it she got the 122/66 HR 69  It would go down and then jump back up.  Would like some advise on this  Has had some lightheadedness when it gets really high as well.   Please call back

## 2016-01-12 NOTE — Telephone Encounter (Signed)
Pt Daughter calling wanting to leave this mornings readings  01/12/16 : 933 am 163/94 HR 67   Please advise.

## 2016-01-13 NOTE — Telephone Encounter (Signed)
Would start hydralazine 1/2 pill of the 50 mg tab TID for one week If BP continues to run high, Would increase up to a full pill TID

## 2016-01-13 NOTE — Telephone Encounter (Signed)
Patients daughter calling with blood pressure numbers. They increased Imdur to twice daily and her blood pressures remain elevated. She wanted to know how to proceed and if we need to make any additional medication changes. Let her know that I would forward to Dr. Rockey Situ for his review and we will be in touch.  This AM prior to medications was 184/104.  Yesterday 09:33 AM 163/94 4:28 PM 169/94  01/10/16  10:55 AM 155/89  01/09/16 08:59AM 170/93  01/08/16 11:46 AM 168/89  01/07/16 07:57 AM 191/109 Lightheaded 09:07 AM after medications 162/93 3:34 pm 151/80 6:09 PM 122/66

## 2016-01-13 NOTE — Telephone Encounter (Signed)
Pt daughter calling back, states this morning 8:21 BP was 184/104, before medications, yesterday at 4:28 pm 169/94. Please call.

## 2016-01-14 ENCOUNTER — Other Ambulatory Visit: Payer: Self-pay | Admitting: Cardiovascular Disease

## 2016-01-14 MED ORDER — HYDRALAZINE HCL 50 MG PO TABS: 50.0000 mg | ORAL_TABLET | Freq: Three times a day (TID) | ORAL | 3 refills | Status: DC

## 2016-01-14 NOTE — Telephone Encounter (Signed)
Spoke w/ [name].  Advised her of Dr. Donivan Scull recommendation. She is agreeable and will call back if readings do not improve.

## 2016-01-23 ENCOUNTER — Emergency Department
Admission: EM | Admit: 2016-01-23 | Discharge: 2016-01-23 | Disposition: A | Discharge status: Home / Self Care | Payer: Medicare HMO | Attending: Emergency Medicine | Admitting: Emergency Medicine

## 2016-01-23 ENCOUNTER — Emergency Department: Payer: Medicare HMO

## 2016-01-23 ENCOUNTER — Encounter: Payer: Self-pay | Admitting: Emergency Medicine

## 2016-01-23 DIAGNOSIS — J0100 Acute maxillary sinusitis, unspecified: Secondary | ICD-10-CM | POA: Diagnosis not present

## 2016-01-23 DIAGNOSIS — E119 Type 2 diabetes mellitus without complications: Secondary | ICD-10-CM | POA: Insufficient documentation

## 2016-01-23 DIAGNOSIS — Z7984 Long term (current) use of oral hypoglycemic drugs: Secondary | ICD-10-CM | POA: Diagnosis not present

## 2016-01-23 DIAGNOSIS — Z7982 Long term (current) use of aspirin: Secondary | ICD-10-CM | POA: Insufficient documentation

## 2016-01-23 DIAGNOSIS — I110 Hypertensive heart disease with heart failure: Secondary | ICD-10-CM | POA: Insufficient documentation

## 2016-01-23 DIAGNOSIS — R0981 Nasal congestion: Secondary | ICD-10-CM | POA: Diagnosis present

## 2016-01-23 DIAGNOSIS — I5032 Chronic diastolic (congestive) heart failure: Secondary | ICD-10-CM | POA: Insufficient documentation

## 2016-01-23 LAB — BASIC METABOLIC PANEL
Anion gap: 7 (ref 5–15)
BUN: 15 mg/dL (ref 6–20)
CALCIUM: 9.4 mg/dL (ref 8.9–10.3)
CO2: 25 mmol/L (ref 22–32)
CREATININE: 0.94 mg/dL (ref 0.44–1.00)
Chloride: 108 mmol/L (ref 101–111)
GFR calc non Af Amer: 52 mL/min — ABNORMAL LOW (ref >60)
Glucose, Bld: 146 mg/dL — ABNORMAL HIGH (ref 65–99)
Potassium: 3.9 mmol/L (ref 3.5–5.1)
SODIUM: 140 mmol/L (ref 135–145)

## 2016-01-23 LAB — CBC
HCT: 36.8 % (ref 35.0–47.0)
Hemoglobin: 12.4 g/dL (ref 12.0–16.0)
MCH: 32 pg (ref 26.0–34.0)
MCHC: 33.7 g/dL (ref 32.0–36.0)
MCV: 95 fL (ref 80.0–100.0)
PLATELETS: 173 10*3/uL (ref 150–440)
RBC: 3.87 MIL/uL (ref 3.80–5.20)
RDW: 15.2 % — ABNORMAL HIGH (ref 11.5–14.5)
WBC: 7.5 10*3/uL (ref 3.6–11.0)

## 2016-01-23 MED ORDER — AMOXICILLIN-POT CLAVULANATE 875-125 MG PO TABS: 1.0000 | ORAL_TABLET | Freq: Two times a day (BID) | ORAL | 0 refills | Status: AC

## 2016-01-23 NOTE — ED Notes (Signed)

## 2016-01-23 NOTE — ED Triage Notes (Signed)
Brought in by family   States she has had a "head cold  Congestion" for the past few days   Increased weakness   Family also noted some facial swelling

## 2016-01-23 NOTE — ED Provider Notes (Signed)
[facility] Emergency Department Provider Note   ____________________________________________    I have reviewed the triage vital signs and the nursing notes.   HISTORY  Chief Complaint Nasal Congestion and URI     HPI [name] is a 82 y.o. female who presents with complaints of "a head cold". Patient reports she has pressure in her sinuses bilaterally for the last 2 days. She has also felt fatigued. She denies fevers or chills. She denies headache and states the pressure is only around her nose. No focal deficits. No cough.   Past Medical History:  Diagnosis Date  . Anemia   . Aortic stenosis    a. echo 08/2014: EF 55-60%, no RWMA, GR1DD, severe AS, mild AI, Mean gradient (S): 38 mm Hg. Valve area (VTI): 0.78 cm^2., mild-mod MR, LA mildly dilated, atrial septal aneurysm, mild TR  . Bone disease   . Carotid arterial disease ([place])    a. 08/2014 U/S: Bilateral < 50% stneosis. Patent vertebrals w/ antegrade flow.   . Cervical spine arthritis ([place])   . Cognitive impairment   . Depression   . Diabetes mellitus without complication ([place])   . Diastolic dysfunction    a. echo 2014: EF 55-60%, DD, mild LVH, moderate AS;  b. 08/2014 Echo: EF 55-60%, no RWMA, GR1DD, severe AS, mild AI, Mean gradient (S): 38 mm Hg. Valve area (VTI): 0.78 cm^2., mild-mod MR, LA mildly dilated, atrial septal aneurysm, mild TR  . Essential hypertension   . GERD (gastroesophageal reflux disease)   . Glaucoma   . Gout   . Hiatal hernia   . History of renal impairment   . Hyperlipidemia   . Hypotension    a. Related to Norvasc - 11/2014 ED visit.  . Multinodular goiter    a. Noted incidentally on CT 07/2012 and carotid U/S 08/2014;  b. Nl TSH 11/2014.  Marland Kitchen Paroxysmal SVT (supraventricular tachycardia) (HCC)    a. on Toprol   . Stroke [facility])    a. CT head 07/2014 showed small thalamic infarct  . Vitamin deficiency     Patient Active Problem List   Diagnosis Date Noted  .  Leg swelling 09/14/2015  . Essential hypertension   . Hyperlipidemia   . Diabetes mellitus without complication ([place])   . Paroxysmal SVT (supraventricular tachycardia) ([place])   . Multinodular goiter   . Carotid arterial disease ([place])   . Hypotension   . Aortic stenosis   . NSTEMI (non-ST elevated myocardial infarction) ([place]) 09/10/2014  . Renal failure 08/28/2014  . SVT (supraventricular tachycardia) ([place]) 12/17/2012  . Diabetes mellitus, type 2 ([place]) 12/17/2012  . SOB (shortness of breath) 12/17/2012  . Essential hypertension, malignant 12/17/2012  . Chronic diastolic CHF (congestive heart failure) ([place]) 12/17/2012    Past Surgical History:  Procedure Laterality Date  . TUBAL LIGATION      Prior to Admission medications   Medication Sig Start Date End Date Taking? Authorizing Provider  allopurinol (ZYLOPRIM) 300 MG tablet Take 300 mg by mouth daily.    Historical Provider, MD  amoxicillin-clavulanate (AUGMENTIN) 875-125 MG tablet Take 1 tablet by mouth 2 (two) times daily. 01/23/16 01/30/16  Lavonia Drafts, MD  aspirin 81 MG tablet Take 81 mg by mouth daily.    Historical Provider, MD  brimonidine (ALPHAGAN P) 0.1 % SOLN Place 1 drop into the right eye 2 (two) times daily.    Historical Provider, MD  cholecalciferol (VITAMIN D) 1000 UNITS tablet Take 2,000 Units by  mouth daily.     Historical Provider, MD  diphenhydrAMINE (BENADRYL) 25 mg capsule Take 1 capsule (25 mg total) by mouth every 8 (eight) hours as needed for itching. 09/12/14   Vaughan Basta, MD  Dorzolamide HCl-Timolol Mal (COSOPT OP) Apply to eye 2 (two) times daily.    Historical Provider, MD  doxazosin (CARDURA) 4 MG tablet Take 1 tablet (4 mg total) by mouth 2 (two) times daily. 09/14/15   Minna Merritts, MD  gabapentin (NEURONTIN) 300 MG capsule Take 300 mg by mouth 2 (two) times daily.    Historical Provider, MD  glimepiride (AMARYL) 1 MG tablet Take 1 mg by mouth daily with breakfast.    Historical Provider,  MD  hydrALAZINE (APRESOLINE) 50 MG tablet Take 1 tablet (50 mg total) by mouth 3 (three) times daily. 01/14/16 04/13/16  Minna Merritts, MD  iron polysaccharides (NIFEREX) 150 MG capsule Take 150 mg by mouth 2 (two) times daily.    Historical Provider, MD  isosorbide mononitrate (IMDUR) 60 MG 24 hr tablet Take 1 tablet (60 mg total) by mouth 2 (two) times daily. 12/31/15 12/30/16  Minna Merritts, MD  latanoprost (XALATAN) 0.005 % ophthalmic solution Place 1 drop into the right eye at bedtime. 07/03/14   Historical Provider, MD  lisinopril (PRINIVIL,ZESTRIL) 40 MG tablet Take 1 tablet (40 mg total) by mouth daily. At 8am 10/21/15 01/19/16  Minna Merritts, MD  lisinopril (PRINIVIL,ZESTRIL) 40 MG tablet TAKE 1 TABLET BY MOUTH ONCE DAILY (8 AM) 01/14/16   Minna Merritts, MD  lovastatin (MEVACOR) 40 MG tablet Take 40 mg by mouth at bedtime.    Historical Provider, MD  metoprolol succinate (TOPROL-XL) 25 MG 24 hr tablet Take 3 tablets (75 mg total) by mouth daily. Take with or immediately following a meal. 09/12/14   Vaughan Basta, MD  pantoprazole (PROTONIX) 40 MG tablet Take 40 mg by mouth 2 (two) times daily.    Historical Provider, MD  pilocarpine (PILOCAR) 4 % ophthalmic solution Place 1 drop into the right eye 4 (four) times daily.     Historical Provider, MD  tiZANidine (ZANAFLEX) 4 MG tablet Take 1 tablet by mouth 3 (three) times daily as needed. 08/08/14   Historical Provider, MD     Allergies Metformin and related  Family History  Problem Relation Age of Onset  . Glaucoma Mother   . Diabetes Mother   . Peptic Ulcer Father   . Colitis Father     Social History Social History  Substance Use Topics  . Smoking status: Never Smoker  . Smokeless tobacco: Never Used  . Alcohol use No    Review of Systems  Constitutional: No fever/chills Eyes: No visual changes.  ENT: No sore throat.Sinus pressure as above Cardiovascular: Denies chest pain. Respiratory: Denies shortness of  breath. Gastrointestinal: No nausea, no vomiting.   Genitourinary: Negative for dysuria. Musculoskeletal: Negative for back pain.  Neurological: Negative for headaches   10-point ROS otherwise negative.  ____________________________________________   PHYSICAL EXAM:  VITAL SIGNS: ED Triage Vitals [01/23/16 1230]  Enc Vitals Group     BP (!) 158/77     Pulse Rate 75     Resp 20     Temp 99 F (37.2 C)     Temp Source Oral     SpO2 98 %     Weight 160 lb (72.6 kg)     Height 5\' 4"  (1.626 m)     Head Circumference  Peak Flow      Pain Score      Pain Loc      Pain Edu?      Excl. in [place]?     Constitutional: Alert and oriented. No acute distress. Pleasant and interactive Eyes: Conjunctivae are normal.   Nose: Positive congestion, sinus tenderness bilaterally Mouth/Throat: Mucous membranes are moist.    Cardiovascular: Normal rate, regular rhythm. Grossly normal heart sounds.  Good peripheral circulation. Respiratory: Normal respiratory effort.  No retractions. Lungs CTAB. Gastrointestinal: Soft and nontender. No distention.  No CVA tenderness. Genitourinary: deferred Musculoskeletal: No lower extremity tenderness nor edema.  Warm and well perfused Neurologic:  Normal speech and language. No gross focal neurologic deficits are appreciated.  Skin:  Skin is warm, dry and intact. No rash noted. Psychiatric: Mood and affect are normal. Speech and behavior are normal.  ____________________________________________   LABS (all labs ordered are listed, but only abnormal results are displayed)  Labs Reviewed  BASIC METABOLIC PANEL - Abnormal; Notable for the following:       Result Value   Glucose, Bld 146 (*)    GFR calc non Af Amer 52 (*)    All other components within normal limits  CBC - Abnormal; Notable for the following:    RDW 15.2 (*)    All other components within normal limits  URINALYSIS COMPLETEWITH MICROSCOPIC ([facility] ONLY)    ____________________________________________  EKG  ED ECG REPORT I, Lavonia Drafts, the attending physician, personally viewed and interpreted this ECG.  Date: 01/23/2016 EKG Time: 12:35 PM Rate: 74 Rhythm: normal sinus rhythm QRS Axis: Left axis deviation Intervals: normal ST/T Wave abnormalities: Nonspecific Conduction Disturbances: none   ____________________________________________  RADIOLOGY  None ____________________________________________   PROCEDURES  Procedure(s) performed: No    Critical Care performed: No ____________________________________________   INITIAL IMPRESSION / ASSESSMENT AND PLAN / ED COURSE  Pertinent labs & imaging results that were available during my care of the patient were reviewed by me and considered in my medical decision making (see chart for details).  Patient is well-appearing and in no distress. Vital signs are unremarkable. History of present illness and exam is most consistent with sinus congestion. Discussed with her the possibility of supportive care but she would like to try antibiotics despite knowing they can cause nausea and diarrhea. Recommend follow-up with PCP or return to the ED if any change or worsening in symptoms  Clinical Course    ____________________________________________   FINAL CLINICAL IMPRESSION(S) / ED DIAGNOSES  Final diagnoses:  Acute non-recurrent maxillary sinusitis      NEW MEDICATIONS STARTED DURING THIS VISIT:  New Prescriptions   AMOXICILLIN-CLAVULANATE (AUGMENTIN) 875-125 MG TABLET    Take 1 tablet by mouth 2 (two) times daily.     Note:  This document was prepared using Dragon voice recognition software and may include unintentional dictation errors.    Lavonia Drafts, MD 01/23/16 [id number]

## 2016-01-27 ENCOUNTER — Encounter: Payer: Self-pay | Admitting: *Deleted

## 2016-01-27 ENCOUNTER — Emergency Department
Admission: EM | Admit: 2016-01-27 | Discharge: 2016-01-27 | Disposition: A | Discharge status: Home / Self Care | Payer: Medicare HMO | Attending: Emergency Medicine | Admitting: Emergency Medicine

## 2016-01-27 DIAGNOSIS — J069 Acute upper respiratory infection, unspecified: Secondary | ICD-10-CM

## 2016-01-27 DIAGNOSIS — I5032 Chronic diastolic (congestive) heart failure: Secondary | ICD-10-CM | POA: Diagnosis not present

## 2016-01-27 DIAGNOSIS — E1165 Type 2 diabetes mellitus with hyperglycemia: Secondary | ICD-10-CM | POA: Diagnosis not present

## 2016-01-27 DIAGNOSIS — I110 Hypertensive heart disease with heart failure: Secondary | ICD-10-CM | POA: Diagnosis not present

## 2016-01-27 DIAGNOSIS — Z79899 Other long term (current) drug therapy: Secondary | ICD-10-CM | POA: Insufficient documentation

## 2016-01-27 DIAGNOSIS — R739 Hyperglycemia, unspecified: Secondary | ICD-10-CM

## 2016-01-27 DIAGNOSIS — Z7984 Long term (current) use of oral hypoglycemic drugs: Secondary | ICD-10-CM | POA: Insufficient documentation

## 2016-01-27 DIAGNOSIS — Z7982 Long term (current) use of aspirin: Secondary | ICD-10-CM | POA: Insufficient documentation

## 2016-01-27 DIAGNOSIS — R531 Weakness: Secondary | ICD-10-CM

## 2016-01-27 HISTORY — DX: Uterovaginal prolapse, unspecified: N81.4

## 2016-01-27 LAB — BASIC METABOLIC PANEL
Anion gap: 7 (ref 5–15)
BUN: 16 mg/dL (ref 6–20)
CALCIUM: 9.3 mg/dL (ref 8.9–10.3)
CO2: 24 mmol/L (ref 22–32)
CREATININE: 0.95 mg/dL (ref 0.44–1.00)
Chloride: 105 mmol/L (ref 101–111)
GFR calc Af Amer: 60 mL/min — ABNORMAL LOW (ref >60)
GFR calc non Af Amer: 52 mL/min — ABNORMAL LOW (ref >60)
GLUCOSE: 229 mg/dL — AB (ref 65–99)
Potassium: 4.2 mmol/L (ref 3.5–5.1)
Sodium: 136 mmol/L (ref 135–145)

## 2016-01-27 LAB — URINALYSIS COMPLETE WITH MICROSCOPIC (ARMC ONLY)
Bacteria, UA: NONE SEEN
Bilirubin Urine: NEGATIVE
Glucose, UA: 50 mg/dL — AB
Hgb urine dipstick: NEGATIVE
Ketones, ur: NEGATIVE mg/dL
Nitrite: NEGATIVE
Protein, ur: NEGATIVE mg/dL
Specific Gravity, Urine: 1.013 (ref 1.005–1.030)
pH: 7 (ref 5.0–8.0)

## 2016-01-27 LAB — CBC
HCT: 35.6 % (ref 35.0–47.0)
Hemoglobin: 12.1 g/dL (ref 12.0–16.0)
MCH: 32.2 pg (ref 26.0–34.0)
MCHC: 34 g/dL (ref 32.0–36.0)
MCV: 94.7 fL (ref 80.0–100.0)
Platelets: 162 K/uL (ref 150–440)
RBC: 3.76 MIL/uL — ABNORMAL LOW (ref 3.80–5.20)
RDW: 15.2 % — ABNORMAL HIGH (ref 11.5–14.5)
WBC: 6.3 K/uL (ref 3.6–11.0)

## 2016-01-27 NOTE — Discharge Instructions (Signed)
You were evaluated for generalized weakness and found to have slightly elevated blood sugar, and as we discussed I suspect you may be having a viral upper respiratory infection and its leading to some mildly high blood sugars.  Your examine evaluation are reassuring in the emergency department today. Please return to the emergency room for any worsening condition including one-sided weakness or numbness, confusion or altered mental status, chest pain, abdominal pain, fever, or any other symptoms concerning to you.

## 2016-01-27 NOTE — ED Triage Notes (Signed)
States feeling weak for 1 week, states she was started on abx Saturday for a sinus infection, states increased BM, states decrease in appetite, pt awake and alert, denies any pain, states continued nasal congestion

## 2016-01-27 NOTE — ED Provider Notes (Signed)
[facility] Emergency Department Provider Note ____________________________________________   I have reviewed the triage vital signs and the triage nursing note.  HISTORY  Chief Complaint Weakness   Historian Patient and daughter  HPI [name] is a 82 y.o. female who lives at home alone, who is brought in by her family out of concern that she is still feeling under the weather and generalized weakness after upper respiratory symptoms for which she was treated for possible sinus infection. Her blood pressures have been running slightly high. No fever. No productive cough. No nausea or vomiting. No diarrhea. No abdominal pain.  Patient states that she just does not have her normal amount of energy.  Symptoms are mild to moderate. Denies urinary symptoms.    Past Medical History:  Diagnosis Date  . Anemia   . Aortic stenosis    a. echo 08/2014: EF 55-60%, no RWMA, GR1DD, severe AS, mild AI, Mean gradient (S): 38 mm Hg. Valve area (VTI): 0.78 cm^2., mild-mod MR, LA mildly dilated, atrial septal aneurysm, mild TR  . Bone disease   . Carotid arterial disease ([place])    a. 08/2014 U/S: Bilateral < 50% stneosis. Patent vertebrals w/ antegrade flow.   . Cervical spine arthritis ([place])   . Cognitive impairment   . Depression   . Diabetes mellitus without complication ([place])   . Diastolic dysfunction    a. echo 2014: EF 55-60%, DD, mild LVH, moderate AS;  b. 08/2014 Echo: EF 55-60%, no RWMA, GR1DD, severe AS, mild AI, Mean gradient (S): 38 mm Hg. Valve area (VTI): 0.78 cm^2., mild-mod MR, LA mildly dilated, atrial septal aneurysm, mild TR  . Essential hypertension   . GERD (gastroesophageal reflux disease)   . Glaucoma   . Gout   . Hiatal hernia   . History of renal impairment   . Hyperlipidemia   . Hypotension    a. Related to Norvasc - 11/2014 ED visit.  . Multinodular goiter    a. Noted incidentally on CT 07/2012 and carotid U/S 08/2014;  b. Nl TSH 11/2014.   Marland Kitchen Paroxysmal SVT (supraventricular tachycardia) (HCC)    a. on Toprol   . Prolapsed uterus   . Stroke [facility])    a. CT head 07/2014 showed small thalamic infarct  . Vitamin deficiency     Patient Active Problem List   Diagnosis Date Noted  . Leg swelling 09/14/2015  . Essential hypertension   . Hyperlipidemia   . Diabetes mellitus without complication ([place])   . Paroxysmal SVT (supraventricular tachycardia) ([place])   . Multinodular goiter   . Carotid arterial disease ([place])   . Hypotension   . Aortic stenosis   . NSTEMI (non-ST elevated myocardial infarction) ([place]) 09/10/2014  . Renal failure 08/28/2014  . SVT (supraventricular tachycardia) ([place]) 12/17/2012  . Diabetes mellitus, type 2 ([place]) 12/17/2012  . SOB (shortness of breath) 12/17/2012  . Essential hypertension, malignant 12/17/2012  . Chronic diastolic CHF (congestive heart failure) ([place]) 12/17/2012    Past Surgical History:  Procedure Laterality Date  . TUBAL LIGATION      Prior to Admission medications   Medication Sig Start Date End Date Taking? Authorizing Provider  allopurinol (ZYLOPRIM) 300 MG tablet Take 300 mg by mouth daily.   Yes Historical Provider, MD  amoxicillin-clavulanate (AUGMENTIN) 875-125 MG tablet Take 1 tablet by mouth 2 (two) times daily. 01/23/16 01/30/16 Yes Lavonia Drafts, MD  aspirin 81 MG tablet Take 81 mg by mouth daily.   Yes Historical Provider,  MD  brimonidine (ALPHAGAN P) 0.1 % SOLN Place 1 drop into the right eye 2 (two) times daily.   Yes Historical Provider, MD  cholecalciferol (VITAMIN D) 1000 UNITS tablet Take 2,000 Units by mouth daily.    Yes Historical Provider, MD  diphenhydrAMINE (BENADRYL) 25 mg capsule Take 1 capsule (25 mg total) by mouth every 8 (eight) hours as needed for itching. 09/12/14  Yes Vaughan Basta, MD  Dorzolamide HCl-Timolol Mal (COSOPT OP) Apply to eye 2 (two) times daily.   Yes Historical Provider, MD  doxazosin (CARDURA) 4 MG tablet Take 1 tablet (4 mg  total) by mouth 2 (two) times daily. 09/14/15  Yes Minna Merritts, MD  gabapentin (NEURONTIN) 300 MG capsule Take 300 mg by mouth 2 (two) times daily.   Yes Historical Provider, MD  glimepiride (AMARYL) 1 MG tablet Take 1 mg by mouth daily with breakfast.   Yes Historical Provider, MD  hydrALAZINE (APRESOLINE) 50 MG tablet Take 1 tablet (50 mg total) by mouth 3 (three) times daily. 01/14/16 04/13/16 Yes Minna Merritts, MD  iron polysaccharides (NIFEREX) 150 MG capsule Take 150 mg by mouth 2 (two) times daily.   Yes Historical Provider, MD  isosorbide mononitrate (IMDUR) 60 MG 24 hr tablet Take 1 tablet (60 mg total) by mouth 2 (two) times daily. 12/31/15 12/30/16 Yes Minna Merritts, MD  latanoprost (XALATAN) 0.005 % ophthalmic solution Place 1 drop into the right eye at bedtime. 07/03/14  Yes Historical Provider, MD  lisinopril (PRINIVIL,ZESTRIL) 40 MG tablet TAKE 1 TABLET BY MOUTH ONCE DAILY (8 AM) 01/14/16  Yes Minna Merritts, MD  lovastatin (MEVACOR) 40 MG tablet Take 40 mg by mouth at bedtime.   Yes Historical Provider, MD  metoprolol succinate (TOPROL-XL) 25 MG 24 hr tablet Take 3 tablets (75 mg total) by mouth daily. Take with or immediately following a meal. 09/12/14  Yes Vaughan Basta, MD  pantoprazole (PROTONIX) 40 MG tablet Take 40 mg by mouth 2 (two) times daily.   Yes Historical Provider, MD  pilocarpine (PILOCAR) 4 % ophthalmic solution Place 1 drop into the right eye 4 (four) times daily.    Yes Historical Provider, MD  tiZANidine (ZANAFLEX) 4 MG tablet Take 1 tablet by mouth 3 (three) times daily as needed. 08/08/14  Yes Historical Provider, MD    Allergies  Allergen Reactions  . Metformin And Related     Family History  Problem Relation Age of Onset  . Glaucoma Mother   . Diabetes Mother   . Peptic Ulcer Father   . Colitis Father     Social History Social History  Substance Use Topics  . Smoking status: Never Smoker  . Smokeless tobacco: Never Used  . Alcohol  use No    Review of Systems  Constitutional: Negative for fever. Eyes: Negative for visual changes. ENT: Some sinus congestion. Cardiovascular: Negative for chest pain. Respiratory: Negative for shortness of breath. Gastrointestinal: Negative for abdominal pain, vomiting and diarrhea. Genitourinary: Negative for dysuria. Musculoskeletal: Negative for back pain. Skin: Negative for rash. Neurological: Negative for headache. 10 point Review of Systems otherwise negative ____________________________________________   PHYSICAL EXAM:  VITAL SIGNS: ED Triage Vitals [01/27/16 1038]  Enc Vitals Group     BP 139/63     Pulse Rate 74     Resp 18     Temp 98.3 F (36.8 C)     Temp Source Oral     SpO2 97 %     Weight 153 lb (  69.4 kg)     Height 5' (1.524 m)     Head Circumference      Peak Flow      Pain Score      Pain Loc      Pain Edu?      Excl. in [place]?      Constitutional: Alert and oriented. Well appearing and in no distress. HEENT   Head: Normocephalic and atraumatic.      Eyes: Conjunctivae are normal. PERRL. Normal extraocular movements.      Ears:         Nose: No congestion/rhinnorhea.   Mouth/Throat: Mucous membranes are moist.   Neck: No stridor. Cardiovascular/Chest: Normal rate, regular rhythm.  No murmurs, rubs, or gallops. Respiratory: Normal respiratory effort without tachypnea nor retractions. Breath sounds are clear and equal bilaterally. No wheezes/rales/rhonchi. Gastrointestinal: Soft. No distention, no guarding, no rebound. Nontender.    Genitourinary/rectal:Deferred Musculoskeletal: Nontender with normal range of motion in all extremities. No joint effusions.  No lower extremity tenderness.  No edema. Neurologic:  Normal speech and language. No gross or focal neurologic deficits are appreciated. Skin:  Skin is warm, dry and intact. No rash noted. Psychiatric: Mood and affect are normal. Speech and behavior are normal. Patient exhibits  appropriate insight and judgment.   ____________________________________________  LABS (pertinent positives/negatives)  Labs Reviewed  BASIC METABOLIC PANEL - Abnormal; Notable for the following:       Result Value   Glucose, Bld 229 (*)    GFR calc non Af Amer 52 (*)    GFR calc Af Amer 60 (*)    All other components within normal limits  CBC - Abnormal; Notable for the following:    RBC 3.76 (*)    RDW 15.2 (*)    All other components within normal limits  URINALYSIS COMPLETEWITH MICROSCOPIC ([facility] ONLY) - Abnormal; Notable for the following:    Color, Urine YELLOW (*)    APPearance HAZY (*)    Glucose, UA 50 (*)    Leukocytes, UA 1+ (*)    Squamous Epithelial / LPF 6-30 (*)    All other components within normal limits  CBG MONITORING, ED    ____________________________________________    EKG I, Lisa Roca, MD, the attending physician have personally viewed and interpreted all ECGs.  72bpm.  Narrow qrs.  Left axis deviation.  Nonspecific st and t wave. ____________________________________________  RADIOLOGY All Xrays were viewed by me. Imaging interpreted by Radiologist.  None __________________________________________  PROCEDURES  Procedure(s) performed: None  Critical Care performed: None  ____________________________________________   ED COURSE / ASSESSMENT AND PLAN  Pertinent labs & imaging results that were available during my care of the patient were reviewed by me and considered in my medical decision making (see chart for details).   [name] was brought in by her family for evaluation after generalized fatigue for what sounds like over a week now after being treated for congestion and possible sinus infection. She is overall well-appearing without any complaint or finding on exam of focal neurologic deficit. She's not been febrile. She has no respiratory complaints. She has no cardiac complaint. She has no abdominal complaints. No reported  thallium/fluid losses.  No fever here stable vital signs. No oxygenation.  Laboratory studies are reassuring. Her urinalysis is not consistent with urinary tract infection.  I discussed with the patient and the family that everything looks reassuring today and I think it is okay for discharge home today with close outpatient follow-up with  her primary care physician.    CONSULTATIONS:   None   Patient / Family / Caregiver informed of clinical course, medical decision-making process, and agree with plan.   I discussed return precautions, follow-up instructions, and discharge instructions with patient and/or family.   ___________________________________________   FINAL CLINICAL IMPRESSION(S) / ED DIAGNOSES   Final diagnoses:  Weakness  Generalized weakness  Hyperglycemia  URI, acute              Note: This dictation was prepared with Dragon dictation. Any transcriptional errors that result from this process are unintentional    Lisa Roca, MD 01/27/16 1555

## 2016-01-29 ENCOUNTER — Telehealth: Payer: Self-pay | Admitting: Emergency Medicine

## 2016-01-29 NOTE — Telephone Encounter (Signed)
Daughter called asking for something for yeast infection.  patietn was placed on augmentin in the ED.  Per dr Alfred Levins can call in fluconazole 150 mg po x 1 dose.  Called to [facility] [place].

## 2016-02-03 ENCOUNTER — Telehealth: Payer: Self-pay | Admitting: Cardiovascular Disease

## 2016-02-03 NOTE — Telephone Encounter (Signed)
Spoke with patients daughter per release form and she states that her mother has been very weak and not feeling well. She states that she had a head cold and was taken to the hospital two different times. She states that she was on antibiotics which she has finished but she is still not feeling well and her face is somewhat swollen. She reports that her weight is up to 164 and her blood pressures have been high in the morning prior to taking her medications and lower in the afternoon. She denied any chest pain or shortness of breath. She reports that she has been taking hydralazine in the morning and only taking 1/2 tablet at noon and at bedtime. Instructed her to have her mother increase hydralazine to 1 whole tablet three times daily and to continue monitoring her blood pressures. She states that her mother went to see her primary care provider today. Let her know to please give [place] a call back if she continues to have elevated blood pressures or any further questions. She verbalized understanding and had no further questions.

## 2016-02-03 NOTE — Telephone Encounter (Signed)
Pt daughter calling wanting to give [place] BP readings  02/03/16 140/90 02/02/16 159/91  02/01/16 173/96  01/31/16 133/65 01/30/16 140/78  Please look at these and give some advise

## 2016-03-02 ENCOUNTER — Encounter: Payer: Self-pay | Admitting: Cardiovascular Disease

## 2016-03-02 ENCOUNTER — Ambulatory Visit (INDEPENDENT_AMBULATORY_CARE_PROVIDER_SITE_OTHER): Payer: Medicare HMO | Admitting: Cardiovascular Disease

## 2016-03-02 VITALS — BP 130/74 | HR 78 | Ht 60.0 in | Wt 145.2 lb

## 2016-03-02 DIAGNOSIS — M7989 Other specified soft tissue disorders: Secondary | ICD-10-CM

## 2016-03-02 DIAGNOSIS — I350 Nonrheumatic aortic (valve) stenosis: Secondary | ICD-10-CM | POA: Diagnosis not present

## 2016-03-02 DIAGNOSIS — I471 Supraventricular tachycardia: Secondary | ICD-10-CM

## 2016-03-02 DIAGNOSIS — E1159 Type 2 diabetes mellitus with other circulatory complications: Secondary | ICD-10-CM

## 2016-03-02 DIAGNOSIS — I10 Essential (primary) hypertension: Secondary | ICD-10-CM | POA: Diagnosis not present

## 2016-03-02 NOTE — Progress Notes (Signed)
Cardiology Office Note  Date:  03/02/2016   ID:  JEARLINE Green, DOB August 30, 1924, MRN [id number]  PCP:  [name], MD   Chief Complaint  Patient presents with  . other    Early f/u. Pt call c/o elevated Bp readings and swollen legs. Pt's daughter states she has been more tired and has had ongoing cold. Reviewed meds with pt verbally.    HPI:  [name] is a pleasant 83 year old woman with a history of diabetes, hypertension, SVT, with hospital admission July 2016 for SVT after she developed numerous bug bites with associated pain. She presents for follow-up of her arrhythmia, hypertension and lower extremity swelling Amlodipine previously held with improvement of her leg swelling  In follow-up today, she presents with her daughter  Patient lives alone , daughter lives next door Weight down 15 pounds over the past several months "eating little bits" Drinks lots of fluids, feels less hungry  Over the past week, edema has recurred He has been sitting more, perhaps more salt  Daughter reports blood pressure is improved, generally runs 314 systolic No significant side effects HCTZ previously held for renal dysfunction   she feels Tired, no regular walking  no recent falls   Denies any significant chest pain concerning for angina. Denies having any tachycardia concerning for arrhythmia  EKG on today's visit shows normal sinus rhythm with rate 78 bpm, no significant ST or T-wave changes  Other past medical history  on her hospital admission July 2016,She reports that she had a rash at the time. Turns out she went walking in her yard, developed insect bites all of her legs and abdomen (chiger bits). She was in a tremendous amount of discomfort with severe itching. Likely secondary to that, she developed arrhythmia. she converted back to normal sinus rhythm in the hospital. At the time of discharge her metoprolol was increased from 50 up to 75 mg daily. She is felt  well with no further episodes of tachycardia. She currently lives at home, independent  admitted to the hospital 09/20/2012 discharged on 09/25/2012 for Klebsiella UTI, shortness breath, tachycardia, SVT. Arrival to the hospital, she was very weak and was unable to swallow. She was felt to be very dehydrated. Her potassium was repleted, she was started on metoprolol and Cardizem.  Echocardiogram in the hospital 09/19/2012 showed ejection fraction 97-02%, diastolic dysfunction, mild LVH, moderate aortic valve stenosis, normal RV function and size  CT scan of the neck showed enlargement of the thyroid with multinodular appearance, atherosclerotic disease, right pleural effusion Lab work in the hospital 09/11/2012 showing creatinine 1.57, BUN 44 Followup blood work showed creatinine 1.07, BUN 17 on July 23  PMH:   has a past medical history of Anemia; Aortic stenosis; Bone disease; Carotid arterial disease ([place]); Cervical spine arthritis ([place]); Cognitive impairment; Depression; Diabetes mellitus without complication ([name]); Diastolic dysfunction; Essential hypertension; GERD (gastroesophageal reflux disease); Glaucoma; Gout; Hiatal hernia; History of renal impairment; Hyperlipidemia; Hypotension; Multinodular goiter; Paroxysmal SVT (supraventricular tachycardia) ([place]); Prolapsed uterus; Stroke [facility]); and Vitamin deficiency.  PSH:    Past Surgical History:  Procedure Laterality Date  . TUBAL LIGATION      Current Outpatient Prescriptions  Medication Sig Dispense Refill  . allopurinol (ZYLOPRIM) 300 MG tablet Take 300 mg by mouth daily.    Marland Kitchen aspirin 81 MG tablet Take 81 mg by mouth daily.    . brimonidine (ALPHAGAN P) 0.1 % SOLN Place 1 drop into the right eye 2 (two) times daily.    Marland Kitchen  cholecalciferol (VITAMIN D) 1000 UNITS tablet Take 2,000 Units by mouth daily.     . Dorzolamide HCl-Timolol Mal (COSOPT OP) Apply to eye 2 (two) times daily.    Marland Kitchen doxazosin (CARDURA) 4 MG tablet Take 1 tablet  (4 mg total) by mouth 2 (two) times daily. 60 tablet 11  . gabapentin (NEURONTIN) 300 MG capsule Take 300 mg by mouth 2 (two) times daily.    Marland Kitchen glimepiride (AMARYL) 1 MG tablet Take 1 mg by mouth daily with breakfast.    . hydrALAZINE (APRESOLINE) 50 MG tablet Take 1 tablet (50 mg total) by mouth 3 (three) times daily. (Patient taking differently: Take 25 mg by mouth 3 (three) times daily. ) 270 tablet 3  . iron polysaccharides (NIFEREX) 150 MG capsule Take 150 mg by mouth 2 (two) times daily.    . isosorbide mononitrate (IMDUR) 60 MG 24 hr tablet Take 1 tablet (60 mg total) by mouth 2 (two) times daily. 60 tablet 6  . latanoprost (XALATAN) 0.005 % ophthalmic solution Place 1 drop into the right eye at bedtime.  3  . lisinopril (PRINIVIL,ZESTRIL) 40 MG tablet Take 40 mg by mouth at bedtime.    . lovastatin (MEVACOR) 40 MG tablet Take 40 mg by mouth at bedtime.    . metoprolol succinate (TOPROL-XL) 25 MG 24 hr tablet Take 3 tablets (75 mg total) by mouth daily. Take with or immediately following a meal. 120 tablet 0  . pantoprazole (PROTONIX) 40 MG tablet Take 40 mg by mouth 2 (two) times daily.    . pilocarpine (PILOCAR) 4 % ophthalmic solution Place 1 drop into the right eye 4 (four) times daily.     Marland Kitchen tiZANidine (ZANAFLEX) 4 MG tablet Take 1 tablet by mouth 3 (three) times daily as needed.  1   No current facility-administered medications for this visit.      Allergies:   Metformin and related   Social History:  The patient  reports that she has never smoked. She has never used smokeless tobacco. She reports that she does not drink alcohol or use drugs.   Family History:   family history includes Colitis in her father; Diabetes in her mother; Glaucoma in her mother; Peptic Ulcer in her father.    Review of Systems: Review of Systems  Constitutional: Positive for malaise/fatigue.  Respiratory: Negative.   Cardiovascular: Negative.   Gastrointestinal: Negative.   Musculoskeletal:  Negative.   Neurological: Positive for weakness.  Psychiatric/Behavioral: Negative.   All other systems reviewed and are negative.    PHYSICAL EXAM: VS:  BP 130/74 (BP Location: Left Arm, Patient Position: Sitting, Cuff Size: Normal)   Pulse 78   Ht 5' (1.524 m)   Wt 145 lb 4 oz (65.9 kg)   BMI 28.37 kg/m  , BMI Body mass index is 28.37 kg/m. GEN: Well nourished, well developed, in no acute distress, obese  HEENT: normal  Neck: no JVD, carotid bruits, or masses Cardiac: RRR; no murmurs, rubs, or gallops, trace nonpitting leg edema Respiratory:  clear to auscultation bilaterally, normal work of breathing GI: soft, nontender, nondistended, + BS MS: no deformity or atrophy  Skin: warm and dry, no rash Neuro:  Strength and sensation are intact Psych: euthymic mood, full affect    Recent Labs: 01/27/2016: BUN 16; Creatinine, Ser 0.95; Hemoglobin 12.1; Platelets 162; Potassium 4.2; Sodium 136    Lipid Panel Lab Results  Component Value Date   CHOL 114 09/11/2014   HDL 40 (L) 09/11/2014  LDLCALC 55 09/11/2014   TRIG 97 09/11/2014      Wt Readings from Last 3 Encounters:  03/02/16 145 lb 4 oz (65.9 kg)  01/27/16 153 lb (69.4 kg)  01/23/16 160 lb (72.6 kg)       ASSESSMENT AND PLAN:  Essential hypertension - Plan: EKG 12-Lead Recommended she continue on her current medications including his lisinopril 40 mg daily, metoprolol 75 mg daily, isosorbide 60 mg daily, Cardura 8 mg daily, hydralazine 25 mg 3 times a day  SVT (supraventricular tachycardia) (HCC) Denies any symptoms concerning for arrhythmia  Chronic diastolic CHF (congestive heart failure) (HCC) Not on diuretic at this time Leg edema is worse in the past week, likely secondary to venous insufficiency. Echocardiogram ordered given her aortic valve stenosis, will estimate right heart pressures to guide diuresis  Pure hypercholesterolemia Cholesterol is at goal on the current lipid regimen. No changes to  the medications were made.  Bilateral leg edema Recommended she wear compression hose, leg elevation Avoid calcium channel blockers  Bilateral carotid artery disease (HCC) Mild bilateral disease in 2016  Type 2 diabetes mellitus with other circulatory complication, unspecified long term insulin use status ([place]) Reports she is unable to exercise. Recommended low carbohydrate diet    Total encounter time more than 25 minutes  Greater than 50% was spent in counseling and coordination of care with the patient  Disposition:   F/U  6 months   Orders Placed This Encounter  Procedures  . EKG 12-Lead  . ECHOCARDIOGRAM COMPLETE     Signed, Esmond Plants, M.D., Ph.D. 03/02/2016  [place], [place]

## 2016-03-02 NOTE — Patient Instructions (Signed)
Medication Instructions:   No medication changes made  Leg elevation, compression hose  Labwork:  No new labs needed  Testing/Procedures:   We will order an echocardiogram for leg swelling, aortic valve stenosis   I recommend watching educational videos on topics of interest to you at:       [email or web address]  Enter code: [email or web address]    Follow-Up: It was a pleasure seeing you in the office today. Please call us if you have new issues that need to be addressed before your next appt.  [phone]  Your physician wants you to follow-up in: 6 months.  You will receive a reminder letter in the mail two months in advance. If you don't receive a letter, please call our office to schedule the follow-up appointment.  If you need a refill on your cardiac medications before your next appointment, please call your pharmacy.

## 2016-03-23 ENCOUNTER — Other Ambulatory Visit: Payer: Self-pay

## 2016-03-23 ENCOUNTER — Ambulatory Visit (INDEPENDENT_AMBULATORY_CARE_PROVIDER_SITE_OTHER): Payer: Medicare HMO

## 2016-03-23 ENCOUNTER — Telehealth: Payer: Self-pay | Admitting: Cardiovascular Disease

## 2016-03-23 DIAGNOSIS — I350 Nonrheumatic aortic (valve) stenosis: Secondary | ICD-10-CM

## 2016-03-23 DIAGNOSIS — I10 Essential (primary) hypertension: Secondary | ICD-10-CM

## 2016-03-23 DIAGNOSIS — M7989 Other specified soft tissue disorders: Secondary | ICD-10-CM

## 2016-03-23 MED ORDER — FUROSEMIDE 20 MG PO TABS: 20.0000 mg | ORAL_TABLET | Freq: Every day | ORAL | 6 refills | Status: DC

## 2016-03-23 MED ORDER — POTASSIUM CHLORIDE ER 10 MEQ PO TBCR: 10.0000 meq | EXTENDED_RELEASE_TABLET | Freq: Every day | ORAL | 6 refills | Status: DC

## 2016-03-23 NOTE — Telephone Encounter (Signed)
Per Dr. Rockey Situ, after reviewing her ECHO from today, he recommends that pt start lasix 20 mg BID x 3 days, then 20 mg daily. Take K+ 10 meq w/ lasix. Referral to TAVR clinic w/ Dr. Burt Knack.  Left message for pt that I am sending lasix in to her pharmacy.   Left detailed instructions on how to take. Advised her that I am placing referral to Dr. Antionette Char office, that she should hear from either our office or theirs w/ an appt, hopefully tomorrow, as it is after hours.  Asked her to call back w/ any questions or concerns.

## 2016-03-24 ENCOUNTER — Other Ambulatory Visit: Payer: Self-pay | Admitting: *Deleted

## 2016-03-24 NOTE — Telephone Encounter (Signed)
Spoke with patients daughter per release form and reviewed in detail recommendations from Dr. Rockey Situ and she verbalized understanding with no further questions at this time. Appointment set up already with Dr. Burt Knack as well. Instructed her to give [place] a call if they have any further questions.

## 2016-03-25 ENCOUNTER — Emergency Department
Admission: EM | Admit: 2016-03-25 | Discharge: 2016-03-25 | Disposition: A | Discharge status: Home / Self Care | Payer: Medicare HMO | Source: Home / Self Care | Attending: Emergency Medicine | Admitting: Emergency Medicine

## 2016-03-25 ENCOUNTER — Encounter: Payer: Self-pay | Admitting: Emergency Medicine

## 2016-03-25 DIAGNOSIS — D649 Anemia, unspecified: Secondary | ICD-10-CM

## 2016-03-25 DIAGNOSIS — Z7982 Long term (current) use of aspirin: Secondary | ICD-10-CM

## 2016-03-25 DIAGNOSIS — A4181 Sepsis due to Enterococcus: Secondary | ICD-10-CM | POA: Diagnosis not present

## 2016-03-25 DIAGNOSIS — R748 Abnormal levels of other serum enzymes: Secondary | ICD-10-CM | POA: Diagnosis not present

## 2016-03-25 DIAGNOSIS — Z79899 Other long term (current) drug therapy: Secondary | ICD-10-CM

## 2016-03-25 DIAGNOSIS — I5032 Chronic diastolic (congestive) heart failure: Secondary | ICD-10-CM

## 2016-03-25 DIAGNOSIS — I110 Hypertensive heart disease with heart failure: Secondary | ICD-10-CM

## 2016-03-25 DIAGNOSIS — N814 Uterovaginal prolapse, unspecified: Secondary | ICD-10-CM | POA: Insufficient documentation

## 2016-03-25 LAB — CBC
HEMATOCRIT: 30.2 % — AB (ref 35.0–47.0)
HEMOGLOBIN: 10 g/dL — AB (ref 12.0–16.0)
MCH: 30.1 pg (ref 26.0–34.0)
MCHC: 33.2 g/dL (ref 32.0–36.0)
MCV: 90.9 fL (ref 80.0–100.0)
Platelets: 169 10*3/uL (ref 150–440)
RBC: 3.33 MIL/uL — AB (ref 3.80–5.20)
RDW: 14.9 % — ABNORMAL HIGH (ref 11.5–14.5)
WBC: 7.6 10*3/uL (ref 3.6–11.0)

## 2016-03-25 LAB — URINALYSIS, COMPLETE (UACMP) WITH MICROSCOPIC
Bilirubin Urine: NEGATIVE
GLUCOSE, UA: NEGATIVE mg/dL
HGB URINE DIPSTICK: NEGATIVE
Ketones, ur: NEGATIVE mg/dL
LEUKOCYTES UA: NEGATIVE
NITRITE: NEGATIVE
PH: 5 (ref 5.0–8.0)
PROTEIN: NEGATIVE mg/dL
SPECIFIC GRAVITY, URINE: 1.014 (ref 1.005–1.030)

## 2016-03-25 LAB — COMPREHENSIVE METABOLIC PANEL
ALBUMIN: 3.4 g/dL — AB (ref 3.5–5.0)
ALT: 15 U/L (ref 14–54)
ANION GAP: 6 (ref 5–15)
AST: 32 U/L (ref 15–41)
Alkaline Phosphatase: 42 U/L (ref 38–126)
BILIRUBIN TOTAL: 0.9 mg/dL (ref 0.3–1.2)
BUN: 14 mg/dL (ref 6–20)
CALCIUM: 8.9 mg/dL (ref 8.9–10.3)
CO2: 30 mmol/L (ref 22–32)
Chloride: 102 mmol/L (ref 101–111)
Creatinine, Ser: 0.97 mg/dL (ref 0.44–1.00)
GFR calc non Af Amer: 50 mL/min — ABNORMAL LOW (ref >60)
GFR, EST AFRICAN AMERICAN: 58 mL/min — AB (ref >60)
GLUCOSE: 109 mg/dL — AB (ref 65–99)
POTASSIUM: 3.8 mmol/L (ref 3.5–5.1)
SODIUM: 138 mmol/L (ref 135–145)
TOTAL PROTEIN: 6.8 g/dL (ref 6.5–8.1)

## 2016-03-25 LAB — TYPE AND SCREEN
ABO/RH(D): A POS
ANTIBODY SCREEN: NEGATIVE

## 2016-03-25 NOTE — ED Triage Notes (Signed)
C/O feeling weak.  Patient's daughter noticed some rectal bleeding. Seen by PCP yesterday, HGB was low, 9.1-- in December HGB was 11.6.

## 2016-03-25 NOTE — ED Provider Notes (Signed)
[facility] Emergency Department Provider Note        Time seen: ----------------------------------------- 6:09 PM on 03/25/2016 -----------------------------------------    I have reviewed the triage vital signs and the nursing notes.   HISTORY  Chief Complaint Rectal Bleeding    HPI [name] is a 83 y.o. female who presents to ER for feeling weak. Daughter voice they have noticed some blood rectally. They state when she wipes or in her pain and she has had small amount of bright blood. She was seen by her primary care doctor yesterday and was told she had a low hemoglobin. She was sent to the ER for further evaluation. Patient denies any other complaints, they do know she does have uterine prolapse at this time.   Past Medical History:  Diagnosis Date  . Anemia   . Aortic stenosis    a. echo 08/2014: EF 55-60%, no RWMA, GR1DD, severe AS, mild AI, Mean gradient (S): 38 mm Hg. Valve area (VTI): 0.78 cm^2., mild-mod MR, LA mildly dilated, atrial septal aneurysm, mild TR  . Bone disease   . Carotid arterial disease ([place])    a. 08/2014 U/S: Bilateral < 50% stneosis. Patent vertebrals w/ antegrade flow.   . Cervical spine arthritis ([place])   . Cognitive impairment   . Depression   . Diabetes mellitus without complication ([place])   . Diastolic dysfunction    a. echo 2014: EF 55-60%, DD, mild LVH, moderate AS;  b. 08/2014 Echo: EF 55-60%, no RWMA, GR1DD, severe AS, mild AI, Mean gradient (S): 38 mm Hg. Valve area (VTI): 0.78 cm^2., mild-mod MR, LA mildly dilated, atrial septal aneurysm, mild TR  . Essential hypertension   . GERD (gastroesophageal reflux disease)   . Glaucoma   . Gout   . Hiatal hernia   . History of renal impairment   . Hyperlipidemia   . Hypotension    a. Related to Norvasc - 11/2014 ED visit.  . Multinodular goiter    a. Noted incidentally on CT 07/2012 and carotid U/S 08/2014;  b. Nl TSH 11/2014.  Marland Kitchen Paroxysmal SVT (supraventricular  tachycardia) (HCC)    a. on Toprol   . Prolapsed uterus   . Stroke [facility])    a. CT head 07/2014 showed small thalamic infarct  . Vitamin deficiency     Patient Active Problem List   Diagnosis Date Noted  . Leg swelling 09/14/2015  . Essential hypertension   . Hyperlipidemia   . Diabetes mellitus without complication ([place])   . Paroxysmal SVT (supraventricular tachycardia) ([place])   . Multinodular goiter   . Carotid arterial disease ([place])   . Hypotension   . Aortic stenosis   . NSTEMI (non-ST elevated myocardial infarction) ([place]) 09/10/2014  . Renal failure 08/28/2014  . SVT (supraventricular tachycardia) ([place]) 12/17/2012  . Diabetes mellitus, type 2 ([place]) 12/17/2012  . SOB (shortness of breath) 12/17/2012  . Essential hypertension, malignant 12/17/2012  . Chronic diastolic CHF (congestive heart failure) ([place]) 12/17/2012    Past Surgical History:  Procedure Laterality Date  . TUBAL LIGATION      Allergies Metformin and related  Social History Social History  Substance Use Topics  . Smoking status: Never Smoker  . Smokeless tobacco: Never Used  . Alcohol use No    Review of Systems Constitutional: Negative for fever. Cardiovascular: Negative for chest pain. Respiratory: Negative for shortness of breath. Gastrointestinal: Negative for abdominal pain, vomiting and diarrhea.Positive for possible rectal bleeding Genitourinary: Negative for dysuria. Musculoskeletal:  Negative for back pain. Skin: Negative for rash. Neurological: Negative for headaches, focal weakness or numbness.  10-point ROS otherwise negative.  ____________________________________________   PHYSICAL EXAM:  VITAL SIGNS: ED Triage Vitals  Enc Vitals Group     BP --      Pulse --      Resp --      Temp --      Temp src --      SpO2 --      Weight 03/25/16 1538 163 lb (73.9 kg)     Height 03/25/16 1538 5' (1.524 m)     Head Circumference --      Peak Flow --      Pain Score 03/25/16 1539 0      Pain Loc --      Pain Edu? --      Excl. in [place]? --     Constitutional: Alert and oriented. Well appearing and in no distress. Eyes: Conjunctivae are normal. PERRL. Normal extraocular movements. ENT   Head: Normocephalic and atraumatic.   Nose: No congestion/rhinnorhea.   Mouth/Throat: Mucous membranes are moist.   Neck: No stridor. Cardiovascular: Normal rate, regular rhythm. No murmurs, rubs, or gallops. Respiratory: Normal respiratory effort without tachypnea nor retractions. Breath sounds are clear and equal bilaterally. No wheezes/rales/rhonchi. Gastrointestinal: Soft and nontender. Normal bowel sounds, rectal exam is heme negative, no stool, no hemorrhoids Genitourinary: Uterine prolapse is evident, this is reducible but leads to subsequent relapse Musculoskeletal: Nontender with normal range of motion in all extremities. No lower extremity tenderness nor edema. Neurologic:  Normal speech and language. No gross focal neurologic deficits are appreciated.  Skin:  Skin is warm, dry and intact. No rash noted. Psychiatric: Mood and affect are normal. Speech and behavior are normal.  ____________________________________________  ED COURSE:  Pertinent labs & imaging results that were available during my care of the patient were reviewed by me and considered in my medical decision making (see chart for details). Patient resents to ER with concerns for possible rectal bleeding. We will assess with basic labs, here she is heme negative. I suspect blood noted rectally was probably from uterine prolapse.   Procedures ____________________________________________   LABS (pertinent positives/negatives)  Labs Reviewed  COMPREHENSIVE METABOLIC PANEL - Abnormal; Notable for the following:       Result Value   Glucose, Bld 109 (*)    Albumin 3.4 (*)    GFR calc non Af Amer 50 (*)    GFR calc Af Amer 58 (*)    All other components within normal limits  CBC - Abnormal;  Notable for the following:    RBC 3.33 (*)    Hemoglobin 10.0 (*)    HCT 30.2 (*)    RDW 14.9 (*)    All other components within normal limits  POC OCCULT BLOOD, ED  TYPE AND SCREEN   ____________________________________________  FINAL ASSESSMENT AND PLAN  Uterine prolapse  Plan: Patient with labs as dictated above. Family reports outpatient hemoglobin was 9.5. Today it has gone back up to 10.0. She does need to follow-up with her doctor. Her uterine prolapse has been unsuccessfully treated in the past. I'm unclear if there is any further treatment to offer her. She is stable for outpatient follow-up.   Earleen Newport, MD   Note: This note was generated in part or whole with voice recognition software. Voice recognition is usually quite accurate but there are transcription errors that can and very often do occur. I  apologize for any typographical errors that were not detected and corrected.     Earleen Newport, MD 03/25/16 [phone]

## 2016-03-27 ENCOUNTER — Telehealth: Payer: Self-pay | Admitting: Physician Assistant

## 2016-03-27 ENCOUNTER — Encounter: Payer: Self-pay | Admitting: Radiology

## 2016-03-27 ENCOUNTER — Emergency Department: Payer: Medicare HMO

## 2016-03-27 ENCOUNTER — Inpatient Hospital Stay
Admission: EM | Admit: 2016-03-27 | Discharge: 2016-04-01 | DRG: 872 | Disposition: A | Discharge status: Home / Self Care | Payer: Medicare HMO | Attending: Internal Medicine | Admitting: Internal Medicine

## 2016-03-27 DIAGNOSIS — J189 Pneumonia, unspecified organism: Secondary | ICD-10-CM | POA: Diagnosis present

## 2016-03-27 DIAGNOSIS — I248 Other forms of acute ischemic heart disease: Secondary | ICD-10-CM | POA: Diagnosis present

## 2016-03-27 DIAGNOSIS — Z83511 Family history of glaucoma: Secondary | ICD-10-CM

## 2016-03-27 DIAGNOSIS — R778 Other specified abnormalities of plasma proteins: Secondary | ICD-10-CM

## 2016-03-27 DIAGNOSIS — Z833 Family history of diabetes mellitus: Secondary | ICD-10-CM

## 2016-03-27 DIAGNOSIS — M545 Low back pain, unspecified: Secondary | ICD-10-CM

## 2016-03-27 DIAGNOSIS — A4181 Sepsis due to Enterococcus: Principal | ICD-10-CM | POA: Diagnosis present

## 2016-03-27 DIAGNOSIS — D1809 Hemangioma of other sites: Secondary | ICD-10-CM | POA: Diagnosis present

## 2016-03-27 DIAGNOSIS — I5032 Chronic diastolic (congestive) heart failure: Secondary | ICD-10-CM | POA: Diagnosis present

## 2016-03-27 DIAGNOSIS — E119 Type 2 diabetes mellitus without complications: Secondary | ICD-10-CM | POA: Diagnosis present

## 2016-03-27 DIAGNOSIS — I872 Venous insufficiency (chronic) (peripheral): Secondary | ICD-10-CM | POA: Diagnosis present

## 2016-03-27 DIAGNOSIS — A419 Sepsis, unspecified organism: Secondary | ICD-10-CM

## 2016-03-27 DIAGNOSIS — M1990 Unspecified osteoarthritis, unspecified site: Secondary | ICD-10-CM | POA: Diagnosis present

## 2016-03-27 DIAGNOSIS — B952 Enterococcus as the cause of diseases classified elsewhere: Secondary | ICD-10-CM

## 2016-03-27 DIAGNOSIS — M549 Dorsalgia, unspecified: Secondary | ICD-10-CM

## 2016-03-27 DIAGNOSIS — J069 Acute upper respiratory infection, unspecified: Secondary | ICD-10-CM | POA: Diagnosis present

## 2016-03-27 DIAGNOSIS — Z9851 Tubal ligation status: Secondary | ICD-10-CM

## 2016-03-27 DIAGNOSIS — R262 Difficulty in walking, not elsewhere classified: Secondary | ICD-10-CM

## 2016-03-27 DIAGNOSIS — Z888 Allergy status to other drugs, medicaments and biological substances status: Secondary | ICD-10-CM

## 2016-03-27 DIAGNOSIS — M6281 Muscle weakness (generalized): Secondary | ICD-10-CM

## 2016-03-27 DIAGNOSIS — Z8673 Personal history of transient ischemic attack (TIA), and cerebral infarction without residual deficits: Secondary | ICD-10-CM

## 2016-03-27 DIAGNOSIS — Z7982 Long term (current) use of aspirin: Secondary | ICD-10-CM

## 2016-03-27 DIAGNOSIS — I350 Nonrheumatic aortic (valve) stenosis: Secondary | ICD-10-CM | POA: Diagnosis present

## 2016-03-27 DIAGNOSIS — G8929 Other chronic pain: Secondary | ICD-10-CM | POA: Diagnosis present

## 2016-03-27 DIAGNOSIS — R7989 Other specified abnormal findings of blood chemistry: Secondary | ICD-10-CM

## 2016-03-27 DIAGNOSIS — K219 Gastro-esophageal reflux disease without esophagitis: Secondary | ICD-10-CM | POA: Diagnosis present

## 2016-03-27 DIAGNOSIS — Z79899 Other long term (current) drug therapy: Secondary | ICD-10-CM

## 2016-03-27 DIAGNOSIS — R0902 Hypoxemia: Secondary | ICD-10-CM | POA: Diagnosis present

## 2016-03-27 DIAGNOSIS — R7881 Bacteremia: Secondary | ICD-10-CM

## 2016-03-27 DIAGNOSIS — H409 Unspecified glaucoma: Secondary | ICD-10-CM | POA: Diagnosis present

## 2016-03-27 DIAGNOSIS — E785 Hyperlipidemia, unspecified: Secondary | ICD-10-CM | POA: Diagnosis present

## 2016-03-27 DIAGNOSIS — I110 Hypertensive heart disease with heart failure: Secondary | ICD-10-CM | POA: Diagnosis present

## 2016-03-27 DIAGNOSIS — M109 Gout, unspecified: Secondary | ICD-10-CM | POA: Diagnosis present

## 2016-03-27 HISTORY — DX: Nonrheumatic aortic (valve) stenosis: I35.0

## 2016-03-27 HISTORY — DX: Chronic diastolic (congestive) heart failure: I50.32

## 2016-03-27 LAB — CBC WITH DIFFERENTIAL/PLATELET
Basophils Absolute: 0 10*3/uL (ref 0–0.1)
Basophils Relative: 0 %
EOS ABS: 0.1 10*3/uL (ref 0–0.7)
EOS PCT: 1 %
HCT: 28.5 % — ABNORMAL LOW (ref 35.0–47.0)
HEMOGLOBIN: 10 g/dL — AB (ref 12.0–16.0)
LYMPHS ABS: 0.6 10*3/uL — AB (ref 1.0–3.6)
Lymphocytes Relative: 6 %
MCH: 31.4 pg (ref 26.0–34.0)
MCHC: 35.1 g/dL (ref 32.0–36.0)
MCV: 89.5 fL (ref 80.0–100.0)
MONO ABS: 0.6 10*3/uL (ref 0.2–0.9)
MONOS PCT: 7 %
Neutro Abs: 8.3 10*3/uL — ABNORMAL HIGH (ref 1.4–6.5)
Neutrophils Relative %: 86 %
PLATELETS: 180 10*3/uL (ref 150–440)
RBC: 3.19 MIL/uL — ABNORMAL LOW (ref 3.80–5.20)
RDW: 14.6 % — AB (ref 11.5–14.5)
WBC: 9.6 10*3/uL (ref 3.6–11.0)

## 2016-03-27 LAB — COMPREHENSIVE METABOLIC PANEL
ALBUMIN: 2.9 g/dL — AB (ref 3.5–5.0)
ALK PHOS: 40 U/L (ref 38–126)
ALT: 20 U/L (ref 14–54)
AST: 56 U/L — ABNORMAL HIGH (ref 15–41)
Anion gap: 7 (ref 5–15)
BUN: 17 mg/dL (ref 6–20)
CALCIUM: 8.6 mg/dL — AB (ref 8.9–10.3)
CO2: 29 mmol/L (ref 22–32)
Chloride: 102 mmol/L (ref 101–111)
Creatinine, Ser: 0.95 mg/dL (ref 0.44–1.00)
GFR calc Af Amer: 60 mL/min — ABNORMAL LOW (ref >60)
GFR calc non Af Amer: 52 mL/min — ABNORMAL LOW (ref >60)
GLUCOSE: 90 mg/dL (ref 65–99)
Potassium: 3.5 mmol/L (ref 3.5–5.1)
SODIUM: 138 mmol/L (ref 135–145)
Total Bilirubin: 0.8 mg/dL (ref 0.3–1.2)
Total Protein: 6.4 g/dL — ABNORMAL LOW (ref 6.5–8.1)

## 2016-03-27 LAB — INFLUENZA PANEL BY PCR (TYPE A & B)
INFLAPCR: NEGATIVE
Influenza B By PCR: NEGATIVE

## 2016-03-27 LAB — LACTIC ACID, PLASMA: Lactic Acid, Venous: 0.9 mmol/L (ref 0.5–1.9)

## 2016-03-27 LAB — TROPONIN I: Troponin I: 1.64 ng/mL (ref <0.03)

## 2016-03-27 MED ORDER — SODIUM CHLORIDE 0.9 % IV SOLN
Freq: Once | INTRAVENOUS | Status: AC
  Administered 2016-03-27: 22:00:00 via INTRAVENOUS

## 2016-03-27 MED ORDER — IOPAMIDOL (ISOVUE-300) INJECTION 61%
100.0000 mL | Freq: Once | INTRAVENOUS | Status: AC | PRN
  Administered 2016-03-27: 100 mL via INTRAVENOUS

## 2016-03-27 NOTE — ED Notes (Signed)
Attempt x2 to in and out pt for urine sample. Pt has a vaginal prolapse that is not reducable. Not able to insert urinary catheter more than 0.25 inch. md notified by iris, rn.

## 2016-03-27 NOTE — ED Triage Notes (Signed)
Pt has had back pain x 3 days - has been in bed since. Unknown how long she has had fever.

## 2016-03-27 NOTE — Telephone Encounter (Signed)
Family suspect lasix and K Dur was causing back pain and bilateral toe pain. She also has fatigue but no SOB. I doubt her symptom is caused by the medication. I am ok with her holding the med for 1 day, but advised her to discuss with her PCP or urgent care.   Hilbert Corrigan PA Pager: [phone]

## 2016-03-27 NOTE — Telephone Encounter (Signed)
Daughter paged after hour answering service complaining of multiple issues such as extreme weakness, back pain, foot pain. She has severe aortic stenosis and recently her hemoglobin dropped by 2 units. Her daughters worried about her anemia, although her hemoglobin is not severely low at this time. I am not sure was causing her severe weakness, her daughter says she is unable to get up and that is a new change for her. I have advised her to seek medical attention in the ED.  Hilbert Corrigan PA Pager: [phone]

## 2016-03-27 NOTE — ED Provider Notes (Signed)
[facility] Emergency Department Provider Note    First MD Initiated Contact with Patient 03/27/16 2109     (approximate)  I have reviewed the triage vital signs and the nursing notes.   HISTORY  Chief Complaint Back Pain and Fever    HPI [name] is a 83 y.o. female with Chief complaint of back pain for the past 2 days as well as fever. Family is with patient at bedside states that she has had a nonproductive cough. Did not check any fevers at home. Since that she has been more frail with decreased oral intake. Patient complaining of right-sided flank pain without any radiation. No nausea or vomiting. She denies any chest pain or shortness of breath.   Past Medical History:  Diagnosis Date  . Anemia   . Aortic stenosis    a. echo 08/2014: EF 55-60%, no RWMA, GR1DD, severe AS, mild AI, Mean gradient (S): 38 mm Hg. Valve area (VTI): 0.78 cm^2., mild-mod MR, LA mildly dilated, atrial septal aneurysm, mild TR  . Bone disease   . Carotid arterial disease ()    a. 08/2014 U/S: Bilateral < 50% stneosis. Patent vertebrals w/ antegrade flow.   . Cervical spine arthritis ([place])   . Cognitive impairment   . Depression   . Diabetes mellitus without complication ([place])   . Diastolic dysfunction    a. echo 2014: EF 55-60%, DD, mild LVH, moderate AS;  b. 08/2014 Echo: EF 55-60%, no RWMA, GR1DD, severe AS, mild AI, Mean gradient (S): 38 mm Hg. Valve area (VTI): 0.78 cm^2., mild-mod MR, LA mildly dilated, atrial septal aneurysm, mild TR  . Essential hypertension   . GERD (gastroesophageal reflux disease)   . Glaucoma   . Gout   . Hiatal hernia   . History of renal impairment   . Hyperlipidemia   . Hypotension    a. Related to Norvasc - 11/2014 ED visit.  . Multinodular goiter    a. Noted incidentally on CT 07/2012 and carotid U/S 08/2014;  b. Nl TSH 11/2014.  Marland Kitchen Paroxysmal SVT (supraventricular tachycardia) (HCC)    a. on Toprol   . Prolapsed uterus   .  Stroke [facility])    a. CT head 07/2014 showed small thalamic infarct  . Vitamin deficiency    Family History  Problem Relation Age of Onset  . Glaucoma Mother   . Diabetes Mother   . Peptic Ulcer Father   . Colitis Father    Past Surgical History:  Procedure Laterality Date  . TUBAL LIGATION     Patient Active Problem List   Diagnosis Date Noted  . Leg swelling 09/14/2015  . Essential hypertension   . Hyperlipidemia   . Diabetes mellitus without complication ([place])   . Paroxysmal SVT (supraventricular tachycardia) ([place])   . Multinodular goiter   . Carotid arterial disease ([place])   . Hypotension   . Aortic stenosis   . NSTEMI (non-ST elevated myocardial infarction) ([place]) 09/10/2014  . Renal failure 08/28/2014  . SVT (supraventricular tachycardia) ([name]) 12/17/2012  . Diabetes mellitus, type 2 ([place]) 12/17/2012  . SOB (shortness of breath) 12/17/2012  . Essential hypertension, malignant 12/17/2012  . Chronic diastolic CHF (congestive heart failure) ([place]) 12/17/2012      Prior to Admission medications   Medication Sig Start Date End Date Taking? Authorizing Provider  allopurinol (ZYLOPRIM) 300 MG tablet Take 300 mg by mouth daily.   Yes Historical Provider, MD  aspirin 81 MG tablet Take 81 mg  by mouth daily.   Yes Historical Provider, MD  benzonatate (TESSALON) 100 MG capsule Take 200 mg by mouth 3 (three) times daily as needed for cough.   Yes Historical Provider, MD  brimonidine (ALPHAGAN P) 0.1 % SOLN Place 1 drop into the right eye 2 (two) times daily.   Yes Historical Provider, MD  cholecalciferol (VITAMIN D) 1000 UNITS tablet Take 2,000 Units by mouth daily.    Yes Historical Provider, MD  Dorzolamide HCl-Timolol Mal (COSOPT OP) Place 1 drop into the right eye 2 (two) times daily.    Yes Historical Provider, MD  doxazosin (CARDURA) 4 MG tablet Take 1 tablet (4 mg total) by mouth 2 (two) times daily. 09/14/15  Yes Minna Merritts, MD  furosemide (LASIX) 20 MG tablet Take 1  tablet (20 mg total) by mouth daily. 03/23/16 04/22/16 Yes Minna Merritts, MD  gabapentin (NEURONTIN) 300 MG capsule Take 300 mg by mouth 2 (two) times daily.   Yes Historical Provider, MD  glimepiride (AMARYL) 1 MG tablet Take 1 mg by mouth daily with breakfast.   Yes Historical Provider, MD  hydrALAZINE (APRESOLINE) 50 MG tablet Take 1 tablet (50 mg total) by mouth 3 (three) times daily. Patient taking differently: Take 25 mg by mouth 3 (three) times daily.  01/14/16 04/13/16 Yes Minna Merritts, MD  iron polysaccharides (NIFEREX) 150 MG capsule Take 150 mg by mouth 2 (two) times daily.   Yes Historical Provider, MD  isosorbide mononitrate (IMDUR) 60 MG 24 hr tablet Take 1 tablet (60 mg total) by mouth 2 (two) times daily. 12/31/15 12/30/16 Yes Minna Merritts, MD  latanoprost (XALATAN) 0.005 % ophthalmic solution Place 1 drop into the right eye at bedtime. 07/03/14  Yes Historical Provider, MD  lisinopril (PRINIVIL,ZESTRIL) 40 MG tablet Take 40 mg by mouth at bedtime.   Yes Historical Provider, MD  lovastatin (MEVACOR) 40 MG tablet Take 40 mg by mouth at bedtime.   Yes Historical Provider, MD  metoprolol succinate (TOPROL-XL) 25 MG 24 hr tablet Take 3 tablets (75 mg total) by mouth daily. Take with or immediately following a meal. 09/12/14  Yes Vaughan Basta, MD  pantoprazole (PROTONIX) 40 MG tablet Take 40 mg by mouth 2 (two) times daily.   Yes Historical Provider, MD  pilocarpine (PILOCAR) 4 % ophthalmic solution Place 1 drop into the right eye 4 (four) times daily.    Yes Historical Provider, MD  potassium chloride (K-DUR) 10 MEQ tablet Take 1 tablet (10 mEq total) by mouth daily. Take with lasix 03/23/16 04/22/16 Yes Minna Merritts, MD  tiZANidine (ZANAFLEX) 4 MG tablet Take 1 tablet by mouth 3 (three) times daily as needed. 08/08/14  Yes Historical Provider, MD    Allergies Metformin and related    Social History Social History  Substance Use Topics  . Smoking status: Never Smoker   . Smokeless tobacco: Never Used  . Alcohol use No    Review of Systems Patient denies headaches, rhinorrhea, blurry vision, numbness, shortness of breath, chest pain, edema, cough, abdominal pain, nausea, vomiting, diarrhea, dysuria, fevers, rashes or hallucinations unless otherwise stated above in HPI. ____________________________________________   PHYSICAL EXAM:  VITAL SIGNS: Vitals:   03/27/16 2330 03/28/16 0000  BP: (!) 158/71 (!) 163/71  Pulse: 95 99  Resp: 19 20  Temp:  99.5 F (37.5 C)    Constitutional: Alert and oriented. ill appearing but in no acute distress. Eyes: Conjunctivae are normal. PERRL. EOMI. Head: Atraumatic. Nose: No congestion/rhinnorhea. Mouth/Throat: Mucous  membranes are moist.  Oropharynx non-erythematous. Neck: No stridor. Painless ROM. No cervical spine tenderness to palpation Hematological/Lymphatic/Immunilogical: No cervical lymphadenopathy. Cardiovascular: Normal rate, regular rhythm. Grossly normal heart sounds.  Good peripheral circulation. Respiratory: Normal respiratory effort.  No retractions. Lungs CTAB. Gastrointestinal: Soft and nontender. No distention. No abdominal bruits. + right flank CVA tenderness. Gu:  Rectal prolapse, no hemorrhage Musculoskeletal: No lower extremity tenderness nor edema.  No joint effusions. Neurologic:  Normal speech and language. No gross focal neurologic deficits are appreciated. No gait instability. Skin:   No rash noted.  ____________________________________________   LABS (all labs ordered are listed, but only abnormal results are displayed)  Results for orders placed or performed during the hospital encounter of 03/27/16 (from the past 24 hour(s))  Lactic acid, plasma     Status: None   Collection Time: 03/27/16  9:37 PM  Result Value Ref Range   Lactic Acid, Venous 0.9 0.5 - 1.9 mmol/L  Comprehensive metabolic panel     Status: Abnormal   Collection Time: 03/27/16  9:38 PM  Result Value Ref  Range   Sodium 138 135 - 145 mmol/L   Potassium 3.5 3.5 - 5.1 mmol/L   Chloride 102 101 - 111 mmol/L   CO2 29 22 - 32 mmol/L   Glucose, Bld 90 65 - 99 mg/dL   BUN 17 6 - 20 mg/dL   Creatinine, Ser 0.95 0.44 - 1.00 mg/dL   Calcium 8.6 (L) 8.9 - 10.3 mg/dL   Total Protein 6.4 (L) 6.5 - 8.1 g/dL   Albumin 2.9 (L) 3.5 - 5.0 g/dL   AST 56 (H) 15 - 41 U/L   ALT 20 14 - 54 U/L   Alkaline Phosphatase 40 38 - 126 U/L   Total Bilirubin 0.8 0.3 - 1.2 mg/dL   GFR calc non Af Amer 52 (L) >60 mL/min   GFR calc Af Amer 60 (L) >60 mL/min   Anion gap 7 5 - 15  Troponin I     Status: Abnormal   Collection Time: 03/27/16  9:38 PM  Result Value Ref Range   Troponin I 1.64 (HH) <0.03 ng/mL  CBC WITH DIFFERENTIAL     Status: Abnormal   Collection Time: 03/27/16  9:38 PM  Result Value Ref Range   WBC 9.6 3.6 - 11.0 K/uL   RBC 3.19 (L) 3.80 - 5.20 MIL/uL   Hemoglobin 10.0 (L) 12.0 - 16.0 g/dL   HCT 28.5 (L) 35.0 - 47.0 %   MCV 89.5 80.0 - 100.0 fL   MCH 31.4 26.0 - 34.0 pg   MCHC 35.1 32.0 - 36.0 g/dL   RDW 14.6 (H) 11.5 - 14.5 %   Platelets 180 150 - 440 K/uL   Neutrophils Relative % 86 %   Neutro Abs 8.3 (H) 1.4 - 6.5 K/uL   Lymphocytes Relative 6 %   Lymphs Abs 0.6 (L) 1.0 - 3.6 K/uL   Monocytes Relative 7 %   Monocytes Absolute 0.6 0.2 - 0.9 K/uL   Eosinophils Relative 1 %   Eosinophils Absolute 0.1 0 - 0.7 K/uL   Basophils Relative 0 %   Basophils Absolute 0.0 0 - 0.1 K/uL  Urinalysis, Complete w Microscopic     Status: Abnormal   Collection Time: 03/27/16  9:38 PM  Result Value Ref Range   Color, Urine YELLOW (A) YELLOW   APPearance CLEAR (A) CLEAR   Specific Gravity, Urine 1.030 1.005 - 1.030   pH 5.0 5.0 - 8.0   Glucose,  UA NEGATIVE NEGATIVE mg/dL   Hgb urine dipstick NEGATIVE NEGATIVE   Bilirubin Urine NEGATIVE NEGATIVE   Ketones, ur 5 (A) NEGATIVE mg/dL   Protein, ur NEGATIVE NEGATIVE mg/dL   Nitrite NEGATIVE NEGATIVE   Leukocytes, UA NEGATIVE NEGATIVE   RBC / HPF 0-5 0 -  5 RBC/hpf   WBC, UA 0-5 0 - 5 WBC/hpf   Bacteria, UA RARE (A) NONE SEEN   Squamous Epithelial / LPF 0-5 (A) NONE SEEN   Mucous PRESENT   Influenza panel by PCR (type A & B)     Status: None   Collection Time: 03/27/16  9:38 PM  Result Value Ref Range   Influenza A By PCR NEGATIVE NEGATIVE   Influenza B By PCR NEGATIVE NEGATIVE   ____________________________________________  EKG My review and personal interpretation at Time: 21:19   Indication: back pain  Rate: 90  Rhythm: sinus Axis: normal Other: normal intervals, non specific st changes, no STEMI ____________________________________________  RADIOLOGY  I personally reviewed all radiographic images ordered to evaluate for the above acute complaints and reviewed radiology reports and findings.  These findings were personally discussed with the patient.  Please see medical record for radiology report.  ____________________________________________   PROCEDURES  Procedure(s) performed:  Procedures    Critical Care performed: yes CRITICAL CARE Performed by: Merlyn Lot   Total critical care time: 45 minutes  Critical care time was exclusive of separately billable procedures and treating other patients.  Critical care was necessary to treat or prevent imminent or life-threatening deterioration.  Critical care was time spent personally by me on the following activities: development of treatment plan with patient and/or surrogate as well as nursing, discussions with consultants, evaluation of patient's response to treatment, examination of patient, obtaining history from patient or surrogate, ordering and performing treatments and interventions, ordering and review of laboratory studies, ordering and review of radiographic studies, pulse oximetry and re-evaluation of patient's condition.   ____________________________________________   INITIAL IMPRESSION / ASSESSMENT AND PLAN / ED COURSE  Pertinent labs & imaging  results that were available during my care of the patient were reviewed by me and considered in my medical decision making (see chart for details).  DDX: Dehydration, sepsis, pna, uti, hypoglycemia,  drug effect, acs   Alyzah Barton Green is a 83 y.o. who presents to the ED with complaint of back pain and fever. Patient febrile and mildly tachycardic but normotensive. Patient is ill-appearing but also chronically ill. Her abdominal exam is soft and benign. Work shows no significant leukocytosis but does have a left shift. Patient does appear dehydrated. Chest x-ray ordered to evaluate for pneumonia shows no infiltrate and the patient does not have any acute hypoxia at this time. Flu was checked and was negative. I do suppose pneumonia could also still be possible and we are simply sitting the early clinical manifestations. Patient with a rectal prolapse with very difficult anatomy which limited ability to obtain a clean catheter urine sample. Clean catch urine was obtained and the patient clinically did appear to have cloudy and turbid urine concerning for UTI. In the setting of her flank pain and concern for pyelonephritis. CT imaging shows no evidence of diverticulitis or stone. Urinalysis presently without any significant infection but is concentrated further supporting dehydration. Her EKG does not show any evidence of acute ischemia but the patient has elevated troponin 1.6. Patient denies any chest pain or pressure at this time therefore I feel elevations likely secondary to dehydration in the setting  of known cardiac disease. Patient denies any previous spinal procedures and there is no history of IV drug abuse therefore a lower suspicion for processes such as epidural abscess or osteomyelitis and CT imaging did not show any significant osseous abnormality in the T or L spine. Broad spectrum antibiotics started to empirically cover for sepsis. Patient remains hemodynamically stable at this time but will  need admission to the hospital for further evaluation and management.  Have discussed with the patient and available family all diagnostics and treatments performed thus far and all questions were answered to the best of my ability. The patient demonstrates understanding and agreement with plan.       ____________________________________________   FINAL CLINICAL IMPRESSION(S) / ED DIAGNOSES  Final diagnoses:  Sepsis, due to unspecified organism ([place])  Elevated troponin  Acute right-sided low back pain without sciatica      NEW MEDICATIONS STARTED DURING THIS VISIT:  New Prescriptions   No medications on file     Note:  This document was prepared using Dragon voice recognition software and may include unintentional dictation errors.    Merlyn Lot, MD 03/28/16 [phone]

## 2016-03-28 ENCOUNTER — Inpatient Hospital Stay: Payer: Medicare HMO

## 2016-03-28 DIAGNOSIS — R7881 Bacteremia: Secondary | ICD-10-CM | POA: Diagnosis not present

## 2016-03-28 DIAGNOSIS — Z888 Allergy status to other drugs, medicaments and biological substances status: Secondary | ICD-10-CM | POA: Diagnosis not present

## 2016-03-28 DIAGNOSIS — R0902 Hypoxemia: Secondary | ICD-10-CM | POA: Diagnosis present

## 2016-03-28 DIAGNOSIS — Z7982 Long term (current) use of aspirin: Secondary | ICD-10-CM | POA: Diagnosis not present

## 2016-03-28 DIAGNOSIS — A4181 Sepsis due to Enterococcus: Secondary | ICD-10-CM | POA: Diagnosis present

## 2016-03-28 DIAGNOSIS — M5440 Lumbago with sciatica, unspecified side: Secondary | ICD-10-CM | POA: Diagnosis not present

## 2016-03-28 DIAGNOSIS — G8929 Other chronic pain: Secondary | ICD-10-CM | POA: Diagnosis present

## 2016-03-28 DIAGNOSIS — Z9851 Tubal ligation status: Secondary | ICD-10-CM | POA: Diagnosis not present

## 2016-03-28 DIAGNOSIS — J189 Pneumonia, unspecified organism: Secondary | ICD-10-CM | POA: Diagnosis present

## 2016-03-28 DIAGNOSIS — I110 Hypertensive heart disease with heart failure: Secondary | ICD-10-CM | POA: Diagnosis present

## 2016-03-28 DIAGNOSIS — Z833 Family history of diabetes mellitus: Secondary | ICD-10-CM | POA: Diagnosis not present

## 2016-03-28 DIAGNOSIS — I248 Other forms of acute ischemic heart disease: Secondary | ICD-10-CM | POA: Diagnosis present

## 2016-03-28 DIAGNOSIS — I350 Nonrheumatic aortic (valve) stenosis: Secondary | ICD-10-CM

## 2016-03-28 DIAGNOSIS — I872 Venous insufficiency (chronic) (peripheral): Secondary | ICD-10-CM | POA: Diagnosis present

## 2016-03-28 DIAGNOSIS — M545 Low back pain: Secondary | ICD-10-CM | POA: Diagnosis present

## 2016-03-28 DIAGNOSIS — Z8673 Personal history of transient ischemic attack (TIA), and cerebral infarction without residual deficits: Secondary | ICD-10-CM | POA: Diagnosis not present

## 2016-03-28 DIAGNOSIS — M1990 Unspecified osteoarthritis, unspecified site: Secondary | ICD-10-CM | POA: Diagnosis present

## 2016-03-28 DIAGNOSIS — Z79899 Other long term (current) drug therapy: Secondary | ICD-10-CM | POA: Diagnosis not present

## 2016-03-28 DIAGNOSIS — I5032 Chronic diastolic (congestive) heart failure: Secondary | ICD-10-CM | POA: Diagnosis present

## 2016-03-28 DIAGNOSIS — K219 Gastro-esophageal reflux disease without esophagitis: Secondary | ICD-10-CM | POA: Diagnosis present

## 2016-03-28 DIAGNOSIS — E119 Type 2 diabetes mellitus without complications: Secondary | ICD-10-CM | POA: Diagnosis present

## 2016-03-28 DIAGNOSIS — D1809 Hemangioma of other sites: Secondary | ICD-10-CM | POA: Diagnosis present

## 2016-03-28 DIAGNOSIS — H409 Unspecified glaucoma: Secondary | ICD-10-CM | POA: Diagnosis present

## 2016-03-28 DIAGNOSIS — Z83511 Family history of glaucoma: Secondary | ICD-10-CM | POA: Diagnosis not present

## 2016-03-28 DIAGNOSIS — R748 Abnormal levels of other serum enzymes: Secondary | ICD-10-CM

## 2016-03-28 DIAGNOSIS — M109 Gout, unspecified: Secondary | ICD-10-CM | POA: Diagnosis present

## 2016-03-28 DIAGNOSIS — J069 Acute upper respiratory infection, unspecified: Secondary | ICD-10-CM | POA: Diagnosis present

## 2016-03-28 DIAGNOSIS — E785 Hyperlipidemia, unspecified: Secondary | ICD-10-CM | POA: Diagnosis present

## 2016-03-28 DIAGNOSIS — A419 Sepsis, unspecified organism: Secondary | ICD-10-CM | POA: Diagnosis not present

## 2016-03-28 LAB — URINALYSIS, COMPLETE (UACMP) WITH MICROSCOPIC
BILIRUBIN URINE: NEGATIVE
GLUCOSE, UA: NEGATIVE mg/dL
HGB URINE DIPSTICK: NEGATIVE
KETONES UR: 5 mg/dL — AB
LEUKOCYTES UA: NEGATIVE
Nitrite: NEGATIVE
PROTEIN: NEGATIVE mg/dL
Specific Gravity, Urine: 1.03 (ref 1.005–1.030)
pH: 5 (ref 5.0–8.0)

## 2016-03-28 LAB — BLOOD CULTURE ID PANEL (REFLEXED)
Acinetobacter baumannii: NOT DETECTED
CANDIDA TROPICALIS: NOT DETECTED
Candida albicans: NOT DETECTED
Candida glabrata: NOT DETECTED
Candida krusei: NOT DETECTED
Candida parapsilosis: NOT DETECTED
ENTEROCOCCUS SPECIES: DETECTED — AB
ESCHERICHIA COLI: NOT DETECTED
Enterobacter cloacae complex: NOT DETECTED
Enterobacteriaceae species: NOT DETECTED
Haemophilus influenzae: NOT DETECTED
Klebsiella oxytoca: NOT DETECTED
Klebsiella pneumoniae: NOT DETECTED
LISTERIA MONOCYTOGENES: NOT DETECTED
Neisseria meningitidis: NOT DETECTED
PROTEUS SPECIES: NOT DETECTED
Pseudomonas aeruginosa: NOT DETECTED
SERRATIA MARCESCENS: NOT DETECTED
STAPHYLOCOCCUS SPECIES: NOT DETECTED
Staphylococcus aureus (BCID): NOT DETECTED
Streptococcus agalactiae: NOT DETECTED
Streptococcus pneumoniae: NOT DETECTED
Streptococcus pyogenes: NOT DETECTED
Streptococcus species: NOT DETECTED
VANCOMYCIN RESISTANCE: NOT DETECTED

## 2016-03-28 LAB — GLUCOSE, CAPILLARY
Glucose-Capillary: 130 mg/dL — ABNORMAL HIGH (ref 65–99)
Glucose-Capillary: 107 mg/dL — ABNORMAL HIGH (ref 65–99)
Glucose-Capillary: 215 mg/dL — ABNORMAL HIGH (ref 65–99)
Glucose-Capillary: 165 mg/dL — ABNORMAL HIGH (ref 65–99)

## 2016-03-28 LAB — MRSA PCR SCREENING: MRSA by PCR: NEGATIVE

## 2016-03-28 LAB — CULTURE, BLOOD (ROUTINE X 2)

## 2016-03-28 LAB — TSH: TSH: 0.681 u[IU]/mL (ref 0.350–4.500)

## 2016-03-28 LAB — TROPONIN I
Troponin I: 1.09 ng/mL (ref <0.03)
Troponin I: 0.76 ng/mL (ref <0.03)
Troponin I: 0.69 ng/mL (ref <0.03)

## 2016-03-28 MED ORDER — LATANOPROST 0.005 % OP SOLN
1.0000 [drp] | Freq: Every day | OPHTHALMIC | Status: DC
  Administered 2016-03-28 – 2016-03-31 (×4): 1 [drp] via OPHTHALMIC
  Filled 2016-03-28: qty 2.5

## 2016-03-28 MED ORDER — INSULIN ASPART 100 UNIT/ML ~~LOC~~ SOLN: 0.0000 [IU] | Freq: Every day | SUBCUTANEOUS | Status: DC

## 2016-03-28 MED ORDER — ACETAMINOPHEN 325 MG PO TABS: 650.0000 mg | ORAL_TABLET | Freq: Four times a day (QID) | ORAL | Status: DC | PRN

## 2016-03-28 MED ORDER — MORPHINE SULFATE (PF) 4 MG/ML IV SOLN
1.0000 mg | INTRAVENOUS | Status: DC | PRN
  Administered 2016-03-28 (×2): 1 mg via INTRAVENOUS
  Administered 2016-03-28 – 2016-03-29 (×3): 2 mg via INTRAVENOUS
  Administered 2016-03-29 – 2016-03-30 (×2): 1 mg via INTRAVENOUS
  Administered 2016-03-31 (×3): 2 mg via INTRAVENOUS
  Filled 2016-03-28 (×10): qty 1

## 2016-03-28 MED ORDER — TRAMADOL HCL 50 MG PO TABS
50.0000 mg | ORAL_TABLET | Freq: Four times a day (QID) | ORAL | Status: DC | PRN
  Administered 2016-03-28: 50 mg via ORAL
  Filled 2016-03-28: qty 1

## 2016-03-28 MED ORDER — TRAMADOL HCL 50 MG PO TABS
50.0000 mg | ORAL_TABLET | Freq: Four times a day (QID) | ORAL | Status: DC | PRN
  Administered 2016-03-28 – 2016-03-30 (×3): 50 mg via ORAL
  Filled 2016-03-28 (×3): qty 1

## 2016-03-28 MED ORDER — METOPROLOL SUCCINATE ER 25 MG PO TB24
75.0000 mg | ORAL_TABLET | Freq: Every day | ORAL | Status: DC
  Administered 2016-03-28 – 2016-04-01 (×4): 75 mg via ORAL
  Filled 2016-03-28 (×4): qty 3

## 2016-03-28 MED ORDER — ISOSORBIDE MONONITRATE ER 60 MG PO TB24
60.0000 mg | ORAL_TABLET | Freq: Two times a day (BID) | ORAL | Status: DC
  Administered 2016-03-28 – 2016-04-01 (×8): 60 mg via ORAL
  Filled 2016-03-28 (×9): qty 1

## 2016-03-28 MED ORDER — BRIMONIDINE TARTRATE 0.15 % OP SOLN
1.0000 [drp] | Freq: Two times a day (BID) | OPHTHALMIC | Status: DC
  Administered 2016-03-28 – 2016-04-01 (×7): 1 [drp] via OPHTHALMIC
  Filled 2016-03-28: qty 5

## 2016-03-28 MED ORDER — FUROSEMIDE 20 MG PO TABS
20.0000 mg | ORAL_TABLET | Freq: Every day | ORAL | Status: DC
Start: 2016-03-28 — End: 2016-04-02
  Administered 2016-03-28 – 2016-04-01 (×4): 20 mg via ORAL
  Filled 2016-03-28 (×4): qty 1

## 2016-03-28 MED ORDER — LISINOPRIL 20 MG PO TABS
40.0000 mg | ORAL_TABLET | Freq: Every day | ORAL | Status: DC
  Administered 2016-03-28 – 2016-03-31 (×4): 40 mg via ORAL
  Filled 2016-03-28 (×4): qty 2

## 2016-03-28 MED ORDER — GABAPENTIN 300 MG PO CAPS
300.0000 mg | ORAL_CAPSULE | Freq: Two times a day (BID) | ORAL | Status: DC
  Administered 2016-03-28 – 2016-04-01 (×8): 300 mg via ORAL
  Filled 2016-03-28 (×8): qty 1

## 2016-03-28 MED ORDER — POTASSIUM CHLORIDE CRYS ER 10 MEQ PO TBCR
10.0000 meq | EXTENDED_RELEASE_TABLET | Freq: Every day | ORAL | Status: DC
  Administered 2016-03-28 – 2016-04-01 (×4): 10 meq via ORAL
  Filled 2016-03-28 (×5): qty 1

## 2016-03-28 MED ORDER — ONDANSETRON HCL 4 MG/2ML IJ SOLN: 4.0000 mg | Freq: Four times a day (QID) | INTRAMUSCULAR | Status: DC | PRN

## 2016-03-28 MED ORDER — BENZONATATE 100 MG PO CAPS
200.0000 mg | ORAL_CAPSULE | Freq: Three times a day (TID) | ORAL | Status: DC | PRN
  Administered 2016-03-29: 200 mg via ORAL
  Filled 2016-03-28: qty 2

## 2016-03-28 MED ORDER — DORZOLAMIDE HCL-TIMOLOL MAL 2-0.5 % OP SOLN
1.0000 [drp] | Freq: Two times a day (BID) | OPHTHALMIC | Status: DC
  Filled 2016-03-28: qty 10

## 2016-03-28 MED ORDER — ASPIRIN 81 MG PO CHEW
81.0000 mg | CHEWABLE_TABLET | Freq: Every day | ORAL | Status: DC
  Administered 2016-03-28 – 2016-04-01 (×4): 81 mg via ORAL
  Filled 2016-03-28 (×4): qty 1

## 2016-03-28 MED ORDER — VITAMIN D 1000 UNITS PO TABS
2000.0000 [IU] | ORAL_TABLET | Freq: Every day | ORAL | Status: DC
  Administered 2016-03-28 – 2016-04-01 (×4): 2000 [IU] via ORAL
  Filled 2016-03-28 (×4): qty 2

## 2016-03-28 MED ORDER — PANTOPRAZOLE SODIUM 40 MG PO TBEC
40.0000 mg | DELAYED_RELEASE_TABLET | Freq: Two times a day (BID) | ORAL | Status: DC
  Administered 2016-03-28 – 2016-04-01 (×8): 40 mg via ORAL
  Filled 2016-03-28 (×9): qty 1

## 2016-03-28 MED ORDER — PILOCARPINE HCL 4 % OP SOLN
1.0000 [drp] | Freq: Four times a day (QID) | OPHTHALMIC | Status: DC
  Administered 2016-03-28 – 2016-04-01 (×17): 1 [drp] via OPHTHALMIC
  Filled 2016-03-28: qty 15

## 2016-03-28 MED ORDER — TIMOLOL MALEATE 0.25 % OP SOLN
1.0000 [drp] | Freq: Two times a day (BID) | OPHTHALMIC | Status: DC
  Administered 2016-03-28 – 2016-04-01 (×8): 1 [drp] via OPHTHALMIC
  Filled 2016-03-28: qty 5

## 2016-03-28 MED ORDER — INSULIN ASPART 100 UNIT/ML ~~LOC~~ SOLN
0.0000 [IU] | Freq: Three times a day (TID) | SUBCUTANEOUS | Status: DC
  Administered 2016-03-28: 5 [IU] via SUBCUTANEOUS
  Administered 2016-03-29: 3 [IU] via SUBCUTANEOUS
  Administered 2016-03-29: 2 [IU] via SUBCUTANEOUS
  Administered 2016-03-30 (×2): 8 [IU] via SUBCUTANEOUS
  Administered 2016-03-30: 3 [IU] via SUBCUTANEOUS
  Administered 2016-03-31: 5 [IU] via SUBCUTANEOUS
  Administered 2016-03-31 – 2016-04-01 (×2): 2 [IU] via SUBCUTANEOUS
  Administered 2016-04-01: 3 [IU] via SUBCUTANEOUS
  Administered 2016-04-01: 11 [IU] via SUBCUTANEOUS
  Filled 2016-03-28: qty 2
  Filled 2016-03-28 (×2): qty 8
  Filled 2016-03-28 (×2): qty 3
  Filled 2016-03-28: qty 2
  Filled 2016-03-28: qty 3
  Filled 2016-03-28: qty 2
  Filled 2016-03-28: qty 5
  Filled 2016-03-28: qty 11
  Filled 2016-03-28: qty 5
  Filled 2016-03-28: qty 3

## 2016-03-28 MED ORDER — HYDRALAZINE HCL 25 MG PO TABS
25.0000 mg | ORAL_TABLET | Freq: Three times a day (TID) | ORAL | Status: DC
  Administered 2016-03-28 – 2016-04-01 (×9): 25 mg via ORAL
  Filled 2016-03-28 (×13): qty 1

## 2016-03-28 MED ORDER — SODIUM CHLORIDE 0.9% FLUSH
3.0000 mL | Freq: Two times a day (BID) | INTRAVENOUS | Status: DC
  Administered 2016-03-28 – 2016-04-01 (×9): 3 mL via INTRAVENOUS

## 2016-03-28 MED ORDER — DOXAZOSIN MESYLATE 4 MG PO TABS
4.0000 mg | ORAL_TABLET | Freq: Two times a day (BID) | ORAL | Status: DC
  Administered 2016-03-28 – 2016-04-01 (×8): 4 mg via ORAL
  Filled 2016-03-28 (×8): qty 1

## 2016-03-28 MED ORDER — SODIUM CHLORIDE 0.9 % IV BOLUS (SEPSIS)
1000.0000 mL | Freq: Once | INTRAVENOUS | Status: AC
  Administered 2016-03-28: 1000 mL via INTRAVENOUS

## 2016-03-28 MED ORDER — ACETAMINOPHEN 650 MG RE SUPP: 650.0000 mg | Freq: Four times a day (QID) | RECTAL | Status: DC | PRN

## 2016-03-28 MED ORDER — LIDOCAINE 5 % EX PTCH
1.0000 | MEDICATED_PATCH | CUTANEOUS | Status: DC
  Administered 2016-03-28 – 2016-04-01 (×4): 1 via TRANSDERMAL
  Filled 2016-03-28 (×6): qty 1

## 2016-03-28 MED ORDER — DOCUSATE SODIUM 100 MG PO CAPS
100.0000 mg | ORAL_CAPSULE | Freq: Two times a day (BID) | ORAL | Status: DC
  Administered 2016-03-28 – 2016-04-01 (×8): 100 mg via ORAL
  Filled 2016-03-28 (×8): qty 1

## 2016-03-28 MED ORDER — ONDANSETRON HCL 4 MG PO TABS: 4.0000 mg | ORAL_TABLET | Freq: Four times a day (QID) | ORAL | Status: DC | PRN

## 2016-03-28 MED ORDER — VANCOMYCIN HCL IN DEXTROSE 1-5 GM/200ML-% IV SOLN
1000.0000 mg | Freq: Once | INTRAVENOUS | Status: AC
  Administered 2016-03-28: 1000 mg via INTRAVENOUS
  Filled 2016-03-28: qty 200

## 2016-03-28 MED ORDER — ALLOPURINOL 300 MG PO TABS
300.0000 mg | ORAL_TABLET | Freq: Every day | ORAL | Status: DC
  Administered 2016-03-28 – 2016-04-01 (×4): 300 mg via ORAL
  Filled 2016-03-28 (×4): qty 1

## 2016-03-28 MED ORDER — PRAVASTATIN SODIUM 40 MG PO TABS
40.0000 mg | ORAL_TABLET | Freq: Every day | ORAL | Status: DC
  Administered 2016-03-28 – 2016-04-01 (×5): 40 mg via ORAL
  Filled 2016-03-28 (×5): qty 1

## 2016-03-28 MED ORDER — CEFEPIME-DEXTROSE 2 GM/50ML IV SOLR
2.0000 g | Freq: Once | INTRAVENOUS | Status: AC
  Administered 2016-03-28: 2 g via INTRAVENOUS
  Filled 2016-03-28: qty 50

## 2016-03-28 MED ORDER — ENOXAPARIN SODIUM 40 MG/0.4ML ~~LOC~~ SOLN
40.0000 mg | SUBCUTANEOUS | Status: DC
  Administered 2016-03-28 – 2016-03-30 (×3): 40 mg via SUBCUTANEOUS
  Filled 2016-03-28 (×3): qty 0.4

## 2016-03-28 MED ORDER — DOPAMINE-DEXTROSE 3.2-5 MG/ML-% IV SOLN: 0.0000 ug/kg/min | INTRAVENOUS | Status: DC

## 2016-03-28 MED ORDER — DEXTROSE 5 % IV SOLN
2.0000 g | Freq: Two times a day (BID) | INTRAVENOUS | Status: DC
  Administered 2016-03-28 – 2016-03-29 (×3): 2 g via INTRAVENOUS
  Filled 2016-03-28 (×5): qty 2

## 2016-03-28 MED ORDER — TIZANIDINE HCL 4 MG PO TABS: 4.0000 mg | ORAL_TABLET | Freq: Three times a day (TID) | ORAL | Status: DC | PRN

## 2016-03-28 MED ORDER — POLYSACCHARIDE IRON COMPLEX 150 MG PO CAPS
150.0000 mg | ORAL_CAPSULE | Freq: Two times a day (BID) | ORAL | Status: DC
  Administered 2016-03-28 – 2016-04-01 (×8): 150 mg via ORAL
  Filled 2016-03-28 (×8): qty 1

## 2016-03-28 MED ORDER — VANCOMYCIN HCL IN DEXTROSE 1-5 GM/200ML-% IV SOLN
1000.0000 mg | INTRAVENOUS | Status: DC
  Administered 2016-03-28 – 2016-03-29 (×2): 1000 mg via INTRAVENOUS
  Filled 2016-03-28 (×3): qty 200

## 2016-03-28 MED ORDER — TROLAMINE SALICYLATE 10 % EX CREA
TOPICAL_CREAM | Freq: Two times a day (BID) | CUTANEOUS | Status: DC | PRN
  Administered 2016-03-28 – 2016-03-30 (×2): 1 via TOPICAL
  Filled 2016-03-28: qty 85

## 2016-03-28 MED ORDER — METAXALONE 800 MG PO TABS
400.0000 mg | ORAL_TABLET | Freq: Three times a day (TID) | ORAL | Status: AC
  Administered 2016-03-28 – 2016-03-31 (×8): 400 mg via ORAL
  Filled 2016-03-28 (×9): qty 0.5

## 2016-03-28 MED ORDER — DORZOLAMIDE HCL 2 % OP SOLN
1.0000 [drp] | Freq: Two times a day (BID) | OPHTHALMIC | Status: DC
  Administered 2016-03-28 – 2016-04-01 (×8): 1 [drp] via OPHTHALMIC
  Filled 2016-03-28: qty 10

## 2016-03-28 NOTE — H&P (Addendum)
[name] is an 83 y.o. female.   Chief Complaint: Back pain HPI: The patient with past medical history of aortic stenosis, hypertension, SVT, stroke and vertebral arthritis presents to the emergency department complaining of back pain. The patient states the pain is in the middle of her back and has worsened significantly over the last 3 days. However, her back pain is chronic to some degree. Usually she is very active and walks without assistance. However, over the last few days she has been too weak to ambulate without help. She also mentions cough over the last 3 weeks. Upon arrival to the emergency department the patient had a fever of 100.84F, but chest x-ray showed no consolidation or infiltrate. The patient remained tachypneic. Laboratory evaluation revealed elevated troponin as well which prompted the emergency department staff to call the hospitalist service for admission.  Past Medical History:  Diagnosis Date  . Anemia   . Aortic stenosis    a. echo 08/2014: EF 55-60%, no RWMA, GR1DD, severe AS, mild AI, Mean gradient (S): 38 mm Hg. Valve area (VTI): 0.78 cm^2., mild-mod MR, LA mildly dilated, atrial septal aneurysm, mild TR  . Bone disease   . Carotid arterial disease ([place])    a. 08/2014 U/S: Bilateral < 50% stneosis. Patent vertebrals w/ antegrade flow.   . Cervical spine arthritis ([place])   . Cognitive impairment   . Depression   . Diabetes mellitus without complication ([place])   . Diastolic dysfunction    a. echo 2014: EF 55-60%, DD, mild LVH, moderate AS;  b. 08/2014 Echo: EF 55-60%, no RWMA, GR1DD, severe AS, mild AI, Mean gradient (S): 38 mm Hg. Valve area (VTI): 0.78 cm^2., mild-mod MR, LA mildly dilated, atrial septal aneurysm, mild TR  . Essential hypertension   . GERD (gastroesophageal reflux disease)   . Glaucoma   . Gout   . Hiatal hernia   . History of renal impairment   . Hyperlipidemia   . Hypotension    a. Related to Norvasc - 11/2014 ED visit.  . Multinodular  goiter    a. Noted incidentally on CT 07/2012 and carotid U/S 08/2014;  b. Nl TSH 11/2014.  Marland Kitchen Paroxysmal SVT (supraventricular tachycardia) ([facility])    a. on Toprol   . Prolapsed uterus   . Stroke [facility])    a. CT head 07/2014 showed small thalamic infarct  . Vitamin deficiency     Past Surgical History:  Procedure Laterality Date  . TUBAL LIGATION      Family History  Problem Relation Age of Onset  . Glaucoma Mother   . Diabetes Mother   . Peptic Ulcer Father   . Colitis Father    Social History:  reports that she has never smoked. She has never used smokeless tobacco. She reports that she does not drink alcohol or use drugs.  Allergies:  Allergies  Allergen Reactions  . Metformin And Related     Medications Prior to Admission  Medication Sig Dispense Refill  . allopurinol (ZYLOPRIM) 300 MG tablet Take 300 mg by mouth daily.    Marland Kitchen aspirin 81 MG tablet Take 81 mg by mouth daily.    . benzonatate (TESSALON) 100 MG capsule Take 200 mg by mouth 3 (three) times daily as needed for cough.    . brimonidine (ALPHAGAN P) 0.1 % SOLN Place 1 drop into the right eye 2 (two) times daily.    . cholecalciferol (VITAMIN D) 1000 UNITS tablet Take 2,000 Units by mouth daily.     Marland Kitchen  Dorzolamide HCl-Timolol Mal (COSOPT OP) Place 1 drop into the right eye 2 (two) times daily.     Marland Kitchen doxazosin (CARDURA) 4 MG tablet Take 1 tablet (4 mg total) by mouth 2 (two) times daily. 60 tablet 11  . furosemide (LASIX) 20 MG tablet Take 1 tablet (20 mg total) by mouth daily. 30 tablet 6  . gabapentin (NEURONTIN) 300 MG capsule Take 300 mg by mouth 2 (two) times daily.    Marland Kitchen glimepiride (AMARYL) 1 MG tablet Take 1 mg by mouth daily with breakfast.    . hydrALAZINE (APRESOLINE) 50 MG tablet Take 1 tablet (50 mg total) by mouth 3 (three) times daily. (Patient taking differently: Take 25 mg by mouth 3 (three) times daily. ) 270 tablet 3  . iron polysaccharides (NIFEREX) 150 MG capsule Take 150 mg by mouth 2 (two) times  daily.    . isosorbide mononitrate (IMDUR) 60 MG 24 hr tablet Take 1 tablet (60 mg total) by mouth 2 (two) times daily. 60 tablet 6  . latanoprost (XALATAN) 0.005 % ophthalmic solution Place 1 drop into the right eye at bedtime.  3  . lisinopril (PRINIVIL,ZESTRIL) 40 MG tablet Take 40 mg by mouth at bedtime.    . lovastatin (MEVACOR) 40 MG tablet Take 40 mg by mouth at bedtime.    . metoprolol succinate (TOPROL-XL) 25 MG 24 hr tablet Take 3 tablets (75 mg total) by mouth daily. Take with or immediately following a meal. 120 tablet 0  . pantoprazole (PROTONIX) 40 MG tablet Take 40 mg by mouth 2 (two) times daily.    . pilocarpine (PILOCAR) 4 % ophthalmic solution Place 1 drop into the right eye 4 (four) times daily.     . potassium chloride (K-DUR) 10 MEQ tablet Take 1 tablet (10 mEq total) by mouth daily. Take with lasix 30 tablet 6  . tiZANidine (ZANAFLEX) 4 MG tablet Take 1 tablet by mouth 3 (three) times daily as needed.  1    Results for orders placed or performed during the hospital encounter of 03/27/16 (from the past 48 hour(s))  Lactic acid, plasma     Status: None   Collection Time: 03/27/16  9:37 PM  Result Value Ref Range   Lactic Acid, Venous 0.9 0.5 - 1.9 mmol/L  Comprehensive metabolic panel     Status: Abnormal   Collection Time: 03/27/16  9:38 PM  Result Value Ref Range   Sodium 138 135 - 145 mmol/L   Potassium 3.5 3.5 - 5.1 mmol/L   Chloride 102 101 - 111 mmol/L   CO2 29 22 - 32 mmol/L   Glucose, Bld 90 65 - 99 mg/dL   BUN 17 6 - 20 mg/dL   Creatinine, Ser 0.95 0.44 - 1.00 mg/dL   Calcium 8.6 (L) 8.9 - 10.3 mg/dL   Total Protein 6.4 (L) 6.5 - 8.1 g/dL   Albumin 2.9 (L) 3.5 - 5.0 g/dL   AST 56 (H) 15 - 41 U/L   ALT 20 14 - 54 U/L   Alkaline Phosphatase 40 38 - 126 U/L   Total Bilirubin 0.8 0.3 - 1.2 mg/dL   GFR calc non Af Amer 52 (L) >60 mL/min   GFR calc Af Amer 60 (L) >60 mL/min    Comment: (NOTE) The eGFR has been calculated using the CKD EPI equation. This  calculation has not been validated in all clinical situations. eGFR's persistently <60 mL/min signify possible Chronic Kidney Disease.    Anion gap 7 5 - 15  Troponin I     Status: Abnormal   Collection Time: 03/27/16  9:38 PM  Result Value Ref Range   Troponin I 1.64 (HH) <0.03 ng/mL    Comment: CRITICAL RESULT CALLED TO, READ BACK BY AND VERIFIED WITH SUSAN NEAL AT 2222 03/27/16.PMH  CBC WITH DIFFERENTIAL     Status: Abnormal   Collection Time: 03/27/16  9:38 PM  Result Value Ref Range   WBC 9.6 3.6 - 11.0 K/uL   RBC 3.19 (L) 3.80 - 5.20 MIL/uL   Hemoglobin 10.0 (L) 12.0 - 16.0 g/dL   HCT 28.5 (L) 35.0 - 47.0 %   MCV 89.5 80.0 - 100.0 fL   MCH 31.4 26.0 - 34.0 pg   MCHC 35.1 32.0 - 36.0 g/dL   RDW 14.6 (H) 11.5 - 14.5 %   Platelets 180 150 - 440 K/uL   Neutrophils Relative % 86 %   Neutro Abs 8.3 (H) 1.4 - 6.5 K/uL   Lymphocytes Relative 6 %   Lymphs Abs 0.6 (L) 1.0 - 3.6 K/uL   Monocytes Relative 7 %   Monocytes Absolute 0.6 0.2 - 0.9 K/uL   Eosinophils Relative 1 %   Eosinophils Absolute 0.1 0 - 0.7 K/uL   Basophils Relative 0 %   Basophils Absolute 0.0 0 - 0.1 K/uL  Urinalysis, Complete w Microscopic     Status: Abnormal   Collection Time: 03/27/16  9:38 PM  Result Value Ref Range   Color, Urine YELLOW (A) YELLOW   APPearance CLEAR (A) CLEAR   Specific Gravity, Urine 1.030 1.005 - 1.030   pH 5.0 5.0 - 8.0   Glucose, UA NEGATIVE NEGATIVE mg/dL   Hgb urine dipstick NEGATIVE NEGATIVE   Bilirubin Urine NEGATIVE NEGATIVE   Ketones, ur 5 (A) NEGATIVE mg/dL   Protein, ur NEGATIVE NEGATIVE mg/dL   Nitrite NEGATIVE NEGATIVE   Leukocytes, UA NEGATIVE NEGATIVE   RBC / HPF 0-5 0 - 5 RBC/hpf   WBC, UA 0-5 0 - 5 WBC/hpf   Bacteria, UA RARE (A) NONE SEEN   Squamous Epithelial / LPF 0-5 (A) NONE SEEN   Mucous PRESENT   Influenza panel by PCR (type A & B)     Status: None   Collection Time: 03/27/16  9:38 PM  Result Value Ref Range   Influenza A By PCR NEGATIVE NEGATIVE    Influenza B By PCR NEGATIVE NEGATIVE    Comment: (NOTE) The Xpert Xpress Flu assay is intended as an aid in the diagnosis of  influenza and should not be used as a sole basis for treatment.  This  assay is FDA approved for nasopharyngeal swab specimens only. Nasal  washings and aspirates are unacceptable for Xpert Xpress Flu testing.   TSH     Status: None   Collection Time: 03/28/16  4:44 AM  Result Value Ref Range   TSH 0.681 0.350 - 4.500 uIU/mL    Comment: Performed by a 3rd Generation assay with a functional sensitivity of <=0.01 uIU/mL.  Troponin I     Status: Abnormal   Collection Time: 03/28/16  4:44 AM  Result Value Ref Range   Troponin I 1.09 (HH) <0.03 ng/mL    Comment: CRITICAL VALUE NOTED. VALUE IS CONSISTENT WITH PREVIOUSLY REPORTED/CALLED VALUE.PMH   Ct Abdomen Pelvis W Contrast  Result Date: 03/27/2016 CLINICAL DATA:  Back pain 3 days.  Fever. EXAM: CT ABDOMEN AND PELVIS WITH CONTRAST TECHNIQUE: Multidetector CT imaging of the abdomen and pelvis was performed using the standard protocol  following bolus administration of intravenous contrast. CONTRAST:  165m ISOVUE-300 IOPAMIDOL (ISOVUE-300) INJECTION 61% COMPARISON:  08/28/2014 FINDINGS: Lower chest: Examination demonstrates mild left basilar atelectatic change. There are a few small bilateral nodular densities unchanged. Mild cardiomegaly. Calcified plaque over the coronary arteries and thoracic aorta. Hepatobiliary: 2 small subcentimeter hypodensities over the right lobe of the liver too small to characterize but likely cysts. Gallbladder and biliary tree are within normal. Pancreas: Within normal. Spleen: Within normal. Adrenals/Urinary Tract: Adrenal glands are within normal. Kidneys are normal in size, shape and position without hydronephrosis or nephrolithiasis. Visualize ureters are within normal. There is laxity of the pelvic floor unchanged with suggestion of a cystocele as the bladder is otherwise unremarkable.  Stomach/Bowel: Small hiatal hernia. Small bowel is within normal. Appendix is normal. There is moderate to severe diverticulosis throughout the colon. Vascular/Lymphatic: Mild-to-moderate calcified plaque over the abdominal aorta as well as at the takeoff of the renal arteries and also involving the iliac arteries. Remaining vascular structures are unremarkable. No evidence of adenopathy. Reproductive: Evidence of patient's known uterine prolapse with significant pelvic floor laxity unchanged. Other: No free fluid or focal inflammatory change. Musculoskeletal: Moderate degenerative changes spine and mild degenerative change of the hips. IMPRESSION: No acute findings in the abdomen/pelvis. Severe diverticulosis throughout the colon without active inflammation. Moderate pelvic floor laxity with evidence of known uterine prolapse as well as cystocele unchanged. Two adjacent subcentimeter liver hypodensities too small to characterize but likely cysts. Aortic atherosclerosis. Atherosclerotic coronary artery disease. Small hiatal hernia. Electronically Signed   By: DMarin OlpM.D.   On: 03/27/2016 23:14   Dg Chest Port 1 View  Result Date: 03/27/2016 CLINICAL DATA:  Fever with back pain.  Concern for pneumonia. EXAM: PORTABLE CHEST 1 VIEW COMPARISON:  Frontal and lateral views 01/23/2016 FINDINGS: Stable heart size and mediastinal contours, mild cardiomegaly appears stable. There is thoracic aortic atherosclerosis. No confluent airspace disease. No evidence of pulmonary edema, pleural fluid or pneumothorax. Eventration of right hemidiaphragm is unchanged. There is degenerative change throughout the thoracic spine. IMPRESSION: 1. No evidence of pneumonia on portable AP view. No acute abnormality. 2. Stable mild cardiomegaly.  Thoracic aortic atherosclerosis. Electronically Signed   By: MJeb LeveringM.D.   On: 03/27/2016 21:54    Review of Systems  Constitutional: Negative for chills and fever.  HENT:  Negative for sore throat and tinnitus.   Eyes: Negative for blurred vision and redness.  Respiratory: Positive for cough. Negative for shortness of breath.   Cardiovascular: Negative for chest pain, palpitations, orthopnea and PND.  Gastrointestinal: Negative for abdominal pain, diarrhea, nausea and vomiting.  Genitourinary: Negative for dysuria, frequency and urgency.  Musculoskeletal: Positive for back pain. Negative for joint pain and myalgias.  Skin: Negative for rash.       No lesions  Neurological: Positive for weakness. Negative for speech change and focal weakness.  Endo/Heme/Allergies: Does not bruise/bleed easily.       No temperature intolerance  Psychiatric/Behavioral: Negative for depression and suicidal ideas.    Blood pressure (!) 139/56, pulse 86, temperature 100 F (37.8 C), temperature source Oral, resp. rate 18, height 5' (1.524 m), weight 70.9 kg (156 lb 3.2 oz), SpO2 100 %. Physical Exam  Vitals reviewed. Constitutional: She is oriented to person, place, and time. She appears well-developed and well-nourished. No distress.  HENT:  Head: Normocephalic and atraumatic.  Mouth/Throat: Oropharynx is clear and moist.  Eyes: Conjunctivae and EOM are normal. Pupils are equal, round, and reactive to  light. No scleral icterus.  Neck: Normal range of motion. Neck supple. No JVD present. No tracheal deviation present. No thyromegaly present.  Cardiovascular: Normal rate, regular rhythm and normal heart sounds.  Exam reveals no gallop and no friction rub.   No murmur heard. Respiratory: Effort normal and breath sounds normal.  GI: Soft. Bowel sounds are normal. She exhibits no distension. There is no tenderness.  Genitourinary:  Genitourinary Comments: Deferred  Musculoskeletal: Normal range of motion. She exhibits no edema.  Lymphadenopathy:    She has no cervical adenopathy.  Neurological: She is alert and oriented to person, place, and time. No cranial nerve deficit. She  exhibits normal muscle tone.  Skin: Skin is warm and dry. No rash noted. No erythema.  Psychiatric: She has a normal mood and affect. Her behavior is normal. Judgment and thought content normal.     Assessment/Plan This is an 83 year old female admitted for pneumonia. 1. Pneumonia: Community-acquired; present clinically although chest x-ray is clear. Mild hypoxia present at times but renal function is good. The patient is no longer febrile. Initially the patient met criteria for sepsis and was treated with broad-spectrum antibiotics. Follow blood cultures for growth and sensitivities although I suspect we may be able to narrow antibiotic coverage for community-acquired organisms. 2. Sepsis: The patient initially met criteria via fever and tachypnea. She is hemodynamically stable. 3. Elevated troponin: Etiology is likely multifactorial including sepsis and/or demand ischemia secondary to her severe aortic stenosis or SVT not captured on telemetry which may also have caused what the patient perceives as back pain. Continue Imdur Continue to follow troponin and monitor telemetry. 4. Essential hypertension: Acceptable for age; continue Cardura, lisinopril and metoprolol area the patient is on Lasix likely due to venous insufficiency. She does not have a history of CHF. 5. Diabetes mellitus type II: Sliding scale insulin while hospitalized 6. Hyperlipidemia: Continue statin therapy 7. Glaucoma: Continue eyedrops 8. DVT prophylaxis: Lovenox 9. GI prophylaxis: Pantoprazole per home regimen The patient is a full code. Time spent on admission orders and patient care approximately 45 minutes  Harrie Foreman, MD 03/28/2016, 7:38 AM

## 2016-03-28 NOTE — Progress Notes (Signed)
PT Cancellation Note  Patient Details Name: [name] MRN: [id number] DOB: September 22, 1924   Cancelled Treatment:    Reason Eval/Treat Not Completed: Other (comment).  Pt pending x-ray of thoracic spine (per notes pt is having intense pain).  Will hold PT at this time and re-attempt PT eval at a later date/time once imaging is complete (and results are known) and pt is appropriate to participate in PT.   Leitha Bleak 03/28/2016, 1:59 PM Leitha Bleak, [place]

## 2016-03-28 NOTE — ED Notes (Signed)
Pt assisted with bedpan by tech.

## 2016-03-28 NOTE — Progress Notes (Signed)
[place] [facility] [place] NAME: [name]    MR#:  [id number]  DATE OF BIRTH:  21-Jul-1924  SUBJECTIVE:  CHIEF COMPLAINT:   Chief Complaint  Patient presents with  . Back Pain  . Fever   - Patient complains of significant low back pain and unable to even move in bed. Lumbar spine x-rays negative so far - daughter at bedside.  REVIEW OF SYSTEMS:  Review of Systems  Constitutional: Positive for fever and malaise/fatigue. Negative for chills.  HENT: Negative for congestion, ear discharge, hearing loss and nosebleeds.   Eyes: Negative for blurred vision and double vision.  Respiratory: Positive for cough. Negative for shortness of breath and wheezing.   Cardiovascular: Negative for chest pain, palpitations and leg swelling.  Gastrointestinal: Negative for abdominal pain, constipation, diarrhea, nausea and vomiting.  Genitourinary: Negative for dysuria.  Musculoskeletal: Positive for back pain.  Neurological: Negative for dizziness, speech change, focal weakness, seizures and headaches.  Psychiatric/Behavioral: Negative for depression.    DRUG ALLERGIES:   Allergies  Allergen Reactions  . Metformin And Related     VITALS:  Blood pressure (!) 144/60, pulse 83, temperature 99.3 F (37.4 C), temperature source Oral, resp. rate 18, height 5' (1.524 m), weight 70.9 kg (156 lb 3.2 oz), SpO2 98 %.  PHYSICAL EXAMINATION:  Physical Exam  GENERAL:  83 y.o.-year-old patient lying in the bed and appears to be in distress secondary to back pain  EYES: Pupils equal, round, reactive to light and accommodation. No scleral icterus. Extraocular muscles intact.  HEENT: Head atraumatic, normocephalic. Oropharynx and nasopharynx clear.  NECK:  Supple, no jugular venous distention. No thyroid enlargement, no tenderness.  LUNGS: Normal breath sounds bilaterally, no wheezing, rales,rhonchi or crepitation. No use of accessory muscles of respiration.  Decreased bibasilar breath sounds CARDIOVASCULAR: S1, S2 normal. No  rubs, or gallops. 3/6 systolic murmur is present ABDOMEN: Soft, nontender, nondistended. Bowel sounds present. No organomegaly or mass.  Complains of lower back pain, no radiation of pain down the legs and no neurological deficits noted EXTREMITIES: No pedal edema, cyanosis, or clubbing.  NEUROLOGIC: Cranial nerves II through XII are intact. Muscle strength 5/5 in all extremities. Sensation intact. Gait not checked.  PSYCHIATRIC: The patient is alert and oriented x 3.  SKIN: No obvious rash, lesion, or ulcer.    LABORATORY PANEL:   CBC  Recent Labs Lab 03/27/16 2138  WBC 9.6  HGB 10.0*  HCT 28.5*  PLT 180   ------------------------------------------------------------------------------------------------------------------  Chemistries   Recent Labs Lab 03/27/16 2138  NA 138  K 3.5  CL 102  CO2 29  GLUCOSE 90  BUN 17  CREATININE 0.95  CALCIUM 8.6*  AST 56*  ALT 20  ALKPHOS 40  BILITOT 0.8   ------------------------------------------------------------------------------------------------------------------  Cardiac Enzymes  Recent Labs Lab 03/28/16 1148  TROPONINI 0.76*   ------------------------------------------------------------------------------------------------------------------  RADIOLOGY:  Dg Lumbar Spine 2-3 Views  Result Date: 03/28/2016 CLINICAL DATA:  Low back pain and progressive weakness EXAM: LUMBAR SPINE - 3 VIEW COMPARISON:  03/27/2016 FINDINGS: Five lumbar type vertebral bodies are well visualized. Vertebral body height is well maintained. A scoliosis is noted centered at the L1-2 level concave to the right. Multilevel osteophytic changes are seen. No anterolisthesis is noted. Multilevel disc space narrowing is noted. Diffuse aortic calcifications are noted without aneurysmal dilatation. The bladder is well distended with contrast from recent CT. IMPRESSION: Multilevel degenerative  change similar to that seen on prior CT examination. No  acute abnormality is noted. Electronically Signed   By: Inez Catalina M.D.   On: 03/28/2016 10:33   Ct Abdomen Pelvis W Contrast  Result Date: 03/27/2016 CLINICAL DATA:  Back pain 3 days.  Fever. EXAM: CT ABDOMEN AND PELVIS WITH CONTRAST TECHNIQUE: Multidetector CT imaging of the abdomen and pelvis was performed using the standard protocol following bolus administration of intravenous contrast. CONTRAST:  158mL ISOVUE-300 IOPAMIDOL (ISOVUE-300) INJECTION 61% COMPARISON:  08/28/2014 FINDINGS: Lower chest: Examination demonstrates mild left basilar atelectatic change. There are a few small bilateral nodular densities unchanged. Mild cardiomegaly. Calcified plaque over the coronary arteries and thoracic aorta. Hepatobiliary: 2 small subcentimeter hypodensities over the right lobe of the liver too small to characterize but likely cysts. Gallbladder and biliary tree are within normal. Pancreas: Within normal. Spleen: Within normal. Adrenals/Urinary Tract: Adrenal glands are within normal. Kidneys are normal in size, shape and position without hydronephrosis or nephrolithiasis. Visualize ureters are within normal. There is laxity of the pelvic floor unchanged with suggestion of a cystocele as the bladder is otherwise unremarkable. Stomach/Bowel: Small hiatal hernia. Small bowel is within normal. Appendix is normal. There is moderate to severe diverticulosis throughout the colon. Vascular/Lymphatic: Mild-to-moderate calcified plaque over the abdominal aorta as well as at the takeoff of the renal arteries and also involving the iliac arteries. Remaining vascular structures are unremarkable. No evidence of adenopathy. Reproductive: Evidence of patient's known uterine prolapse with significant pelvic floor laxity unchanged. Other: No free fluid or focal inflammatory change. Musculoskeletal: Moderate degenerative changes spine and mild degenerative change of the hips.  IMPRESSION: No acute findings in the abdomen/pelvis. Severe diverticulosis throughout the colon without active inflammation. Moderate pelvic floor laxity with evidence of known uterine prolapse as well as cystocele unchanged. Two adjacent subcentimeter liver hypodensities too small to characterize but likely cysts. Aortic atherosclerosis. Atherosclerotic coronary artery disease. Small hiatal hernia. Electronically Signed   By: Marin Olp M.D.   On: 03/27/2016 23:14   Dg Chest Port 1 View  Result Date: 03/27/2016 CLINICAL DATA:  Fever with back pain.  Concern for pneumonia. EXAM: PORTABLE CHEST 1 VIEW COMPARISON:  Frontal and lateral views 01/23/2016 FINDINGS: Stable heart size and mediastinal contours, mild cardiomegaly appears stable. There is thoracic aortic atherosclerosis. No confluent airspace disease. No evidence of pulmonary edema, pleural fluid or pneumothorax. Eventration of right hemidiaphragm is unchanged. There is degenerative change throughout the thoracic spine. IMPRESSION: 1. No evidence of pneumonia on portable AP view. No acute abnormality. 2. Stable mild cardiomegaly.  Thoracic aortic atherosclerosis. Electronically Signed   By: Jeb Levering M.D.   On: 03/27/2016 21:54    EKG:   Orders placed or performed during the hospital encounter of 03/27/16  . EKG 12-Lead  . EKG 12-Lead  . ED EKG 12-Lead  . ED EKG 12-Lead    ASSESSMENT AND PLAN:   83 year old female with multiple medical problems including severe stenosis, arthritis, depression, diabetes, hypertension and history of stroke presents to hospital secondary to worsening back pain.  #1 back pain-acute onset on chronic pain. In the lower back region but lumbar x-rays negative. -No neurological deficits noted on exam. Thoracic spine x-ray has been ordered. -Pain medications and muscle relaxants ordered along with Lidoderm patch. -If no improvement, will do a MRI of thoracic and lumbar spines especially with her  fevers.  #2 elevated troponin-likely demand ischemia, also has severe aortic stenosis. -Appreciate cardiology input. No further testing at this time. Continue cardiac medications.  #3 pneumonia-not noted on x-ray or  CT of her abdomen. -Continue to follow up cultures. If remains afebrile and cultures are negative, we'll discontinue antibiotics tomorrow.  #4 hypertension-continue Lasix, oral hydralazine, Imdur, lisinopril and metoprolol.  #5 diastolic CHF-appears stable. Continue low-dose Lasix daily  #6 DVT prophylaxis-on Lovenox.  Physical therapy consult once pain is improved    All the records are reviewed and case discussed with Care Management/Social Workerr. Management plans discussed with the patient, family and they are in agreement.  CODE STATUS: Full Code  TOTAL TIME TAKING CARE OF THIS PATIENT: 37 minutes.   POSSIBLE D/C IN 2 DAYS, DEPENDING ON CLINICAL CONDITION.   Gladstone Lighter M.D on 03/28/2016 at 2:14 PM  Between 7am to 6pm - Pager - [phone]  After 6pm go to [email or web address] - password [email or web address] [name]  Office  [phone]  CC: Primary care physician; [name], MD

## 2016-03-28 NOTE — Progress Notes (Signed)
PHARMACY - PHYSICIAN COMMUNICATION CRITICAL VALUE ALERT - BLOOD CULTURE IDENTIFICATION (BCID)  Results for orders placed or performed during the hospital encounter of 03/27/16  Blood Culture ID Panel (Reflexed) (Collected: 03/27/2016 11:10 PM)  Result Value Ref Range   Enterococcus species DETECTED (A) NOT DETECTED   Vancomycin resistance NOT DETECTED NOT DETECTED   Listeria monocytogenes NOT DETECTED NOT DETECTED   Staphylococcus species NOT DETECTED NOT DETECTED   Staphylococcus aureus NOT DETECTED NOT DETECTED   Streptococcus species NOT DETECTED NOT DETECTED   Streptococcus agalactiae NOT DETECTED NOT DETECTED   Streptococcus pneumoniae NOT DETECTED NOT DETECTED   Streptococcus pyogenes NOT DETECTED NOT DETECTED   Acinetobacter baumannii NOT DETECTED NOT DETECTED   Enterobacteriaceae species NOT DETECTED NOT DETECTED   Enterobacter cloacae complex NOT DETECTED NOT DETECTED   Escherichia coli NOT DETECTED NOT DETECTED   Klebsiella oxytoca NOT DETECTED NOT DETECTED   Klebsiella pneumoniae NOT DETECTED NOT DETECTED   Proteus species NOT DETECTED NOT DETECTED   Serratia marcescens NOT DETECTED NOT DETECTED   Haemophilus influenzae NOT DETECTED NOT DETECTED   Neisseria meningitidis NOT DETECTED NOT DETECTED   Pseudomonas aeruginosa NOT DETECTED NOT DETECTED   Candida albicans NOT DETECTED NOT DETECTED   Candida glabrata NOT DETECTED NOT DETECTED   Candida krusei NOT DETECTED NOT DETECTED   Candida parapsilosis NOT DETECTED NOT DETECTED   Candida tropicalis NOT DETECTED NOT DETECTED    Name of physician (or Provider) Contacted: Sainani   Changes to prescribed antibiotics required: No , will continue pt on Vancomycin  Yaritzel Stange D 03/28/2016  7:48 PM

## 2016-03-28 NOTE — Care Management (Signed)
Presents from home with back pain.  Admitted with CAP.  Current oxygen is acute. Patient has been experiencing progressive weakness.  She does have significant aortic stenosis PT consult is pending. Cardiology has consulted.  Elevation in troponin thought to be due to demand.  No issues accessing medical care, obtaining meds current with pcp.

## 2016-03-28 NOTE — ED Notes (Signed)
maxipime verified with lea ferguson, rn at bedside. Medication when scanning rang "wrong medication", however the medication was verified by this rn and ferguson, rn as the correct medication.

## 2016-03-28 NOTE — Consult Note (Signed)
Cardiology Consult    Patient ID: [name] MRN: [id number], DOB/AGE: 81-Nov-1926   Admit date: 03/27/2016 Date of Consult: 03/28/2016  Primary Physician: [name], MD Primary Cardiologist: Johnny Bridge, MD  Requesting Provider: Claria Dice, MD  Patient Profile    83 y/o ? with a h/o severe AS, PSVT, HTN, HL, DM, stroke, diast chf, gout, gerd, and OA, who was admitted 1/29 with diffuse body tenderness, progressive wkns, and fever.  Past Medical History   Past Medical History:  Diagnosis Date  . Anemia   . Bone disease   . Carotid arterial disease ([place])    a. 08/2014 U/S: Bilateral < 50% stneosis. Patent vertebrals w/ antegrade flow.   . Cervical spine arthritis ([place])   . Chronic diastolic CHF (congestive heart failure) ([place])    a. echo 2014: EF 55-60%, DD;  b. 08/2014 Echo: EF 55-60%, no RWMA, GR1DD; c. 02/2016 Echo: EF 65-70%, Gr1 DD.  Marland Kitchen Cognitive impairment   . Depression   . Diabetes mellitus without complication ([place])   . Essential hypertension   . GERD (gastroesophageal reflux disease)   . Glaucoma   . Gout   . Hiatal hernia   . History of renal impairment   . Hyperlipidemia   . Hypotension    a. Related to Norvasc - 11/2014 ED visit.  . Multinodular goiter    a. Noted incidentally on CT 07/2012 and carotid U/S 08/2014;  b. Nl TSH 11/2014.  Marland Kitchen Paroxysmal SVT (supraventricular tachycardia) ([facility])    a. on Toprol   . Prolapsed uterus   . Severe aortic stenosis    a. echo 08/2014: EF 55-60%, no RWMA, GR1DD, severe AS, mild AI, Mean gradient (S): 38 mm Hg. Valve area (VTI): 0.78 cm^2., mild-mod MR, LA mildly dilated, atrial septal aneurysm, mild TR;  b. 02/2016 Echo: EF 65-70%, Gr1 DD, sev AS, mild to mod AI, Valve area (VTI): 0.94 cm^2, (Vmax) 0.96 cm^2, (Vmean) 0.93cm^2, mild MR, mildly dil LA, nl RV fxn, PASP 7mmHg.  . Stroke [facility])    a. CT head 07/2014 showed small thalamic infarct  . Vitamin deficiency     Past Surgical History:  Procedure Laterality Date    . TUBAL LIGATION       Allergies  Allergies  Allergen Reactions  . Metformin And Related     History of Present Illness    82 y/o ? with a h/o severe AS, PSVT, HTN, HL, DM, stroke, diast chf, gout, gerd, and OA.  She lives locally with her dtr and is generally sedentary.  Her dtr notes that over the past few mos, she has had less and less energy.  She recent saw Dr. Rockey Situ and was noted to have some increase in lower ext edema, largely felt to be 2/2 venous insufficiency.  Echo was ordered and showed nl LV fxn, gr 1 DD, and severe AS.  Filling pressures were elevated and she was Rx lasix 20 bid x 3 days and then 20 daily.  She was also referred to TAVR clinic.  Since starting lasix, she has had improvement in lower ext edema.  She denies any recent h/o dyspnea, chest pain, palpitations, pnd, orthopnea, n, v, dizziness, syncope, or early satiety.  Beginning on 1/28, she began to c/o diffuse body tenderness and severe back pain.  This was worse with any position changes.  Her dtr also noted that she was more weak, or unwilling to ambulate 2/2 pain.  As such, her dtr called our answering  service on the afternoon of 1/28 and was advised to present to the ED.  There, ECG was non-acute.  CXR was w/o acute findings.  She was febrile however.  She was initially treated w/ broad spectrum abx in the setting of possible sepsis.  Troponin was elevatd @ 1.64.  She was admitted for further eval.  Since admission, she says that she feels a little better.  She remains weak. She has had no further fevers and she denies chest pain, palpitations, dyspnea, pnd, orthopnea, n, v, dizziness, syncope, edema, weight gain, or early satiety. Troponin fell to 1.09 this AM.  Inpatient Medications    . allopurinol  300 mg Oral Daily  . aspirin  81 mg Oral Daily  . brimonidine  1 drop Right Eye BID  . ceFEPime (MAXIPIME) IV  2 g Intravenous Q12H  . cholecalciferol  2,000 Units Oral Daily  . docusate sodium  100 mg Oral  BID  . dorzolamide  1 drop Right Eye BID   And  . timolol  1 drop Right Eye BID  . doxazosin  4 mg Oral BID  . enoxaparin (LOVENOX) injection  40 mg Subcutaneous Q24H  . furosemide  20 mg Oral Daily  . gabapentin  300 mg Oral BID  . hydrALAZINE  25 mg Oral TID  . insulin aspart  0-15 Units Subcutaneous TID WC  . insulin aspart  0-5 Units Subcutaneous QHS  . iron polysaccharides  150 mg Oral BID  . isosorbide mononitrate  60 mg Oral BID  . latanoprost  1 drop Right Eye QHS  . lisinopril  40 mg Oral QHS  . metoprolol succinate  75 mg Oral Daily  . pantoprazole  40 mg Oral BID  . pilocarpine  1 drop Right Eye QID  . potassium chloride  10 mEq Oral Daily  . pravastatin  40 mg Oral q1800  . sodium chloride flush  3 mL Intravenous Q12H  . vancomycin  1,000 mg Intravenous Q24H    Family History    Family History  Problem Relation Age of Onset  . Glaucoma Mother   . Diabetes Mother   . Peptic Ulcer Father   . Colitis Father     Social History    Social History   Social History  . Marital status: Widowed    Spouse name: N/A  . Number of children: N/A  . Years of education: N/A   Occupational History  . Not on file.   Social History Main Topics  . Smoking status: Never Smoker  . Smokeless tobacco: Never Used  . Alcohol use No  . Drug use: No  . Sexual activity: Not on file   Other Topics Concern  . Not on file   Social History Narrative  . No narrative on file     Review of Systems    General:  No chills, +++ fever, +++ generalized malaise/wkns, no night sweats or weight changes.  Cardiovascular:  No chest pain, dyspnea on exertion, edema, orthopnea, palpitations, paroxysmal nocturnal dyspnea. Dermatological: No rash, lesions/masses Respiratory: No cough, dyspnea Urologic: No hematuria, dysuria Abdominal:   No nausea, vomiting, diarrhea, bright red blood per rectum, melena, or hematemesis Neurologic:  No visual changes, wkns, changes in mental status. MSK:   Diffuse body tenderness and acute on chronic mid-back pain. All other systems reviewed and are otherwise negative except as noted above.  Physical Exam    Blood pressure (!) 139/56, pulse 86, temperature 100 F (37.8 C), temperature source Oral,  resp. rate 18, height 5' (1.524 m), weight 156 lb 3.2 oz (70.9 kg), SpO2 100 %.  General: Pleasant, NAD Psych: Normal affect. Neuro: Alert and oriented X 3. Moves all extremities spontaneously. HEENT: Normal  Neck: Supple without bruits or JVD. Lungs:  Resp regular and unlabored, CTA. Heart: RRR 3/6 SEM heard throughout, loudest @ bilat USB. Crisp S2.  No s3, s4. Abdomen: Soft, non-tender, non-distended, BS + x 4.  Extremities: No clubbing, cyanosis or edema. Extremities are diffusely tender.  DP/PT/Radials 1+ and equal bilaterally.  Labs     Lab Results  Component Value Date   WBC 9.6 03/27/2016   HGB 10.0 (L) 03/27/2016   HCT 28.5 (L) 03/27/2016   MCV 89.5 03/27/2016   PLT 180 03/27/2016    Recent Labs Lab 03/27/16 2138  NA 138  K 3.5  CL 102  CO2 29  BUN 17  CREATININE 0.95  CALCIUM 8.6*  PROT 6.4*  BILITOT 0.8  ALKPHOS 40  ALT 20  AST 56*  GLUCOSE 90   Lab Results  Component Value Date   CHOL 114 09/11/2014   HDL 40 (L) 09/11/2014   LDLCALC 55 09/11/2014   TRIG 97 09/11/2014     Lab Results  Component Value Date   TROPONINI 1.09 ([place]) 03/28/2016    Radiology Studies    Ct Abdomen Pelvis W Contrast  Result Date: 03/27/2016 CLINICAL DATA:  Back pain 3 days.  Fever. EXAM: CT ABDOMEN AND PELVIS WITH CONTRAST TECHNIQUE: Multidetector CT imaging of the abdomen and pelvis was performed using the standard protocol following bolus administration of intravenous contrast. CONTRAST:  171mL ISOVUE-300 IOPAMIDOL (ISOVUE-300) INJECTION 61% COMPARISON:  08/28/2014 FINDINGS: Lower chest: Examination demonstrates mild left basilar atelectatic change. There are a few small bilateral nodular densities unchanged. Mild cardiomegaly.  Calcified plaque over the coronary arteries and thoracic aorta. Hepatobiliary: 2 small subcentimeter hypodensities over the right lobe of the liver too small to characterize but likely cysts. Gallbladder and biliary tree are within normal. Pancreas: Within normal. Spleen: Within normal. Adrenals/Urinary Tract: Adrenal glands are within normal. Kidneys are normal in size, shape and position without hydronephrosis or nephrolithiasis. Visualize ureters are within normal. There is laxity of the pelvic floor unchanged with suggestion of a cystocele as the bladder is otherwise unremarkable. Stomach/Bowel: Small hiatal hernia. Small bowel is within normal. Appendix is normal. There is moderate to severe diverticulosis throughout the colon. Vascular/Lymphatic: Mild-to-moderate calcified plaque over the abdominal aorta as well as at the takeoff of the renal arteries and also involving the iliac arteries. Remaining vascular structures are unremarkable. No evidence of adenopathy. Reproductive: Evidence of patient's known uterine prolapse with significant pelvic floor laxity unchanged. Other: No free fluid or focal inflammatory change. Musculoskeletal: Moderate degenerative changes spine and mild degenerative change of the hips. IMPRESSION: No acute findings in the abdomen/pelvis. Severe diverticulosis throughout the colon without active inflammation. Moderate pelvic floor laxity with evidence of known uterine prolapse as well as cystocele unchanged. Two adjacent subcentimeter liver hypodensities too small to characterize but likely cysts. Aortic atherosclerosis. Atherosclerotic coronary artery disease. Small hiatal hernia. Electronically Signed   By: Marin Olp M.D.   On: 03/27/2016 23:14   Dg Chest Port 1 View  Result Date: 03/27/2016 CLINICAL DATA:  Fever with back pain.  Concern for pneumonia. EXAM: PORTABLE CHEST 1 VIEW COMPARISON:  Frontal and lateral views 01/23/2016 FINDINGS: Stable heart size and mediastinal  contours, mild cardiomegaly appears stable. There is thoracic aortic atherosclerosis. No confluent airspace disease.  No evidence of pulmonary edema, pleural fluid or pneumothorax. Eventration of right hemidiaphragm is unchanged. There is degenerative change throughout the thoracic spine. IMPRESSION: 1. No evidence of pneumonia on portable AP view. No acute abnormality. 2. Stable mild cardiomegaly.  Thoracic aortic atherosclerosis. Electronically Signed   By: Jeb Levering M.D.   On: 03/27/2016 21:54    ECG & Cardiac Imaging    RSR, 92, 1st deg avb, LAD/LAFB, LAE, LVH, no acute st/t changes.  Assessment & Plan    1.  Elevated Troponin:  Pt presented 1/28 with diffuse body tenderness and severe mid-back pain. No chest pain or dyspnea. This in the setting of somewhat chronic fatigue, which her family has noted is worse over the past 3 mos.  She was febrile on admission with initial concern for sepsis.  Troponin was found to be elevated @ 1.64.  Subsequent trop 1.09 this am.  She denies chest pain or dyspnea.  She does have severe AS.  ECG is non-acute.  This does not appear to represent ACS.  Likely demand ischemia in the setting of febrile illness.  She will need an ischemic eval @ some point, esp considering pending TAVR eval (scheduled to see M. Burt Knack, MD on Friday).  Would hold off until she recovers from febrile illness.  Cont asa/ blocker.  2.  Severe Aortic Stenosis:  She denies any recent symptoms of chest pain, dyspnea, or syncope.  Activity is pretty limited however.  She has f/u in TAVR clinic on Friday.  3.  Hypertensive Heart Dzs w/ Chronic Diastolic CHF:  Euvolemic on exam.  BP reasonable on current meds.  Recently started on low dose lasix - tolerating well. Renal fxn stable.    4.  HL:  Cont statin.  5. DM II:  Per IM.  6.  Anemia of chronic dzs:  H/H stable dating back to 1/26.  Unlikely to be contributing to diffuse body pain.  7.  ? CAP:  None seen on CXR.  IM treating for  presumed CAP  abx per IM.  Signed, Murray Hodgkins, NP 03/28/2016, 8:40 AM

## 2016-03-28 NOTE — Progress Notes (Signed)
Pharmacy Antibiotic Note  [name] is a 83 y.o. female admitted on 03/27/2016 with sepsis.  Pharmacy has been consulted for vancomycin and cefepime dosing.  Plan: DW 57kg  Vd 46L kei 0.034 hr-1  T1/2 20 hours Vancomycin 1 gram q 24 hours ordered with stacked dosing. Level before 5th dose. Goal trough 15-20.  Cefepime 2 grams q 12 hours ordered.  Height: 5' (152.4 cm) Weight: 163 lb (73.9 kg) IBW/kg (Calculated) : 45.5  Temp (24hrs), Avg:100.1 F (37.8 C), Min:99.5 F (37.5 C), Max:100.6 F (38.1 C)   Recent Labs Lab 03/25/16 1546 03/27/16 2137 03/27/16 2138  WBC 7.6  --  9.6  CREATININE 0.97  --  0.95  LATICACIDVEN  --  0.9  --     Estimated Creatinine Clearance: 36.1 mL/min (by C-G formula based on SCr of 0.95 mg/dL).    Allergies  Allergen Reactions  . Metformin And Related     Antimicrobials this admission: vancomycin 1/29 >>  Cefepime 1/29  >>   Dose adjustments this admission:   Microbiology results: 1/28 BCx: pending 1/28 UCx: pending  1/29 MRSA: pending     1/28 CXR: no PNA 1/28 UA: (-)  Thank you for allowing pharmacy to be a part of this patient's care.  [name] S 03/28/2016 3:26 AM

## 2016-03-28 NOTE — Progress Notes (Signed)
Notified MD that patient is having intense pain still after placing lidocaine patch and giving tramadol. New orders for pain medication placed and MD ordered another xray of patient's thoracic spine. Patient refused to go down for test at this time. Pt states she will go later for the xray once pain medication gives some relief.

## 2016-03-28 NOTE — ED Notes (Signed)
Pt placed on oxygen at 2lpm via [place] for pox while sleeping of 88%. Pt with rebound pox up to 94% with oxygen in place. Family in room sleeping. Pt sleeping.

## 2016-03-28 NOTE — ED Notes (Signed)
Family updated on treatment progression. Family verbalizes understanding.

## 2016-03-28 NOTE — ED Notes (Signed)
Repeat troponin obtained, pt slept though blood sample. Family sleeping in room. Skin normal color warm and dry. Pt woken to assess temperature. Pt states pain improved. Pt updated on delay for bed assignment.

## 2016-03-29 ENCOUNTER — Inpatient Hospital Stay: Payer: Medicare HMO

## 2016-03-29 DIAGNOSIS — M549 Dorsalgia, unspecified: Secondary | ICD-10-CM

## 2016-03-29 DIAGNOSIS — R778 Other specified abnormalities of plasma proteins: Secondary | ICD-10-CM

## 2016-03-29 DIAGNOSIS — A419 Sepsis, unspecified organism: Secondary | ICD-10-CM

## 2016-03-29 DIAGNOSIS — R7989 Other specified abnormal findings of blood chemistry: Secondary | ICD-10-CM

## 2016-03-29 DIAGNOSIS — J069 Acute upper respiratory infection, unspecified: Secondary | ICD-10-CM

## 2016-03-29 DIAGNOSIS — B9789 Other viral agents as the cause of diseases classified elsewhere: Secondary | ICD-10-CM

## 2016-03-29 DIAGNOSIS — M5440 Lumbago with sciatica, unspecified side: Secondary | ICD-10-CM

## 2016-03-29 LAB — GLUCOSE, CAPILLARY
Glucose-Capillary: 109 mg/dL — ABNORMAL HIGH (ref 65–99)
Glucose-Capillary: 125 mg/dL — ABNORMAL HIGH (ref 65–99)
Glucose-Capillary: 167 mg/dL — ABNORMAL HIGH (ref 65–99)
Glucose-Capillary: 164 mg/dL — ABNORMAL HIGH (ref 65–99)

## 2016-03-29 LAB — BASIC METABOLIC PANEL
Anion gap: 5 (ref 5–15)
BUN: 13 mg/dL (ref 6–20)
CO2: 29 mmol/L (ref 22–32)
CREATININE: 0.83 mg/dL (ref 0.44–1.00)
Calcium: 8.4 mg/dL — ABNORMAL LOW (ref 8.9–10.3)
Chloride: 105 mmol/L (ref 101–111)
GFR calc non Af Amer: >60 mL/min (ref >60)
Glucose, Bld: 134 mg/dL — ABNORMAL HIGH (ref 65–99)
Potassium: 3.6 mmol/L (ref 3.5–5.1)
Sodium: 139 mmol/L (ref 135–145)

## 2016-03-29 LAB — URINE CULTURE

## 2016-03-29 LAB — HEMOGLOBIN A1C
Hgb A1c MFr Bld: 6.8 % — ABNORMAL HIGH (ref 4.8–5.6)
Mean Plasma Glucose: 148 mg/dL

## 2016-03-29 MED ORDER — GADOBENATE DIMEGLUMINE 529 MG/ML IV SOLN
15.0000 mL | Freq: Once | INTRAVENOUS | Status: AC | PRN
  Administered 2016-03-29: 14 mL via INTRAVENOUS

## 2016-03-29 MED ORDER — METHYLPREDNISOLONE 4 MG PO TBPK
4.0000 mg | ORAL_TABLET | ORAL | Status: AC
  Administered 2016-03-29: 4 mg via ORAL

## 2016-03-29 MED ORDER — METHYLPREDNISOLONE 4 MG PO TBPK
4.0000 mg | ORAL_TABLET | Freq: Four times a day (QID) | ORAL | Status: DC
  Administered 2016-03-31 – 2016-04-01 (×5): 4 mg via ORAL

## 2016-03-29 MED ORDER — METHYLPREDNISOLONE 4 MG PO TBPK
8.0000 mg | ORAL_TABLET | Freq: Every evening | ORAL | Status: AC
  Administered 2016-03-29: 8 mg via ORAL

## 2016-03-29 MED ORDER — METHYLPREDNISOLONE 4 MG PO TBPK
4.0000 mg | ORAL_TABLET | Freq: Three times a day (TID) | ORAL | Status: AC
  Administered 2016-03-30 (×3): 4 mg via ORAL

## 2016-03-29 MED ORDER — ENSURE ENLIVE PO LIQD
237.0000 mL | Freq: Two times a day (BID) | ORAL | Status: DC
  Administered 2016-03-29 – 2016-04-01 (×5): 237 mL via ORAL

## 2016-03-29 MED ORDER — SODIUM CHLORIDE 0.9 % IV SOLN
2.0000 g | Freq: Four times a day (QID) | INTRAVENOUS | Status: DC
  Administered 2016-03-29 – 2016-04-01 (×13): 2 g via INTRAVENOUS
  Filled 2016-03-29 (×16): qty 2000

## 2016-03-29 MED ORDER — METHYLPREDNISOLONE 4 MG PO TBPK
8.0000 mg | ORAL_TABLET | Freq: Every evening | ORAL | Status: AC
  Administered 2016-03-30: 8 mg via ORAL

## 2016-03-29 MED ORDER — METHYLPREDNISOLONE 4 MG PO TBPK
8.0000 mg | ORAL_TABLET | Freq: Every morning | ORAL | Status: AC
  Administered 2016-03-30: 8 mg via ORAL
  Filled 2016-03-29: qty 21

## 2016-03-29 NOTE — Consult Note (Addendum)
[facility]     Reason for Consult: Enterococcal bacteremia   Referring Physician: Claria Dice Date of Admission:  03/27/2016   Active Problems:   CAP (community acquired pneumonia)   Viral upper respiratory tract infection   Acute back pain   Sepsis ([place])   Elevated troponin   HPI: KEYMIAH LYLES is a 83 y.o. female admitted with back pain worsening over 3-4 days. She also had 3 weeks cough and on admit temp 100.6, wbc 9.  She had neg CXR, neg CT abd. UA 0-5 wbc and neg flu PCR> BCX turned + enterococcus 2/2.  She has hx severe AS and is being considered for TAVR. Had recent TTE 03/24/15. Started cefepime and vanco and no fevers.   Past Medical History:  Diagnosis Date  . Anemia   . Bone disease   . Carotid arterial disease ([place])    a. 08/2014 U/S: Bilateral < 50% stneosis. Patent vertebrals w/ antegrade flow.   . Cervical spine arthritis ([place])   . Chronic diastolic CHF (congestive heart failure) ([place])    a. echo 2014: EF 55-60%, DD;  b. 08/2014 Echo: EF 55-60%, no RWMA, GR1DD; c. 02/2016 Echo: EF 65-70%, Gr1 DD.  Marland Kitchen Cognitive impairment   . Depression   . Diabetes mellitus without complication ([place])   . Essential hypertension   . GERD (gastroesophageal reflux disease)   . Glaucoma   . Gout   . Hiatal hernia   . History of renal impairment   . Hyperlipidemia   . Hypotension    a. Related to Norvasc - 11/2014 ED visit.  . Multinodular goiter    a. Noted incidentally on CT 07/2012 and carotid U/S 08/2014;  b. Nl TSH 11/2014.  Marland Kitchen Paroxysmal SVT (supraventricular tachycardia) (HCC)    a. on Toprol   . Prolapsed uterus   . Severe aortic stenosis    a. echo 08/2014: EF 55-60%, no RWMA, GR1DD, severe AS, mild AI, Mean gradient (S): 38 mm Hg. Valve area (VTI): 0.78 cm^2., mild-mod MR, LA mildly dilated, atrial septal aneurysm, mild TR;  b. 02/2016 Echo: EF 65-70%, Gr1 DD, sev AS, mild to mod AI, Valve area (VTI): 0.94 cm^2, (Vmax) 0.96 cm^2, (Vmean) 0.93cm^2, mild  MR, mildly dil LA, nl RV fxn, PASP 50mHg.  . Stroke ([facility]    a. CT head 07/2014 showed small thalamic infarct  . Vitamin deficiency    Past Surgical History:  Procedure Laterality Date  . TUBAL LIGATION     Social History  Substance Use Topics  . Smoking status: Never Smoker  . Smokeless tobacco: Never Used  . Alcohol use No   Family History  Problem Relation Age of Onset  . Glaucoma Mother   . Diabetes Mother   . Peptic Ulcer Father   . Colitis Father     Allergies:  Allergies  Allergen Reactions  . Metformin And Related     Current antibiotics: Antibiotics Given (last 72 hours)    Date/Time Action Medication Dose Rate   03/28/16 1355 Given   ceFEPIme (MAXIPIME) 2 g in dextrose 5 % 50 mL IVPB 2 g 100 mL/hr   03/28/16 1646 Given   vancomycin (VANCOCIN) IVPB 1000 mg/200 mL premix 1,000 mg 200 mL/hr   03/29/16 0150 Given   ceFEPIme (MAXIPIME) 2 g in dextrose 5 % 50 mL IVPB 2 g 100 mL/hr   03/29/16 1218 Given   ceFEPIme (MAXIPIME) 2 g in dextrose 5 % 50 mL IVPB 2 g 100  mL/hr   03/29/16 1404 Given   vancomycin (VANCOCIN) IVPB 1000 mg/200 mL premix 1,000 mg 200 mL/hr      MEDICATIONS: . allopurinol  300 mg Oral Daily  . aspirin  81 mg Oral Daily  . brimonidine  1 drop Right Eye BID  . ceFEPime (MAXIPIME) IV  2 g Intravenous Q12H  . cholecalciferol  2,000 Units Oral Daily  . docusate sodium  100 mg Oral BID  . dorzolamide  1 drop Right Eye BID   And  . timolol  1 drop Right Eye BID  . doxazosin  4 mg Oral BID  . enoxaparin (LOVENOX) injection  40 mg Subcutaneous Q24H  . feeding supplement (ENSURE ENLIVE)  237 mL Oral BID BM  . furosemide  20 mg Oral Daily  . gabapentin  300 mg Oral BID  . hydrALAZINE  25 mg Oral TID  . insulin aspart  0-15 Units Subcutaneous TID WC  . insulin aspart  0-5 Units Subcutaneous QHS  . iron polysaccharides  150 mg Oral BID  . isosorbide mononitrate  60 mg Oral BID  . latanoprost  1 drop Right Eye QHS  . lidocaine  1 patch  Transdermal Q24H  . lisinopril  40 mg Oral QHS  . metaxalone  400 mg Oral TID  . methylPREDNISolone  4 mg Oral PC supper  . [START ON 03/30/2016] methylPREDNISolone  4 mg Oral 3 x daily with food  . [START ON 03/31/2016] methylPREDNISolone  4 mg Oral 4X daily taper  . [START ON 03/30/2016] methylPREDNISolone  8 mg Oral AC breakfast  . methylPREDNISolone  8 mg Oral Nightly  . [START ON 03/30/2016] methylPREDNISolone  8 mg Oral Nightly  . metoprolol succinate  75 mg Oral Daily  . pantoprazole  40 mg Oral BID  . pilocarpine  1 drop Right Eye QID  . potassium chloride  10 mEq Oral Daily  . pravastatin  40 mg Oral q1800  . sodium chloride flush  3 mL Intravenous Q12H  . vancomycin  1,000 mg Intravenous Q24H    Review of Systems - 11 systems reviewed and negative per HPI   OBJECTIVE: Temp:  [97.9 F (36.6 C)-98.8 F (37.1 C)] 98.7 F (37.1 C) (01/30 1206) Pulse Rate:  [84-95] 87 (01/30 1206) Resp:  [16-18] 18 (01/30 1206) BP: (99-148)/(45-60) 99/45 (01/30 1206) SpO2:  [90 %-99 %] 90 % (01/30 1206) Weight:  [70.7 kg (155 lb 14.4 oz)] 70.7 kg (155 lb 14.4 oz) (01/30 0439) onstitutional:  [place],  HENT: [place]/AT, PERRLA, no scleral icterus Mouth/Throat: Oropharynx is clear and moist. No oropharyngeal exudate.  Cardiovascular: Normal rate, regular rhythm and normal heart sounds. 3/6 sm Pulmonary/Chest: Effort normal and breath sounds normal. No respiratory distress.  has no wheezes.  Neck = supple, no nuchal rigidity Abdominal: Soft. Bowel sounds are normal.  exhibits no distension. There is no tenderness.  Lymphadenopathy: no cervical adenopathy. No axillary adenopathy Neurological: alert and oriented to person, place, and time.  Skin: Skin is warm and dry. No rash noted. No erythema.  Psychiatric: a normal mood and affect.  behavior is normal.    LABS: Results for orders placed or performed during the hospital encounter of 03/27/16 (from the past 48 hour(s))  Lactic acid, plasma      Status: None   Collection Time: 03/27/16  9:37 PM  Result Value Ref Range   Lactic Acid, Venous 0.9 0.5 - 1.9 mmol/L  Comprehensive metabolic panel     Status: Abnormal   Collection  Time: 03/27/16  9:38 PM  Result Value Ref Range   Sodium 138 135 - 145 mmol/L   Potassium 3.5 3.5 - 5.1 mmol/L   Chloride 102 101 - 111 mmol/L   CO2 29 22 - 32 mmol/L   Glucose, Bld 90 65 - 99 mg/dL   BUN 17 6 - 20 mg/dL   Creatinine, Ser 0.95 0.44 - 1.00 mg/dL   Calcium 8.6 (L) 8.9 - 10.3 mg/dL   Total Protein 6.4 (L) 6.5 - 8.1 g/dL   Albumin 2.9 (L) 3.5 - 5.0 g/dL   AST 56 (H) 15 - 41 U/L   ALT 20 14 - 54 U/L   Alkaline Phosphatase 40 38 - 126 U/L   Total Bilirubin 0.8 0.3 - 1.2 mg/dL   GFR calc non Af Amer 52 (L) >60 mL/min   GFR calc Af Amer 60 (L) >60 mL/min    Comment: (NOTE) The eGFR has been calculated using the CKD EPI equation. This calculation has not been validated in all clinical situations. eGFR's persistently <60 mL/min signify possible Chronic Kidney Disease.    Anion gap 7 5 - 15  Troponin I     Status: Abnormal   Collection Time: 03/27/16  9:38 PM  Result Value Ref Range   Troponin I 1.64 (HH) <0.03 ng/mL    Comment: CRITICAL RESULT CALLED TO, READ BACK BY AND VERIFIED WITH SUSAN NEAL AT 2222 03/27/16.PMH  CBC WITH DIFFERENTIAL     Status: Abnormal   Collection Time: 03/27/16  9:38 PM  Result Value Ref Range   WBC 9.6 3.6 - 11.0 K/uL   RBC 3.19 (L) 3.80 - 5.20 MIL/uL   Hemoglobin 10.0 (L) 12.0 - 16.0 g/dL   HCT 28.5 (L) 35.0 - 47.0 %   MCV 89.5 80.0 - 100.0 fL   MCH 31.4 26.0 - 34.0 pg   MCHC 35.1 32.0 - 36.0 g/dL   RDW 14.6 (H) 11.5 - 14.5 %   Platelets 180 150 - 440 K/uL   Neutrophils Relative % 86 %   Neutro Abs 8.3 (H) 1.4 - 6.5 K/uL   Lymphocytes Relative 6 %   Lymphs Abs 0.6 (L) 1.0 - 3.6 K/uL   Monocytes Relative 7 %   Monocytes Absolute 0.6 0.2 - 0.9 K/uL   Eosinophils Relative 1 %   Eosinophils Absolute 0.1 0 - 0.7 K/uL   Basophils Relative 0 %   Basophils  Absolute 0.0 0 - 0.1 K/uL  Urine culture     Status: Abnormal   Collection Time: 03/27/16  9:38 PM  Result Value Ref Range   Specimen Description URINE, RANDOM    Special Requests NONE    Culture MULTIPLE SPECIES PRESENT, SUGGEST RECOLLECTION (A)    Report Status 03/29/2016 FINAL   Urinalysis, Complete w Microscopic     Status: Abnormal   Collection Time: 03/27/16  9:38 PM  Result Value Ref Range   Color, Urine YELLOW (A) YELLOW   APPearance CLEAR (A) CLEAR   Specific Gravity, Urine 1.030 1.005 - 1.030   pH 5.0 5.0 - 8.0   Glucose, UA NEGATIVE NEGATIVE mg/dL   Hgb urine dipstick NEGATIVE NEGATIVE   Bilirubin Urine NEGATIVE NEGATIVE   Ketones, ur 5 (A) NEGATIVE mg/dL   Protein, ur NEGATIVE NEGATIVE mg/dL   Nitrite NEGATIVE NEGATIVE   Leukocytes, UA NEGATIVE NEGATIVE   RBC / HPF 0-5 0 - 5 RBC/hpf   WBC, UA 0-5 0 - 5 WBC/hpf   Bacteria, UA RARE (A) NONE SEEN  Squamous Epithelial / LPF 0-5 (A) NONE SEEN   Mucous PRESENT   Influenza panel by PCR (type A & B)     Status: None   Collection Time: 03/27/16  9:38 PM  Result Value Ref Range   Influenza A By PCR NEGATIVE NEGATIVE   Influenza B By PCR NEGATIVE NEGATIVE    Comment: (NOTE) The Xpert Xpress Flu assay is intended as an aid in the diagnosis of  influenza and should not be used as a sole basis for treatment.  This  assay is FDA approved for nasopharyngeal swab specimens only. Nasal  washings and aspirates are unacceptable for Xpert Xpress Flu testing.   Blood Culture (routine x 2)     Status: None (Preliminary result)   Collection Time: 03/27/16  9:39 PM  Result Value Ref Range   Specimen Description BLOOD RIGHT ANTECUBITAL    Special Requests      BOTTLES DRAWN AEROBIC AND ANAEROBIC AER 10CC, ANA 8CC   Culture  Setup Time      GRAM POSITIVE COCCI IN BOTH AEROBIC AND ANAEROBIC BOTTLES CRITICAL VALUE NOTED.  VALUE IS CONSISTENT WITH PREVIOUSLY REPORTED AND CALLED VALUE.    Culture GRAM POSITIVE COCCI    Report Status  PENDING   Blood Culture (routine x 2)     Status: None   Collection Time: 03/27/16  9:45 PM  Result Value Ref Range   Specimen Description BLOOD    Special Requests      TEST REQUEST RECEIVED WITHOUT APPROPRIATE SPECIMEN  RECEIVED IN ERROR   Culture TEST CANCELED RECEIVED IN ERROR    Report Status 03/28/2016 FINAL   Blood Culture (routine x 2)     Status: None (Preliminary result)   Collection Time: 03/27/16 11:10 PM  Result Value Ref Range   Specimen Description BLOOD RIGHT ANTECUBITAL    Special Requests      BOTTLES DRAWN AEROBIC AND ANAEROBIC AER 11CC,ANA [place]   Culture  Setup Time      Organism ID to follow GRAM POSITIVE COCCI ANAEROBIC BOTTLE ONLY CRITICAL RESULT CALLED TO, READ BACK BY AND VERIFIED WITH: JASON ROBBINS 03/28/16 @ 1933  [place]    Culture GRAM POSITIVE COCCI    Report Status PENDING   Blood Culture ID Panel (Reflexed)     Status: Abnormal   Collection Time: 03/27/16 11:10 PM  Result Value Ref Range   Enterococcus species DETECTED (A) NOT DETECTED    Comment: CRITICAL RESULT CALLED TO, READ BACK BY AND VERIFIED WITH: JASON ROBBINS 03/28/16 @ 1933 MLK    Vancomycin resistance NOT DETECTED NOT DETECTED   Listeria monocytogenes NOT DETECTED NOT DETECTED   Staphylococcus species NOT DETECTED NOT DETECTED   Staphylococcus aureus NOT DETECTED NOT DETECTED   Streptococcus species NOT DETECTED NOT DETECTED   Streptococcus agalactiae NOT DETECTED NOT DETECTED   Streptococcus pneumoniae NOT DETECTED NOT DETECTED   Streptococcus pyogenes NOT DETECTED NOT DETECTED   Acinetobacter baumannii NOT DETECTED NOT DETECTED   Enterobacteriaceae species NOT DETECTED NOT DETECTED   Enterobacter cloacae complex NOT DETECTED NOT DETECTED   Escherichia coli NOT DETECTED NOT DETECTED   Klebsiella oxytoca NOT DETECTED NOT DETECTED   Klebsiella pneumoniae NOT DETECTED NOT DETECTED   Proteus species NOT DETECTED NOT DETECTED   Serratia marcescens NOT DETECTED NOT DETECTED   Haemophilus  influenzae NOT DETECTED NOT DETECTED   Neisseria meningitidis NOT DETECTED NOT DETECTED   Pseudomonas aeruginosa NOT DETECTED NOT DETECTED   Candida albicans NOT DETECTED NOT DETECTED  Candida glabrata NOT DETECTED NOT DETECTED   Candida krusei NOT DETECTED NOT DETECTED   Candida parapsilosis NOT DETECTED NOT DETECTED   Candida tropicalis NOT DETECTED NOT DETECTED  TSH     Status: None   Collection Time: 03/28/16  4:44 AM  Result Value Ref Range   TSH 0.681 0.350 - 4.500 uIU/mL    Comment: Performed by a 3rd Generation assay with a functional sensitivity of <=0.01 uIU/mL.  Troponin I     Status: Abnormal   Collection Time: 03/28/16  4:44 AM  Result Value Ref Range   Troponin I 1.09 (HH) <0.03 ng/mL    Comment: CRITICAL VALUE NOTED. VALUE IS CONSISTENT WITH PREVIOUSLY REPORTED/CALLED VALUE.PMH  MRSA PCR Screening     Status: None   Collection Time: 03/28/16  7:10 AM  Result Value Ref Range   MRSA by PCR NEGATIVE NEGATIVE    Comment:        The GeneXpert MRSA Assay (FDA approved for NASAL specimens only), is one component of a comprehensive MRSA colonization surveillance program. It is not intended to diagnose MRSA infection nor to guide or monitor treatment for MRSA infections.   Glucose, capillary     Status: Abnormal   Collection Time: 03/28/16  8:29 AM  Result Value Ref Range   Glucose-Capillary 130 (H) 65 - 99 mg/dL  Glucose, capillary     Status: Abnormal   Collection Time: 03/28/16 11:47 AM  Result Value Ref Range   Glucose-Capillary 107 (H) 65 - 99 mg/dL  Troponin I     Status: Abnormal   Collection Time: 03/28/16 11:48 AM  Result Value Ref Range   Troponin I 0.76 (HH) <0.03 ng/mL    Comment: CRITICAL VALUE NOTED. VALUE IS CONSISTENT WITH PREVIOUSLY REPORTED/CALLED VALUE MNS  Hemoglobin A1c     Status: Abnormal   Collection Time: 03/28/16 11:48 AM  Result Value Ref Range   Hgb A1c MFr Bld 6.8 (H) 4.8 - 5.6 %    Comment: (NOTE)         Pre-diabetes: 5.7 -  6.4         Diabetes: >6.4         Glycemic control for adults with diabetes: <7.0    Mean Plasma Glucose 148 mg/dL    Comment: (NOTE) Performed At: [facility] [place], [place] [id number] Lindon Romp MD WU:[id number]   Glucose, capillary     Status: Abnormal   Collection Time: 03/28/16  4:34 PM  Result Value Ref Range   Glucose-Capillary 215 (H) 65 - 99 mg/dL  Troponin I     Status: Abnormal   Collection Time: 03/28/16  4:54 PM  Result Value Ref Range   Troponin I 0.69 (HH) <0.03 ng/mL    Comment: CRITICAL VALUE NOTED. VALUE IS CONSISTENT WITH PREVIOUSLY REPORTED/CALLED VALUE [place]  Glucose, capillary     Status: Abnormal   Collection Time: 03/28/16  9:01 PM  Result Value Ref Range   Glucose-Capillary 165 (H) 65 - 99 mg/dL   Comment 1 Notify RN   Basic metabolic panel     Status: Abnormal   Collection Time: 03/29/16  4:13 AM  Result Value Ref Range   Sodium 139 135 - 145 mmol/L   Potassium 3.6 3.5 - 5.1 mmol/L   Chloride 105 101 - 111 mmol/L   CO2 29 22 - 32 mmol/L   Glucose, Bld 134 (H) 65 - 99 mg/dL   BUN 13 6 - 20 mg/dL   Creatinine,  Ser 0.83 0.44 - 1.00 mg/dL   Calcium 8.4 (L) 8.9 - 10.3 mg/dL   GFR calc non Af Amer >60 >60 mL/min   GFR calc Af Amer >60 >60 mL/min    Comment: (NOTE) The eGFR has been calculated using the CKD EPI equation. This calculation has not been validated in all clinical situations. eGFR's persistently <60 mL/min signify possible Chronic Kidney Disease.    Anion gap 5 5 - 15  Glucose, capillary     Status: Abnormal   Collection Time: 03/29/16  7:36 AM  Result Value Ref Range   Glucose-Capillary 109 (H) 65 - 99 mg/dL  Glucose, capillary     Status: Abnormal   Collection Time: 03/29/16 11:41 AM  Result Value Ref Range   Glucose-Capillary 125 (H) 65 - 99 mg/dL   No components found for: ESR, C REACTIVE PROTEIN MICRO: Recent Results (from the past 720 hour(s))  Urine culture     Status: Abnormal   Collection  Time: 03/27/16  9:38 PM  Result Value Ref Range Status   Specimen Description URINE, RANDOM  Final   Special Requests NONE  Final   Culture MULTIPLE SPECIES PRESENT, SUGGEST RECOLLECTION (A)  Final   Report Status 03/29/2016 FINAL  Final  Blood Culture (routine x 2)     Status: None (Preliminary result)   Collection Time: 03/27/16  9:39 PM  Result Value Ref Range Status   Specimen Description BLOOD RIGHT ANTECUBITAL  Final   Special Requests   Final    BOTTLES DRAWN AEROBIC AND ANAEROBIC AER 10CC, ANA 8CC   Culture  Setup Time   Final    GRAM POSITIVE COCCI IN BOTH AEROBIC AND ANAEROBIC BOTTLES CRITICAL VALUE NOTED.  VALUE IS CONSISTENT WITH PREVIOUSLY REPORTED AND CALLED VALUE.    Culture GRAM POSITIVE COCCI  Final   Report Status PENDING  Incomplete  Blood Culture (routine x 2)     Status: None   Collection Time: 03/27/16  9:45 PM  Result Value Ref Range Status   Specimen Description BLOOD  Final   Special Requests   Final    TEST REQUEST RECEIVED WITHOUT APPROPRIATE SPECIMEN  RECEIVED IN ERROR   Culture TEST CANCELED RECEIVED IN ERROR  Final   Report Status 03/28/2016 FINAL  Final  Blood Culture (routine x 2)     Status: None (Preliminary result)   Collection Time: 03/27/16 11:10 PM  Result Value Ref Range Status   Specimen Description BLOOD RIGHT ANTECUBITAL  Final   Special Requests   Final    BOTTLES DRAWN AEROBIC AND ANAEROBIC AER 11CC,ANA [place]   Culture  Setup Time   Final    Organism ID to follow GRAM POSITIVE COCCI ANAEROBIC BOTTLE ONLY CRITICAL RESULT CALLED TO, READ BACK BY AND VERIFIED WITH: JASON ROBBINS 03/28/16 @ 1933  [place]    Culture GRAM POSITIVE COCCI  Final   Report Status PENDING  Incomplete  Blood Culture ID Panel (Reflexed)     Status: Abnormal   Collection Time: 03/27/16 11:10 PM  Result Value Ref Range Status   Enterococcus species DETECTED (A) NOT DETECTED Final    Comment: CRITICAL RESULT CALLED TO, READ BACK BY AND VERIFIED WITH: JASON ROBBINS  03/28/16 @ 1933 MLK    Vancomycin resistance NOT DETECTED NOT DETECTED Final   Listeria monocytogenes NOT DETECTED NOT DETECTED Final   Staphylococcus species NOT DETECTED NOT DETECTED Final   Staphylococcus aureus NOT DETECTED NOT DETECTED Final   Streptococcus species NOT DETECTED NOT DETECTED  Final   Streptococcus agalactiae NOT DETECTED NOT DETECTED Final   Streptococcus pneumoniae NOT DETECTED NOT DETECTED Final   Streptococcus pyogenes NOT DETECTED NOT DETECTED Final   Acinetobacter baumannii NOT DETECTED NOT DETECTED Final   Enterobacteriaceae species NOT DETECTED NOT DETECTED Final   Enterobacter cloacae complex NOT DETECTED NOT DETECTED Final   Escherichia coli NOT DETECTED NOT DETECTED Final   Klebsiella oxytoca NOT DETECTED NOT DETECTED Final   Klebsiella pneumoniae NOT DETECTED NOT DETECTED Final   Proteus species NOT DETECTED NOT DETECTED Final   Serratia marcescens NOT DETECTED NOT DETECTED Final   Haemophilus influenzae NOT DETECTED NOT DETECTED Final   Neisseria meningitidis NOT DETECTED NOT DETECTED Final   Pseudomonas aeruginosa NOT DETECTED NOT DETECTED Final   Candida albicans NOT DETECTED NOT DETECTED Final   Candida glabrata NOT DETECTED NOT DETECTED Final   Candida krusei NOT DETECTED NOT DETECTED Final   Candida parapsilosis NOT DETECTED NOT DETECTED Final   Candida tropicalis NOT DETECTED NOT DETECTED Final  MRSA PCR Screening     Status: None   Collection Time: 03/28/16  7:10 AM  Result Value Ref Range Status   MRSA by PCR NEGATIVE NEGATIVE Final    Comment:        The GeneXpert MRSA Assay (FDA approved for NASAL specimens only), is one component of a comprehensive MRSA colonization surveillance program. It is not intended to diagnose MRSA infection nor to guide or monitor treatment for MRSA infections.     IMAGING: Dg Thoracic Spine 2 View  Result Date: 03/29/2016 CLINICAL DATA:  Acute mid back pain worse over the last 3 days. EXAM: THORACIC  SPINE 2 VIEWS COMPARISON:  Chest radiograph dated 03/27/2016 and 01/23/2016. FINDINGS: No fracture.  No spondylolisthesis. There is a mild curvature of the mid to lower thoracic spine, convex the right, with a more significant curvature of the upper lumbar spine, convex to the left. There is loss of disc height with endplate spurring throughout the visualized spine. The bones are diffusely demineralized. No convincing bone lesion. Soft tissues are unremarkable. Findings are similar to the lateral chest radiograph dated 01/23/2016. IMPRESSION: 1. No acute fracture or acute finding. 2. Degenerative changes, spinal curvature and diffuse bony demineralization as detailed. Electronically Signed   By: Lajean Manes M.D.   On: 03/29/2016 08:55   Dg Lumbar Spine 2-3 Views  Result Date: 03/28/2016 CLINICAL DATA:  Low back pain and progressive weakness EXAM: LUMBAR SPINE - 3 VIEW COMPARISON:  03/27/2016 FINDINGS: Five lumbar type vertebral bodies are well visualized. Vertebral body height is well maintained. A scoliosis is noted centered at the L1-2 level concave to the right. Multilevel osteophytic changes are seen. No anterolisthesis is noted. Multilevel disc space narrowing is noted. Diffuse aortic calcifications are noted without aneurysmal dilatation. The bladder is well distended with contrast from recent CT. IMPRESSION: Multilevel degenerative change similar to that seen on prior CT examination. No acute abnormality is noted. Electronically Signed   By: Inez Catalina M.D.   On: 03/28/2016 10:33   Ct Abdomen Pelvis W Contrast  Result Date: 03/27/2016 CLINICAL DATA:  Back pain 3 days.  Fever. EXAM: CT ABDOMEN AND PELVIS WITH CONTRAST TECHNIQUE: Multidetector CT imaging of the abdomen and pelvis was performed using the standard protocol following bolus administration of intravenous contrast. CONTRAST:  180m ISOVUE-300 IOPAMIDOL (ISOVUE-300) INJECTION 61% COMPARISON:  08/28/2014 FINDINGS: Lower chest: Examination  demonstrates mild left basilar atelectatic change. There are a few small bilateral nodular densities unchanged. Mild cardiomegaly.  Calcified plaque over the coronary arteries and thoracic aorta. Hepatobiliary: 2 small subcentimeter hypodensities over the right lobe of the liver too small to characterize but likely cysts. Gallbladder and biliary tree are within normal. Pancreas: Within normal. Spleen: Within normal. Adrenals/Urinary Tract: Adrenal glands are within normal. Kidneys are normal in size, shape and position without hydronephrosis or nephrolithiasis. Visualize ureters are within normal. There is laxity of the pelvic floor unchanged with suggestion of a cystocele as the bladder is otherwise unremarkable. Stomach/Bowel: Small hiatal hernia. Small bowel is within normal. Appendix is normal. There is moderate to severe diverticulosis throughout the colon. Vascular/Lymphatic: Mild-to-moderate calcified plaque over the abdominal aorta as well as at the takeoff of the renal arteries and also involving the iliac arteries. Remaining vascular structures are unremarkable. No evidence of adenopathy. Reproductive: Evidence of patient's known uterine prolapse with significant pelvic floor laxity unchanged. Other: No free fluid or focal inflammatory change. Musculoskeletal: Moderate degenerative changes spine and mild degenerative change of the hips. IMPRESSION: No acute findings in the abdomen/pelvis. Severe diverticulosis throughout the colon without active inflammation. Moderate pelvic floor laxity with evidence of known uterine prolapse as well as cystocele unchanged. Two adjacent subcentimeter liver hypodensities too small to characterize but likely cysts. Aortic atherosclerosis. Atherosclerotic coronary artery disease. Small hiatal hernia. Electronically Signed   By: Marin Olp M.D.   On: 03/27/2016 23:14   Dg Chest Port 1 View  Result Date: 03/27/2016 CLINICAL DATA:  Fever with back pain.  Concern for  pneumonia. EXAM: PORTABLE CHEST 1 VIEW COMPARISON:  Frontal and lateral views 01/23/2016 FINDINGS: Stable heart size and mediastinal contours, mild cardiomegaly appears stable. There is thoracic aortic atherosclerosis. No confluent airspace disease. No evidence of pulmonary edema, pleural fluid or pneumothorax. Eventration of right hemidiaphragm is unchanged. There is degenerative change throughout the thoracic spine. IMPRESSION: 1. No evidence of pneumonia on portable AP view. No acute abnormality. 2. Stable mild cardiomegaly.  Thoracic aortic atherosclerosis. Electronically Signed   By: Jeb Levering M.D.   On: 03/27/2016 21:54   Assessment:   LELAH RENNAKER is a 83 y.o. female with enterococcal bacteremia 2/2 and acute worsening of chronic back pain. She has severe AS and was being considered for TAVR.  MRI negative of spine. High concern for endocarditis and spoke with Dr Rockey Situ regarding this and need for TEE. Repeat bcx to document clearance P 1/30 Recommendations Cont amp and ceftriaxone- this is coverage for potential endocarditis Will need to consider TEE - discussed with Dr Gilford Raid and with family  Thank you very much for allowing me to participate in the care of this patient. Please call with questions.   Cheral Marker. Ola Spurr, MD

## 2016-03-29 NOTE — Progress Notes (Signed)
To xray via bed 

## 2016-03-29 NOTE — Progress Notes (Signed)
OT Cancellation Note  Patient Details Name: [name] MRN: [id number] DOB: Feb 09, 1925   Cancelled Treatment:    Reason Eval/Treat Not Completed: Fatigue/lethargy limiting ability to participate. Order received, chart reviewed. Pt asleep upon entry into room, family present requesting OT come back tomorrow and allow pt to rest at this time. OT will re-attempt evaluation at later date/time as appropriate.  Corky Sox, OTR/L 03/29/2016, 4:03 PM

## 2016-03-29 NOTE — Progress Notes (Signed)
Pt returned from xray

## 2016-03-29 NOTE — Progress Notes (Signed)
Morphine 2mg  IV given for pain control for xray ordered this am. Will monitor.

## 2016-03-29 NOTE — Progress Notes (Signed)
PT Cancellation Note  Patient Details Name: LUCIE FRIEDLANDER MRN: [id number] DOB: May 14, 1924   Cancelled Treatment:    Reason Eval/Treat Not Completed: Patient at procedure or test/unavailable;Other (comment).  Pt currently not in room (pt's family reports pt off floor for testing).  Per notes, MRI of thoracic and lumbar spine ordered to r/o osteomyelitis.  Will re-attempt PT eval at a later date/time once results are known from imaging and pt is appropriate to participate in PT.  Leitha Bleak 03/29/2016, 3:41 PM Leitha Bleak, [place]

## 2016-03-29 NOTE — Progress Notes (Signed)
Pt back from MRI 

## 2016-03-29 NOTE — Progress Notes (Signed)
[facility] [place] NAME: [name]    MR#:  [id number]  DATE OF BIRTH:  06-Jul-1924  SUBJECTIVE:  CHIEF COMPLAINT:   Chief Complaint  Patient presents with  . Back Pain  . Fever   - still has severe low back pain. On vanc and cefepime- blood cultures positive for may be enterococcus - fevers have resolved now.  REVIEW OF SYSTEMS:  Review of Systems  Constitutional: Positive for malaise/fatigue. Negative for chills and fever.  HENT: Negative for congestion, ear discharge, hearing loss and nosebleeds.   Eyes: Negative for blurred vision and double vision.  Respiratory: Positive for cough. Negative for shortness of breath and wheezing.   Cardiovascular: Negative for chest pain, palpitations and leg swelling.  Gastrointestinal: Negative for abdominal pain, constipation, diarrhea, nausea and vomiting.  Genitourinary: Negative for dysuria.  Musculoskeletal: Positive for back pain.  Neurological: Negative for dizziness, speech change, focal weakness, seizures and headaches.  Psychiatric/Behavioral: Negative for depression.    DRUG ALLERGIES:   Allergies  Allergen Reactions  . Metformin And Related     VITALS:  Blood pressure (!) 99/45, pulse 87, temperature 98.7 F (37.1 C), temperature source Oral, resp. rate 18, height 5' (1.524 m), weight 70.7 kg (155 lb 14.4 oz), SpO2 90 %.  PHYSICAL EXAMINATION:  Physical Exam  GENERAL:  83 y.o.-year-old patient lying in the bed and appears to be in distress secondary to back pain  EYES: Pupils equal, round, reactive to light and accommodation. No scleral icterus. Extraocular muscles intact.  HEENT: Head atraumatic, normocephalic. Oropharynx and nasopharynx clear.  NECK:  Supple, no jugular venous distention. No thyroid enlargement, no tenderness.  LUNGS: Normal breath sounds bilaterally, no wheezing, rales,rhonchi or crepitation. No use of accessory muscles of respiration. Decreased  bibasilar breath sounds CARDIOVASCULAR: S1, S2 normal. No  rubs, or gallops. 3/6 systolic murmur is present ABDOMEN: Soft, nontender, nondistended. Bowel sounds present. No organomegaly or mass.  Complains of lower back pain, no radiation of pain down the legs and no neurological deficits noted EXTREMITIES: No pedal edema, cyanosis, or clubbing.  NEUROLOGIC: Cranial nerves II through XII are intact. Muscle strength 5/5 in all extremities. Sensation intact. Gait not checked.  PSYCHIATRIC: The patient is alert and oriented x 3.  SKIN: No obvious rash, lesion, or ulcer.    LABORATORY PANEL:   CBC  Recent Labs Lab 03/27/16 2138  WBC 9.6  HGB 10.0*  HCT 28.5*  PLT 180   ------------------------------------------------------------------------------------------------------------------  Chemistries   Recent Labs Lab 03/27/16 2138 03/29/16 0413  NA 138 139  K 3.5 3.6  CL 102 105  CO2 29 29  GLUCOSE 90 134*  BUN 17 13  CREATININE 0.95 0.83  CALCIUM 8.6* 8.4*  AST 56*  --   ALT 20  --   ALKPHOS 40  --   BILITOT 0.8  --    ------------------------------------------------------------------------------------------------------------------  Cardiac Enzymes  Recent Labs Lab 03/28/16 1654  TROPONINI 0.69*   ------------------------------------------------------------------------------------------------------------------  RADIOLOGY:  Dg Thoracic Spine 2 View  Result Date: 03/29/2016 CLINICAL DATA:  Acute mid back pain worse over the last 3 days. EXAM: THORACIC SPINE 2 VIEWS COMPARISON:  Chest radiograph dated 03/27/2016 and 01/23/2016. FINDINGS: No fracture.  No spondylolisthesis. There is a mild curvature of the mid to lower thoracic spine, convex the right, with a more significant curvature of the upper lumbar spine, convex to the left. There is loss of disc height with endplate spurring throughout the visualized  spine. The bones are diffusely demineralized. No convincing bone  lesion. Soft tissues are unremarkable. Findings are similar to the lateral chest radiograph dated 01/23/2016. IMPRESSION: 1. No acute fracture or acute finding. 2. Degenerative changes, spinal curvature and diffuse bony demineralization as detailed. Electronically Signed   By: Lajean Manes M.D.   On: 03/29/2016 08:55   Dg Lumbar Spine 2-3 Views  Result Date: 03/28/2016 CLINICAL DATA:  Low back pain and progressive weakness EXAM: LUMBAR SPINE - 3 VIEW COMPARISON:  03/27/2016 FINDINGS: Five lumbar type vertebral bodies are well visualized. Vertebral body height is well maintained. A scoliosis is noted centered at the L1-2 level concave to the right. Multilevel osteophytic changes are seen. No anterolisthesis is noted. Multilevel disc space narrowing is noted. Diffuse aortic calcifications are noted without aneurysmal dilatation. The bladder is well distended with contrast from recent CT. IMPRESSION: Multilevel degenerative change similar to that seen on prior CT examination. No acute abnormality is noted. Electronically Signed   By: Inez Catalina M.D.   On: 03/28/2016 10:33   Ct Abdomen Pelvis W Contrast  Result Date: 03/27/2016 CLINICAL DATA:  Back pain 3 days.  Fever. EXAM: CT ABDOMEN AND PELVIS WITH CONTRAST TECHNIQUE: Multidetector CT imaging of the abdomen and pelvis was performed using the standard protocol following bolus administration of intravenous contrast. CONTRAST:  165mL ISOVUE-300 IOPAMIDOL (ISOVUE-300) INJECTION 61% COMPARISON:  08/28/2014 FINDINGS: Lower chest: Examination demonstrates mild left basilar atelectatic change. There are a few small bilateral nodular densities unchanged. Mild cardiomegaly. Calcified plaque over the coronary arteries and thoracic aorta. Hepatobiliary: 2 small subcentimeter hypodensities over the right lobe of the liver too small to characterize but likely cysts. Gallbladder and biliary tree are within normal. Pancreas: Within normal. Spleen: Within normal.  Adrenals/Urinary Tract: Adrenal glands are within normal. Kidneys are normal in size, shape and position without hydronephrosis or nephrolithiasis. Visualize ureters are within normal. There is laxity of the pelvic floor unchanged with suggestion of a cystocele as the bladder is otherwise unremarkable. Stomach/Bowel: Small hiatal hernia. Small bowel is within normal. Appendix is normal. There is moderate to severe diverticulosis throughout the colon. Vascular/Lymphatic: Mild-to-moderate calcified plaque over the abdominal aorta as well as at the takeoff of the renal arteries and also involving the iliac arteries. Remaining vascular structures are unremarkable. No evidence of adenopathy. Reproductive: Evidence of patient's known uterine prolapse with significant pelvic floor laxity unchanged. Other: No free fluid or focal inflammatory change. Musculoskeletal: Moderate degenerative changes spine and mild degenerative change of the hips. IMPRESSION: No acute findings in the abdomen/pelvis. Severe diverticulosis throughout the colon without active inflammation. Moderate pelvic floor laxity with evidence of known uterine prolapse as well as cystocele unchanged. Two adjacent subcentimeter liver hypodensities too small to characterize but likely cysts. Aortic atherosclerosis. Atherosclerotic coronary artery disease. Small hiatal hernia. Electronically Signed   By: Marin Olp M.D.   On: 03/27/2016 23:14   Dg Chest Port 1 View  Result Date: 03/27/2016 CLINICAL DATA:  Fever with back pain.  Concern for pneumonia. EXAM: PORTABLE CHEST 1 VIEW COMPARISON:  Frontal and lateral views 01/23/2016 FINDINGS: Stable heart size and mediastinal contours, mild cardiomegaly appears stable. There is thoracic aortic atherosclerosis. No confluent airspace disease. No evidence of pulmonary edema, pleural fluid or pneumothorax. Eventration of right hemidiaphragm is unchanged. There is degenerative change throughout the thoracic spine.  IMPRESSION: 1. No evidence of pneumonia on portable AP view. No acute abnormality. 2. Stable mild cardiomegaly.  Thoracic aortic atherosclerosis. Electronically Signed   By:  Jeb Levering M.D.   On: 03/27/2016 21:54    EKG:   Orders placed or performed during the hospital encounter of 03/27/16  . EKG 12-Lead  . EKG 12-Lead  . ED EKG 12-Lead  . ED EKG 12-Lead    ASSESSMENT AND PLAN:   83 year old female with multiple medical problems including severe stenosis, arthritis, depression, diabetes, hypertension and history of stroke presents to hospital secondary to worsening back pain.  #1 Acute low back pain- no trauma, thoracic nad lumbar spine X rays negative for any fractures, has severe osteopenia and degenerative changes. -No neurological deficits noted on exam. Medrol dose pack ordered today - also MRI thoracic and lumbar spines ordered to r/o osteomyelitis esp with fevers on admission and back pain and positive blood cultures -Pain medications and muscle relaxants ordered along with Lidoderm patch.  #2 elevated troponin-likely demand ischemia, also has severe aortic stenosis. -Appreciate cardiology input. No further testing at this time. Continue cardiac medications. Patient has severe aortic stenosis  #3 Pneumonia ruled out Bacteremia- ID consult, MRI spine Recent ECHO with no vegetations, ? Repeat ECHO- await MRI results  #4 hypertension-continue Lasix, oral hydralazine, Imdur, lisinopril and metoprolol.  #5 diastolic CHF-appears stable. Continue low-dose Lasix daily  #6 DVT prophylaxis-on Lovenox.  Physical therapy consult once pain is improved    All the records are reviewed and case discussed with Care Management/Social Workerr. Management plans discussed with the patient, family and they are in agreement.  CODE STATUS: Full Code  TOTAL TIME TAKING CARE OF THIS PATIENT: 37 minutes.   POSSIBLE D/C IN 2 DAYS, DEPENDING ON CLINICAL  CONDITION.   Chane Magner M.D on 03/29/2016 at 2:57 PM  Between 7am to 6pm - Pager - [phone]  After 6pm go to [email or web address] - password [email or web address] [facility]  Office  [phone]  CC: Primary care physician; [name], MD

## 2016-03-29 NOTE — Progress Notes (Signed)
Vancomycin stopped for transport to MRI, Morphine 2mg  iv given for pain control.

## 2016-03-29 NOTE — Progress Notes (Signed)
ID brief note Enterococcal bacteremia noted on vigilanz. Currently on vanco Will see this pm Will repeat bcx and may need repeat TTE or TEE to exclude endocarditis in pt with severe valvular heart disease

## 2016-03-29 NOTE — Progress Notes (Signed)
Chaplain visited patient while rounding. Pt. Was surrounded by her three daughters. Pt. Was asleep and Chaplain interacted with patients daughter. One of Pt.'s daughters said that her Mom was hurting in her back lower especially whenever she moved.

## 2016-03-29 NOTE — Progress Notes (Addendum)
Progress Note  Patient Name: [name] Date of Encounter: 03/29/2016  Primary Cardiologist: Rockey Situ  Subjective   No events overnight Imaging this morning of lower back, given morphine for comfort  Sleeping this a.m. Daughter by the bedside, state with her all night, no complaints Some improvement in her coughing  Inpatient Medications    Scheduled Meds: . allopurinol  300 mg Oral Daily  . aspirin  81 mg Oral Daily  . brimonidine  1 drop Right Eye BID  . ceFEPime (MAXIPIME) IV  2 g Intravenous Q12H  . cholecalciferol  2,000 Units Oral Daily  . docusate sodium  100 mg Oral BID  . dorzolamide  1 drop Right Eye BID   And  . timolol  1 drop Right Eye BID  . doxazosin  4 mg Oral BID  . enoxaparin (LOVENOX) injection  40 mg Subcutaneous Q24H  . furosemide  20 mg Oral Daily  . gabapentin  300 mg Oral BID  . hydrALAZINE  25 mg Oral TID  . insulin aspart  0-15 Units Subcutaneous TID WC  . insulin aspart  0-5 Units Subcutaneous QHS  . iron polysaccharides  150 mg Oral BID  . isosorbide mononitrate  60 mg Oral BID  . latanoprost  1 drop Right Eye QHS  . lidocaine  1 patch Transdermal Q24H  . lisinopril  40 mg Oral QHS  . metaxalone  400 mg Oral TID  . metoprolol succinate  75 mg Oral Daily  . pantoprazole  40 mg Oral BID  . pilocarpine  1 drop Right Eye QID  . potassium chloride  10 mEq Oral Daily  . pravastatin  40 mg Oral q1800  . sodium chloride flush  3 mL Intravenous Q12H  . vancomycin  1,000 mg Intravenous Q24H   Continuous Infusions:  PRN Meds: acetaminophen **OR** acetaminophen, benzonatate, morphine injection, ondansetron **OR** ondansetron (ZOFRAN) IV, traMADol, trolamine salicylate   Vital Signs    Vitals:   03/28/16 1145 03/28/16 1928 03/29/16 0439 03/29/16 0746  BP: (!) 144/60 (!) 118/56 (!) 118/51 (!) 148/60  Pulse: 83 89 84 95  Resp: 18 16 16 17   Temp: 99.3 F (37.4 C) 98.8 F (37.1 C) 97.9 F (36.6 C) 98.2 F (36.8 C)  TempSrc: Oral Oral   Oral  SpO2: 98% 99% 94% 99%  Weight:   155 lb 14.4 oz (70.7 kg)   Height:        Intake/Output Summary (Last 24 hours) at 03/29/16 0920 Last data filed at 03/29/16 0754  Gross per 24 hour  Intake              306 ml  Output                0 ml  Net              306 ml   Filed Weights   03/27/16 2110 03/28/16 0658 03/29/16 0439  Weight: 163 lb (73.9 kg) 156 lb 3.2 oz (70.9 kg) 155 lb 14.4 oz (70.7 kg)    Telemetry    Maintaining normal sinus rhythm  ECG      Physical Exam   GEN: No acute distress. Sleeping Neck: No JVD  Cardiac: RRR, 3/6 SEM RSB, no rubs, or gallops.  Radials/DP/PT 2+ and equal bilaterally.  Respiratory:  Clear to auscultation bilaterally. GI: Soft, non-distended  MS: no deformity; no edema Neuro:  Sleeping this morning, arousable  Labs    Chemistry Recent Labs Lab 03/25/16  1546 03/27/16 2138 03/29/16 0413  NA 138 138 139  K 3.8 3.5 3.6  CL 102 102 105  CO2 30 29 29   GLUCOSE 109* 90 134*  BUN 14 17 13   CREATININE 0.97 0.95 0.83  CALCIUM 8.9 8.6* 8.4*  PROT 6.8 6.4*  --   ALBUMIN 3.4* 2.9*  --   AST 32 56*  --   ALT 15 20  --   ALKPHOS 42 40  --   BILITOT 0.9 0.8  --   GFRNONAA 50* 52* >60  GFRAA 58* 60* >60  ANIONGAP 6 7 5      Hematology Recent Labs Lab 03/25/16 1546 03/27/16 2138  WBC 7.6 9.6  RBC 3.33* 3.19*  HGB 10.0* 10.0*  HCT 30.2* 28.5*  MCV 90.9 89.5  MCH 30.1 31.4  MCHC 33.2 35.1  RDW 14.9* 14.6*  PLT 169 180    Cardiac Enzymes Recent Labs Lab 03/27/16 2138 03/28/16 0444 03/28/16 1148 03/28/16 1654  TROPONINI 1.64* 1.09* 0.76* 0.69*   No results for input(s): TROPIPOC in the last 168 hours.   BNPNo results for input(s): BNP, PROBNP in the last 168 hours.   DDimer No results for input(s): DDIMER in the last 168 hours.   Radiology    Dg Thoracic Spine 2 View  Result Date: 03/29/2016  IMPRESSION: 1. No acute fracture or acute finding. 2. Degenerative changes, spinal curvature and diffuse bony  demineralization as detailed. Electronically Signed   By: Lajean Manes M.D.   On: 03/29/2016 08:55   Dg Lumbar Spine 2-3 Views  Result Date: 03/28/2016  IMPRESSION: Multilevel degenerative change similar to that seen on prior CT examination. No acute abnormality is noted. Electronically Signed   By: Inez Catalina M.D.   On: 03/28/2016 10:33   Ct Abdomen Pelvis W Contrast  Result Date: 03/27/2016  IMPRESSION: No acute findings in the abdomen/pelvis. Severe diverticulosis throughout the colon without active inflammation. Moderate pelvic floor laxity with evidence of known uterine prolapse as well as cystocele unchanged. Two adjacent subcentimeter liver hypodensities too small to characterize but likely cysts. Aortic atherosclerosis. Atherosclerotic coronary artery disease. Small hiatal hernia. Electronically Signed   By: Marin Olp M.D.   On: 03/27/2016 23:14   Dg Chest Port 1 View  Result Date: 03/27/2016  IMPRESSION: 1. No evidence of pneumonia on portable AP view. No acute abnormality. 2. Stable mild cardiomegaly.  Thoracic aortic atherosclerosis. Electronically Signed   By: Jeb Levering M.D.   On: 03/27/2016 21:54    Cardiac Studies   Prior echocardiogram 03/23/2016 Normal ejection fraction, severe aortic valve stenosis Mean gradient 60, peak 97, peak velocity close to 500 cm/s  Patient Profile     83 y.o. female  with known history of severe aortic stenosis, hypertension, hyperlipidemia, diabetes and prior stroke. She presented with progressive weakness, intermittent fever and severe back pain. According the family, her functional capacity has declined over the last month. We were consulted for elevated troponin.  Assessment & Plan    1.  Elevated Troponin:     Troponin Peak @ 1.64.   then trended downward  demand ischemia in the setting of febrile illness, hypertension on arrival in the setting of severe aortic valve stenosis and anemia.   This is not a non-STEMI Denies  having chest pain through this hospital visit or on arrival  No plan for ischemia workup at this time May need ischemia workup in the future if she is a TAVR candidate  Cont asa/? blocker.  2.  Severe Aortic Stenosis:   Activity is limited   She has f/u in TAVR clinic on Friday if she recovers quickly from this hospital visit perhaps we could delay clinic visit, will talk with family  3.  Hypertensive Heart Dzs w/ Chronic Diastolic CHF:  Exacerbated by severe aortic valve stenosis    BP reasonable on current meds.   Recently started on low dose lasix Will monitor renal function  4.  HL:   Cont statin.  5. DM II:   Per IM.  6.  Anemia of chronic dzs:   H/H stable dating back to 1/26.   poor by mouth intake, albumin 2.9, may need iron studies  7.  ? CAP:   None seen on CXR.  IM treating for presumed CAP  abx per IM.  Discussion with daughter at the bedside about details above  Total encounter time more than 25 minutes  Greater than 50% was spent in counseling and coordination of care with the patient   Signed, Ida Rogue, MD  03/29/2016, 9:20 AM

## 2016-03-29 NOTE — Progress Notes (Signed)
Pt to MRI via bed.  

## 2016-03-29 NOTE — Plan of Care (Signed)
Problem: Pain Managment: Goal: General experience of comfort will improve Outcome: Not Progressing Pt continues to complain of back pain, treated with tramadol & IV morphine, pt did get some relief, but pt hurts with any kind of movement. IV abx administered. Pt using bed pan. Daughter at bedside. Will continue to monitor.

## 2016-03-29 NOTE — Care Management (Signed)
PT and OT consults are pending

## 2016-03-29 NOTE — Plan of Care (Signed)
Problem: Safety: Goal: Ability to remain free from injury will improve Outcome: Progressing Fall precautions in place  Problem: Pain Managment: Goal: General experience of comfort will improve Outcome: Not Progressing Prn medications  Problem: Physical Regulation: Goal: Will remain free from infection Outcome: Not Progressing IV antibiotics  Problem: Tissue Perfusion: Goal: Risk factors for ineffective tissue perfusion will decrease Outcome: Progressing SQ Lovenox

## 2016-03-30 ENCOUNTER — Telehealth: Payer: Self-pay | Admitting: Cardiovascular Disease

## 2016-03-30 DIAGNOSIS — R7881 Bacteremia: Secondary | ICD-10-CM

## 2016-03-30 DIAGNOSIS — B952 Enterococcus as the cause of diseases classified elsewhere: Secondary | ICD-10-CM

## 2016-03-30 DIAGNOSIS — M545 Low back pain: Secondary | ICD-10-CM

## 2016-03-30 LAB — BASIC METABOLIC PANEL
ANION GAP: 9 (ref 5–15)
BUN: 15 mg/dL (ref 6–20)
CHLORIDE: 104 mmol/L (ref 101–111)
CO2: 27 mmol/L (ref 22–32)
Calcium: 8.8 mg/dL — ABNORMAL LOW (ref 8.9–10.3)
Creatinine, Ser: 0.79 mg/dL (ref 0.44–1.00)
GFR calc non Af Amer: >60 mL/min (ref >60)
Glucose, Bld: 193 mg/dL — ABNORMAL HIGH (ref 65–99)
POTASSIUM: 3.9 mmol/L (ref 3.5–5.1)
SODIUM: 140 mmol/L (ref 135–145)

## 2016-03-30 LAB — CBC
HCT: 28.1 % — ABNORMAL LOW (ref 35.0–47.0)
HEMOGLOBIN: 9.5 g/dL — AB (ref 12.0–16.0)
MCH: 30.4 pg (ref 26.0–34.0)
MCHC: 33.9 g/dL (ref 32.0–36.0)
MCV: 89.8 fL (ref 80.0–100.0)
Platelets: 173 10*3/uL (ref 150–440)
RBC: 3.14 MIL/uL — AB (ref 3.80–5.20)
RDW: 14.9 % — ABNORMAL HIGH (ref 11.5–14.5)
WBC: 8.9 10*3/uL (ref 3.6–11.0)

## 2016-03-30 LAB — GLUCOSE, CAPILLARY
Glucose-Capillary: 172 mg/dL — ABNORMAL HIGH (ref 65–99)
Glucose-Capillary: 299 mg/dL — ABNORMAL HIGH (ref 65–99)
Glucose-Capillary: 280 mg/dL — ABNORMAL HIGH (ref 65–99)
Glucose-Capillary: 189 mg/dL — ABNORMAL HIGH (ref 65–99)

## 2016-03-30 MED ORDER — CEFTRIAXONE SODIUM-DEXTROSE 2-2.22 GM-% IV SOLR
2.0000 g | Freq: Two times a day (BID) | INTRAVENOUS | Status: DC
  Administered 2016-03-30 – 2016-03-31 (×2): 2 g via INTRAVENOUS
  Filled 2016-03-30 (×3): qty 50

## 2016-03-30 MED ORDER — SODIUM CHLORIDE 0.9 % IV SOLN
INTRAVENOUS | Status: DC
  Administered 2016-03-31: 20 mL/h via INTRAVENOUS

## 2016-03-30 NOTE — Plan of Care (Signed)
Problem: Pain Managment: Goal: General experience of comfort will improve Outcome: Not Progressing Pt still has constant pain in back with any kind of movement, treated with morphine, pt with relief (fell asleep).   Problem: Physical Regulation: Goal: Will remain free from infection Outcome: Progressing IV abx switched to IV ampicillin

## 2016-03-30 NOTE — Progress Notes (Signed)
Pharmacy Antibiotic Note  [name] is a 83 y.o. female admitted on 03/27/2016 with bacteremia.  Pharmacy has been consulted for ceftriaxone dosing for synergy wtih enterococcal bacteremia and possible endocarditis.  ID is following. Patient is currently prescribed ampicillin 2 g IV q6h.   Plan: Will order ceftriaxone 2 g IV q12h  Height: 5' (152.4 cm) Weight: 157 lb 6.4 oz (71.4 kg) IBW/kg (Calculated) : 45.5  Temp (24hrs), Avg:98.3 F (36.8 C), Min:98.1 F (36.7 C), Max:98.5 F (36.9 C)   Recent Labs Lab 03/25/16 1546 03/27/16 2137 03/27/16 2138 03/29/16 0413 03/30/16 0652  WBC 7.6  --  9.6  --  8.9  CREATININE 0.97  --  0.95 0.83 0.79  LATICACIDVEN  --  0.9  --   --   --     Estimated Creatinine Clearance: 42.1 mL/min (by C-G formula based on SCr of 0.79 mg/dL).    Allergies  Allergen Reactions  . Metformin And Related    Antimicrobials this admission: ceftriaxone 1/31 >>  ampicillin 1/30 >>  Cefepime/vancomycin 1/29 >> 1/30  Dose adjustments this admission:  Microbiology results: 1/28 BCx: Enterococcus faecalis, sensitivities pending 1/28 UCx: Multiple species, suggest recollection   Thank you for allowing pharmacy to be a part of this patient's care.  Lenis Noon, PharmD, BCPS Clinical Pharmacist 03/30/2016 3:54 PM

## 2016-03-30 NOTE — Progress Notes (Addendum)
[facility] [place] NAME: [name]    MR#:  [id number]  DATE OF BIRTH:  1924/09/06  SUBJECTIVE:  CHIEF COMPLAINT:   Chief Complaint  Patient presents with  . Back Pain  . Fever   - still has severe low back pain. But improved than yesterday as able to move in bed. Started on medrol dose pack. MRI spine with no osteomyelitis or fractures noted. - enterococcus bacteremia- on ampicillin. For TEE tomorrow - No fevers now.  REVIEW OF SYSTEMS:  Review of Systems  Constitutional: Positive for malaise/fatigue. Negative for chills and fever.  HENT: Negative for congestion, ear discharge, hearing loss and nosebleeds.   Eyes: Negative for blurred vision and double vision.  Respiratory: Positive for cough. Negative for shortness of breath and wheezing.   Cardiovascular: Negative for chest pain, palpitations and leg swelling.  Gastrointestinal: Negative for abdominal pain, constipation, diarrhea, nausea and vomiting.  Genitourinary: Negative for dysuria.  Musculoskeletal: Positive for back pain.  Neurological: Negative for dizziness, speech change, focal weakness, seizures and headaches.  Psychiatric/Behavioral: Negative for depression.    DRUG ALLERGIES:   Allergies  Allergen Reactions  . Metformin And Related     VITALS:  Blood pressure (!) 125/57, pulse 92, temperature 98.5 F (36.9 C), temperature source Oral, resp. rate 18, height 5' (1.524 m), weight 71.4 kg (157 lb 6.4 oz), SpO2 98 %.  PHYSICAL EXAMINATION:  Physical Exam  GENERAL:  83 y.o.-year-old patient lying in the bed and appears to be in distress secondary to back pain  EYES: Pupils equal, round, reactive to light and accommodation. No scleral icterus. Extraocular muscles intact.  HEENT: Head atraumatic, normocephalic. Oropharynx and nasopharynx clear.  NECK:  Supple, no jugular venous distention. No thyroid enlargement, no tenderness.  LUNGS: Normal breath sounds  bilaterally, no wheezing, rales,rhonchi or crepitation. No use of accessory muscles of respiration. Decreased bibasilar breath sounds CARDIOVASCULAR: S1, S2 normal. No  rubs, or gallops. 3/6 systolic murmur is present ABDOMEN: Soft, nontender, nondistended. Bowel sounds present. No organomegaly or mass.  Complains of lower back pain, no radiation of pain down the legs and no neurological deficits noted But able to maneuver in bed today EXTREMITIES: No pedal edema, cyanosis, or clubbing.  NEUROLOGIC: Cranial nerves II through XII are intact. Muscle strength 5/5 in all extremities. Sensation intact. Gait not checked.  PSYCHIATRIC: The patient is alert and oriented x 3.  SKIN: No obvious rash, lesion, or ulcer.    LABORATORY PANEL:   CBC  Recent Labs Lab 03/30/16 0652  WBC 8.9  HGB 9.5*  HCT 28.1*  PLT 173   ------------------------------------------------------------------------------------------------------------------  Chemistries   Recent Labs Lab 03/27/16 2138  03/30/16 0652  NA 138  < > 140  K 3.5  < > 3.9  CL 102  < > 104  CO2 29  < > 27  GLUCOSE 90  < > 193*  BUN 17  < > 15  CREATININE 0.95  < > 0.79  CALCIUM 8.6*  < > 8.8*  AST 56*  --   --   ALT 20  --   --   ALKPHOS 40  --   --   BILITOT 0.8  --   --   < > = values in this interval not displayed. ------------------------------------------------------------------------------------------------------------------  Cardiac Enzymes  Recent Labs Lab 03/28/16 1654  TROPONINI 0.69*   ------------------------------------------------------------------------------------------------------------------  RADIOLOGY:  Dg Thoracic Spine 2 View  Result Date: 03/29/2016 CLINICAL DATA:  Acute mid back pain worse over the last 3 days. EXAM: THORACIC SPINE 2 VIEWS COMPARISON:  Chest radiograph dated 03/27/2016 and 01/23/2016. FINDINGS: No fracture.  No spondylolisthesis. There is a mild curvature of the mid to lower thoracic  spine, convex the right, with a more significant curvature of the upper lumbar spine, convex to the left. There is loss of disc height with endplate spurring throughout the visualized spine. The bones are diffusely demineralized. No convincing bone lesion. Soft tissues are unremarkable. Findings are similar to the lateral chest radiograph dated 01/23/2016. IMPRESSION: 1. No acute fracture or acute finding. 2. Degenerative changes, spinal curvature and diffuse bony demineralization as detailed. Electronically Signed   By: Lajean Manes M.D.   On: 03/29/2016 08:55   Mr Thoracic Spine W Wo Contrast  Result Date: 03/29/2016 CLINICAL DATA:  Acute back pain.  Fever. EXAM: MRI THORACIC AND LUMBAR SPINE WITHOUT AND WITH CONTRAST TECHNIQUE: Multiplanar and multiecho pulse sequences of the thoracic and lumbar spine were obtained without and with intravenous contrast. CONTRAST:  69mL MULTIHANCE GADOBENATE DIMEGLUMINE 529 MG/ML IV SOLN COMPARISON:  None. FINDINGS: MRI THORACIC SPINE FINDINGS Alignment: Mild exaggeration of thoracic kyphosis. Rotation related to scoliosis. Vertebrae: Large hemangioma in T10, involving the entire body and extending into the left pedicle. Lesion extent is stable since 2016 abdominal CT. Neighboring epidural venous plexus enhancement is prominent, likely reactive - no suspected aggressive features. No acute fracture, metastasis, or discitis. Cord:  Normal signal and morphology. Paraspinal and other soft tissues: Trace layering pleural effusions. Disc levels: Diffuse degenerative disc bulging, spondylosis, and facet spurring and ligament thickening. Generalized foraminal narrowing greatest in the lower thoracic levels. No cord impingement. MRI LUMBAR SPINE FINDINGS Segmentation:  Standard. Alignment:  Moderate levoscoliosis Vertebrae: There is multiple levels of disc T2 hyperintensity without endplate erosion, considered degenerative. No spondylitis. No acute fracture or aggressive bone lesion.  Conus medullaris: Extends to the L1 level and appears normal. Paraspinal and other soft tissues: On STIR imaging there is suggestion of edema along the right psoas and paravertebral space, but no associated enhancement to suggest inflammation or strain. Additionally, the structures appears symmetric on axial acquisitions. Disc levels: T12- L1: Asymmetric right disc narrowing and spurring with moderate right foraminal stenosis. Asymmetric facet spurring to the right. L1-L2: Asymmetric right disc narrowing and endplate spurring. Asymmetric right facet spurring and ligament thickening. Moderate right foraminal stenosis. L2-L3: Asymmetric rightward disc narrowing and endplate ridging. Facet arthropathy with moderate spurring. Right subarticular recesses and moderate foraminal narrowing. L3-L4: Disc narrowing that is asymmetric to the right where there is also bulky endplate spurring. Bilateral facet overgrowth and ligament thickening. Mild subarticular recess and foraminal narrowing. L4-L5: Disc narrowing asymmetric to the left. Facet spurring greater to the left. Moderate spinal stenosis. Either L5 nerve could be affected in the subarticular recesses. L5-S1:Degenerative disc narrowing with endplate ridging. Moderate bilateral foraminal stenosis. No spinal stenosis. IMPRESSION: 1. No acute finding in the thoracic or lumbar spine. 2. Lumbar levoscoliosis and multilevel foraminal narrowing. L4-5 moderate spinal stenosis. 3. Incidental large T10 hemangioma. Electronically Signed   By: Monte Fantasia M.D.   On: 03/29/2016 16:15   Mr Lumbar Spine W Wo Contrast  Result Date: 03/29/2016 CLINICAL DATA:  Acute back pain.  Fever. EXAM: MRI THORACIC AND LUMBAR SPINE WITHOUT AND WITH CONTRAST TECHNIQUE: Multiplanar and multiecho pulse sequences of the thoracic and lumbar spine were obtained without and with intravenous contrast. CONTRAST:  58mL MULTIHANCE GADOBENATE DIMEGLUMINE 529 MG/ML IV SOLN COMPARISON:  None.  FINDINGS:  MRI THORACIC SPINE FINDINGS Alignment: Mild exaggeration of thoracic kyphosis. Rotation related to scoliosis. Vertebrae: Large hemangioma in T10, involving the entire body and extending into the left pedicle. Lesion extent is stable since 2016 abdominal CT. Neighboring epidural venous plexus enhancement is prominent, likely reactive - no suspected aggressive features. No acute fracture, metastasis, or discitis. Cord:  Normal signal and morphology. Paraspinal and other soft tissues: Trace layering pleural effusions. Disc levels: Diffuse degenerative disc bulging, spondylosis, and facet spurring and ligament thickening. Generalized foraminal narrowing greatest in the lower thoracic levels. No cord impingement. MRI LUMBAR SPINE FINDINGS Segmentation:  Standard. Alignment:  Moderate levoscoliosis Vertebrae: There is multiple levels of disc T2 hyperintensity without endplate erosion, considered degenerative. No spondylitis. No acute fracture or aggressive bone lesion. Conus medullaris: Extends to the L1 level and appears normal. Paraspinal and other soft tissues: On STIR imaging there is suggestion of edema along the right psoas and paravertebral space, but no associated enhancement to suggest inflammation or strain. Additionally, the structures appears symmetric on axial acquisitions. Disc levels: T12- L1: Asymmetric right disc narrowing and spurring with moderate right foraminal stenosis. Asymmetric facet spurring to the right. L1-L2: Asymmetric right disc narrowing and endplate spurring. Asymmetric right facet spurring and ligament thickening. Moderate right foraminal stenosis. L2-L3: Asymmetric rightward disc narrowing and endplate ridging. Facet arthropathy with moderate spurring. Right subarticular recesses and moderate foraminal narrowing. L3-L4: Disc narrowing that is asymmetric to the right where there is also bulky endplate spurring. Bilateral facet overgrowth and ligament thickening. Mild subarticular recess  and foraminal narrowing. L4-L5: Disc narrowing asymmetric to the left. Facet spurring greater to the left. Moderate spinal stenosis. Either L5 nerve could be affected in the subarticular recesses. L5-S1:Degenerative disc narrowing with endplate ridging. Moderate bilateral foraminal stenosis. No spinal stenosis. IMPRESSION: 1. No acute finding in the thoracic or lumbar spine. 2. Lumbar levoscoliosis and multilevel foraminal narrowing. L4-5 moderate spinal stenosis. 3. Incidental large T10 hemangioma. Electronically Signed   By: Monte Fantasia M.D.   On: 03/29/2016 16:15    EKG:   Orders placed or performed during the hospital encounter of 03/27/16  . EKG 12-Lead  . EKG 12-Lead  . ED EKG 12-Lead  . ED EKG 12-Lead    ASSESSMENT AND PLAN:   83 year old female with multiple medical problems including severe stenosis, arthritis, depression, diabetes, hypertension and history of stroke presents to hospital secondary to worsening back pain.  #1 Acute low back pain- no trauma, thoracic and lumbar spine X rays negative for any fractures, has severe osteopenia and degenerative changes. -No neurological deficits noted on exam. Medrol dose pack ordered with some improvement - also MRI thoracic and lumbar spines ordered - negative for osteomyelitis esp with fevers on admission and back pain and positive blood cultures -Pain medications and muscle relaxants ordered along with Lidoderm patch. - monitor as slow improvement noted. Incidental T10 hemangioma noted, but her pain is much lower- since slowly improving, encourage PT and outpatient physiotherapy consult for steroid epidural injection  #2 elevated troponin-demand ischemia, also has severe aortic stenosis. -Appreciate cardiology input. Continue cardiac medications. Patient has severe aortic stenosis  #3 Enteroccocus bacteremia- unknown source at this time Appreciate ID input- started on IV ampicillin and also rocephin for synergistic affect Recent  ECHO with no vegetations, MRI of spine with no osteomyelitis - for TEE tomorrow especially she is high risk for endocarditis with her severe aortic stenosis  #4 hypertension-continue Lasix, oral hydralazine, Imdur, lisinopril and metoprolol.  #5 diastolic  CHF-appears stable. Continue low-dose Lasix daily  #6 DVT prophylaxis-on Lovenox.  Physical therapy consult once pain is improved    All the records are reviewed and case discussed with Care Management/Social Workerr. Management plans discussed with the patient, family and they are in agreement.  CODE STATUS: Full Code  TOTAL TIME TAKING CARE OF THIS PATIENT: 37 minutes.   POSSIBLE D/C IN 2 DAYS, DEPENDING ON CLINICAL CONDITION.   Gladstone Lighter M.D on 03/30/2016 at 3:43 PM  Between 7am to 6pm - Pager - [phone]  After 6pm go to [email or web address] - password [email or web address] [facility]  Office  [phone]  CC: Primary care physician; [name], MD

## 2016-03-30 NOTE — Progress Notes (Signed)
Progress Note  Patient Name: MARGENE CHERIAN Date of Encounter: 03/30/2016  Primary Cardiologist: Rockey Situ, tim  Subjective   Back pain improving Family reports she is able to roll on her side, previously was unable to do that Has not been out of bed, no weightbearing Lab work reviewed, case discussed with ID and medicine service Enterococcus growing in blood drawn from 03/27/2016 MRI of back results discussed with family  Inpatient Medications    Scheduled Meds: . allopurinol  300 mg Oral Daily  . ampicillin (OMNIPEN) IV  2 g Intravenous Q6H  . aspirin  81 mg Oral Daily  . brimonidine  1 drop Right Eye BID  . cholecalciferol  2,000 Units Oral Daily  . docusate sodium  100 mg Oral BID  . dorzolamide  1 drop Right Eye BID   And  . timolol  1 drop Right Eye BID  . doxazosin  4 mg Oral BID  . enoxaparin (LOVENOX) injection  40 mg Subcutaneous Q24H  . feeding supplement (ENSURE ENLIVE)  237 mL Oral BID BM  . furosemide  20 mg Oral Daily  . gabapentin  300 mg Oral BID  . hydrALAZINE  25 mg Oral TID  . insulin aspart  0-15 Units Subcutaneous TID WC  . insulin aspart  0-5 Units Subcutaneous QHS  . iron polysaccharides  150 mg Oral BID  . isosorbide mononitrate  60 mg Oral BID  . latanoprost  1 drop Right Eye QHS  . lidocaine  1 patch Transdermal Q24H  . lisinopril  40 mg Oral QHS  . metaxalone  400 mg Oral TID  . methylPREDNISolone  4 mg Oral 3 x daily with food  . [START ON 03/31/2016] methylPREDNISolone  4 mg Oral 4X daily taper  . methylPREDNISolone  8 mg Oral Nightly  . metoprolol succinate  75 mg Oral Daily  . pantoprazole  40 mg Oral BID  . pilocarpine  1 drop Right Eye QID  . potassium chloride  10 mEq Oral Daily  . pravastatin  40 mg Oral q1800  . sodium chloride flush  3 mL Intravenous Q12H   Continuous Infusions:  PRN Meds: acetaminophen **OR** acetaminophen, benzonatate, morphine injection, ondansetron **OR** ondansetron (ZOFRAN) IV, traMADol, trolamine  salicylate   Vital Signs    Vitals:   03/29/16 2021 03/30/16 0546 03/30/16 0832 03/30/16 1146  BP: (!) 128/53 132/68 137/62 (!) 117/53  Pulse: 90 86 87 92  Resp: 16 16 18 18   Temp: 98.1 F (36.7 C) 98.2 F (36.8 C) 98.2 F (36.8 C) 98.5 F (36.9 C)  TempSrc: Oral Oral Oral Oral  SpO2: 96% 96% 95% 95%  Weight:  157 lb 6.4 oz (71.4 kg)    Height:        Intake/Output Summary (Last 24 hours) at 03/30/16 1528 Last data filed at 03/30/16 1440  Gross per 24 hour  Intake              210 ml  Output              100 ml  Net              110 ml   Filed Weights   03/28/16 0658 03/29/16 0439 03/30/16 0546  Weight: 156 lb 3.2 oz (70.9 kg) 155 lb 14.4 oz (70.7 kg) 157 lb 6.4 oz (71.4 kg)    Telemetry    Normal sinus rhythm   Physical Exam   GEN: No acute distress. Answers questions with yes  or no Neck: No JVD  Cardiac: RRR, 3/6 SEM RSB,  No rubs, or gallops.  Radials/DP/PT 2+ and equal bilaterally.  Respiratory:  Clear to auscultation bilaterally. GI: Soft, nontender, non-distended  MS: no deformity; no edema Neuro:  Alert , not very communicative  Labs    Chemistry Recent Labs Lab 03/25/16 1546 03/27/16 2138 03/29/16 0413 03/30/16 0652  NA 138 138 139 140  K 3.8 3.5 3.6 3.9  CL 102 102 105 104  CO2 30 29 29 27   GLUCOSE 109* 90 134* 193*  BUN 14 17 13 15   CREATININE 0.97 0.95 0.83 0.79  CALCIUM 8.9 8.6* 8.4* 8.8*  PROT 6.8 6.4*  --   --   ALBUMIN 3.4* 2.9*  --   --   AST 32 56*  --   --   ALT 15 20  --   --   ALKPHOS 42 40  --   --   BILITOT 0.9 0.8  --   --   GFRNONAA 50* 52* >60 >60  GFRAA 58* 60* >60 >60  ANIONGAP 6 7 5 9      Hematology Recent Labs Lab 03/25/16 1546 03/27/16 2138 03/30/16 0652  WBC 7.6 9.6 8.9  RBC 3.33* 3.19* 3.14*  HGB 10.0* 10.0* 9.5*  HCT 30.2* 28.5* 28.1*  MCV 90.9 89.5 89.8  MCH 30.1 31.4 30.4  MCHC 33.2 35.1 33.9  RDW 14.9* 14.6* 14.9*  PLT 169 180 173    Cardiac Enzymes Recent Labs Lab 03/27/16 2138  03/28/16 0444 03/28/16 1148 03/28/16 1654  TROPONINI 1.64* 1.09* 0.76* 0.69*   No results for input(s): TROPIPOC in the last 168 hours.   BNPNo results for input(s): BNP, PROBNP in the last 168 hours.   DDimer No results for input(s): DDIMER in the last 168 hours.   Radiology    Dg Thoracic Spine 2 View  Result Date: 03/29/2016  IMPRESSION: 1. No acute fracture or acute finding. 2. Degenerative changes, spinal curvature and diffuse bony demineralization as detailed. Electronically Signed   By: Lajean Manes M.D.   On: 03/29/2016 08:55   Mr Thoracic Spine W Wo Contrast  Result Date: 03/29/2016  IMPRESSION: 1. No acute finding in the thoracic or lumbar spine. 2. Lumbar levoscoliosis and multilevel foraminal narrowing. L4-5 moderate spinal stenosis. 3. Incidental large T10 hemangioma. Electronically Signed   By: Monte Fantasia M.D.   On: 03/29/2016 16:15   Mr Lumbar Spine W Wo Contrast  Result Date: 03/29/2016 . IMPRESSION: 1. No acute finding in the thoracic or lumbar spine. 2. Lumbar levoscoliosis and multilevel foraminal narrowing. L4-5 moderate spinal stenosis. 3. Incidental large T10 hemangioma. Electronically Signed   By: Monte Fantasia M.D.   On: 03/29/2016 16:15    Cardiac Studies   TEE pending  Patient Profile     83 y.o. female   [place]    1. Elevated Troponin:   Troponin Peak @ 1.64.  then trended downward  demand ischemia in the setting of febrile illness, hypertension on arrival in the setting of severe aortic valve stenosis and anemia.  This is not a non-STEMI May need ischemia workup in the future if she is a TAVR candidate Cont asa/?blocker.  2. Severe Aortic Stenosis:  Given enterococcus bacteremia, we have delayed her visit to TAVR clinic Currently not a good candidate TEE scheduled tomorrow to help guide duration of antibiotics Discussed in detail with infectious disease and medicine service If inconclusive on TEE, may need longer  course of antibiotics  3. Hypertensive Heart Dzs w/ Chronic Diastolic CHF:  Exacerbated by severe aortic valve stenosis  BP reasonable on current meds.  On low-dose Lasix, stable renal function  4. HL:  Cont statin.  5. DM II:  Per IM.  6. Anemia of chronic dzs:  H/H stable dating back to 1/26.  poor by mouth intake, albumin 2.9, may need iron studies  7) enterococcus bacteremia Long discussion with family at the bedside this morning Discussed case with ID, medicine service TEE tomorrow morning to help guide therapy/antibiotic duration   Discussion with daughter at the bedside about details above  Total encounter time more than 35 minutes  Greater than 50% was spent in counseling and coordination of care with the patient   Signed, Ida Rogue, MD  03/30/2016, 3:28 PM

## 2016-03-30 NOTE — Telephone Encounter (Signed)
Patient is currently admitted and will not be able to make her appointment. Will cancel this and route message to Dr. Antionette Char nurse to make her aware to reschedule when patient is discharged.

## 2016-03-30 NOTE — Progress Notes (Signed)
PT Cancellation Note  Patient Details Name: [name] MRN: [id number] DOB: 11/14/1924   Cancelled Treatment:    Reason Eval/Treat Not Completed: Patient declined, no reason specified.  Pt's family declining PT (d/t pt's pain levels today) and pt also declining PT today (reporting not feeling up to PT at this time but would try tomorrow).  Will re-attempt PT eval at a later date/time.   Raquel Sarna Jaliza Seifried 03/30/2016, 3:13 PM Leitha Bleak, [place]

## 2016-03-31 ENCOUNTER — Encounter
Admission: EM | Disposition: A | Discharge status: Home / Self Care | Payer: Self-pay | Source: Home / Self Care | Attending: Internal Medicine

## 2016-03-31 ENCOUNTER — Inpatient Hospital Stay (HOSPITAL_COMMUNITY)
Admit: 2016-03-31 | Discharge: 2016-03-31 | Disposition: A | Discharge status: Home / Self Care | Payer: Medicare HMO | Attending: Internal Medicine | Admitting: Internal Medicine

## 2016-03-31 DIAGNOSIS — I350 Nonrheumatic aortic (valve) stenosis: Secondary | ICD-10-CM

## 2016-03-31 HISTORY — PX: TEE WITHOUT CARDIOVERSION: SHX5443

## 2016-03-31 LAB — CULTURE, BLOOD (ROUTINE X 2)

## 2016-03-31 LAB — BASIC METABOLIC PANEL
Anion gap: 6 (ref 5–15)
BUN: 21 mg/dL — AB (ref 6–20)
CALCIUM: 9.2 mg/dL (ref 8.9–10.3)
CO2: 31 mmol/L (ref 22–32)
CREATININE: 0.83 mg/dL (ref 0.44–1.00)
Chloride: 104 mmol/L (ref 101–111)
GFR calc non Af Amer: >60 mL/min (ref >60)
Glucose, Bld: 217 mg/dL — ABNORMAL HIGH (ref 65–99)
Potassium: 4 mmol/L (ref 3.5–5.1)
SODIUM: 141 mmol/L (ref 135–145)

## 2016-03-31 LAB — GLUCOSE, CAPILLARY
Glucose-Capillary: 199 mg/dL — ABNORMAL HIGH (ref 65–99)
Glucose-Capillary: 136 mg/dL — ABNORMAL HIGH (ref 65–99)
Glucose-Capillary: 240 mg/dL — ABNORMAL HIGH (ref 65–99)
Glucose-Capillary: 195 mg/dL — ABNORMAL HIGH (ref 65–99)

## 2016-03-31 LAB — C-REACTIVE PROTEIN: CRP: 9.8 mg/dL — AB (ref <1.0)

## 2016-03-31 LAB — SEDIMENTATION RATE: Sed Rate: 73 mm/hr — ABNORMAL HIGH (ref 0–30)

## 2016-03-31 SURGERY — ECHOCARDIOGRAM, TRANSESOPHAGEAL
Anesthesia: Moderate Sedation

## 2016-03-31 MED ORDER — FENTANYL CITRATE (PF) 100 MCG/2ML IJ SOLN
INTRAMUSCULAR | Status: AC
  Filled 2016-03-31: qty 4

## 2016-03-31 MED ORDER — MIDAZOLAM HCL 5 MG/5ML IJ SOLN
INTRAMUSCULAR | Status: AC
  Administered 2016-03-31: 10:00:00
  Filled 2016-03-31: qty 5

## 2016-03-31 MED ORDER — FENTANYL CITRATE (PF) 100 MCG/2ML IJ SOLN
INTRAMUSCULAR | Status: AC
  Administered 2016-03-31: 10:00:00
  Filled 2016-03-31: qty 2

## 2016-03-31 MED ORDER — MIDAZOLAM HCL 2 MG/2ML IJ SOLN
INTRAMUSCULAR | Status: AC | PRN
  Administered 2016-03-31 (×2): 1 mg via INTRAVENOUS

## 2016-03-31 MED ORDER — MIDAZOLAM HCL 5 MG/5ML IJ SOLN
INTRAMUSCULAR | Status: AC
  Administered 2016-03-31: 08:00:00
  Filled 2016-03-31: qty 10

## 2016-03-31 MED ORDER — LIDOCAINE VISCOUS 2 % MT SOLN
OROMUCOSAL | Status: AC
  Administered 2016-03-31: 08:00:00
  Filled 2016-03-31: qty 15

## 2016-03-31 MED ORDER — BUTAMBEN-TETRACAINE-BENZOCAINE 2-2-14 % EX AERO
INHALATION_SPRAY | CUTANEOUS | Status: AC
  Administered 2016-03-31: 08:00:00
  Filled 2016-03-31: qty 20

## 2016-03-31 MED ORDER — SODIUM CHLORIDE FLUSH 0.9 % IV SOLN
INTRAVENOUS | Status: AC
Start: 2016-03-31 — End: 2016-03-31
  Filled 2016-03-31: qty 20

## 2016-03-31 MED ORDER — ENOXAPARIN SODIUM 40 MG/0.4ML ~~LOC~~ SOLN
40.0000 mg | SUBCUTANEOUS | Status: DC
  Administered 2016-03-31 – 2016-04-01 (×2): 40 mg via SUBCUTANEOUS
  Filled 2016-03-31 (×2): qty 0.4

## 2016-03-31 MED ORDER — FENTANYL CITRATE (PF) 100 MCG/2ML IJ SOLN
INTRAMUSCULAR | Status: AC | PRN
  Administered 2016-03-31 (×2): 25 ug via INTRAVENOUS

## 2016-03-31 NOTE — Evaluation (Signed)
Occupational Therapy Evaluation Patient Details Name: [name] MRN: [id number] DOB: 12/01/1924 Today's Date: 03/31/2016    History of Present Illness 83 year old female with multiple medical problems including severe stenosis, arthritis, depression, diabetes, hypertension and history of stroke presents to hospital secondary to worsening back pain on 1/29. Spinal x-rays confirm no trauma/fractures but pt does have severe osteopenia and degenerative changes; cardiac consult confirmed no valvular endocarditis and moderate aortic valve stenosis - nonsurgical at this time. Per case mgr, pt will require IV antibiotics long term.   Clinical Impression   OT evaluation completed on 3rd attempt to see pt. Pt lives alone (has life alert pendant she keeps bedside) and was independent with sponge bathing outside of the tub, dressing, toileting and functional mobility prior to hospitalization. Pt recieved assistance for her medication mgt, hair care, meals (family initially prepared, but recently start receiving meals on wheels assistance), and going out in community as pt has not driven in a while. Pt is "very independent natured" and enjoys attending church services, shopping, and listening to [employer]. Pt alert and oriented x3 (cueing for date/time) and agreeable to OT. Pt presents with 7/10 middle back pain and some sensitivity in 1st toe on each foot. Pt currently on morphine pain medications. Pt reports back pain is much better than before and agreeable to bed mobility and completed bed level toileting task. Pt presents with pain, decreased strength/activity tolerance (able to sit EOB with min guard for 3 minutes)/knowledge of AE and DME for self care and increased need for assistance with functional mobility and self care tasks. Pt would benefit from skilled OT services to address noted impairments and functional deficits in order to maximize return to PLOF and minimize falls risk. Pt is a good  candidate for SNF for STR prior to return home.     Follow Up Recommendations  SNF    Equipment Recommendations  3 in 1 bedside commode    Recommendations for Other Services       Precautions / Restrictions Precautions Precautions: Fall Restrictions Weight Bearing Restrictions: No      Mobility Bed Mobility Overal bed mobility: Needs Assistance Bed Mobility: Rolling;Supine to Sit;Sit to Supine Rolling: Modified independent (Device/Increase time);Supervision   Supine to sit: HOB elevated;Min guard Sit to supine: Min assist   General bed mobility comments: Additional time to complete all movements; verbal cues to use bed rails to assist  Transfers                 General transfer comment: did not attempt out of bed due to pt fatigue    Balance Overall balance assessment: Needs assistance Sitting-balance support: Bilateral upper extremity supported;Feet supported Sitting balance-Leahy Scale: Fair                                      ADL Overall ADL's : Needs assistance/impaired Eating/Feeding: Bed level;Set up Eating/Feeding Details (indicate cue type and reason): dtr reports pt currently sipping Ensure and applesauce Grooming: Set up;Supervision/safety;Bed level   Upper Body Bathing: Sitting;Set up;Moderate assistance   Lower Body Bathing: Maximal assistance;Bed level   Upper Body Dressing : Minimal assistance;Sitting   Lower Body Dressing: Maximal assistance;Bed level     Toilet Transfer Details (indicate cue type and reason): did not attempt toilet transfer out of bed; pt performed rolling side to side for bed pan toileting task during session Toileting- Clothing Manipulation  and Hygiene: Set up;Bed level Toileting - Clothing Manipulation Details (indicate cue type and reason): pt able to perform hygiene indep after set up from bed level while in side lying       General ADL Comments: Pt fatigues easily, limited by pain (which  currently controlled well during session with morphine), requiring greater assist for ADL than at baseline     Vision Vision Assessment?: No apparent visual deficits   Perception     Praxis      Pertinent Vitals/Pain Pain Assessment: 0-10 Pain Score: 7  Pain Location: middle back, great toe on both feet also sensitive Pain Intervention(s): Limited activity within patient's tolerance;Monitored during session;Premedicated before session;Repositioned (dtr reports pt currently taking morphine for pain)     Hand Dominance     Extremity/Trunk Assessment Upper Extremity Assessment Upper Extremity Assessment: Generalized weakness (BUE 4-/5)   Lower Extremity Assessment Lower Extremity Assessment: Defer to PT evaluation       Communication Communication Communication: No difficulties   Cognition Arousal/Alertness: Awake/alert Behavior During Therapy: WFL for tasks assessed/performed Overall Cognitive Status: Impaired/Different from baseline Area of Impairment: Orientation Orientation Level: Disoriented to;Time             General Comments: Other than date/time, pt alert and oriented   General Comments       Exercises   Other Exercises Other Exercises: pt/dtr educated in home mods/AE for safety at home to max functional independence in home   Shoulder Instructions      Home Living Family/patient expects to be discharged to:: Private residence Living Arrangements: Alone Available Help at Discharge: Family;Available PRN/intermittently Type of Home: House Home Access: Ramped entrance     Home Layout: One level     Bathroom Shower/Tub: Tub/shower unit;Walk-in shower (pt takes sponge bath outside of tub, walk-in shower is small) Shower/tub characteristics: Curtain Biochemist, clinical: Handicapped height Bathroom Accessibility: Yes How Accessible: Accessible via walker Home Equipment: Shower seat          Prior Functioning/Environment Level of Independence:  Independent        Comments: Pt lives alone and was independent with sponge bathing outside of the tub, dressing, toileting and functional mobility prior to hospitalization. Pt recieved assistance for her medication mgt, hair care, meals (family initially prepared, but recently start receiving meals on wheels assistance), and going out in community as pt has not driven in a while. Pt is "very independent natured" and enjoys attending church services, shopping, and listening to [employer] music        OT Problem List: Decreased strength;Pain;Decreased activity tolerance;Decreased knowledge of use of DME or AE   OT Treatment/Interventions: Self-care/ADL training;DME and/or AE instruction;Energy conservation;Patient/family education;Therapeutic exercise;Therapeutic activities    OT Goals(Current goals can be found in the care plan section) Acute Rehab OT Goals Patient Stated Goal: get better OT Goal Formulation: With patient/family Time For Goal Achievement: 04/14/16 Potential to Achieve Goals: Good  OT Frequency: Min 1X/week   Barriers to D/C: Decreased caregiver support          Co-evaluation              End of Session    Activity Tolerance: Patient limited by fatigue;No increased pain Patient left: in bed;with call bell/phone within reach;with bed alarm set;with family/visitor present   Time: 1305-1405 OT Time Calculation (min): 60 min Charges:  OT General Charges $OT Visit: 1 Procedure OT Evaluation $OT Eval Moderate Complexity: 1 Procedure OT Treatments $Self Care/Home Management : 38-52 mins G-Codes:  Corky Sox, OTR/L 03/31/2016, 2:54 PM

## 2016-03-31 NOTE — Progress Notes (Signed)
TEE performed this morning for bacteremia, rule out endocarditis, severe aortic valve stenosis  There is no evidence of valvular endocarditis Moderate to severely calcified aortic valve Surprisingly aortic valve opens more than expected based on transthoracic echocardiogram Valve opening greater than 1 cm, some measurements 1.4 cm This would estimate valve stenosis at moderate, nonsurgical at this time  Mildly dilated left atrium Normal LV function, normal RV function, There is a small PFO with left-to-right Doppler shunt, positive bubble study at rest Moderate diffuse aortic atherosclerosis of the arch, descending aorta Full report to follow  Signed, Esmond Plants, MD, Ph.D [facility]

## 2016-03-31 NOTE — Progress Notes (Signed)
*  PRELIMINARY RESULTS* Echocardiogram Echocardiogram Transesophageal has been performed.  Sherrie Sport 03/31/2016, 10:16 AM

## 2016-03-31 NOTE — Progress Notes (Signed)
OT Cancellation Note  Patient Details Name: [name] MRN: [id number] DOB: 11/13/1924   Cancelled Treatment:    Reason Eval/Treat Not Completed: Patient at procedure or test/ unavailable (Pt. having an EET procedure.)  Harrel Carina, MS, OTR/L 03/31/2016, 10:16 AM

## 2016-03-31 NOTE — Progress Notes (Signed)
Infectious Disease Long Term IV Antibiotic Orders  Diagnosis: Enterococcal bacteremia- TEE negative   Culture results Order Status: Completed Specimen: Blood from BLOOD Updated: 03/31/16 1126   Specimen Description BLOOD RIGHT ANTECUBITAL   Special Requests BOTTLES DRAWN AEROBIC AND ANAEROBIC AER 10CC, ANA 8CC   Culture Setup Time --   GRAM POSITIVE COCCI  IN BOTH AEROBIC AND ANAEROBIC BOTTLES  CRITICAL VALUE NOTED. VALUE IS CONSISTENT WITH PREVIOUSLY REPORTED AND CALLED VALUE.    Culture ENTEROCOCCUS FAECALIS (A)   Report Status 03/31/2016 FINAL   Organism ID, Bacteria ENTEROCOCCUS FAECALIS  Culture & Susceptibility   ENTEROCOCCUS FAECALIS   Antibiotic Sensitivity Microscan Status  AMPICILLIN Sensitive <=2 SENSITIVE Final  Method: MIC  GENTAMICIN SYNERGY Sensitive SENSITIVE Final  Method: MIC  VANCOMYCIN Sensitive 1 SENSITIVE Final  Method: MIC  Comments ENTEROCOCCUS FAECALIS (MIC)   ENTEROCOCCUS FAECALIS             Allergies:  Allergies  Allergen Reactions  . Metformin And Related     Discharge antibiotics Ampicillin  2 grams every  6 hours (or 8 gms continuous q 24)  PICC Care per protocol Labs weekly while on IV antibiotics      CBC w diff   Comprehensive met panel CRP    Planned duration of antibiotics 2 weeks from 1/30  Stop date 2/12 Follow up clinic date Before stop date  FAX weekly labs to [phone]  Leonel Ramsay, MD

## 2016-03-31 NOTE — Care Management (Signed)
Patient is going to require long term IV antibiotics. PT and OT consult pending.  Patient declined therapy on 1.31 and informed when physical therapy presented today patient was off the unit for testing.  Discussed during progression of need to have patient evaluated by PT and OT today because if needs skilled nursing, will require prior approval from insurance.  If patient requires skilled nursing family is in agreement.  If patient is able to discharge home, there are family members that are willing and capable of learning how to administer IV antibiotics. Gave heads up referral to [facility] for IV, SN PT OT Aide. If blood cultures are negative, will have PICC today. TEE was negative. Updated CSW

## 2016-03-31 NOTE — NC FL2 (Signed)
[place] LEVEL OF CARE SCREENING TOOL     IDENTIFICATION  Patient Name: [name] Birthdate: [birth year] Sex: female Admission Date (Current Location): 03/27/2016  [place] and [place] Number:  Engineering geologist and Address:  [facility], [address] [place], [place] [address]      Provider Number: [id number]  Attending Physician Name and Address:  Fritzi Mandes, MD  Relative Name and Phone Number:       Current Level of Care: Hospital Recommended Level of Care: [place] Prior Approval Number:    Date Approved/Denied:   PASRR Number:    Discharge Plan: SNF    Current Diagnoses: Patient Active Problem List   Diagnosis Date Noted  . Enterococcal bacteremia 03/30/2016  . Viral upper respiratory tract infection   . Acute back pain   . Sepsis ([place])   . Elevated troponin   . CAP (community acquired pneumonia) 03/28/2016  . Leg swelling 09/14/2015  . Essential hypertension   . Hyperlipidemia   . Diabetes mellitus without complication ([place])   . Paroxysmal SVT (supraventricular tachycardia) ([place])   . Multinodular goiter   . Carotid arterial disease ([place])   . Hypotension   . Severe aortic valve stenosis   . NSTEMI (non-ST elevated myocardial infarction) ([place]) 09/10/2014  . Renal failure 08/28/2014  . SVT (supraventricular tachycardia) ([place]) 12/17/2012  . Diabetes mellitus, type 2 ([place]) 12/17/2012  . SOB (shortness of breath) 12/17/2012  . Essential hypertension, malignant 12/17/2012  . Chronic diastolic CHF (congestive heart failure) ([place]) 12/17/2012    Orientation RESPIRATION BLADDER Height & Weight     Self, Place, Situation  Normal Continent Weight: 154 lb (69.9 kg) Height:  5\' 1"  (154.9 cm)  BEHAVIORAL SYMPTOMS/MOOD NEUROLOGICAL BOWEL NUTRITION STATUS   (none)  (none) Continent Diet (heart healthy/carb modified)  AMBULATORY STATUS COMMUNICATION OF NEEDS Skin   Limited Assist Verbally Normal                        Personal Care Assistance Level of Assistance  Bathing, Dressing Bathing Assistance: Limited assistance   Dressing Assistance: Limited assistance     Functional Limitations Info   (no issues)          SPECIAL CARE FACTORS FREQUENCY  PT (By licensed PT)                    Contractures Contractures Info: Not present    Additional Factors Info  Code Status, Allergies Code Status Info: full Allergies Info: metformin           Current Medications (03/31/2016):  This is the current hospital active medication list Current Facility-Administered Medications  Medication Dose Route Frequency Provider Last Rate Last Dose  . acetaminophen (TYLENOL) tablet 650 mg  650 mg Oral Q6H PRN Harrie Foreman, MD       Or  . acetaminophen (TYLENOL) suppository 650 mg  650 mg Rectal Q6H PRN Harrie Foreman, MD      . allopurinol (ZYLOPRIM) tablet 300 mg  300 mg Oral Daily Harrie Foreman, MD   300 mg at 03/30/16 7253  . ampicillin (OMNIPEN) 2 g in sodium chloride 0.9 % 50 mL IVPB  2 g Intravenous Q6H Leonel Ramsay, MD   2 g at 03/31/16 1247  . aspirin chewable tablet 81 mg  81 mg Oral Daily Harrie Foreman, MD   81 mg at 03/30/16 6644  . benzonatate (TESSALON)  capsule 200 mg  200 mg Oral TID PRN Harrie Foreman, MD   200 mg at 03/29/16 2113  . brimonidine (ALPHAGAN) 0.15 % ophthalmic solution 1 drop  1 drop Right Eye BID Harrie Foreman, MD   1 drop at 03/30/16 2220  . cholecalciferol (VITAMIN D) tablet 2,000 Units  2,000 Units Oral Daily Harrie Foreman, MD   2,000 Units at 03/30/16 [phone]  . docusate sodium (COLACE) capsule 100 mg  100 mg Oral BID Harrie Foreman, MD   100 mg at 03/30/16 2218  . dorzolamide (TRUSOPT) 2 % ophthalmic solution 1 drop  1 drop Right Eye BID Harrie Foreman, MD   1 drop at 03/30/16 2220   And  . timolol (TIMOPTIC) 0.25 % ophthalmic solution 1 drop  1 drop Right Eye BID Harrie Foreman, MD   1 drop at 03/30/16 2220  .  doxazosin (CARDURA) tablet 4 mg  4 mg Oral BID Harrie Foreman, MD   4 mg at 03/30/16 2218  . enoxaparin (LOVENOX) injection 40 mg  40 mg Subcutaneous Q24H Fritzi Mandes, MD      . feeding supplement (ENSURE ENLIVE) (ENSURE ENLIVE) liquid 237 mL  237 mL Oral BID BM Gladstone Lighter, MD   237 mL at 03/31/16 1248  . furosemide (LASIX) tablet 20 mg  20 mg Oral Daily Harrie Foreman, MD   20 mg at 03/30/16 4580  . gabapentin (NEURONTIN) capsule 300 mg  300 mg Oral BID Harrie Foreman, MD   300 mg at 03/30/16 2218  . hydrALAZINE (APRESOLINE) tablet 25 mg  25 mg Oral TID Harrie Foreman, MD   25 mg at 03/31/16 1614  . insulin aspart (novoLOG) injection 0-15 Units  0-15 Units Subcutaneous TID WC Harrie Foreman, MD   2 Units at 03/31/16 1247  . insulin aspart (novoLOG) injection 0-5 Units  0-5 Units Subcutaneous QHS Harrie Foreman, MD      . iron polysaccharides (NIFEREX) capsule 150 mg  150 mg Oral BID Harrie Foreman, MD   150 mg at 03/30/16 2218  . isosorbide mononitrate (IMDUR) 24 hr tablet 60 mg  60 mg Oral BID Harrie Foreman, MD   60 mg at 03/30/16 2217  . latanoprost (XALATAN) 0.005 % ophthalmic solution 1 drop  1 drop Right Eye QHS Harrie Foreman, MD   1 drop at 03/30/16 2220  . lidocaine (LIDODERM) 5 % 1 patch  1 patch Transdermal Q24H Gladstone Lighter, MD   1 patch at 03/30/16 1006  . lisinopril (PRINIVIL,ZESTRIL) tablet 40 mg  40 mg Oral QHS Harrie Foreman, MD   40 mg at 03/30/16 2217  . methylPREDNISolone (MEDROL DOSEPAK) tablet 4 mg  4 mg Oral 4X daily taper Gladstone Lighter, MD   4 mg at 03/31/16 1248  . metoprolol succinate (TOPROL-XL) 24 hr tablet 75 mg  75 mg Oral Daily Harrie Foreman, MD   75 mg at 03/30/16 0936  . morphine 4 MG/ML injection 1-2 mg  1-2 mg Intravenous Q4H PRN Gladstone Lighter, MD   2 mg at 03/31/16 0018  . ondansetron (ZOFRAN) tablet 4 mg  4 mg Oral Q6H PRN Harrie Foreman, MD       Or  . ondansetron [facility]) injection 4 mg  4 mg Intravenous  Q6H PRN Harrie Foreman, MD      . pantoprazole (PROTONIX) EC tablet 40 mg  40 mg Oral BID Harrie Foreman, MD  40 mg at 03/30/16 2217  . pilocarpine (PILOCAR) 4 % ophthalmic solution 1 drop  1 drop Right Eye QID Harrie Foreman, MD   1 drop at 03/31/16 1248  . potassium chloride (K-DUR,KLOR-CON) CR tablet 10 mEq  10 mEq Oral Daily Harrie Foreman, MD   10 mEq at 03/30/16 5176  . pravastatin (PRAVACHOL) tablet 40 mg  40 mg Oral q1800 Harrie Foreman, MD   40 mg at 03/30/16 1743  . sodium chloride flush (NS) 0.9 % injection 3 mL  3 mL Intravenous Q12H Harrie Foreman, MD   3 mL at 03/30/16 2222  . sodium chloride flush 0.9 % injection           . traMADol (ULTRAM) tablet 50 mg  50 mg Oral Q6H PRN Gladstone Lighter, MD   50 mg at 03/30/16 1326  . trolamine salicylate (ASPERCREME) 10 % cream   Topical BID PRN Gladstone Lighter, MD   1 application at 16/07/37 0840     Discharge Medications: Please see discharge summary for a list of discharge medications.  Relevant Imaging Results:  Relevant Lab Results:   Additional Information ss: [id number]   Patient will require 2 weeks of IV ampicillin 2gm Q6 starting on 1/30  [facility], LCSW

## 2016-03-31 NOTE — Progress Notes (Signed)
tp tol procedure well. Alert and oriented. Report call To Rn. Aware of NPO status till about 1200 due to throat viscous lidocaine po given by dr. Rockey Situ.

## 2016-03-31 NOTE — Care Management (Signed)
Patient and family are considering skilled nursing placement. PICC has been inserted and patient will require approximately 2 weeks of Ampicillin q 6 hrs. If decide to discharge home, [facility] will provide home health and pharmacy services

## 2016-03-31 NOTE — Evaluation (Signed)
Physical Therapy Evaluation Patient Details Name: [name] MRN: [id number] DOB: 07-01-1924 Today's Date: 03/31/2016   History of Present Illness  Pt is an 83 year old female with multiple medical problems including severe stenosis, arthritis, depression, diabetes, hypertension and history of stroke presents to hospital secondary to worsening back pain on 1/29.  Pt found to have community acquired PNA, sepsis, and elevated troponin (likely d/t demand ischemia).  Spinal x-rays confirm no trauma/fractures but pt does have severe osteopenia and degenerative changes; cardiac consult confirmed no valvular endocarditis and moderate aortic valve stenosis - nonsurgical at this time.  Per case mgr, pt will require IV antibiotics long term.  Clinical Impression  Prior to hospital admission, pt was independent with functional mobility.  Pt lives alone in 1 level home with ramp to enter (but usually uses stairs).  Currently pt is CGA to min assist with bed mobility, min assist with transfers, and min assist with ambulation 30 feet with RW.  Pt's low back pain 7/10 during session.  Activity limited d/t pain and overall decreased activity tolerance/endurance.  Pt would benefit from skilled PT to address noted impairments and functional limitations.  Recommend pt discharge to STR when medically appropriate.    Follow Up Recommendations SNF    Equipment Recommendations  Rolling walker with 5" wheels    Recommendations for Other Services       Precautions / Restrictions Precautions Precautions: Fall Restrictions Weight Bearing Restrictions: No      Mobility  Bed Mobility Overal bed mobility: Needs Assistance Bed Mobility: Rolling;Supine to Sit;Sit to Supine Rolling: Supervision   Supine to sit: HOB elevated;Min guard Sit to supine: Min assist   General bed mobility comments: Increased time to perform activities; vc's for use of bed rails  Transfers Overall transfer level: Needs  assistance Equipment used: Rolling walker (2 wheeled) Transfers: Sit to/from Stand Sit to Stand: Min assist         General transfer comment: increased effort to perform; x2 attempts in order to stand and required cueing for hand and feet placement  Ambulation/Gait Ambulation/Gait assistance: Min assist Ambulation Distance (Feet): 30 Feet Assistive device: Rolling walker (2 wheeled)   Gait velocity: decreased   General Gait Details: decreased B step length/foot clearance/heelstrike  Stairs            Wheelchair Mobility    Modified Rankin (Stroke Patients Only)       Balance Overall balance assessment: Needs assistance Sitting-balance support: Bilateral upper extremity supported;Feet supported Sitting balance-Leahy Scale: Fair     Standing balance support: Bilateral upper extremity supported (on RW) Standing balance-Leahy Scale: Fair Standing balance comment: static standing                             Pertinent Vitals/Pain Pain Assessment: 0-10 Pain Score: 7  Pain Location: low back pain (great toe on both feet also sensitive) Pain Descriptors / Indicators: Sore;Aching Pain Intervention(s): Limited activity within patient's tolerance;Monitored during session;Premedicated before session;Repositioned  Vitals (HR and O2 on room air) stable and WFL throughout treatment session.    Home Living Family/patient expects to be discharged to:: Private residence Living Arrangements: Alone Available Help at Discharge: Family;Available PRN/intermittently Type of Home: House Home Access: Ramped entrance;Stairs to enter   Entrance Stairs-Number of Steps: 3 steps with L rail into home and 2 steps down (no rail) to pt's closet Home Layout: One level Home Equipment: Shower seat;Cane - single point;Walker - 4 wheels  Prior Function Level of Independence: Independent         Comments: Pt lives alone and was independent with sponge bathing outside of  the tub, dressing, toileting and functional mobility prior to hospitalization. Pt recieved assistance for her medication mgt, hair care, meals (family initially prepared, but recently start receiving meals on wheels assistance), and going out in community as pt has not driven in a while. Pt is "very independent natured" and enjoys attending church services, shopping, and listening to gospel music     Hand Dominance        Extremity/Trunk Assessment   Upper Extremity Assessment Upper Extremity Assessment: Defer to OT evaluation    Lower Extremity Assessment Lower Extremity Assessment: Generalized weakness       Communication   Communication: No difficulties  Cognition Arousal/Alertness: Awake/alert Behavior During Therapy: WFL for tasks assessed/performed Overall Cognitive Status: Impaired/Different from baseline Area of Impairment: Orientation Orientation Level: Disoriented to;Time             General Comments: Other than date/time, pt alert and oriented    General Comments General comments (skin integrity, edema, etc.): Pt's family present during session.  Pt agreeable to PT session although reports being fatigued from recent OT session.    Exercises Other Exercises Other Exercises: pt/dtr educated in home mods/AE for safety at home to max functional independence in home   Assessment/Plan    PT Assessment Patient needs continued PT services  PT Problem List Decreased strength;Decreased activity tolerance;Decreased balance;Decreased mobility;Pain          PT Treatment Interventions DME instruction;Gait training;Stair training;Functional mobility training;Therapeutic activities;Therapeutic exercise;Balance training;Patient/family education    PT Goals (Current goals can be found in the Care Plan section)  Acute Rehab PT Goals Patient Stated Goal: to regain independence PT Goal Formulation: With patient Time For Goal Achievement: 04/14/16 Potential to Achieve  Goals: Good    Frequency Min 2X/week   Barriers to discharge Decreased caregiver support      Co-evaluation               End of Session Equipment Utilized During Treatment: Gait belt Activity Tolerance: Patient limited by fatigue;Patient limited by pain Patient left: in bed;with call bell/phone within reach;with bed alarm set;with family/visitor present Nurse Communication: Mobility status;Precautions         Time: 6553-7482 PT Time Calculation (min) (ACUTE ONLY): 20 min   Charges:   PT Evaluation $PT Eval Low Complexity: 1 Procedure     PT G CodesLeitha Bleak 2016/04/28, 3:12 PM Leitha Bleak, [place]

## 2016-03-31 NOTE — Progress Notes (Addendum)
Inpatient Diabetes Program Recommendations  AACE/ADA: New Consensus Statement on Inpatient Glycemic Control (2015)  Target Ranges:  Prepandial:   less than 140 mg/dL      Peak postprandial:   less than 180 mg/dL (1-2 hours)      Critically ill patients:  140 - 180 mg/dL   Lab Results  Component Value Date   GLUCAP 199 (H) 03/31/2016   HGBA1C 6.8 (H) 03/28/2016    Review of Glycemic Control  Results for [name], [name] (MRN [id number]) as of 03/31/2016 11:30  Ref. Range 03/30/2016 07:29 03/30/2016 11:47 03/30/2016 17:15 03/30/2016 20:55 03/31/2016 07:50  Glucose-Capillary Latest Ref Range: 65 - 99 mg/dL 172 (H) 299 (H) 280 (H) 189 (H) 199 (H)    Diabetes history: Type 2 Outpatient Diabetes medications: Amaryl 1mg /day Current orders for Inpatient glycemic control: Novolog 0-15 units tid, Novolog 0-5 units qhs  Inpatient Diabetes Program Recommendations:   Agree with current medications for blood sugar management.    Gentry Fitz, RN, BA, MHA, CDE Diabetes Coordinator Inpatient Diabetes Program  [phone] (Team Pager) [phone] ([place]) 03/31/2016 11:31 AM

## 2016-03-31 NOTE — Progress Notes (Signed)
Pt stable waiting for Dr. Abbott Pao aware of procedure. IV site patent. No co at this time.

## 2016-03-31 NOTE — Progress Notes (Signed)
[name] at [place] NAME: [name]    MR#:  [id number]  DATE OF BIRTH:  15-Dec-1924  SUBJECTIVE:   - still has severe low back pain. But improved than yesterday as able to move in bed. Started on medrol dose pack. MRI spine with no osteomyelitis or fractures noted. - enterococcus bacteremia- on ampicillin. For TEE today- No fevers now.  REVIEW OF SYSTEMS:  Review of Systems  Constitutional: Positive for malaise/fatigue. Negative for chills and fever.  HENT: Negative for congestion, ear discharge, hearing loss and nosebleeds.   Eyes: Negative for blurred vision and double vision.  Respiratory: Positive for cough. Negative for shortness of breath and wheezing.   Cardiovascular: Negative for chest pain, palpitations and leg swelling.  Gastrointestinal: Negative for abdominal pain, constipation, diarrhea, nausea and vomiting.  Genitourinary: Negative for dysuria.  Musculoskeletal: Positive for back pain.  Neurological: Negative for dizziness, speech change, focal weakness, seizures and headaches.  Psychiatric/Behavioral: Negative for depression.    DRUG ALLERGIES:   Allergies  Allergen Reactions  . Metformin And Related     VITALS:  Blood pressure 134/61, pulse 97, temperature 98 F (36.7 C), temperature source Oral, resp. rate 15, height 5\' 1"  (1.549 m), weight 69.9 kg (154 lb), SpO2 95 %.  PHYSICAL EXAMINATION:  Physical Exam  GENERAL:  83 y.o.-year-old patient lying in the bed and appears to be in distress secondary to back pain  EYES: Pupils equal, round, reactive to light and accommodation. No scleral icterus. Extraocular muscles intact.  HEENT: Head atraumatic, normocephalic. Oropharynx and nasopharynx clear.  NECK:  Supple, no jugular venous distention. No thyroid enlargement, no tenderness.  LUNGS: Normal breath sounds bilaterally, no wheezing, rales,rhonchi or crepitation. No use of accessory muscles of respiration.  Decreased bibasilar breath sounds CARDIOVASCULAR: S1, S2 normal. No  rubs, or gallops. 3/6 systolic murmur is present ABDOMEN: Soft, nontender, nondistended. Bowel sounds present. No organomegaly or mass.  Complains of lower back pain, no radiation of pain down the legs and no neurological deficits noted But able to maneuver in bed today EXTREMITIES: No pedal edema, cyanosis, or clubbing.  NEUROLOGIC: Cranial nerves II through XII are intact. Muscle strength 5/5 in all extremities. Sensation intact. Gait not checked.  PSYCHIATRIC: The patient is alert and oriented x 3.  SKIN: No obvious rash, lesion, or ulcer.    LABORATORY PANEL:   CBC  Recent Labs Lab 03/30/16 0652  WBC 8.9  HGB 9.5*  HCT 28.1*  PLT 173   ------------------------------------------------------------------------------------------------------------------  Chemistries   Recent Labs Lab 03/27/16 2138  03/31/16 0404  NA 138  < > 141  K 3.5  < > 4.0  CL 102  < > 104  CO2 29  < > 31  GLUCOSE 90  < > 217*  BUN 17  < > 21*  CREATININE 0.95  < > 0.83  CALCIUM 8.6*  < > 9.2  AST 56*  --   --   ALT 20  --   --   ALKPHOS 40  --   --   BILITOT 0.8  --   --   < > = values in this interval not displayed. ------------------------------------------------------------------------------------------------------------------  Cardiac Enzymes  Recent Labs Lab 03/28/16 1654  TROPONINI 0.69*   ------------------------------------------------------------------------------------------------------------------  RADIOLOGY:  No results found.  EKG:   Orders placed or performed during the hospital encounter of 03/27/16  . EKG 12-Lead  . EKG 12-Lead  . ED EKG 12-Lead  .  ED EKG 12-Lead    ASSESSMENT AND PLAN:   83 year old female with multiple medical problems including severe stenosis, arthritis, depression, diabetes, hypertension and history of stroke presents to hospital secondary to worsening back pain.  #1  Acute low back pain- no trauma, thoracic and lumbar spine X rays negative for any fractures, has severe osteopenia and degenerative changes. -No neurological deficits noted on exam. Medrol dose pack ordered with some improvement - also MRI thoracic and lumbar spines ordered - negative for osteomyelitis esp with fevers on admission and back pain and positive blood cultures -Pain medications and muscle relaxants ordered along with Lidoderm patch. - monitor as slow improvement noted. Incidental T10 hemangioma noted, but her pain is much lower- since slowly improving, encourage PT and outpatient physiotherapy consult for steroid epidural injection  #2 elevated troponin-demand ischemia, also has severe aortic stenosis. -Appreciate cardiology input. Continue cardiac medications. Patient has severe aortic stenosis  #3 Enteroccocus bacteremia- unknown source at this time Appreciate ID input- started on IV ampicillin  Recent ECHO with no vegetations, MRI of spine with no osteomyelitis - TEE negative of endocarditis  -repeat BC from 03/31/16 negative---PICC line today  #4 hypertension-continue Lasix, oral hydralazine, Imdur, lisinopril and metoprolol.  #5 diastolic CHF-appears stable. Continue low-dose Lasix daily  #6 DVT prophylaxis-on Lovenox.  Physical therapy consult recommends Rehab CSW informed   All the records are reviewed and case discussed with Care Management/Social Workerr. Management plans discussed with the patient, family and they are in agreement.  CODE STATUS: Full Code  TOTAL TIME TAKING CARE OF THIS PATIENT: 30 minutes.   POSSIBLE D/C IN  1 DAYS, DEPENDING ON CLINICAL CONDITION.   Kaivon Livesey M.D on 03/31/2016 at 3:41 PM  Between 7am to 6pm - Pager - [phone]  After 6pm go to [email or web address] - password [email or web address] [facility]  Office  [phone]  CC: Primary care physician; [name], MD

## 2016-03-31 NOTE — Clinical Social Work Note (Signed)
Clinical Social Work Assessment  Patient Details  Name: [name] MRN: [id number] Date of Birth: December 25, 1924  Date of referral:  03/31/16               Reason for consult:  Facility Placement                Permission sought to share information with:    Permission granted to share information::     Name::        Agency::     Relationship::     Contact Information:     Housing/Transportation Living arrangements for the past 2 months:  Single Family Home Source of Information:  Adult Children Patient Interpreter Needed:  None Criminal Activity/Legal Involvement Pertinent to Current Situation/Hospitalization:  No - Comment as needed Significant Relationships:  Adult Children Lives with:  Adult Children Do you feel safe going back to the place where you live?  Yes Need for family participation in patient care:  Yes (Comment)  Care giving concerns:  Patient is requiring PT for strengthening and IV ABX post discharge.   Social Worker assessment / plan:  CSW visited patient in her room and she was sleeping soundly. One of patient's daughters was in patient's room at time of visit and she called her other sister, [name], [phone] who assists with making a lot of the decisions for her mother. CSW explained role and purpose of visit. The sisters are willing for CSW to send out patient's referral but are going to talk with each other to come up with a plan to possibly take her back home and provide 24/7 care between the daughters and have home health for IV ABX and therapy. CSW explained that someone in the family would need to be taught how to do the IV ABX and would also need to stay with her to assist in mobility and day to day needs. Patient's daughters are aware that patient will be ready for discharge tomorrow.   CSW has conducted a bed search and waiting on bed offers. [facility] is pending due to a history of depression. CSW has faxed patient's information for prior  authorization to [place]: [phone] for review.   Employment status:  Retired Nurse, adult PT Recommendations:  [place] / Referral to community resources:     Patient/Family's Response to care:  Patient's family expressed appreciation for CSW assistance.  Patient/Family's Understanding of and Emotional Response to Diagnosis, Current Treatment, and Prognosis:  CSW explained in detail the options for discharge and they verbalized understanding. The patient's family is very close and they are trying to rally together to provide as much support for their mother as possible.  Emotional Assessment Appearance:  Appears stated age Attitude/Demeanor/Rapport:   (patient was sleeping throughout assessment) Affect (typically observed):   (patient was sleeping throughout assessment) Orientation:  Oriented to Self, Oriented to Place, Oriented to Situation Alcohol / Substance use:  Not Applicable Psych involvement (Current and /or in the community):  No (Comment)  Discharge Needs  Concerns to be addressed:  Care Coordination Readmission within the last 30 days:  No Current discharge risk:  None Barriers to Discharge:  No Barriers Identified   Shela Leff, LCSW 03/31/2016, 5:02 PM

## 2016-04-01 ENCOUNTER — Encounter: Payer: Self-pay | Admitting: Cardiovascular Disease

## 2016-04-01 ENCOUNTER — Institutional Professional Consult (permissible substitution): Payer: Self-pay | Admitting: Cardiovascular Disease

## 2016-04-01 LAB — GLUCOSE, CAPILLARY
Glucose-Capillary: 137 mg/dL — ABNORMAL HIGH (ref 65–99)
Glucose-Capillary: 191 mg/dL — ABNORMAL HIGH (ref 65–99)
Glucose-Capillary: 307 mg/dL — ABNORMAL HIGH (ref 65–99)

## 2016-04-01 LAB — OCCULT BLOOD X 1 CARD TO LAB, STOOL: Fecal Occult Bld: NEGATIVE

## 2016-04-01 MED ORDER — ENSURE ENLIVE PO LIQD: 237.0000 mL | Freq: Two times a day (BID) | ORAL | 12 refills | Status: DC

## 2016-04-01 MED ORDER — TRAMADOL HCL 50 MG PO TABS: 50.0000 mg | ORAL_TABLET | Freq: Four times a day (QID) | ORAL | 0 refills | Status: DC | PRN

## 2016-04-01 MED ORDER — SODIUM CHLORIDE 0.9 % IV SOLN: 2.0000 g | Freq: Four times a day (QID) | INTRAVENOUS | 0 refills | Status: AC

## 2016-04-01 MED ORDER — LIDOCAINE 5 % EX PTCH: 1.0000 | MEDICATED_PATCH | CUTANEOUS | 0 refills | Status: DC

## 2016-04-01 NOTE — Care Management (Signed)
Daughter informed CM that family was informed that hospital bed will be delivered 2.3. [name] - liaison was unable of this.  Family and patient wants to discharge today. There will be a place for patient to sleep.  There is no need for any additional equipment or oxygen.  [facility] will initiate the home IV therapy.  Updated primary nurse to call ems.

## 2016-04-01 NOTE — Care Management Important Message (Signed)
Important Message  Patient Details  Name: [name] MRN: [id number] Date of Birth: 1924-03-07   Medicare Important Message Given:  Yes  Initial signed IM printed from Epic and given to patient.     Katrina Stack, RN 04/01/2016, 2:10 PM

## 2016-04-01 NOTE — Progress Notes (Signed)
IV removed from patient, PICC line still in place. Discharge instructions given to patient and family member at the bedside along with hard copy prescriptions. EMS called for transportation. Patient currently awaiting transportation.

## 2016-04-01 NOTE — Progress Notes (Signed)
[facility] PROGRESS NOTE Date of Admission:  03/27/2016     ID: [name] is a 83 y.o. female with enterococcal bacteremia Active Problems:   CAP (community acquired pneumonia)   Viral upper respiratory tract infection   Acute back pain   Sepsis ([place])   Elevated troponin   Enterococcal bacteremia   Subjective: No fevers, TEE neg.   ROS  Eleven systems are reviewed and negative except per hpi  Medications:  Antibiotics Given (last 72 hours)    Date/Time Action Medication Dose Rate   03/29/16 2107 Given   ampicillin (OMNIPEN) 2 g in sodium chloride 0.9 % 50 mL IVPB 2 g 150 mL/hr   03/30/16 0039 Given   ampicillin (OMNIPEN) 2 g in sodium chloride 0.9 % 50 mL IVPB 2 g 150 mL/hr   03/30/16 0814 Given   ampicillin (OMNIPEN) 2 g in sodium chloride 0.9 % 50 mL IVPB 2 g 150 mL/hr   03/30/16 1245 Given   ampicillin (OMNIPEN) 2 g in sodium chloride 0.9 % 50 mL IVPB 2 g 150 mL/hr   03/30/16 1743 Given   cefTRIAXone (ROCEPHIN) IVPB 2 g 2 g 100 mL/hr   03/30/16 1816 Given   ampicillin (OMNIPEN) 2 g in sodium chloride 0.9 % 50 mL IVPB 2 g 150 mL/hr   03/31/16 0005 Given   ampicillin (OMNIPEN) 2 g in sodium chloride 0.9 % 50 mL IVPB 2 g 150 mL/hr   03/31/16 0407 Given   cefTRIAXone (ROCEPHIN) IVPB 2 g 2 g 100 mL/hr   03/31/16 0523 Given   ampicillin (OMNIPEN) 2 g in sodium chloride 0.9 % 50 mL IVPB 2 g 150 mL/hr   03/31/16 1247 Given   ampicillin (OMNIPEN) 2 g in sodium chloride 0.9 % 50 mL IVPB 2 g 150 mL/hr   03/31/16 1750 Given   ampicillin (OMNIPEN) 2 g in sodium chloride 0.9 % 50 mL IVPB 2 g 150 mL/hr   03/31/16 2312 Given   ampicillin (OMNIPEN) 2 g in sodium chloride 0.9 % 50 mL IVPB 2 g 150 mL/hr   04/01/16 0539 Given   ampicillin (OMNIPEN) 2 g in sodium chloride 0.9 % 50 mL IVPB 2 g 150 mL/hr   04/01/16 1200 Given   ampicillin (OMNIPEN) 2 g in sodium chloride 0.9 % 50 mL IVPB 2 g 150 mL/hr     . allopurinol  300 mg Oral Daily  . ampicillin  (OMNIPEN) IV  2 g Intravenous Q6H  . aspirin  81 mg Oral Daily  . brimonidine  1 drop Right Eye BID  . cholecalciferol  2,000 Units Oral Daily  . docusate sodium  100 mg Oral BID  . dorzolamide  1 drop Right Eye BID   And  . timolol  1 drop Right Eye BID  . doxazosin  4 mg Oral BID  . enoxaparin (LOVENOX) injection  40 mg Subcutaneous Q24H  . feeding supplement (ENSURE ENLIVE)  237 mL Oral BID BM  . furosemide  20 mg Oral Daily  . gabapentin  300 mg Oral BID  . hydrALAZINE  25 mg Oral TID  . insulin aspart  0-15 Units Subcutaneous TID WC  . insulin aspart  0-5 Units Subcutaneous QHS  . iron polysaccharides  150 mg Oral BID  . isosorbide mononitrate  60 mg Oral BID  . latanoprost  1 drop Right Eye QHS  . lidocaine  1 patch Transdermal Q24H  . lisinopril  40 mg Oral QHS  . methylPREDNISolone  4 mg Oral 4X daily taper  . metoprolol succinate  75 mg Oral Daily  . pantoprazole  40 mg Oral BID  . pilocarpine  1 drop Right Eye QID  . potassium chloride  10 mEq Oral Daily  . pravastatin  40 mg Oral q1800  . sodium chloride flush  3 mL Intravenous Q12H    Objective: Vital signs in last 24 hours: Temp:  [98 F (36.7 C)-98.4 F (36.9 C)] 98.3 F (36.8 C) (02/02 1115) Pulse Rate:  [75-87] 84 (02/02 1228) Resp:  [16-18] 18 (02/02 1115) BP: (115-156)/(53-63) 115/53 (02/02 1115) SpO2:  [97 %-100 %] 97 % (02/02 1228) Weight:  [70.3 kg (154 lb 14.4 oz)] 70.3 kg (154 lb 14.4 oz) (02/02 0438) Physical Exam  Constitutional:  Frail,  HENT: /AT, PERRLA, no scleral icterus Mouth/Throat: Oropharynx is clear and moist. No oropharyngeal exudate.  Cardiovascular: Normal rate, regular rhythm and normal heart sounds. 3/6 sm Pulmonary/Chest: Effort normal and breath sounds normal. No respiratory distress.  has no wheezes.  Neck = supple, no nuchal rigidity Abdominal: Soft. Bowel sounds are normal.  exhibits no distension. There is no tenderness.  Lymphadenopathy: no cervical adenopathy. No  axillary adenopathy Neurological: alert and oriented to person, place, and time.  Skin: Skin is warm and dry. No rash noted. No erythema.  Psychiatric: a normal mood and affect.  behavior is normal.    Lab Results  Recent Labs  03/30/16 0652 03/31/16 0404  WBC 8.9  --   HGB 9.5*  --   HCT 28.1*  --   NA 140 141  K 3.9 4.0  CL 104 104  CO2 27 31  BUN 15 21*  CREATININE 0.79 0.83    Microbiology: Results for orders placed or performed during the hospital encounter of 03/27/16  Urine culture     Status: Abnormal   Collection Time: 03/27/16  9:38 PM  Result Value Ref Range Status   Specimen Description URINE, RANDOM  Final   Special Requests NONE  Final   Culture MULTIPLE SPECIES PRESENT, SUGGEST RECOLLECTION (A)  Final   Report Status 03/29/2016 FINAL  Final  Blood Culture (routine x 2)     Status: Abnormal   Collection Time: 03/27/16  9:39 PM  Result Value Ref Range Status   Specimen Description BLOOD RIGHT ANTECUBITAL  Final   Special Requests   Final    BOTTLES DRAWN AEROBIC AND ANAEROBIC AER 10CC, ANA 8CC   Culture  Setup Time   Final    GRAM POSITIVE COCCI IN BOTH AEROBIC AND ANAEROBIC BOTTLES CRITICAL VALUE NOTED.  VALUE IS CONSISTENT WITH PREVIOUSLY REPORTED AND CALLED VALUE.    Culture ENTEROCOCCUS FAECALIS (A)  Final   Report Status 03/31/2016 FINAL  Final   Organism ID, Bacteria ENTEROCOCCUS FAECALIS  Final      Susceptibility   Enterococcus faecalis - MIC*    AMPICILLIN <=2 SENSITIVE Sensitive     VANCOMYCIN 1 SENSITIVE Sensitive     GENTAMICIN SYNERGY SENSITIVE Sensitive     * ENTEROCOCCUS FAECALIS  Blood Culture (routine x 2)     Status: None   Collection Time: 03/27/16  9:45 PM  Result Value Ref Range Status   Specimen Description BLOOD  Final   Special Requests   Final    TEST REQUEST RECEIVED WITHOUT APPROPRIATE SPECIMEN  RECEIVED IN ERROR   Culture TEST CANCELED RECEIVED IN ERROR  Final   Report Status 03/28/2016 FINAL  Final  Blood Culture  (routine x 2)  Status: Abnormal   Collection Time: 03/27/16 11:10 PM  Result Value Ref Range Status   Specimen Description BLOOD RIGHT ANTECUBITAL  Final   Special Requests   Final    BOTTLES DRAWN AEROBIC AND ANAEROBIC AER 11CC,ANA Catherine   Culture  Setup Time   Final    GRAM POSITIVE COCCI IN BOTH AEROBIC AND ANAEROBIC BOTTLES CRITICAL RESULT CALLED TO, READ BACK BY AND VERIFIED WITH: JASON ROBBINS 03/28/16 @ 1933  Aneta    Culture (A)  Final    ENTEROCOCCUS FAECALIS SUSCEPTIBILITIES PERFORMED ON PREVIOUS CULTURE WITHIN THE LAST 5 DAYS. Performed at [facility], [place] [address], [place], [place] [address]    Report Status 03/31/2016 FINAL  Final  Blood Culture ID Panel (Reflexed)     Status: Abnormal   Collection Time: 03/27/16 11:10 PM  Result Value Ref Range Status   Enterococcus species DETECTED (A) NOT DETECTED Final    Comment: CRITICAL RESULT CALLED TO, READ BACK BY AND VERIFIED WITH: JASON ROBBINS 03/28/16 @ 1933 ML[facility]    Vancomycin resistance NOT DETECTED NOT DETECTED Final   Listeria monocytogenes NOT DETECTED NOT DETECTED Final   Staphylococcus species NOT DETECTED NOT DETECTED Final   Staphylococcus aureus NOT DETECTED NOT DETECTED Final   Streptococcus species NOT DETECTED NOT DETECTED Final   Streptococcus agalactiae NOT DETECTED NOT DETECTED Final   Streptococcus pneumoniae NOT DETECTED NOT DETECTED Final   Streptococcus pyogenes NOT DETECTED NOT DETECTED Final   Acinetobacter baumannii NOT DETECTED NOT DETECTED Final   Enterobacteriaceae species NOT DETECTED NOT DETECTED Final   Enterobacter cloacae complex NOT DETECTED NOT DETECTED Final   Escherichia coli NOT DETECTED NOT DETECTED Final   Klebsiella oxytoca NOT DETECTED NOT DETECTED Final   Klebsiella pneumoniae NOT DETECTED NOT DETECTED Final   Proteus species NOT DETECTED NOT DETECTED Final   Serratia marcescens NOT DETECTED NOT DETECTED Final   Haemophilus influenzae NOT DETECTED NOT DETECTED Final    Neisseria meningitidis NOT DETECTED NOT DETECTED Final   Pseudomonas aeruginosa NOT DETECTED NOT DETECTED Final   Candida albicans NOT DETECTED NOT DETECTED Final   Candida glabrata NOT DETECTED NOT DETECTED Final   Candida krusei NOT DETECTED NOT DETECTED Final   Candida parapsilosis NOT DETECTED NOT DETECTED Final   Candida tropicalis NOT DETECTED NOT DETECTED Final  MRSA PCR Screening     Status: None   Collection Time: 03/28/16  7:10 AM  Result Value Ref Range Status   MRSA by PCR NEGATIVE NEGATIVE Final    Comment:        The GeneXpert MRSA Assay (FDA approved for NASAL specimens only), is one component of a comprehensive MRSA colonization surveillance program. It is not intended to diagnose MRSA infection nor to guide or monitor treatment for MRSA infections.   Culture, blood (single) w Reflex to ID Panel     Status: None (Preliminary result)   Collection Time: 03/29/16 12:28 PM  Result Value Ref Range Status   Specimen Description BLOOD RIGHT HAND  Final   Special Requests   Final    BOTTLES DRAWN AEROBIC AND ANAEROBIC AER 15ML ANA 22ML   Culture NO GROWTH 3 DAYS  Final   Report Status PENDING  Incomplete    Studies/Results: No results found.  Assessment/Plan: [name] is a 83 y.o. female with enterococcal bacteremia 2/2 and acute worsening of chronic back pain. She has severe AS and was being considered for TAVR.  MRI negative of spine. TEE negative. FU BCX neg  1/30. Will give 2 week course from first negative BCX  Recommendations Cont ampicillin IV for a 2 weeks from 1/30 - see abx order sheet I can see in fu at end of treatment  Thank you very much for the consult. Will follow with you.  Devaunte Gasparini P   04/01/2016, 2:55 PM

## 2016-04-01 NOTE — Progress Notes (Signed)
SATURATION QUALIFICATIONS: (This note is used to comply with regulatory documentation for home oxygen)  Patient Saturations on Room Air at Rest = 98%  Patient Saturations on Room Air with exertion = 95%  Patient Saturations on 0 Liters of oxygen while Ambulating = 0% pt unable to ambulate   Please briefly explain why patient needs home oxygen:

## 2016-04-01 NOTE — Progress Notes (Signed)
Patient was picked up by 2 EMS personnel and accompanied by her daughter. No acute distress noted. Tele box was removed.

## 2016-04-01 NOTE — Discharge Summary (Signed)
[name] at [name]: [name]    MR#:  [id number]  DATE OF BIRTH:  [birth year]  DATE OF ADMISSION:  03/27/2016 ADMITTING PHYSICIAN: Harrie Foreman, MD  DATE OF DISCHARGE: 04/01/16  PRIMARY CARE PHYSICIAN: [name], MD    ADMISSION DIAGNOSIS:  Elevated troponin [R74.8] Sepsis, due to unspecified organism [name]) [A41.9] Acute right-sided low back pain without sciatica [M54.5]  DISCHARGE DIAGNOSIS:  Enterococcus Sepsis Moderate AS Back pain  SECONDARY DIAGNOSIS:   Past Medical History:  Diagnosis Date  . Anemia   . Bone disease   . Carotid arterial disease ([name])    a. 08/2014 U/S: Bilateral < 50% stneosis. Patent vertebrals w/ antegrade flow.   . Cervical spine arthritis ([name])   . Chronic diastolic CHF (congestive heart failure) ([name])    a. echo 2014: EF 55-60%, DD;  b. 08/2014 Echo: EF 55-60%, no RWMA, GR1DD; c. 02/2016 Echo: EF 65-70%, Gr1 DD.  Marland Kitchen Cognitive impairment   . Depression   . Diabetes mellitus without complication ([name])   . Essential hypertension   . GERD (gastroesophageal reflux disease)   . Glaucoma   . Gout   . Hiatal hernia   . History of renal impairment   . Hyperlipidemia   . Hypotension    a. Related to Norvasc - 11/2014 ED visit.  . Multinodular goiter    a. Noted incidentally on CT 07/2012 and carotid U/S 08/2014;  b. Nl TSH 11/2014.  Marland Kitchen Paroxysmal SVT (supraventricular tachycardia) (HCC)    a. on Toprol   . Prolapsed uterus   . Severe aortic stenosis    a. echo 08/2014: EF 55-60%, no RWMA, GR1DD, severe AS, mild AI, Mean gradient (S): 38 mm Hg. Valve area (VTI): 0.78 cm^2., mild-mod MR, LA mildly dilated, atrial septal aneurysm, mild TR;  b. 02/2016 Echo: EF 65-70%, Gr1 DD, sev AS, mild to mod AI, Valve area (VTI): 0.94 cm^2, (Vmax) 0.96 cm^2, (Vmean) 0.93cm^2, mild MR, mildly dil LA, nl RV fxn, PASP 46mmHg.  . Stroke [name])    a. CT head 07/2014 showed small thalamic infarct  .  Vitamin deficiency     HOSPITAL COURSE:  83 year old female with multiple medical problems including severe stenosis, arthritis, depression, diabetes, hypertension and history of stroke presents to hospital secondary to worsening back pain.  #1 Acute low back pain- no trauma, thoracic and lumbar spine X rays negative for any fractures, has severe osteopenia and degenerative changes. -No neurological deficits noted on exam. Medrol dose pack ordered with some improvement - also MRI thoracic and lumbar spines ordered - negative for osteomyelitis esp with fevers on admission and back pain and positive blood cultures -Pain medications and muscle relaxants ordered along with Lidoderm patch. - monitor as slow improvement noted. Incidental T10 hemangioma noted, but her pain is much lower- since slowly improving, encourage PT  #2 elevated troponin-demand ischemia, also has moderate aortic stenosis. -Appreciate cardiology input. Continue cardiac medications. Patient has moderate aortic stenosis  #3 Enteroccocus bacteremia- unknown source at this time Appreciate ID input- started on IV ampicillin  Recent ECHO with no vegetations, MRI of spine with no osteomyelitis - TEE negative of endocarditis  -repeat BC from 03/31/16 negative---PICC line placed on Feb 1st -IV ampicillin per ID till 04/11/16  #4 hypertension-continue Lasix, oral hydralazine, Imdur, lisinopril and metoprolol.  #5 diastolic CHF-appears stable. Continue low-dose Lasix daily  #6 DVT prophylaxis-on Lovenox.  Physical therapy consult recommends Rehab CSW informed. Family deciding  on rehab vs Home  pt overall stable and ready for d/c  CONSULTS OBTAINED:  Treatment Team:  Wellington Hampshire, MD Leonel Ramsay, MD  DRUG ALLERGIES:   Allergies  Allergen Reactions  . Metformin And Related     DISCHARGE MEDICATIONS:   Current Discharge Medication List    START taking these medications   Details  ampicillin 2 g  in sodium chloride 0.9 % 50 mL Inject 2 g into the vein every 6 (six) hours. Qty: 40 ampule, Refills: 0    feeding supplement, ENSURE ENLIVE, (ENSURE ENLIVE) LIQD Take 237 mLs by mouth 2 (two) times daily between meals. Qty: 237 mL, Refills: 12    lidocaine (LIDODERM) 5 % Place 1 patch onto the skin daily. Remove & Discard patch within 12 hours or as directed by MD Qty: 15 patch, Refills: 0    traMADol (ULTRAM) 50 MG tablet Take 1 tablet (50 mg total) by mouth every 6 (six) hours as needed for moderate pain. Qty: 30 tablet, Refills: 0      CONTINUE these medications which have NOT CHANGED   Details  allopurinol (ZYLOPRIM) 300 MG tablet Take 300 mg by mouth daily.    aspirin 81 MG tablet Take 81 mg by mouth daily.    benzonatate (TESSALON) 100 MG capsule Take 200 mg by mouth 3 (three) times daily as needed for cough.    brimonidine (ALPHAGAN P) 0.1 % SOLN Place 1 drop into the right eye 2 (two) times daily.    cholecalciferol (VITAMIN D) 1000 UNITS tablet Take 2,000 Units by mouth daily.     Dorzolamide HCl-Timolol Mal (COSOPT OP) Place 1 drop into the right eye 2 (two) times daily.     doxazosin (CARDURA) 4 MG tablet Take 1 tablet (4 mg total) by mouth 2 (two) times daily. Qty: 60 tablet, Refills: 11    furosemide (LASIX) 20 MG tablet Take 1 tablet (20 mg total) by mouth daily. Qty: 30 tablet, Refills: 6    gabapentin (NEURONTIN) 300 MG capsule Take 300 mg by mouth 2 (two) times daily.    glimepiride (AMARYL) 1 MG tablet Take 1 mg by mouth daily with breakfast.    hydrALAZINE (APRESOLINE) 50 MG tablet Take 1 tablet (50 mg total) by mouth 3 (three) times daily. Qty: 270 tablet, Refills: 3    iron polysaccharides (NIFEREX) 150 MG capsule Take 150 mg by mouth 2 (two) times daily.    isosorbide mononitrate (IMDUR) 60 MG 24 hr tablet Take 1 tablet (60 mg total) by mouth 2 (two) times daily. Qty: 60 tablet, Refills: 6    latanoprost (XALATAN) 0.005 % ophthalmic solution Place 1  drop into the right eye at bedtime. Refills: 3    lisinopril (PRINIVIL,ZESTRIL) 40 MG tablet Take 40 mg by mouth at bedtime.    lovastatin (MEVACOR) 40 MG tablet Take 40 mg by mouth at bedtime.    metoprolol succinate (TOPROL-XL) 25 MG 24 hr tablet Take 3 tablets (75 mg total) by mouth daily. Take with or immediately following a meal. Qty: 120 tablet, Refills: 0    pantoprazole (PROTONIX) 40 MG tablet Take 40 mg by mouth 2 (two) times daily.    pilocarpine (PILOCAR) 4 % ophthalmic solution Place 1 drop into the right eye 4 (four) times daily.     potassium chloride (K-DUR) 10 MEQ tablet Take 1 tablet (10 mEq total) by mouth daily. Take with lasix Qty: 30 tablet, Refills: 6    tiZANidine (ZANAFLEX) 4 MG tablet  Take 1 tablet by mouth 3 (three) times daily as needed. Refills: 1        If you experience worsening of your admission symptoms, develop shortness of breath, life threatening emergency, suicidal or homicidal thoughts you must seek medical attention immediately by calling 911 or calling your MD immediately  if symptoms less severe.  You Must read complete instructions/literature along with all the possible adverse reactions/side effects for all the Medicines you take and that have been prescribed to you. Take any new Medicines after you have completely understood and accept all the possible adverse reactions/side effects.   Please note  You were cared for by a hospitalist during your hospital stay. If you have any questions about your discharge medications or the care you received while you were in the hospital after you are discharged, you can call the unit and asked to speak with the hospitalist on call if the hospitalist that took care of you is not available. Once you are discharged, your primary care physician will handle any further medical issues. Please note that NO REFILLS for any discharge medications will be authorized once you are discharged, as it is imperative that you  return to your primary care physician (or establish a relationship with a primary care physician if you do not have one) for your aftercare needs so that they can reassess your need for medications and monitor your lab values. Today   SUBJECTIVE   Doing well dter at bedside VITAL SIGNS:  Blood pressure (!) 148/63, pulse 77, temperature 98.2 F (36.8 C), temperature source Oral, resp. rate 16, height 5\' 1"  (1.549 m), weight 70.3 kg (154 lb 14.4 oz), SpO2 100 %.  I/O:   Intake/Output Summary (Last 24 hours) at 04/01/16 0839 Last data filed at 04/01/16 0600  Gross per 24 hour  Intake                3 ml  Output              300 ml  Net             -297 ml    PHYSICAL EXAMINATION:  GENERAL:  83 y.o.-year-old patient lying in the bed with no acute distress.  EYES: Pupils equal, round, reactive to light and accommodation. No scleral icterus. Extraocular muscles intact.  HEENT: Head atraumatic, normocephalic. Oropharynx and nasopharynx clear.  NECK:  Supple, no jugular venous distention. No thyroid enlargement, no tenderness.  LUNGS: Normal breath sounds bilaterally, no wheezing, rales,rhonchi or crepitation. No use of accessory muscles of respiration.  CARDIOVASCULAR: S1, S2 normal. No murmurs, rubs, or gallops.  ABDOMEN: Soft, non-tender, non-distended. Bowel sounds present. No organomegaly or mass.  EXTREMITIES: No pedal edema, cyanosis, or clubbing. PICC + NEUROLOGIC: Cranial nerves II through XII are intact. Muscle strength 5/5 in all extremities. Sensation intact. Gait not checked.  PSYCHIATRIC: The patient is alert and oriented x 3.  SKIN: No obvious rash, lesion, or ulcer.   DATA REVIEW:   CBC   Recent Labs Lab 03/30/16 0652  WBC 8.9  HGB 9.5*  HCT 28.1*  PLT 173    Chemistries   Recent Labs Lab 03/27/16 2138  03/31/16 0404  NA 138  < > 141  K 3.5  < > 4.0  CL 102  < > 104  CO2 29  < > 31  GLUCOSE 90  < > 217*  BUN 17  < > 21*  CREATININE 0.95  < > 0.83   CALCIUM  8.6*  < > 9.2  AST 56*  --   --   ALT 20  --   --   ALKPHOS 40  --   --   BILITOT 0.8  --   --   < > = values in this interval not displayed.  Microbiology Results   Recent Results (from the past 240 hour(s))  Urine culture     Status: Abnormal   Collection Time: 03/27/16  9:38 PM  Result Value Ref Range Status   Specimen Description URINE, RANDOM  Final   Special Requests NONE  Final   Culture MULTIPLE SPECIES PRESENT, SUGGEST RECOLLECTION (A)  Final   Report Status 03/29/2016 FINAL  Final  Blood Culture (routine x 2)     Status: Abnormal   Collection Time: 03/27/16  9:39 PM  Result Value Ref Range Status   Specimen Description BLOOD RIGHT ANTECUBITAL  Final   Special Requests   Final    BOTTLES DRAWN AEROBIC AND ANAEROBIC AER 10CC, ANA 8CC   Culture  Setup Time   Final    GRAM POSITIVE COCCI IN BOTH AEROBIC AND ANAEROBIC BOTTLES CRITICAL VALUE NOTED.  VALUE IS CONSISTENT WITH PREVIOUSLY REPORTED AND CALLED VALUE.    Culture ENTEROCOCCUS FAECALIS (A)  Final   Report Status 03/31/2016 FINAL  Final   Organism ID, Bacteria ENTEROCOCCUS FAECALIS  Final      Susceptibility   Enterococcus faecalis - MIC*    AMPICILLIN <=2 SENSITIVE Sensitive     VANCOMYCIN 1 SENSITIVE Sensitive     GENTAMICIN SYNERGY SENSITIVE Sensitive     * ENTEROCOCCUS FAECALIS  Blood Culture (routine x 2)     Status: None   Collection Time: 03/27/16  9:45 PM  Result Value Ref Range Status   Specimen Description BLOOD  Final   Special Requests   Final    TEST REQUEST RECEIVED WITHOUT APPROPRIATE SPECIMEN  RECEIVED IN ERROR   Culture TEST CANCELED RECEIVED IN ERROR  Final   Report Status 03/28/2016 FINAL  Final  Blood Culture (routine x 2)     Status: Abnormal   Collection Time: 03/27/16 11:10 PM  Result Value Ref Range Status   Specimen Description BLOOD RIGHT ANTECUBITAL  Final   Special Requests   Final    BOTTLES DRAWN AEROBIC AND ANAEROBIC AER 11CC,ANA [place]   Culture  Setup Time   Final     GRAM POSITIVE COCCI IN BOTH AEROBIC AND ANAEROBIC BOTTLES CRITICAL RESULT CALLED TO, READ BACK BY AND VERIFIED WITH: JASON ROBBINS 03/28/16 @ 1933  [place]    Culture (A)  Final    ENTEROCOCCUS FAECALIS SUSCEPTIBILITIES PERFORMED ON PREVIOUS CULTURE WITHIN THE LAST 5 DAYS. Performed at [facility], [place] [address], [place],  [address]    Report Status 03/31/2016 FINAL  Final  Blood Culture ID Panel (Reflexed)     Status: Abnormal   Collection Time: 03/27/16 11:10 PM  Result Value Ref Range Status   Enterococcus species DETECTED (A) NOT DETECTED Final    Comment: CRITICAL RESULT CALLED TO, READ BACK BY AND VERIFIED WITH: JASON ROBBINS 03/28/16 @ 1933 [facility]    Vancomycin resistance NOT DETECTED NOT DETECTED Final   Listeria monocytogenes NOT DETECTED NOT DETECTED Final   Staphylococcus species NOT DETECTED NOT DETECTED Final   Staphylococcus aureus NOT DETECTED NOT DETECTED Final   Streptococcus species NOT DETECTED NOT DETECTED Final   Streptococcus agalactiae NOT DETECTED NOT DETECTED Final   Streptococcus pneumoniae NOT DETECTED NOT DETECTED Final  Streptococcus pyogenes NOT DETECTED NOT DETECTED Final   Acinetobacter baumannii NOT DETECTED NOT DETECTED Final   Enterobacteriaceae species NOT DETECTED NOT DETECTED Final   Enterobacter cloacae complex NOT DETECTED NOT DETECTED Final   Escherichia coli NOT DETECTED NOT DETECTED Final   Klebsiella oxytoca NOT DETECTED NOT DETECTED Final   Klebsiella pneumoniae NOT DETECTED NOT DETECTED Final   Proteus species NOT DETECTED NOT DETECTED Final   Serratia marcescens NOT DETECTED NOT DETECTED Final   Haemophilus influenzae NOT DETECTED NOT DETECTED Final   Neisseria meningitidis NOT DETECTED NOT DETECTED Final   Pseudomonas aeruginosa NOT DETECTED NOT DETECTED Final   Candida albicans NOT DETECTED NOT DETECTED Final   Candida glabrata NOT DETECTED NOT DETECTED Final   Candida krusei NOT DETECTED NOT DETECTED Final   Candida  parapsilosis NOT DETECTED NOT DETECTED Final   Candida tropicalis NOT DETECTED NOT DETECTED Final  MRSA PCR Screening     Status: None   Collection Time: 03/28/16  7:10 AM  Result Value Ref Range Status   MRSA by PCR NEGATIVE NEGATIVE Final    Comment:        The GeneXpert MRSA Assay (FDA approved for NASAL specimens only), is one component of a comprehensive MRSA colonization surveillance program. It is not intended to diagnose MRSA infection nor to guide or monitor treatment for MRSA infections.   Culture, blood (single) w Reflex to ID Panel     Status: None (Preliminary result)   Collection Time: 03/29/16 12:28 PM  Result Value Ref Range Status   Specimen Description BLOOD RIGHT HAND  Final   Special Requests   Final    BOTTLES DRAWN AEROBIC AND ANAEROBIC AER 15ML ANA 22ML   Culture NO GROWTH 3 DAYS  Final   Report Status PENDING  Incomplete    RADIOLOGY:  No results found.   Management plans discussed with the patient, family and they are in agreement.  CODE STATUS:     Code Status Orders        Start     Ordered   03/28/16 0651  Full code  Continuous     03/28/16 0650    Code Status History    Date Active Date Inactive Code Status Order ID Comments User Context   09/10/2014  4:35 PM 09/12/2014  5:15 PM Full Code [id number]  Aldean Jewett, MD Inpatient   08/28/2014  9:45 AM 08/29/2014  3:01 PM Full Code [id number]  Bettey Costa, MD Inpatient    Advance Directive Documentation   Flowsheet Row Most Recent Value  Type of Advance Directive  Healthcare Power of Attorney  Pre-existing out of facility DNR order (yellow form or pink MOST form)  No data  "MOST" Form in Place?  No data      TOTAL TIME TAKING CARE OF THIS PATIENT: 40 minutes.    [place] Giammona M.D on 04/01/2016 at 8:39 AM  Between 7am to 6pm - Pager - [phone] After 6pm go to [email or web address] - password [email or web address] [facility]  Office  [phone]  CC: Primary care physician;  [name], MD

## 2016-04-01 NOTE — Progress Notes (Signed)
Miranda RN from  [employer] called stated she was coming to bring their pump to administer ampicillin IV for the pt.  since it was scheduled at 6 pm for them. The RN indicated that's not going to happen because the pt. Is still under her care.  [place] nurse indicated they've been going to main [facility] to administer medications like that. The primary nurse consulted the charge nurse and the supervisor and both did not have any clue about what she was talking about about.  The [facility] nurse was told an IV ampicillin is scheduled at 2200 and will be given if the pt. is still in  the hospital.

## 2016-04-01 NOTE — Care Management (Signed)
[facility] BED  Patient suffers from severe aortic stenosis  and severe back pain and has trouble breathing at nigh and has increased paint when head is elevated less than 30  degrees. Bed wedges do not provide enough elevation to resolve breathing or pain issues. Shortness of breath and pain cause patient to require frequent changes in body position which can not be achieved with a normal bed.   Family had decided to discharge home with home health.  Requested script for hospital bed SN PT OT and Aide.  Corene Cornea with [facility] is working on this referral

## 2016-04-01 NOTE — Progress Notes (Addendum)
LCSW following for disposition.  Plan for family... SNF. Referrals for [facility] and [facility].  Referrals reviewed: [facility]: declined due to cost of IV antibiotic. Discussed with MD changes in IV antibiotic in effort to lower cost, however unable to do so. [facility]: declined, no female bed available at this time.  Patient has other bed offers that can be reviewed with family. LCSW had long discussion with patient daughter and another daughter Renea on the phone.  Gerilyn Nestle in the room and discussed barriers with 2 SNFs requested.  LCSW presented other options for SNF in the [place] area in which they declined. Discussed extending search in [place] or [place], but family has decided to take patient home.  Family reports they are well versed with care as 2 sisters own group homes.  Discussed training with IV antibiotics and equipment, in which LCSW referred to CM who has been working with [place].  Family requests EMS for transport home if patient able to qualify. All other questions answered and no other needs voiced by the family.  LCSW will sign off at this time. Plan: DC home with home health.  Lane Hacker, MSW Clinical Social Work: [employer] Coverage for :  [phone]

## 2016-04-01 NOTE — Care Management (Signed)
Patient does not qualify for home 02.  She will travel home by ems when hospital bed arrives at home

## 2016-04-03 LAB — CULTURE, BLOOD (SINGLE): CULTURE: NO GROWTH

## 2016-04-07 ENCOUNTER — Telehealth: Payer: Self-pay | Admitting: Cardiovascular Disease

## 2016-04-07 NOTE — Telephone Encounter (Signed)
Spoke w/ [name].  She reports that pt has had difficulty breathing through her nose since being discharged from the hospital.   She can breathe through her nose, she just reports that it is easier to breathe through her mouth. She denies SOB or any other breathing difficulty. Pt has a humidifier, but is has not been running long.  Advised her that pt's sinuses are most likely dry and recommended that she try to use a neti pot to see if this helps pt. She is unsure if pt will allow this, but she will definitely leave humidifier running.  She is appreciative and will call back w/ any further questions or concerns.

## 2016-04-07 NOTE — Telephone Encounter (Signed)
Pt daughter states pt is complaining that she has to "keep her mouth open" in order to get enough air. Daughter states this has been going on since Friday. States pt is not on oxygen.

## 2016-04-11 ENCOUNTER — Telehealth: Payer: Self-pay | Admitting: Cardiology

## 2016-04-11 NOTE — Telephone Encounter (Signed)
Pt's daughter called due to dark stools and dropping HGB.  Her PCP in [place] wanted cards to admit to [facility].  I explained she should take her mom to ER and hospitalist would admit and we could see in AM.   Not sure she will follow instructions.

## 2016-04-12 ENCOUNTER — Encounter: Payer: Self-pay | Admitting: Emergency Medicine

## 2016-04-12 ENCOUNTER — Emergency Department
Admission: EM | Admit: 2016-04-12 | Discharge: 2016-04-12 | Disposition: A | Discharge status: Home / Self Care | Payer: Medicare HMO | Attending: Emergency Medicine | Admitting: Emergency Medicine

## 2016-04-12 ENCOUNTER — Emergency Department: Payer: Medicare HMO

## 2016-04-12 ENCOUNTER — Telehealth: Payer: Self-pay | Admitting: Cardiovascular Disease

## 2016-04-12 DIAGNOSIS — I110 Hypertensive heart disease with heart failure: Secondary | ICD-10-CM | POA: Insufficient documentation

## 2016-04-12 DIAGNOSIS — Z79899 Other long term (current) drug therapy: Secondary | ICD-10-CM | POA: Insufficient documentation

## 2016-04-12 DIAGNOSIS — Z7982 Long term (current) use of aspirin: Secondary | ICD-10-CM | POA: Insufficient documentation

## 2016-04-12 DIAGNOSIS — R531 Weakness: Secondary | ICD-10-CM | POA: Insufficient documentation

## 2016-04-12 DIAGNOSIS — Z7984 Long term (current) use of oral hypoglycemic drugs: Secondary | ICD-10-CM | POA: Diagnosis not present

## 2016-04-12 DIAGNOSIS — E119 Type 2 diabetes mellitus without complications: Secondary | ICD-10-CM | POA: Insufficient documentation

## 2016-04-12 DIAGNOSIS — R0602 Shortness of breath: Secondary | ICD-10-CM | POA: Diagnosis present

## 2016-04-12 DIAGNOSIS — I5032 Chronic diastolic (congestive) heart failure: Secondary | ICD-10-CM | POA: Diagnosis not present

## 2016-04-12 LAB — URINALYSIS, COMPLETE (UACMP) WITH MICROSCOPIC
Bacteria, UA: NONE SEEN
Bilirubin Urine: NEGATIVE
GLUCOSE, UA: NEGATIVE mg/dL
HGB URINE DIPSTICK: NEGATIVE
Ketones, ur: 5 mg/dL — AB
NITRITE: NEGATIVE
PROTEIN: NEGATIVE mg/dL
Specific Gravity, Urine: 1.012 (ref 1.005–1.030)
pH: 7 (ref 5.0–8.0)

## 2016-04-12 LAB — BASIC METABOLIC PANEL
Anion gap: 9 (ref 5–15)
BUN: 10 mg/dL (ref 6–20)
CALCIUM: 8.7 mg/dL — AB (ref 8.9–10.3)
CO2: 28 mmol/L (ref 22–32)
CREATININE: 0.81 mg/dL (ref 0.44–1.00)
Chloride: 99 mmol/L — ABNORMAL LOW (ref 101–111)
GFR calc Af Amer: >60 mL/min (ref >60)
GLUCOSE: 215 mg/dL — AB (ref 65–99)
Potassium: 4 mmol/L (ref 3.5–5.1)
Sodium: 136 mmol/L (ref 135–145)

## 2016-04-12 LAB — CBC WITH DIFFERENTIAL/PLATELET
BASOS ABS: 0.1 10*3/uL (ref 0–0.1)
Basophils Relative: 1 %
EOS PCT: 1 %
Eosinophils Absolute: 0.1 10*3/uL (ref 0–0.7)
HCT: 28.4 % — ABNORMAL LOW (ref 35.0–47.0)
Hemoglobin: 9.5 g/dL — ABNORMAL LOW (ref 12.0–16.0)
LYMPHS PCT: 5 %
Lymphs Abs: 0.5 10*3/uL — ABNORMAL LOW (ref 1.0–3.6)
MCH: 30.1 pg (ref 26.0–34.0)
MCHC: 33.3 g/dL (ref 32.0–36.0)
MCV: 90.5 fL (ref 80.0–100.0)
MONO ABS: 0.5 10*3/uL (ref 0.2–0.9)
Monocytes Relative: 5 %
Neutro Abs: 9 10*3/uL — ABNORMAL HIGH (ref 1.4–6.5)
Neutrophils Relative %: 88 %
PLATELETS: 210 10*3/uL (ref 150–440)
RBC: 3.14 MIL/uL — ABNORMAL LOW (ref 3.80–5.20)
RDW: 16.4 % — AB (ref 11.5–14.5)
WBC: 10.2 10*3/uL (ref 3.6–11.0)

## 2016-04-12 LAB — TYPE AND SCREEN
ABO/RH(D): A POS
Antibody Screen: NEGATIVE

## 2016-04-12 LAB — INFLUENZA PANEL BY PCR (TYPE A & B)
Influenza A By PCR: NEGATIVE
Influenza B By PCR: NEGATIVE

## 2016-04-12 LAB — TROPONIN I
Troponin I: 0.04 ng/mL (ref <0.03)
Troponin I: 0.04 ng/mL (ref <0.03)

## 2016-04-12 NOTE — Telephone Encounter (Signed)
Pt daughter called , states she called the on call Dr. Maryjane Hurter night, which advised her to go to ED due to pt drropping HGB. Pt did not go to ED last night, and daughter would like a referral to a GI doctor. Please call. (See phone note from Cecilie Kicks, NP from yesterday). Pt daughter states she is going to take pt to [facility] ED.

## 2016-04-12 NOTE — Telephone Encounter (Signed)
S/w with daughter, ok per dpr. She stated her mother is still having jet black bowel movements. Patient has been "talking out of her head" about things from the past and not making sense. Her BP has been 115/52, which she says is low for the patient. She's very concerned about what is going on because her hemoglobin keeps dropping. Advised for her to take her mother to the ER for assessment as advised by Cecilie Kicks last night:  "Isaiah Serge, NP      04/11/16 5:44 PM  Note    Pt's daughter called due to dark stools and dropping HGB.  Her PCP in [place] wanted cards to admit to [place].  I explained she should take her mom to ER and hospitalist would admit and we could see in AM.   Not sure she will follow instructions.         She verbalize understanding and said they are heading that way now.

## 2016-04-12 NOTE — Progress Notes (Signed)
[facility]  Patient Status: Active  [facility] is providing the following services: SN/PT/OT/HHA  If patient discharges after hours, please call [phone].   [name] 04/12/2016, 1:14 PM

## 2016-04-12 NOTE — Discharge Instructions (Signed)
Return to the ER for fever, severe headache, chest pain, dizziness, abdominal pain or any new symptoms concerning to you.

## 2016-04-12 NOTE — ED Notes (Signed)
Patient transported to X-ray 

## 2016-04-12 NOTE — ED Triage Notes (Signed)
Pt c/o weakness, advised by PCP to be evaluated by ER for low HGB.

## 2016-04-12 NOTE — ED Provider Notes (Signed)
[facility] Emergency Department Provider Note  ____________________________________________  Time seen: Approximately 11:53 AM  I have reviewed the triage vital signs and the nursing notes.   HISTORY  Chief Complaint Anemia   HPI [name] is a 83 y.o. female with h/o anemia, CHF, DM, HTN, AS, CVA, and recent admission for enterococcus bacteremia of unknown source who presents for evaluation of SOB, generalized weakness, and dropping hgb. According to the patient's daughter she has been complaining of SOB and generalized weakness since leaving the hospital. She was seen by herPCP and has noted to have dropping hemoglobin per daughter and was sent here for GI evaluation. According to her daughter patient was constipated until 2 days ago. She took some laxatives and since then has had multiple bowel movements that are black. Patient is on iron. PCP sent him over here because he is concerned that she is having a GI bleed. According to the daughter patient has been extremely weak and has spent most of her time in bed since leaving the hospital. They do have PT 2x /week that comes to the house. No fever, no chills, no cough, no chest pain, no abdominal pain, no nausea, no vomiting.   Past Medical History:  Diagnosis Date  . Anemia   . Bone disease   . Carotid arterial disease ([place])    a. 08/2014 U/S: Bilateral < 50% stneosis. Patent vertebrals w/ antegrade flow.   . Cervical spine arthritis ([place])   . Chronic diastolic CHF (congestive heart failure) ([place])    a. echo 2014: EF 55-60%, DD;  b. 08/2014 Echo: EF 55-60%, no RWMA, GR1DD; c. 02/2016 Echo: EF 65-70%, Gr1 DD.  Marland Kitchen Cognitive impairment   . Depression   . Diabetes mellitus without complication ([place])   . Essential hypertension   . GERD (gastroesophageal reflux disease)   . Glaucoma   . Gout   . Hiatal hernia   . History of renal impairment   . Hyperlipidemia   . Hypotension    a. Related to Norvasc -  11/2014 ED visit.  . Multinodular goiter    a. Noted incidentally on CT 07/2012 and carotid U/S 08/2014;  b. Nl TSH 11/2014.  Marland Kitchen Paroxysmal SVT (supraventricular tachycardia) ([facility])    a. on Toprol   . Prolapsed uterus   . Severe aortic stenosis    a. echo 08/2014: EF 55-60%, no RWMA, GR1DD, severe AS, mild AI, Mean gradient (S): 38 mm Hg. Valve area (VTI): 0.78 cm^2., mild-mod MR, LA mildly dilated, atrial septal aneurysm, mild TR;  b. 02/2016 Echo: EF 65-70%, Gr1 DD, sev AS, mild to mod AI, Valve area (VTI): 0.94 cm^2, (Vmax) 0.96 cm^2, (Vmean) 0.93cm^2, mild MR, mildly dil LA, nl RV fxn, PASP 7mmHg.  . Stroke [facility])    a. CT head 07/2014 showed small thalamic infarct  . Vitamin deficiency     Patient Active Problem List   Diagnosis Date Noted  . Enterococcal bacteremia 03/30/2016  . Viral upper respiratory tract infection   . Acute back pain   . Sepsis ([place])   . Elevated troponin   . CAP (community acquired pneumonia) 03/28/2016  . Leg swelling 09/14/2015  . Essential hypertension   . Hyperlipidemia   . Diabetes mellitus without complication ([place])   . Paroxysmal SVT (supraventricular tachycardia) ([place])   . Multinodular goiter   . Carotid arterial disease ([place])   . Hypotension   . Severe aortic valve stenosis   . NSTEMI (non-ST elevated myocardial  infarction) ([place]) 09/10/2014  . Renal failure 08/28/2014  . SVT (supraventricular tachycardia) ([place]) 12/17/2012  . Diabetes mellitus, type 2 ([place]) 12/17/2012  . SOB (shortness of breath) 12/17/2012  . Essential hypertension, malignant 12/17/2012  . Chronic diastolic CHF (congestive heart failure) ([place]) 12/17/2012    Past Surgical History:  Procedure Laterality Date  . TEE WITHOUT CARDIOVERSION N/A 03/31/2016   Procedure: TRANSESOPHAGEAL ECHOCARDIOGRAM (TEE);  Surgeon: Minna Merritts, MD;  Location: [facility] ORS;  Service: Cardiovascular;  Laterality: N/A;  . TUBAL LIGATION      Prior to Admission medications   Medication Sig Start Date  End Date Taking? Authorizing Provider  allopurinol (ZYLOPRIM) 300 MG tablet Take 300 mg by mouth daily.   Yes Historical Provider, MD  amoxicillin (AMOXIL) 500 MG capsule Take 500 mg by mouth 2 (two) times daily.   Yes Historical Provider, MD  brimonidine (ALPHAGAN P) 0.1 % SOLN Place 1 drop into the right eye 2 (two) times daily.   Yes Historical Provider, MD  cholecalciferol (VITAMIN D) 1000 UNITS tablet Take 2,000 Units by mouth daily.    Yes Historical Provider, MD  Dorzolamide HCl-Timolol Mal (COSOPT OP) Place 1 drop into the right eye 2 (two) times daily.    Yes Historical Provider, MD  doxazosin (CARDURA) 4 MG tablet Take 1 tablet (4 mg total) by mouth 2 (two) times daily. 09/14/15  Yes Minna Merritts, MD  fluconazole (DIFLUCAN) 150 MG tablet Take 150 mg by mouth daily.   Yes Historical Provider, MD  furosemide (LASIX) 20 MG tablet Take 1 tablet (20 mg total) by mouth daily. 03/23/16 04/22/16 Yes Minna Merritts, MD  gabapentin (NEURONTIN) 300 MG capsule Take 300 mg by mouth 2 (two) times daily.   Yes Historical Provider, MD  glimepiride (AMARYL) 1 MG tablet Take 1 mg by mouth daily with breakfast.   Yes Historical Provider, MD  hydrALAZINE (APRESOLINE) 50 MG tablet Take 1 tablet (50 mg total) by mouth 3 (three) times daily. Patient taking differently: Take 25 mg by mouth daily.  01/14/16 04/13/16 Yes Minna Merritts, MD  iron polysaccharides (NIFEREX) 150 MG capsule Take 150 mg by mouth 2 (two) times daily.   Yes Historical Provider, MD  isosorbide mononitrate (IMDUR) 60 MG 24 hr tablet Take 1 tablet (60 mg total) by mouth 2 (two) times daily. 12/31/15 12/30/16 Yes Minna Merritts, MD  latanoprost (XALATAN) 0.005 % ophthalmic solution Place 1 drop into the right eye at bedtime. 07/03/14  Yes Historical Provider, MD  lidocaine (LIDODERM) 5 % Place 1 patch onto the skin daily. Remove & Discard patch within 12 hours or as directed by MD 04/01/16  Yes Fritzi Mandes, MD  lisinopril (PRINIVIL,ZESTRIL) 40  MG tablet Take 40 mg by mouth at bedtime.   Yes Historical Provider, MD  lovastatin (MEVACOR) 40 MG tablet Take 40 mg by mouth at bedtime.   Yes Historical Provider, MD  metoprolol succinate (TOPROL-XL) 25 MG 24 hr tablet Take 3 tablets (75 mg total) by mouth daily. Take with or immediately following a meal. 09/12/14  Yes Vaughan Basta, MD  pantoprazole (PROTONIX) 40 MG tablet Take 40 mg by mouth 2 (two) times daily.   Yes Historical Provider, MD  pilocarpine (PILOCAR) 4 % ophthalmic solution Place 1 drop into the right eye 4 (four) times daily.    Yes Historical Provider, MD  potassium chloride (K-DUR) 10 MEQ tablet Take 1 tablet (10 mEq total) by mouth daily. Take with lasix 03/23/16 04/22/16 Yes Kathlene November  Gollan, MD  tiZANidine (ZANAFLEX) 4 MG tablet Take 1 tablet by mouth 3 (three) times daily as needed. 08/08/14  Yes Historical Provider, MD  aspirin 81 MG tablet Take 81 mg by mouth daily.    Historical Provider, MD  feeding supplement, ENSURE ENLIVE, (ENSURE ENLIVE) LIQD Take 237 mLs by mouth 2 (two) times daily between meals. 04/01/16   Fritzi Mandes, MD  traMADol (ULTRAM) 50 MG tablet Take 1 tablet (50 mg total) by mouth every 6 (six) hours as needed for moderate pain. 04/01/16   Fritzi Mandes, MD    Allergies Metformin and related  Family History  Problem Relation Age of Onset  . Glaucoma Mother   . Diabetes Mother   . Peptic Ulcer Father   . Colitis Father     Social History Social History  Substance Use Topics  . Smoking status: Never Smoker  . Smokeless tobacco: Never Used  . Alcohol use No    Review of Systems  Constitutional: Negative for fever. + generalized weakness Eyes: Negative for visual changes. ENT: Negative for sore throat. Neck: No neck pain  Cardiovascular: Negative for chest pain. Respiratory: +shortness of breath. Gastrointestinal: Negative for abdominal pain, vomiting. + diarrhea. Genitourinary: Negative for dysuria. Musculoskeletal: Negative for back  pain. Skin: Negative for rash. Neurological: Negative for headaches, weakness or numbness. Psych: No SI or HI  ____________________________________________   PHYSICAL EXAM:  VITAL SIGNS: ED Triage Vitals [04/12/16 1130]  Enc Vitals Group     BP (!) 140/43     Pulse Rate 93     Resp 20     Temp 98.6 F (37 C)     Temp Source Oral     SpO2 94 %     Weight 154 lb (69.9 kg)     Height      Head Circumference      Peak Flow      Pain Score      Pain Loc      Pain Edu?      Excl. in [place]?     Constitutional: Alert and oriented. Well appearing and in no apparent distress. HEENT:      Head: Normocephalic and atraumatic.         Eyes: Conjunctivae are normal. Sclera is non-icteric. EOMI. PERRL      Mouth/Throat: Mucous membranes are moist.       Neck: Supple with no signs of meningismus. Cardiovascular: Regular rate and rhythm. No murmurs, gallops, or rubs. 2+ symmetrical distal pulses are present in all extremities. No JVD. Respiratory: Normal respiratory effort. Lungs are clear to auscultation bilaterally. No wheezes, crackles, or rhonchi.  Gastrointestinal: Soft, non tender, and non distended with positive bowel sounds. No rebound or guarding. Genitourinary: No CVA tenderness. Rectal exam showing guaiac negative dark stool Musculoskeletal: Nontender with normal range of motion in all extremities. No edema, cyanosis, or erythema of extremities. Neurologic: Normal speech and language. Face is symmetric. Moving all extremities. No gross focal neurologic deficits are appreciated. Skin: Skin is warm, dry and intact. No rash noted. Psychiatric: Mood and affect are normal. Speech and behavior are normal.  ____________________________________________   LABS (all labs ordered are listed, but only abnormal results are displayed)  Labs Reviewed  CBC WITH DIFFERENTIAL/PLATELET - Abnormal; Notable for the following:       Result Value   RBC 3.14 (*)    Hemoglobin 9.5 (*)    HCT 28.4  (*)    RDW 16.4 (*)    Neutro  Abs 9.0 (*)    Lymphs Abs 0.5 (*)    All other components within normal limits  BASIC METABOLIC PANEL - Abnormal; Notable for the following:    Chloride 99 (*)    Glucose, Bld 215 (*)    Calcium 8.7 (*)    All other components within normal limits  TROPONIN I - Abnormal; Notable for the following:    Troponin I 0.04 (*)    All other components within normal limits  URINALYSIS, COMPLETE (UACMP) WITH MICROSCOPIC - Abnormal; Notable for the following:    Color, Urine YELLOW (*)    APPearance HAZY (*)    Ketones, ur 5 (*)    Leukocytes, UA SMALL (*)    Squamous Epithelial / LPF 6-30 (*)    All other components within normal limits  TROPONIN I - Abnormal; Notable for the following:    Troponin I 0.04 (*)    All other components within normal limits  INFLUENZA PANEL BY PCR (TYPE A & B)  TYPE AND SCREEN   ____________________________________________  EKG  ED ECG REPORT I, Rudene Re, the attending physician, personally viewed and interpreted this ECG.  Normal sinus rhythm, rate of 79, first-degree AV block, normal QRS and QTc intervals, left axis deviation, no ST elevations or depressions, T-wave inversion in lead 3. Unchanged from prior ____________________________________________  RADIOLOGY  CXR: Negative  ____________________________________________   PROCEDURES  Procedure(s) performed: None Procedures Critical Care performed:  None ____________________________________________   INITIAL IMPRESSION / ASSESSMENT AND PLAN / ED COURSE  83 y.o. female with h/o anemia, CHF, DM, HTN, AS, CVA, and recent admission for enterococcus bacteremia of unknown source who presents for evaluation of SOB and generalized weakness since being discharged from the hospital. Rectal exam showing dark stool guaiac negative. Patient's vital signs are within normal limits. Physical exam with no acute findings. We'll check basic blood work to evaluate patient's  hemoglobin. We'll also get a chest x-ray and flu, EKG and troponin.    ED COURSE:  Work up with no acute source for patient's generalized weakness. Hemoglobin is stable at 9.5. EKG with no evidence of ischemia. Troponin 2 pending down from her most recent NSTEMI. Chest x-ray with no evidence of edema or infiltrate. CMP with no acute findings. Flu negative. UA with no evidence of urinary tract infection. Patient's vitals continue to look normal with no fever, no tachycardia, no hypoxia, no tachypnea. Patient's presentation is possibly deconditioning from recent admission. Recommended close follow-up with PCP in 24 hours for recheck. We'll discharge home at this time with supportive care  Pertinent labs & imaging results that were available during my care of the patient were reviewed by me and considered in my medical decision making (see chart for details).    ____________________________________________   FINAL CLINICAL IMPRESSION(S) / ED DIAGNOSES  Final diagnoses:  Generalized weakness      NEW MEDICATIONS STARTED DURING THIS VISIT:  New Prescriptions   No medications on file     Note:  This document was prepared using Dragon voice recognition software and may include unintentional dictation errors.    Rudene Re, MD 04/12/16 1556

## 2016-04-14 NOTE — Telephone Encounter (Signed)
Can we follow up on this call She needs PMD to assist her with treatment, needs GI referral

## 2016-04-15 ENCOUNTER — Telehealth: Payer: Self-pay | Admitting: *Deleted

## 2016-04-15 NOTE — Telephone Encounter (Signed)
Left voicemail message to call back so we can touch bases.

## 2016-04-15 NOTE — Telephone Encounter (Signed)
-----   Message from Minna Merritts, MD sent at 04/14/2016 11:15 PM EST -----   ----- Message ----- From: Isaiah Serge, NP Sent: 04/11/2016   5:47 PM To: Minna Merritts, MD

## 2016-04-18 ENCOUNTER — Telehealth: Payer: Self-pay | Admitting: Cardiovascular Disease

## 2016-04-18 NOTE — Telephone Encounter (Signed)
Pt daughter states pt was in ED, they states bleeding was coming from her uterus prolapse. States she will f/u with GYN and pain management.  States pt BP is low- 147/62  Taken on 2/15, 2/8 -121/52 Later that day 135/59, 2/7-95/49 taken at 5:14 pm States pt has not been taking her BP medication due to her low BP.

## 2016-04-18 NOTE — Telephone Encounter (Signed)
S/w patient's daughter, [name], ok per DPR. We discussed the BP readings as recorded in previous entry. She says patient denies any chest pain, SOB, dizziness. Patient does have back pain. Dede Query, RN offered patient appt with Dr Rockey Situ for tomorrow 04/19/16 at [place]. She said that would be great and the patient will be able to come for that appt.

## 2016-04-19 ENCOUNTER — Encounter: Payer: Self-pay | Admitting: Cardiovascular Disease

## 2016-04-19 ENCOUNTER — Ambulatory Visit (INDEPENDENT_AMBULATORY_CARE_PROVIDER_SITE_OTHER): Payer: Medicare HMO | Admitting: Cardiovascular Disease

## 2016-04-19 ENCOUNTER — Encounter: Payer: Self-pay | Admitting: Internal Medicine

## 2016-04-19 VITALS — BP 124/56 | HR 75 | Wt 141.0 lb

## 2016-04-19 DIAGNOSIS — I10 Essential (primary) hypertension: Secondary | ICD-10-CM | POA: Diagnosis not present

## 2016-04-19 DIAGNOSIS — I214 Non-ST elevation (NSTEMI) myocardial infarction: Secondary | ICD-10-CM

## 2016-04-19 DIAGNOSIS — I471 Supraventricular tachycardia: Secondary | ICD-10-CM

## 2016-04-19 DIAGNOSIS — I350 Nonrheumatic aortic (valve) stenosis: Secondary | ICD-10-CM

## 2016-04-19 DIAGNOSIS — I5032 Chronic diastolic (congestive) heart failure: Secondary | ICD-10-CM

## 2016-04-19 MED ORDER — FUROSEMIDE 20 MG PO TABS: 20.0000 mg | ORAL_TABLET | ORAL | 6 refills | Status: DC | PRN

## 2016-04-19 NOTE — Patient Instructions (Addendum)
We will ask pulmonary how to order overnight oximetry study   Medication Instructions:   Hold the hydralazine  Decrease the lasix down to every other day Take extra lasix for ankle swelling  Decrease the cardura down to one a day (hold the pm dose)  Monitor blood pressure Please call if blood pressure runs low  Labwork:  No new labs needed  Testing/Procedures:  No further testing at this time   I recommend watching educational videos on topics of interest to you at:       [email or web address]  Enter code: [email or web address]    Follow-Up: It was a pleasure seeing you in the office today. Please call us if you have new issues that need to be addressed before your next appt.  [phone]  Your physician wants you to follow-up in: 3 months.  You will receive a reminder letter in the mail two months in advance. If you don't receive a letter, please call our office to schedule the follow-up appointment.  If you need a refill on your cardiac medications before your next appointment, please call your pharmacy.

## 2016-04-19 NOTE — Progress Notes (Signed)
Cardiology Office Note  Date:  04/19/2016   ID:  [name], DOB [birth year], MRN [id number]  PCP:  [name], MD   Chief Complaint  Patient presents with  . other    Early follow up. Was in the hospital for generalized weakness and back pain.  Meds reviewed verbally with patient.     HPI:  [name] is a pleasant 83 year old woman with a history of diabetes, hypertension, SVT,Moderate aortic valve stenosis with hospital admission July 2016 for SVT after she developed numerous bug bites with associated pain. She presents for follow-up of her arrhythmia, hypertension and lower extremity swelling Amlodipine previously held with improvement of her leg swelling Previously started on Lasix  In follow-up today she presents with her daughter Recent hospitalization end of January with discharge 04/01/2016 Records reviewed She had sepsis, enterococcus , had TEE done in the hospital to rule out endocarditis Moderate aortic valve stenosis noted  On Lasix she has had weight loss, improvement of her shortness of breath and leg swelling  Went to the emergency room 2/13 Was fatigue, shortness breath Felt it was from anemia, deconditioning. Was not admitted to the hospital  Working periodically at home with PT, still very weak Family chose to bring her home, did not go to skilled nursing facility  Lab work as below HCT 35 01/27/2016 03/25/16 HCT 28.4  She is not eating as much, only eating small amounts, weight was down 15 pounds on her last clinic visit  Daughter concerned as blood pressures running low She has been holding doses of hydralazine, still running low HCTZ previously held for renal dysfunction  Denies any significant chest pain concerning for angina. Denies having any tachycardia concerning for arrhythmia  EKG on today's visit shows normal sinus rhythm with rate 75 bpm, no significant ST or T-wave changes  Other past medical history  on her hospital  admission July 2016,She reports that she had a rash at the time. Turns out she went walking in her yard, developed insect bites all of her legs and abdomen (chiger bits). She was in a tremendous amount of discomfort with severe itching. Likely secondary to that, she developed arrhythmia. she converted back to normal sinus rhythm in the hospital. At the time of discharge her metoprolol was increased from 50 up to 75 mg daily. She is felt well with no further episodes of tachycardia. She currently lives at home, independent  admitted to the hospital 09/20/2012 discharged on 09/25/2012 for Klebsiella UTI, shortness breath, tachycardia, SVT. Arrival to the hospital, she was very weak and was unable to swallow. She was felt to be very dehydrated. Her potassium was repleted, she was started on metoprolol and Cardizem.  Echocardiogram in the hospital 09/19/2012 showed ejection fraction 40-97%, diastolic dysfunction, mild LVH, moderate aortic valve stenosis, normal RV function and size  CT scan of the neck showed enlargement of the thyroid with multinodular appearance, atherosclerotic disease, right pleural effusion Lab work in the hospital 09/11/2012 showing creatinine 1.57, BUN 44 Followup blood work showed creatinine 1.07, BUN 17 on July 23  PMH:   has a past medical history of Anemia; Bone disease; Carotid arterial disease ([place]); Cervical spine arthritis ([place]); Chronic diastolic CHF (congestive heart failure) ([place]); Cognitive impairment; Depression; Diabetes mellitus without complication ([place]); Essential hypertension; GERD (gastroesophageal reflux disease); Glaucoma; Gout; Hiatal hernia; History of renal impairment; Hyperlipidemia; Hypotension; Multinodular goiter; Paroxysmal SVT (supraventricular tachycardia) ([place]); Prolapsed uterus; Severe aortic stenosis; Stroke [facility]); and Vitamin deficiency.  PSH:  Past Surgical History:  Procedure Laterality Date  . TEE WITHOUT CARDIOVERSION N/A 03/31/2016    Procedure: TRANSESOPHAGEAL ECHOCARDIOGRAM (TEE);  Surgeon: Minna Merritts, MD;  Location: [facility] ORS;  Service: Cardiovascular;  Laterality: N/A;  . TUBAL LIGATION      Current Outpatient Prescriptions  Medication Sig Dispense Refill  . allopurinol (ZYLOPRIM) 300 MG tablet Take 300 mg by mouth daily.    Marland Kitchen amoxicillin (AMOXIL) 500 MG capsule Take 500 mg by mouth 2 (two) times daily.    Marland Kitchen aspirin 81 MG tablet Take 81 mg by mouth daily.    . brimonidine (ALPHAGAN P) 0.1 % SOLN Place 1 drop into the right eye 2 (two) times daily.    . cholecalciferol (VITAMIN D) 1000 UNITS tablet Take 2,000 Units by mouth daily.     . Dorzolamide HCl-Timolol Mal (COSOPT OP) Place 1 drop into the right eye 2 (two) times daily.     Marland Kitchen doxazosin (CARDURA) 4 MG tablet Take 1 tablet (4 mg total) by mouth 2 (two) times daily. 60 tablet 11  . feeding supplement, ENSURE ENLIVE, (ENSURE ENLIVE) LIQD Take 237 mLs by mouth 2 (two) times daily between meals. 237 mL 12  . fluconazole (DIFLUCAN) 150 MG tablet Take 150 mg by mouth daily.    . furosemide (LASIX) 20 MG tablet Take 1 tablet (20 mg total) by mouth daily. 30 tablet 6  . gabapentin (NEURONTIN) 300 MG capsule Take 300 mg by mouth 2 (two) times daily.    Marland Kitchen glimepiride (AMARYL) 1 MG tablet Take 1 mg by mouth daily with breakfast.    . iron polysaccharides (NIFEREX) 150 MG capsule Take 150 mg by mouth 2 (two) times daily.    . isosorbide mononitrate (IMDUR) 60 MG 24 hr tablet Take 1 tablet (60 mg total) by mouth 2 (two) times daily. 60 tablet 6  . latanoprost (XALATAN) 0.005 % ophthalmic solution Place 1 drop into the right eye at bedtime.  3  . lidocaine (LIDODERM) 5 % Place 1 patch onto the skin daily. Remove & Discard patch within 12 hours or as directed by MD 15 patch 0  . lisinopril (PRINIVIL,ZESTRIL) 40 MG tablet Take 40 mg by mouth at bedtime.    . lovastatin (MEVACOR) 40 MG tablet Take 40 mg by mouth at bedtime.    . metoprolol succinate (TOPROL-XL) 25 MG 24 hr  tablet Take 3 tablets (75 mg total) by mouth daily. Take with or immediately following a meal. 120 tablet 0  . pantoprazole (PROTONIX) 40 MG tablet Take 40 mg by mouth 2 (two) times daily.    . pilocarpine (PILOCAR) 4 % ophthalmic solution Place 1 drop into the right eye 4 (four) times daily.     . potassium chloride (K-DUR) 10 MEQ tablet Take 1 tablet (10 mEq total) by mouth daily. Take with lasix 30 tablet 6  . tiZANidine (ZANAFLEX) 4 MG tablet Take 1 tablet by mouth 3 (three) times daily as needed.  1  . traMADol (ULTRAM) 50 MG tablet Take 1 tablet (50 mg total) by mouth every 6 (six) hours as needed for moderate pain. 30 tablet 0  . hydrALAZINE (APRESOLINE) 50 MG tablet Take 1 tablet (50 mg total) by mouth 3 (three) times daily. (Patient taking differently: Take 25 mg by mouth daily. ) 270 tablet 3   No current facility-administered medications for this visit.      Allergies:   Metformin and related   Social History:  The patient  reports that she  has never smoked. She has never used smokeless tobacco. She reports that she does not drink alcohol or use drugs.   Family History:   family history includes Colitis in her father; Diabetes in her mother; Glaucoma in her mother; Peptic Ulcer in her father.    Review of Systems: Review of Systems  Constitutional: Positive for malaise/fatigue.  Respiratory: Negative.   Cardiovascular: Negative.   Gastrointestinal: Negative.   Musculoskeletal: Negative.        Leg weakness  Neurological: Negative.   Psychiatric/Behavioral: Negative.   All other systems reviewed and are negative.    PHYSICAL EXAM: VS:  BP (!) 124/56 (BP Location: Left Arm, Patient Position: Sitting, Cuff Size: Normal)   Pulse 75   Wt 141 lb (64 kg)   BMI 26.64 kg/m  , BMI Body mass index is 26.64 kg/m. GEN: Well nourished, well developed, in no acute distress  HEENT: normal  Neck: no JVD, carotid bruits, or masses Cardiac: RRR; no murmurs, rubs, or gallops,no  edema  Respiratory:  clear to auscultation bilaterally, normal work of breathing GI: soft, nontender, nondistended, + BS MS: no deformity or atrophy  Skin: warm and dry, no rash Neuro:  Strength and sensation are intact Psych: euthymic mood, full affect    Recent Labs: 03/27/2016: ALT 20 03/28/2016: TSH 0.681 04/12/2016: BUN 10; Creatinine, Ser 0.81; Hemoglobin 9.5; Platelets 210; Potassium 4.0; Sodium 136    Lipid Panel Lab Results  Component Value Date   CHOL 114 09/11/2014   HDL 40 (L) 09/11/2014   LDLCALC 55 09/11/2014   TRIG 97 09/11/2014      Wt Readings from Last 3 Encounters:  04/19/16 141 lb (64 kg)  04/12/16 154 lb (69.9 kg)  04/01/16 154 lb 14.4 oz (70.3 kg)       ASSESSMENT AND PLAN:  SVT (supraventricular tachycardia) ([place]) - Plan: EKG 12-Lead Patient denies any tachycardia concerning for arrhythmia  Essential hypertension, malignant - Plan: EKG 12-Lead Blood pressure running low, likely after significant diuresis over the past several months Recommended she hold hydralazine take only as needed Recommended she decrease Cardura down to 4 mg once a day Close monitoring of blood pressure, call our office if blood pressure continues to run low We would wean off the Cardura  Chronic diastolic CHF (congestive heart failure) ([place]) - Plan: EKG 12-Lead Leg edema resolved Rapid weight loss over the past several months previously 160 pounds now down to 140 pounds BMP last week showing stable renal function Recommended she decrease Lasix down to every other day  NSTEMI (non-ST elevated myocardial infarction) ([place]) - Plan: EKG 12-Lead  Moderate aortic valve stenosis Medical management at this time, moderate in nature based off TEE   Total encounter time more than 25 minutes  Greater than 50% was spent in counseling and coordination of care with the patient   Disposition:   F/U  3 months   Orders Placed This Encounter  Procedures  . EKG 12-Lead      Signed, Esmond Plants, M.D., Ph.D. 04/19/2016  [place], [place]

## 2016-04-22 ENCOUNTER — Telehealth: Payer: Self-pay | Admitting: Cardiovascular Disease

## 2016-04-22 NOTE — Telephone Encounter (Signed)
Patients daughter called in with blood pressures at the request of Dr. Rockey Situ. She states that she was concerned that the diastolic numbers were getting low and wanted to know if they should make any medication adjustments. Reviewed blood pressures with her and let her know these looked pretty good but that I would share with Dr. Rockey Situ to see if he wants to change anything. Instructed her to not make any medication changes at this time and to continue monitoring. She states that she has been saying that she is not getting enough air and is wanting oxygen. Let her know that they would need to call the pulmonary physician for this. Instructed her to call on call physician if symptoms should worsen and that we would be in touch if Dr. Rockey Situ has any recommendations.

## 2016-04-22 NOTE — Telephone Encounter (Signed)
Pt daughter is calling with BP readings: 2/23-2:55 pm 114/53 HR 75, 10 am -93/47 HR 68 2/22-8:30 pm 122/46 HR 80, 11:32 am 124/54 HR 73 8:35 am 135/61 HR 67 2/21-8:04 pm 146/60 HR 74, 6:27 pm 128/48 HR 68, 8:52 am 140/61 HR 64 States pt is still complaining of "not having enough air". States her "mouth is beginning to fill up with that saliva again".

## 2016-04-24 NOTE — Telephone Encounter (Signed)
Patient could stay on lasix daily for SOB May need 2 doses for severe symptoms I am not concerned about the low diastolic pressure Though would leave it up to the daughter, she can hold the cardura all together

## 2016-04-25 ENCOUNTER — Encounter: Payer: Self-pay | Admitting: *Deleted

## 2016-04-25 ENCOUNTER — Emergency Department
Admission: EM | Admit: 2016-04-25 | Discharge: 2016-04-25 | Disposition: A | Discharge status: Home / Self Care | Payer: Medicare HMO | Attending: Emergency Medicine | Admitting: Emergency Medicine

## 2016-04-25 ENCOUNTER — Emergency Department: Payer: Medicare HMO

## 2016-04-25 DIAGNOSIS — I2510 Atherosclerotic heart disease of native coronary artery without angina pectoris: Secondary | ICD-10-CM | POA: Diagnosis not present

## 2016-04-25 DIAGNOSIS — R42 Dizziness and giddiness: Secondary | ICD-10-CM | POA: Diagnosis not present

## 2016-04-25 DIAGNOSIS — R531 Weakness: Secondary | ICD-10-CM | POA: Diagnosis not present

## 2016-04-25 DIAGNOSIS — I5032 Chronic diastolic (congestive) heart failure: Secondary | ICD-10-CM | POA: Diagnosis not present

## 2016-04-25 DIAGNOSIS — E119 Type 2 diabetes mellitus without complications: Secondary | ICD-10-CM | POA: Insufficient documentation

## 2016-04-25 DIAGNOSIS — I110 Hypertensive heart disease with heart failure: Secondary | ICD-10-CM | POA: Diagnosis not present

## 2016-04-25 LAB — CBC
HCT: 33.7 % — ABNORMAL LOW (ref 35.0–47.0)
HEMOGLOBIN: 11.4 g/dL — AB (ref 12.0–16.0)
MCH: 30.2 pg (ref 26.0–34.0)
MCHC: 33.7 g/dL (ref 32.0–36.0)
MCV: 89.7 fL (ref 80.0–100.0)
Platelets: 230 10*3/uL (ref 150–440)
RBC: 3.75 MIL/uL — AB (ref 3.80–5.20)
RDW: 17 % — ABNORMAL HIGH (ref 11.5–14.5)
WBC: 8.9 10*3/uL (ref 3.6–11.0)

## 2016-04-25 LAB — BASIC METABOLIC PANEL
ANION GAP: 11 (ref 5–15)
BUN: 15 mg/dL (ref 6–20)
CALCIUM: 9.5 mg/dL (ref 8.9–10.3)
CO2: 24 mmol/L (ref 22–32)
Chloride: 102 mmol/L (ref 101–111)
Creatinine, Ser: 0.95 mg/dL (ref 0.44–1.00)
GFR, EST AFRICAN AMERICAN: 60 mL/min — AB (ref >60)
GFR, EST NON AFRICAN AMERICAN: 52 mL/min — AB (ref >60)
Glucose, Bld: 260 mg/dL — ABNORMAL HIGH (ref 65–99)
Potassium: 4.4 mmol/L (ref 3.5–5.1)
SODIUM: 137 mmol/L (ref 135–145)

## 2016-04-25 LAB — URINALYSIS, COMPLETE (UACMP) WITH MICROSCOPIC
BILIRUBIN URINE: NEGATIVE
GLUCOSE, UA: NEGATIVE mg/dL
Hgb urine dipstick: NEGATIVE
KETONES UR: NEGATIVE mg/dL
LEUKOCYTES UA: NEGATIVE
Nitrite: NEGATIVE
PH: 7 (ref 5.0–8.0)
Protein, ur: NEGATIVE mg/dL
SPECIFIC GRAVITY, URINE: 1.008 (ref 1.005–1.030)

## 2016-04-25 LAB — TROPONIN I

## 2016-04-25 MED ORDER — LORAZEPAM 1 MG PO TABS: 1.0000 mg | ORAL_TABLET | Freq: Two times a day (BID) | ORAL | 0 refills | Status: DC

## 2016-04-25 NOTE — ED Triage Notes (Signed)
Pt brought in via EMS with Complaint of dizziness, blood sugar 302, pt denies pain or any other symptoms

## 2016-04-25 NOTE — Telephone Encounter (Signed)
Spoke w/ pt's daughter.   Advised her of Dr. Donivan Scull recommendation.  She states that she is in [place] and just got a call that 911 was called out for pt's severe SOB. She asks that I call back to leave these instructions on her vm and she is on her way to find out what is going on.  Asked her to call back if we can be of further assistance.

## 2016-04-25 NOTE — ED Notes (Signed)
Pt ambulated in room O2 sat 98% while ambulating on room air

## 2016-04-25 NOTE — ED Provider Notes (Signed)
[facility] Emergency Department Provider Note        Time seen: ----------------------------------------- 11:24 AM on 04/25/2016 -----------------------------------------    I have reviewed the triage vital signs and the nursing notes.   HISTORY  Chief Complaint No chief complaint on file.    HPI [name] is a 83 y.o. female who presents to the ER being brought by EMS from a group home for dizziness and weakness. Patient was able to ambulate when EMS arrived and did not feel lightheaded at that time. Patient had slowly been hospitalized for sepsis. She denies any complaints at this time.   Past Medical History:  Diagnosis Date  . Anemia   . Bone disease   . Carotid arterial disease ([place])    a. 08/2014 U/S: Bilateral < 50% stneosis. Patent vertebrals w/ antegrade flow.   . Cervical spine arthritis ([place])   . Chronic diastolic CHF (congestive heart failure) ([place])    a. echo 2014: EF 55-60%, DD;  b. 08/2014 Echo: EF 55-60%, no RWMA, GR1DD; c. 02/2016 Echo: EF 65-70%, Gr1 DD.  Marland Kitchen Cognitive impairment   . Depression   . Diabetes mellitus without complication ([place])   . Essential hypertension   . GERD (gastroesophageal reflux disease)   . Glaucoma   . Gout   . Hiatal hernia   . History of renal impairment   . Hyperlipidemia   . Hypotension    a. Related to Norvasc - 11/2014 ED visit.  . Multinodular goiter    a. Noted incidentally on CT 07/2012 and carotid U/S 08/2014;  b. Nl TSH 11/2014.  Marland Kitchen Paroxysmal SVT (supraventricular tachycardia) ([facility])    a. on Toprol   . Prolapsed uterus   . Severe aortic stenosis    a. echo 08/2014: EF 55-60%, no RWMA, GR1DD, severe AS, mild AI, Mean gradient (S): 38 mm Hg. Valve area (VTI): 0.78 cm^2., mild-mod MR, LA mildly dilated, atrial septal aneurysm, mild TR;  b. 02/2016 Echo: EF 65-70%, Gr1 DD, sev AS, mild to mod AI, Valve area (VTI): 0.94 cm^2, (Vmax) 0.96 cm^2, (Vmean) 0.93cm^2, mild MR, mildly dil LA, nl RV fxn,  PASP 76mmHg.  . Stroke [facility])    a. CT head 07/2014 showed small thalamic infarct  . Vitamin deficiency     Patient Active Problem List   Diagnosis Date Noted  . Enterococcal bacteremia 03/30/2016  . Viral upper respiratory tract infection   . Acute back pain   . Sepsis ([place])   . Elevated troponin   . CAP (community acquired pneumonia) 03/28/2016  . Leg swelling 09/14/2015  . Essential hypertension   . Hyperlipidemia   . Diabetes mellitus without complication ([place])   . Paroxysmal SVT (supraventricular tachycardia) ([place])   . Multinodular goiter   . Carotid arterial disease ([place])   . Hypotension   . Severe aortic valve stenosis   . NSTEMI (non-ST elevated myocardial infarction) ([place]) 09/10/2014  . Renal failure 08/28/2014  . SVT (supraventricular tachycardia) ([place]) 12/17/2012  . Diabetes mellitus, type 2 ([place]) 12/17/2012  . SOB (shortness of breath) 12/17/2012  . Essential hypertension, malignant 12/17/2012  . Chronic diastolic CHF (congestive heart failure) ([place]) 12/17/2012    Past Surgical History:  Procedure Laterality Date  . TEE WITHOUT CARDIOVERSION N/A 03/31/2016   Procedure: TRANSESOPHAGEAL ECHOCARDIOGRAM (TEE);  Surgeon: Minna Merritts, MD;  Location: [facility] ORS;  Service: Cardiovascular;  Laterality: N/A;  . TUBAL LIGATION      Allergies Metformin and related  Social History Social  History  Substance Use Topics  . Smoking status: Never Smoker  . Smokeless tobacco: Never Used  . Alcohol use No    Review of Systems Constitutional: Negative for fever. Cardiovascular: Negative for chest pain. Respiratory: Negative for shortness of breath. Gastrointestinal: Negative for abdominal pain, vomiting and diarrhea. Genitourinary: Negative for dysuria. Musculoskeletal: Negative for back pain. Skin: Negative for rash. Neurological: Positive for generalized weakness  10-point ROS otherwise negative.  ____________________________________________   PHYSICAL  EXAM:  VITAL SIGNS: ED Triage Vitals  Enc Vitals Group     BP      Pulse      Resp      Temp      Temp src      SpO2      Weight      Height      Head Circumference      Peak Flow      Pain Score      Pain Loc      Pain Edu?      Excl. in [place]?     Constitutional: Alert and oriented. No distress Eyes: Conjunctivae are normal. PERRL. Normal extraocular movements. ENT   Head: Normocephalic and atraumatic.   Nose: No congestion/rhinnorhea.   Mouth/Throat: Mucous membranes are moist.   Neck: No stridor. Cardiovascular: Normal rate, regular rhythm. No murmurs, rubs, or gallops. Respiratory: Normal respiratory effort without tachypnea nor retractions. Breath sounds are clear and equal bilaterally. No wheezes/rales/rhonchi. Gastrointestinal: Soft and nontender. Normal bowel sounds Musculoskeletal: Nontender with normal range of motion in all extremities. No lower extremity tenderness nor edema. Neurologic:  Normal speech and language. No gross focal neurologic deficits are appreciated.  Skin:  Skin is warm, dry and intact. No rash noted. Psychiatric: Mood and affect are normal. Speech and behavior are normal.  ____________________________________________  EKG: Interpreted by me. Sinus rhythm rate 88 bpm, prolonged PR interval, normal QRS width, normal QT, LVH  ____________________________________________  ED COURSE:  Pertinent labs & imaging results that were available during my care of the patient were reviewed by me and considered in my medical decision making (see chart for details). Patient presents to ER with dizziness or weakness. We will assess with labs and imaging   Procedures ____________________________________________   LABS (pertinent positives/negatives)  Labs Reviewed  BASIC METABOLIC PANEL - Abnormal; Notable for the following:       Result Value   Glucose, Bld 260 (*)    GFR calc non Af Amer 52 (*)    GFR calc Af Amer 60 (*)    All other  components within normal limits  CBC - Abnormal; Notable for the following:    RBC 3.75 (*)    Hemoglobin 11.4 (*)    HCT 33.7 (*)    RDW 17.0 (*)    All other components within normal limits  URINALYSIS, COMPLETE (UACMP) WITH MICROSCOPIC - Abnormal; Notable for the following:    Color, Urine STRAW (*)    APPearance CLEAR (*)    Bacteria, UA RARE (*)    Squamous Epithelial / LPF 0-5 (*)    All other components within normal limits  TROPONIN I    RADIOLOGY Images were viewed by me  Chest x-ray IMPRESSION: Cardiomegaly. No active disease. ____________________________________________  FINAL ASSESSMENT AND PLAN  Dizziness  Plan: Patient with labs and imaging as dictated above. Patient presents to the ER in no distress with a negative workup here. We ambulated her with a pulse oximeter and her oxygen saturation never  dropped below 98%. Family states she complains of shortness of breath a lot but they're unsure if it is anxiety. She is not sleeping well. I will prescribe Ativan for the family to try as needed.   Earleen Newport, MD   Note: This note was generated in part or whole with voice recognition software. Voice recognition is usually quite accurate but there are transcription errors that can and very often do occur. I apologize for any typographical errors that were not detected and corrected.     Earleen Newport, MD 04/25/16 1341

## 2016-04-25 NOTE — ED Notes (Signed)
Pt complained of dizziness at home, pt reports shortness of breath, lungs clear, O2 sat 100% see triage note

## 2016-05-02 ENCOUNTER — Emergency Department: Payer: Medicare HMO

## 2016-05-02 ENCOUNTER — Encounter: Payer: Self-pay | Admitting: Emergency Medicine

## 2016-05-02 ENCOUNTER — Telehealth: Payer: Self-pay | Admitting: Cardiovascular Disease

## 2016-05-02 ENCOUNTER — Emergency Department
Admission: EM | Admit: 2016-05-02 | Discharge: 2016-05-02 | Disposition: A | Discharge status: Home / Self Care | Payer: Medicare HMO | Attending: Emergency Medicine | Admitting: Emergency Medicine

## 2016-05-02 DIAGNOSIS — Z79899 Other long term (current) drug therapy: Secondary | ICD-10-CM | POA: Insufficient documentation

## 2016-05-02 DIAGNOSIS — R531 Weakness: Secondary | ICD-10-CM

## 2016-05-02 DIAGNOSIS — I110 Hypertensive heart disease with heart failure: Secondary | ICD-10-CM | POA: Diagnosis not present

## 2016-05-02 DIAGNOSIS — E119 Type 2 diabetes mellitus without complications: Secondary | ICD-10-CM | POA: Insufficient documentation

## 2016-05-02 DIAGNOSIS — I509 Heart failure, unspecified: Secondary | ICD-10-CM | POA: Insufficient documentation

## 2016-05-02 DIAGNOSIS — Z7982 Long term (current) use of aspirin: Secondary | ICD-10-CM | POA: Diagnosis not present

## 2016-05-02 DIAGNOSIS — E860 Dehydration: Secondary | ICD-10-CM | POA: Insufficient documentation

## 2016-05-02 LAB — CBC
HEMATOCRIT: 35.2 % (ref 35.0–47.0)
Hemoglobin: 11.5 g/dL — ABNORMAL LOW (ref 12.0–16.0)
MCH: 29.8 pg (ref 26.0–34.0)
MCHC: 32.7 g/dL (ref 32.0–36.0)
MCV: 91.3 fL (ref 80.0–100.0)
PLATELETS: 198 10*3/uL (ref 150–440)
RBC: 3.86 MIL/uL (ref 3.80–5.20)
RDW: 17.9 % — AB (ref 11.5–14.5)
WBC: 7.9 10*3/uL (ref 3.6–11.0)

## 2016-05-02 LAB — URINALYSIS, COMPLETE (UACMP) WITH MICROSCOPIC
BACTERIA UA: NONE SEEN
BILIRUBIN URINE: NEGATIVE
HGB URINE DIPSTICK: NEGATIVE
Ketones, ur: 20 mg/dL — AB
LEUKOCYTES UA: NEGATIVE
NITRITE: NEGATIVE
PH: 5 (ref 5.0–8.0)
Protein, ur: NEGATIVE mg/dL
SPECIFIC GRAVITY, URINE: 1.027 (ref 1.005–1.030)

## 2016-05-02 LAB — BASIC METABOLIC PANEL
Anion gap: 9 (ref 5–15)
BUN: 15 mg/dL (ref 6–20)
CALCIUM: 9.5 mg/dL (ref 8.9–10.3)
CO2: 24 mmol/L (ref 22–32)
Chloride: 102 mmol/L (ref 101–111)
Creatinine, Ser: 0.71 mg/dL (ref 0.44–1.00)
GFR calc Af Amer: >60 mL/min (ref >60)
Glucose, Bld: 337 mg/dL — ABNORMAL HIGH (ref 65–99)
POTASSIUM: 4.3 mmol/L (ref 3.5–5.1)
SODIUM: 135 mmol/L (ref 135–145)

## 2016-05-02 LAB — TROPONIN I: Troponin I: 0.03 ng/mL (ref <0.03)

## 2016-05-02 NOTE — Progress Notes (Signed)
Advance care planning  Patient has had progressive decline in her general health over the last 2 months. Her healthcare power of attorney is youngest daughter who is present in the room today along with her sister.  Discussed regarding patient's prognosis with moderate to severe aortic stenosis, worsening weakness over time. Her symptoms are mainly multifactorial. Patient is hopeful this will improve with further treatment. I explained some of her symptoms are chronic and are unlikely to resolve.  On discussing further regarding her CODE STATUS patient was undecided initially. Later she requested her youngest daughter for her opinion was suggested patient being a full code. Does not want prolonged intubation.  CODE STATUS is full code  Time spent 20 minutes

## 2016-05-02 NOTE — Telephone Encounter (Signed)
Spoke w/ [name].  She reports that was previously in the ED for dizziness & SVT. She reports that pt is c/o similar sx and her HR is increasing. Her BP is also creeping up and she would like to get ahead of her sx this time to avoid the hospital. Pt has been on metoprolol 75 mg QD x 2 years. She takes lasix QOD - she does not weigh daily and does not c/o SOB. She would like to know if pt needs to take her lasix daily. Pt is also on lisinopril 40 mg daily, Imdur 60 mg daily & doxazosin 4 mg daily.  Advised her that I will make Dr. Rockey Situ aware of pt's readings and call her back w/ his recommendation.

## 2016-05-02 NOTE — ED Notes (Signed)
Imaging at bedside.

## 2016-05-02 NOTE — Consult Note (Signed)
[facility] NAME: [name]    MR#:  [id number]  DATE OF BIRTH:  [birth year]  DATE OF ADMISSION:  05/02/2016  PRIMARY CARE PHYSICIAN: [name], MD   CONSULT REQUESTING/REFERRING PHYSICIAN: Dr. Corky Downs  REASON FOR CONSULT: Weakness and dizziness  CHIEF COMPLAINT:   Chief Complaint  Patient presents with  . Weakness    HISTORY OF PRESENT ILLNESS:  [name]  is a 83 y.o. female with a known history of Mod-Severe AS, HTN, Chronic Dizziness, weakness, SVT here with dizziness and weakness. This has been going on for 2 months now With her admission for enterococcus bacteremia. She has also had episodes of shortness of breath which resolves on its own. Dizziness is mainly in the morning when she stands up. She is not been eating or drinking well. Is on Lasix. No fever. Normal WBC. Occasional palpitations. Heart rate in the 90s when checked by family. Blood pressure normal. She was seen for similar complaints recently in the emergency room by Dr. Maricela Bo started on low-dose Ativan for anxiety.  PAST MEDICAL HISTORY:   Past Medical History:  Diagnosis Date  . Anemia   . Bone disease   . Carotid arterial disease ([place])    a. 08/2014 U/S: Bilateral < 50% stneosis. Patent vertebrals w/ antegrade flow.   . Cervical spine arthritis ([place])   . Chronic diastolic CHF (congestive heart failure) ([place])    a. echo 2014: EF 55-60%, DD;  b. 08/2014 Echo: EF 55-60%, no RWMA, GR1DD; c. 02/2016 Echo: EF 65-70%, Gr1 DD.  Marland Kitchen Cognitive impairment   . Depression   . Diabetes mellitus without complication ([place])   . Essential hypertension   . GERD (gastroesophageal reflux disease)   . Glaucoma   . Gout   . Hiatal hernia   . History of renal impairment   . Hyperlipidemia   . Hypotension    a. Related to Norvasc - 11/2014 ED visit.  . Multinodular goiter    a. Noted incidentally on CT 07/2012 and carotid U/S 08/2014;  b. Nl TSH 11/2014.  Marland Kitchen  Paroxysmal SVT (supraventricular tachycardia) (HCC)    a. on Toprol   . Prolapsed uterus   . Severe aortic stenosis    a. echo 08/2014: EF 55-60%, no RWMA, GR1DD, severe AS, mild AI, Mean gradient (S): 38 mm Hg. Valve area (VTI): 0.78 cm^2., mild-mod MR, LA mildly dilated, atrial septal aneurysm, mild TR;  b. 02/2016 Echo: EF 65-70%, Gr1 DD, sev AS, mild to mod AI, Valve area (VTI): 0.94 cm^2, (Vmax) 0.96 cm^2, (Vmean) 0.93cm^2, mild MR, mildly dil LA, nl RV fxn, PASP 40mmHg.  . Stroke [facility])    a. CT head 07/2014 showed small thalamic infarct  . Vitamin deficiency     PAST SURGICAL HISTOIRY:   Past Surgical History:  Procedure Laterality Date  . TEE WITHOUT CARDIOVERSION N/A 03/31/2016   Procedure: TRANSESOPHAGEAL ECHOCARDIOGRAM (TEE);  Surgeon: Minna Merritts, MD;  Location: [facility] ORS;  Service: Cardiovascular;  Laterality: N/A;  . TUBAL LIGATION      SOCIAL HISTORY:   Social History  Substance Use Topics  . Smoking status: Never Smoker  . Smokeless tobacco: Never Used  . Alcohol use No    FAMILY HISTORY:   Family History  Problem Relation Age of Onset  . Glaucoma Mother   . Diabetes Mother   . Peptic Ulcer Father   . Colitis Father     DRUG ALLERGIES:   Allergies  Allergen Reactions  . Metformin And Related     REVIEW OF SYSTEMS:   ROS  CONSTITUTIONAL: Positive for fatigue EYES: No blurred or double vision.  EARS, NOSE, AND THROAT: No tinnitus or ear pain.  RESPIRATORY: No cough, shortness of breath, wheezing or hemoptysis.  CARDIOVASCULAR: No chest pain, orthopnea, edema. Occasional palpitations GASTROINTESTINAL: No nausea, vomiting, diarrhea or abdominal pain.  GENITOURINARY: No dysuria, hematuria.  ENDOCRINE: No polyuria, nocturia,  HEMATOLOGY: No anemia, easy bruising or bleeding SKIN: No rash or lesion. MUSCULOSKELETAL: Generalized muscle aches. Low back pain NEUROLOGIC: No tingling, numbness, weakness.  PSYCHIATRY: No anxiety or depression.    MEDICATIONS AT HOME:   Prior to Admission medications   Medication Sig Start Date End Date Taking? Authorizing Provider  acetaminophen (TYLENOL) 500 MG tablet Take 500 mg by mouth every 6 (six) hours as needed.   Yes Historical Provider, MD  allopurinol (ZYLOPRIM) 300 MG tablet Take 300 mg by mouth daily.   Yes Historical Provider, MD  brimonidine (ALPHAGAN P) 0.1 % SOLN Place 1 drop into the right eye 2 (two) times daily.   Yes Historical Provider, MD  cholecalciferol (VITAMIN D) 1000 UNITS tablet Take 2,000 Units by mouth daily.    Yes Historical Provider, MD  Dorzolamide HCl-Timolol Mal (COSOPT OP) Place 1 drop into the right eye 2 (two) times daily.    Yes Historical Provider, MD  furosemide (LASIX) 20 MG tablet Take 1 tablet (20 mg total) by mouth as needed for edema. Patient taking differently: Take 20 mg by mouth every other day.  04/19/16 05/19/16 Yes Minna Merritts, MD  gabapentin (NEURONTIN) 300 MG capsule Take 300 mg by mouth 2 (two) times daily.   Yes Historical Provider, MD  glimepiride (AMARYL) 1 MG tablet Take 1 mg by mouth daily with breakfast.   Yes Historical Provider, MD  iron polysaccharides (NIFEREX) 150 MG capsule Take 150 mg by mouth 2 (two) times daily.   Yes Historical Provider, MD  isosorbide mononitrate (IMDUR) 60 MG 24 hr tablet Take 1 tablet (60 mg total) by mouth 2 (two) times daily. 12/31/15 12/30/16 Yes Minna Merritts, MD  latanoprost (XALATAN) 0.005 % ophthalmic solution Place 1 drop into the right eye at bedtime. 07/03/14  Yes Historical Provider, MD  lidocaine (LIDODERM) 5 % Place 1 patch onto the skin daily. Remove & Discard patch within 12 hours or as directed by MD 04/01/16  Yes Fritzi Mandes, MD  lisinopril (PRINIVIL,ZESTRIL) 40 MG tablet Take 40 mg by mouth at bedtime.   Yes Historical Provider, MD  LORazepam (ATIVAN) 1 MG tablet Take 1 tablet (1 mg total) by mouth 2 (two) times daily. 04/25/16 04/25/17 Yes Earleen Newport, MD  lovastatin (MEVACOR) 40 MG  tablet Take 40 mg by mouth at bedtime.   Yes Historical Provider, MD  metoprolol succinate (TOPROL-XL) 25 MG 24 hr tablet Take 3 tablets (75 mg total) by mouth daily. Take with or immediately following a meal. 09/12/14  Yes Vaughan Basta, MD  pantoprazole (PROTONIX) 40 MG tablet Take 40 mg by mouth 2 (two) times daily.   Yes Historical Provider, MD  pilocarpine (PILOCAR) 4 % ophthalmic solution Place 1 drop into the right eye 4 (four) times daily.    Yes Historical Provider, MD  potassium chloride (K-DUR) 10 MEQ tablet Take 1 tablet (10 mEq total) by mouth daily. Take with lasix Patient taking differently: Take 10 mEq by mouth every other day. Take with lasix 03/23/16 05/02/16 Yes Minna Merritts, MD  tiZANidine (ZANAFLEX) 4 MG tablet Take 1 tablet by mouth 3 (three) times daily as needed. 08/08/14  Yes Historical Provider, MD  traMADol (ULTRAM) 50 MG tablet Take 1 tablet (50 mg total) by mouth every 6 (six) hours as needed for moderate pain. 04/01/16  Yes Fritzi Mandes, MD  amoxicillin (AMOXIL) 500 MG capsule Take 500 mg by mouth 2 (two) times daily.    Historical Provider, MD  aspirin 81 MG tablet Take 81 mg by mouth daily.    Historical Provider, MD  doxazosin (CARDURA) 4 MG tablet Take 1 tablet (4 mg total) by mouth daily. 04/19/16   Minna Merritts, MD  feeding supplement, ENSURE ENLIVE, (ENSURE ENLIVE) LIQD Take 237 mLs by mouth 2 (two) times daily between meals. 04/01/16   Fritzi Mandes, MD  fluconazole (DIFLUCAN) 150 MG tablet Take 150 mg by mouth daily.    Historical Provider, MD  hydrALAZINE (APRESOLINE) 25 MG tablet Take 1 tablet (25 mg total) by mouth 3 (three) times daily as needed. 04/19/16   Minna Merritts, MD      VITAL SIGNS:  Blood pressure (!) 156/61, pulse 83, temperature 97.7 F (36.5 C), temperature source Oral, resp. rate (!) 21, height 5' (1.524 m), weight 65.8 kg (145 lb), SpO2 100 %.  PHYSICAL EXAMINATION:  GENERAL:  83 y.o.-year-old patient lying in the bed with no acute  distress.  EYES: Pupils equal, round, reactive to light and accommodation. No scleral icterus. Extraocular muscles intact.  HEENT: Head atraumatic, normocephalic. Oropharynx and nasopharynx clear.  NECK:  Supple, no jugular venous distention. No thyroid enlargement, no tenderness.  LUNGS: Normal breath sounds bilaterally, no wheezing, rales,rhonchi or crepitation. No use of accessory muscles of respiration.  CARDIOVASCULAR: S1, S2 normal. No murmurs, rubs, or gallops.  ABDOMEN: Soft, nontender, nondistended. Bowel sounds present. No organomegaly or mass.  EXTREMITIES: No pedal edema, cyanosis, or clubbing.  NEUROLOGIC: Cranial nerves II through XII are intact. Muscle strength 4/5 in all extremities. Sensation intact. Gait not checked.  PSYCHIATRIC: The patient is alert and oriented x 3.  SKIN: No obvious rash, lesion, or ulcer.   LABORATORY PANEL:   CBC  Recent Labs Lab 05/02/16 1054  WBC 7.9  HGB 11.5*  HCT 35.2  PLT 198   ------------------------------------------------------------------------------------------------------------------  Chemistries   Recent Labs Lab 05/02/16 1054  NA 135  K 4.3  CL 102  CO2 24  GLUCOSE 337*  BUN 15  CREATININE 0.71  CALCIUM 9.5   ------------------------------------------------------------------------------------------------------------------  Cardiac Enzymes  Recent Labs Lab 05/02/16 1054  TROPONINI 0.03*   ------------------------------------------------------------------------------------------------------------------  RADIOLOGY:  Dg Chest Portable 1 View  Result Date: 05/02/2016 CLINICAL DATA:  Weakness and dizziness for 2 months. EXAM: PORTABLE CHEST 1 VIEW COMPARISON:  Single-view of the chest 04/25/2016. PA and lateral chest 04/12/2016. FINDINGS: There is cardiomegaly without edema. Lungs are clear. No pneumothorax or pleural effusion. Aortic atherosclerosis is noted. There is scoliosis. IMPRESSION: No acute disease.  Atherosclerosis. Scoliosis. Electronically Signed   By: Inge Rise M.D.   On: 05/02/2016 13:30    EKG:   Orders placed or performed during the hospital encounter of 05/02/16  . ED EKG  . ED EKG  . EKG 12-Lead  . EKG 12-Lead    IMPRESSION AND PLAN:   * Dizziness likely due to dehydration and patient's aortic stenosis No renal failure. Advised holding next dose of Lasix and increasing oral fluid intake. This has been a chronic problem.  * Palpitations likely due to SVT. Heart rate has  been only in the 90s. Could be anxiety has diagnosed by EGD recently. Advised to take half a pill of Ativan when this happens. Follow-up with Dr. Rockey Situ of cardiology as outpatient. Toprol.  * Hypertension Well-controlled. Recently had some episodes of hypotension and her Cardura has been stopped. Blood pressure improved.  * Moderate to severe aortic stenosis Cardiology follow-up as outpatient  Patient has no abnormal labs at this time. Blood pressure and heart rate are normal. Lungs sound clear. No signs of infection. Patient is being discharged home from emergency room. Discussed with Dr. Corky Downs of ER.  All the records are reviewed and case discussed with Consulting provider. Management plans discussed with the patient, family and they are in agreement.  CODE STATUS: Full code  TOTAL TIME TAKING CARE OF THIS PATIENT: 35 minutes.    Hillary Bow R M.D on 05/02/2016 at 4:24 PM  Between 7am to 6pm - Pager - [phone]  After 6pm go to [email or web address] - password [email or web address] [facility] [place] [facility]  Office  [phone]  CC: Primary care Physician: [name], MD     Note: This dictation was prepared with Dragon dictation along with smaller phrase technology. Any transcriptional errors that result from this process are unintentional.

## 2016-05-02 NOTE — Telephone Encounter (Signed)
Pt currently in [facility] ED.

## 2016-05-02 NOTE — ED Provider Notes (Signed)
[facility] Emergency Department Provider Note   ____________________________________________    I have reviewed the triage vital signs and the nursing notes.   HISTORY  Chief Complaint Weakness     HPI [name] is a 83 y.o. female who presents with weakness and dizziness. Patient has a history of aortic stenosis and daughter's report she has been taken off of her blood pressure medications. She has been seen in the ED for similar episodestwice in the last 2 months. Admitted at the end of January for enterococcal sepsis, since then has not improved significantly per daughters. No fevers or chills. No cough. No dysuria   Past Medical History:  Diagnosis Date  . Anemia   . Bone disease   . Carotid arterial disease ([place])    a. 08/2014 U/S: Bilateral < 50% stneosis. Patent vertebrals w/ antegrade flow.   . Cervical spine arthritis ([place])   . Chronic diastolic CHF (congestive heart failure) ([place])    a. echo 2014: EF 55-60%, DD;  b. 08/2014 Echo: EF 55-60%, no RWMA, GR1DD; c. 02/2016 Echo: EF 65-70%, Gr1 DD.  Marland Kitchen Cognitive impairment   . Depression   . Diabetes mellitus without complication ([place])   . Essential hypertension   . GERD (gastroesophageal reflux disease)   . Glaucoma   . Gout   . Hiatal hernia   . History of renal impairment   . Hyperlipidemia   . Hypotension    a. Related to Norvasc - 11/2014 ED visit.  . Multinodular goiter    a. Noted incidentally on CT 07/2012 and carotid U/S 08/2014;  b. Nl TSH 11/2014.  Marland Kitchen Paroxysmal SVT (supraventricular tachycardia) (H[facility])    a. on Toprol   . Prolapsed uterus   . Severe aortic stenosis    a. echo 08/2014: EF 55-60%, no RWMA, GR1DD, severe AS, mild AI, Mean gradient (S): 38 mm Hg. Valve area (VTI): 0.78 cm^2., mild-mod MR, LA mildly dilated, atrial septal aneurysm, mild TR;  b. 02/2016 Echo: EF 65-70%, Gr1 DD, sev AS, mild to mod AI, Valve area (VTI): 0.94 cm^2, (Vmax) 0.96 cm^2, (Vmean) 0.93cm^2,  mild MR, mildly dil LA, nl RV fxn, PASP 66mmHg.  . Stroke [facility])    a. CT head 07/2014 showed small thalamic infarct  . Vitamin deficiency     Patient Active Problem List   Diagnosis Date Noted  . Enterococcal bacteremia 03/30/2016  . Viral upper respiratory tract infection   . Acute back pain   . Sepsis ([place])   . Elevated troponin   . CAP (community acquired pneumonia) 03/28/2016  . Leg swelling 09/14/2015  . Essential hypertension   . Hyperlipidemia   . Diabetes mellitus without complication ([place])   . Paroxysmal SVT (supraventricular tachycardia) ([place])   . Multinodular goiter   . Carotid arterial disease ([place])   . Hypotension   . Severe aortic valve stenosis   . NSTEMI (non-ST elevated myocardial infarction) ([place]) 09/10/2014  . Renal failure 08/28/2014  . SVT (supraventricular tachycardia) ([place]) 12/17/2012  . Diabetes mellitus, type 2 ([place]) 12/17/2012  . SOB (shortness of breath) 12/17/2012  . Essential hypertension, malignant 12/17/2012  . Chronic diastolic CHF (congestive heart failure) ([place]) 12/17/2012    Past Surgical History:  Procedure Laterality Date  . TEE WITHOUT CARDIOVERSION N/A 03/31/2016   Procedure: TRANSESOPHAGEAL ECHOCARDIOGRAM (TEE);  Surgeon: Minna Merritts, MD;  Location: [facility] ORS;  Service: Cardiovascular;  Laterality: N/A;  . TUBAL LIGATION      Prior to  Admission medications   Medication Sig Start Date End Date Taking? Authorizing Provider  acetaminophen (TYLENOL) 500 MG tablet Take 500 mg by mouth every 6 (six) hours as needed.   Yes Historical Provider, MD  allopurinol (ZYLOPRIM) 300 MG tablet Take 300 mg by mouth daily.   Yes Historical Provider, MD  brimonidine (ALPHAGAN P) 0.1 % SOLN Place 1 drop into the right eye 2 (two) times daily.   Yes Historical Provider, MD  cholecalciferol (VITAMIN D) 1000 UNITS tablet Take 2,000 Units by mouth daily.    Yes Historical Provider, MD  Dorzolamide HCl-Timolol Mal (COSOPT OP) Place 1 drop into the right eye  2 (two) times daily.    Yes Historical Provider, MD  furosemide (LASIX) 20 MG tablet Take 1 tablet (20 mg total) by mouth as needed for edema. Patient taking differently: Take 20 mg by mouth every other day.  04/19/16 05/19/16 Yes Minna Merritts, MD  gabapentin (NEURONTIN) 300 MG capsule Take 300 mg by mouth 2 (two) times daily.   Yes Historical Provider, MD  glimepiride (AMARYL) 1 MG tablet Take 1 mg by mouth daily with breakfast.   Yes Historical Provider, MD  iron polysaccharides (NIFEREX) 150 MG capsule Take 150 mg by mouth 2 (two) times daily.   Yes Historical Provider, MD  isosorbide mononitrate (IMDUR) 60 MG 24 hr tablet Take 1 tablet (60 mg total) by mouth 2 (two) times daily. 12/31/15 12/30/16 Yes Minna Merritts, MD  latanoprost (XALATAN) 0.005 % ophthalmic solution Place 1 drop into the right eye at bedtime. 07/03/14  Yes Historical Provider, MD  lidocaine (LIDODERM) 5 % Place 1 patch onto the skin daily. Remove & Discard patch within 12 hours or as directed by MD 04/01/16  Yes Fritzi Mandes, MD  lisinopril (PRINIVIL,ZESTRIL) 40 MG tablet Take 40 mg by mouth at bedtime.   Yes Historical Provider, MD  LORazepam (ATIVAN) 1 MG tablet Take 1 tablet (1 mg total) by mouth 2 (two) times daily. 04/25/16 04/25/17 Yes Earleen Newport, MD  lovastatin (MEVACOR) 40 MG tablet Take 40 mg by mouth at bedtime.   Yes Historical Provider, MD  metoprolol succinate (TOPROL-XL) 25 MG 24 hr tablet Take 3 tablets (75 mg total) by mouth daily. Take with or immediately following a meal. 09/12/14  Yes Vaughan Basta, MD  pantoprazole (PROTONIX) 40 MG tablet Take 40 mg by mouth 2 (two) times daily.   Yes Historical Provider, MD  pilocarpine (PILOCAR) 4 % ophthalmic solution Place 1 drop into the right eye 4 (four) times daily.    Yes Historical Provider, MD  potassium chloride (K-DUR) 10 MEQ tablet Take 1 tablet (10 mEq total) by mouth daily. Take with lasix Patient taking differently: Take 10 mEq by mouth every  other day. Take with lasix 03/23/16 05/02/16 Yes Minna Merritts, MD  tiZANidine (ZANAFLEX) 4 MG tablet Take 1 tablet by mouth 3 (three) times daily as needed. 08/08/14  Yes Historical Provider, MD  traMADol (ULTRAM) 50 MG tablet Take 1 tablet (50 mg total) by mouth every 6 (six) hours as needed for moderate pain. 04/01/16  Yes Fritzi Mandes, MD  amoxicillin (AMOXIL) 500 MG capsule Take 500 mg by mouth 2 (two) times daily.    Historical Provider, MD  aspirin 81 MG tablet Take 81 mg by mouth daily.    Historical Provider, MD  doxazosin (CARDURA) 4 MG tablet Take 1 tablet (4 mg total) by mouth daily. 04/19/16   Minna Merritts, MD  feeding supplement, ENSURE  ENLIVE, (ENSURE ENLIVE) LIQD Take 237 mLs by mouth 2 (two) times daily between meals. 04/01/16   Fritzi Mandes, MD  fluconazole (DIFLUCAN) 150 MG tablet Take 150 mg by mouth daily.    Historical Provider, MD  hydrALAZINE (APRESOLINE) 25 MG tablet Take 1 tablet (25 mg total) by mouth 3 (three) times daily as needed. 04/19/16   Minna Merritts, MD     Allergies Metformin and related  Family History  Problem Relation Age of Onset  . Glaucoma Mother   . Diabetes Mother   . Peptic Ulcer Father   . Colitis Father     Social History Social History  Substance Use Topics  . Smoking status: Never Smoker  . Smokeless tobacco: Never Used  . Alcohol use No    Review of Systems  Constitutional: No fever/chills   Cardiovascular: Denies chest pain.Positive dizziness Respiratory: Denies shortness of breath. Gastrointestinal: No abdominal pain.  No nausea, no vomiting.   Genitourinary: Negative for dysuria.  Skin: Negative for rash. Neurological: Positive for diffuse weakness  10-point ROS otherwise negative.  ____________________________________________   PHYSICAL EXAM:  VITAL SIGNS: ED Triage Vitals  Enc Vitals Group     BP 05/02/16 1038 (!) 160/52     Pulse Rate 05/02/16 1038 93     Resp 05/02/16 1038 15     Temp 05/02/16 1038 97.7 F  (36.5 C)     Temp Source 05/02/16 1038 Oral     SpO2 05/02/16 1038 95 %     Weight 05/02/16 1034 145 lb (65.8 kg)     Height 05/02/16 1034 5' (1.524 m)     Head Circumference --      Peak Flow --      Pain Score --      Pain Loc --      Pain Edu? --      Excl. in [place]? --     Constitutional: Alert and oriented. Frail-appearing Eyes: Conjunctivae are normal.   Nose: No congestion/rhinnorhea. Mouth/Throat: Mucous membranes are dry  Cardiovascular: Normal rate, regular rhythm. Grossly normal heart sounds.  Good peripheral circulation. Respiratory: Normal respiratory effort.  No retractions. Lungs CTAB. Gastrointestinal: Soft and nontender. No distention.  No CVA tenderness. Genitourinary: deferred Musculoskeletal: No lower extremity tenderness nor edema.  Warm and well perfused Neurologic:  Normal speech and language. No gross focal neurologic deficits are appreciated.  Skin:  Skin is warm, dry and intact. No rash noted. Psychiatric: Mood and affect are normal. Speech and behavior are normal.  ____________________________________________   LABS (all labs ordered are listed, but only abnormal results are displayed)  Labs Reviewed  BASIC METABOLIC PANEL - Abnormal; Notable for the following:       Result Value   Glucose, Bld 337 (*)    All other components within normal limits  CBC - Abnormal; Notable for the following:    Hemoglobin 11.5 (*)    RDW 17.9 (*)    All other components within normal limits  URINALYSIS, COMPLETE (UACMP) WITH MICROSCOPIC - Abnormal; Notable for the following:    Color, Urine YELLOW (*)    APPearance CLOUDY (*)    Glucose, UA >=500 (*)    Ketones, ur 20 (*)    Squamous Epithelial / LPF 6-30 (*)    All other components within normal limits  TROPONIN I - Abnormal; Notable for the following:    Troponin I 0.03 (*)    All other components within normal limits  CBG MONITORING, ED  ____________________________________________  EKG  ED ECG  REPORT I, Lavonia Drafts, the attending physician, personally viewed and interpreted this ECG.  Date: 05/02/2016  Rate: 91 Rhythm: normal sinus rhythm QRS Axis: normal Intervals: normal ST/T Wave abnormalities: normal Conduction Disturbances: none Narrative Interpretation: unremarkable  ____________________________________________  RADIOLOGY  Chest x-ray unremarkable ____________________________________________   PROCEDURES  Procedure(s) performed: No    Critical Care performed: No ____________________________________________   INITIAL IMPRESSION / ASSESSMENT AND PLAN / ED COURSE  Pertinent labs & imaging results that were available during my care of the patient were reviewed by me and considered in my medical decision making (see chart for details).  Patient presents with dizziness and weakness. It is difficult to determine whether this is related to aortic stenosis or if this is related to gradual decline. We'll consult internal medicine to  for admission  ----------------------------------------- 2:54 PM on 05/02/2016 -----------------------------------------  Patient seen by Dr. Darvin Neighbours, He feels she is appropriate for discharge and the family has agreed with plan    ____________________________________________   FINAL CLINICAL IMPRESSION(S) / ED DIAGNOSES  Final diagnoses:  Weakness  Dehydration      NEW MEDICATIONS STARTED DURING THIS VISIT:  New Prescriptions   No medications on file     Note:  This document was prepared using Dragon voice recognition software and may include unintentional dictation errors.    Lavonia Drafts, MD 05/02/16 1455

## 2016-05-02 NOTE — ED Triage Notes (Signed)
Pt was here 1 week ago for general weakness and dizziness. Sx has been present X 2 months. Reports has not gotten better. Lives at home with family; wheelchair to get around.

## 2016-05-02 NOTE — Telephone Encounter (Signed)
Pt c/o BP issue: STAT if pt c/o blurred vision, one-sided weakness or slurred speech  1. What are your last 5 BP readings? 04/30/16 1 pm 151/65 p 90, 05/01/16 8:24 pm 139/57 p 85, 05/02/16 8 am 184/68 p 95  2. Are you having any other symptoms (ex. Dizziness, headache, blurred vision, passed out)? Dizziness and lightheaded  3. What is your BP issue? Worried about her pulse high

## 2016-05-02 NOTE — ED Notes (Signed)
Patient placed on bedpan. Urine specimen obtained. Patient given oral swabs and warm blanket for comfort.

## 2016-05-02 NOTE — Progress Notes (Signed)
Here in ED for Dizziness, episodic SOB, weakness. Of 2 months.  Seems dehydrated and has Mod-severe AS.  Advised holding next lasix dose and increasing PO fluid intake.  Use ativan half dose for possible anxiety  Explained to patient and family weakness is multi factorial including age, recent bacteremia, AS.  No abnormalities on labs or vitals.  Full consult to follow. Discussed with Dr. Corky Downs

## 2016-05-03 ENCOUNTER — Encounter: Payer: Self-pay | Admitting: Cardiovascular Disease

## 2016-05-03 ENCOUNTER — Ambulatory Visit (INDEPENDENT_AMBULATORY_CARE_PROVIDER_SITE_OTHER): Payer: Medicare HMO | Admitting: Cardiovascular Disease

## 2016-05-03 ENCOUNTER — Telehealth: Payer: Self-pay | Admitting: Cardiovascular Disease

## 2016-05-03 VITALS — BP 148/56 | HR 90 | Ht 68.0 in | Wt 140.5 lb

## 2016-05-03 DIAGNOSIS — R0602 Shortness of breath: Secondary | ICD-10-CM

## 2016-05-03 DIAGNOSIS — I350 Nonrheumatic aortic (valve) stenosis: Secondary | ICD-10-CM

## 2016-05-03 DIAGNOSIS — E119 Type 2 diabetes mellitus without complications: Secondary | ICD-10-CM | POA: Diagnosis not present

## 2016-05-03 DIAGNOSIS — M545 Low back pain: Secondary | ICD-10-CM | POA: Diagnosis not present

## 2016-05-03 DIAGNOSIS — Z515 Encounter for palliative care: Secondary | ICD-10-CM

## 2016-05-03 DIAGNOSIS — I10 Essential (primary) hypertension: Secondary | ICD-10-CM

## 2016-05-03 DIAGNOSIS — I471 Supraventricular tachycardia, unspecified: Secondary | ICD-10-CM

## 2016-05-03 DIAGNOSIS — R5381 Other malaise: Secondary | ICD-10-CM

## 2016-05-03 DIAGNOSIS — I5032 Chronic diastolic (congestive) heart failure: Secondary | ICD-10-CM

## 2016-05-03 NOTE — Telephone Encounter (Signed)
Ivor Costa Gilley 16 minutes ago (10:51 AM)     Pt daughter states pt did go to the ED yesterday. Pt states she is still feeling lightheaded and a rapid HR. Daughter is following up from her phone call yesterday. Please call.

## 2016-05-03 NOTE — Telephone Encounter (Signed)
Printed for Dr. Donivan Scull review.

## 2016-05-03 NOTE — Telephone Encounter (Signed)
Please see previous note.

## 2016-05-03 NOTE — Progress Notes (Signed)
Cardiology Office Note  Date:  05/03/2016   ID:  [name], DOB [birth year], MRN [id number]  PCP:  [name], MD   Chief Complaint  Patient presents with  . other    Weakness, lightheadedness, chest discomfort.  Pt was in ED yesterday, dx w/ dehydration.    HPI:  [name] is a pleasant 83 year old woman with a history of diabetes, hypertension, SVT,Moderate aortic valve stenosis with hospital admission July 2016 for SVT after she developed numerous bug bites with associated pain. She presents for follow-up of her arrhythmia, hypertension, elevated glucose levels, general malaise and anorexia/weight loss  Numerous trips to the emergency room over the past month or 2 Was in the emergency room last night for general malaise Daughters present with her today, reports that their mother was asking for help, did not feel right, was not very specific. Daughters do not no what to do as mother keeps asking for assistance In the emergency room glucose level 330, glucose in the urine Otherwise all other labs looked within normal range and she was discharged home  We contacted primary care, discussed case with Dr. Sherry Ruffing She expressed that there are numerous issues that were very challenging to manage including chronic back pain, labile hypertension, among other issues She expressed that hospice may be a appropriate indication at this time  Further discussion with the daughters, they do feel somewhat helpless, mother continues to ask for help, calling EMTs, no specific complaints just general malaise  On today's visit she reports that she does not feel well, not very specific Not eating as much Drop in her albumin, total protein level over the past several months Hemoglobin A1c 6.8 one month ago Periodic glucose level since that time elevated in the 200s, more recently in the 300 yesterday  On Lasix every other day, improvement of her shortness of breath and leg  swelling Denies any significant chest pain concerning for angina. Denies having any tachycardia concerning for arrhythmia  Previous hospitalization end of January with discharge 04/01/2016 She had sepsis, enterococcus , had TEE done in the hospital to rule out endocarditis Moderate aortic valve stenosis noted  Hematocrit improved over the last month  emergency room 04/12/16  fatigue, shortness breath Felt it was from anemia, deconditioning. Was not admitted to the hospital  Family chose to bring her home, did not go to skilled nursing facility  Daughter concerned as blood pressures running low She has been holding doses of hydralazine, still running low HCTZ previously held for renal dysfunction  hospital admission July 2016,She reports that she had a rash at the time. Turns out she went walking in her yard, developed insect bites all of her legs and abdomen (chiger bits). She was in a tremendous amount of discomfort with severe itching. Likely secondary to that, she developed arrhythmia. she converted back to normal sinus rhythm in the hospital. At the time of discharge her metoprolol was increased from 50 up to 75 mg daily. She is felt well with no further episodes of tachycardia. She currently lives at home, independent  admitted to the hospital 09/20/2012 discharged on 09/25/2012 for Klebsiella UTI, shortness breath, tachycardia, SVT. Arrival to the hospital, she was very weak and was unable to swallow. She was felt to be very dehydrated. Her potassium was repleted, she was started on metoprolol and Cardizem.  Echocardiogram in the hospital 09/19/2012 showed ejection fraction 01-02%, diastolic dysfunction, mild LVH, moderate aortic valve stenosis, normal RV function and size  CT scan of  the neck showed enlargement of the thyroid with multinodular appearance, atherosclerotic disease, right pleural effusion Lab work in the hospital 09/11/2012 showing creatinine 1.57, BUN  44 Followup blood work showed creatinine 1.07, BUN 17 on July 23  PMH:   has a past medical history of Anemia; Bone disease; Carotid arterial disease ([place]); Cervical spine arthritis ([place]); Chronic diastolic CHF (congestive heart failure) ([place]); Cognitive impairment; Depression; Diabetes mellitus without complication ([place]); Essential hypertension; GERD (gastroesophageal reflux disease); Glaucoma; Gout; Hiatal hernia; History of renal impairment; Hyperlipidemia; Hypotension; Multinodular goiter; Paroxysmal SVT (supraventricular tachycardia) ([place]); Prolapsed uterus; Severe aortic stenosis; Stroke [facility]); and Vitamin deficiency.  PSH:    Past Surgical History:  Procedure Laterality Date  . TEE WITHOUT CARDIOVERSION N/A 03/31/2016   Procedure: TRANSESOPHAGEAL ECHOCARDIOGRAM (TEE);  Surgeon: Minna Merritts, MD;  Location: [facility] ORS;  Service: Cardiovascular;  Laterality: N/A;  . TUBAL LIGATION      Current Outpatient Prescriptions  Medication Sig Dispense Refill  . acetaminophen (TYLENOL) 500 MG tablet Take 500 mg by mouth every 6 (six) hours as needed.    Marland Kitchen allopurinol (ZYLOPRIM) 300 MG tablet Take 300 mg by mouth daily.    Marland Kitchen amoxicillin (AMOXIL) 500 MG capsule Take 500 mg by mouth 2 (two) times daily.    Marland Kitchen aspirin 81 MG tablet Take 81 mg by mouth daily.    . brimonidine (ALPHAGAN P) 0.1 % SOLN Place 1 drop into the right eye 2 (two) times daily.    . cholecalciferol (VITAMIN D) 1000 UNITS tablet Take 2,000 Units by mouth daily.     . Dorzolamide HCl-Timolol Mal (COSOPT OP) Place 1 drop into the right eye 2 (two) times daily.     Marland Kitchen doxazosin (CARDURA) 4 MG tablet Take 1 tablet (4 mg total) by mouth daily. 30 tablet 11  . feeding supplement, ENSURE ENLIVE, (ENSURE ENLIVE) LIQD Take 237 mLs by mouth 2 (two) times daily between meals. 237 mL 12  . fluconazole (DIFLUCAN) 150 MG tablet Take 150 mg by mouth daily.    . furosemide (LASIX) 20 MG tablet Take 1 tablet (20 mg total) by mouth as needed for  edema. (Patient taking differently: Take 20 mg by mouth every other day. ) 30 tablet 6  . gabapentin (NEURONTIN) 300 MG capsule Take 300 mg by mouth 2 (two) times daily.    Marland Kitchen glimepiride (AMARYL) 1 MG tablet Take 1 mg by mouth daily with breakfast.    . hydrALAZINE (APRESOLINE) 25 MG tablet Take 1 tablet (25 mg total) by mouth 3 (three) times daily as needed. 90 tablet 6  . iron polysaccharides (NIFEREX) 150 MG capsule Take 150 mg by mouth 2 (two) times daily.    . isosorbide mononitrate (IMDUR) 60 MG 24 hr tablet Take 1 tablet (60 mg total) by mouth 2 (two) times daily. 60 tablet 6  . latanoprost (XALATAN) 0.005 % ophthalmic solution Place 1 drop into the right eye at bedtime.  3  . lidocaine (LIDODERM) 5 % Place 1 patch onto the skin daily. Remove & Discard patch within 12 hours or as directed by MD 15 patch 0  . lisinopril (PRINIVIL,ZESTRIL) 40 MG tablet Take 40 mg by mouth at bedtime.    Marland Kitchen LORazepam (ATIVAN) 1 MG tablet Take 1 tablet (1 mg total) by mouth 2 (two) times daily. 20 tablet 0  . lovastatin (MEVACOR) 40 MG tablet Take 40 mg by mouth at bedtime.    . metoprolol succinate (TOPROL-XL) 25 MG 24 hr tablet Take 3 tablets (  75 mg total) by mouth daily. Take with or immediately following a meal. 120 tablet 0  . pantoprazole (PROTONIX) 40 MG tablet Take 40 mg by mouth 2 (two) times daily.    . pilocarpine (PILOCAR) 4 % ophthalmic solution Place 1 drop into the right eye 4 (four) times daily.     . potassium chloride (K-DUR) 10 MEQ tablet Take 1 tablet (10 mEq total) by mouth daily. Take with lasix (Patient taking differently: Take 10 mEq by mouth every other day. Take with lasix) 30 tablet 6  . tiZANidine (ZANAFLEX) 4 MG tablet Take 1 tablet by mouth 3 (three) times daily as needed.  1  . traMADol (ULTRAM) 50 MG tablet Take 1 tablet (50 mg total) by mouth every 6 (six) hours as needed for moderate pain. 30 tablet 0   No current facility-administered medications for this visit.       Allergies:   Metformin and related   Social History:  The patient  reports that she has never smoked. She has never used smokeless tobacco. She reports that she does not drink alcohol or use drugs.   Family History:   family history includes Colitis in her father; Diabetes in her mother; Glaucoma in her mother; Peptic Ulcer in her father.    Review of Systems: Review of Systems  Constitutional: Positive for malaise/fatigue.       General malaise  Respiratory: Negative.   Cardiovascular: Negative.   Gastrointestinal: Negative.   Musculoskeletal: Negative.        Leg weakness  Neurological: Positive for weakness.  Psychiatric/Behavioral: Negative.   All other systems reviewed and are negative.    PHYSICAL EXAM: VS:  BP (!) 148/56   Pulse 90   Ht 5\' 8"  (1.727 m)   Wt 140 lb 8 oz (63.7 kg)   BMI 21.36 kg/m  , BMI Body mass index is 21.36 kg/m. GEN: Well nourished, well developed, in no acute distress , presents in a wheelchair, appears weak, had bent over, minimally conversant HEENT: normal  Neck: no JVD, carotid bruits, or masses Cardiac: RRR; no murmurs, rubs, or gallops,no edema  Respiratory:  clear to auscultation bilaterally, normal work of breathing GI: soft, nontender, nondistended, + BS MS: no deformity or atrophy  Skin: warm and dry, no rash Neuro:  Strength and sensation are intact Psych: euthymic mood, full affect    Recent Labs: 03/27/2016: ALT 20 03/28/2016: TSH 0.681 05/02/2016: BUN 15; Creatinine, Ser 0.71; Hemoglobin 11.5; Platelets 198; Potassium 4.3; Sodium 135    Lipid Panel Lab Results  Component Value Date   CHOL 114 09/11/2014   HDL 40 (L) 09/11/2014   LDLCALC 55 09/11/2014   TRIG 97 09/11/2014      Wt Readings from Last 3 Encounters:  05/03/16 140 lb 8 oz (63.7 kg)  05/02/16 145 lb (65.8 kg)  04/25/16 146 lb (66.2 kg)       ASSESSMENT AND PLAN:  Encounter for end of life care Discussed case with primary care on the phone  with the patient presents today Long discussion with patient and 2 daughters Given numerous medical issues, failure to thrive, anorexia, chronic pain issues, recommended today discuss hospice consultation with primary care. Primary care has indicated she would be in support of home hospice.  SVT (supraventricular tachycardia) (HCC) Denies having any significant arrhythmia No changes to her medications, we'll continue metoprolol 75 mg daily  Essential hypertension, malignant Blood pressure is well controlled on today's visit. No changes made to the medications.  Chronic diastolic CHF (congestive heart failure) (HCC) Stable at this time, normal renal function, does not appear dehydrated. No fluid overload grossly on exam  Aortic valve stenosis, moderate Aortic valve stenosis is not critical, moderate by TEE  SOB (shortness of breath) Shortness of breath significantly improved on Lasix every other day She does not have a high fluid intake  Diabetes mellitus without complication ([place]) Reported controlled sugars recently, suggested that perhaps home nursing could check sugars and give insulin with meals Unclear if medication changes need to be made to current diabetes medication  Malaise Etiology unclear, nothing specific. Patient continues to request EMT transportation to the hospital but everything checks out okay and she is sent back home  Acute midline low back pain, with sciatica presence unspecified Chronic back pain, likely will require pain medication from hospice  Disposition:   F/U  6 months   Total encounter time more than 45 minutes  Greater than 50% was spent in counseling and coordination of care with the patient   No orders of the defined types were placed in this encounter.    Signed, Esmond Plants, M.D., Ph.D. 05/03/2016  [place], [place]

## 2016-05-03 NOTE — Telephone Encounter (Signed)
Pt added on today to see Dr. Rockey Situ @ 2:20.

## 2016-05-03 NOTE — Patient Instructions (Signed)

## 2016-05-03 NOTE — Telephone Encounter (Signed)
Pt daughter states pt did go to the ED yesterday. Pt states she is still feeling lightheaded and a rapid HR. Daughter is following up from her phone call yesterday. Please call.

## 2016-05-05 DIAGNOSIS — E785 Hyperlipidemia, unspecified: Secondary | ICD-10-CM | POA: Diagnosis not present

## 2016-05-05 DIAGNOSIS — D649 Anemia, unspecified: Secondary | ICD-10-CM | POA: Diagnosis not present

## 2016-05-05 DIAGNOSIS — L89620 Pressure ulcer of left heel, unstageable: Secondary | ICD-10-CM | POA: Diagnosis not present

## 2016-05-05 DIAGNOSIS — M810 Age-related osteoporosis without current pathological fracture: Secondary | ICD-10-CM | POA: Diagnosis not present

## 2016-05-05 DIAGNOSIS — S90411D Abrasion, right great toe, subsequent encounter: Secondary | ICD-10-CM | POA: Diagnosis not present

## 2016-05-05 DIAGNOSIS — M1990 Unspecified osteoarthritis, unspecified site: Secondary | ICD-10-CM | POA: Diagnosis not present

## 2016-05-05 DIAGNOSIS — M47817 Spondylosis without myelopathy or radiculopathy, lumbosacral region: Secondary | ICD-10-CM | POA: Diagnosis not present

## 2016-05-05 DIAGNOSIS — E1122 Type 2 diabetes mellitus with diabetic chronic kidney disease: Secondary | ICD-10-CM | POA: Diagnosis not present

## 2016-05-05 DIAGNOSIS — I471 Supraventricular tachycardia: Secondary | ICD-10-CM | POA: Diagnosis not present

## 2016-05-05 DIAGNOSIS — E1142 Type 2 diabetes mellitus with diabetic polyneuropathy: Secondary | ICD-10-CM | POA: Diagnosis not present

## 2016-05-05 DIAGNOSIS — M1000 Idiopathic gout, unspecified site: Secondary | ICD-10-CM | POA: Diagnosis not present

## 2016-05-05 DIAGNOSIS — I350 Nonrheumatic aortic (valve) stenosis: Secondary | ICD-10-CM | POA: Diagnosis not present

## 2016-05-05 DIAGNOSIS — K625 Hemorrhage of anus and rectum: Secondary | ICD-10-CM | POA: Diagnosis not present

## 2016-05-05 DIAGNOSIS — N189 Chronic kidney disease, unspecified: Secondary | ICD-10-CM | POA: Diagnosis not present

## 2016-05-05 DIAGNOSIS — I129 Hypertensive chronic kidney disease with stage 1 through stage 4 chronic kidney disease, or unspecified chronic kidney disease: Secondary | ICD-10-CM | POA: Diagnosis not present

## 2016-05-05 DIAGNOSIS — M47812 Spondylosis without myelopathy or radiculopathy, cervical region: Secondary | ICD-10-CM | POA: Diagnosis not present

## 2016-05-06 DIAGNOSIS — I350 Nonrheumatic aortic (valve) stenosis: Secondary | ICD-10-CM | POA: Diagnosis not present

## 2016-05-06 DIAGNOSIS — N189 Chronic kidney disease, unspecified: Secondary | ICD-10-CM | POA: Diagnosis not present

## 2016-05-06 DIAGNOSIS — E1122 Type 2 diabetes mellitus with diabetic chronic kidney disease: Secondary | ICD-10-CM | POA: Diagnosis not present

## 2016-05-06 DIAGNOSIS — E1142 Type 2 diabetes mellitus with diabetic polyneuropathy: Secondary | ICD-10-CM | POA: Diagnosis not present

## 2016-05-06 DIAGNOSIS — I129 Hypertensive chronic kidney disease with stage 1 through stage 4 chronic kidney disease, or unspecified chronic kidney disease: Secondary | ICD-10-CM | POA: Diagnosis not present

## 2016-05-06 DIAGNOSIS — I471 Supraventricular tachycardia: Secondary | ICD-10-CM | POA: Diagnosis not present

## 2016-05-07 DIAGNOSIS — I471 Supraventricular tachycardia: Secondary | ICD-10-CM | POA: Diagnosis not present

## 2016-05-07 DIAGNOSIS — N189 Chronic kidney disease, unspecified: Secondary | ICD-10-CM | POA: Diagnosis not present

## 2016-05-07 DIAGNOSIS — E1122 Type 2 diabetes mellitus with diabetic chronic kidney disease: Secondary | ICD-10-CM | POA: Diagnosis not present

## 2016-05-07 DIAGNOSIS — I129 Hypertensive chronic kidney disease with stage 1 through stage 4 chronic kidney disease, or unspecified chronic kidney disease: Secondary | ICD-10-CM | POA: Diagnosis not present

## 2016-05-07 DIAGNOSIS — E1142 Type 2 diabetes mellitus with diabetic polyneuropathy: Secondary | ICD-10-CM | POA: Diagnosis not present

## 2016-05-07 DIAGNOSIS — I350 Nonrheumatic aortic (valve) stenosis: Secondary | ICD-10-CM | POA: Diagnosis not present

## 2016-05-09 DIAGNOSIS — I350 Nonrheumatic aortic (valve) stenosis: Secondary | ICD-10-CM | POA: Diagnosis not present

## 2016-05-09 DIAGNOSIS — I471 Supraventricular tachycardia: Secondary | ICD-10-CM | POA: Diagnosis not present

## 2016-05-09 DIAGNOSIS — E1142 Type 2 diabetes mellitus with diabetic polyneuropathy: Secondary | ICD-10-CM | POA: Diagnosis not present

## 2016-05-09 DIAGNOSIS — E1122 Type 2 diabetes mellitus with diabetic chronic kidney disease: Secondary | ICD-10-CM | POA: Diagnosis not present

## 2016-05-09 DIAGNOSIS — I129 Hypertensive chronic kidney disease with stage 1 through stage 4 chronic kidney disease, or unspecified chronic kidney disease: Secondary | ICD-10-CM | POA: Diagnosis not present

## 2016-05-09 DIAGNOSIS — N189 Chronic kidney disease, unspecified: Secondary | ICD-10-CM | POA: Diagnosis not present

## 2016-05-10 DIAGNOSIS — E1142 Type 2 diabetes mellitus with diabetic polyneuropathy: Secondary | ICD-10-CM | POA: Diagnosis not present

## 2016-05-10 DIAGNOSIS — N189 Chronic kidney disease, unspecified: Secondary | ICD-10-CM | POA: Diagnosis not present

## 2016-05-10 DIAGNOSIS — E1122 Type 2 diabetes mellitus with diabetic chronic kidney disease: Secondary | ICD-10-CM | POA: Diagnosis not present

## 2016-05-10 DIAGNOSIS — I350 Nonrheumatic aortic (valve) stenosis: Secondary | ICD-10-CM | POA: Diagnosis not present

## 2016-05-10 DIAGNOSIS — I129 Hypertensive chronic kidney disease with stage 1 through stage 4 chronic kidney disease, or unspecified chronic kidney disease: Secondary | ICD-10-CM | POA: Diagnosis not present

## 2016-05-10 DIAGNOSIS — I471 Supraventricular tachycardia: Secondary | ICD-10-CM | POA: Diagnosis not present

## 2016-05-11 DIAGNOSIS — I129 Hypertensive chronic kidney disease with stage 1 through stage 4 chronic kidney disease, or unspecified chronic kidney disease: Secondary | ICD-10-CM | POA: Diagnosis not present

## 2016-05-11 DIAGNOSIS — I350 Nonrheumatic aortic (valve) stenosis: Secondary | ICD-10-CM | POA: Diagnosis not present

## 2016-05-11 DIAGNOSIS — I471 Supraventricular tachycardia: Secondary | ICD-10-CM | POA: Diagnosis not present

## 2016-05-11 DIAGNOSIS — E1142 Type 2 diabetes mellitus with diabetic polyneuropathy: Secondary | ICD-10-CM | POA: Diagnosis not present

## 2016-05-11 DIAGNOSIS — E1122 Type 2 diabetes mellitus with diabetic chronic kidney disease: Secondary | ICD-10-CM | POA: Diagnosis not present

## 2016-05-11 DIAGNOSIS — N189 Chronic kidney disease, unspecified: Secondary | ICD-10-CM | POA: Diagnosis not present

## 2016-05-12 DIAGNOSIS — I129 Hypertensive chronic kidney disease with stage 1 through stage 4 chronic kidney disease, or unspecified chronic kidney disease: Secondary | ICD-10-CM | POA: Diagnosis not present

## 2016-05-12 DIAGNOSIS — N189 Chronic kidney disease, unspecified: Secondary | ICD-10-CM | POA: Diagnosis not present

## 2016-05-12 DIAGNOSIS — E1142 Type 2 diabetes mellitus with diabetic polyneuropathy: Secondary | ICD-10-CM | POA: Diagnosis not present

## 2016-05-12 DIAGNOSIS — I471 Supraventricular tachycardia: Secondary | ICD-10-CM | POA: Diagnosis not present

## 2016-05-12 DIAGNOSIS — I350 Nonrheumatic aortic (valve) stenosis: Secondary | ICD-10-CM | POA: Diagnosis not present

## 2016-05-12 DIAGNOSIS — E1122 Type 2 diabetes mellitus with diabetic chronic kidney disease: Secondary | ICD-10-CM | POA: Diagnosis not present

## 2016-05-13 DIAGNOSIS — E1122 Type 2 diabetes mellitus with diabetic chronic kidney disease: Secondary | ICD-10-CM | POA: Diagnosis not present

## 2016-05-13 DIAGNOSIS — N189 Chronic kidney disease, unspecified: Secondary | ICD-10-CM | POA: Diagnosis not present

## 2016-05-13 DIAGNOSIS — I350 Nonrheumatic aortic (valve) stenosis: Secondary | ICD-10-CM | POA: Diagnosis not present

## 2016-05-13 DIAGNOSIS — I129 Hypertensive chronic kidney disease with stage 1 through stage 4 chronic kidney disease, or unspecified chronic kidney disease: Secondary | ICD-10-CM | POA: Diagnosis not present

## 2016-05-13 DIAGNOSIS — I471 Supraventricular tachycardia: Secondary | ICD-10-CM | POA: Diagnosis not present

## 2016-05-13 DIAGNOSIS — E1142 Type 2 diabetes mellitus with diabetic polyneuropathy: Secondary | ICD-10-CM | POA: Diagnosis not present

## 2016-05-16 DIAGNOSIS — N189 Chronic kidney disease, unspecified: Secondary | ICD-10-CM | POA: Diagnosis not present

## 2016-05-16 DIAGNOSIS — I471 Supraventricular tachycardia: Secondary | ICD-10-CM | POA: Diagnosis not present

## 2016-05-16 DIAGNOSIS — I350 Nonrheumatic aortic (valve) stenosis: Secondary | ICD-10-CM | POA: Diagnosis not present

## 2016-05-16 DIAGNOSIS — E1122 Type 2 diabetes mellitus with diabetic chronic kidney disease: Secondary | ICD-10-CM | POA: Diagnosis not present

## 2016-05-16 DIAGNOSIS — E1142 Type 2 diabetes mellitus with diabetic polyneuropathy: Secondary | ICD-10-CM | POA: Diagnosis not present

## 2016-05-16 DIAGNOSIS — I129 Hypertensive chronic kidney disease with stage 1 through stage 4 chronic kidney disease, or unspecified chronic kidney disease: Secondary | ICD-10-CM | POA: Diagnosis not present

## 2016-05-17 DIAGNOSIS — E1142 Type 2 diabetes mellitus with diabetic polyneuropathy: Secondary | ICD-10-CM | POA: Diagnosis not present

## 2016-05-17 DIAGNOSIS — I350 Nonrheumatic aortic (valve) stenosis: Secondary | ICD-10-CM | POA: Diagnosis not present

## 2016-05-17 DIAGNOSIS — N189 Chronic kidney disease, unspecified: Secondary | ICD-10-CM | POA: Diagnosis not present

## 2016-05-17 DIAGNOSIS — I471 Supraventricular tachycardia: Secondary | ICD-10-CM | POA: Diagnosis not present

## 2016-05-17 DIAGNOSIS — E1122 Type 2 diabetes mellitus with diabetic chronic kidney disease: Secondary | ICD-10-CM | POA: Diagnosis not present

## 2016-05-17 DIAGNOSIS — I129 Hypertensive chronic kidney disease with stage 1 through stage 4 chronic kidney disease, or unspecified chronic kidney disease: Secondary | ICD-10-CM | POA: Diagnosis not present

## 2016-05-18 DIAGNOSIS — E1122 Type 2 diabetes mellitus with diabetic chronic kidney disease: Secondary | ICD-10-CM | POA: Diagnosis not present

## 2016-05-18 DIAGNOSIS — E1142 Type 2 diabetes mellitus with diabetic polyneuropathy: Secondary | ICD-10-CM | POA: Diagnosis not present

## 2016-05-18 DIAGNOSIS — N189 Chronic kidney disease, unspecified: Secondary | ICD-10-CM | POA: Diagnosis not present

## 2016-05-18 DIAGNOSIS — I129 Hypertensive chronic kidney disease with stage 1 through stage 4 chronic kidney disease, or unspecified chronic kidney disease: Secondary | ICD-10-CM | POA: Diagnosis not present

## 2016-05-18 DIAGNOSIS — I471 Supraventricular tachycardia: Secondary | ICD-10-CM | POA: Diagnosis not present

## 2016-05-18 DIAGNOSIS — I350 Nonrheumatic aortic (valve) stenosis: Secondary | ICD-10-CM | POA: Diagnosis not present

## 2016-05-19 DIAGNOSIS — I471 Supraventricular tachycardia: Secondary | ICD-10-CM | POA: Diagnosis not present

## 2016-05-19 DIAGNOSIS — E1142 Type 2 diabetes mellitus with diabetic polyneuropathy: Secondary | ICD-10-CM | POA: Diagnosis not present

## 2016-05-19 DIAGNOSIS — E1122 Type 2 diabetes mellitus with diabetic chronic kidney disease: Secondary | ICD-10-CM | POA: Diagnosis not present

## 2016-05-19 DIAGNOSIS — I129 Hypertensive chronic kidney disease with stage 1 through stage 4 chronic kidney disease, or unspecified chronic kidney disease: Secondary | ICD-10-CM | POA: Diagnosis not present

## 2016-05-19 DIAGNOSIS — N189 Chronic kidney disease, unspecified: Secondary | ICD-10-CM | POA: Diagnosis not present

## 2016-05-19 DIAGNOSIS — I350 Nonrheumatic aortic (valve) stenosis: Secondary | ICD-10-CM | POA: Diagnosis not present

## 2016-05-20 DIAGNOSIS — E1122 Type 2 diabetes mellitus with diabetic chronic kidney disease: Secondary | ICD-10-CM | POA: Diagnosis not present

## 2016-05-20 DIAGNOSIS — I350 Nonrheumatic aortic (valve) stenosis: Secondary | ICD-10-CM | POA: Diagnosis not present

## 2016-05-20 DIAGNOSIS — I471 Supraventricular tachycardia: Secondary | ICD-10-CM | POA: Diagnosis not present

## 2016-05-20 DIAGNOSIS — I129 Hypertensive chronic kidney disease with stage 1 through stage 4 chronic kidney disease, or unspecified chronic kidney disease: Secondary | ICD-10-CM | POA: Diagnosis not present

## 2016-05-20 DIAGNOSIS — N189 Chronic kidney disease, unspecified: Secondary | ICD-10-CM | POA: Diagnosis not present

## 2016-05-20 DIAGNOSIS — E1142 Type 2 diabetes mellitus with diabetic polyneuropathy: Secondary | ICD-10-CM | POA: Diagnosis not present

## 2016-05-23 DIAGNOSIS — I129 Hypertensive chronic kidney disease with stage 1 through stage 4 chronic kidney disease, or unspecified chronic kidney disease: Secondary | ICD-10-CM | POA: Diagnosis not present

## 2016-05-23 DIAGNOSIS — I350 Nonrheumatic aortic (valve) stenosis: Secondary | ICD-10-CM | POA: Diagnosis not present

## 2016-05-23 DIAGNOSIS — I471 Supraventricular tachycardia: Secondary | ICD-10-CM | POA: Diagnosis not present

## 2016-05-23 DIAGNOSIS — E1142 Type 2 diabetes mellitus with diabetic polyneuropathy: Secondary | ICD-10-CM | POA: Diagnosis not present

## 2016-05-23 DIAGNOSIS — N189 Chronic kidney disease, unspecified: Secondary | ICD-10-CM | POA: Diagnosis not present

## 2016-05-23 DIAGNOSIS — E1122 Type 2 diabetes mellitus with diabetic chronic kidney disease: Secondary | ICD-10-CM | POA: Diagnosis not present

## 2016-05-24 DIAGNOSIS — I129 Hypertensive chronic kidney disease with stage 1 through stage 4 chronic kidney disease, or unspecified chronic kidney disease: Secondary | ICD-10-CM | POA: Diagnosis not present

## 2016-05-24 DIAGNOSIS — I350 Nonrheumatic aortic (valve) stenosis: Secondary | ICD-10-CM | POA: Diagnosis not present

## 2016-05-24 DIAGNOSIS — E1142 Type 2 diabetes mellitus with diabetic polyneuropathy: Secondary | ICD-10-CM | POA: Diagnosis not present

## 2016-05-24 DIAGNOSIS — N189 Chronic kidney disease, unspecified: Secondary | ICD-10-CM | POA: Diagnosis not present

## 2016-05-24 DIAGNOSIS — I471 Supraventricular tachycardia: Secondary | ICD-10-CM | POA: Diagnosis not present

## 2016-05-24 DIAGNOSIS — E1122 Type 2 diabetes mellitus with diabetic chronic kidney disease: Secondary | ICD-10-CM | POA: Diagnosis not present

## 2016-05-25 DIAGNOSIS — E1142 Type 2 diabetes mellitus with diabetic polyneuropathy: Secondary | ICD-10-CM | POA: Diagnosis not present

## 2016-05-25 DIAGNOSIS — N189 Chronic kidney disease, unspecified: Secondary | ICD-10-CM | POA: Diagnosis not present

## 2016-05-25 DIAGNOSIS — E1122 Type 2 diabetes mellitus with diabetic chronic kidney disease: Secondary | ICD-10-CM | POA: Diagnosis not present

## 2016-05-25 DIAGNOSIS — I129 Hypertensive chronic kidney disease with stage 1 through stage 4 chronic kidney disease, or unspecified chronic kidney disease: Secondary | ICD-10-CM | POA: Diagnosis not present

## 2016-05-25 DIAGNOSIS — I471 Supraventricular tachycardia: Secondary | ICD-10-CM | POA: Diagnosis not present

## 2016-05-25 DIAGNOSIS — I350 Nonrheumatic aortic (valve) stenosis: Secondary | ICD-10-CM | POA: Diagnosis not present

## 2016-05-29 DIAGNOSIS — D649 Anemia, unspecified: Secondary | ICD-10-CM | POA: Diagnosis not present

## 2016-05-29 DIAGNOSIS — M47817 Spondylosis without myelopathy or radiculopathy, lumbosacral region: Secondary | ICD-10-CM | POA: Diagnosis not present

## 2016-05-29 DIAGNOSIS — M1000 Idiopathic gout, unspecified site: Secondary | ICD-10-CM | POA: Diagnosis not present

## 2016-05-29 DIAGNOSIS — S90411D Abrasion, right great toe, subsequent encounter: Secondary | ICD-10-CM | POA: Diagnosis not present

## 2016-05-29 DIAGNOSIS — I471 Supraventricular tachycardia: Secondary | ICD-10-CM | POA: Diagnosis not present

## 2016-05-29 DIAGNOSIS — M47812 Spondylosis without myelopathy or radiculopathy, cervical region: Secondary | ICD-10-CM | POA: Diagnosis not present

## 2016-05-29 DIAGNOSIS — E785 Hyperlipidemia, unspecified: Secondary | ICD-10-CM | POA: Diagnosis not present

## 2016-05-29 DIAGNOSIS — L89620 Pressure ulcer of left heel, unstageable: Secondary | ICD-10-CM | POA: Diagnosis not present

## 2016-05-29 DIAGNOSIS — E1122 Type 2 diabetes mellitus with diabetic chronic kidney disease: Secondary | ICD-10-CM | POA: Diagnosis not present

## 2016-05-29 DIAGNOSIS — M1990 Unspecified osteoarthritis, unspecified site: Secondary | ICD-10-CM | POA: Diagnosis not present

## 2016-05-29 DIAGNOSIS — N189 Chronic kidney disease, unspecified: Secondary | ICD-10-CM | POA: Diagnosis not present

## 2016-05-29 DIAGNOSIS — K625 Hemorrhage of anus and rectum: Secondary | ICD-10-CM | POA: Diagnosis not present

## 2016-05-29 DIAGNOSIS — E1142 Type 2 diabetes mellitus with diabetic polyneuropathy: Secondary | ICD-10-CM | POA: Diagnosis not present

## 2016-05-29 DIAGNOSIS — I129 Hypertensive chronic kidney disease with stage 1 through stage 4 chronic kidney disease, or unspecified chronic kidney disease: Secondary | ICD-10-CM | POA: Diagnosis not present

## 2016-05-29 DIAGNOSIS — I350 Nonrheumatic aortic (valve) stenosis: Secondary | ICD-10-CM | POA: Diagnosis not present

## 2016-05-29 DIAGNOSIS — M810 Age-related osteoporosis without current pathological fracture: Secondary | ICD-10-CM | POA: Diagnosis not present

## 2016-05-30 DIAGNOSIS — I471 Supraventricular tachycardia: Secondary | ICD-10-CM | POA: Diagnosis not present

## 2016-05-30 DIAGNOSIS — E1122 Type 2 diabetes mellitus with diabetic chronic kidney disease: Secondary | ICD-10-CM | POA: Diagnosis not present

## 2016-05-30 DIAGNOSIS — N189 Chronic kidney disease, unspecified: Secondary | ICD-10-CM | POA: Diagnosis not present

## 2016-05-30 DIAGNOSIS — I129 Hypertensive chronic kidney disease with stage 1 through stage 4 chronic kidney disease, or unspecified chronic kidney disease: Secondary | ICD-10-CM | POA: Diagnosis not present

## 2016-05-30 DIAGNOSIS — I350 Nonrheumatic aortic (valve) stenosis: Secondary | ICD-10-CM | POA: Diagnosis not present

## 2016-05-30 DIAGNOSIS — E1142 Type 2 diabetes mellitus with diabetic polyneuropathy: Secondary | ICD-10-CM | POA: Diagnosis not present

## 2016-05-31 DIAGNOSIS — I129 Hypertensive chronic kidney disease with stage 1 through stage 4 chronic kidney disease, or unspecified chronic kidney disease: Secondary | ICD-10-CM | POA: Diagnosis not present

## 2016-05-31 DIAGNOSIS — E1142 Type 2 diabetes mellitus with diabetic polyneuropathy: Secondary | ICD-10-CM | POA: Diagnosis not present

## 2016-05-31 DIAGNOSIS — I350 Nonrheumatic aortic (valve) stenosis: Secondary | ICD-10-CM | POA: Diagnosis not present

## 2016-05-31 DIAGNOSIS — I471 Supraventricular tachycardia: Secondary | ICD-10-CM | POA: Diagnosis not present

## 2016-05-31 DIAGNOSIS — E1122 Type 2 diabetes mellitus with diabetic chronic kidney disease: Secondary | ICD-10-CM | POA: Diagnosis not present

## 2016-05-31 DIAGNOSIS — N189 Chronic kidney disease, unspecified: Secondary | ICD-10-CM | POA: Diagnosis not present

## 2016-06-01 DIAGNOSIS — I350 Nonrheumatic aortic (valve) stenosis: Secondary | ICD-10-CM | POA: Diagnosis not present

## 2016-06-01 DIAGNOSIS — E1122 Type 2 diabetes mellitus with diabetic chronic kidney disease: Secondary | ICD-10-CM | POA: Diagnosis not present

## 2016-06-01 DIAGNOSIS — I471 Supraventricular tachycardia: Secondary | ICD-10-CM | POA: Diagnosis not present

## 2016-06-01 DIAGNOSIS — N189 Chronic kidney disease, unspecified: Secondary | ICD-10-CM | POA: Diagnosis not present

## 2016-06-01 DIAGNOSIS — I129 Hypertensive chronic kidney disease with stage 1 through stage 4 chronic kidney disease, or unspecified chronic kidney disease: Secondary | ICD-10-CM | POA: Diagnosis not present

## 2016-06-01 DIAGNOSIS — E1142 Type 2 diabetes mellitus with diabetic polyneuropathy: Secondary | ICD-10-CM | POA: Diagnosis not present

## 2016-06-03 DIAGNOSIS — I129 Hypertensive chronic kidney disease with stage 1 through stage 4 chronic kidney disease, or unspecified chronic kidney disease: Secondary | ICD-10-CM | POA: Diagnosis not present

## 2016-06-03 DIAGNOSIS — I471 Supraventricular tachycardia: Secondary | ICD-10-CM | POA: Diagnosis not present

## 2016-06-03 DIAGNOSIS — E1142 Type 2 diabetes mellitus with diabetic polyneuropathy: Secondary | ICD-10-CM | POA: Diagnosis not present

## 2016-06-03 DIAGNOSIS — E1122 Type 2 diabetes mellitus with diabetic chronic kidney disease: Secondary | ICD-10-CM | POA: Diagnosis not present

## 2016-06-03 DIAGNOSIS — I350 Nonrheumatic aortic (valve) stenosis: Secondary | ICD-10-CM | POA: Diagnosis not present

## 2016-06-03 DIAGNOSIS — N189 Chronic kidney disease, unspecified: Secondary | ICD-10-CM | POA: Diagnosis not present

## 2016-06-04 DIAGNOSIS — E1122 Type 2 diabetes mellitus with diabetic chronic kidney disease: Secondary | ICD-10-CM | POA: Diagnosis not present

## 2016-06-04 DIAGNOSIS — I129 Hypertensive chronic kidney disease with stage 1 through stage 4 chronic kidney disease, or unspecified chronic kidney disease: Secondary | ICD-10-CM | POA: Diagnosis not present

## 2016-06-04 DIAGNOSIS — N189 Chronic kidney disease, unspecified: Secondary | ICD-10-CM | POA: Diagnosis not present

## 2016-06-04 DIAGNOSIS — I471 Supraventricular tachycardia: Secondary | ICD-10-CM | POA: Diagnosis not present

## 2016-06-04 DIAGNOSIS — E1142 Type 2 diabetes mellitus with diabetic polyneuropathy: Secondary | ICD-10-CM | POA: Diagnosis not present

## 2016-06-04 DIAGNOSIS — I350 Nonrheumatic aortic (valve) stenosis: Secondary | ICD-10-CM | POA: Diagnosis not present

## 2016-06-06 DIAGNOSIS — I471 Supraventricular tachycardia: Secondary | ICD-10-CM | POA: Diagnosis not present

## 2016-06-06 DIAGNOSIS — I350 Nonrheumatic aortic (valve) stenosis: Secondary | ICD-10-CM | POA: Diagnosis not present

## 2016-06-06 DIAGNOSIS — N189 Chronic kidney disease, unspecified: Secondary | ICD-10-CM | POA: Diagnosis not present

## 2016-06-06 DIAGNOSIS — I129 Hypertensive chronic kidney disease with stage 1 through stage 4 chronic kidney disease, or unspecified chronic kidney disease: Secondary | ICD-10-CM | POA: Diagnosis not present

## 2016-06-06 DIAGNOSIS — E1122 Type 2 diabetes mellitus with diabetic chronic kidney disease: Secondary | ICD-10-CM | POA: Diagnosis not present

## 2016-06-06 DIAGNOSIS — E1142 Type 2 diabetes mellitus with diabetic polyneuropathy: Secondary | ICD-10-CM | POA: Diagnosis not present

## 2016-06-07 DIAGNOSIS — I471 Supraventricular tachycardia: Secondary | ICD-10-CM | POA: Diagnosis not present

## 2016-06-07 DIAGNOSIS — I129 Hypertensive chronic kidney disease with stage 1 through stage 4 chronic kidney disease, or unspecified chronic kidney disease: Secondary | ICD-10-CM | POA: Diagnosis not present

## 2016-06-07 DIAGNOSIS — I350 Nonrheumatic aortic (valve) stenosis: Secondary | ICD-10-CM | POA: Diagnosis not present

## 2016-06-07 DIAGNOSIS — N189 Chronic kidney disease, unspecified: Secondary | ICD-10-CM | POA: Diagnosis not present

## 2016-06-07 DIAGNOSIS — E1122 Type 2 diabetes mellitus with diabetic chronic kidney disease: Secondary | ICD-10-CM | POA: Diagnosis not present

## 2016-06-07 DIAGNOSIS — E1142 Type 2 diabetes mellitus with diabetic polyneuropathy: Secondary | ICD-10-CM | POA: Diagnosis not present

## 2016-06-08 DIAGNOSIS — I129 Hypertensive chronic kidney disease with stage 1 through stage 4 chronic kidney disease, or unspecified chronic kidney disease: Secondary | ICD-10-CM | POA: Diagnosis not present

## 2016-06-08 DIAGNOSIS — E1142 Type 2 diabetes mellitus with diabetic polyneuropathy: Secondary | ICD-10-CM | POA: Diagnosis not present

## 2016-06-08 DIAGNOSIS — I471 Supraventricular tachycardia: Secondary | ICD-10-CM | POA: Diagnosis not present

## 2016-06-08 DIAGNOSIS — I350 Nonrheumatic aortic (valve) stenosis: Secondary | ICD-10-CM | POA: Diagnosis not present

## 2016-06-08 DIAGNOSIS — E1122 Type 2 diabetes mellitus with diabetic chronic kidney disease: Secondary | ICD-10-CM | POA: Diagnosis not present

## 2016-06-08 DIAGNOSIS — N189 Chronic kidney disease, unspecified: Secondary | ICD-10-CM | POA: Diagnosis not present

## 2016-06-09 DIAGNOSIS — E1122 Type 2 diabetes mellitus with diabetic chronic kidney disease: Secondary | ICD-10-CM | POA: Diagnosis not present

## 2016-06-09 DIAGNOSIS — N189 Chronic kidney disease, unspecified: Secondary | ICD-10-CM | POA: Diagnosis not present

## 2016-06-09 DIAGNOSIS — I350 Nonrheumatic aortic (valve) stenosis: Secondary | ICD-10-CM | POA: Diagnosis not present

## 2016-06-09 DIAGNOSIS — I471 Supraventricular tachycardia: Secondary | ICD-10-CM | POA: Diagnosis not present

## 2016-06-09 DIAGNOSIS — E1142 Type 2 diabetes mellitus with diabetic polyneuropathy: Secondary | ICD-10-CM | POA: Diagnosis not present

## 2016-06-09 DIAGNOSIS — I129 Hypertensive chronic kidney disease with stage 1 through stage 4 chronic kidney disease, or unspecified chronic kidney disease: Secondary | ICD-10-CM | POA: Diagnosis not present

## 2016-06-10 DIAGNOSIS — N189 Chronic kidney disease, unspecified: Secondary | ICD-10-CM | POA: Diagnosis not present

## 2016-06-10 DIAGNOSIS — E1142 Type 2 diabetes mellitus with diabetic polyneuropathy: Secondary | ICD-10-CM | POA: Diagnosis not present

## 2016-06-10 DIAGNOSIS — I129 Hypertensive chronic kidney disease with stage 1 through stage 4 chronic kidney disease, or unspecified chronic kidney disease: Secondary | ICD-10-CM | POA: Diagnosis not present

## 2016-06-10 DIAGNOSIS — I350 Nonrheumatic aortic (valve) stenosis: Secondary | ICD-10-CM | POA: Diagnosis not present

## 2016-06-10 DIAGNOSIS — E1122 Type 2 diabetes mellitus with diabetic chronic kidney disease: Secondary | ICD-10-CM | POA: Diagnosis not present

## 2016-06-10 DIAGNOSIS — I471 Supraventricular tachycardia: Secondary | ICD-10-CM | POA: Diagnosis not present

## 2016-06-13 ENCOUNTER — Institutional Professional Consult (permissible substitution): Payer: Self-pay | Admitting: Internal Medicine

## 2016-06-13 DIAGNOSIS — I129 Hypertensive chronic kidney disease with stage 1 through stage 4 chronic kidney disease, or unspecified chronic kidney disease: Secondary | ICD-10-CM | POA: Diagnosis not present

## 2016-06-13 DIAGNOSIS — I471 Supraventricular tachycardia: Secondary | ICD-10-CM | POA: Diagnosis not present

## 2016-06-13 DIAGNOSIS — E1142 Type 2 diabetes mellitus with diabetic polyneuropathy: Secondary | ICD-10-CM | POA: Diagnosis not present

## 2016-06-13 DIAGNOSIS — N189 Chronic kidney disease, unspecified: Secondary | ICD-10-CM | POA: Diagnosis not present

## 2016-06-13 DIAGNOSIS — E1122 Type 2 diabetes mellitus with diabetic chronic kidney disease: Secondary | ICD-10-CM | POA: Diagnosis not present

## 2016-06-13 DIAGNOSIS — I350 Nonrheumatic aortic (valve) stenosis: Secondary | ICD-10-CM | POA: Diagnosis not present

## 2016-06-15 DIAGNOSIS — E1142 Type 2 diabetes mellitus with diabetic polyneuropathy: Secondary | ICD-10-CM | POA: Diagnosis not present

## 2016-06-15 DIAGNOSIS — I350 Nonrheumatic aortic (valve) stenosis: Secondary | ICD-10-CM | POA: Diagnosis not present

## 2016-06-15 DIAGNOSIS — I471 Supraventricular tachycardia: Secondary | ICD-10-CM | POA: Diagnosis not present

## 2016-06-15 DIAGNOSIS — I129 Hypertensive chronic kidney disease with stage 1 through stage 4 chronic kidney disease, or unspecified chronic kidney disease: Secondary | ICD-10-CM | POA: Diagnosis not present

## 2016-06-15 DIAGNOSIS — E1122 Type 2 diabetes mellitus with diabetic chronic kidney disease: Secondary | ICD-10-CM | POA: Diagnosis not present

## 2016-06-15 DIAGNOSIS — N189 Chronic kidney disease, unspecified: Secondary | ICD-10-CM | POA: Diagnosis not present

## 2016-06-17 DIAGNOSIS — I350 Nonrheumatic aortic (valve) stenosis: Secondary | ICD-10-CM | POA: Diagnosis not present

## 2016-06-17 DIAGNOSIS — I129 Hypertensive chronic kidney disease with stage 1 through stage 4 chronic kidney disease, or unspecified chronic kidney disease: Secondary | ICD-10-CM | POA: Diagnosis not present

## 2016-06-17 DIAGNOSIS — N189 Chronic kidney disease, unspecified: Secondary | ICD-10-CM | POA: Diagnosis not present

## 2016-06-17 DIAGNOSIS — I471 Supraventricular tachycardia: Secondary | ICD-10-CM | POA: Diagnosis not present

## 2016-06-17 DIAGNOSIS — E1142 Type 2 diabetes mellitus with diabetic polyneuropathy: Secondary | ICD-10-CM | POA: Diagnosis not present

## 2016-06-17 DIAGNOSIS — E1122 Type 2 diabetes mellitus with diabetic chronic kidney disease: Secondary | ICD-10-CM | POA: Diagnosis not present

## 2016-06-20 ENCOUNTER — Emergency Department: Payer: Medicare Other

## 2016-06-20 ENCOUNTER — Emergency Department
Admission: EM | Admit: 2016-06-20 | Discharge: 2016-06-20 | Disposition: A | Discharge status: Home / Self Care | Payer: Medicare Other | Attending: Emergency Medicine | Admitting: Emergency Medicine

## 2016-06-20 DIAGNOSIS — N183 Chronic kidney disease, stage 3 (moderate): Secondary | ICD-10-CM | POA: Insufficient documentation

## 2016-06-20 DIAGNOSIS — E1122 Type 2 diabetes mellitus with diabetic chronic kidney disease: Secondary | ICD-10-CM | POA: Insufficient documentation

## 2016-06-20 DIAGNOSIS — Y939 Activity, unspecified: Secondary | ICD-10-CM | POA: Insufficient documentation

## 2016-06-20 DIAGNOSIS — R600 Localized edema: Secondary | ICD-10-CM | POA: Diagnosis not present

## 2016-06-20 DIAGNOSIS — X58XXXA Exposure to other specified factors, initial encounter: Secondary | ICD-10-CM | POA: Diagnosis not present

## 2016-06-20 DIAGNOSIS — I252 Old myocardial infarction: Secondary | ICD-10-CM | POA: Diagnosis not present

## 2016-06-20 DIAGNOSIS — S62353A Nondisplaced fracture of shaft of third metacarpal bone, left hand, initial encounter for closed fracture: Secondary | ICD-10-CM

## 2016-06-20 DIAGNOSIS — Z79899 Other long term (current) drug therapy: Secondary | ICD-10-CM | POA: Diagnosis not present

## 2016-06-20 DIAGNOSIS — Z794 Long term (current) use of insulin: Secondary | ICD-10-CM | POA: Insufficient documentation

## 2016-06-20 DIAGNOSIS — I110 Hypertensive heart disease with heart failure: Secondary | ICD-10-CM | POA: Insufficient documentation

## 2016-06-20 DIAGNOSIS — Y929 Unspecified place or not applicable: Secondary | ICD-10-CM | POA: Diagnosis not present

## 2016-06-20 DIAGNOSIS — Y999 Unspecified external cause status: Secondary | ICD-10-CM | POA: Insufficient documentation

## 2016-06-20 DIAGNOSIS — I5032 Chronic diastolic (congestive) heart failure: Secondary | ICD-10-CM | POA: Insufficient documentation

## 2016-06-20 DIAGNOSIS — S6992XA Unspecified injury of left wrist, hand and finger(s), initial encounter: Secondary | ICD-10-CM | POA: Diagnosis present

## 2016-06-20 LAB — CBC
HCT: 34.8 % — ABNORMAL LOW (ref 35.0–47.0)
HEMOGLOBIN: 11.3 g/dL — AB (ref 12.0–16.0)
MCH: 29.3 pg (ref 26.0–34.0)
MCHC: 32.5 g/dL (ref 32.0–36.0)
MCV: 90.1 fL (ref 80.0–100.0)
Platelets: 212 10*3/uL (ref 150–440)
RBC: 3.86 MIL/uL (ref 3.80–5.20)
RDW: 17.4 % — ABNORMAL HIGH (ref 11.5–14.5)
WBC: 10.1 10*3/uL (ref 3.6–11.0)

## 2016-06-20 LAB — BASIC METABOLIC PANEL
Anion gap: 8 (ref 5–15)
BUN: 12 mg/dL (ref 6–20)
CHLORIDE: 100 mmol/L — AB (ref 101–111)
CO2: 30 mmol/L (ref 22–32)
CREATININE: 0.62 mg/dL (ref 0.44–1.00)
Calcium: 9.3 mg/dL (ref 8.9–10.3)
Glucose, Bld: 178 mg/dL — ABNORMAL HIGH (ref 65–99)
POTASSIUM: 3.3 mmol/L — AB (ref 3.5–5.1)
SODIUM: 138 mmol/L (ref 135–145)

## 2016-06-20 LAB — URIC ACID: URIC ACID, SERUM: 3 mg/dL (ref 2.3–6.6)

## 2016-06-20 LAB — FIBRIN DERIVATIVES D-DIMER (ARMC ONLY): FIBRIN DERIVATIVES D-DIMER (ARMC): 2495.87 — AB (ref 0.00–499.00)

## 2016-06-20 NOTE — ED Notes (Signed)
AAOx3.  Skin warm and dry.  NAD 

## 2016-06-20 NOTE — ED Notes (Signed)
Patient denies pain and is resting comfortably.  

## 2016-06-20 NOTE — ED Triage Notes (Signed)
Pt states L hand swelling and pain x month. Pt has brace on wrist. Pt has had xray that was negative for fracture. Family member concerned about blood clot. Pt has hx gout. Pt able to wiggle fingers. No distress noted at this time.

## 2016-06-20 NOTE — ED Notes (Signed)
Left hand +2 swelling.  Posterior hand is warm to touch.  Brisk capillary refill. +1 palpable radial pulse.

## 2016-06-20 NOTE — ED Provider Notes (Signed)
[facility] Emergency Department Provider Note  Time seen: 3:17 PM  I have reviewed the triage vital signs and the nursing notes.   HISTORY  Chief Complaint Hand Pain    HPI [name] is a 83 y.o. female with multiple medical issues who presents the emergency department for evaluation of left hand swelling. According to the patient and her daughter for the past one month patient has had mild swelling of the left hand especially the dorsal aspect. Patient states very minimal discomfort. Patient has history of gout however she took colchicine 2 weeks ago for a gout flare in her foot which resolved but she states no change in the left hand swelling. Denies any known trauma. Patient is mostly wheelchair bound at this point she does participate in physical therapy several times a week. Denies any chest pain or shortness of breath. Denies any fever. Denies any recent illnesses cough congestion, headache, visual changes.  Past Medical History:  Diagnosis Date  . Anemia   . Bone disease   . Carotid arterial disease ([place])    a. 08/2014 U/S: Bilateral < 50% stneosis. Patent vertebrals w/ antegrade flow.   . Cervical spine arthritis ([place])   . Chronic diastolic CHF (congestive heart failure) ([place])    a. echo 2014: EF 55-60%, DD;  b. 08/2014 Echo: EF 55-60%, no RWMA, GR1DD; c. 02/2016 Echo: EF 65-70%, Gr1 DD.  Marland Kitchen Cognitive impairment   . Depression   . Diabetes mellitus without complication ([place])   . Essential hypertension   . GERD (gastroesophageal reflux disease)   . Glaucoma   . Gout   . Hiatal hernia   . History of renal impairment   . Hyperlipidemia   . Hypotension    a. Related to Norvasc - 11/2014 ED visit.  . Multinodular goiter    a. Noted incidentally on CT 07/2012 and carotid U/S 08/2014;  b. Nl TSH 11/2014.  Marland Kitchen Paroxysmal SVT (supraventricular tachycardia) (HCC)    a. on Toprol   . Prolapsed uterus   . Severe aortic stenosis    a. echo 08/2014: EF  55-60%, no RWMA, GR1DD, severe AS, mild AI, Mean gradient (S): 38 mm Hg. Valve area (VTI): 0.78 cm^2., mild-mod MR, LA mildly dilated, atrial septal aneurysm, mild TR;  b. 02/2016 Echo: EF 65-70%, Gr1 DD, sev AS, mild to mod AI, Valve area (VTI): 0.94 cm^2, (Vmax) 0.96 cm^2, (Vmean) 0.93cm^2, mild MR, mildly dil LA, nl RV fxn, PASP 78mmHg.  . Stroke [facility])    a. CT head 07/2014 showed small thalamic infarct  . Vitamin deficiency     Patient Active Problem List   Diagnosis Date Noted  . Aortic valve stenosis, moderate 05/03/2016  . Enterococcal bacteremia 03/30/2016  . Viral upper respiratory tract infection   . Acute back pain   . Sepsis ([place])   . Elevated troponin   . CAP (community acquired pneumonia) 03/28/2016  . Leg swelling 09/14/2015  . Essential hypertension   . Hyperlipidemia   . Diabetes mellitus without complication ([place])   . Paroxysmal SVT (supraventricular tachycardia) ([place])   . Multinodular goiter   . Carotid arterial disease ([place])   . Hypotension   . NSTEMI (non-ST elevated myocardial infarction) ([place]) 09/10/2014  . Renal failure 08/28/2014  . SVT (supraventricular tachycardia) ([place]) 12/17/2012  . Diabetes mellitus, type 2 ([place]) 12/17/2012  . SOB (shortness of breath) 12/17/2012  . Essential hypertension, malignant 12/17/2012  . Chronic diastolic CHF (congestive heart failure) ([place]) 12/17/2012  Past Surgical History:  Procedure Laterality Date  . TEE WITHOUT CARDIOVERSION N/A 03/31/2016   Procedure: TRANSESOPHAGEAL ECHOCARDIOGRAM (TEE);  Surgeon: Minna Merritts, MD;  Location: [facility] ORS;  Service: Cardiovascular;  Laterality: N/A;  . TUBAL LIGATION      Prior to Admission medications   Medication Sig Start Date End Date Taking? Authorizing Provider  acetaminophen (TYLENOL) 500 MG tablet Take 500 mg by mouth every 6 (six) hours as needed.    Historical Provider, MD  allopurinol (ZYLOPRIM) 300 MG tablet Take 300 mg by mouth daily.    Historical Provider, MD   amoxicillin (AMOXIL) 500 MG capsule Take 500 mg by mouth 2 (two) times daily.    Historical Provider, MD  aspirin 81 MG tablet Take 81 mg by mouth daily.    Historical Provider, MD  brimonidine (ALPHAGAN P) 0.1 % SOLN Place 1 drop into the right eye 2 (two) times daily.    Historical Provider, MD  cholecalciferol (VITAMIN D) 1000 UNITS tablet Take 2,000 Units by mouth daily.     Historical Provider, MD  Dorzolamide HCl-Timolol Mal (COSOPT OP) Place 1 drop into the right eye 2 (two) times daily.     Historical Provider, MD  doxazosin (CARDURA) 4 MG tablet Take 1 tablet (4 mg total) by mouth daily. 04/19/16   Minna Merritts, MD  feeding supplement, ENSURE ENLIVE, (ENSURE ENLIVE) LIQD Take 237 mLs by mouth 2 (two) times daily between meals. 04/01/16   Fritzi Mandes, MD  fluconazole (DIFLUCAN) 150 MG tablet Take 150 mg by mouth daily.    Historical Provider, MD  furosemide (LASIX) 20 MG tablet Take 1 tablet (20 mg total) by mouth as needed for edema. Patient taking differently: Take 20 mg by mouth every other day.  04/19/16 05/19/16  Minna Merritts, MD  gabapentin (NEURONTIN) 300 MG capsule Take 300 mg by mouth 2 (two) times daily.    Historical Provider, MD  glimepiride (AMARYL) 1 MG tablet Take 1 mg by mouth daily with breakfast.    Historical Provider, MD  hydrALAZINE (APRESOLINE) 25 MG tablet Take 1 tablet (25 mg total) by mouth 3 (three) times daily as needed. 04/19/16   Minna Merritts, MD  iron polysaccharides (NIFEREX) 150 MG capsule Take 150 mg by mouth 2 (two) times daily.    Historical Provider, MD  isosorbide mononitrate (IMDUR) 60 MG 24 hr tablet Take 1 tablet (60 mg total) by mouth 2 (two) times daily. 12/31/15 12/30/16  Minna Merritts, MD  latanoprost (XALATAN) 0.005 % ophthalmic solution Place 1 drop into the right eye at bedtime. 07/03/14   Historical Provider, MD  lidocaine (LIDODERM) 5 % Place 1 patch onto the skin daily. Remove & Discard patch within 12 hours or as directed by MD 04/01/16    Fritzi Mandes, MD  lisinopril (PRINIVIL,ZESTRIL) 40 MG tablet Take 40 mg by mouth at bedtime.    Historical Provider, MD  LORazepam (ATIVAN) 1 MG tablet Take 1 tablet (1 mg total) by mouth 2 (two) times daily. 04/25/16 04/25/17  Earleen Newport, MD  lovastatin (MEVACOR) 40 MG tablet Take 40 mg by mouth at bedtime.    Historical Provider, MD  metoprolol succinate (TOPROL-XL) 25 MG 24 hr tablet Take 3 tablets (75 mg total) by mouth daily. Take with or immediately following a meal. 09/12/14   Vaughan Basta, MD  pantoprazole (PROTONIX) 40 MG tablet Take 40 mg by mouth 2 (two) times daily.    Historical Provider, MD  pilocarpine (PILOCAR) 4 %  ophthalmic solution Place 1 drop into the right eye 4 (four) times daily.     Historical Provider, MD  potassium chloride (K-DUR) 10 MEQ tablet Take 1 tablet (10 mEq total) by mouth daily. Take with lasix Patient taking differently: Take 10 mEq by mouth every other day. Take with lasix 03/23/16 05/02/16  Minna Merritts, MD  tiZANidine (ZANAFLEX) 4 MG tablet Take 1 tablet by mouth 3 (three) times daily as needed. 08/08/14   Historical Provider, MD  traMADol (ULTRAM) 50 MG tablet Take 1 tablet (50 mg total) by mouth every 6 (six) hours as needed for moderate pain. 04/01/16   Fritzi Mandes, MD    Allergies  Allergen Reactions  . Metformin And Related     Family History  Problem Relation Age of Onset  . Glaucoma Mother   . Diabetes Mother   . Peptic Ulcer Father   . Colitis Father     Social History Social History  Substance Use Topics  . Smoking status: Never Smoker  . Smokeless tobacco: Never Used  . Alcohol use No    Review of Systems Constitutional: Negative for fever. Eyes: Negative for visual changes. ENT: Negative for congestion Cardiovascular: Negative for chest pain. Respiratory: Negative for shortness of breath. Gastrointestinal: Negative for abdominal pain Genitourinary: Negative for dysuria. Musculoskeletal: Moderate moderate edema  asleep the left hand and distal forearm. Skin: Negative for rash. Neurological: Negative for headache 10-point ROS otherwise negative.  ____________________________________________   PHYSICAL EXAM:  VITAL SIGNS: ED Triage Vitals  Enc Vitals Group     BP 06/20/16 1234 (!) 163/41     Pulse Rate 06/20/16 1234 87     Resp 06/20/16 1234 18     Temp 06/20/16 1234 98.2 F (36.8 C)     Temp Source 06/20/16 1234 Oral     SpO2 06/20/16 1234 98 %     Weight 06/20/16 1235 138 lb (62.6 kg)     Height 06/20/16 1235 5' (1.524 m)     Head Circumference --      Peak Flow --      Pain Score 06/20/16 1255 5     Pain Loc --      Pain Edu? --      Excl. in [place]? --     Constitutional: Alert and oriented. Well appearing and in no distress. Eyes: Normal exam ENT   Head: Normocephalic and atraumatic.   Mouth/Throat: Mucous membranes are moist. Cardiovascular: Normal rate, regular rhythm. No murmur Respiratory: Normal respiratory effort without tachypnea nor retractions. Breath sounds are clear Gastrointestinal: Soft and nontender. No distention.   Musculoskeletal: Patient has mild tenderness throughout the dorsal aspect of the left hand, no ecchymosis, no focal area of tenderness identified. Patient does have 1+ edema to the left hand especially the dorsal aspect as well as the very distal forearm. Good range of motion in the wrist and in all digits. 2+ radial pulse. Sensation intact. Neurologic:  Normal speech and language. No gross focal neurologic deficits  Skin:  Skin is warm, dry and intact.  Psychiatric: Mood and affect are normal.   ____________________________________________     RADIOLOGY  X-ray shows possible nondisplaced fracture of the third metacarpal. Ultrasound negative for DVT  ____________________________________________   INITIAL IMPRESSION / ASSESSMENT AND PLAN / ED COURSE  Pertinent labs & imaging results that were available during my care of the patient were  reviewed by me and considered in my medical decision making (see chart for details).  X-ray shows  possible nondisplaced fracture of the third metacarpal. Patient does have some tenderness over the area however she also has tenderness across all of her metacarpals and none of which appear to be more so than anywhere else. This could possibly explain the patient's discomfort and swelling. Patient's labs also showed a very elevated d-dimer. We'll obtain an ultrasound left upper extremity to rule out DVT. Denies any leg pain. Denies any chest pain or shortness of breath.  Ultrasound negative for DVT. Uric acid level negative. Given the patient's possible nondisplaced fracture this could be the cause of the patient's swelling and discomfort. We will have the patient follow up with orthopedics for further evaluation if she continues to have discomfort over the next 1-2 weeks. ____________________________________________   FINAL CLINICAL IMPRESSION(S) / ED DIAGNOSES  Left upper extremity edema    Harvest Dark, MD 06/20/16 1623

## 2016-06-20 NOTE — Discharge Instructions (Signed)
Please call the number provided for orthopedics if she continued to have discomfort or swelling in the left hand in the next 1-2 weeks.

## 2016-06-21 DIAGNOSIS — I350 Nonrheumatic aortic (valve) stenosis: Secondary | ICD-10-CM | POA: Diagnosis not present

## 2016-06-21 DIAGNOSIS — E1142 Type 2 diabetes mellitus with diabetic polyneuropathy: Secondary | ICD-10-CM | POA: Diagnosis not present

## 2016-06-21 DIAGNOSIS — E1122 Type 2 diabetes mellitus with diabetic chronic kidney disease: Secondary | ICD-10-CM | POA: Diagnosis not present

## 2016-06-21 DIAGNOSIS — I471 Supraventricular tachycardia: Secondary | ICD-10-CM | POA: Diagnosis not present

## 2016-06-21 DIAGNOSIS — I129 Hypertensive chronic kidney disease with stage 1 through stage 4 chronic kidney disease, or unspecified chronic kidney disease: Secondary | ICD-10-CM | POA: Diagnosis not present

## 2016-06-21 DIAGNOSIS — N189 Chronic kidney disease, unspecified: Secondary | ICD-10-CM | POA: Diagnosis not present

## 2016-06-22 DIAGNOSIS — H401132 Primary open-angle glaucoma, bilateral, moderate stage: Secondary | ICD-10-CM | POA: Diagnosis not present

## 2016-06-22 NOTE — Progress Notes (Signed)
Patient currently followed by Hospice and [name]. ED notes faxed to hospice team. Thank you. Flo Shanks RN, BSN, [place] [facility] [place], [facility]  [phone] c

## 2016-06-23 DIAGNOSIS — I471 Supraventricular tachycardia: Secondary | ICD-10-CM | POA: Diagnosis not present

## 2016-06-23 DIAGNOSIS — I129 Hypertensive chronic kidney disease with stage 1 through stage 4 chronic kidney disease, or unspecified chronic kidney disease: Secondary | ICD-10-CM | POA: Diagnosis not present

## 2016-06-23 DIAGNOSIS — E1122 Type 2 diabetes mellitus with diabetic chronic kidney disease: Secondary | ICD-10-CM | POA: Diagnosis not present

## 2016-06-23 DIAGNOSIS — I350 Nonrheumatic aortic (valve) stenosis: Secondary | ICD-10-CM | POA: Diagnosis not present

## 2016-06-23 DIAGNOSIS — N189 Chronic kidney disease, unspecified: Secondary | ICD-10-CM | POA: Diagnosis not present

## 2016-06-23 DIAGNOSIS — E1142 Type 2 diabetes mellitus with diabetic polyneuropathy: Secondary | ICD-10-CM | POA: Diagnosis not present

## 2016-06-24 DIAGNOSIS — N189 Chronic kidney disease, unspecified: Secondary | ICD-10-CM | POA: Diagnosis not present

## 2016-06-24 DIAGNOSIS — I350 Nonrheumatic aortic (valve) stenosis: Secondary | ICD-10-CM | POA: Diagnosis not present

## 2016-06-24 DIAGNOSIS — E1142 Type 2 diabetes mellitus with diabetic polyneuropathy: Secondary | ICD-10-CM | POA: Diagnosis not present

## 2016-06-24 DIAGNOSIS — I129 Hypertensive chronic kidney disease with stage 1 through stage 4 chronic kidney disease, or unspecified chronic kidney disease: Secondary | ICD-10-CM | POA: Diagnosis not present

## 2016-06-24 DIAGNOSIS — I471 Supraventricular tachycardia: Secondary | ICD-10-CM | POA: Diagnosis not present

## 2016-06-24 DIAGNOSIS — E1122 Type 2 diabetes mellitus with diabetic chronic kidney disease: Secondary | ICD-10-CM | POA: Diagnosis not present

## 2016-06-25 DIAGNOSIS — I471 Supraventricular tachycardia: Secondary | ICD-10-CM | POA: Diagnosis not present

## 2016-06-25 DIAGNOSIS — E1122 Type 2 diabetes mellitus with diabetic chronic kidney disease: Secondary | ICD-10-CM | POA: Diagnosis not present

## 2016-06-25 DIAGNOSIS — N189 Chronic kidney disease, unspecified: Secondary | ICD-10-CM | POA: Diagnosis not present

## 2016-06-25 DIAGNOSIS — I350 Nonrheumatic aortic (valve) stenosis: Secondary | ICD-10-CM | POA: Diagnosis not present

## 2016-06-25 DIAGNOSIS — I129 Hypertensive chronic kidney disease with stage 1 through stage 4 chronic kidney disease, or unspecified chronic kidney disease: Secondary | ICD-10-CM | POA: Diagnosis not present

## 2016-06-25 DIAGNOSIS — E1142 Type 2 diabetes mellitus with diabetic polyneuropathy: Secondary | ICD-10-CM | POA: Diagnosis not present

## 2016-06-27 DIAGNOSIS — I471 Supraventricular tachycardia: Secondary | ICD-10-CM | POA: Diagnosis not present

## 2016-06-27 DIAGNOSIS — N189 Chronic kidney disease, unspecified: Secondary | ICD-10-CM | POA: Diagnosis not present

## 2016-06-27 DIAGNOSIS — I350 Nonrheumatic aortic (valve) stenosis: Secondary | ICD-10-CM | POA: Diagnosis not present

## 2016-06-27 DIAGNOSIS — I129 Hypertensive chronic kidney disease with stage 1 through stage 4 chronic kidney disease, or unspecified chronic kidney disease: Secondary | ICD-10-CM | POA: Diagnosis not present

## 2016-06-27 DIAGNOSIS — E1142 Type 2 diabetes mellitus with diabetic polyneuropathy: Secondary | ICD-10-CM | POA: Diagnosis not present

## 2016-06-27 DIAGNOSIS — E1122 Type 2 diabetes mellitus with diabetic chronic kidney disease: Secondary | ICD-10-CM | POA: Diagnosis not present

## 2016-06-28 DIAGNOSIS — M47817 Spondylosis without myelopathy or radiculopathy, lumbosacral region: Secondary | ICD-10-CM | POA: Diagnosis not present

## 2016-06-28 DIAGNOSIS — I129 Hypertensive chronic kidney disease with stage 1 through stage 4 chronic kidney disease, or unspecified chronic kidney disease: Secondary | ICD-10-CM | POA: Diagnosis not present

## 2016-06-28 DIAGNOSIS — E1142 Type 2 diabetes mellitus with diabetic polyneuropathy: Secondary | ICD-10-CM | POA: Diagnosis not present

## 2016-06-28 DIAGNOSIS — S63502A Unspecified sprain of left wrist, initial encounter: Secondary | ICD-10-CM | POA: Diagnosis not present

## 2016-06-28 DIAGNOSIS — I350 Nonrheumatic aortic (valve) stenosis: Secondary | ICD-10-CM | POA: Diagnosis not present

## 2016-06-28 DIAGNOSIS — E785 Hyperlipidemia, unspecified: Secondary | ICD-10-CM | POA: Diagnosis not present

## 2016-06-28 DIAGNOSIS — E1122 Type 2 diabetes mellitus with diabetic chronic kidney disease: Secondary | ICD-10-CM | POA: Diagnosis not present

## 2016-06-28 DIAGNOSIS — M810 Age-related osteoporosis without current pathological fracture: Secondary | ICD-10-CM | POA: Diagnosis not present

## 2016-06-28 DIAGNOSIS — L89620 Pressure ulcer of left heel, unstageable: Secondary | ICD-10-CM | POA: Diagnosis not present

## 2016-06-28 DIAGNOSIS — I471 Supraventricular tachycardia: Secondary | ICD-10-CM | POA: Diagnosis not present

## 2016-06-28 DIAGNOSIS — N189 Chronic kidney disease, unspecified: Secondary | ICD-10-CM | POA: Diagnosis not present

## 2016-06-28 DIAGNOSIS — M1000 Idiopathic gout, unspecified site: Secondary | ICD-10-CM | POA: Diagnosis not present

## 2016-06-28 DIAGNOSIS — M47812 Spondylosis without myelopathy or radiculopathy, cervical region: Secondary | ICD-10-CM | POA: Diagnosis not present

## 2016-06-28 DIAGNOSIS — M1990 Unspecified osteoarthritis, unspecified site: Secondary | ICD-10-CM | POA: Diagnosis not present

## 2016-06-28 DIAGNOSIS — D649 Anemia, unspecified: Secondary | ICD-10-CM | POA: Diagnosis not present

## 2016-06-28 DIAGNOSIS — S90411D Abrasion, right great toe, subsequent encounter: Secondary | ICD-10-CM | POA: Diagnosis not present

## 2016-06-28 DIAGNOSIS — M19032 Primary osteoarthritis, left wrist: Secondary | ICD-10-CM | POA: Diagnosis not present

## 2016-06-28 DIAGNOSIS — K625 Hemorrhage of anus and rectum: Secondary | ICD-10-CM | POA: Diagnosis not present

## 2016-06-29 DIAGNOSIS — I129 Hypertensive chronic kidney disease with stage 1 through stage 4 chronic kidney disease, or unspecified chronic kidney disease: Secondary | ICD-10-CM | POA: Diagnosis not present

## 2016-06-29 DIAGNOSIS — I350 Nonrheumatic aortic (valve) stenosis: Secondary | ICD-10-CM | POA: Diagnosis not present

## 2016-06-29 DIAGNOSIS — E1122 Type 2 diabetes mellitus with diabetic chronic kidney disease: Secondary | ICD-10-CM | POA: Diagnosis not present

## 2016-06-29 DIAGNOSIS — I471 Supraventricular tachycardia: Secondary | ICD-10-CM | POA: Diagnosis not present

## 2016-06-29 DIAGNOSIS — N189 Chronic kidney disease, unspecified: Secondary | ICD-10-CM | POA: Diagnosis not present

## 2016-06-29 DIAGNOSIS — E1142 Type 2 diabetes mellitus with diabetic polyneuropathy: Secondary | ICD-10-CM | POA: Diagnosis not present

## 2016-07-01 DIAGNOSIS — E1142 Type 2 diabetes mellitus with diabetic polyneuropathy: Secondary | ICD-10-CM | POA: Diagnosis not present

## 2016-07-01 DIAGNOSIS — I471 Supraventricular tachycardia: Secondary | ICD-10-CM | POA: Diagnosis not present

## 2016-07-01 DIAGNOSIS — I129 Hypertensive chronic kidney disease with stage 1 through stage 4 chronic kidney disease, or unspecified chronic kidney disease: Secondary | ICD-10-CM | POA: Diagnosis not present

## 2016-07-01 DIAGNOSIS — E1122 Type 2 diabetes mellitus with diabetic chronic kidney disease: Secondary | ICD-10-CM | POA: Diagnosis not present

## 2016-07-01 DIAGNOSIS — N189 Chronic kidney disease, unspecified: Secondary | ICD-10-CM | POA: Diagnosis not present

## 2016-07-01 DIAGNOSIS — I350 Nonrheumatic aortic (valve) stenosis: Secondary | ICD-10-CM | POA: Diagnosis not present

## 2016-07-04 DIAGNOSIS — N189 Chronic kidney disease, unspecified: Secondary | ICD-10-CM | POA: Diagnosis not present

## 2016-07-04 DIAGNOSIS — I350 Nonrheumatic aortic (valve) stenosis: Secondary | ICD-10-CM | POA: Diagnosis not present

## 2016-07-04 DIAGNOSIS — E1142 Type 2 diabetes mellitus with diabetic polyneuropathy: Secondary | ICD-10-CM | POA: Diagnosis not present

## 2016-07-04 DIAGNOSIS — E1122 Type 2 diabetes mellitus with diabetic chronic kidney disease: Secondary | ICD-10-CM | POA: Diagnosis not present

## 2016-07-04 DIAGNOSIS — I471 Supraventricular tachycardia: Secondary | ICD-10-CM | POA: Diagnosis not present

## 2016-07-04 DIAGNOSIS — I129 Hypertensive chronic kidney disease with stage 1 through stage 4 chronic kidney disease, or unspecified chronic kidney disease: Secondary | ICD-10-CM | POA: Diagnosis not present

## 2016-07-06 DIAGNOSIS — I350 Nonrheumatic aortic (valve) stenosis: Secondary | ICD-10-CM | POA: Diagnosis not present

## 2016-07-06 DIAGNOSIS — I471 Supraventricular tachycardia: Secondary | ICD-10-CM | POA: Diagnosis not present

## 2016-07-06 DIAGNOSIS — N189 Chronic kidney disease, unspecified: Secondary | ICD-10-CM | POA: Diagnosis not present

## 2016-07-06 DIAGNOSIS — E1122 Type 2 diabetes mellitus with diabetic chronic kidney disease: Secondary | ICD-10-CM | POA: Diagnosis not present

## 2016-07-06 DIAGNOSIS — E1142 Type 2 diabetes mellitus with diabetic polyneuropathy: Secondary | ICD-10-CM | POA: Diagnosis not present

## 2016-07-06 DIAGNOSIS — I129 Hypertensive chronic kidney disease with stage 1 through stage 4 chronic kidney disease, or unspecified chronic kidney disease: Secondary | ICD-10-CM | POA: Diagnosis not present

## 2016-07-08 DIAGNOSIS — I471 Supraventricular tachycardia: Secondary | ICD-10-CM | POA: Diagnosis not present

## 2016-07-08 DIAGNOSIS — E1142 Type 2 diabetes mellitus with diabetic polyneuropathy: Secondary | ICD-10-CM | POA: Diagnosis not present

## 2016-07-08 DIAGNOSIS — I350 Nonrheumatic aortic (valve) stenosis: Secondary | ICD-10-CM | POA: Diagnosis not present

## 2016-07-08 DIAGNOSIS — I129 Hypertensive chronic kidney disease with stage 1 through stage 4 chronic kidney disease, or unspecified chronic kidney disease: Secondary | ICD-10-CM | POA: Diagnosis not present

## 2016-07-08 DIAGNOSIS — E1122 Type 2 diabetes mellitus with diabetic chronic kidney disease: Secondary | ICD-10-CM | POA: Diagnosis not present

## 2016-07-08 DIAGNOSIS — N189 Chronic kidney disease, unspecified: Secondary | ICD-10-CM | POA: Diagnosis not present

## 2016-07-11 DIAGNOSIS — N189 Chronic kidney disease, unspecified: Secondary | ICD-10-CM | POA: Diagnosis not present

## 2016-07-11 DIAGNOSIS — I350 Nonrheumatic aortic (valve) stenosis: Secondary | ICD-10-CM | POA: Diagnosis not present

## 2016-07-11 DIAGNOSIS — I471 Supraventricular tachycardia: Secondary | ICD-10-CM | POA: Diagnosis not present

## 2016-07-11 DIAGNOSIS — E1142 Type 2 diabetes mellitus with diabetic polyneuropathy: Secondary | ICD-10-CM | POA: Diagnosis not present

## 2016-07-11 DIAGNOSIS — I129 Hypertensive chronic kidney disease with stage 1 through stage 4 chronic kidney disease, or unspecified chronic kidney disease: Secondary | ICD-10-CM | POA: Diagnosis not present

## 2016-07-11 DIAGNOSIS — E1122 Type 2 diabetes mellitus with diabetic chronic kidney disease: Secondary | ICD-10-CM | POA: Diagnosis not present

## 2016-07-13 DIAGNOSIS — E1142 Type 2 diabetes mellitus with diabetic polyneuropathy: Secondary | ICD-10-CM | POA: Diagnosis not present

## 2016-07-13 DIAGNOSIS — I471 Supraventricular tachycardia: Secondary | ICD-10-CM | POA: Diagnosis not present

## 2016-07-13 DIAGNOSIS — I350 Nonrheumatic aortic (valve) stenosis: Secondary | ICD-10-CM | POA: Diagnosis not present

## 2016-07-13 DIAGNOSIS — E1122 Type 2 diabetes mellitus with diabetic chronic kidney disease: Secondary | ICD-10-CM | POA: Diagnosis not present

## 2016-07-13 DIAGNOSIS — N189 Chronic kidney disease, unspecified: Secondary | ICD-10-CM | POA: Diagnosis not present

## 2016-07-13 DIAGNOSIS — I129 Hypertensive chronic kidney disease with stage 1 through stage 4 chronic kidney disease, or unspecified chronic kidney disease: Secondary | ICD-10-CM | POA: Diagnosis not present

## 2016-07-14 ENCOUNTER — Ambulatory Visit: Payer: Self-pay | Admitting: Cardiovascular Disease

## 2016-07-14 DIAGNOSIS — I129 Hypertensive chronic kidney disease with stage 1 through stage 4 chronic kidney disease, or unspecified chronic kidney disease: Secondary | ICD-10-CM | POA: Diagnosis not present

## 2016-07-14 DIAGNOSIS — I350 Nonrheumatic aortic (valve) stenosis: Secondary | ICD-10-CM | POA: Diagnosis not present

## 2016-07-14 DIAGNOSIS — E1142 Type 2 diabetes mellitus with diabetic polyneuropathy: Secondary | ICD-10-CM | POA: Diagnosis not present

## 2016-07-14 DIAGNOSIS — E1122 Type 2 diabetes mellitus with diabetic chronic kidney disease: Secondary | ICD-10-CM | POA: Diagnosis not present

## 2016-07-14 DIAGNOSIS — N189 Chronic kidney disease, unspecified: Secondary | ICD-10-CM | POA: Diagnosis not present

## 2016-07-14 DIAGNOSIS — I471 Supraventricular tachycardia: Secondary | ICD-10-CM | POA: Diagnosis not present

## 2016-07-15 DIAGNOSIS — E1142 Type 2 diabetes mellitus with diabetic polyneuropathy: Secondary | ICD-10-CM | POA: Diagnosis not present

## 2016-07-15 DIAGNOSIS — I129 Hypertensive chronic kidney disease with stage 1 through stage 4 chronic kidney disease, or unspecified chronic kidney disease: Secondary | ICD-10-CM | POA: Diagnosis not present

## 2016-07-15 DIAGNOSIS — N189 Chronic kidney disease, unspecified: Secondary | ICD-10-CM | POA: Diagnosis not present

## 2016-07-15 DIAGNOSIS — E1122 Type 2 diabetes mellitus with diabetic chronic kidney disease: Secondary | ICD-10-CM | POA: Diagnosis not present

## 2016-07-15 DIAGNOSIS — I350 Nonrheumatic aortic (valve) stenosis: Secondary | ICD-10-CM | POA: Diagnosis not present

## 2016-07-15 DIAGNOSIS — I471 Supraventricular tachycardia: Secondary | ICD-10-CM | POA: Diagnosis not present

## 2016-07-18 DIAGNOSIS — I129 Hypertensive chronic kidney disease with stage 1 through stage 4 chronic kidney disease, or unspecified chronic kidney disease: Secondary | ICD-10-CM | POA: Diagnosis not present

## 2016-07-18 DIAGNOSIS — I350 Nonrheumatic aortic (valve) stenosis: Secondary | ICD-10-CM | POA: Diagnosis not present

## 2016-07-18 DIAGNOSIS — N189 Chronic kidney disease, unspecified: Secondary | ICD-10-CM | POA: Diagnosis not present

## 2016-07-18 DIAGNOSIS — E1142 Type 2 diabetes mellitus with diabetic polyneuropathy: Secondary | ICD-10-CM | POA: Diagnosis not present

## 2016-07-18 DIAGNOSIS — E1122 Type 2 diabetes mellitus with diabetic chronic kidney disease: Secondary | ICD-10-CM | POA: Diagnosis not present

## 2016-07-18 DIAGNOSIS — I471 Supraventricular tachycardia: Secondary | ICD-10-CM | POA: Diagnosis not present

## 2016-07-20 DIAGNOSIS — N189 Chronic kidney disease, unspecified: Secondary | ICD-10-CM | POA: Diagnosis not present

## 2016-07-20 DIAGNOSIS — E1122 Type 2 diabetes mellitus with diabetic chronic kidney disease: Secondary | ICD-10-CM | POA: Diagnosis not present

## 2016-07-20 DIAGNOSIS — I350 Nonrheumatic aortic (valve) stenosis: Secondary | ICD-10-CM | POA: Diagnosis not present

## 2016-07-20 DIAGNOSIS — E1142 Type 2 diabetes mellitus with diabetic polyneuropathy: Secondary | ICD-10-CM | POA: Diagnosis not present

## 2016-07-20 DIAGNOSIS — I471 Supraventricular tachycardia: Secondary | ICD-10-CM | POA: Diagnosis not present

## 2016-07-20 DIAGNOSIS — I129 Hypertensive chronic kidney disease with stage 1 through stage 4 chronic kidney disease, or unspecified chronic kidney disease: Secondary | ICD-10-CM | POA: Diagnosis not present

## 2016-07-22 DIAGNOSIS — I350 Nonrheumatic aortic (valve) stenosis: Secondary | ICD-10-CM | POA: Diagnosis not present

## 2016-07-22 DIAGNOSIS — I471 Supraventricular tachycardia: Secondary | ICD-10-CM | POA: Diagnosis not present

## 2016-07-22 DIAGNOSIS — E1122 Type 2 diabetes mellitus with diabetic chronic kidney disease: Secondary | ICD-10-CM | POA: Diagnosis not present

## 2016-07-22 DIAGNOSIS — E1142 Type 2 diabetes mellitus with diabetic polyneuropathy: Secondary | ICD-10-CM | POA: Diagnosis not present

## 2016-07-22 DIAGNOSIS — I129 Hypertensive chronic kidney disease with stage 1 through stage 4 chronic kidney disease, or unspecified chronic kidney disease: Secondary | ICD-10-CM | POA: Diagnosis not present

## 2016-07-22 DIAGNOSIS — N189 Chronic kidney disease, unspecified: Secondary | ICD-10-CM | POA: Diagnosis not present

## 2016-07-23 DIAGNOSIS — E1122 Type 2 diabetes mellitus with diabetic chronic kidney disease: Secondary | ICD-10-CM | POA: Diagnosis not present

## 2016-07-23 DIAGNOSIS — N189 Chronic kidney disease, unspecified: Secondary | ICD-10-CM | POA: Diagnosis not present

## 2016-07-23 DIAGNOSIS — I129 Hypertensive chronic kidney disease with stage 1 through stage 4 chronic kidney disease, or unspecified chronic kidney disease: Secondary | ICD-10-CM | POA: Diagnosis not present

## 2016-07-23 DIAGNOSIS — E1142 Type 2 diabetes mellitus with diabetic polyneuropathy: Secondary | ICD-10-CM | POA: Diagnosis not present

## 2016-07-23 DIAGNOSIS — I350 Nonrheumatic aortic (valve) stenosis: Secondary | ICD-10-CM | POA: Diagnosis not present

## 2016-07-23 DIAGNOSIS — I471 Supraventricular tachycardia: Secondary | ICD-10-CM | POA: Diagnosis not present

## 2016-07-26 ENCOUNTER — Telehealth: Payer: Self-pay | Admitting: Cardiovascular Disease

## 2016-07-26 DIAGNOSIS — E1122 Type 2 diabetes mellitus with diabetic chronic kidney disease: Secondary | ICD-10-CM | POA: Diagnosis not present

## 2016-07-26 DIAGNOSIS — N189 Chronic kidney disease, unspecified: Secondary | ICD-10-CM | POA: Diagnosis not present

## 2016-07-26 DIAGNOSIS — I350 Nonrheumatic aortic (valve) stenosis: Secondary | ICD-10-CM | POA: Diagnosis not present

## 2016-07-26 DIAGNOSIS — E1142 Type 2 diabetes mellitus with diabetic polyneuropathy: Secondary | ICD-10-CM | POA: Diagnosis not present

## 2016-07-26 DIAGNOSIS — I471 Supraventricular tachycardia: Secondary | ICD-10-CM | POA: Diagnosis not present

## 2016-07-26 DIAGNOSIS — I129 Hypertensive chronic kidney disease with stage 1 through stage 4 chronic kidney disease, or unspecified chronic kidney disease: Secondary | ICD-10-CM | POA: Diagnosis not present

## 2016-07-26 NOTE — Telephone Encounter (Signed)
Returned call to patient's daughter, ok per DPR. She states patient had been increasingly fatigued. Patient denies chest pain, SOB, dizziness, swelling or palpitations. Patient has been in Hospice care since March 2018 after a hospitalization. Patient has a re evaluation with Hospice tomorrow to see if they will continue care. BP's are stable: 5/7 155/61 HR 78 5/8 158/65 HR 74 5/17 151/72 HR 72 5/19 149/60 5/21 146/50.  Patient continues to have swelling of lower extremities and take furosemide daily. Sometimes swelling is more than others. Patient scheduled to see Dr Rockey Situ on 08/05/16. Advised to call us if any new concerns or issues arise and to also call patient's PCP to assess for evaluation as well if needed.

## 2016-07-26 NOTE — Telephone Encounter (Signed)
Pt daughter calling stating patient is getting more tired when standing  She is worried for her, is there a test or something we can do to see what may be causing this   Please advise.

## 2016-07-27 DIAGNOSIS — E1142 Type 2 diabetes mellitus with diabetic polyneuropathy: Secondary | ICD-10-CM | POA: Diagnosis not present

## 2016-07-27 DIAGNOSIS — I129 Hypertensive chronic kidney disease with stage 1 through stage 4 chronic kidney disease, or unspecified chronic kidney disease: Secondary | ICD-10-CM | POA: Diagnosis not present

## 2016-07-27 DIAGNOSIS — I471 Supraventricular tachycardia: Secondary | ICD-10-CM | POA: Diagnosis not present

## 2016-07-27 DIAGNOSIS — E1122 Type 2 diabetes mellitus with diabetic chronic kidney disease: Secondary | ICD-10-CM | POA: Diagnosis not present

## 2016-07-27 DIAGNOSIS — I350 Nonrheumatic aortic (valve) stenosis: Secondary | ICD-10-CM | POA: Diagnosis not present

## 2016-07-27 DIAGNOSIS — N189 Chronic kidney disease, unspecified: Secondary | ICD-10-CM | POA: Diagnosis not present

## 2016-07-28 DIAGNOSIS — R2232 Localized swelling, mass and lump, left upper limb: Secondary | ICD-10-CM | POA: Diagnosis not present

## 2016-07-29 DIAGNOSIS — M47812 Spondylosis without myelopathy or radiculopathy, cervical region: Secondary | ICD-10-CM | POA: Diagnosis not present

## 2016-07-29 DIAGNOSIS — I350 Nonrheumatic aortic (valve) stenosis: Secondary | ICD-10-CM | POA: Diagnosis not present

## 2016-07-29 DIAGNOSIS — Z9981 Dependence on supplemental oxygen: Secondary | ICD-10-CM | POA: Diagnosis not present

## 2016-07-29 DIAGNOSIS — I129 Hypertensive chronic kidney disease with stage 1 through stage 4 chronic kidney disease, or unspecified chronic kidney disease: Secondary | ICD-10-CM | POA: Diagnosis not present

## 2016-07-29 DIAGNOSIS — E785 Hyperlipidemia, unspecified: Secondary | ICD-10-CM | POA: Diagnosis not present

## 2016-07-29 DIAGNOSIS — I471 Supraventricular tachycardia: Secondary | ICD-10-CM | POA: Diagnosis not present

## 2016-07-29 DIAGNOSIS — M1990 Unspecified osteoarthritis, unspecified site: Secondary | ICD-10-CM | POA: Diagnosis not present

## 2016-07-29 DIAGNOSIS — R600 Localized edema: Secondary | ICD-10-CM | POA: Diagnosis not present

## 2016-07-29 DIAGNOSIS — N189 Chronic kidney disease, unspecified: Secondary | ICD-10-CM | POA: Diagnosis not present

## 2016-07-29 DIAGNOSIS — E1142 Type 2 diabetes mellitus with diabetic polyneuropathy: Secondary | ICD-10-CM | POA: Diagnosis not present

## 2016-07-29 DIAGNOSIS — D649 Anemia, unspecified: Secondary | ICD-10-CM | POA: Diagnosis not present

## 2016-07-29 DIAGNOSIS — M47817 Spondylosis without myelopathy or radiculopathy, lumbosacral region: Secondary | ICD-10-CM | POA: Diagnosis not present

## 2016-07-29 DIAGNOSIS — M1000 Idiopathic gout, unspecified site: Secondary | ICD-10-CM | POA: Diagnosis not present

## 2016-07-29 DIAGNOSIS — M810 Age-related osteoporosis without current pathological fracture: Secondary | ICD-10-CM | POA: Diagnosis not present

## 2016-07-29 DIAGNOSIS — K625 Hemorrhage of anus and rectum: Secondary | ICD-10-CM | POA: Diagnosis not present

## 2016-07-29 DIAGNOSIS — E1122 Type 2 diabetes mellitus with diabetic chronic kidney disease: Secondary | ICD-10-CM | POA: Diagnosis not present

## 2016-08-01 DIAGNOSIS — E1122 Type 2 diabetes mellitus with diabetic chronic kidney disease: Secondary | ICD-10-CM | POA: Diagnosis not present

## 2016-08-01 DIAGNOSIS — I350 Nonrheumatic aortic (valve) stenosis: Secondary | ICD-10-CM | POA: Diagnosis not present

## 2016-08-01 DIAGNOSIS — N189 Chronic kidney disease, unspecified: Secondary | ICD-10-CM | POA: Diagnosis not present

## 2016-08-01 DIAGNOSIS — I471 Supraventricular tachycardia: Secondary | ICD-10-CM | POA: Diagnosis not present

## 2016-08-01 DIAGNOSIS — I129 Hypertensive chronic kidney disease with stage 1 through stage 4 chronic kidney disease, or unspecified chronic kidney disease: Secondary | ICD-10-CM | POA: Diagnosis not present

## 2016-08-01 DIAGNOSIS — E1142 Type 2 diabetes mellitus with diabetic polyneuropathy: Secondary | ICD-10-CM | POA: Diagnosis not present

## 2016-08-02 DIAGNOSIS — I471 Supraventricular tachycardia: Secondary | ICD-10-CM | POA: Diagnosis not present

## 2016-08-02 DIAGNOSIS — E1122 Type 2 diabetes mellitus with diabetic chronic kidney disease: Secondary | ICD-10-CM | POA: Diagnosis not present

## 2016-08-02 DIAGNOSIS — I350 Nonrheumatic aortic (valve) stenosis: Secondary | ICD-10-CM | POA: Diagnosis not present

## 2016-08-02 DIAGNOSIS — N189 Chronic kidney disease, unspecified: Secondary | ICD-10-CM | POA: Diagnosis not present

## 2016-08-02 DIAGNOSIS — E1142 Type 2 diabetes mellitus with diabetic polyneuropathy: Secondary | ICD-10-CM | POA: Diagnosis not present

## 2016-08-02 DIAGNOSIS — I129 Hypertensive chronic kidney disease with stage 1 through stage 4 chronic kidney disease, or unspecified chronic kidney disease: Secondary | ICD-10-CM | POA: Diagnosis not present

## 2016-08-03 DIAGNOSIS — E1122 Type 2 diabetes mellitus with diabetic chronic kidney disease: Secondary | ICD-10-CM | POA: Diagnosis not present

## 2016-08-03 DIAGNOSIS — E1142 Type 2 diabetes mellitus with diabetic polyneuropathy: Secondary | ICD-10-CM | POA: Diagnosis not present

## 2016-08-03 DIAGNOSIS — I350 Nonrheumatic aortic (valve) stenosis: Secondary | ICD-10-CM | POA: Diagnosis not present

## 2016-08-03 DIAGNOSIS — I471 Supraventricular tachycardia: Secondary | ICD-10-CM | POA: Diagnosis not present

## 2016-08-03 DIAGNOSIS — I129 Hypertensive chronic kidney disease with stage 1 through stage 4 chronic kidney disease, or unspecified chronic kidney disease: Secondary | ICD-10-CM | POA: Diagnosis not present

## 2016-08-03 DIAGNOSIS — N189 Chronic kidney disease, unspecified: Secondary | ICD-10-CM | POA: Diagnosis not present

## 2016-08-05 ENCOUNTER — Telehealth: Payer: Self-pay | Admitting: Cardiovascular Disease

## 2016-08-05 ENCOUNTER — Ambulatory Visit: Payer: Medicare HMO | Admitting: Cardiovascular Disease

## 2016-08-05 DIAGNOSIS — I129 Hypertensive chronic kidney disease with stage 1 through stage 4 chronic kidney disease, or unspecified chronic kidney disease: Secondary | ICD-10-CM | POA: Diagnosis not present

## 2016-08-05 DIAGNOSIS — E1142 Type 2 diabetes mellitus with diabetic polyneuropathy: Secondary | ICD-10-CM | POA: Diagnosis not present

## 2016-08-05 DIAGNOSIS — N189 Chronic kidney disease, unspecified: Secondary | ICD-10-CM | POA: Diagnosis not present

## 2016-08-05 DIAGNOSIS — I350 Nonrheumatic aortic (valve) stenosis: Secondary | ICD-10-CM | POA: Diagnosis not present

## 2016-08-05 DIAGNOSIS — E1122 Type 2 diabetes mellitus with diabetic chronic kidney disease: Secondary | ICD-10-CM | POA: Diagnosis not present

## 2016-08-05 DIAGNOSIS — I471 Supraventricular tachycardia: Secondary | ICD-10-CM | POA: Diagnosis not present

## 2016-08-05 NOTE — Progress Notes (Deleted)
Cardiology Office Note  Date:  08/05/2016   ID:  [name], DOB [birth year], MRN [id number]  PCP:  Tegeler, Loma Sousa, MD   No chief complaint on file.   HPI:  [name] is a pleasant 83 year old woman with a history of  diabetes, poorly controlled hypertension,  SVT, Moderate aortic valve stenosis with hospital admission July 2016 for SVT after she developed numerous bug bites with associated pain. Numerous trips to the emergency room in the past year for malaise She presents for follow-up of her arrhythmia, hypertension, elevated glucose levels, general malaise and anorexia/weight loss  Since her last clinic visit she was seen in the emergency room 06/20/2016 Diagnosed with hand pain X-ray with nondisplaced fracture of third metacarpal Ultrasound negative for DVT Dear [name] level within normal range  We received a phone call 07/26/2016 Increased fatigue She is under hospice care since March 2018 Blood pressure 568 up to 127 systolic Continued lower extremity swelling, continues to take Lasix   Other past medical history reviewed Numerous trips to the emergency room over the past month or 2 Was in the emergency room last night for general malaise Daughters present with her today, reports that their mother was asking for help, did not feel right, was not very specific. Daughters do not no what to do as mother keeps asking for assistance In the emergency room glucose level 330, glucose in the urine Otherwise all other labs looked within normal range and she was discharged home   primary care,Dr. Tegeler  numerous issues including chronic back pain, labile hypertension, among other issues She expressed that hospice may be a appropriate indication at this time  Further discussion with the daughters, they do feel somewhat helpless, mother continues to ask for help, calling EMTs, no specific complaints just general malaise  On today's visit she reports that she does  not feel well, not very specific Not eating as much Drop in her albumin, total protein level over the past several months Hemoglobin A1c 6.8 one month ago Periodic glucose level since that time elevated in the 200s, more recently in the 300 yesterday  On Lasix every other day, improvement of her shortness of breath and leg swelling Denies any significant chest pain concerning for angina. Denies having any tachycardia concerning for arrhythmia  Previous hospitalization end of January with discharge 04/01/2016 She had sepsis, enterococcus , had TEE done in the hospital to rule out endocarditis Moderate aortic valve stenosis noted  Hematocrit improved over the last month  emergency room 04/12/16  fatigue, shortness breath Felt it was from anemia, deconditioning. Was not admitted to the hospital Family chose to bring her home, did not go to skilled nursing facility  hospital admission July 2016,She reports that she had a rash at the time. Turns out she went walking in her yard, developed insect bites all of her legs and abdomen (chiger bits). She was in a tremendous amount of discomfort with severe itching. Likely secondary to that, she developed arrhythmia. she converted back to normal sinus rhythm in the hospital. At the time of discharge her metoprolol was increased from 50 up to 75 mg daily. She is felt well with no further episodes of tachycardia. She currently lives at home, independent  admitted to the hospital 09/20/2012 discharged on 09/25/2012 for Klebsiella UTI, shortness breath, tachycardia, SVT. Arrival to the hospital, she was very weak and was unable to swallow. She was felt to be very dehydrated. Her potassium was repleted, she was started on  metoprolol and Cardizem.  Echocardiogram in the hospital 09/19/2012 showed ejection fraction 01-09%, diastolic dysfunction, mild LVH, moderate aortic valve stenosis, normal RV function and size  CT scan of the neck showed  enlargement of the thyroid with multinodular appearance, atherosclerotic disease, right pleural effusion Lab work in the hospital 09/11/2012 showing creatinine 1.57, BUN 44 Followup blood work showed creatinine 1.07, BUN 17 on July 23  PMH:   has a past medical history of Anemia; Bone disease; Carotid arterial disease ([place]); Cervical spine arthritis ([place]); Chronic diastolic CHF (congestive heart failure) ([place]); Cognitive impairment; Depression; Diabetes mellitus without complication ([place]); Essential hypertension; GERD (gastroesophageal reflux disease); Glaucoma; Gout; Hiatal hernia; History of renal impairment; Hyperlipidemia; Hypotension; Multinodular goiter; Paroxysmal SVT (supraventricular tachycardia) ([place]); Prolapsed uterus; Severe aortic stenosis; Stroke [facility]); and Vitamin deficiency.  PSH:    Past Surgical History:  Procedure Laterality Date  . TEE WITHOUT CARDIOVERSION N/A 03/31/2016   Procedure: TRANSESOPHAGEAL ECHOCARDIOGRAM (TEE);  Surgeon: Minna Merritts, MD;  Location: [facility] ORS;  Service: Cardiovascular;  Laterality: N/A;  . TUBAL LIGATION      Current Outpatient Prescriptions  Medication Sig Dispense Refill  . acetaminophen (TYLENOL) 500 MG tablet Take 500 mg by mouth every 6 (six) hours as needed.    Marland Kitchen allopurinol (ZYLOPRIM) 300 MG tablet Take 300 mg by mouth daily.    Marland Kitchen amoxicillin (AMOXIL) 500 MG capsule Take 500 mg by mouth 2 (two) times daily.    Marland Kitchen aspirin 81 MG tablet Take 81 mg by mouth daily.    . brimonidine (ALPHAGAN P) 0.1 % SOLN Place 1 drop into the right eye 2 (two) times daily.    . cholecalciferol (VITAMIN D) 1000 UNITS tablet Take 2,000 Units by mouth daily.     . Dorzolamide HCl-Timolol Mal (COSOPT OP) Place 1 drop into the right eye 2 (two) times daily.     Marland Kitchen doxazosin (CARDURA) 4 MG tablet Take 1 tablet (4 mg total) by mouth daily. 30 tablet 11  . feeding supplement, ENSURE ENLIVE, (ENSURE ENLIVE) LIQD Take 237 mLs by mouth 2 (two) times daily between meals.  237 mL 12  . fluconazole (DIFLUCAN) 150 MG tablet Take 150 mg by mouth daily.    . furosemide (LASIX) 20 MG tablet Take 1 tablet (20 mg total) by mouth as needed for edema. (Patient taking differently: Take 20 mg by mouth every other day. ) 30 tablet 6  . gabapentin (NEURONTIN) 300 MG capsule Take 300 mg by mouth 2 (two) times daily.    Marland Kitchen glimepiride (AMARYL) 1 MG tablet Take 1 mg by mouth daily with breakfast.    . hydrALAZINE (APRESOLINE) 25 MG tablet Take 1 tablet (25 mg total) by mouth 3 (three) times daily as needed. 90 tablet 6  . iron polysaccharides (NIFEREX) 150 MG capsule Take 150 mg by mouth 2 (two) times daily.    . isosorbide mononitrate (IMDUR) 60 MG 24 hr tablet Take 1 tablet (60 mg total) by mouth 2 (two) times daily. (Patient taking differently: Take 30 mg by mouth 2 (two) times daily. ) 60 tablet 6  . latanoprost (XALATAN) 0.005 % ophthalmic solution Place 1 drop into the right eye at bedtime.  3  . lidocaine (LIDODERM) 5 % Place 1 patch onto the skin daily. Remove & Discard patch within 12 hours or as directed by MD 15 patch 0  . lisinopril (PRINIVIL,ZESTRIL) 40 MG tablet Take 40 mg by mouth at bedtime.    Marland Kitchen LORazepam (ATIVAN) 1 MG tablet Take  1 tablet (1 mg total) by mouth 2 (two) times daily. 20 tablet 0  . lovastatin (MEVACOR) 40 MG tablet Take 40 mg by mouth at bedtime.    . metoprolol succinate (TOPROL-XL) 25 MG 24 hr tablet Take 3 tablets (75 mg total) by mouth daily. Take with or immediately following a meal. 120 tablet 0  . pantoprazole (PROTONIX) 40 MG tablet Take 40 mg by mouth 2 (two) times daily.    . pilocarpine (PILOCAR) 4 % ophthalmic solution Place 1 drop into the right eye 4 (four) times daily.     . potassium chloride (K-DUR) 10 MEQ tablet Take 1 tablet (10 mEq total) by mouth daily. Take with lasix (Patient taking differently: Take 10 mEq by mouth every other day. Take with lasix) 30 tablet 6  . tiZANidine (ZANAFLEX) 4 MG tablet Take 1 tablet by mouth 3 (three)  times daily as needed.  1  . traMADol (ULTRAM) 50 MG tablet Take 1 tablet (50 mg total) by mouth every 6 (six) hours as needed for moderate pain. (Patient not taking: Reported on 07/26/2016) 30 tablet 0   No current facility-administered medications for this visit.      Allergies:   Metformin and related   Social History:  The patient  reports that she has never smoked. She has never used smokeless tobacco. She reports that she does not drink alcohol or use drugs.   Family History:   family history includes Colitis in her father; Diabetes in her mother; Glaucoma in her mother; Peptic Ulcer in her father.    Review of Systems: Review of Systems  Constitutional: Positive for malaise/fatigue.       General malaise  Respiratory: Negative.   Cardiovascular: Negative.   Gastrointestinal: Negative.   Musculoskeletal: Negative.        Leg weakness  Neurological: Positive for weakness.  Psychiatric/Behavioral: Negative.   All other systems reviewed and are negative.    PHYSICAL EXAM: VS:  There were no vitals taken for this visit. , BMI There is no height or weight on file to calculate BMI. GEN: Well nourished, well developed, in no acute distress , presents in a wheelchair, appears weak, had bent over, minimally conversant HEENT: normal  Neck: no JVD, carotid bruits, or masses Cardiac: RRR; no murmurs, rubs, or gallops,no edema  Respiratory:  clear to auscultation bilaterally, normal work of breathing GI: soft, nontender, nondistended, + BS MS: no deformity or atrophy  Skin: warm and dry, no rash Neuro:  Strength and sensation are intact Psych: euthymic mood, full affect    Recent Labs: 03/27/2016: ALT 20 03/28/2016: TSH 0.681 06/20/2016: BUN 12; Creatinine, Ser 0.62; Hemoglobin 11.3; Platelets 212; Potassium 3.3; Sodium 138    Lipid Panel Lab Results  Component Value Date   CHOL 114 09/11/2014   HDL 40 (L) 09/11/2014   LDLCALC 55 09/11/2014   TRIG 97 09/11/2014       Wt Readings from Last 3 Encounters:  06/20/16 138 lb (62.6 kg)  05/03/16 140 lb 8 oz (63.7 kg)  05/02/16 145 lb (65.8 kg)       ASSESSMENT AND PLAN:  Encounter for end of life care Discussed case with primary care on the phone with the patient presents today Long discussion with patient and 2 daughters Given numerous medical issues, failure to thrive, anorexia, chronic pain issues, recommended today discuss hospice consultation with primary care. Primary care has indicated she would be in support of home hospice.  SVT (supraventricular tachycardia) ([place]) Denies  having any significant arrhythmia No changes to her medications, we'll continue metoprolol 75 mg daily  Essential hypertension, malignant Blood pressure is well controlled on today's visit. No changes made to the medications.  Chronic diastolic CHF (congestive heart failure) ([facility]) Stable at this time, normal renal function, does not appear dehydrated. No fluid overload grossly on exam  Aortic valve stenosis, moderate Aortic valve stenosis is not critical, moderate by TEE  SOB (shortness of breath) Shortness of breath significantly improved on Lasix every other day She does not have a high fluid intake  Diabetes mellitus without complication ([place]) Reported controlled sugars recently, suggested that perhaps home nursing could check sugars and give insulin with meals Unclear if medication changes need to be made to current diabetes medication  Malaise Etiology unclear, nothing specific. Patient continues to request EMT transportation to the hospital but everything checks out okay and she is sent back home  Acute midline low back pain, with sciatica presence unspecified Chronic back pain, likely will require pain medication from hospice  Disposition:   F/U  6 months   Total encounter time more than 45 minutes  Greater than 50% was spent in counseling and coordination of care with the patient   No orders of the  defined types were placed in this encounter.    Signed, Esmond Plants, M.D., Ph.D. 08/05/2016  [place], [place]

## 2016-08-05 NOTE — Telephone Encounter (Signed)
Patient arrived late for check in todays appt. Unable to be seen .  Patient daughter taking her to [facility] for eval and she is fatigued weak unable to ambulate to bed side commode

## 2016-08-06 DIAGNOSIS — I129 Hypertensive chronic kidney disease with stage 1 through stage 4 chronic kidney disease, or unspecified chronic kidney disease: Secondary | ICD-10-CM | POA: Diagnosis not present

## 2016-08-06 DIAGNOSIS — E1122 Type 2 diabetes mellitus with diabetic chronic kidney disease: Secondary | ICD-10-CM | POA: Diagnosis not present

## 2016-08-06 DIAGNOSIS — E1142 Type 2 diabetes mellitus with diabetic polyneuropathy: Secondary | ICD-10-CM | POA: Diagnosis not present

## 2016-08-06 DIAGNOSIS — I471 Supraventricular tachycardia: Secondary | ICD-10-CM | POA: Diagnosis not present

## 2016-08-06 DIAGNOSIS — N189 Chronic kidney disease, unspecified: Secondary | ICD-10-CM | POA: Diagnosis not present

## 2016-08-06 DIAGNOSIS — I350 Nonrheumatic aortic (valve) stenosis: Secondary | ICD-10-CM | POA: Diagnosis not present

## 2016-08-08 DIAGNOSIS — N189 Chronic kidney disease, unspecified: Secondary | ICD-10-CM | POA: Diagnosis not present

## 2016-08-08 DIAGNOSIS — E1142 Type 2 diabetes mellitus with diabetic polyneuropathy: Secondary | ICD-10-CM | POA: Diagnosis not present

## 2016-08-08 DIAGNOSIS — E1122 Type 2 diabetes mellitus with diabetic chronic kidney disease: Secondary | ICD-10-CM | POA: Diagnosis not present

## 2016-08-08 DIAGNOSIS — I350 Nonrheumatic aortic (valve) stenosis: Secondary | ICD-10-CM | POA: Diagnosis not present

## 2016-08-08 DIAGNOSIS — I471 Supraventricular tachycardia: Secondary | ICD-10-CM | POA: Diagnosis not present

## 2016-08-08 DIAGNOSIS — I129 Hypertensive chronic kidney disease with stage 1 through stage 4 chronic kidney disease, or unspecified chronic kidney disease: Secondary | ICD-10-CM | POA: Diagnosis not present

## 2016-08-09 DIAGNOSIS — I471 Supraventricular tachycardia: Secondary | ICD-10-CM | POA: Diagnosis not present

## 2016-08-09 DIAGNOSIS — E1142 Type 2 diabetes mellitus with diabetic polyneuropathy: Secondary | ICD-10-CM | POA: Diagnosis not present

## 2016-08-09 DIAGNOSIS — N189 Chronic kidney disease, unspecified: Secondary | ICD-10-CM | POA: Diagnosis not present

## 2016-08-09 DIAGNOSIS — I350 Nonrheumatic aortic (valve) stenosis: Secondary | ICD-10-CM | POA: Diagnosis not present

## 2016-08-09 DIAGNOSIS — E1122 Type 2 diabetes mellitus with diabetic chronic kidney disease: Secondary | ICD-10-CM | POA: Diagnosis not present

## 2016-08-09 DIAGNOSIS — I129 Hypertensive chronic kidney disease with stage 1 through stage 4 chronic kidney disease, or unspecified chronic kidney disease: Secondary | ICD-10-CM | POA: Diagnosis not present

## 2016-08-10 DIAGNOSIS — I471 Supraventricular tachycardia: Secondary | ICD-10-CM | POA: Diagnosis not present

## 2016-08-10 DIAGNOSIS — E1122 Type 2 diabetes mellitus with diabetic chronic kidney disease: Secondary | ICD-10-CM | POA: Diagnosis not present

## 2016-08-10 DIAGNOSIS — N189 Chronic kidney disease, unspecified: Secondary | ICD-10-CM | POA: Diagnosis not present

## 2016-08-10 DIAGNOSIS — I350 Nonrheumatic aortic (valve) stenosis: Secondary | ICD-10-CM | POA: Diagnosis not present

## 2016-08-10 DIAGNOSIS — E1142 Type 2 diabetes mellitus with diabetic polyneuropathy: Secondary | ICD-10-CM | POA: Diagnosis not present

## 2016-08-10 DIAGNOSIS — I129 Hypertensive chronic kidney disease with stage 1 through stage 4 chronic kidney disease, or unspecified chronic kidney disease: Secondary | ICD-10-CM | POA: Diagnosis not present

## 2016-08-12 DIAGNOSIS — I471 Supraventricular tachycardia: Secondary | ICD-10-CM | POA: Diagnosis not present

## 2016-08-12 DIAGNOSIS — I350 Nonrheumatic aortic (valve) stenosis: Secondary | ICD-10-CM | POA: Diagnosis not present

## 2016-08-12 DIAGNOSIS — I129 Hypertensive chronic kidney disease with stage 1 through stage 4 chronic kidney disease, or unspecified chronic kidney disease: Secondary | ICD-10-CM | POA: Diagnosis not present

## 2016-08-12 DIAGNOSIS — E1122 Type 2 diabetes mellitus with diabetic chronic kidney disease: Secondary | ICD-10-CM | POA: Diagnosis not present

## 2016-08-12 DIAGNOSIS — E1142 Type 2 diabetes mellitus with diabetic polyneuropathy: Secondary | ICD-10-CM | POA: Diagnosis not present

## 2016-08-12 DIAGNOSIS — N189 Chronic kidney disease, unspecified: Secondary | ICD-10-CM | POA: Diagnosis not present

## 2016-08-15 DIAGNOSIS — I129 Hypertensive chronic kidney disease with stage 1 through stage 4 chronic kidney disease, or unspecified chronic kidney disease: Secondary | ICD-10-CM | POA: Diagnosis not present

## 2016-08-15 DIAGNOSIS — E1142 Type 2 diabetes mellitus with diabetic polyneuropathy: Secondary | ICD-10-CM | POA: Diagnosis not present

## 2016-08-15 DIAGNOSIS — I350 Nonrheumatic aortic (valve) stenosis: Secondary | ICD-10-CM | POA: Diagnosis not present

## 2016-08-15 DIAGNOSIS — N189 Chronic kidney disease, unspecified: Secondary | ICD-10-CM | POA: Diagnosis not present

## 2016-08-15 DIAGNOSIS — I471 Supraventricular tachycardia: Secondary | ICD-10-CM | POA: Diagnosis not present

## 2016-08-15 DIAGNOSIS — E1122 Type 2 diabetes mellitus with diabetic chronic kidney disease: Secondary | ICD-10-CM | POA: Diagnosis not present

## 2016-08-16 DIAGNOSIS — N189 Chronic kidney disease, unspecified: Secondary | ICD-10-CM | POA: Diagnosis not present

## 2016-08-16 DIAGNOSIS — I471 Supraventricular tachycardia: Secondary | ICD-10-CM | POA: Diagnosis not present

## 2016-08-16 DIAGNOSIS — E1122 Type 2 diabetes mellitus with diabetic chronic kidney disease: Secondary | ICD-10-CM | POA: Diagnosis not present

## 2016-08-16 DIAGNOSIS — E1142 Type 2 diabetes mellitus with diabetic polyneuropathy: Secondary | ICD-10-CM | POA: Diagnosis not present

## 2016-08-16 DIAGNOSIS — I350 Nonrheumatic aortic (valve) stenosis: Secondary | ICD-10-CM | POA: Diagnosis not present

## 2016-08-16 DIAGNOSIS — I129 Hypertensive chronic kidney disease with stage 1 through stage 4 chronic kidney disease, or unspecified chronic kidney disease: Secondary | ICD-10-CM | POA: Diagnosis not present

## 2016-08-17 DIAGNOSIS — E1122 Type 2 diabetes mellitus with diabetic chronic kidney disease: Secondary | ICD-10-CM | POA: Diagnosis not present

## 2016-08-17 DIAGNOSIS — I350 Nonrheumatic aortic (valve) stenosis: Secondary | ICD-10-CM | POA: Diagnosis not present

## 2016-08-17 DIAGNOSIS — E1142 Type 2 diabetes mellitus with diabetic polyneuropathy: Secondary | ICD-10-CM | POA: Diagnosis not present

## 2016-08-17 DIAGNOSIS — I129 Hypertensive chronic kidney disease with stage 1 through stage 4 chronic kidney disease, or unspecified chronic kidney disease: Secondary | ICD-10-CM | POA: Diagnosis not present

## 2016-08-17 DIAGNOSIS — N189 Chronic kidney disease, unspecified: Secondary | ICD-10-CM | POA: Diagnosis not present

## 2016-08-17 DIAGNOSIS — I471 Supraventricular tachycardia: Secondary | ICD-10-CM | POA: Diagnosis not present

## 2016-08-19 DIAGNOSIS — N189 Chronic kidney disease, unspecified: Secondary | ICD-10-CM | POA: Diagnosis not present

## 2016-08-19 DIAGNOSIS — E1142 Type 2 diabetes mellitus with diabetic polyneuropathy: Secondary | ICD-10-CM | POA: Diagnosis not present

## 2016-08-19 DIAGNOSIS — I471 Supraventricular tachycardia: Secondary | ICD-10-CM | POA: Diagnosis not present

## 2016-08-19 DIAGNOSIS — I350 Nonrheumatic aortic (valve) stenosis: Secondary | ICD-10-CM | POA: Diagnosis not present

## 2016-08-19 DIAGNOSIS — E1122 Type 2 diabetes mellitus with diabetic chronic kidney disease: Secondary | ICD-10-CM | POA: Diagnosis not present

## 2016-08-19 DIAGNOSIS — I129 Hypertensive chronic kidney disease with stage 1 through stage 4 chronic kidney disease, or unspecified chronic kidney disease: Secondary | ICD-10-CM | POA: Diagnosis not present

## 2016-08-22 DIAGNOSIS — E1122 Type 2 diabetes mellitus with diabetic chronic kidney disease: Secondary | ICD-10-CM | POA: Diagnosis not present

## 2016-08-22 DIAGNOSIS — I350 Nonrheumatic aortic (valve) stenosis: Secondary | ICD-10-CM | POA: Diagnosis not present

## 2016-08-22 DIAGNOSIS — E1142 Type 2 diabetes mellitus with diabetic polyneuropathy: Secondary | ICD-10-CM | POA: Diagnosis not present

## 2016-08-22 DIAGNOSIS — I471 Supraventricular tachycardia: Secondary | ICD-10-CM | POA: Diagnosis not present

## 2016-08-22 DIAGNOSIS — I129 Hypertensive chronic kidney disease with stage 1 through stage 4 chronic kidney disease, or unspecified chronic kidney disease: Secondary | ICD-10-CM | POA: Diagnosis not present

## 2016-08-22 DIAGNOSIS — N189 Chronic kidney disease, unspecified: Secondary | ICD-10-CM | POA: Diagnosis not present

## 2016-08-23 DIAGNOSIS — I471 Supraventricular tachycardia: Secondary | ICD-10-CM | POA: Diagnosis not present

## 2016-08-23 DIAGNOSIS — I129 Hypertensive chronic kidney disease with stage 1 through stage 4 chronic kidney disease, or unspecified chronic kidney disease: Secondary | ICD-10-CM | POA: Diagnosis not present

## 2016-08-23 DIAGNOSIS — I350 Nonrheumatic aortic (valve) stenosis: Secondary | ICD-10-CM | POA: Diagnosis not present

## 2016-08-23 DIAGNOSIS — M109 Gout, unspecified: Secondary | ICD-10-CM | POA: Diagnosis not present

## 2016-08-23 DIAGNOSIS — E1122 Type 2 diabetes mellitus with diabetic chronic kidney disease: Secondary | ICD-10-CM | POA: Diagnosis not present

## 2016-08-23 DIAGNOSIS — N189 Chronic kidney disease, unspecified: Secondary | ICD-10-CM | POA: Diagnosis not present

## 2016-08-23 DIAGNOSIS — E1142 Type 2 diabetes mellitus with diabetic polyneuropathy: Secondary | ICD-10-CM | POA: Diagnosis not present

## 2016-08-24 DIAGNOSIS — E1142 Type 2 diabetes mellitus with diabetic polyneuropathy: Secondary | ICD-10-CM | POA: Diagnosis not present

## 2016-08-24 DIAGNOSIS — I350 Nonrheumatic aortic (valve) stenosis: Secondary | ICD-10-CM | POA: Diagnosis not present

## 2016-08-24 DIAGNOSIS — N189 Chronic kidney disease, unspecified: Secondary | ICD-10-CM | POA: Diagnosis not present

## 2016-08-24 DIAGNOSIS — I471 Supraventricular tachycardia: Secondary | ICD-10-CM | POA: Diagnosis not present

## 2016-08-24 DIAGNOSIS — I129 Hypertensive chronic kidney disease with stage 1 through stage 4 chronic kidney disease, or unspecified chronic kidney disease: Secondary | ICD-10-CM | POA: Diagnosis not present

## 2016-08-24 DIAGNOSIS — E1122 Type 2 diabetes mellitus with diabetic chronic kidney disease: Secondary | ICD-10-CM | POA: Diagnosis not present

## 2016-08-25 DIAGNOSIS — I350 Nonrheumatic aortic (valve) stenosis: Secondary | ICD-10-CM | POA: Diagnosis not present

## 2016-08-25 DIAGNOSIS — I471 Supraventricular tachycardia: Secondary | ICD-10-CM | POA: Diagnosis not present

## 2016-08-25 DIAGNOSIS — N189 Chronic kidney disease, unspecified: Secondary | ICD-10-CM | POA: Diagnosis not present

## 2016-08-25 DIAGNOSIS — E1122 Type 2 diabetes mellitus with diabetic chronic kidney disease: Secondary | ICD-10-CM | POA: Diagnosis not present

## 2016-08-25 DIAGNOSIS — E1142 Type 2 diabetes mellitus with diabetic polyneuropathy: Secondary | ICD-10-CM | POA: Diagnosis not present

## 2016-08-25 DIAGNOSIS — I129 Hypertensive chronic kidney disease with stage 1 through stage 4 chronic kidney disease, or unspecified chronic kidney disease: Secondary | ICD-10-CM | POA: Diagnosis not present

## 2016-08-26 DIAGNOSIS — E1122 Type 2 diabetes mellitus with diabetic chronic kidney disease: Secondary | ICD-10-CM | POA: Diagnosis not present

## 2016-08-26 DIAGNOSIS — I350 Nonrheumatic aortic (valve) stenosis: Secondary | ICD-10-CM | POA: Diagnosis not present

## 2016-08-26 DIAGNOSIS — N189 Chronic kidney disease, unspecified: Secondary | ICD-10-CM | POA: Diagnosis not present

## 2016-08-26 DIAGNOSIS — E1142 Type 2 diabetes mellitus with diabetic polyneuropathy: Secondary | ICD-10-CM | POA: Diagnosis not present

## 2016-08-26 DIAGNOSIS — I129 Hypertensive chronic kidney disease with stage 1 through stage 4 chronic kidney disease, or unspecified chronic kidney disease: Secondary | ICD-10-CM | POA: Diagnosis not present

## 2016-08-26 DIAGNOSIS — I471 Supraventricular tachycardia: Secondary | ICD-10-CM | POA: Diagnosis not present

## 2016-08-28 DIAGNOSIS — M47812 Spondylosis without myelopathy or radiculopathy, cervical region: Secondary | ICD-10-CM | POA: Diagnosis not present

## 2016-08-28 DIAGNOSIS — N189 Chronic kidney disease, unspecified: Secondary | ICD-10-CM | POA: Diagnosis not present

## 2016-08-28 DIAGNOSIS — M1000 Idiopathic gout, unspecified site: Secondary | ICD-10-CM | POA: Diagnosis not present

## 2016-08-28 DIAGNOSIS — M810 Age-related osteoporosis without current pathological fracture: Secondary | ICD-10-CM | POA: Diagnosis not present

## 2016-08-28 DIAGNOSIS — R600 Localized edema: Secondary | ICD-10-CM | POA: Diagnosis not present

## 2016-08-28 DIAGNOSIS — I129 Hypertensive chronic kidney disease with stage 1 through stage 4 chronic kidney disease, or unspecified chronic kidney disease: Secondary | ICD-10-CM | POA: Diagnosis not present

## 2016-08-28 DIAGNOSIS — I471 Supraventricular tachycardia: Secondary | ICD-10-CM | POA: Diagnosis not present

## 2016-08-28 DIAGNOSIS — E1122 Type 2 diabetes mellitus with diabetic chronic kidney disease: Secondary | ICD-10-CM | POA: Diagnosis not present

## 2016-08-28 DIAGNOSIS — R531 Weakness: Secondary | ICD-10-CM | POA: Diagnosis not present

## 2016-08-28 DIAGNOSIS — M1990 Unspecified osteoarthritis, unspecified site: Secondary | ICD-10-CM | POA: Diagnosis not present

## 2016-08-28 DIAGNOSIS — E1142 Type 2 diabetes mellitus with diabetic polyneuropathy: Secondary | ICD-10-CM | POA: Diagnosis not present

## 2016-08-28 DIAGNOSIS — M47817 Spondylosis without myelopathy or radiculopathy, lumbosacral region: Secondary | ICD-10-CM | POA: Diagnosis not present

## 2016-08-28 DIAGNOSIS — D649 Anemia, unspecified: Secondary | ICD-10-CM | POA: Diagnosis not present

## 2016-08-28 DIAGNOSIS — K625 Hemorrhage of anus and rectum: Secondary | ICD-10-CM | POA: Diagnosis not present

## 2016-08-28 DIAGNOSIS — Z9981 Dependence on supplemental oxygen: Secondary | ICD-10-CM | POA: Diagnosis not present

## 2016-08-28 DIAGNOSIS — E785 Hyperlipidemia, unspecified: Secondary | ICD-10-CM | POA: Diagnosis not present

## 2016-08-28 DIAGNOSIS — I350 Nonrheumatic aortic (valve) stenosis: Secondary | ICD-10-CM | POA: Diagnosis not present

## 2016-08-29 ENCOUNTER — Inpatient Hospital Stay

## 2016-08-29 ENCOUNTER — Encounter: Payer: Self-pay | Admitting: Emergency Medicine

## 2016-08-29 ENCOUNTER — Emergency Department

## 2016-08-29 ENCOUNTER — Inpatient Hospital Stay
Admission: EM | Admit: 2016-08-29 | Discharge: 2016-09-02 | DRG: 291 | Disposition: A | Discharge status: Skilled Nursing Facility | Attending: Internal Medicine | Admitting: Internal Medicine

## 2016-08-29 DIAGNOSIS — K219 Gastro-esophageal reflux disease without esophagitis: Secondary | ICD-10-CM | POA: Diagnosis present

## 2016-08-29 DIAGNOSIS — E118 Type 2 diabetes mellitus with unspecified complications: Secondary | ICD-10-CM | POA: Diagnosis not present

## 2016-08-29 DIAGNOSIS — I071 Rheumatic tricuspid insufficiency: Secondary | ICD-10-CM | POA: Diagnosis present

## 2016-08-29 DIAGNOSIS — E46 Unspecified protein-calorie malnutrition: Secondary | ICD-10-CM | POA: Diagnosis not present

## 2016-08-29 DIAGNOSIS — R4189 Other symptoms and signs involving cognitive functions and awareness: Secondary | ICD-10-CM | POA: Diagnosis present

## 2016-08-29 DIAGNOSIS — I5033 Acute on chronic diastolic (congestive) heart failure: Secondary | ICD-10-CM | POA: Diagnosis not present

## 2016-08-29 DIAGNOSIS — Z833 Family history of diabetes mellitus: Secondary | ICD-10-CM

## 2016-08-29 DIAGNOSIS — I110 Hypertensive heart disease with heart failure: Principal | ICD-10-CM | POA: Diagnosis present

## 2016-08-29 DIAGNOSIS — E785 Hyperlipidemia, unspecified: Secondary | ICD-10-CM | POA: Diagnosis present

## 2016-08-29 DIAGNOSIS — Z8673 Personal history of transient ischemic attack (TIA), and cerebral infarction without residual deficits: Secondary | ICD-10-CM

## 2016-08-29 DIAGNOSIS — I10 Essential (primary) hypertension: Secondary | ICD-10-CM | POA: Diagnosis not present

## 2016-08-29 DIAGNOSIS — R1312 Dysphagia, oropharyngeal phase: Secondary | ICD-10-CM | POA: Diagnosis not present

## 2016-08-29 DIAGNOSIS — I129 Hypertensive chronic kidney disease with stage 1 through stage 4 chronic kidney disease, or unspecified chronic kidney disease: Secondary | ICD-10-CM | POA: Diagnosis not present

## 2016-08-29 DIAGNOSIS — R748 Abnormal levels of other serum enzymes: Secondary | ICD-10-CM | POA: Diagnosis not present

## 2016-08-29 DIAGNOSIS — R1310 Dysphagia, unspecified: Secondary | ICD-10-CM | POA: Diagnosis present

## 2016-08-29 DIAGNOSIS — R627 Adult failure to thrive: Secondary | ICD-10-CM | POA: Diagnosis present

## 2016-08-29 DIAGNOSIS — R778 Other specified abnormalities of plasma proteins: Secondary | ICD-10-CM | POA: Diagnosis present

## 2016-08-29 DIAGNOSIS — Z888 Allergy status to other drugs, medicaments and biological substances status: Secondary | ICD-10-CM

## 2016-08-29 DIAGNOSIS — N189 Chronic kidney disease, unspecified: Secondary | ICD-10-CM | POA: Diagnosis not present

## 2016-08-29 DIAGNOSIS — R7989 Other specified abnormal findings of blood chemistry: Secondary | ICD-10-CM

## 2016-08-29 DIAGNOSIS — Z6827 Body mass index (BMI) 27.0-27.9, adult: Secondary | ICD-10-CM

## 2016-08-29 DIAGNOSIS — E876 Hypokalemia: Secondary | ICD-10-CM | POA: Diagnosis present

## 2016-08-29 DIAGNOSIS — R498 Other voice and resonance disorders: Secondary | ICD-10-CM | POA: Diagnosis not present

## 2016-08-29 DIAGNOSIS — F5101 Primary insomnia: Secondary | ICD-10-CM | POA: Diagnosis not present

## 2016-08-29 DIAGNOSIS — D649 Anemia, unspecified: Secondary | ICD-10-CM | POA: Diagnosis present

## 2016-08-29 DIAGNOSIS — Z7982 Long term (current) use of aspirin: Secondary | ICD-10-CM | POA: Diagnosis not present

## 2016-08-29 DIAGNOSIS — R0603 Acute respiratory distress: Secondary | ICD-10-CM

## 2016-08-29 DIAGNOSIS — J9621 Acute and chronic respiratory failure with hypoxia: Secondary | ICD-10-CM | POA: Diagnosis present

## 2016-08-29 DIAGNOSIS — I350 Nonrheumatic aortic (valve) stenosis: Secondary | ICD-10-CM | POA: Diagnosis present

## 2016-08-29 DIAGNOSIS — I272 Pulmonary hypertension, unspecified: Secondary | ICD-10-CM | POA: Diagnosis present

## 2016-08-29 DIAGNOSIS — I248 Other forms of acute ischemic heart disease: Secondary | ICD-10-CM | POA: Diagnosis present

## 2016-08-29 DIAGNOSIS — Z83511 Family history of glaucoma: Secondary | ICD-10-CM

## 2016-08-29 DIAGNOSIS — H409 Unspecified glaucoma: Secondary | ICD-10-CM | POA: Diagnosis present

## 2016-08-29 DIAGNOSIS — Z7984 Long term (current) use of oral hypoglycemic drugs: Secondary | ICD-10-CM

## 2016-08-29 DIAGNOSIS — M4692 Unspecified inflammatory spondylopathy, cervical region: Secondary | ICD-10-CM | POA: Diagnosis present

## 2016-08-29 DIAGNOSIS — I471 Supraventricular tachycardia: Secondary | ICD-10-CM | POA: Diagnosis not present

## 2016-08-29 DIAGNOSIS — E119 Type 2 diabetes mellitus without complications: Secondary | ICD-10-CM | POA: Diagnosis present

## 2016-08-29 DIAGNOSIS — E1142 Type 2 diabetes mellitus with diabetic polyneuropathy: Secondary | ICD-10-CM | POA: Diagnosis not present

## 2016-08-29 DIAGNOSIS — E042 Nontoxic multinodular goiter: Secondary | ICD-10-CM | POA: Diagnosis present

## 2016-08-29 DIAGNOSIS — R6881 Early satiety: Secondary | ICD-10-CM | POA: Diagnosis present

## 2016-08-29 DIAGNOSIS — Z5189 Encounter for other specified aftercare: Secondary | ICD-10-CM | POA: Diagnosis not present

## 2016-08-29 DIAGNOSIS — J9620 Acute and chronic respiratory failure, unspecified whether with hypoxia or hypercapnia: Secondary | ICD-10-CM | POA: Diagnosis not present

## 2016-08-29 DIAGNOSIS — I509 Heart failure, unspecified: Secondary | ICD-10-CM | POA: Diagnosis not present

## 2016-08-29 DIAGNOSIS — Z9981 Dependence on supplemental oxygen: Secondary | ICD-10-CM

## 2016-08-29 DIAGNOSIS — Z79899 Other long term (current) drug therapy: Secondary | ICD-10-CM

## 2016-08-29 DIAGNOSIS — E43 Unspecified severe protein-calorie malnutrition: Secondary | ICD-10-CM | POA: Diagnosis not present

## 2016-08-29 DIAGNOSIS — M6281 Muscle weakness (generalized): Secondary | ICD-10-CM | POA: Diagnosis not present

## 2016-08-29 DIAGNOSIS — E1122 Type 2 diabetes mellitus with diabetic chronic kidney disease: Secondary | ICD-10-CM | POA: Diagnosis not present

## 2016-08-29 LAB — BASIC METABOLIC PANEL
Anion gap: 9 (ref 5–15)
BUN: 19 mg/dL (ref 6–20)
CO2: 31 mmol/L (ref 22–32)
CREATININE: 0.62 mg/dL (ref 0.44–1.00)
Calcium: 9.5 mg/dL (ref 8.9–10.3)
Chloride: 100 mmol/L — ABNORMAL LOW (ref 101–111)
GFR calc non Af Amer: >60 mL/min (ref >60)
GLUCOSE: 87 mg/dL (ref 65–99)
Potassium: 3.1 mmol/L — ABNORMAL LOW (ref 3.5–5.1)
Sodium: 140 mmol/L (ref 135–145)

## 2016-08-29 LAB — CBC
HCT: 31.3 % — ABNORMAL LOW (ref 35.0–47.0)
Hemoglobin: 10.3 g/dL — ABNORMAL LOW (ref 12.0–16.0)
MCH: 29.8 pg (ref 26.0–34.0)
MCHC: 32.9 g/dL (ref 32.0–36.0)
MCV: 90.3 fL (ref 80.0–100.0)
Platelets: 193 10*3/uL (ref 150–440)
RBC: 3.47 MIL/uL — AB (ref 3.80–5.20)
RDW: 17.4 % — ABNORMAL HIGH (ref 11.5–14.5)
WBC: 7.6 10*3/uL (ref 3.6–11.0)

## 2016-08-29 LAB — BRAIN NATRIURETIC PEPTIDE: B Natriuretic Peptide: 1576 pg/mL — ABNORMAL HIGH (ref 0.0–100.0)

## 2016-08-29 LAB — GLUCOSE, CAPILLARY: Glucose-Capillary: 87 mg/dL (ref 65–99)

## 2016-08-29 LAB — MAGNESIUM: Magnesium: 1.3 mg/dL — ABNORMAL LOW (ref 1.7–2.4)

## 2016-08-29 LAB — TROPONIN I: Troponin I: 0.07 ng/mL (ref <0.03)

## 2016-08-29 MED ORDER — ACETAMINOPHEN 325 MG PO TABS
650.0000 mg | ORAL_TABLET | Freq: Four times a day (QID) | ORAL | Status: DC | PRN
  Administered 2016-08-31 (×2): 650 mg via ORAL
  Filled 2016-08-29 (×2): qty 2

## 2016-08-29 MED ORDER — SODIUM CHLORIDE 0.9% FLUSH
3.0000 mL | Freq: Two times a day (BID) | INTRAVENOUS | Status: DC
  Administered 2016-08-29 – 2016-09-01 (×7): 3 mL via INTRAVENOUS

## 2016-08-29 MED ORDER — TRAZODONE HCL 100 MG PO TABS
100.0000 mg | ORAL_TABLET | Freq: Every day | ORAL | Status: DC
  Administered 2016-08-29 – 2016-09-01 (×4): 100 mg via ORAL
  Filled 2016-08-29 (×4): qty 1

## 2016-08-29 MED ORDER — LORAZEPAM 1 MG PO TABS
1.0000 mg | ORAL_TABLET | Freq: Two times a day (BID) | ORAL | Status: DC
  Administered 2016-08-29 – 2016-08-30 (×2): 1 mg via ORAL
  Filled 2016-08-29 (×2): qty 1

## 2016-08-29 MED ORDER — BISACODYL 5 MG PO TBEC
5.0000 mg | DELAYED_RELEASE_TABLET | Freq: Every day | ORAL | Status: DC | PRN
  Administered 2016-09-02: 5 mg via ORAL
  Filled 2016-08-29: qty 1

## 2016-08-29 MED ORDER — LATANOPROST 0.005 % OP SOLN
1.0000 [drp] | Freq: Every day | OPHTHALMIC | Status: DC
  Administered 2016-08-29 – 2016-09-01 (×4): 1 [drp] via OPHTHALMIC
  Filled 2016-08-29: qty 2.5

## 2016-08-29 MED ORDER — ISOSORBIDE MONONITRATE ER 30 MG PO TB24
30.0000 mg | ORAL_TABLET | Freq: Every day | ORAL | Status: DC
  Administered 2016-08-29 – 2016-09-02 (×4): 30 mg via ORAL
  Filled 2016-08-29 (×4): qty 1

## 2016-08-29 MED ORDER — POTASSIUM CHLORIDE CRYS ER 20 MEQ PO TBCR
40.0000 meq | EXTENDED_RELEASE_TABLET | Freq: Two times a day (BID) | ORAL | Status: AC
  Administered 2016-08-29 – 2016-08-30 (×2): 40 meq via ORAL
  Filled 2016-08-29 (×2): qty 2

## 2016-08-29 MED ORDER — GABAPENTIN 300 MG PO CAPS
300.0000 mg | ORAL_CAPSULE | Freq: Two times a day (BID) | ORAL | Status: DC
  Administered 2016-08-29 – 2016-09-02 (×8): 300 mg via ORAL
  Filled 2016-08-29 (×8): qty 1

## 2016-08-29 MED ORDER — BRIMONIDINE TARTRATE 0.15 % OP SOLN
1.0000 [drp] | Freq: Two times a day (BID) | OPHTHALMIC | Status: DC
  Administered 2016-08-29 – 2016-09-02 (×8): 1 [drp] via OPHTHALMIC
  Filled 2016-08-29: qty 5

## 2016-08-29 MED ORDER — ONDANSETRON HCL 4 MG PO TABS: 4.0000 mg | ORAL_TABLET | Freq: Four times a day (QID) | ORAL | Status: DC | PRN

## 2016-08-29 MED ORDER — INSULIN ASPART 100 UNIT/ML ~~LOC~~ SOLN: 0.0000 [IU] | Freq: Every day | SUBCUTANEOUS | Status: DC

## 2016-08-29 MED ORDER — SENNOSIDES-DOCUSATE SODIUM 8.6-50 MG PO TABS: 1.0000 | ORAL_TABLET | Freq: Every evening | ORAL | Status: DC | PRN

## 2016-08-29 MED ORDER — ASPIRIN EC 81 MG PO TBEC
81.0000 mg | DELAYED_RELEASE_TABLET | Freq: Every day | ORAL | Status: DC
  Administered 2016-08-31 – 2016-09-02 (×3): 81 mg via ORAL
  Filled 2016-08-29 (×6): qty 1

## 2016-08-29 MED ORDER — POLYSACCHARIDE IRON COMPLEX 150 MG PO CAPS
150.0000 mg | ORAL_CAPSULE | Freq: Two times a day (BID) | ORAL | Status: DC
  Administered 2016-08-29 – 2016-09-02 (×8): 150 mg via ORAL
  Filled 2016-08-29 (×9): qty 1

## 2016-08-29 MED ORDER — ONDANSETRON HCL 4 MG/2ML IJ SOLN: 4.0000 mg | Freq: Four times a day (QID) | INTRAMUSCULAR | Status: DC | PRN

## 2016-08-29 MED ORDER — SODIUM CHLORIDE 0.9 % IV SOLN: 250.0000 mL | INTRAVENOUS | Status: DC | PRN

## 2016-08-29 MED ORDER — ALBUTEROL SULFATE (2.5 MG/3ML) 0.083% IN NEBU: 2.5000 mg | INHALATION_SOLUTION | RESPIRATORY_TRACT | Status: DC | PRN

## 2016-08-29 MED ORDER — POTASSIUM CHLORIDE ER 10 MEQ PO TBCR
10.0000 meq | EXTENDED_RELEASE_TABLET | Freq: Every day | ORAL | Status: DC
  Administered 2016-08-31 – 2016-09-02 (×3): 10 meq via ORAL
  Filled 2016-08-29 (×8): qty 1

## 2016-08-29 MED ORDER — ACETAMINOPHEN 650 MG RE SUPP: 650.0000 mg | Freq: Four times a day (QID) | RECTAL | Status: DC | PRN

## 2016-08-29 MED ORDER — LISINOPRIL 20 MG PO TABS
40.0000 mg | ORAL_TABLET | Freq: Every day | ORAL | Status: DC
  Administered 2016-08-29 – 2016-08-31 (×3): 40 mg via ORAL
  Filled 2016-08-29 (×3): qty 2

## 2016-08-29 MED ORDER — SODIUM CHLORIDE 0.9% FLUSH: 3.0000 mL | INTRAVENOUS | Status: DC | PRN

## 2016-08-29 MED ORDER — PRAVASTATIN SODIUM 40 MG PO TABS
40.0000 mg | ORAL_TABLET | Freq: Every day | ORAL | Status: DC
  Administered 2016-08-30 – 2016-09-01 (×3): 40 mg via ORAL
  Filled 2016-08-29 (×3): qty 1

## 2016-08-29 MED ORDER — ENSURE ENLIVE PO LIQD
237.0000 mL | Freq: Two times a day (BID) | ORAL | Status: DC
  Administered 2016-08-30 – 2016-09-02 (×7): 237 mL via ORAL

## 2016-08-29 MED ORDER — PANTOPRAZOLE SODIUM 40 MG PO TBEC
40.0000 mg | DELAYED_RELEASE_TABLET | Freq: Two times a day (BID) | ORAL | Status: DC
  Administered 2016-08-29 – 2016-09-02 (×8): 40 mg via ORAL
  Filled 2016-08-29 (×8): qty 1

## 2016-08-29 MED ORDER — ALLOPURINOL 300 MG PO TABS
300.0000 mg | ORAL_TABLET | Freq: Every day | ORAL | Status: DC
  Administered 2016-08-30 – 2016-09-02 (×4): 300 mg via ORAL
  Filled 2016-08-29 (×5): qty 1

## 2016-08-29 MED ORDER — ASPIRIN 81 MG PO CHEW
324.0000 mg | CHEWABLE_TABLET | Freq: Once | ORAL | Status: AC
  Administered 2016-08-29: 324 mg via ORAL
  Filled 2016-08-29: qty 4

## 2016-08-29 MED ORDER — PILOCARPINE HCL 4 % OP SOLN
1.0000 [drp] | Freq: Four times a day (QID) | OPHTHALMIC | Status: DC
  Administered 2016-08-29 – 2016-09-02 (×15): 1 [drp] via OPHTHALMIC
  Filled 2016-08-29: qty 15

## 2016-08-29 MED ORDER — KETOROLAC TROMETHAMINE 15 MG/ML IJ SOLN: 15.0000 mg | Freq: Four times a day (QID) | INTRAMUSCULAR | Status: DC | PRN

## 2016-08-29 MED ORDER — FUROSEMIDE 10 MG/ML IJ SOLN
40.0000 mg | Freq: Once | INTRAMUSCULAR | Status: AC
  Administered 2016-08-29: 40 mg via INTRAVENOUS
  Filled 2016-08-29: qty 4

## 2016-08-29 MED ORDER — FUROSEMIDE 10 MG/ML IJ SOLN
40.0000 mg | Freq: Two times a day (BID) | INTRAMUSCULAR | Status: DC
  Administered 2016-08-29 – 2016-09-01 (×7): 40 mg via INTRAVENOUS
  Filled 2016-08-29 (×8): qty 4

## 2016-08-29 MED ORDER — DORZOLAMIDE HCL-TIMOLOL MAL 2-0.5 % OP SOLN
1.0000 [drp] | Freq: Two times a day (BID) | OPHTHALMIC | Status: DC
  Administered 2016-08-29 – 2016-09-02 (×8): 1 [drp] via OPHTHALMIC
  Filled 2016-08-29: qty 10

## 2016-08-29 MED ORDER — HYDROCODONE-ACETAMINOPHEN 5-325 MG PO TABS
1.0000 | ORAL_TABLET | ORAL | Status: DC | PRN
  Administered 2016-08-30 (×2): 1 via ORAL
  Filled 2016-08-29 (×2): qty 1

## 2016-08-29 MED ORDER — ENOXAPARIN SODIUM 40 MG/0.4ML ~~LOC~~ SOLN
40.0000 mg | SUBCUTANEOUS | Status: DC
  Administered 2016-08-29 – 2016-09-01 (×4): 40 mg via SUBCUTANEOUS
  Filled 2016-08-29 (×4): qty 0.4

## 2016-08-29 MED ORDER — INSULIN ASPART 100 UNIT/ML ~~LOC~~ SOLN
0.0000 [IU] | Freq: Three times a day (TID) | SUBCUTANEOUS | Status: DC
  Administered 2016-08-30: 1 [IU] via SUBCUTANEOUS
  Administered 2016-08-30: 2 [IU] via SUBCUTANEOUS
  Administered 2016-08-31 – 2016-09-02 (×4): 1 [IU] via SUBCUTANEOUS
  Filled 2016-08-29 (×6): qty 1

## 2016-08-29 NOTE — ED Provider Notes (Signed)
[facility] Emergency Department Provider Note  ____________________________________________   I have reviewed the triage vital signs and the nursing notes.   HISTORY  Chief Complaint Shortness of Breath    HPI [name] is a 83 y.o. female who presents today complaining of multiple different complaints. History is mostly per family. Patient is not in hospice. This has been recommended to her however She is end-stage multiple different departments. She does have a history of cognitive impairment, chronic CHF, carotid disease etc. Patient here for multiple different complaints. First is that she is on 2 L home oxygen they had a go up to 3 L because she has been having increasing short of breath last week. Coincidently, one week ago, they cut her Lasix in half because it seemed like her swelling was doing better. Her bilateral lower extremity swelling is gotten worse in the interim. She is not complaining any chest pain. She is otherwise at her baseline in terms of her mentation. In addition, patient has had no cough or fever. They are also concerned as her left face looks a little bit droopy to them compared to the right, this is been going on for 4 days. They state they want "everything done". The patient herself does not have any complaints with me at this moment she is pleasantly demented in no acute distress.Level 5 chart caveat; no further history available due to patient status. They state she is having trouble burping she is more anxious than normal she is requiring more Ativan to stay calm and she has swelling and to her left upper extremity as well which is more than baseline. Does have a home nurse and offered to put a catheter in and assist with diuresis but he elected to come here for further testing.      Past Medical History:  Diagnosis Date  . Anemia   . Bone disease   . Carotid arterial disease ([place])    a. 08/2014 U/S: Bilateral < 50% stneosis.  Patent vertebrals w/ antegrade flow.   . Cervical spine arthritis ([place])   . Chronic diastolic CHF (congestive heart failure) ([place])    a. echo 2014: EF 55-60%, DD;  b. 08/2014 Echo: EF 55-60%, no RWMA, GR1DD; c. 02/2016 Echo: EF 65-70%, Gr1 DD.  Marland Kitchen Cognitive impairment   . Depression   . Diabetes mellitus without complication ([place])   . Essential hypertension   . GERD (gastroesophageal reflux disease)   . Glaucoma   . Gout   . Hiatal hernia   . History of renal impairment   . Hyperlipidemia   . Hypotension    a. Related to Norvasc - 11/2014 ED visit.  . Multinodular goiter    a. Noted incidentally on CT 07/2012 and carotid U/S 08/2014;  b. Nl TSH 11/2014.  Marland Kitchen Paroxysmal SVT (supraventricular tachycardia) (HCC)    a. on Toprol   . Prolapsed uterus   . Severe aortic stenosis    a. echo 08/2014: EF 55-60%, no RWMA, GR1DD, severe AS, mild AI, Mean gradient (S): 38 mm Hg. Valve area (VTI): 0.78 cm^2., mild-mod MR, LA mildly dilated, atrial septal aneurysm, mild TR;  b. 02/2016 Echo: EF 65-70%, Gr1 DD, sev AS, mild to mod AI, Valve area (VTI): 0.94 cm^2, (Vmax) 0.96 cm^2, (Vmean) 0.93cm^2, mild MR, mildly dil LA, nl RV fxn, PASP 24mmHg.  . Stroke [facility])    a. CT head 07/2014 showed small thalamic infarct  . Vitamin deficiency     Patient Active  Problem List   Diagnosis Date Noted  . Aortic valve stenosis, moderate 05/03/2016  . Enterococcal bacteremia 03/30/2016  . Viral upper respiratory tract infection   . Acute back pain   . Sepsis ([place])   . Elevated troponin   . CAP (community acquired pneumonia) 03/28/2016  . Leg swelling 09/14/2015  . Essential hypertension   . Hyperlipidemia   . Diabetes mellitus without complication ([place])   . Paroxysmal SVT (supraventricular tachycardia) ([place])   . Multinodular goiter   . Carotid arterial disease ([place])   . Hypotension   . NSTEMI (non-ST elevated myocardial infarction) ([place]) 09/10/2014  . Renal failure 08/28/2014  . SVT (supraventricular  tachycardia) ([place]) 12/17/2012  . Diabetes mellitus, type 2 ([place]) 12/17/2012  . SOB (shortness of breath) 12/17/2012  . Essential hypertension, malignant 12/17/2012  . Chronic diastolic CHF (congestive heart failure) ([place]) 12/17/2012    Past Surgical History:  Procedure Laterality Date  . TEE WITHOUT CARDIOVERSION N/A 03/31/2016   Procedure: TRANSESOPHAGEAL ECHOCARDIOGRAM (TEE);  Surgeon: Minna Merritts, MD;  Location: [facility] ORS;  Service: Cardiovascular;  Laterality: N/A;  . TUBAL LIGATION      Prior to Admission medications   Medication Sig Start Date End Date Taking? Authorizing Provider  acetaminophen (TYLENOL) 500 MG tablet Take 500 mg by mouth every 6 (six) hours as needed.    [provider]  allopurinol (ZYLOPRIM) 300 MG tablet Take 300 mg by mouth daily.    [provider]  aspirin 81 MG tablet Take 81 mg by mouth daily.    [provider]  brimonidine (ALPHAGAN P) 0.1 % SOLN Place 1 drop into the right eye 2 (two) times daily.    [provider]  cholecalciferol (VITAMIN D) 1000 UNITS tablet Take 2,000 Units by mouth daily.     [provider]  Dorzolamide HCl-Timolol Mal (COSOPT OP) Place 1 drop into the right eye 2 (two) times daily.     [provider]  doxazosin (CARDURA) 4 MG tablet Take 1 tablet (4 mg total) by mouth daily. 04/19/16   Minna Merritts, MD  feeding supplement, ENSURE ENLIVE, (ENSURE ENLIVE) LIQD Take 237 mLs by mouth 2 (two) times daily between meals. 04/01/16   Fritzi Mandes, MD  fluconazole (DIFLUCAN) 150 MG tablet Take 150 mg by mouth daily.    [provider]  furosemide (LASIX) 20 MG tablet Take 1 tablet (20 mg total) by mouth as needed for edema. Patient taking differently: Take 20 mg by mouth every other day.  04/19/16 05/19/16  Minna Merritts, MD  gabapentin (NEURONTIN) 300 MG capsule Take 300 mg by mouth 2 (two) times daily.    [provider]  glimepiride (AMARYL) 1 MG tablet Take  1 mg by mouth daily with breakfast.    [provider]  hydrALAZINE (APRESOLINE) 25 MG tablet Take 1 tablet (25 mg total) by mouth 3 (three) times daily as needed. 04/19/16   Minna Merritts, MD  iron polysaccharides (NIFEREX) 150 MG capsule Take 150 mg by mouth 2 (two) times daily.    [provider]  isosorbide mononitrate (IMDUR) 60 MG 24 hr tablet Take 1 tablet (60 mg total) by mouth 2 (two) times daily. Patient taking differently: Take 30 mg by mouth 2 (two) times daily.  12/31/15 12/30/16  Minna Merritts, MD  latanoprost (XALATAN) 0.005 % ophthalmic solution Place 1 drop into the right eye at bedtime. 07/03/14   [provider]  lidocaine (LIDODERM) 5 % Place  1 patch onto the skin daily. Remove & Discard patch within 12 hours or as directed by MD 04/01/16   Fritzi Mandes, MD  lisinopril (PRINIVIL,ZESTRIL) 40 MG tablet Take 40 mg by mouth at bedtime.    [provider]  LORazepam (ATIVAN) 1 MG tablet Take 1 tablet (1 mg total) by mouth 2 (two) times daily. 04/25/16 04/25/17  Earleen Newport, MD  lovastatin (MEVACOR) 40 MG tablet Take 40 mg by mouth at bedtime.    [provider]  metoprolol succinate (TOPROL-XL) 25 MG 24 hr tablet Take 3 tablets (75 mg total) by mouth daily. Take with or immediately following a meal. 09/12/14   Vaughan Basta, MD  pantoprazole (PROTONIX) 40 MG tablet Take 40 mg by mouth 2 (two) times daily.    [provider]  pilocarpine (PILOCAR) 4 % ophthalmic solution Place 1 drop into the right eye 4 (four) times daily.     [provider]  potassium chloride (K-DUR) 10 MEQ tablet Take 1 tablet (10 mEq total) by mouth daily. Take with lasix Patient taking differently: Take 10 mEq by mouth every other day. Take with lasix 03/23/16 05/02/16  Minna Merritts, MD  tiZANidine (ZANAFLEX) 4 MG tablet Take 1 tablet by mouth 3 (three) times daily as needed. 08/08/14   [provider]  traMADol (ULTRAM) 50 MG  tablet Take 1 tablet (50 mg total) by mouth every 6 (six) hours as needed for moderate pain. Patient not taking: Reported on 07/26/2016 04/01/16   Fritzi Mandes, MD    Allergies Metformin and related  Family History  Problem Relation Age of Onset  . Glaucoma Mother   . Diabetes Mother   . Peptic Ulcer Father   . Colitis Father     Social History Social History  Substance Use Topics  . Smoking status: Never Smoker  . Smokeless tobacco: Never Used  . Alcohol use No    Review of Systems Constitutional: No fever/chills Eyes: No visual changes. ENT: No sore throat. No stiff neck no neck pain Cardiovascular: Denies chest pain. Respiratory: Positive shortness of breath. Gastrointestinal:   no vomiting.  No diarrhea.  No constipation. Genitourinary: Negative for dysuria. Musculoskeletal: Positive lower extremity swelling Skin: Negative for rash. Neurological: Negative for severe headaches, focal weakness or numbness.   ____________________________________________   PHYSICAL EXAM:  VITAL SIGNS: ED Triage Vitals  Enc Vitals Group     BP 08/29/16 1550 (!) 137/32     Pulse Rate 08/29/16 1550 70     Resp 08/29/16 1550 16     Temp 08/29/16 1550 98.6 F (37 C)     Temp Source 08/29/16 1550 Oral     SpO2 08/29/16 1550 93 %     Weight 08/29/16 1545 140 lb (63.5 kg)     Height 08/29/16 1545 5' (1.524 m)     Head Circumference --      Peak Flow --      Pain Score --      Pain Loc --      Pain Edu? --      Excl. in [place]? --     Constitutional: Alert and Pleasantly demented no acute distress follows commands knows her name unsure of the date of the ill-appearing.  Eyes: Conjunctivae are normal Head: Atraumatic HEENT: No congestion/rhinnorhea. Mucous membranes are moist.  Oropharynx non-erythematous Neck:   Nontender with no meningismus, no masses, no stridor Cardiovascular: Normal rate, regular rhythm. Grossly normal heart sounds.  Good peripheral circulation. Respiratory:  Normal respiratory effort.  No retractions. Lungs show decreased breath sounds with occasional rales Abdominal: Soft and nontender. No distention. No guarding no rebound Back:  There is no focal tenderness or step off.  there is no midline tenderness there are no lesions noted. there is no CVA tenderness Musculoskeletal: No lower extremity tenderness, no upper extremity tenderness. The left upper shoulder with swelling left lower extremity swelling, both of those are worse than right. Very strong pulses noted however show his are warm and well perfused Neurologic: She is awake and alert, very limited neurologic exam given patient compliance with exam, she has what appears to be possible weakness in the left upper extremity with this could be limited by edema, she has a possible slight facial droop on the left, speech does not appear to be markedly impaired but of course patient does not speak much, very limited neurologic exam Skin:  Skin is warm, dry and intact. No rash noted. Psychiatric: Mood and affect are normal. Speech and behavior are normal.  ____________________________________________   LABS (all labs ordered are listed, but only abnormal results are displayed)  Labs Reviewed  BASIC METABOLIC PANEL - Abnormal; Notable for the following:       Result Value   Potassium 3.1 (*)    Chloride 100 (*)    All other components within normal limits  CBC - Abnormal; Notable for the following:    RBC 3.47 (*)    Hemoglobin 10.3 (*)    HCT 31.3 (*)    RDW 17.4 (*)    All other components within normal limits  TROPONIN I - Abnormal; Notable for the following:    Troponin I 0.07 (*)    All other components within normal limits  BRAIN NATRIURETIC PEPTIDE   ____________________________________________  EKG  I personally interpreted any EKGs ordered by me or triage Sinus rhythm rate 66 bpm yes elevation or depression, LAD noted, nonspecific ST changes  noted ____________________________________________  RADIOLOGY  I reviewed any imaging ordered by me or triage that were performed during my shift and, if possible, patient and/or family made aware of any abnormal findings. ____________________________________________   PROCEDURES  Procedure(s) performed: None  Procedures  Critical Care performed: None  ____________________________________________   INITIAL IMPRESSION / ASSESSMENT AND PLAN / ED COURSE  Pertinent labs & imaging results that were available during my care of the patient were reviewed by me and considered in my medical decision making (see chart for details).  Patient here with what appears to be decompensated CHF, other possible entities that have been suggested by the family are stroke, patient does seem to have some degree of facial asymmetry which is been there for 4 days we'll obtain a CT scan of her head she can lie flat. Patient's troponin is borderline however she has no chest pain, we will give her diuresis as suspect this is related to demand from her untreated CHF. All of her symptoms seem to have started when I went lower on her Lasix about a week ago. We'll place a Foley and diuresis her. We will obtain ultrasound as she has asymmetric swelling of the left upper and left lower extremity, we will admit her to the hospitalist service.    ____________________________________________   FINAL CLINICAL IMPRESSION(S) / ED DIAGNOSES  Final diagnoses:  None      This chart was dictated using voice recognition software.  Despite best efforts to proofread,  errors can occur which can change meaning.  Schuyler Amor, MD 08/29/16 [phone]

## 2016-08-29 NOTE — ED Notes (Signed)
Date and time results received: 08/29/16 5:12 PM  Test: Troponin Critical Value: 0.07  Name of Provider Notified: Dr. Burlene Arnt  Orders Received? Or Actions Taken?: acknowledged

## 2016-08-29 NOTE — H&P (Addendum)
[facility] [place] NAME: [name]    MR#:  [id number]  DATE OF BIRTH:  12/18/1924  DATE OF ADMISSION:  08/29/2016  PRIMARY CARE PHYSICIAN: Tegeler, Loma Sousa, MD   REQUESTING/REFERRING PHYSICIAN: Schuyler Amor, MD  CHIEF COMPLAINT:   Chief Complaint  Patient presents with  . Shortness of Breath   Worsening shortness rest of neck swelling for 1 week HISTORY OF PRESENT ILLNESS:  [name]  is a 83 y.o. female with a known history ofChronic respiratory failure on home oxygen 2 L, Chronic diastolic CHF, carotid arterial disease, cognitive impairment, cervical spine arthritis, anemia, hypertension, diabetes and hyperlipidemia. She is awake but not good historian. Per family, She has had worsening shortness breath and leg swelling for the past one week. Her Lasix was decreased one week ago. She also had some left arm swelling Chest x-ray show CHF. The patient's family also mentioned that patient has had little facial droop on the right side for 4 days. 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  PAST MEDICAL HISTORY:   Past Medical History:  Diagnosis Date  . Anemia   . Bone disease   . Carotid arterial disease ([place])    a. 08/2014 U/S: Bilateral < 50% stneosis. Patent vertebrals w/ antegrade flow.   . Cervical spine arthritis ([place])   . Chronic diastolic CHF (congestive heart failure) ([place])    a. echo 2014: EF 55-60%, DD;  b. 08/2014 Echo: EF 55-60%, no RWMA, GR1DD; c. 02/2016 Echo: EF 65-70%, Gr1 DD.  Marland Kitchen Cognitive impairment   . Depression   . Diabetes mellitus without complication ([place])   . Essential hypertension   . GERD (gastroesophageal reflux disease)   . Glaucoma   . Gout   . Hiatal hernia   . History of renal impairment   . Hyperlipidemia   . Hypotension    a. Related to Norvasc - 11/2014 ED visit.  . Multinodular goiter    a. Noted incidentally on CT 07/2012 and carotid U/S 08/2014;  b. Nl TSH 11/2014.  Marland Kitchen Paroxysmal  SVT (supraventricular tachycardia) (HCC)    a. on Toprol   . Prolapsed uterus   . Severe aortic stenosis    a. echo 08/2014: EF 55-60%, no RWMA, GR1DD, severe AS, mild AI, Mean gradient (S): 38 mm Hg. Valve area (VTI): 0.78 cm^2., mild-mod MR, LA mildly dilated, atrial septal aneurysm, mild TR;  b. 02/2016 Echo: EF 65-70%, Gr1 DD, sev AS, mild to mod AI, Valve area (VTI): 0.94 cm^2, (Vmax) 0.96 cm^2, (Vmean) 0.93cm^2, mild MR, mildly dil LA, nl RV fxn, PASP 28mmHg.  . Stroke [facility])    a. CT head 07/2014 showed small thalamic infarct  . Vitamin deficiency     PAST SURGICAL HISTORY:   Past Surgical History:  Procedure Laterality Date  . TEE WITHOUT CARDIOVERSION N/A 03/31/2016   Procedure: TRANSESOPHAGEAL ECHOCARDIOGRAM (TEE);  Surgeon: Minna Merritts, MD;  Location: [facility] ORS;  Service: Cardiovascular;  Laterality: N/A;  . TUBAL LIGATION      SOCIAL HISTORY:   Social History  Substance Use Topics  . Smoking status: Never Smoker  . Smokeless tobacco: Never Used  . Alcohol use No    FAMILY HISTORY:   Family History  Problem Relation Age of Onset  . Glaucoma Mother   . Diabetes Mother   . Peptic Ulcer Father   . Colitis Father     DRUG ALLERGIES:   Allergies  Allergen Reactions  .  Metformin And Related     REVIEW OF SYSTEMS:   Review of Systems  Constitutional: Positive for malaise/fatigue. Negative for chills and fever.  HENT: Negative for sore throat.   Eyes: Negative for blurred vision and double vision.  Respiratory: Positive for cough and shortness of breath. Negative for stridor.   Cardiovascular: Positive for leg swelling. Negative for chest pain and palpitations.  Gastrointestinal: Negative for abdominal pain, blood in stool, diarrhea, melena, nausea and vomiting.  Genitourinary: Negative for dysuria and hematuria.  Musculoskeletal: Negative for back pain.  Neurological: Positive for weakness. Negative for dizziness, focal weakness, loss of consciousness and  headaches.  Psychiatric/Behavioral: Negative for depression. The patient is not nervous/anxious.     MEDICATIONS AT HOME:   Prior to Admission medications   Medication Sig Start Date End Date Taking? Authorizing Provider  allopurinol (ZYLOPRIM) 300 MG tablet Take 300 mg by mouth daily.   Yes [provider]  brimonidine (ALPHAGAN P) 0.1 % SOLN Place 1 drop into the right eye 2 (two) times daily.   Yes [provider]  Cholecalciferol (VITAMIN D) 2000 units CAPS Take 2,000 Units by mouth daily.    Yes [provider]  Dorzolamide HCl-Timolol Mal (COSOPT OP) Place 1 drop into the right eye 2 (two) times daily.    Yes [provider]  gabapentin (NEURONTIN) 300 MG capsule Take 300 mg by mouth 2 (two) times daily.   Yes [provider]  glimepiride (AMARYL) 1 MG tablet Take 1 mg by mouth daily with breakfast.   Yes [provider]  iron polysaccharides (NIFEREX) 150 MG capsule Take 150 mg by mouth 2 (two) times daily.   Yes [provider]  isosorbide mononitrate (IMDUR) 60 MG 24 hr tablet Take 1 tablet (60 mg total) by mouth 2 (two) times daily. Patient taking differently: Take 30 mg by mouth 2 (two) times daily.  12/31/15 12/30/16 Yes Gollan, Kathlene November, MD  latanoprost (XALATAN) 0.005 % ophthalmic solution Place 1 drop into the right eye at bedtime. 07/03/14  Yes [provider]  lisinopril (PRINIVIL,ZESTRIL) 40 MG tablet Take 40 mg by mouth at bedtime.   Yes [provider]  lovastatin (MEVACOR) 40 MG tablet Take 40 mg by mouth at bedtime.   Yes [provider]  metoprolol succinate (TOPROL-XL) 25 MG 24 hr tablet Take 3 tablets (75 mg total) by mouth daily. Take with or immediately following a meal. 09/12/14  Yes Vaughan Basta, MD  pantoprazole (PROTONIX) 40 MG tablet Take 40 mg by mouth 2 (two) times daily.   Yes [provider]  pilocarpine (PILOCAR) 4 % ophthalmic solution Place 1 drop into  the right eye 4 (four) times daily.    Yes [provider]  potassium chloride (K-DUR) 10 MEQ tablet Take 1 tablet (10 mEq total) by mouth daily. Take with lasix 03/23/16 08/29/16 Yes Gollan, Kathlene November, MD  traZODone (DESYREL) 100 MG tablet Take 100 mg by mouth at bedtime.   Yes [provider]  acetaminophen (TYLENOL) 500 MG tablet Take 500 mg by mouth every 6 (six) hours as needed.    [provider]  aspirin 81 MG tablet Take 81 mg by mouth daily.    [provider]  doxazosin (CARDURA) 4 MG tablet Take 1 tablet (4 mg total) by mouth daily. 04/19/16   Minna Merritts, MD  feeding supplement, ENSURE ENLIVE, (ENSURE ENLIVE) LIQD Take 237 mLs by mouth 2 (two) times daily between meals. 04/01/16  Fritzi Mandes, MD  furosemide (LASIX) 20 MG tablet Take 1 tablet (20 mg total) by mouth as needed for edema. Patient taking differently: Take 40 mg by mouth daily.  04/19/16 05/19/16  Minna Merritts, MD  hydrALAZINE (APRESOLINE) 25 MG tablet Take 1 tablet (25 mg total) by mouth 3 (three) times daily as needed. 04/19/16   Minna Merritts, MD  lidocaine (LIDODERM) 5 % Place 1 patch onto the skin daily. Remove & Discard patch within 12 hours or as directed by MD 04/01/16   Fritzi Mandes, MD  LORazepam (ATIVAN) 1 MG tablet Take 1 tablet (1 mg total) by mouth 2 (two) times daily. 04/25/16 04/25/17  Earleen Newport, MD  tiZANidine (ZANAFLEX) 4 MG tablet Take 1 tablet by mouth 3 (three) times daily as needed. 08/08/14   [provider]  traMADol (ULTRAM) 50 MG tablet Take 1 tablet (50 mg total) by mouth every 6 (six) hours as needed for moderate pain. Patient not taking: Reported on 07/26/2016 04/01/16   Fritzi Mandes, MD      VITAL SIGNS:  Blood pressure (!) 137/32, pulse 70, temperature 98.6 F (37 C), temperature source Oral, resp. rate 16, height 5' (1.524 m), weight 140 lb (63.5 kg), SpO2 93 %.  PHYSICAL EXAMINATION:  Physical Exam  GENERAL:  83 y.o.-year-old patient  lying in the bed with no acute distress.  EYES: Pupils equal, round, reactive to light and accommodation. No scleral icterus. Extraocular muscles intact.  HEENT: Head atraumatic, normocephalic. Oropharynx and nasopharynx clear.  NECK:  Supple, no jugular venous distention. No thyroid enlargement, no tenderness.  LUNGS: Normal breath sounds bilaterally, no wheezing, but has  rales. No use of accessory muscles of respiration.  CARDIOVASCULAR: S1, S2 normal. No murmurs, rubs, or gallops.  ABDOMEN: Soft, nontender, nondistended. Bowel sounds present. No organomegaly or mass.  EXTREMITIES: bilalateral leg edema 2+, no cyanosis, or clubbing.  NEUROLOGIC: Cranial nerves II through XII are intact. Muscle strength 5/5 in all extremities. Sensation intact. Gait not checked.  PSYCHIATRIC: The patient is alert and oriented x 3.  SKIN: No obvious rash, lesion, or ulcer.   LABORATORY PANEL:   CBC  Recent Labs Lab 08/29/16 1556  WBC 7.6  HGB 10.3*  HCT 31.3*  PLT 193   ------------------------------------------------------------------------------------------------------------------  Chemistries   Recent Labs Lab 08/29/16 1556  NA 140  K 3.1*  CL 100*  CO2 31  GLUCOSE 87  BUN 19  CREATININE 0.62  CALCIUM 9.5   ------------------------------------------------------------------------------------------------------------------  Cardiac Enzymes  Recent Labs Lab 08/29/16 1556  TROPONINI 0.07*   ------------------------------------------------------------------------------------------------------------------  RADIOLOGY:  Dg Chest 2 View  Result Date: 08/29/2016 CLINICAL DATA:  Left chest pain EXAM: CHEST  2 VIEW COMPARISON:  May 02, 2016 FINDINGS: The heart size is enlarged. The mediastinal contour is stable. There is interstitial pulmonary edema. Small bilateral pleural effusions are identified. The visualized skeletal structures are stable. IMPRESSION: Mild congestive heart failure.   Small bilateral pleural effusions. Electronically Signed   By: Abelardo Diesel M.D.   On: 08/29/2016 16:34      IMPRESSION AND PLAN:   Acute On chronic respiratory failure with hypoxia due to Acute on chronic diastolic CHF. The patient will be admitted to medical floor. Telemetry monitor, start CHF protocol, Lasix 40 mg IV twice a day. Venous ultrasound of her left arm and leg didn't show any DVT.   Hypokalemia. Give potassium supplement, follow-up potassium and magnesium level.  Elevated troponin. Due to demanding ischemia. Start aspirin and continue  statin.  Hypertension. Continue home hypertension medication. Hold if low blood pressure.  Diabetes. start sliding scale.  All the records are reviewed and case discussed with ED provider. Management plans discussed with the patient, her 3 daughters and they are in agreement.  CODE STATUS: full code.  TOTAL TIME TAKING CARE OF THIS PATIENT: 53 minutes.    Demetrios Loll M.D on 08/29/2016 at 5:51 PM  Between 7am to 6pm - Pager - [phone]  After 6pm go to [email or web address] - Proofreader  [facility]  Office  [phone]  CC: Primary care physician; Tegeler, Loma Sousa, MD   Note: This dictation was prepared with Dragon dictation along with smaller phrase technology. Any transcriptional errors that result from this process are unintentional.

## 2016-08-29 NOTE — Progress Notes (Signed)
Advanced Care Plan.  Purpose of Encounter: Code status Parties in Attendance: the patient, her 3 daughters and me. Patient's Decisional Capacity: yes. Medical Story: [name]  is a 83 y.o. female with a known history ofChronic respiratory failure on home oxygen 2 L, Chronic diastolic CHF, carotid arterial disease, cognitive impairment, cervical spine arthritis, anemia, hypertension, diabetes and hyperlipidemia. She is being admitted for Acute On chronic respiratory failure with hypoxia due to Acute on chronic diastolic CHF. She has multiple medical diseases. But her daughters want aggressive treatment. I discussed about code status. The patient and her daughters want FULL CODE. Plan:  Code Status: FULL CODE.  Time spent discussing advance care planning: 18 minutes.

## 2016-08-29 NOTE — ED Notes (Signed)
PO meds given crushed in applesauce.

## 2016-08-29 NOTE — ED Triage Notes (Signed)
Patient arrives with daughter who states patient has had left side pain and weakness since Saturday, and SOB x 1 week.  Reports patient being restless and giving patient Ativan to help.  Also patient has intermittently having lower extremity swelling.  Patient is a hospice patient, but patient's daughter states patient does not want to "give up yet" and wants to "get better".

## 2016-08-30 DIAGNOSIS — R0603 Acute respiratory distress: Secondary | ICD-10-CM

## 2016-08-30 DIAGNOSIS — D649 Anemia, unspecified: Secondary | ICD-10-CM | POA: Diagnosis present

## 2016-08-30 DIAGNOSIS — R748 Abnormal levels of other serum enzymes: Secondary | ICD-10-CM

## 2016-08-30 DIAGNOSIS — I350 Nonrheumatic aortic (valve) stenosis: Secondary | ICD-10-CM

## 2016-08-30 DIAGNOSIS — E46 Unspecified protein-calorie malnutrition: Secondary | ICD-10-CM | POA: Diagnosis present

## 2016-08-30 DIAGNOSIS — I5033 Acute on chronic diastolic (congestive) heart failure: Secondary | ICD-10-CM

## 2016-08-30 LAB — BASIC METABOLIC PANEL
Anion gap: 4 — ABNORMAL LOW (ref 5–15)
BUN: 17 mg/dL (ref 6–20)
CALCIUM: 9 mg/dL (ref 8.9–10.3)
CHLORIDE: 103 mmol/L (ref 101–111)
CO2: 36 mmol/L — AB (ref 22–32)
CREATININE: 0.61 mg/dL (ref 0.44–1.00)
GFR calc Af Amer: >60 mL/min (ref >60)
GFR calc non Af Amer: >60 mL/min (ref >60)
GLUCOSE: 67 mg/dL (ref 65–99)
Potassium: 3.5 mmol/L (ref 3.5–5.1)
Sodium: 143 mmol/L (ref 135–145)

## 2016-08-30 LAB — TSH: TSH: 0.689 u[IU]/mL (ref 0.350–4.500)

## 2016-08-30 LAB — GLUCOSE, CAPILLARY
GLUCOSE-CAPILLARY: 121 mg/dL — AB (ref 65–99)
Glucose-Capillary: 62 mg/dL — ABNORMAL LOW (ref 65–99)
Glucose-Capillary: 114 mg/dL — ABNORMAL HIGH (ref 65–99)
Glucose-Capillary: 156 mg/dL — ABNORMAL HIGH (ref 65–99)
Glucose-Capillary: 135 mg/dL — ABNORMAL HIGH (ref 65–99)

## 2016-08-30 LAB — HEPATIC FUNCTION PANEL
ALT: 20 U/L (ref 14–54)
AST: 25 U/L (ref 15–41)
Albumin: 3 g/dL — ABNORMAL LOW (ref 3.5–5.0)
Alkaline Phosphatase: 30 U/L — ABNORMAL LOW (ref 38–126)
BILIRUBIN DIRECT: 0.2 mg/dL (ref 0.1–0.5)
BILIRUBIN INDIRECT: 0.7 mg/dL (ref 0.3–0.9)
BILIRUBIN TOTAL: 0.9 mg/dL (ref 0.3–1.2)
Total Protein: 5.4 g/dL — ABNORMAL LOW (ref 6.5–8.1)

## 2016-08-30 MED ORDER — METOPROLOL SUCCINATE ER 25 MG PO TB24
25.0000 mg | ORAL_TABLET | Freq: Every day | ORAL | Status: DC
  Administered 2016-09-01 – 2016-09-02 (×2): 25 mg via ORAL
  Filled 2016-08-30 (×2): qty 1

## 2016-08-30 MED ORDER — MAGNESIUM SULFATE 2 GM/50ML IV SOLN
2.0000 g | Freq: Once | INTRAVENOUS | Status: AC
Start: 2016-08-30 — End: 2016-08-30
  Administered 2016-08-30: 2 g via INTRAVENOUS
  Filled 2016-08-30: qty 50

## 2016-08-30 NOTE — Progress Notes (Signed)
Rounded with MD at bedside, patient presently resting in the bed, alert to self, family at bedside, family updated about POC, foley in place and patent, PT^/OT / and swallow  eval pending, will continue to monitor.

## 2016-08-30 NOTE — Progress Notes (Signed)
Patient's daughter states that pt is having a "hard time swallowing" she will like for pt to be evaluated again to see if "nodules in goiter are bigger" and causing pt "problems with burping and swallowing"   Daughter states that pt is on Metoprolol at home, will like to know the reason why she is not on metoprolol here.

## 2016-08-30 NOTE — Care Management (Signed)
Admitted to this facility with the diagnosis of congestive heart failure. Lives alone, but the 6 daughters rotate staying with their mother. Daughter, [name], [phone]), Last seen Dr. Sherry Ruffing in [place] 04/11/16. Prescriptions are filled at [facility] in [place].  Followed by [facility] x 1 month. Update to Flo Shanks, RN representative for [facility]. Hospital bed, oxygen,  ramp, lift chair, and raised toilet seat in the home.  Followed by [facility] in the past for long term antibiotics. No falls. Fair appetite. Lost 4 pounds weight.  Up and very active prior to getting sick and needing Hospice services Shelbie Ammons RN MSN CCM Care Management [phone]

## 2016-08-30 NOTE — Evaluation (Addendum)
Clinical/Bedside Swallow Evaluation Patient Details  Name: [name] MRN: [id number] Date of Birth: 07-May-1924  Today's Date: 08/30/2016 Time: SLP Start Time (ACUTE ONLY): 1400 SLP Stop Time (ACUTE ONLY): 1500 SLP Time Calculation (min) (ACUTE ONLY): 60 min  Past Medical History:  Past Medical History:  Diagnosis Date  . Anemia   . Bone disease   . Carotid arterial disease ([place])    a. 08/2014 U/S: Bilateral < 50% stneosis. Patent vertebrals w/ antegrade flow.   . Cervical spine arthritis ([place])   . Chronic diastolic CHF (congestive heart failure) ([place])    a. echo 2014: EF 55-60%, DD;  b. 08/2014 Echo: EF 55-60%, no RWMA, GR1DD; c. 02/2016 Echo: EF 65-70%, Gr1 DD.  Marland Kitchen Cognitive impairment   . Depression   . Diabetes mellitus without complication ([place])   . Essential hypertension   . GERD (gastroesophageal reflux disease)   . Glaucoma   . Gout   . Hiatal hernia   . History of renal impairment   . Hyperlipidemia   . Hypotension    a. Related to Norvasc - 11/2014 ED visit.  . Multinodular goiter    a. Noted incidentally on CT 07/2012 and carotid U/S 08/2014;  b. Nl TSH 11/2014.  Marland Kitchen Paroxysmal SVT (supraventricular tachycardia) ([facility])    a. on Toprol   . Prolapsed uterus   . Severe aortic stenosis    a. echo 08/2014: EF 55-60%, no RWMA, GR1DD, severe AS, mild AI, Mean gradient (S): 38 mm Hg. Valve area (VTI): 0.78 cm^2., mild-mod MR, LA mildly dilated, atrial septal aneurysm, mild TR;  b. 02/2016 Echo: EF 65-70%, Gr1 DD, sev AS, mild to mod AI, Valve area (VTI): 0.94 cm^2, (Vmax) 0.96 cm^2, (Vmean) 0.93cm^2, mild MR, mildly dil LA, nl RV fxn, PASP 17mmHg.  . Stroke [facility])    a. CT head 07/2014 showed small thalamic infarct  . Vitamin deficiency    Past Surgical History:  Past Surgical History:  Procedure Laterality Date  . TEE WITHOUT CARDIOVERSION N/A 03/31/2016   Procedure: TRANSESOPHAGEAL ECHOCARDIOGRAM (TEE);  Surgeon: Minna Merritts, MD;  Location: [facility] ORS;  Service:  Cardiovascular;  Laterality: N/A;  . TUBAL LIGATION     HPI:   Pt is a 83 y.o. female with a known history of Cognitive Impairment per chart, chronic respiratory failure on home oxygen 2 L, Chronic diastolic CHF, Hiatal Hernia, GERD, carotid arterial disease, cognitive impairment, cervical spine arthritis, anemia, hypertension, diabetes and hyperlipidemia. She is awake but not good historian. Per family, She has had worsening shortness breath and leg swelling for the past one week. Her Lasix was decreased one week ago. She also had some left arm swelling Chest x-ray show CHF. Pt has been eating and drinking w/ no overt s/s of aspiration per Daughter present in room.   Assessment / Plan / Recommendation Clinical Impression  Pt appears at reduced risk for aspiration when following general aspiration precautions; pt does appear to have oral phase challenges d/t Edentulous status. Pt would be best served w/ a modified food consistency diet w/ thin liquids w/ aspiration precautions; monitoring at meals for support. Pt does have min oral phase deficits c/b lengthier oral time for bolus management and clearing but given time, clearing appears adequate. Pt consumed po trials including thin liquids w/ no overt s/s of aspiration noted.  Pt does require support at meals d/t Cognitive decline (baseline), and she is at risk for Esophageal dysmotility w/ Reflux d/t GERD and Hiatal Hernia (baseline).  SLP Visit Diagnosis: Dysphagia, oral phase (R13.11);Dysphagia, pharyngoesophageal phase (R13.14)    Aspiration Risk   (reduced following precautions; Esophageal risk)    Diet Recommendation  Dysphagia level 2 (minced w/ gravy added to moisten); Thin liquids. Aspiration precautions; Reflux precautions. Monitoring at meals.  Medication Administration: Whole meds with puree    Other  Recommendations Recommended Consults: Consider GI evaluation (for management; education) Oral Care Recommendations: Oral care  BID;Patient independent with oral care;Staff/trained caregiver to provide oral care   Follow up Recommendations None      Frequency and Duration min 2x/week  1 week       Prognosis Prognosis for Safe Diet Advancement: Fair Barriers to Reach Goals: Severity of deficits (baseline)      Swallow Study   General Date of Onset: 08/29/16 Type of Study: Bedside Swallow Evaluation Previous Swallow Assessment: none recent Diet Prior to this Study: Regular;Thin liquids (as ordered) Temperature Spikes Noted: No (wbc 7.6) Respiratory Status: Nasal cannula (2-3 liters) History of Recent Intubation: No Behavior/Cognition: Alert;Cooperative;Pleasant mood;Distractible;Requires cueing Oral Cavity Assessment: Within Functional Limits Oral Care Completed by SLP: Recent completion by staff Oral Cavity - Dentition: Edentulous (does not wear her dentures per report) Vision: Functional for self-feeding Self-Feeding Abilities: Able to feed self;Needs assist;Needs set up (not using her LUE) Patient Positioning: Upright in chair Baseline Vocal Quality: Low vocal intensity Volitional Cough: Strong Volitional Swallow: Able to elicit    Oral/Motor/Sensory Function Overall Oral Motor/Sensory Function: Within functional limits   Ice Chips Ice chips: Not tested   Thin Liquid Thin Liquid: Within functional limits Presentation: Self Fed;Straw (8+ ozs)    Nectar Thick Nectar Thick Liquid: Not tested   Honey Thick Honey Thick Liquid: Not tested   Puree Puree: Within functional limits Presentation: Self Fed;Spoon (assisted; 3 ozs)   Solid   GO   Solid: Not tested Other Comments: edentulous; some stated difficulty w/ solids at home per Daughter         Orinda Kenner, MS, CCC-SLP Watson,Katherine 08/30/2016,5:25 PM

## 2016-08-30 NOTE — Progress Notes (Signed)
Subjective: Patient feels a little weak. No chest pain. No shortness of breath.  Objective: Vital signs in last 24 hours: Temp:  [97.6 F (36.4 C)-98.6 F (37 C)] 97.8 F (36.6 C) (07/03 0755) Pulse Rate:  [70-81] 71 (07/03 0755) Resp:  [16-33] 18 (07/03 0755) BP: (137-153)/(32-50) 138/35 (07/03 0755) SpO2:  [93 %-100 %] 100 % (07/03 0755) Weight:  [63.5 kg (140 lb)-67.9 kg (149 lb 10.3 oz)] 67.9 kg (149 lb 10.3 oz) (07/03 0602) Weight change:  Last BM Date: 07/30/16  Intake/Output from previous day: 07/02 0701 - 07/03 0700 In: -  Out: 2000 [Urine:2000] Intake/Output this shift: No intake/output data recorded.  General appearance: alert Head: Normocephalic, without obvious abnormality, atraumatic Resp: Mild bibasilar crackles Cardio: regular rate and rhythm, S1, S2 normal, no murmur, click, rub or gallop GI: soft, non-tender; bowel sounds normal; no masses,  no organomegaly Neurologic: Grossly normal  Lab Results:  Recent Labs  08/29/16 1556  WBC 7.6  HGB 10.3*  HCT 31.3*  PLT 193   BMET  Recent Labs  08/29/16 1556 08/30/16 0512  NA 140 143  K 3.1* 3.5  CL 100* 103  CO2 31 36*  GLUCOSE 87 67  BUN 19 17  CREATININE 0.62 0.61  CALCIUM 9.5 9.0    Studies/Results: Dg Chest 2 View  Result Date: 08/29/2016 CLINICAL DATA:  Left chest pain EXAM: CHEST  2 VIEW COMPARISON:  May 02, 2016 FINDINGS: The heart size is enlarged. The mediastinal contour is stable. There is interstitial pulmonary edema. Small bilateral pleural effusions are identified. The visualized skeletal structures are stable. IMPRESSION: Mild congestive heart failure.  Small bilateral pleural effusions. Electronically Signed   By: Abelardo Diesel M.D.   On: 08/29/2016 16:34   Ct Head Wo Contrast  Result Date: 08/29/2016 CLINICAL DATA:  83 year old hypertensive female with left-sided pain and weakness for the past 2 days. Initial encounter. EXAM: CT HEAD WITHOUT CONTRAST TECHNIQUE: Contiguous axial  images were obtained from the base of the skull through the vertex without intravenous contrast. COMPARISON:  07/16/2015 and 08/28/2014 CT. FINDINGS: Brain: No intracranial hemorrhage or CT evidence of large acute infarct. Remote anterior right thalamic infarct and remote small right cerebellar infarct. Chronic microvascular changes. Global atrophy without hydrocephalus. No intracranial mass lesion noted on this unenhanced exam. Expanded partially empty sella without change. Vascular: Vascular calcifications. Skull: No acute abnormality. Sinuses/Orbits: No acute orbital abnormality. Visualized paranasal sinuses are clear. Other: Mastoid air cells and middle ear cavities are clear. IMPRESSION: No intracranial hemorrhage or CT evidence of large acute infarct. 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 Chronic microvascular changes. Global atrophy. Electronically Signed   By: Genia Del M.D.   On: 08/29/2016 17:54   US Venous Img Lower Unilateral Left  Result Date: 08/29/2016 CLINICAL DATA:  83 year old female with swelling for the past 3 days. Initial encounter. EXAM: Left LOWER EXTREMITY VENOUS DOPPLER ULTRASOUND TECHNIQUE: Gray-scale sonography with graded compression, as well as color Doppler and duplex ultrasound were performed to evaluate the lower extremity deep venous systems from the level of the common femoral vein and including the common femoral, femoral, profunda femoral, popliteal and calf veins including the posterior tibial, peroneal and gastrocnemius veins when visible. Spectral Doppler was utilized to evaluate flow at rest and with distal augmentation maneuvers in the common femoral, femoral and popliteal veins. COMPARISON:  None. FINDINGS: Contralateral Common Femoral Vein: Respiratory phasicity is normal and symmetric with the symptomatic side. No evidence of thrombus. Normal compressibility. Common  Femoral Vein: No evidence of thrombus. Normal compressibility, respiratory  phasicity and response to augmentation. Saphenofemoral Junction: No evidence of thrombus. Normal compressibility and flow on color Doppler imaging. Profunda Femoral Vein: No evidence of thrombus. Normal compressibility and flow on color Doppler imaging. Femoral Vein: No evidence of thrombus. Normal compressibility, respiratory phasicity and response to augmentation. Popliteal Vein: No evidence of thrombus. Normal compressibility, respiratory phasicity and response to augmentation. Calf Veins: No evidence of thrombus. Normal compressibility and flow on color Doppler imaging. Other Findings:  Edema calf level. IMPRESSION: No evidence of DVT within the left lower extremity. Electronically Signed   By: Genia Del M.D.   On: 08/29/2016 18:49   US Venous Img Upper Uni Left  Result Date: 08/29/2016 CLINICAL DATA:  Left extremity swelling for a 3 days EXAM: LEFT UPPER EXTREMITY VENOUS DOPPLER ULTRASOUND TECHNIQUE: Gray-scale sonography with graded compression, as well as color Doppler and duplex ultrasound were performed to evaluate the upper extremity deep venous system from the level of the subclavian vein and including the jugular, axillary, basilic, radial, ulnar and upper cephalic vein. Spectral Doppler was utilized to evaluate flow at rest and with distal augmentation maneuvers. COMPARISON:  None. FINDINGS: Contralateral Subclavian Vein: Respiratory phasicity is normal and symmetric with the symptomatic side. No evidence of thrombus. Normal compressibility. Internal Jugular Vein: No evidence of thrombus. Normal compressibility, respiratory phasicity and response to augmentation. Subclavian Vein: No evidence of thrombus. Normal compressibility, respiratory phasicity and response to augmentation. Axillary Vein: No evidence of thrombus. Normal compressibility, respiratory phasicity and response to augmentation. Cephalic Vein: No evidence of thrombus. Normal compressibility, respiratory phasicity and response to  augmentation. Basilic Vein: No evidence of thrombus. Normal compressibility, respiratory phasicity and response to augmentation. Brachial Veins: No evidence of thrombus. Normal compressibility, respiratory phasicity and response to augmentation. Radial Veins: No evidence of thrombus. Normal compressibility, respiratory phasicity and response to augmentation. Ulnar Veins: No evidence of thrombus. Normal compressibility, respiratory phasicity and response to augmentation. Venous Reflux:  None visualized. Other Findings:  None visualized. IMPRESSION: No evidence of DVT within the left upper extremity. Electronically Signed   By: Abelardo Diesel M.D.   On: 08/29/2016 18:23    Medications: I have reviewed the patient's current medications.  Assessment/Plan: 1. Acute on chronic respiratory failure. Patient was crying more than her usual oxygen at home. His was likely secondary to CHF exacerbation. We'll wean oxygen down to baseline as tolerated. 2. Elevated troponin. Likely demand ischemia. We'll trend these. Cardiology has been consulted. 3. CHF exacerbation. Patient currently being diuresed. 4. Hypertension. Patient's family says that her blood pressures have been low and that Dr. Rockey Situ has been ingesting these. She appears to need further adjustments and will coordinate this with his office so asked them to see her while she is here.  Total time spent 20 minutes  LOS: 1 day   Baxter Hire 08/30/2016, 8:37 AM

## 2016-08-30 NOTE — Evaluation (Signed)
Occupational Therapy Evaluation Patient Details Name: [name] MRN: [id number] DOB: 01-14-1925 Today's Date: 08/30/2016    History of Present Illness [name]  is a 83 y.o. female with a known history ofChronic respiratory failure on home oxygen 2 L, Chronic diastolic CHF, carotid arterial disease, cognitive impairment, cervical spine arthritis, anemia, hypertension, diabetes and hyperlipidemia. She is awake but not good historian. Per family, She has had worsening shortness breath and leg swelling for the past one week. Her Lasix was decreased one week ago. She also had some left arm swelling Chest x-ray show CHF. The patient's family also mentioned that patient has had little facial droop on the right side for 4 days. 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 [place] of LUE and LLE negative for DVT.    Clinical Impression   Patient seen for OT evaluation this date, daughter present during evaluation.  Patient presents with muscle weakness, left hand and wrist edema, pain, guarding, decreased transfers, functional mobility, and decreased ability to perform ADLs and IADL tasks.  She lived alone prior to January of this year but has had family to rotate to stay with her in the last few months due to medical issues.  She is now on oxygen at home within the last month.  She would benefit from skilled OT in the acute setting as well as short term rehab at discharge to maximize her safety and independence in daily tasks.  She has a supportive family and would like to eventually go home.     Follow Up Recommendations  SNF    Equipment Recommendations       Recommendations for Other Services       Precautions / Restrictions Precautions Precautions: Fall Restrictions Weight Bearing Restrictions: No      Mobility Bed Mobility Overal bed mobility: Needs Assistance Bed Mobility: Sit to Supine       Sit to supine: Mod assist;+2 for physical assistance   General bed mobility comments:  Increased Assistance of 2 with bed mobility to guide shoulders into bed, patient attempting twice to left feet up onto bed however required assistance on 3rd attempt.   Was able to roll with use of bed rails, assist to move up in bed and position with pillow between knees, PT present to assist with task.   Transfers Overall transfer level: Needs assistance Equipment used: Rolling walker (2 wheeled) Transfers: Sit to/from [facility] Sit to Stand: Mod assist;+2 physical assistance Stand pivot transfers: Mod assist            Balance                                           ADL either performed or assessed with clinical judgement   ADL Overall ADL's : Needs assistance/impaired Eating/Feeding: Modified independent;Set up Eating/Feeding Details (indicate cue type and reason): Patient now using her right hand for self feeding due to left hand pain. Grooming: Minimal assistance   Upper Body Bathing: Minimal assistance   Lower Body Bathing: Moderate assistance   Upper Body Dressing : Minimal assistance   Lower Body Dressing: Moderate assistance;+2 for physical assistance Lower Body Dressing Details (indicate cue type and reason): per clinical judgment, patient felt bed height was too high for task. Toilet Transfer: Moderate assistance Toilet Transfer Details (indicate cue type and reason): per clinical judgment Toileting- Clothing Manipulation and Hygiene:  Moderate assistance;+2 for physical assistance Toileting - Clothing Manipulation Details (indicate cue type and reason): per clinical judgment     Functional mobility during ADLs: Moderate assistance       Vision Patient Visual Report: No change from baseline       Perception     Praxis      Pertinent Vitals/Pain Pain Assessment: 0-10 Pain Score: 8  Pain Location: patient reports left hand is sor,, had x rays in the past on 06-20-16 which revealed "Cortical irregularity along the  dorsal aspect of the third metacarpal neck with overlying soft tissue swelling which may reflect a nondisplaced fracture.  Pain Descriptors / Indicators: Tender;Sore Pain Intervention(s): Limited activity within patient's tolerance;Repositioned;Monitored during session     Hand Dominance Left   Extremity/Trunk Assessment Upper Extremity Assessment Upper Extremity Assessment: Generalized weakness;LUE deficits/detail LUE Deficits / Details: Patient left hand dominant, mild edema noted in left hand and wrist, limited movement noted and patient tends to guard hand.  Some MP flexion noted only, patient hesitant to move hand.  LUE Coordination: decreased gross motor   Lower Extremity Assessment Lower Extremity Assessment: Defer to PT evaluation       Communication Communication Communication: No difficulties   Cognition Arousal/Alertness: Awake/alert Behavior During Therapy: WFL for tasks assessed/performed Overall Cognitive Status: Within Functional Limits for tasks assessed                                 General Comments: Occasionally slow to respond at times and reports fatigue this pm.   General Comments       Exercises     Shoulder Instructions      Home Living Family/patient expects to be discharged to:: Private residence Living Arrangements: Alone Available Help at Discharge: Family;Available PRN/intermittently Type of Home: House Home Access: Ramped entrance;Stairs to enter Entrance Stairs-Number of Steps: 3 steps with L rail into home and 2 steps down (no rail) to pt's closet   Home Layout: One level     Bathroom Shower/Tub: Tub/shower unit;Walk-in shower;Curtain   Bathroom Toilet: Handicapped height Bathroom Accessibility: Yes   Home Equipment: Shower seat;Cane - single point;Walker - 4 wheels          Prior Functioning/Environment Level of Independence: Independent        Comments: Patient has a supportive family, 11 children and  several who have taken turns to stay with her as needed since January.  Prior to January she was living alone, independent with basic self care tasks, light homemaking, cooking, gardening and attending church.  She has not driven in the last 2 years.  She is now on oxygen and has had it at home for the last month only.  She would like to return home but is willing to go for short term rehab to get stronger.          OT Problem List: Decreased strength;Impaired balance (sitting and/or standing);Pain;Decreased range of motion;Increased edema;Decreased activity tolerance;Decreased knowledge of use of DME or AE;Impaired UE functional use;Decreased coordination      OT Treatment/Interventions: Self-care/ADL training;DME and/or AE instruction;Therapeutic activities;Balance training;Therapeutic exercise;Manual therapy;Neuromuscular education;Patient/family education    OT Goals(Current goals can be found in the care plan section) Acute Rehab OT Goals Patient Stated Goal: to be able to go back home, do everything for myself, work in the yard, my garden and go to church. OT Goal Formulation: With patient/family Time For Goal Achievement: 09/09/16 Potential  to Achieve Goals: Good  OT Frequency: Min 1X/week   Barriers to D/C:            Co-evaluation              AM-PAC PT "6 Clicks" Daily Activity     Outcome Measure Help from another person eating meals?: None Help from another person taking care of personal grooming?: None Help from another person toileting, which includes using toliet, bedpan, or urinal?: A Lot Help from another person bathing (including washing, rinsing, drying)?: A Lot Help from another person to put on and taking off regular upper body clothing?: A Little Help from another person to put on and taking off regular lower body clothing?: A Lot 6 Click Score: 17   End of Session Equipment Utilized During Treatment: Gait belt  Activity Tolerance: Patient tolerated  treatment well;Patient limited by fatigue Patient left: in bed;with call bell/phone within reach;with family/visitor present;with bed alarm set  OT Visit Diagnosis: Muscle weakness (generalized) (M62.81);Pain;Unsteadiness on feet (R26.81) Pain - Right/Left: Left Pain - part of body: Hand                Time: 1516-1550 OT Time Calculation (min): 34 min Charges:  OT General Charges $OT Visit: 1 Procedure OT Evaluation $OT Eval Low Complexity: 1 Procedure OT Treatments $Self Care/Home Management : 8-22 mins G-Codes:     Amy T Lovett, OTR/L, CLT  Lovett,Amy 08/30/2016, 4:31 PM

## 2016-08-30 NOTE — Progress Notes (Signed)
Visit made. Patient is currently followed by [facility]ospice and [place] at home with a hospice diagnosis of Aortic stenosis. She is a full code. Patient was seen by her hospice nurse for symptoms of lower edema and increased oxygen need. Patient and family were offered in home symptom management and  declined this option. They chose to transport to the [facility] for evaluation and treatment. Per chart note review patient is being treated for acute on chronic diastolic heart failure. Patient seen sitting up in the recliner, alert and quietly interactive with Probation officer. Daughter [name] present as well as Astronomer Curt Bears. Patient did well, able to feed her self ice cream and drink Ensure. [name] expressed her wishes to continue to pursue "anything that will make her better". She stated " she is not ready to give up and neither am I". Writer provided emotional support and reassurance. PT and OT consults are pending. [name] expressed interest in pursuing short term rehab if this is recommended. Writer briefly discussed the process for revocation of hospice benefit if patient does discharge to Rehab. [name] voiced her understanding. Will continue to follow and update hospice team. Thank you. Flo Shanks RN, BSN, [facility] [place], hospital Liaison [phone] c

## 2016-08-30 NOTE — Consult Note (Signed)
Cardiology Consultation Note  Patient ID: [name], MRN: [id number], DOB/AGE: [birth year] 83 y.o. Admit date: 08/29/2016   Date of Consult: 08/30/2016 Primary Physician: Tegeler, Loma Sousa, MD Primary Cardiologist: Dr. Rockey Situ, MD Requesting Physician: Dr. Edwina Barth, MD  Chief Complaint: SOB, LE swelling, generalized weakness, difficulty swallowing, malaise, and fatigue Reason for Consult: Acute on chronic diastolic CHF, pulmonary HTN, hypertension, and failure to thrive  HPI: [name] is a 83 y.o. female who is being seen today for the evaluation of acute on chronic diastolic CHF, pulmonary labile HTN, hypertension, and failure to thrive at the request of Dr. Edwina Barth, MD. Patient has a h/o chronic diastolic CHF/pulmonary HTN, pSVT, moderate aortic stenosis, prior stroke, DM2, HTN, HLD, goiter, obesity, and malnutrition who has been under Hospice care. She was admitted on 7/3 with a 1 week history of SOB, LW swelling, orthopnea, generalized weakness, malaise, and difficulty swallowing/belching. Cardiology is asked to evaluate her medication regimen.   Previously admitted in 03/9560 for UTI complicated by dehydration and difficulty swallowing. Developed pSVT. Echo at that time showed EF 55-60%, DD, mild LVH, moderate AS, normal RV size and systolic function. CT neck showed multinodular goiter and carotid artery disease. Admitted again in 08/2014 for bug bites complicated by pSVT. Converted to NSR while admitted. Has had a history of labile HTN with frequent BP medication changes over the years, somewhat felt to be 2/2 dehydration and poor PO intake. Admitted in 03/2016 for enterococcus bacteremia with sepsis. TEE at that time was negative for SBE. EF 55-60% with moderate AS. PASP 60 mmHg. She has been managed on prn Lasix as an outpatient, though over the past 1 week she has had increased LE swelling, SOB, worsening orthopnea and decreased PO intake. She frequently tells her family she does not  feel well but cannot elaborate any further as to why she does not feel well. She has been under to the care of Hospice.   Upon the patient's arrival to [facility] they were found to have BP 137/32, HR 70 bpm, temp 98.6, oxygen saturation 96% on nasal cannula, weight 149 pounds. EKG not acute as below, CXR showed small bilateral pleural effusions, CT head without acute process. Prior remote right thalamic and cerebellar infarct noted. [place] UE/LE negative for DVT. Labs showed [id number], troponin 0.07, not cycled, Mg++ 1.3, K+ 3.1-->3.5, SCr 0.62, HGB 10.3, WBC 7.6, PLT 193. She was started on IV Lasix 40 mg bid with a documented UOP of 2 L. Remains somewhat lethargic. Family at bedside with multiple concerns including difficulty swallowing and belching, left UE swelling with associated heaviness, SOB, and decreased appetite. No chest pain.   Past Medical History:  Diagnosis Date  . Anemia   . Bone disease   . Carotid arterial disease ([place])    a. 08/2014 U/S: Bilateral < 50% stneosis. Patent vertebrals w/ antegrade flow.   . Cervical spine arthritis ([place])   . Chronic diastolic CHF (congestive heart failure) ([place])    a. echo 2014: EF 55-60%, DD;  b. 08/2014 Echo: EF 55-60%, no RWMA, GR1DD; c. 02/2016 Echo: EF 65-70%, Gr1 DD.  Marland Kitchen Cognitive impairment   . Depression   . Diabetes mellitus without complication ([place])   . Essential hypertension   . GERD (gastroesophageal reflux disease)   . Glaucoma   . Gout   . Hiatal hernia   . History of renal impairment   . Hyperlipidemia   . Hypotension    a. Related to Norvasc - 11/2014 ED visit.  Marland Kitchen  Multinodular goiter    a. Noted incidentally on CT 07/2012 and carotid U/S 08/2014;  b. Nl TSH 11/2014.  Marland Kitchen Paroxysmal SVT (supraventricular tachycardia) (HCC)    a. on Toprol   . Prolapsed uterus   . Severe aortic stenosis    a. echo 08/2014: EF 55-60%, no RWMA, GR1DD, severe AS, mild AI, Mean gradient (S): 38 mm Hg. Valve area (VTI): 0.78 cm^2., mild-mod MR, LA mildly dilated,  atrial septal aneurysm, mild TR;  b. 02/2016 Echo: EF 65-70%, Gr1 DD, sev AS, mild to mod AI, Valve area (VTI): 0.94 cm^2, (Vmax) 0.96 cm^2, (Vmean) 0.93cm^2, mild MR, mildly dil LA, nl RV fxn, PASP 61mmHg.  . Stroke [facility])    a. CT head 07/2014 showed small thalamic infarct  . Vitamin deficiency       Most Recent Cardiac Studies: TTE 03/31/2016: Study Conclusions  - Left ventricle: Systolic function was normal. The estimated   ejection fraction was in the range of 55% to 60%. Wall motion was   normal; there were no regional wall motion abnormalities. - Aortic valve: Trileaflet; severely calcified leaflets. Estimated   AVA of 1.34 cm sq There was moderate stenosis. There was mild to   moderate regurgitation directed eccentrically in the LVOT. - Aorta: There was mild to moderate diffuse atheroma. - Left atrium: No evidence of thrombus in the atrial cavity or   appendage. - Right atrium: No evidence of thrombus in the atrial cavity or   appendage. - Atrial septum: There was a patent foramen ovale. Doppler shunt   from left to right noted also positive saline contrst bubble   study at rest (valsalva not performed). - Tricuspid valve: There was moderate regurgitation. - Pulmonary arteries: PA peak pressure: 60 mm Hg (S).  Impressions:  - No evidence of endocarditis. The right ventricular systolic   pressure was increased consistent with moderate pulmonary   hypertension.  TTE 03/23/2016: Study Conclusions  - Left ventricle: The cavity size was at the upper limits of   normal. Wall thickness was increased in a pattern of moderate   LVH. Systolic function was vigorous. The estimated ejection   fraction was in the range of 65% to 70%. Wall motion was normal;   there were no regional wall motion abnormalities. Doppler   parameters are consistent with abnormal left ventricular   relaxation (grade 1 diastolic dysfunction). Doppler parameters   are consistent with high ventricular  filling pressure. - Aortic valve: Trileaflet; severely thickened, moderately   calcified leaflets. Transvalvular velocity was increased. There   was severe stenosis. There was mild to moderate regurgitation.   Valve area (VTI): 0.94 cm^2. Valve area (Vmax): 0.96 cm^2. Valve   area (Vmean): 0.93 cm^2. - Mitral valve: Calcified annulus. Mildly thickened leaflets .   There was mild regurgitation. - Left atrium: The atrium was mildly dilated. - Right ventricle: The cavity size was normal. Systolic function   was normal. - Atrial septum: There was an atrial septal aneurysm. - Pulmonary arteries: Systolic pressure was severely increased. PA   peak pressure: 75 mm Hg (S). - Pericardium, extracardiac: A trivial pericardial effusion was   identified.   Surgical History:  Past Surgical History:  Procedure Laterality Date  . TEE WITHOUT CARDIOVERSION N/A 03/31/2016   Procedure: TRANSESOPHAGEAL ECHOCARDIOGRAM (TEE);  Surgeon: Minna Merritts, MD;  Location: [facility] ORS;  Service: Cardiovascular;  Laterality: N/A;  . TUBAL LIGATION       Home Meds: Prior to Admission medications   Medication Sig  Start Date End Date Taking? Authorizing Provider  allopurinol (ZYLOPRIM) 300 MG tablet Take 300 mg by mouth daily.   Yes [provider]  brimonidine (ALPHAGAN P) 0.1 % SOLN Place 1 drop into the right eye 2 (two) times daily.   Yes [provider]  Cholecalciferol (VITAMIN D) 2000 units CAPS Take 2,000 Units by mouth daily.    Yes [provider]  Dorzolamide HCl-Timolol Mal (COSOPT OP) Place 1 drop into the right eye 2 (two) times daily.    Yes [provider]  gabapentin (NEURONTIN) 300 MG capsule Take 300 mg by mouth 2 (two) times daily.   Yes [provider]  glimepiride (AMARYL) 1 MG tablet Take 1 mg by mouth daily with breakfast.   Yes [provider]  iron polysaccharides (NIFEREX) 150 MG capsule Take 150 mg by mouth 2 (two) times daily.   Yes  [provider]  isosorbide mononitrate (IMDUR) 60 MG 24 hr tablet Take 1 tablet (60 mg total) by mouth 2 (two) times daily. Patient taking differently: Take 30 mg by mouth 2 (two) times daily.  12/31/15 12/30/16 Yes Gollan, Kathlene November, MD  latanoprost (XALATAN) 0.005 % ophthalmic solution Place 1 drop into the right eye at bedtime. 07/03/14  Yes [provider]  lisinopril (PRINIVIL,ZESTRIL) 40 MG tablet Take 40 mg by mouth at bedtime.   Yes [provider]  lovastatin (MEVACOR) 40 MG tablet Take 40 mg by mouth at bedtime.   Yes [provider]  metoprolol succinate (TOPROL-XL) 25 MG 24 hr tablet Take 3 tablets (75 mg total) by mouth daily. Take with or immediately following a meal. 09/12/14  Yes Vaughan Basta, MD  pantoprazole (PROTONIX) 40 MG tablet Take 40 mg by mouth 2 (two) times daily.   Yes [provider]  pilocarpine (PILOCAR) 4 % ophthalmic solution Place 1 drop into the right eye 4 (four) times daily.    Yes [provider]  potassium chloride (K-DUR) 10 MEQ tablet Take 1 tablet (10 mEq total) by mouth daily. Take with lasix 03/23/16 08/29/16 Yes Gollan, Kathlene November, MD  traZODone (DESYREL) 100 MG tablet Take 100 mg by mouth at bedtime.   Yes [provider]  acetaminophen (TYLENOL) 500 MG tablet Take 500 mg by mouth every 6 (six) hours as needed.    [provider]  aspirin 81 MG tablet Take 81 mg by mouth daily.    [provider]  doxazosin (CARDURA) 4 MG tablet Take 1 tablet (4 mg total) by mouth daily. 04/19/16   Minna Merritts, MD  feeding supplement, ENSURE ENLIVE, (ENSURE ENLIVE) LIQD Take 237 mLs by mouth 2 (two) times daily between meals. 04/01/16   Fritzi Mandes, MD  furosemide (LASIX) 20 MG tablet Take 1 tablet (20 mg total) by mouth as needed for edema. Patient taking differently: Take 40 mg by mouth daily.  04/19/16 05/19/16  Minna Merritts, MD  hydrALAZINE (APRESOLINE) 25 MG tablet Take 1 tablet  (25 mg total) by mouth 3 (three) times daily as needed. 04/19/16   Minna Merritts, MD  lidocaine (LIDODERM) 5 % Place 1 patch onto the skin daily. Remove & Discard patch within 12 hours or as directed by MD 04/01/16   Fritzi Mandes, MD  LORazepam (ATIVAN) 1 MG tablet Take 1 tablet (1 mg total) by mouth 2 (two) times daily. 04/25/16 04/25/17  Earleen Newport, MD  tiZANidine (ZANAFLEX) 4 MG tablet Take 1 tablet by mouth 3 (three) times daily  as needed. 08/08/14   [provider]  traMADol (ULTRAM) 50 MG tablet Take 1 tablet (50 mg total) by mouth every 6 (six) hours as needed for moderate pain. Patient not taking: Reported on 07/26/2016 04/01/16   Fritzi Mandes, MD    Inpatient Medications:  . allopurinol  300 mg Oral Daily  . aspirin EC  81 mg Oral Daily  . brimonidine  1 drop Right Eye BID  . dorzolamide-timolol  1 drop Right Eye BID  . enoxaparin (LOVENOX) injection  40 mg Subcutaneous Q24H  . feeding supplement (ENSURE ENLIVE)  237 mL Oral BID BM  . furosemide  40 mg Intravenous Q12H  . gabapentin  300 mg Oral BID  . insulin aspart  0-5 Units Subcutaneous QHS  . insulin aspart  0-9 Units Subcutaneous TID WC  . iron polysaccharides  150 mg Oral BID  . isosorbide mononitrate  30 mg Oral Daily  . latanoprost  1 drop Right Eye QHS  . lisinopril  40 mg Oral QHS  . LORazepam  1 mg Oral BID  . metoprolol succinate  25 mg Oral Daily  . pantoprazole  40 mg Oral BID  . pilocarpine  1 drop Right Eye QID  . potassium chloride  10 mEq Oral Daily  . potassium chloride  40 mEq Oral BID  . pravastatin  40 mg Oral q1800  . sodium chloride flush  3 mL Intravenous Q12H  . traZODone  100 mg Oral QHS   . sodium chloride      Allergies:  Allergies  Allergen Reactions  . Metformin And Related     Social History   Social History  . Marital status: Widowed    Spouse name: N/A  . Number of children: N/A  . Years of education: N/A   Occupational History  . Not on file.   Social  History Main Topics  . Smoking status: Never Smoker  . Smokeless tobacco: Never Used  . Alcohol use No  . Drug use: No  . Sexual activity: Not on file   Other Topics Concern  . Not on file   Social History Narrative  . No narrative on file     Family History  Problem Relation Age of Onset  . Glaucoma Mother   . Diabetes Mother   . Peptic Ulcer Father   . Colitis Father      Review of Systems: Review of Systems  Unable to perform ROS: Medical condition    Labs:  Recent Labs  08/29/16 1556  TROPONINI 0.07*   Lab Results  Component Value Date   WBC 7.6 08/29/2016   HGB 10.3 (L) 08/29/2016   HCT 31.3 (L) 08/29/2016   MCV 90.3 08/29/2016   PLT 193 08/29/2016     Recent Labs Lab 08/30/16 0512  NA 143  K 3.5  CL 103  CO2 36*  BUN 17  CREATININE 0.61  CALCIUM 9.0  PROT 5.4*  BILITOT 0.9  ALKPHOS 30*  ALT 20  AST 25  GLUCOSE 67   Lab Results  Component Value Date   CHOL 114 09/11/2014   HDL 40 (L) 09/11/2014   LDLCALC 55 09/11/2014   TRIG 97 09/11/2014   No results found for: DDIMER  Radiology/Studies:  Dg Chest 2 View  Result Date: 08/29/2016 IMPRESSION: Mild congestive heart failure.  Small bilateral pleural effusions. Electronically Signed   By: Abelardo Diesel M.D.   On: 08/29/2016 16:34   Ct Head Wo Contrast  Result Date: 08/29/2016  IMPRESSION: No intracranial hemorrhage or CT evidence of large acute infarct. 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 Chronic microvascular changes. Global atrophy. Electronically Signed   By: Genia Del M.D.   On: 08/29/2016 17:54   US Venous Img Lower Unilateral Left  Result Date: 08/29/2016  IMPRESSION: No evidence of DVT within the left lower extremity. Electronically Signed   By: Genia Del M.D.   On: 08/29/2016 18:49   US Venous Img Upper Uni Left  Result Date: 08/29/2016 IMPRESSION: No evidence of DVT within the left upper extremity. Electronically Signed   By: Abelardo Diesel M.D.    On: 08/29/2016 18:23    EKG: Interpreted by me showed: NSR, 66 bpm, LVH, poor R wave progression, TWI I and aVL Telemetry: Interpreted by me showed: NSR  Weights: Filed Weights   08/29/16 1545 08/29/16 2048 08/30/16 0602  Weight: 140 lb (63.5 kg) 149 lb 9.6 oz (67.9 kg) 149 lb 10.3 oz (67.9 kg)     Physical Exam: Blood pressure (!) 138/35, pulse 71, temperature 97.8 F (36.6 C), temperature source Oral, resp. rate 18, height 5' (1.524 m), weight 149 lb 10.3 oz (67.9 kg), SpO2 100 %. Body mass index is 29.23 kg/m. General: Frail appearing, in no acute distress. Head: Normocephalic, atraumatic, sclera non-icteric, no xanthomas, nares are without discharge.  Neck: Negative for carotid bruits. JVD elevated to the angle of the mandible. Lungs: Diminished breath sounds bilaterally with bibasilar crackles. Breathing is unlabored. Heart: RRR with S1 S2. II/VI systolic murmur RUSB radiating to the carotid, no rubs, or gallops appreciated. Abdomen: Soft, non-tender, non-distended with normoactive bowel sounds. No hepatomegaly. No rebound/guarding. No obvious abdominal masses. Msk:  Strength and tone appear normal for age. Extremities: No clubbing or cyanosis. 1+ pitting edema to the bilateral thighs. Distal pedal pulses are 2+ and equal bilaterally. Mild STS of the left upper extremity.  Neuro: Alert. No facial asymmetry. No focal deficit. Moves all extremities spontaneously. Psych:  Minimally conversive with a normal affect.    Assessment and Plan:  Principal Problem:   Acute on chronic diastolic CHF (congestive heart failure) (HCC) Active Problems:   Elevated troponin   Aortic valve stenosis, moderate   Malnutrition (HCC)   Anemia   Acute respiratory distress   Diabetes mellitus, type 2 (HCC)   Essential hypertension    1. Acute on chronic diastolic CHF: -She does appear grossly volume overloaded on exam today with bibasilar crackles, elevated JVD and LE swelling -Agree with IV  Lasix 40 mg bid with KCl repletion -Check TTE to evaluate for reduction in EF, WMA, aortic valve, RV function, and PASP - Wean O2 as able per IM  2. Elevated troponin: -Initial troponin mildly elevated at 0.07 in the ED, not trended -Cycle troponi x 3 to rule out -Likely supply demand ischemia in the setting of anemia, volume overload, and AS -Check TTE as above -Unlikely to be a good candidate for invasive evaluation given her advanced age and comorbidities  3. Moderate aortic stenosis: -By TEE in 03/2016 -Check TTE as above -Unlikely she would be a candidate for invasive valve repair/replacement as above -She is very volume dependent   4. Malnutrition/generalized malaise/fatgiue: -Likely playing a role in her health -Has been under Hospice care -Generalized malaise likely multifactorial including malnutrition, anemia, pulmonary HTN with possible worsening of RV systolic function, and deconditioning  -Possibly exacerbated by some of her medications -TSH pending  5. Labile HTN: -Stop Cardura with titration of her hydralazine to  50 mg tid -Consider placing patient on her home dose of Toprol which is 75 mg daily -She is on Imdur 60 mg at home and 30 mg here, monitor BP and titrate as needed  6. Anemia: -On iron supplements -Per IM  7. Hypokalemia: -Repletion ordered by IM  8. Hypomagnesemia: -Replete to goal of 2.0  9. Difficulty swallowing/belching: -Per IM   Signed, Christell Faith, PA-C [facility] Pager: [phone] 08/30/2016, 9:24 AM

## 2016-08-30 NOTE — Evaluation (Signed)
Physical Therapy Evaluation Patient Details Name: [name] MRN: [id number] DOB: [birth year] Today's Date: 08/30/2016   History of Present Illness  [name]  is a 83 y.o. female with a known history ofChronic respiratory failure on home oxygen 2 L, Chronic diastolic CHF, carotid arterial disease, cognitive impairment, cervical spine arthritis, anemia, hypertension, diabetes and hyperlipidemia. She is awake but not good historian. Per family, She has had worsening shortness breath and leg swelling for the past one week. Her Lasix was decreased one week ago. She also had some left arm swelling Chest x-ray show CHF. The patient's family also mentioned that patient has had little facial droop on the right side for 4 days. 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 [place] of LUE and LLE negative for DVT.   Clinical Impression  Patient globally weak and deconditioned, requiring mod assist +1-2 for all basic transfers and gait attempts.  Consistent posterior lean/weight shift with absent spontaneous righting or correction noted.  Very high fall risk.  Fatigues quickly, but demonstrates fair/good effort with all evaluation components despite. Would benefit from skilled PT to address above deficits and promote optimal return to PLOF; recommend transition to STR upon discharge from acute hospitalization.  Will monitor ability to fully tolerate and participate with structured, intensive rehab program.     Follow Up Recommendations SNF    Equipment Recommendations       Recommendations for Other Services       Precautions / Restrictions Precautions Precautions: Fall Restrictions Weight Bearing Restrictions: No      Mobility  Bed Mobility Overal bed mobility: Needs Assistance Bed Mobility: Sit to Supine       Sit to supine: Mod assist;+2 for physical assistance   General bed mobility comments: assist for trunk control and LE elevation over edge of bed.  Attemtping heavy use of momentum to  elevate LEs, ultimately requiring therapist assist  Transfers Overall transfer level: Needs assistance Equipment used: Rolling walker (2 wheeled) Transfers: Sit to/from [facility] Sit to Stand: Mod assist;+2 physical assistance Stand pivot transfers: Mod assist;+2 safety/equipment       General transfer comment: heavy posterior lean without spontaneous righting; assist for forward trunk lean/weight shift, lift off and standing balance  Ambulation/Gait Ambulation/Gait assistance: Mod assist;+2 safety/equipment Ambulation Distance (Feet): 5 Feet Assistive device: Rolling walker (2 wheeled)       General Gait Details: very short, shuffling steps with poor weight shift, poor standing balance; consistent posteiror trunk lean  Stairs            Wheelchair Mobility    Modified Rankin (Stroke Patients Only)       Balance Overall balance assessment: Needs assistance Sitting-balance support: No upper extremity supported;Feet supported Sitting balance-Leahy Scale: Fair     Standing balance support: Bilateral upper extremity supported Standing balance-Leahy Scale: Zero                               Pertinent Vitals/Pain Pain Assessment: Faces Faces Pain Scale: Hurts even more Pain Location: patient reports left hand is sor,, had x rays in the past on 06-20-16 which revealed "Cortical irregularity along the dorsal aspect of the third metacarpal neck with overlying soft tissue swelling which may reflect a nondisplaced fracture.  Pain Descriptors / Indicators: Sore Pain Intervention(s): Limited activity within patient's tolerance;Monitored during session;Repositioned    Home Living Family/patient expects to be discharged to:: Private residence Living Arrangements: Alone  Available Help at Discharge: Family;Available PRN/intermittently Type of Home: House Home Access: Ramped entrance;Stairs to enter     Home Layout: One level Home Equipment:  Shower seat;Cane - single point;Walker - 4 wheels      Prior Function Level of Independence: Independent         Comments: Per OT, Patient has a supportive family, 11 children and several who have taken turns to stay with her as needed since January.  Prior to January she was living alone, independent with basic self care tasks, light homemaking, cooking, gardening and attending church.  She has not driven in the last 2 years.  She is now on oxygen and has had it at home for the last month only.  She would like to return home but is willing to go for short term rehab to get stronger.       Hand Dominance   Dominant Hand: Left    Extremity/Trunk Assessment   Upper Extremity Assessment Upper Extremity Assessment: Defer to OT evaluation LUE Deficits / Details: R UE grossly WFL; L UE generally guarded throughout L elbow and wrist, reduced ROM in elbow, wrist and hand due to pain. Mild edema noted L UE    Lower Extremity Assessment Lower Extremity Assessment: Generalized weakness (generally 3+ to 4-/5 throughout bilat LEs)       Communication   Communication: No difficulties  Cognition Arousal/Alertness: Awake/alert Behavior During Therapy: WFL for tasks assessed/performed Overall Cognitive Status: Within Functional Limits for tasks assessed                                        General Comments      Exercises     Assessment/Plan    PT Assessment Patient needs continued PT services  PT Problem List Decreased strength;Decreased range of motion;Decreased activity tolerance;Decreased balance;Decreased mobility;Decreased knowledge of use of DME;Decreased safety awareness;Decreased knowledge of precautions;Pain       PT Treatment Interventions DME instruction;Gait training;Functional mobility training;Therapeutic activities;Therapeutic exercise;Balance training;Patient/family education    PT Goals (Current goals can be found in the Care Plan section)  Acute  Rehab PT Goals Patient Stated Goal: to do everything she can to get better PT Goal Formulation: With family Time For Goal Achievement: 09/13/16 Potential to Achieve Goals: Fair    Frequency Min 2X/week   Barriers to discharge        Co-evaluation PT/OT/SLP Co-Evaluation/Treatment: Yes Reason for Co-Treatment: To address functional/ADL transfers;For patient/therapist safety PT goals addressed during session: Mobility/safety with mobility OT goals addressed during session: ADL's and self-care       AM-PAC PT "6 Clicks" Daily Activity  Outcome Measure Difficulty turning over in bed (including adjusting bedclothes, sheets and blankets)?: Total Difficulty moving from lying on back to sitting on the side of the bed? : Total Difficulty sitting down on and standing up from a chair with arms (e.g., wheelchair, bedside commode, etc,.)?: Total Help needed moving to and from a bed to chair (including a wheelchair)?: Total Help needed walking in hospital room?: Total Help needed climbing 3-5 steps with a railing? : Total 6 Click Score: 6    End of Session Equipment Utilized During Treatment: Gait belt Activity Tolerance: Patient limited by fatigue Patient left: in bed;with bed alarm set;with family/visitor present;with call bell/phone within reach Nurse Communication: Mobility status PT Visit Diagnosis: Muscle weakness (generalized) (M62.81);Difficulty in walking, not elsewhere classified (R26.2)  Time: 8984-2103 PT Time Calculation (min) (ACUTE ONLY): 21 min   Charges:   PT Evaluation $PT Eval Low Complexity: 1 Procedure     PT G Codes:         Srija Southard H. Owens Shark, PT, DPT, NCS 08/30/16, 11:43 PM [phone]

## 2016-08-31 LAB — BASIC METABOLIC PANEL
ANION GAP: 8 (ref 5–15)
BUN: 17 mg/dL (ref 6–20)
CALCIUM: 9.3 mg/dL (ref 8.9–10.3)
CO2: 36 mmol/L — ABNORMAL HIGH (ref 22–32)
Chloride: 101 mmol/L (ref 101–111)
Creatinine, Ser: 0.76 mg/dL (ref 0.44–1.00)
Glucose, Bld: 80 mg/dL (ref 65–99)
POTASSIUM: 3.7 mmol/L (ref 3.5–5.1)
Sodium: 145 mmol/L (ref 135–145)

## 2016-08-31 LAB — GLUCOSE, CAPILLARY
GLUCOSE-CAPILLARY: 58 mg/dL — AB (ref 65–99)
GLUCOSE-CAPILLARY: 177 mg/dL — AB (ref 65–99)
GLUCOSE-CAPILLARY: 125 mg/dL — AB (ref 65–99)
Glucose-Capillary: 102 mg/dL — ABNORMAL HIGH (ref 65–99)
Glucose-Capillary: 96 mg/dL (ref 65–99)

## 2016-08-31 MED ORDER — LORAZEPAM 0.5 MG PO TABS
0.5000 mg | ORAL_TABLET | Freq: Two times a day (BID) | ORAL | Status: DC
  Administered 2016-08-31 – 2016-09-02 (×4): 0.5 mg via ORAL
  Filled 2016-08-31 (×4): qty 1

## 2016-08-31 MED ORDER — HYDROCODONE-ACETAMINOPHEN 5-325 MG PO TABS: 1.0000 | ORAL_TABLET | Freq: Four times a day (QID) | ORAL | Status: DC | PRN

## 2016-08-31 NOTE — Clinical Social Work Note (Signed)
Patient has [employer], CSW submitted clinicals awaiting insurance authorization.  Patient's [facility] number is also pending due to [facility] being closed today due to holiday CSW still waiting on [facility] number.  Patient can not discharge to SNF until [facility] number has been received.  CSW continuing to follow patient's progress throughout discharge planning.  Jones Broom. [place], MSW, [place]  08/31/2016 1:20 PM

## 2016-08-31 NOTE — Clinical Social Work Note (Signed)
Clinical Social Work Assessment  Patient Details  Name: [name] MRN: [id number] Date of Birth: 06-18-1924  Date of referral:  08/31/16               Reason for consult:  Facility Placement                Permission sought to share information with:  Family Supports, Customer service manager Permission granted to share information::  Yes, Verbal Permission Granted  Name::     Harris,[name] Daughter [phone]   Agency::  SNF admissions  Relationship::     Contact Information:     Housing/Transportation Living arrangements for the past 2 months:  Single Family Home Source of Information:  Patient, Adult Children Patient Interpreter Needed:  None Criminal Activity/Legal Involvement Pertinent to Current Situation/Hospitalization:  No - Comment as needed Significant Relationships:  Adult Children Lives with:  Self Do you feel safe going back to the place where you live?  No Need for family participation in patient care:  Yes (Comment)  Care giving concerns:  Patient and her family feels that she needs some short term rehab before she is able to return back home.   Social Worker assessment / plan:  Patient is an 83 year old female who lives alone, but family visits and checks on her daily.  Patient is alert and oriented x3, however patient was sleepy, CSW mainly spoke to patient's daughter [name] who was at bedside.  Patient's family states that she has not been to rehab before, CSW spoke to patient and family about what to expect at [facility] and how insurance will pay for stay.  CSW explained role of social worker in trying to find placement for patient to go to SNF.  CSW was informed that patient's was receiving hosp[facility] at [facility] [place], however the patient and family have decided to revoke their hospice rights and pursue SNF placement for rehab.  Patient and family feel that patient needs some therapy and she is not ready to give up yet.   Patient's family would like SNF placement in [place], [place], or [place], [place] was given permission to begin bed search.  Patient and family did not express any other questions or concerns.  Employment status:  Retired Nurse, adult PT Recommendations:  [place] / Referral to community resources:  [place]  Patient/Family's Response to care: Patient and family are in agreement to going to SNF for short term rehab.  Patient/Family's Understanding of and Emotional Response to Diagnosis, Current Treatment, and Prognosis:  Patient's family are aware of patient's current prognosis and diagnosis.  Patient was sleepy and did not show any response to treatment plan.  Emotional Assessment Appearance:  Appears stated age Attitude/Demeanor/Rapport:    Affect (typically observed):  Quiet, Calm, Blunt, Appropriate, Stable Orientation:  Oriented to Self, Oriented to Place, Oriented to  Time, Oriented to Situation Alcohol / Substance use:  Not Applicable Psych involvement (Current and /or in the community):  No (Comment)  Discharge Needs  Concerns to be addressed:   Patient's family feels she needs short term rehab before she can go home. Readmission within the last 30 days:    Current discharge risk:  Lack of support system Barriers to Discharge:  Continued Medical Work up, [employer]   Ross Ludwig, [place] 08/31/2016, 1:07 PM

## 2016-08-31 NOTE — Progress Notes (Signed)
Subjective: Patient somewhat somnolent and she had some pain medication and some Ativan.  Objective: Vital signs in last 24 hours: Temp:  [97.8 F (36.6 C)-98.4 F (36.9 C)] 98.4 F (36.9 C) (07/04 0555) Pulse Rate:  [70-81] 70 (07/04 0555) Resp:  [16-20] 16 (07/04 0555) BP: (132-141)/(34-39) 135/39 (07/04 0555) SpO2:  [100 %] 100 % (07/04 0555) Weight:  [67.7 kg (149 lb 4 oz)] 67.7 kg (149 lb 4 oz) (07/04 0555) Weight change: 4.196 kg (9 lb 4 oz) Last BM Date: 08/29/16  Intake/Output from previous day: 07/03 0701 - 07/04 0700 In: 240 [P.O.:240] Out: 2150 [Urine:2150] Intake/Output this shift: No intake/output data recorded.  General appearance: no distress Resp: clear to auscultation bilaterally and normal percussion bilaterally Cardio: Regular rate and rhythm, 2/6 systolic murmur GI: soft, non-tender; bowel sounds normal; no masses,  no organomegaly  Lab Results:  Recent Labs  08/29/16 1556  WBC 7.6  HGB 10.3*  HCT 31.3*  PLT 193   BMET  Recent Labs  08/30/16 0512 08/31/16 0446  NA 143 145  K 3.5 3.7  CL 103 101  CO2 36* 36*  GLUCOSE 67 80  BUN 17 17  CREATININE 0.61 0.76  CALCIUM 9.0 9.3    Studies/Results: Dg Chest 2 View  Result Date: 08/29/2016 CLINICAL DATA:  Left chest pain EXAM: CHEST  2 VIEW COMPARISON:  May 02, 2016 FINDINGS: The heart size is enlarged. The mediastinal contour is stable. There is interstitial pulmonary edema. Small bilateral pleural effusions are identified. The visualized skeletal structures are stable. IMPRESSION: Mild congestive heart failure.  Small bilateral pleural effusions. Electronically Signed   By: Abelardo Diesel M.D.   On: 08/29/2016 16:34   Ct Head Wo Contrast  Result Date: 08/29/2016 CLINICAL DATA:  83 year old hypertensive female with left-sided pain and weakness for the past 2 days. Initial encounter. EXAM: CT HEAD WITHOUT CONTRAST TECHNIQUE: Contiguous axial images were obtained from the base of the skull through  the vertex without intravenous contrast. COMPARISON:  07/16/2015 and 08/28/2014 CT. FINDINGS: Brain: No intracranial hemorrhage or CT evidence of large acute infarct. Remote anterior right thalamic infarct and remote small right cerebellar infarct. Chronic microvascular changes. Global atrophy without hydrocephalus. No intracranial mass lesion noted on this unenhanced exam. Expanded partially empty sella without change. Vascular: Vascular calcifications. Skull: No acute abnormality. Sinuses/Orbits: No acute orbital abnormality. Visualized paranasal sinuses are clear. Other: Mastoid air cells and middle ear cavities are clear. IMPRESSION: No intracranial hemorrhage or CT evidence of large acute infarct. 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 Chronic microvascular changes. Global atrophy. Electronically Signed   By: Genia Del M.D.   On: 08/29/2016 17:54   US Venous Img Lower Unilateral Left  Result Date: 08/29/2016 CLINICAL DATA:  83 year old female with swelling for the past 3 days. Initial encounter. EXAM: Left LOWER EXTREMITY VENOUS DOPPLER ULTRASOUND TECHNIQUE: Gray-scale sonography with graded compression, as well as color Doppler and duplex ultrasound were performed to evaluate the lower extremity deep venous systems from the level of the common femoral vein and including the common femoral, femoral, profunda femoral, popliteal and calf veins including the posterior tibial, peroneal and gastrocnemius veins when visible. Spectral Doppler was utilized to evaluate flow at rest and with distal augmentation maneuvers in the common femoral, femoral and popliteal veins. COMPARISON:  None. FINDINGS: Contralateral Common Femoral Vein: Respiratory phasicity is normal and symmetric with the symptomatic side. No evidence of thrombus. Normal compressibility. Common Femoral Vein: No evidence of thrombus. Normal compressibility,  respiratory phasicity and response to augmentation. Saphenofemoral  Junction: No evidence of thrombus. Normal compressibility and flow on color Doppler imaging. Profunda Femoral Vein: No evidence of thrombus. Normal compressibility and flow on color Doppler imaging. Femoral Vein: No evidence of thrombus. Normal compressibility, respiratory phasicity and response to augmentation. Popliteal Vein: No evidence of thrombus. Normal compressibility, respiratory phasicity and response to augmentation. Calf Veins: No evidence of thrombus. Normal compressibility and flow on color Doppler imaging. Other Findings:  Edema calf level. IMPRESSION: No evidence of DVT within the left lower extremity. Electronically Signed   By: Genia Del M.D.   On: 08/29/2016 18:49   US Venous Img Upper Uni Left  Result Date: 08/29/2016 CLINICAL DATA:  Left extremity swelling for a 3 days EXAM: LEFT UPPER EXTREMITY VENOUS DOPPLER ULTRASOUND TECHNIQUE: Gray-scale sonography with graded compression, as well as color Doppler and duplex ultrasound were performed to evaluate the upper extremity deep venous system from the level of the subclavian vein and including the jugular, axillary, basilic, radial, ulnar and upper cephalic vein. Spectral Doppler was utilized to evaluate flow at rest and with distal augmentation maneuvers. COMPARISON:  None. FINDINGS: Contralateral Subclavian Vein: Respiratory phasicity is normal and symmetric with the symptomatic side. No evidence of thrombus. Normal compressibility. Internal Jugular Vein: No evidence of thrombus. Normal compressibility, respiratory phasicity and response to augmentation. Subclavian Vein: No evidence of thrombus. Normal compressibility, respiratory phasicity and response to augmentation. Axillary Vein: No evidence of thrombus. Normal compressibility, respiratory phasicity and response to augmentation. Cephalic Vein: No evidence of thrombus. Normal compressibility, respiratory phasicity and response to augmentation. Basilic Vein: No evidence of thrombus.  Normal compressibility, respiratory phasicity and response to augmentation. Brachial Veins: No evidence of thrombus. Normal compressibility, respiratory phasicity and response to augmentation. Radial Veins: No evidence of thrombus. Normal compressibility, respiratory phasicity and response to augmentation. Ulnar Veins: No evidence of thrombus. Normal compressibility, respiratory phasicity and response to augmentation. Venous Reflux:  None visualized. Other Findings:  None visualized. IMPRESSION: No evidence of DVT within the left upper extremity. Electronically Signed   By: Abelardo Diesel M.D.   On: 08/29/2016 18:23    Medications: I have reviewed the patient's current medications.  Assessment/Plan: 1. Acute on chronic respiratory failure. Likely secondary to CHF exacerbation. Currently satting 100% on 2 L. We'll try to wean oxygen today. 2. Elevated troponin. Likely demand ischemia.  3. CHF exacerbation. Patient currently being diuresed. Mild improvement. Still has pain radiating to diuresis. 4. Hypertension. Blood pressure stable 5. Disposition. Physical therapy was recommending [place]. Family [place] agreeable.  Total time spent 20 minutes  LOS: 2 days   Baxter Hire 08/31/2016, 9:29 AM

## 2016-08-31 NOTE — NC FL2 (Signed)
[place] LEVEL OF CARE SCREENING TOOL     IDENTIFICATION  Patient Name: MONCERRAT BURNSTEIN Birthdate: [birth year] Sex: female Admission Date (Current Location): 08/29/2016  [place] and [place] Number:  Engineering geologist and Address:  [facility], [address], [place], [place] [address]      Provider Number: [id number]  Attending Physician Name and Address:  Baxter Hire, MD  Relative Name and Phone Number:  Harris,[name] Daughter [phone]     Current Level of Care: Hospital Recommended Level of Care: [place] Prior Approval Number:    Date Approved/Denied:   PASRR Number: [id number]  Discharge Plan: SNF    Current Diagnoses: Patient Active Problem List   Diagnosis Date Noted  . Malnutrition ([place]) 08/30/2016  . Anemia 08/30/2016  . Acute respiratory distress 08/30/2016  . Acute on chronic diastolic CHF (congestive heart failure) ([place]) 08/29/2016  . Aortic valve stenosis, moderate 05/03/2016  . Enterococcal bacteremia 03/30/2016  . Viral upper respiratory tract infection   . Acute back pain   . Sepsis ([place])   . Elevated troponin   . CAP (community acquired pneumonia) 03/28/2016  . Leg swelling 09/14/2015  . Essential hypertension   . Hyperlipidemia   . Diabetes mellitus without complication ([place])   . Paroxysmal SVT (supraventricular tachycardia) ([place])   . Multinodular goiter   . Carotid arterial disease ([place])   . Hypotension   . NSTEMI (non-ST elevated myocardial infarction) ([place]) 09/10/2014  . Renal failure 08/28/2014  . SVT (supraventricular tachycardia) ([place]) 12/17/2012  . Diabetes mellitus, type 2 ([place]) 12/17/2012  . SOB (shortness of breath) 12/17/2012  . Essential hypertension, malignant 12/17/2012  . Chronic diastolic CHF (congestive heart failure) ([place]) 12/17/2012    Orientation RESPIRATION BLADDER Height & Weight     Self, Time, Situation, Place  O2 (2L) Continent Weight: 149 lb 4  oz (67.7 kg) Height:  5' (152.4 cm)  BEHAVIORAL SYMPTOMS/MOOD NEUROLOGICAL BOWEL NUTRITION STATUS      Continent Diet (Dypshagia 2 diet)  AMBULATORY STATUS COMMUNICATION OF NEEDS Skin   Limited Assist Verbally Normal                       Personal Care Assistance Level of Assistance  Bathing, Dressing, Feeding Bathing Assistance: Limited assistance Feeding assistance: Independent Dressing Assistance: Limited assistance     Functional Limitations Info  Sight, Hearing, Speech Sight Info: Adequate Hearing Info: Adequate Speech Info: Adequate    SPECIAL CARE FACTORS FREQUENCY  PT (By licensed PT), OT (By licensed OT), Speech therapy     PT Frequency: 5x a week OT Frequency: 5x a week     Speech Therapy Frequency: 2x a week      Contractures Contractures Info: Not present    Additional Factors Info  Allergies, Code Status, Insulin Sliding Scale Code Status Info: Full Code Allergies Info: METFORMIN AND RELATED   Insulin Sliding Scale Info: insulin aspart (novoLOG) injection 0-9 Units 3x a day with meals.       Current Medications (08/31/2016):  This is the current hospital active medication list Current Facility-Administered Medications  Medication Dose Route Frequency Provider Last Rate Last Dose  . 0.9 %  sodium chloride infusion  250 mL Intravenous PRN Demetrios Loll, MD      . acetaminophen (TYLENOL) tablet 650 mg  650 mg Oral Q6H PRN Demetrios Loll, MD       Or  . acetaminophen (TYLENOL) suppository 650 mg  650 mg  Rectal Q6H PRN Demetrios Loll, MD      . albuterol (PROVENTIL) (2.5 MG/3ML) 0.083% nebulizer solution 2.5 mg  2.5 mg Nebulization Q2H PRN Demetrios Loll, MD      . allopurinol (ZYLOPRIM) tablet 300 mg  300 mg Oral Daily Demetrios Loll, MD   300 mg at 08/31/16 0945  . aspirin EC tablet 81 mg  81 mg Oral Daily Demetrios Loll, MD   81 mg at 08/31/16 0946  . bisacodyl (DULCOLAX) EC tablet 5 mg  5 mg Oral Daily PRN Demetrios Loll, MD      . brimonidine (ALPHAGAN) 0.15 % ophthalmic  solution 1 drop  1 drop Right Eye BID Demetrios Loll, MD   1 drop at 08/31/16 0945  . dorzolamide-timolol (COSOPT) 22.3-6.8 MG/ML ophthalmic solution 1 drop  1 drop Right Eye BID Demetrios Loll, MD   1 drop at 08/31/16 0946  . enoxaparin (LOVENOX) injection 40 mg  40 mg Subcutaneous Q24H Demetrios Loll, MD   40 mg at 08/30/16 2101  . feeding supplement (ENSURE ENLIVE) (ENSURE ENLIVE) liquid 237 mL  237 mL Oral BID BM Demetrios Loll, MD   237 mL at 08/31/16 1000  . furosemide (LASIX) injection 40 mg  40 mg Intravenous Q12H Demetrios Loll, MD   40 mg at 08/31/16 0946  . gabapentin (NEURONTIN) capsule 300 mg  300 mg Oral BID Demetrios Loll, MD   300 mg at 08/31/16 0946  . HYDROcodone-acetaminophen (NORCO/VICODIN) 5-325 MG per tablet 1 tablet  1 tablet Oral Q6H PRN Baxter Hire, MD      . insulin aspart (novoLOG) injection 0-5 Units  0-5 Units Subcutaneous QHS Demetrios Loll, MD      . insulin aspart (novoLOG) injection 0-9 Units  0-9 Units Subcutaneous TID WC Demetrios Loll, MD   1 Units at 08/31/16 1256  . iron polysaccharides (NIFEREX) capsule 150 mg  150 mg Oral BID Demetrios Loll, MD   150 mg at 08/31/16 0946  . isosorbide mononitrate (IMDUR) 24 hr tablet 30 mg  30 mg Oral Daily Demetrios Loll, MD   30 mg at 08/30/16 1023  . ketorolac (TORADOL) 15 MG/ML injection 15 mg  15 mg Intravenous Q6H PRN Demetrios Loll, MD      . latanoprost (XALATAN) 0.005 % ophthalmic solution 1 drop  1 drop Right Eye Cherene Julian, MD   1 drop at 08/30/16 2101  . lisinopril (PRINIVIL,ZESTRIL) tablet 40 mg  40 mg Oral QHS Demetrios Loll, MD   40 mg at 08/30/16 2102  . LORazepam (ATIVAN) tablet 0.5 mg  0.5 mg Oral BID Baxter Hire, MD      . metoprolol succinate (TOPROL-XL) 24 hr tablet 25 mg  25 mg Oral Daily Baxter Hire, MD      . ondansetron [facility]) tablet 4 mg  4 mg Oral Q6H PRN Demetrios Loll, MD       Or  . ondansetron [facility]) injection 4 mg  4 mg Intravenous Q6H PRN Demetrios Loll, MD      . pantoprazole (PROTONIX) EC tablet 40 mg  40 mg Oral BID Demetrios Loll, MD   40 mg at 08/31/16 0946  . pilocarpine (PILOCAR) 4 % ophthalmic solution 1 drop  1 drop Right Eye QID Demetrios Loll, MD   1 drop at 08/31/16 0947  . potassium chloride (K-DUR) CR tablet 10 mEq  10 mEq Oral Daily Demetrios Loll, MD   10 mEq at 08/31/16 0945  . pravastatin (PRAVACHOL) tablet 40 mg  40  mg Oral q1800 Demetrios Loll, MD   40 mg at 08/30/16 1745  . senna-docusate (Senokot-S) tablet 1 tablet  1 tablet Oral QHS PRN Demetrios Loll, MD      . sodium chloride flush (NS) 0.9 % injection 3 mL  3 mL Intravenous Q12H Demetrios Loll, MD   3 mL at 08/31/16 0949  . sodium chloride flush (NS) 0.9 % injection 3 mL  3 mL Intravenous PRN Demetrios Loll, MD      . traZODone (DESYREL) tablet 100 mg  100 mg Oral QHS Demetrios Loll, MD   100 mg at 08/30/16 2101     Discharge Medications: Please see discharge summary for a list of discharge medications.  Relevant Imaging Results:  Relevant Lab Results:   Additional Information SSN [id number]     Ross Ludwig, [place]

## 2016-08-31 NOTE — Plan of Care (Signed)
Problem: Education: Goal: Knowledge of [facility] Education information/materials will improve Outcome: Progressing Patient admitted for SOB, cough and CHF excerebration. Plan of care includes contionous cardiopulmonary monitoring, IV lasix, fall precautions, blood sugar checks, q2 hr turns and all other health conditions will be monitored while in the hospital.  Problem: Health Behavior/Discharge Planning: Goal: Ability to manage health-related needs will improve Outcome: Progressing Patient will be free from injury and falls during the shift. Bed in lowest position, fall precautions  Problem: Pain Managment: Goal: General experience of comfort will improve Outcome: Progressing Pt will be free from pain and will be given PRN pain medication per MD orders  Problem: Skin Integrity: Goal: Risk for impaired skin integrity will decrease Patient skin will remain  Clean, dry and intact. q2 hr turn to keep off of bony prominences, legs elevated. Patient will not have any new skin breakdown while in the hospital.  Problem: Cardiac: Goal: Ability to achieve and maintain adequate cardiopulmonary perfusion will improve Outcome: Progressing Patient will remain hemodynamically stable while in the hospital. Monitor fluid in/output. IV lasix to be given

## 2016-09-01 DIAGNOSIS — E46 Unspecified protein-calorie malnutrition: Secondary | ICD-10-CM

## 2016-09-01 DIAGNOSIS — I10 Essential (primary) hypertension: Secondary | ICD-10-CM

## 2016-09-01 LAB — GLUCOSE, CAPILLARY
Glucose-Capillary: 74 mg/dL (ref 65–99)
Glucose-Capillary: 142 mg/dL — ABNORMAL HIGH (ref 65–99)
Glucose-Capillary: 143 mg/dL — ABNORMAL HIGH (ref 65–99)
Glucose-Capillary: 165 mg/dL — ABNORMAL HIGH (ref 65–99)

## 2016-09-01 NOTE — Progress Notes (Signed)
PT Cancellation Note  Patient Details Name: [name] MRN: [id number] DOB: [birth year]   Cancelled Treatment:    Reason Eval/Treat Not Completed:  (Treatment session attempted. Patient currently eating lunch; will re-attempt at later time/date as patient available.)   Meagan Spease H. Owens Shark, PT, DPT, NCS 09/01/16, 12:21 PM [phone]

## 2016-09-01 NOTE — Progress Notes (Signed)
Physical Therapy Treatment Patient Details Name: [name] MRN: [id number] DOB: [birth year] Today's Date: 09/01/2016    History of Present Illness [name]  is a 83 y.o. female with a known history ofChronic respiratory failure on home oxygen 2 L, Chronic diastolic CHF, carotid arterial disease, cognitive impairment, cervical spine arthritis, anemia, hypertension, diabetes and hyperlipidemia. She is awake but not good historian. Per family, She has had worsening shortness breath and leg swelling for the past one week. Her Lasix was decreased one week ago. She also had some left arm swelling Chest x-ray show CHF. The patient's family also mentioned that patient has had little facial droop on the right side for 4 days. 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 [place] of LUE and LLE negative for DVT.     PT Comments    Pt agreeable to try PT today. Denies pain currently, but pt/family state it takes a great deal of effort to get her to a pain free position; they prefer pt stay in this position to avoid discomfort. Pt participates in long sit/semi reclined Bilateral lower extremity exercises with assist as needed; fatigues quickly. Continue PT to progress strengthening, endurance to improve function toward baseline.    Follow Up Recommendations  SNF     Equipment Recommendations       Recommendations for Other Services       Precautions / Restrictions Precautions Precautions: Fall Restrictions Weight Bearing Restrictions: No    Mobility  Bed Mobility               General bed mobility comments: Not tested; up in chair, positioned more so toward L side in pain free position  Transfers                 General transfer comment: Not tested; pt/family wish pt to stay in this position, as she is not having pain, which is difficult for to attain per pt/family  Ambulation/Gait                 Stairs            Wheelchair Mobility    Modified Rankin  (Stroke Patients Only)       Balance                                            Cognition Arousal/Alertness: Awake/alert Behavior During Therapy: WFL for tasks assessed/performed Overall Cognitive Status: Within Functional Limits for tasks assessed                                        Exercises General Exercises - Lower Extremity Ankle Circles/Pumps: AROM;AAROM;Both;20 reps Quad Sets: Strengthening;Both;15 reps Gluteal Sets: Strengthening;Both;15 reps Short Arc Quad: AAROM;AROM;Both;15 reps Heel Slides: AAROM;Both;15 reps (tolerates mild resistance with extension) Hip ABduction/ADduction: AAROM;Both;15 reps    General Comments        Pertinent Vitals/Pain Pain Assessment: No/denies pain    Home Living                      Prior Function            PT Goals (current goals can now be found in the care plan section) Progress towards PT goals: Not progressing toward goals - comment  Frequency    Min 2X/week      PT Plan Current plan remains appropriate    Co-evaluation              AM-PAC PT "6 Clicks" Daily Activity  Outcome Measure  Difficulty turning over in bed (including adjusting bedclothes, sheets and blankets)?: Total Difficulty moving from lying on back to sitting on the side of the bed? : Total Difficulty sitting down on and standing up from a chair with arms (e.g., wheelchair, bedside commode, etc,.)?: Total Help needed moving to and from a bed to chair (including a wheelchair)?: Total Help needed walking in hospital room?: Total Help needed climbing 3-5 steps with a railing? : Total 6 Click Score: 6    End of Session Equipment Utilized During Treatment: Oxygen Activity Tolerance: Patient limited by fatigue Patient left: in chair;with call bell/phone within reach;with chair alarm set;with family/visitor present   PT Visit Diagnosis: Muscle weakness (generalized) (M62.81);Difficulty in  walking, not elsewhere classified (R26.2)     Time: 1624-4695 PT Time Calculation (min) (ACUTE ONLY): 20 min  Charges:  $Therapeutic Exercise: 8-22 mins                    G Codes:        [name], PTA 09/01/2016, 2:24 PM

## 2016-09-01 NOTE — Progress Notes (Signed)
Subjective: Patient complained about shortness of breath.  Objective: Vital signs in last 24 hours: Temp:  [97.8 F (36.6 C)-98.4 F (36.9 C)] 98.1 F (36.7 C) (07/05 0520) Pulse Rate:  [69-79] 72 (07/05 0520) Resp:  [18-20] 20 (07/05 0520) BP: (118-141)/(27-38) 118/31 (07/05 0520) SpO2:  [94 %-100 %] 100 % (07/05 0520) Weight:  [69.7 kg (153 lb 11.2 oz)] 69.7 kg (153 lb 11.2 oz) (07/05 0500) Weight change: 2.018 kg (4 lb 7.2 oz) Last BM Date: 08/29/16  Intake/Output from previous day: 07/04 0701 - 07/05 0700 In: 240 [P.O.:240] Out: 2350 [Urine:2350] Intake/Output this shift: No intake/output data recorded.  General appearance: no distress Resp: clear to auscultation bilaterally Cardio: Regular rate and rhythm 2/6 systolic murmur GI: soft, non-tender; bowel sounds normal; no masses,  no organomegaly  Lab Results:  Recent Labs  08/29/16 1556  WBC 7.6  HGB 10.3*  HCT 31.3*  PLT 193   BMET  Recent Labs  08/30/16 0512 08/31/16 0446  NA 143 145  K 3.5 3.7  CL 103 101  CO2 36* 36*  GLUCOSE 67 80  BUN 17 17  CREATININE 0.61 0.76  CALCIUM 9.0 9.3    Studies/Results: No results found.  Medications: I have reviewed the patient's current medications.  Assessment/Plan: 1. Acute on chronic respiratory failure. Likely secondary to CHF exacerbation. Improving. Continue oxygen support as needed  2. Elevated troponin. Likely demand ischemia.  3. CHF exacerbation. Lungs sound much better today. We will continue gentle diuresis as she has a severe aortic stenosis do not want to over diurese her. 4. Severe aortic stenosis. Patient does not appear to be a candidate for surgery. Cardiology is also seeing her.. 5. Hypertension. Blood pressure stable 5 Disposition. Physical therapy was recommending SNIF. Should be stable for that transition back tomorrow.  Total time spent 20 minutes  LOS: 3 days   Baxter Hire 09/01/2016, 8:19 AM

## 2016-09-01 NOTE — Progress Notes (Signed)
Visit made. Patient seen sitting up in the recliner eyes closed. She did respond when spoken to and drifted back to sleep. Daughter's [name] and Renee at bedside. [name] reports that her mother has been complaining of continued "chest discomfort" and shortness of breath. Per chart note review and discussion with [name] they are leaning towards discharge to a STR facility. Dr. Rockey Situ in during visit. [name] relayed her mother's symptoms to him as well. He did discuss the very limited options available for any "cure" at this time. [name] voiced her understanding. There may be an option to pursue a noninvasive procedure, however Dr. Rockey Situ made this very clear that there was a very, very small chance that Mrs. [name] would qualify for this.  Writer was able to discuss the revocation of hospice benefit, [name] voiced her understanding that this would be in effect when [name] discharged from [facility] to rehab. Will continue to follow and update hospice team. Thank you. Flo Shanks RN, BSN, [facility] [place], hospital Liaison [phone] c

## 2016-09-01 NOTE — Care Management Important Message (Signed)
Important Message  Patient Details  Name: [name] MRN: [id number] Date of Birth: 09-May-1924   Medicare Important Message Given:  Yes  Signed IM notice given   Katrina Stack, RN 09/01/2016, 3:47 PM

## 2016-09-01 NOTE — Clinical Social Work Note (Addendum)
CSW attempted to contact patient's daughter to present bed offers, left a message awaiting for call back from daughter.  1:30pm  CSW spoke to patient's daughter [name] and presented bed offers, patient will discuss options with her sister to make final decision and then contact this CSW back.  2:45pm  CSW received phone call back from patient's daughter and they would like [facility].  CSW contacted [facility] who said they can accept patient tomorrow.  CSW received insurance authorization number of [id number].  Jones Broom. [place], MSW, [place]  09/01/2016 10:11 AM

## 2016-09-01 NOTE — Progress Notes (Signed)
Progress Note  Patient Name: [name] Date of Encounter: 09/01/2016  Primary Cardiologist: Rockey Situ, tim  Subjective   Patient in a recliner on her side, minimally conversant, able to open her eyes with stimulation Will answer questions with yes or no Family at the bedside, after some discussion, daughter had to leave her dentist appointment    Inpatient Medications    Scheduled Meds: . allopurinol  300 mg Oral Daily  . aspirin EC  81 mg Oral Daily  . brimonidine  1 drop Right Eye BID  . dorzolamide-timolol  1 drop Right Eye BID  . enoxaparin (LOVENOX) injection  40 mg Subcutaneous Q24H  . feeding supplement (ENSURE ENLIVE)  237 mL Oral BID BM  . furosemide  40 mg Intravenous Q12H  . gabapentin  300 mg Oral BID  . insulin aspart  0-5 Units Subcutaneous QHS  . insulin aspart  0-9 Units Subcutaneous TID WC  . iron polysaccharides  150 mg Oral BID  . isosorbide mononitrate  30 mg Oral Daily  . latanoprost  1 drop Right Eye QHS  . lisinopril  40 mg Oral QHS  . LORazepam  0.5 mg Oral BID  . metoprolol succinate  25 mg Oral Daily  . pantoprazole  40 mg Oral BID  . pilocarpine  1 drop Right Eye QID  . potassium chloride  10 mEq Oral Daily  . pravastatin  40 mg Oral q1800  . sodium chloride flush  3 mL Intravenous Q12H  . traZODone  100 mg Oral QHS   Continuous Infusions: . sodium chloride     PRN Meds: sodium chloride, acetaminophen **OR** acetaminophen, albuterol, bisacodyl, HYDROcodone-acetaminophen, ketorolac, ondansetron **OR** ondansetron (ZOFRAN) IV, senna-docusate, sodium chloride flush   Vital Signs    Vitals:   09/01/16 0520 09/01/16 0800 09/01/16 1153 09/01/16 1356  BP: (!) 118/31 (!) 148/34 (!) 118/30   Pulse: 72 73 68 67  Resp: 20 18 20    Temp: 98.1 F (36.7 C) 98.4 F (36.9 C)    TempSrc: Oral Axillary    SpO2: 100% 98% 98% 100%  Weight:      Height:        Intake/Output Summary (Last 24 hours) at 09/01/16 1824 Last data filed at 09/01/16  1600  Gross per 24 hour  Intake                0 ml  Output             3150 ml  Net            -3150 ml   Filed Weights   08/30/16 0602 08/31/16 0555 09/01/16 0500  Weight: 149 lb 10.3 oz (67.9 kg) 149 lb 4 oz (67.7 kg) 153 lb 11.2 oz (69.7 kg)    Telemetry      Physical Exam   GEN: No acute distress.   Neck: No JVD Cardiac: RRR, no murmurs, rubs, or gallops.  Respiratory: Clear to auscultation bilaterally. GI: Soft, nontender, non-distended  MS: No edema; No deformity. Neuro:  Nonfocal  Psych: Normal affect   Labs    Chemistry Recent Labs Lab 08/29/16 1556 08/30/16 0512 08/31/16 0446  NA 140 143 145  K 3.1* 3.5 3.7  CL 100* 103 101  CO2 31 36* 36*  GLUCOSE 87 67 80  BUN 19 17 17   CREATININE 0.62 0.61 0.76  CALCIUM 9.5 9.0 9.3  PROT  --  5.4*  --   ALBUMIN  --  3.0*  --  AST  --  25  --   ALT  --  20  --   ALKPHOS  --  30*  --   BILITOT  --  0.9  --   GFRNONAA >60 >60 >60  GFRAA >60 >60 >60  ANIONGAP 9 4* 8     Hematology Recent Labs Lab 08/29/16 1556  WBC 7.6  RBC 3.47*  HGB 10.3*  HCT 31.3*  MCV 90.3  MCH 29.8  MCHC 32.9  RDW 17.4*  PLT 193    Cardiac Enzymes Recent Labs Lab 08/29/16 1556  TROPONINI 0.07*   No results for input(s): TROPIPOC in the last 168 hours.   BNP Recent Labs Lab 08/29/16 1556  BNP 1,576.0*     DDimer No results for input(s): DDIMER in the last 168 hours.   Radiology    No results found.  Cardiac Studies   Previous echocardiogram showing significant aortic valve stenosis  Patient Profile     Principal Problem:   Acute on chronic diastolic CHF (congestive heart failure) (HCC) Active Problems:   Elevated troponin   Aortic valve stenosis, moderate   Malnutrition (HCC)   Anemia   Acute respiratory distress   Diabetes mellitus, type 2 (HCC)   Essential hypertension   Assessment & Plan    1. Acute on chronic diastolic CHF: Pulmonary hypertension on previous echocardiogram Would continue  Lasix cautiously given hypotension with overdiuresis Currently on Lasix 40 IV twice a day  2. Elevated troponin:  supply demand ischemia in the setting of anemia, volume overload, and AS No further testing at this time  3.  aortic valve stenosis: -By TEE in 03/2016, was moderate By transthoracic echo was severe If her conditioning were to dramatically improve and she was felt to be a viable candidate for TAVR, would repeat echocardiogram ------ discussion with daughter at the bedside, I expressed significant reservations concerning her candidacy for aortic valve surgery. Shortness of breath, labile blood pressure likely secondary to significant aortic valve stenosis  4. Malnutrition/generalized malaise/fatgiue: Poor health, Has been under Hospice care Likely will have limited recovery with skilled nursing facility Underlying malnutrition, anemia, pulmonary HTN with possible worsening of RV systolic function, and deconditioning   5. Labile HTN: -Stop Cardura with titration of her hydralazine to 50 mg tid -Consider placing patient on her home dose of Toprol which is 75 mg daily -She is on Imdur 60 mg at home and 30 mg here, monitor BP and titrate as needed  6. Anemia: -On iron supplements Stable  7. Difficulty swallowing/belching: Per the notes   Total encounter time more than 35 minutes  Greater than 50% was spent in counseling and coordination of care with the patient   Signed, Ida Rogue, MD  09/01/2016, 6:24 PM

## 2016-09-01 NOTE — Progress Notes (Signed)
Occupational Therapy Treatment Patient Details Name: [name] MRN: [id number] DOB: 11/30/1924 Today's Date: 09/01/2016    History of present illness [name]  is a 83 y.o. female with a known history ofChronic respiratory failure on home oxygen 2 L, Chronic diastolic CHF, carotid arterial disease, cognitive impairment, cervical spine arthritis, anemia, hypertension, diabetes and hyperlipidemia. She is awake but not good historian. Per family, She has had worsening shortness breath and leg swelling for the past one week. Her Lasix was decreased one week ago. She also had some left arm swelling Chest x-ray show CHF. The patient's family also mentioned that patient has had little facial droop on the right side for 4 days. 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 [place] of LUE and LLE negative for DVT.    OT comments  Pt. Continues to present with weakness, limited activity tolerance, and impaired functional mobility which hinders ADL, and IADL performance. Pt./caregiver education was provided about energy conservation, work simplification techniques during ADLs, and IADL tasks. Pt. was provided with a visual handout. Pt. Continues to benefit from skilled OT services to improve ADL functioning, and review and energy conservation/work simplification techniques. Pt. continues to be appropriate for SNF level of care with follow-up OT services.   Follow Up Recommendations  SNF    Equipment Recommendations       Recommendations for Other Services      Precautions / Restrictions Precautions Precautions: Fall Restrictions Weight Bearing Restrictions: No                                                     ADL either performed or assessed with clinical judgement   ADL Overall ADL's : Needs assistance/impaired Eating/Feeding: Modified independent       Upper Body Bathing: Minimal assistance   Lower Body Bathing: Moderate assistance   Upper Body Dressing : Minimal  assistance                   Functional mobility during ADLs: Moderate assistance General ADL Comments: Pt./caregiver education was provided about A/E use for LE ADLs.     Vision Patient Visual Report: No change from baseline     Perception     Praxis      Cognition Arousal/Alertness: Awake/alert Behavior During Therapy: WFL for tasks assessed/performed Overall Cognitive Status: Within Functional Limits for tasks assessed                                             Shoulder Instructions       General Comments      Pertinent Vitals/ Pain       Pain Assessment: No/denies pain Pain Intervention(s): Limited activity within patient's tolerance;Monitored during session;Repositioned  Home Living                                          Prior Functioning/Environment              Frequency  Min 1X/week        Progress Toward Goals  OT Goals(current goals can now be found in the care plan  section)  Progress towards OT goals: Progressing toward goals  Acute Rehab OT Goals Patient Stated Goal: to do everything she can to get better OT Goal Formulation: With patient/family Potential to Achieve Goals: Good  Plan Discharge plan remains appropriate    Co-evaluation          OT goals addressed during session: ADL's and self-care      AM-PAC PT "6 Clicks" Daily Activity     Outcome Measure   Help from another person eating meals?: None Help from another person taking care of personal grooming?: None Help from another person toileting, which includes using toliet, bedpan, or urinal?: A Lot Help from another person bathing (including washing, rinsing, drying)?: A Lot Help from another person to put on and taking off regular upper body clothing?: A Little Help from another person to put on and taking off regular lower body clothing?: A Lot 6 Click Score: 17    End of Session Equipment Utilized During Treatment:  Gait belt  OT Visit Diagnosis: Muscle weakness (generalized) (M62.81)   Activity Tolerance Patient tolerated treatment well;Patient limited by fatigue   Patient Left in bed;with call bell/phone within reach;with family/visitor present;with bed alarm set   Nurse Communication          Time: 8871-9597 OT Time Calculation (min): 23 min  Charges: OT General Charges $OT Visit: 1 Procedure OT Treatments $Self Care/Home Management : 23-37 mins  Harrel Carina, MS, OTR/L  Harrel Carina, MS, OTR/L 09/01/2016, 4:03 PM

## 2016-09-02 DIAGNOSIS — E118 Type 2 diabetes mellitus with unspecified complications: Secondary | ICD-10-CM | POA: Diagnosis not present

## 2016-09-02 DIAGNOSIS — I10 Essential (primary) hypertension: Secondary | ICD-10-CM | POA: Diagnosis not present

## 2016-09-02 DIAGNOSIS — N189 Chronic kidney disease, unspecified: Secondary | ICD-10-CM | POA: Diagnosis not present

## 2016-09-02 DIAGNOSIS — E785 Hyperlipidemia, unspecified: Secondary | ICD-10-CM | POA: Diagnosis not present

## 2016-09-02 DIAGNOSIS — H409 Unspecified glaucoma: Secondary | ICD-10-CM | POA: Diagnosis not present

## 2016-09-02 DIAGNOSIS — J9620 Acute and chronic respiratory failure, unspecified whether with hypoxia or hypercapnia: Secondary | ICD-10-CM | POA: Diagnosis not present

## 2016-09-02 DIAGNOSIS — I471 Supraventricular tachycardia: Secondary | ICD-10-CM | POA: Diagnosis not present

## 2016-09-02 DIAGNOSIS — I5043 Acute on chronic combined systolic (congestive) and diastolic (congestive) heart failure: Secondary | ICD-10-CM | POA: Diagnosis not present

## 2016-09-02 DIAGNOSIS — E43 Unspecified severe protein-calorie malnutrition: Secondary | ICD-10-CM | POA: Diagnosis not present

## 2016-09-02 DIAGNOSIS — M109 Gout, unspecified: Secondary | ICD-10-CM | POA: Diagnosis not present

## 2016-09-02 DIAGNOSIS — K5900 Constipation, unspecified: Secondary | ICD-10-CM | POA: Diagnosis not present

## 2016-09-02 DIAGNOSIS — J9621 Acute and chronic respiratory failure with hypoxia: Secondary | ICD-10-CM | POA: Diagnosis not present

## 2016-09-02 DIAGNOSIS — E1142 Type 2 diabetes mellitus with diabetic polyneuropathy: Secondary | ICD-10-CM | POA: Diagnosis not present

## 2016-09-02 DIAGNOSIS — K219 Gastro-esophageal reflux disease without esophagitis: Secondary | ICD-10-CM | POA: Diagnosis not present

## 2016-09-02 DIAGNOSIS — E782 Mixed hyperlipidemia: Secondary | ICD-10-CM | POA: Diagnosis not present

## 2016-09-02 DIAGNOSIS — R1312 Dysphagia, oropharyngeal phase: Secondary | ICD-10-CM | POA: Diagnosis not present

## 2016-09-02 DIAGNOSIS — I129 Hypertensive chronic kidney disease with stage 1 through stage 4 chronic kidney disease, or unspecified chronic kidney disease: Secondary | ICD-10-CM | POA: Diagnosis not present

## 2016-09-02 DIAGNOSIS — I350 Nonrheumatic aortic (valve) stenosis: Secondary | ICD-10-CM | POA: Diagnosis not present

## 2016-09-02 DIAGNOSIS — E119 Type 2 diabetes mellitus without complications: Secondary | ICD-10-CM | POA: Diagnosis not present

## 2016-09-02 DIAGNOSIS — F5101 Primary insomnia: Secondary | ICD-10-CM | POA: Diagnosis not present

## 2016-09-02 DIAGNOSIS — I5030 Unspecified diastolic (congestive) heart failure: Secondary | ICD-10-CM | POA: Diagnosis not present

## 2016-09-02 DIAGNOSIS — E1122 Type 2 diabetes mellitus with diabetic chronic kidney disease: Secondary | ICD-10-CM | POA: Diagnosis not present

## 2016-09-02 DIAGNOSIS — R498 Other voice and resonance disorders: Secondary | ICD-10-CM | POA: Diagnosis not present

## 2016-09-02 DIAGNOSIS — Z5189 Encounter for other specified aftercare: Secondary | ICD-10-CM | POA: Diagnosis not present

## 2016-09-02 DIAGNOSIS — M6281 Muscle weakness (generalized): Secondary | ICD-10-CM | POA: Diagnosis not present

## 2016-09-02 DIAGNOSIS — N390 Urinary tract infection, site not specified: Secondary | ICD-10-CM | POA: Diagnosis not present

## 2016-09-02 DIAGNOSIS — I5033 Acute on chronic diastolic (congestive) heart failure: Secondary | ICD-10-CM | POA: Diagnosis not present

## 2016-09-02 DIAGNOSIS — D649 Anemia, unspecified: Secondary | ICD-10-CM | POA: Diagnosis not present

## 2016-09-02 DIAGNOSIS — Z794 Long term (current) use of insulin: Secondary | ICD-10-CM | POA: Diagnosis not present

## 2016-09-02 LAB — GLUCOSE, CAPILLARY
GLUCOSE-CAPILLARY: 93 mg/dL (ref 65–99)
Glucose-Capillary: 143 mg/dL — ABNORMAL HIGH (ref 65–99)

## 2016-09-02 LAB — BASIC METABOLIC PANEL
Anion gap: 6 (ref 5–15)
BUN: 18 mg/dL (ref 6–20)
CALCIUM: 9.4 mg/dL (ref 8.9–10.3)
CO2: 39 mmol/L — AB (ref 22–32)
Chloride: 97 mmol/L — ABNORMAL LOW (ref 101–111)
Creatinine, Ser: 0.67 mg/dL (ref 0.44–1.00)
GFR calc non Af Amer: >60 mL/min (ref >60)
Glucose, Bld: 119 mg/dL — ABNORMAL HIGH (ref 65–99)
Potassium: 3.8 mmol/L (ref 3.5–5.1)
SODIUM: 142 mmol/L (ref 135–145)

## 2016-09-02 MED ORDER — FUROSEMIDE 20 MG PO TABS: 20.0000 mg | ORAL_TABLET | Freq: Every day | ORAL | 11 refills | Status: DC

## 2016-09-02 MED ORDER — METOPROLOL SUCCINATE ER 25 MG PO TB24: 25.0000 mg | ORAL_TABLET | Freq: Every day | ORAL | 0 refills | Status: DC

## 2016-09-02 MED ORDER — HYDROCODONE-ACETAMINOPHEN 5-325 MG PO TABS: 1.0000 | ORAL_TABLET | Freq: Four times a day (QID) | ORAL | 0 refills | Status: DC | PRN

## 2016-09-02 MED ORDER — HYDRALAZINE HCL 25 MG PO TABS: 50.0000 mg | ORAL_TABLET | Freq: Three times a day (TID) | ORAL | 6 refills | Status: DC | PRN

## 2016-09-02 MED ORDER — BISACODYL 10 MG RE SUPP
10.0000 mg | Freq: Once | RECTAL | Status: AC
  Administered 2016-09-02: 10 mg via RECTAL
  Filled 2016-09-02: qty 1

## 2016-09-02 MED ORDER — LORAZEPAM 0.5 MG PO TABS: 0.5000 mg | ORAL_TABLET | Freq: Every day | ORAL | 0 refills | Status: AC

## 2016-09-02 MED ORDER — ISOSORBIDE MONONITRATE ER 30 MG PO TB24: 30.0000 mg | ORAL_TABLET | Freq: Every day | ORAL | 0 refills | Status: DC

## 2016-09-02 NOTE — Discharge Summary (Addendum)
Physician Discharge Summary  Patient ID: [name] MRN: [id number] DOB/AGE: 11-23-1924 83 y.o.  Admit date: 08/29/2016 Discharge date: 09/02/2016  Admission Diagnoses:1. Acute on chronic respiratory failure 2. Elevated troponin 3. CHF exacerbation. Acute on chronic diastolic congestive heart failure 4. Severe aortic stenosis 5. Hypertension    Discharge Diagnoses:  Principal Problem:   Acute on chronic diastolic CHF (congestive heart failure) (HCC) Active Problems:   Diabetes mellitus, type 2 (HCC)   Essential hypertension   Elevated troponin   Aortic valve stenosis, moderate   Malnutrition (HCC)   Anemia   Acute respiratory distress   Discharged Condition: good  Hospital Course: 1. Acute on chronic respiratory failure. Likely secondary to CHF exacerbation. Improving. Patient was weaned down to her home oxygen levels by treatment of her CHF. 2. Elevated troponin. Likely demand ischemia. No further cardiac workup was per cc. 3. CHF exacerbation. Patient was gently diuresed and is now status euvolemic and she is going hip.  4. Severe aortic stenosis. Patient does not appear to be a candidate for surgery. Cardiology has evaluated her. Would like to see her functional status improved to see if she would be a candidate for potential evaluation for surgery. At the careful with fluid and blood pressure management. Seems to be balanced for now. 5. Hypertension.Blood pressure stable 6 Disposition. Discharge and SNIF. Patient will be losing hospice services in order to get a SNIF. Recommend palliative care follow her while she is in SNIF.  Please note and discharge medication list. Toprol XL 25 mg has been changed to 1 tablet once a day.     Consults: cardiology    Discharge Exam: Blood pressure (!) 140/31, pulse 74, temperature 97.6 F (36.4 C), temperature source Oral, resp. rate 18, height 5' (1.524 m), weight 63.6 kg (140 lb 4 oz), SpO2 100 %. General appearance: no  distress Resp: clear to auscultation bilaterally Cardio: Regular rate and rhythm over 6 systolic murmur  Disposition: 01-Home or Self Care  Discharge Instructions    Diet - low sodium heart healthy    Complete by:  As directed    Increase activity slowly    Complete by:  As directed      Allergies as of 09/02/2016      Reactions   Metformin And Related       Medication List    STOP taking these medications   doxazosin 4 MG tablet Commonly known as:  CARDURA   traMADol 50 MG tablet Commonly known as:  ULTRAM     TAKE these medications   acetaminophen 500 MG tablet Commonly known as:  TYLENOL Take 500 mg by mouth every 6 (six) hours as needed.   allopurinol 300 MG tablet Commonly known as:  ZYLOPRIM Take 300 mg by mouth daily.   aspirin 81 MG tablet Take 81 mg by mouth daily.   brimonidine 0.1 % Soln Commonly known as:  ALPHAGAN P Place 1 drop into the right eye 2 (two) times daily.   COSOPT OP Place 1 drop into the right eye 2 (two) times daily.   feeding supplement (ENSURE ENLIVE) Liqd Take 237 mLs by mouth 2 (two) times daily between meals.   furosemide 20 MG tablet Commonly known as:  LASIX Take 1 tablet (20 mg total) by mouth as needed for edema. What changed:  how much to take  when to take this   gabapentin 300 MG capsule Commonly known as:  NEURONTIN Take 300 mg by mouth 2 (two) times daily.  glimepiride 1 MG tablet Commonly known as:  AMARYL Take 1 mg by mouth daily with breakfast.   hydrALAZINE 25 MG tablet Commonly known as:  APRESOLINE Take 2 tablets (50 mg total) by mouth 3 (three) times daily as needed. What changed:  how much to take   HYDROcodone-acetaminophen 5-325 MG tablet Commonly known as:  NORCO/VICODIN Take 1 tablet by mouth every 6 (six) hours as needed for moderate pain.   iron polysaccharides 150 MG capsule Commonly known as:  NIFEREX Take 150 mg by mouth 2 (two) times daily.   isosorbide mononitrate 60 MG 24 hr  tablet Commonly known as:  IMDUR Take 1 tablet (60 mg total) by mouth 2 (two) times daily. What changed:  how much to take   latanoprost 0.005 % ophthalmic solution Commonly known as:  XALATAN Place 1 drop into the right eye at bedtime.   lidocaine 5 % Commonly known as:  LIDODERM Place 1 patch onto the skin daily. Remove & Discard patch within 12 hours or as directed by MD   lisinopril 40 MG tablet Commonly known as:  PRINIVIL,ZESTRIL Take 40 mg by mouth at bedtime.   LORazepam 1 MG tablet Commonly known as:  ATIVAN Take 1 tablet (1 mg total) by mouth 2 (two) times daily.   lovastatin 40 MG tablet Commonly known as:  MEVACOR Take 40 mg by mouth at bedtime.   metoprolol succinate 25 MG 24 hr tablet Commonly known as:  TOPROL-XL Take 3 tablets (75 mg total) by mouth daily. Take with or immediately following a meal.   pantoprazole 40 MG tablet Commonly known as:  PROTONIX Take 40 mg by mouth 2 (two) times daily.   pilocarpine 4 % ophthalmic solution Commonly known as:  PILOCAR Place 1 drop into the right eye 4 (four) times daily.   potassium chloride 10 MEQ tablet Commonly known as:  K-DUR Take 1 tablet (10 mEq total) by mouth daily. Take with lasix   tiZANidine 4 MG tablet Commonly known as:  ZANAFLEX Take 1 tablet by mouth 3 (three) times daily as needed.   traZODone 100 MG tablet Commonly known as:  DESYREL Take 100 mg by mouth at bedtime.   Vitamin D 2000 units Caps Take 2,000 Units by mouth daily.      Contact information for after-discharge care    Destination    [facility] .   Specialty:  [place] information: [address] [place] [phone]              Signed: Baxter Hire 09/02/2016, 8:45 AM

## 2016-09-02 NOTE — Progress Notes (Signed)
IV and tele removed from patient. Discharge instructions given to patient and daughter. Verbalized understanding. No distress at this time. Report called to [facility]. EMS to transport patient.

## 2016-09-02 NOTE — Clinical Social Work Note (Signed)
Patient to be d/c'ed today to [facility].  Patient and family agreeable to plans will transport via ems RN to call report to room [facility] [phone].  Evette Cristal, MSW, [place]

## 2016-09-02 NOTE — Clinical Social Work Placement (Signed)
   CLINICAL SOCIAL WORK PLACEMENT  NOTE  Date:  09/02/2016  Patient Details  Name: [name] MRN: [id number] Date of Birth: [birth year]  Clinical Social Work is seeking post-discharge placement for this patient at the [place] level of care (*CSW will initial, date and re-position this form in  chart as items are completed):  Yes   Patient/family provided with [facility] [address] Work Department's list of facilities offering this level of care within the geographic area requested by the patient (or if unable, by the patient's family).  Yes   Patient/family informed of their freedom to choose among providers that offer the needed level of care, that participate in Medicare, Medicaid or managed care program needed by the patient, have an available bed and are willing to accept the patient.  Yes   Patient/family informed of [facility]'s ownership interest in [facility] and [facility], as well as of the fact that they are under no obligation to receive care at these facilities.  PASRR submitted to EDS on 08/31/16     PASRR number received on       Existing PASRR number confirmed on       FL2 transmitted to all facilities in geographic area requested by pt/family on 08/31/16     FL2 transmitted to all facilities within larger geographic area on       Patient informed that his/her managed care company has contracts with or will negotiate with certain facilities, including the following:        Yes   Patient/family informed of bed offers received.  Patient chooses bed at [facility]     Physician recommends and patient chooses bed at      Patient to be transferred to [facility] on 09/02/16.  Patient to be transferred to facility by [facility]     Patient family notified on 09/02/16 of transfer.  Name of family member notified:  Patient's daughter was at bedside     PHYSICIAN Please sign FL2     Additional  Comment:    _______________________________________________ Ross Ludwig, LCSWA 09/02/2016, 12:14 PM

## 2016-09-02 NOTE — Progress Notes (Signed)
Visit made. Patient seen lying in bed, appeared comfortable, resting quietly, did respond when spoken to. Daughter [name] at bedside. She expressed her concerns about her mother's blood pressure medications related to her discharge, these were addressed with staff RN Serenity, [name] made aware. All questions answered. She is agreeable to [facility]iative following at [facility]. Referral made aware. Hospice team updated to discharge to SNF. Revocation of hospice benefit formed signed. Patient continued resting at end of visit. Oxygen saturations checked at [name]'s request, 99% on  Room air, staff RN Serenity notified. Questions answered, emotional support given. Patient to discharge to [facility] for short term rehab today. Updated notes faxed to triage. Thank you. Flo Shanks RN, BSN, [facility] [place], hospital Liaison [phone]

## 2016-09-05 ENCOUNTER — Telehealth: Payer: Self-pay | Admitting: Cardiovascular Disease

## 2016-09-05 NOTE — Telephone Encounter (Signed)
Care home has a question regarding pt Lasix dosage. Also has some questions regarding other medications. Please call.

## 2016-09-05 NOTE — Telephone Encounter (Signed)
Spoke w/ Kim @ [facility]. Pt was recent d/c'd from [facility] w/ new med orders, but the family reports that the instructions are different from those they received.  [facility] received the following orders: Isosorbide 30 mg - family states should be 60 mg Lasix prn - has not received since Friday - family states should be daily Ativan 1 mg BID - family states should be 0.5 mg QHS, but have been requesting prn 2/2 SOB Lidoderm patch, but pt has not used this since January  Pt's wt on Friday was reported at 140, but they do not think this is correct, as it was done on the hoyer lift, pt was tired and wanted to go to bed rather than reweigh.   Wt today is 126.  She would like to verify w/ Dr. Rockey Situ what meds pt should be on, as there is some confusion as to what was discussed w/ him and w/ hospitalist.  Advised her that I will make Dr. Rockey Situ aware and call her back w/ his recommendation.  Pt sched to be seen 09/21/16, on wait list in the event of a cancellation.

## 2016-09-07 ENCOUNTER — Other Ambulatory Visit
Admission: RE | Admit: 2016-09-07 | Discharge: 2016-09-07 | Disposition: A | Discharge status: Home / Self Care | Payer: Medicare HMO | Source: Ambulatory Visit | Attending: Family Medicine | Admitting: Family Medicine

## 2016-09-07 DIAGNOSIS — J9620 Acute and chronic respiratory failure, unspecified whether with hypoxia or hypercapnia: Secondary | ICD-10-CM | POA: Diagnosis not present

## 2016-09-07 DIAGNOSIS — K219 Gastro-esophageal reflux disease without esophagitis: Secondary | ICD-10-CM | POA: Diagnosis not present

## 2016-09-07 DIAGNOSIS — Z794 Long term (current) use of insulin: Secondary | ICD-10-CM | POA: Diagnosis not present

## 2016-09-07 DIAGNOSIS — I5033 Acute on chronic diastolic (congestive) heart failure: Secondary | ICD-10-CM | POA: Diagnosis not present

## 2016-09-07 DIAGNOSIS — M6281 Muscle weakness (generalized): Secondary | ICD-10-CM | POA: Diagnosis not present

## 2016-09-07 DIAGNOSIS — H409 Unspecified glaucoma: Secondary | ICD-10-CM | POA: Diagnosis not present

## 2016-09-07 DIAGNOSIS — M109 Gout, unspecified: Secondary | ICD-10-CM | POA: Diagnosis not present

## 2016-09-07 DIAGNOSIS — E119 Type 2 diabetes mellitus without complications: Secondary | ICD-10-CM | POA: Diagnosis not present

## 2016-09-07 DIAGNOSIS — I10 Essential (primary) hypertension: Secondary | ICD-10-CM | POA: Diagnosis not present

## 2016-09-07 DIAGNOSIS — I5043 Acute on chronic combined systolic (congestive) and diastolic (congestive) heart failure: Secondary | ICD-10-CM | POA: Insufficient documentation

## 2016-09-07 DIAGNOSIS — I350 Nonrheumatic aortic (valve) stenosis: Secondary | ICD-10-CM | POA: Diagnosis not present

## 2016-09-07 DIAGNOSIS — E785 Hyperlipidemia, unspecified: Secondary | ICD-10-CM | POA: Diagnosis not present

## 2016-09-08 ENCOUNTER — Telehealth: Payer: Self-pay | Admitting: Cardiovascular Disease

## 2016-09-08 NOTE — Telephone Encounter (Signed)
Isosorbide dosing would depend on blood pressure, Would start with 30 mg unless BP elevated We can adjust depending on numbers  Would use lasix daily as needed for for now Would just ask for it Certainly could increase to daily standing dose if fluid intake increases and worsening edema/SOB  Will differ ativan dosing and lidoderm to PCP or SNF doctor at [facility]

## 2016-09-08 NOTE — Telephone Encounter (Signed)
Lmov for patient to be seen sooner.

## 2016-09-09 NOTE — Telephone Encounter (Signed)
Spoke w/ Myriam Jacobson.  Faxed Dr. Donivan Scull recommendation to her attention @ [phone]. Asked her to call back w/ any questions or concerns.

## 2016-09-14 ENCOUNTER — Ambulatory Visit (INDEPENDENT_AMBULATORY_CARE_PROVIDER_SITE_OTHER): Payer: Medicare HMO | Admitting: Nurse Practitioner

## 2016-09-14 ENCOUNTER — Encounter: Payer: Self-pay | Admitting: Nurse Practitioner

## 2016-09-14 VITALS — BP 123/46 | HR 83 | Ht 60.0 in | Wt 127.8 lb

## 2016-09-14 DIAGNOSIS — I471 Supraventricular tachycardia: Secondary | ICD-10-CM

## 2016-09-14 DIAGNOSIS — I350 Nonrheumatic aortic (valve) stenosis: Secondary | ICD-10-CM

## 2016-09-14 DIAGNOSIS — I5030 Unspecified diastolic (congestive) heart failure: Secondary | ICD-10-CM

## 2016-09-14 DIAGNOSIS — I10 Essential (primary) hypertension: Secondary | ICD-10-CM | POA: Diagnosis not present

## 2016-09-14 DIAGNOSIS — E782 Mixed hyperlipidemia: Secondary | ICD-10-CM

## 2016-09-14 NOTE — Patient Instructions (Addendum)
Medication Instructions:  Your physician recommends that you continue on your current medications as directed. Please refer to the Current Medication list given to you today.   Labwork: none  Testing/Procedures: none  Follow-Up: Your physician recommends that you schedule a follow-up appointment in  2-3 months with Dr. Rockey Situ   Any Other Special Instructions Will Be Listed Below (If Applicable). An appointment has been schedule September 5 at 8:30am with Dr. Lauree Chandler [facility] [address] [place] [phone]      If you need a refill on your cardiac medications before your next appointment, please call your pharmacy.

## 2016-09-14 NOTE — Progress Notes (Signed)
Office Visit    Patient Name: [name] Date of Encounter: 09/14/2016  Primary Care Provider:  Tegeler, Loma Sousa, MD Primary Cardiologist:  Johnny Bridge, MD   Chief Complaint    83 year old female with a prior history of moderate to severe aortic stenosis, chronic diastolic heart failure, paroxysmal supraventricular tachycardia, labile hypertension, diabetes, hyperlipidemia, and GERD who presents for follow-up after recent hospitalization for heart failure.  Past Medical History    Past Medical History:  Diagnosis Date  . Anemia   . Bone disease   . Carotid arterial disease ([place])    a. 08/2014 U/S: Bilateral < 50% stenosis. Patent vertebrals w/ antegrade flow.   . Cervical spine arthritis ([place])   . Chronic diastolic CHF (congestive heart failure) ([place])    a. echo 2014: EF 55-60%, DD;  b. 08/2014 Echo: EF 55-60%, no RWMA, GR1DD; c. 02/2016 Echo: EF 65-70%, Gr1 DD.  Marland Kitchen Cognitive impairment   . Depression   . Diabetes mellitus without complication ([place])   . Essential hypertension   . GERD (gastroesophageal reflux disease)   . Glaucoma   . Gout   . Hiatal hernia   . History of renal impairment   . Hyperlipidemia   . Hypotension    a. Related to Norvasc - 11/2014 ED visit.  . Multinodular goiter    a. Noted incidentally on CT 07/2012 and carotid U/S 08/2014;  b. Nl TSH 11/2014.  Marland Kitchen Paroxysmal SVT (supraventricular tachycardia) ([facility])    a. on Toprol   . Prolapsed uterus   . Severe aortic stenosis    a. echo 08/2014: EF 55-60%, no RWMA, GR1DD, severe AS, mild AI, Mean gradient (S): 38 mm Hg. Valve area (VTI): 0.78 cm^2., mild-mod MR, LA mildly dilated, atrial septal aneurysm, mild TR;  b. 02/2016 Echo: EF 65-70%, Gr1 DD, sev AS, mild to mod AI, Valve area (VTI): 0.94 cm^2, (Vmax) 0.96 cm^2, (Vmean) 0.93cm^2, mild MR, mildly dil LA, nl RV fxn, PASP 30mmHg; c. 03/2016 TEE: EF 55-60, mod AS.  Marland Kitchen Stroke [facility])    a. CT head 07/2014 showed small thalamic infarct  . Vitamin deficiency      Past Surgical History:  Procedure Laterality Date  . TEE WITHOUT CARDIOVERSION N/A 03/31/2016   Procedure: TRANSESOPHAGEAL ECHOCARDIOGRAM (TEE);  Surgeon: Minna Merritts, MD;  Location: [facility] ORS;  Service: Cardiovascular;  Laterality: N/A;  . TUBAL LIGATION      Allergies  Allergies  Allergen Reactions  . Metformin And Related     History of Present Illness    83 year old female with the above complex past medical history including moderate to severe aortic stenosis, chronic diastolic congestive heart failure with an EF of 55-60%, labile hypertension, diabetes, hyperlipidemia, paroxysmal supraventricular tachycardia, and GERD. Echocardiogram in January 2018 which suggested severe aortic stenosis. There was an initial plan for referral for TAVR evaluation however she was subsequently admitted due to sepsis in February 2018 and referral was put off. A TEE was performed in February which showed moderate aortic stenosis and recommendation was made for medical therapy. She subsequently had several ER evaluations for malaise, anorexia, chronic pain, and failure to thrive. She was recently hospitalized with acute on chronic diastolic heart failure from July 2 through July 6. During hospitalization, she diuresed well and her discharge weight was 140 pounds. [facility] has been at [facility] ever since discharge and has been rehabilitating well. She has been participating in physical and occupational therapy and per her daughters, she has been  getting stronger. She is ambulating with use of a walker. She does get tired but has not had any chest pain or significant dyspnea on exertion. Per her daughter, her volume status has been well controlled and she is on required 2 doses of when necessary Lasix since her discharge. Her weight today is 127 pounds. Patient says her appetite has been good and denies PND, orthopnea, dizziness, syncope, edema, or early satiety. Family is again interested in referral for TAVR  evaluation to see if she is a candidate.  Home Medications    Prior to Admission medications   Medication Sig Start Date End Date Taking? Authorizing Provider  acetaminophen (TYLENOL) 500 MG tablet Take 500 mg by mouth every 6 (six) hours as needed.   Yes [provider]  allopurinol (ZYLOPRIM) 300 MG tablet Take 300 mg by mouth daily.   Yes [provider]  Amino Acids-Protein Hydrolys (FEEDING SUPPLEMENT, PRO-STAT SUGAR FREE 64,) LIQD Take 30 mLs by mouth 3 (three) times daily with meals.   Yes [provider]  aspirin 81 MG tablet Take 81 mg by mouth daily.   Yes [provider]  brimonidine (ALPHAGAN P) 0.1 % SOLN Place 1 drop into the right eye 2 (two) times daily.   Yes [provider]  Cholecalciferol (VITAMIN D) 2000 units CAPS Take 2,000 Units by mouth daily.    Yes [provider]  Dorzolamide HCl-Timolol Mal (COSOPT OP) Place 1 drop into the right eye 2 (two) times daily.    Yes [provider]  feeding supplement, ENSURE ENLIVE, (ENSURE ENLIVE) LIQD Take 237 mLs by mouth 2 (two) times daily between meals. 04/01/16  Yes Fritzi Mandes, MD  furosemide (LASIX) 20 MG tablet Take 1 tablet (20 mg total) by mouth daily. 09/02/16 09/02/17 Yes Baxter Hire, MD  gabapentin (NEURONTIN) 300 MG capsule Take 300 mg by mouth 2 (two) times daily.   Yes [provider]  glimepiride (AMARYL) 1 MG tablet Take 1 mg by mouth daily with breakfast.   Yes [provider]  HYDROcodone-acetaminophen (NORCO/VICODIN) 5-325 MG tablet Take 1 tablet by mouth every 6 (six) hours as needed for moderate pain. 09/02/16  Yes Baxter Hire, MD  iron polysaccharides (NIFEREX) 150 MG capsule Take 150 mg by mouth 2 (two) times daily.   Yes [provider]  isosorbide mononitrate (IMDUR) 30 MG 24 hr tablet Take 1 tablet (30 mg total) by mouth daily. 09/03/16  Yes Baxter Hire, MD  latanoprost (XALATAN) 0.005 % ophthalmic solution Place 1  drop into the right eye at bedtime. 07/03/14  Yes [provider]  lisinopril (PRINIVIL,ZESTRIL) 40 MG tablet Take 40 mg by mouth at bedtime.   Yes [provider]  LORazepam (ATIVAN) 0.5 MG tablet Take 1 tablet (0.5 mg total) by mouth at bedtime. Take 1 tablet at bedtime as needed for sleep or anxiety 09/02/16 09/02/17 Yes Baxter Hire, MD  lovastatin (MEVACOR) 40 MG tablet Take 40 mg by mouth at bedtime.   Yes [provider]  metoprolol succinate (TOPROL-XL) 25 MG 24 hr tablet Take 1 tablet (25 mg total) by mouth daily. Take with or immediately following a meal. 09/02/16  Yes Baxter Hire, MD  pantoprazole (PROTONIX) 40 MG tablet Take 40 mg by mouth 2 (two) times daily.   Yes [provider]  pilocarpine (PILOCAR) 4 % ophthalmic solution Place 1 drop into the right eye 4 (four) times daily.    Yes [provider]  tiZANidine (ZANAFLEX) 4 MG tablet Take 1 tablet by mouth 3 (three) times daily as needed. 08/08/14  Yes [provider]  traZODone (DESYREL) 100 MG tablet Take 100 mg by mouth at bedtime.   Yes [provider]  vitamin C (ASCORBIC ACID) 250 MG tablet Take 250 mg by mouth daily.   Yes [provider]  potassium chloride (K-DUR) 10 MEQ tablet Take 1 tablet (10 mEq total) by mouth daily. Take with lasix 03/23/16 08/29/16  Minna Merritts, MD    Review of Systems    As above, overall she has been doing well at [facility] and tolerating rehabilitation. She does have some dyspnea on exertion with fatigue but reports a good appetite without chest pain, palpitations, PND, orthopnea, dizziness, syncope, edema, or early satiety.  All other systems reviewed and are otherwise negative except as noted above.  Physical Exam    VS:  BP (!) 123/46 (BP Location: Left Arm, Patient Position: Sitting, Cuff Size: Normal)   Pulse 83   Ht 5' (1.524 m)   Wt 127 lb 12 oz (57.9 kg)   BMI 24.95 kg/m  , BMI Body mass index is 24.95  kg/m. GEN: Frail, elderly female in no acute distress.  HEENT: normal.  Neck: Supple, no JVD, carotid bruits, or masses. Cardiac: RRR, 3/6 systolic ejection murmur loudest at the right upper sternal border with a 2/6 diastolic murmur, no rubs, or gallops. No clubbing, cyanosis, edema.  Radials/DP/PT 2+ and equal bilaterally.  Respiratory:  Respirations regular and unlabored, clear to auscultation bilaterally. GI: Soft, nontender, nondistended, BS + x 4. MS: no deformity or atrophy. Skin: warm and dry, no rash. Neuro:  Strength and sensation are intact. Psych: Normal affect.  Accessory Clinical Findings    ECG - Regular sinus rhythm, 83, left axis deviation, LVH, lateral T-wave inversion.  Labs from [facility]: Sodium 143, potassium 3.8, chloride 102, CO2 32, BUN 26, creatinine 0.4, glucose 168, AST 17, ALT 11, alkaline phosphatase 35, total bilirubin 0.5, albumin 3.6, hemoglobin 10.4, hematocrit 33, WBC 7.5, platelets 198  Assessment & Plan    1.  Chronic diastolic congestive heart failure: Patient was recently admitted with acute volume overload and responded well to diuretics. Since discharge, she has been doing well and has only required 2 doses of by mouth Lasix since discharge. Her weight is down from hospitalization and she is 127 pounds. Appears discharge weight was 140 pounds. She did have labs drawn on July 11. Her potassium was 3.8 and creatinine was normal at 0.84. Heart rate and blood pressure well controlled today. No changes to medicines at this time. She will continue to use Lasix 20 mg daily as needed for weight gain of 2-3 pounds.    2. Moderate/severe aortic stenosis: Echo in January 2018 suggested severe aortic stenosis while follow-up TEE in February showed moderate aortic stenosis. Referral to TAVR clinic was placed on hold in the setting of poor health. As she is now in rehabilitation and seemingly improving in strength and abilities, her family is interested in a  referral to TAVR clinic to better understand her candidacy. I will go ahead and make this for them.  3. Essential hypertension: Blood pressure is stable on beta blocker, ACE inhibitor, and nitrate therapy.  4. Hyperlipidemia: She remains on statin therapy.  5. PSVT: No recent arrhythmias. Continue beta blocker therapy. Next  6. Disposition: Patient will be referred to TAVR clinic. Follow-up with Dr. Rockey Situ in 2-3 months or sooner if necessary.  Murray Hodgkins, NP 09/14/2016, 12:32 PM

## 2016-09-15 ENCOUNTER — Other Ambulatory Visit
Admission: RE | Admit: 2016-09-15 | Discharge: 2016-09-15 | Disposition: A | Discharge status: Home / Self Care | Payer: No Typology Code available for payment source | Source: Ambulatory Visit | Attending: Family Medicine | Admitting: Family Medicine

## 2016-09-15 DIAGNOSIS — I5033 Acute on chronic diastolic (congestive) heart failure: Secondary | ICD-10-CM | POA: Diagnosis not present

## 2016-09-15 DIAGNOSIS — K5900 Constipation, unspecified: Secondary | ICD-10-CM | POA: Diagnosis not present

## 2016-09-15 DIAGNOSIS — I5043 Acute on chronic combined systolic (congestive) and diastolic (congestive) heart failure: Secondary | ICD-10-CM | POA: Insufficient documentation

## 2016-09-15 DIAGNOSIS — I350 Nonrheumatic aortic (valve) stenosis: Secondary | ICD-10-CM | POA: Diagnosis not present

## 2016-09-15 DIAGNOSIS — M6281 Muscle weakness (generalized): Secondary | ICD-10-CM | POA: Diagnosis not present

## 2016-09-15 DIAGNOSIS — K219 Gastro-esophageal reflux disease without esophagitis: Secondary | ICD-10-CM | POA: Diagnosis not present

## 2016-09-15 DIAGNOSIS — E119 Type 2 diabetes mellitus without complications: Secondary | ICD-10-CM | POA: Diagnosis not present

## 2016-09-15 DIAGNOSIS — J9620 Acute and chronic respiratory failure, unspecified whether with hypoxia or hypercapnia: Secondary | ICD-10-CM | POA: Diagnosis not present

## 2016-09-15 DIAGNOSIS — I10 Essential (primary) hypertension: Secondary | ICD-10-CM | POA: Diagnosis not present

## 2016-09-15 DIAGNOSIS — E785 Hyperlipidemia, unspecified: Secondary | ICD-10-CM | POA: Diagnosis not present

## 2016-09-15 DIAGNOSIS — H409 Unspecified glaucoma: Secondary | ICD-10-CM | POA: Diagnosis not present

## 2016-09-15 DIAGNOSIS — M109 Gout, unspecified: Secondary | ICD-10-CM | POA: Diagnosis not present

## 2016-09-15 LAB — BRAIN NATRIURETIC PEPTIDE: B NATRIURETIC PEPTIDE 5: 313 pg/mL — AB (ref 0.0–100.0)

## 2016-09-21 ENCOUNTER — Ambulatory Visit: Payer: Self-pay | Admitting: Cardiovascular Disease

## 2016-09-26 ENCOUNTER — Ambulatory Visit (INDEPENDENT_AMBULATORY_CARE_PROVIDER_SITE_OTHER): Payer: Medicare HMO | Admitting: Podiatry

## 2016-09-26 VITALS — BP 180/88 | HR 83 | Resp 16

## 2016-09-26 DIAGNOSIS — R519 Headache, unspecified: Secondary | ICD-10-CM | POA: Insufficient documentation

## 2016-09-26 DIAGNOSIS — H409 Unspecified glaucoma: Secondary | ICD-10-CM | POA: Insufficient documentation

## 2016-09-26 DIAGNOSIS — B351 Tinea unguium: Secondary | ICD-10-CM | POA: Diagnosis not present

## 2016-09-26 DIAGNOSIS — E1142 Type 2 diabetes mellitus with diabetic polyneuropathy: Secondary | ICD-10-CM

## 2016-09-26 DIAGNOSIS — R51 Headache: Secondary | ICD-10-CM

## 2016-09-26 DIAGNOSIS — R630 Anorexia: Secondary | ICD-10-CM | POA: Insufficient documentation

## 2016-09-26 DIAGNOSIS — E559 Vitamin D deficiency, unspecified: Secondary | ICD-10-CM | POA: Insufficient documentation

## 2016-09-26 DIAGNOSIS — M25512 Pain in left shoulder: Secondary | ICD-10-CM

## 2016-09-26 DIAGNOSIS — E876 Hypokalemia: Secondary | ICD-10-CM | POA: Insufficient documentation

## 2016-09-26 DIAGNOSIS — I6381 Other cerebral infarction due to occlusion or stenosis of small artery: Secondary | ICD-10-CM | POA: Insufficient documentation

## 2016-09-26 DIAGNOSIS — N390 Urinary tract infection, site not specified: Secondary | ICD-10-CM | POA: Insufficient documentation

## 2016-09-26 DIAGNOSIS — M47817 Spondylosis without myelopathy or radiculopathy, lumbosacral region: Secondary | ICD-10-CM | POA: Insufficient documentation

## 2016-09-26 DIAGNOSIS — K449 Diaphragmatic hernia without obstruction or gangrene: Secondary | ICD-10-CM

## 2016-09-26 DIAGNOSIS — M25511 Pain in right shoulder: Secondary | ICD-10-CM | POA: Insufficient documentation

## 2016-09-26 DIAGNOSIS — R4189 Other symptoms and signs involving cognitive functions and awareness: Secondary | ICD-10-CM | POA: Insufficient documentation

## 2016-09-26 DIAGNOSIS — R918 Other nonspecific abnormal finding of lung field: Secondary | ICD-10-CM | POA: Insufficient documentation

## 2016-09-26 DIAGNOSIS — M79642 Pain in left hand: Secondary | ICD-10-CM | POA: Insufficient documentation

## 2016-09-26 DIAGNOSIS — K219 Gastro-esophageal reflux disease without esophagitis: Secondary | ICD-10-CM | POA: Insufficient documentation

## 2016-09-26 DIAGNOSIS — N814 Uterovaginal prolapse, unspecified: Secondary | ICD-10-CM | POA: Insufficient documentation

## 2016-09-26 DIAGNOSIS — M109 Gout, unspecified: Secondary | ICD-10-CM | POA: Insufficient documentation

## 2016-09-26 DIAGNOSIS — R32 Unspecified urinary incontinence: Secondary | ICD-10-CM | POA: Insufficient documentation

## 2016-09-26 DIAGNOSIS — M79676 Pain in unspecified toe(s): Secondary | ICD-10-CM | POA: Diagnosis not present

## 2016-09-26 DIAGNOSIS — G500 Trigeminal neuralgia: Secondary | ICD-10-CM | POA: Insufficient documentation

## 2016-09-26 DIAGNOSIS — E669 Obesity, unspecified: Secondary | ICD-10-CM | POA: Insufficient documentation

## 2016-09-26 DIAGNOSIS — R438 Other disturbances of smell and taste: Secondary | ICD-10-CM | POA: Insufficient documentation

## 2016-09-26 DIAGNOSIS — M79602 Pain in left arm: Secondary | ICD-10-CM | POA: Insufficient documentation

## 2016-09-26 NOTE — Progress Notes (Signed)
Subjective:     Patient ID: [name], female   DOB: 31-May-1924, 83 y.o.   MRN: [id number]  HPI this patient presents the office with chief complaint of long thick nails.  Patient states the nails are painful walking and wearing her shoes.  This patient is unable to self treat.  This patient is diabetic, type II.  She presents the office today for an evaluation and treatment of her long thick painful nails.   Review of Systems     Objective:   Physical Exam GENERAL APPEARANCE: Alert, conversant. Appropriately groomed. No acute distress.  VASCULAR: Pedal pulses are  palpable at  DP  bilateral.  PT are not palpable feet  B/L Capillary refill time is immediate to all digits,  Cold feet NEUROLOGIC: sensation is normal to 5.07 monofilament at 5/5 sites bilateral.  Light touch is intact bilateral, Muscle strength normal.  MUSCULOSKELETAL: acceptable muscle strength, tone and stability bilateral.  HAV  B/L. NAILS  Thick disfigured discolored nails with subungual debris noted.  No evidence of bacterial infection or drainage DERMATOLOGIC: skin color, texture, and turgor are within normal limits.  No preulcerative lesions or ulcers  are seen, no interdigital maceration noted.  No open lesions present.  Digital nails are asymptomatic. No drainage noted.     Assessment:     Onychomycosis  B/L    Plan:    IE  Debridement of nails  B/L.     Gardiner Barefoot DPM

## 2016-09-30 ENCOUNTER — Encounter: Payer: Self-pay | Admitting: Cardiovascular Disease

## 2016-09-30 ENCOUNTER — Other Ambulatory Visit: Payer: Self-pay | Admitting: *Deleted

## 2016-09-30 ENCOUNTER — Ambulatory Visit (INDEPENDENT_AMBULATORY_CARE_PROVIDER_SITE_OTHER): Payer: Medicare HMO | Admitting: Cardiovascular Disease

## 2016-09-30 VITALS — BP 138/40 | HR 76 | Ht 60.0 in | Wt 130.8 lb

## 2016-09-30 DIAGNOSIS — I350 Nonrheumatic aortic (valve) stenosis: Secondary | ICD-10-CM | POA: Diagnosis not present

## 2016-09-30 NOTE — Progress Notes (Signed)
Cardiology Office Note Date:  09/30/2016   ID:  [name], [name] 1924/04/15, MRN [id number]  PCP:  Tegeler, Loma Sousa, MD  Cardiologist:  [name], MD    Chief Complaint  Patient presents with  . New Patient (Initial Visit)     History of Present Illness: [name] [name] is a 83 y.o. female who presents for evaluation of severe symptomatic aortic stenosis.  The patient has been followed in [facility] by Dr. Rockey Situ and Ignacia Bayley, NP. She's had multiple hospital evaluations for failure to thrive, malaise, anorexia, chronic pain, heart failure. She was most recently hospitalized in July with acute on chronic diastolic heart failure.  She is a mother of 11 children and over 76 grandchildren. She's been married twice and has been widowed since 2005. The patient is here with 2 of her daughters today. She was just discharged from a rehab facility last week. Her daughters state that her strength is improving. She has been able to walk in the whole without a walker or cane. Today she is in a wheelchair but the family states that is because we worked them in and they had to rush over here.   The patient is short of breath with activity. She denies orthopnea or PND. She denies chest pain, lightheadedness, or syncope. In review of all of her studies, she had an old stroke seen on CT scan, but the patient has not had a clinical stroke. She is currently living alone but one of her children are with her all of the time. She is able to get up on her own per their report and her activity level is increasing slowly.    Past Medical History:  Diagnosis Date  . Anemia   . Bone disease   . Carotid arterial disease ([place])    a. 08/2014 U/S: Bilateral < 50% stenosis. Patent vertebrals w/ antegrade flow.   . Cervical spine arthritis ([place])   . Chronic diastolic CHF (congestive heart failure) ([place])    a. echo 2014: EF 55-60%, DD;  b. 08/2014 Echo: EF 55-60%, no RWMA, GR1DD; c. 02/2016  Echo: EF 65-70%, Gr1 DD.  Marland Kitchen Cognitive impairment   . Depression   . Diabetes mellitus without complication ([place])   . Essential hypertension   . GERD (gastroesophageal reflux disease)   . Glaucoma   . Gout   . Hiatal hernia   . History of renal impairment   . Hyperlipidemia   . Hypotension    a. Related to Norvasc - 11/2014 ED visit.  . Multinodular goiter    a. Noted incidentally on CT 07/2012 and carotid U/S 08/2014;  b. Nl TSH 11/2014.  Marland Kitchen Paroxysmal SVT (supraventricular tachycardia) ([facility])    a. on Toprol   . Prolapsed uterus   . Severe aortic stenosis    a. echo 08/2014: EF 55-60%, no RWMA, GR1DD, severe AS, mild AI, Mean gradient (S): 38 mm Hg. Valve area (VTI): 0.78 cm^2., mild-mod MR, LA mildly dilated, atrial septal aneurysm, mild TR;  b. 02/2016 Echo: EF 65-70%, Gr1 DD, sev AS, mild to mod AI, Valve area (VTI): 0.94 cm^2, (Vmax) 0.96 cm^2, (Vmean) 0.93cm^2, mild MR, mildly dil LA, nl RV fxn, PASP 19mmHg; c. 03/2016 TEE: EF 55-60, mod AS.  Marland Kitchen Stroke [facility])    a. CT head 07/2014 showed small thalamic infarct  . Vitamin deficiency     Past Surgical History:  Procedure Laterality Date  . TEE WITHOUT CARDIOVERSION N/A 03/31/2016   Procedure: TRANSESOPHAGEAL ECHOCARDIOGRAM (  TEE);  Surgeon: Minna Merritts, MD;  Location: [facility] ORS;  Service: Cardiovascular;  Laterality: N/A;  . TUBAL LIGATION      Current Outpatient Prescriptions  Medication Sig Dispense Refill  . acetaminophen (TYLENOL) 500 MG tablet Take 500 mg by mouth every 6 (six) hours as needed for mild pain or headache.    . Amino Acids-Protein Hydrolys (FEEDING SUPPLEMENT, PRO-STAT SUGAR FREE 64,) LIQD Take 30 mLs by mouth 3 (three) times daily with meals.    Marland Kitchen aspirin 81 MG tablet Take 81 mg by mouth daily.    . brimonidine (ALPHAGAN P) 0.1 % SOLN Place 1 drop into the right eye 2 (two) times daily.    . Cholecalciferol (VITAMIN D) 2000 units CAPS Take 2,000 Units by mouth daily.     . Dorzolamide HCl-Timolol Mal (COSOPT OP)  Place 1 drop into the right eye 2 (two) times daily.     . feeding supplement, ENSURE ENLIVE, (ENSURE ENLIVE) LIQD Take 237 mLs by mouth 2 (two) times daily between meals. 237 mL 12  . furosemide (LASIX) 20 MG tablet Take 20 mg by mouth daily as needed (with potassium).    Marland Kitchen gabapentin (NEURONTIN) 300 MG capsule Take 300 mg by mouth 2 (two) times daily.    . hydrALAZINE (APRESOLINE) 50 MG tablet Take 50 mg by mouth 3 (three) times daily as needed (elevated BP).    Marland Kitchen HYDROcodone-acetaminophen (NORCO/VICODIN) 5-325 MG tablet Take 1 tablet by mouth every 6 (six) hours as needed for moderate pain. 30 tablet 0  . iron polysaccharides (NIFEREX) 150 MG capsule Take 150 mg by mouth 2 (two) times daily.    . isosorbide mononitrate (IMDUR) 30 MG 24 hr tablet Take 1 tablet (30 mg total) by mouth daily. 30 tablet 0  . latanoprost (XALATAN) 0.005 % ophthalmic solution Place 1 drop into the right eye at bedtime.  3  . lisinopril (PRINIVIL,ZESTRIL) 10 MG tablet Take 10 mg by mouth daily.    Marland Kitchen LORazepam (ATIVAN) 0.5 MG tablet Take 1 tablet (0.5 mg total) by mouth at bedtime. Take 1 tablet at bedtime as needed for sleep or anxiety 60 tablet 0  . lovastatin (MEVACOR) 40 MG tablet Take 40 mg by mouth at bedtime.    . metoprolol succinate (TOPROL-XL) 25 MG 24 hr tablet Take 25 mg by mouth daily.    . pantoprazole (PROTONIX) 40 MG tablet Take 40 mg by mouth daily.    . pilocarpine (PILOCAR) 4 % ophthalmic solution Place 1 drop into the right eye 4 (four) times daily.     Marland Kitchen tiZANidine (ZANAFLEX) 4 MG tablet Take 1 tablet by mouth 3 (three) times daily as needed for muscle spasms.   1  . traZODone (DESYREL) 100 MG tablet Take 100 mg by mouth at bedtime.    . vitamin C (ASCORBIC ACID) 250 MG tablet Take 250 mg by mouth daily.    . potassium chloride (K-DUR) 10 MEQ tablet Take 1 tablet (10 mEq total) by mouth daily. Take with lasix 30 tablet 6   No current facility-administered medications for this visit.      Allergies:   Metformin and related   Social History:  The patient  reports that she has never smoked. She has never used smokeless tobacco. She reports that she does not drink alcohol or use drugs.   Family History:  The patient's family history includes Colitis in her father; Diabetes in her mother; Glaucoma in her mother; Peptic Ulcer in her father.  ROS:  Please see the history of present illness.  All other systems are reviewed and negative.    PHYSICAL EXAM: VS:  BP (!) 138/40   Pulse 76   Ht 5' (1.524 m)   Wt 130 lb 12.8 oz (59.3 kg)   BMI 25.55 kg/m  , BMI Body mass index is 25.55 kg/m. GEN: Elderly frail-appearing woman in no acute distress  HEENT: normal  Neck: no JVD, no masses. No carotid bruits Cardiac: RRR with 3/6 harsh late peaking systolic murmur best heard at the right upper sternal border and grade 2/6 diastolic murmur at the left lower sternal border              Respiratory:  clear to auscultation bilaterally, normal work of breathing GI: soft, nontender, nondistended, + BS MS: no deformity or atrophy  Ext: no pretibial edema Skin: warm and dry, no rash Neuro:  Strength and sensation are intact Psych: euthymic mood, full affect  EKG:  EKG is not ordered today.  Recent Labs: 08/29/2016: Hemoglobin 10.3; Magnesium 1.3; Platelets 193 08/30/2016: ALT 20; TSH 0.689 09/02/2016: BUN 18; Creatinine, Ser 0.67; Potassium 3.8; Sodium 142 09/15/2016: B Natriuretic Peptide 313.0   Lipid Panel     Component Value Date/Time   CHOL 114 09/11/2014 0246   TRIG 97 09/11/2014 0246   HDL 40 (L) 09/11/2014 0246   CHOLHDL 2.9 09/11/2014 0246   VLDL 19 09/11/2014 0246   LDLCALC 55 09/11/2014 0246      Wt Readings from Last 3 Encounters:  09/30/16 130 lb 12.8 oz (59.3 kg)  09/14/16 127 lb 12 oz (57.9 kg)  09/02/16 140 lb 4 oz (63.6 kg)     Cardiac Studies Reviewed: 2D Echo 03/23/2016: Left ventricle:  The cavity size was at the upper limits of normal. Wall  thickness was increased in a pattern of moderate LVH. Systolic function was vigorous. The estimated ejection fraction was in the range of 65% to 70%. Wall motion was normal; there were no regional wall motion abnormalities. Doppler parameters are consistent with abnormal left ventricular relaxation (grade 1 diastolic dysfunction). Doppler parameters are consistent with high ventricular filling pressure. Global longitudinal strain is -21.8%.   ------------------------------------------------------------------- Aortic valve:   Trileaflet; severely thickened, moderately calcified leaflets. Cusp separation was reduced.  Doppler: Transvalvular velocity was increased. There was severe stenosis. There was mild to moderate regurgitation.    VTI ratio of LVOT to aortic valve: 0.33. Valve area (VTI): 0.94 cm^2. Indexed valve area (VTI): 0.56 cm^2/m^2. Peak velocity ratio of LVOT to aortic valve: 0.34. Valve area (Vmax): 0.96 cm^2. Indexed valve area (Vmax): 0.57 cm^2/m^2. Mean velocity ratio of LVOT to aortic valve: 0.33. Valve area (Vmean): 0.93 cm^2. Indexed valve area (Vmean): 0.55 cm^2/m^2.    Mean gradient (S): 60 mm Hg. Peak gradient (S): 97 mm Hg.  ------------------------------------------------------------------- Aorta:  Aortic root: The aortic root was normal in size. Ascending aorta: The ascending aorta was normal in size.  ------------------------------------------------------------------- Mitral valve:   Calcified annulus. Mildly thickened leaflets . Leaflet separation was normal.  Doppler:  Transvalvular velocity was within the normal range. There was no evidence for stenosis. There was mild regurgitation.    Peak gradient (D): 8 mm Hg.  ------------------------------------------------------------------- Left atrium:  The atrium was mildly dilated.  ------------------------------------------------------------------- Atrial septum:  The interatrial septum was hypermobile.  There was an atrial septal aneurysm.  ------------------------------------------------------------------- Right ventricle:  The cavity size was normal. Systolic function was normal.  ------------------------------------------------------------------- Pulmonic valve:    Structurally  normal valve.   Cusp separation was normal.  Doppler:  Transvalvular velocity was within the normal range. There was no regurgitation.  ------------------------------------------------------------------- Tricuspid valve:   Structurally normal valve.   Leaflet separation was normal.  Doppler:  Transvalvular velocity was within the normal range. There was mild regurgitation.  ------------------------------------------------------------------- Pulmonary artery:   Systolic pressure was severely increased.  ------------------------------------------------------------------- Right atrium:  The atrium was normal in size.  ------------------------------------------------------------------- Pericardium:  A trivial pericardial effusion was identified.  ------------------------------------------------------------------- Systemic veins: Inferior vena cava: The vessel was normal in size. The respirophasic diameter changes were in the normal range (>= 50%), consistent with normal central venous pressure.  ------------------------------------------------------------------- Measurements   Left ventricle                           Value          Reference  LV ID, ED, PLAX chordal                  45.5  mm       43 - 52  LV ID, ES, PLAX chordal                  25.2  mm       23 - 38  LV fx shortening, PLAX chordal           45    %        >=29  LV PW thickness, ED                      13.4  mm       ----------  IVS/LV PW ratio, ED                      1.19           <=1.3  Stroke volume, 2D                        101   ml       ----------  Stroke volume/bsa, 2D                    60    ml/m^2    ----------  LV ejection fraction, 1-p A4C            80    %        ----------  LV end-diastolic volume, 2-p             51    ml       ----------  LV end-systolic volume, 2-p              9     ml       ----------  LV ejection fraction, 2-p                82    %        ----------  Stroke volume, 2-p                       42    ml       ----------  LV end-diastolic volume/bsa, 2-p         30    ml/m^2   ----------  LV end-systolic volume/bsa, 2-p          5  ml/m^2   ----------  Stroke volume/bsa, 2-p                   24.7  ml/m^2   ----------  LV e&', lateral                           6.85  cm/s     ----------  LV E/e&', lateral                         20.15          ----------  LV e&', medial                            3.81  cm/s     ----------  LV E/e&', medial                          36.22          ----------  LV e&', average                           5.33  cm/s     ----------  LV E/e&', average                         25.89          ----------  Longitudinal strain, TDI                 22    %        ----------    Ventricular septum                       Value          Reference  IVS thickness, ED                        15.9  mm       ----------    LVOT                                     Value          Reference  LVOT ID, S                               19    mm       ----------  LVOT area                                2.84  cm^2     ----------  LVOT peak velocity, S                    167   cm/s     ----------  LVOT mean velocity, S                    121   cm/s     ----------  LVOT VTI, S  35.4  cm       ----------  LVOT peak gradient, S                    11    mm Hg    ----------    Aortic valve                             Value          Reference  Aortic valve peak velocity, S            493   cm/s     ----------  Aortic valve mean velocity, S            370   cm/s     ----------  Aortic valve VTI, S                      107   cm        ----------  Aortic mean gradient, S                  60    mm Hg    ----------  Aortic peak gradient, S                  97    mm Hg    ----------  VTI ratio, LVOT/AV                       0.33           ----------  Aortic valve area, VTI                   0.94  cm^2     ----------  Aortic valve area/bsa, VTI               0.56  cm^2/m^2 ----------  Velocity ratio, peak, LVOT/AV            0.34           ----------  Aortic valve area, peak velocity         0.96  cm^2     ----------  Aortic valve area/bsa, peak              0.57  cm^2/m^2 ----------  velocity  Velocity ratio, mean, LVOT/AV            0.33           ----------  Aortic valve area, mean velocity         0.93  cm^2     ----------  Aortic valve area/bsa, mean              0.55  cm^2/m^2 ----------  velocity  Aortic regurg pressure half-time         267   ms       ----------    Aorta                                    Value          Reference  Aortic root ID, ED                       30    mm       ----------  Ascending aorta ID, A-P,  S               34    mm       ----------    Left atrium                              Value          Reference  LA ID, A-P, ES                           39    mm       ----------  LA ID/bsa, A-P                    (H)    2.3   cm/m^2   <=2.2  LA volume, S                             58.2  ml       ----------  LA volume/bsa, S                         34.4  ml/m^2   ----------  LA volume, ES, 1-p A4C                   61.1  ml       ----------  LA volume/bsa, ES, 1-p A4C               36.1  ml/m^2   ----------  LA volume, ES, 1-p A2C                   50.9  ml       ----------  LA volume/bsa, ES, 1-p A2C               30.1  ml/m^2   ----------    Mitral valve                             Value          Reference  Mitral E-wave peak velocity              138   cm/s     ----------  Mitral A-wave peak velocity              197   cm/s     ----------  Mitral deceleration time          (L)    116   ms        150 - 230  Mitral peak gradient, D                  8     mm Hg    ----------  Mitral E/A ratio, peak                   0.7            ----------    Pulmonary arteries                       Value          Reference  PA pressure, S, DP                (  H)    75    mm Hg    <=30    Tricuspid valve                          Value          Reference  Tricuspid peak RV-RA gradient            53    mm Hg    ----------    Right atrium                             Value          Reference  RA ID, S-I, ES, A4C               (H)    53.9  mm       34 - 49  RA area, ES, A4C                         13.9  cm^2     8.3 - 19.5  RA volume, ES, A/L                       29.3  ml       ----------  RA volume/bsa, ES, A/L                   17.3  ml/m^2   ----------    Right ventricle                          Value          Reference  TAPSE                                    34.4  mm       ----------  RV s&', lateral, S                        11.7  cm/s     ----------    Pulmonic valve                           Value          Reference  Pulmonic valve peak velocity, S          143   cm/s     ----------  Pulmonic regurg velocity, ED             155   cm/s     ----------  Pulmonic regurg gradient, ED             10    mm Hg    ----------  Legend: (L)  and  (H)  mark values outside specified reference range.  STS Risk Calculator: Risk of Mortality: 6.689%  Morbidity or Mortality: 33.11%  Long Length of Stay: 21.961%  Short Length of Stay: 9.334%  Permanent Stroke: 6.015%  Prolonged Ventilation: 25.303%  DSW Infection: 0.117%  Renal Failure: 12.166%  Reoperation: 12.184%   ASSESSMENT AND PLAN: This is an 83 year old elderly woman with severe, stage DI aortic stenosis complicated by recurrent episodes of acute on chronic diastolic congestive heart failure.  I have reviewed  the natural history of aortic stenosis with the patient and her daughters are present today. We have discussed the limitations  of medical therapy and the poor prognosis associated with symptomatic aortic stenosis, especially when complicated with congestive heart failure. We have reviewed potential treatment options, including palliative medical therapy, conventional surgical aortic valve replacement, and transcatheter aortic valve replacement. We discussed treatment options in the context of this patient's specific comorbid medical conditions.   The patient's echo images are personally reviewed. There is severe concentric hypertrophy of the left ventricle. The LV systolic function is vigorous. The patient has severely calcified aortic valve leaflets with peak and mean gradients of 97 and 61 mmHg, respectively. There is mild to moderate aortic insufficiency present.  This patient is clearly not a candidate for conventional cardiac surgery considering her advanced age and severe functional limitation. While on paper the patient doesn't have any absolute contraindication to TAVR, I am pretty concerned about her degree of frailty. I had a frank discussion with the patient's family today that I feel she is pretty marginal to undergo the workup and TAVR surgery. They feel she is getting stronger and would like to do anything possible to help her longevity and quality of life. Rather then exposing her to the risk of cardiac catheterization and other pre-TAVR workup, I think it is best to have her undergo a formal physical therapy assessment and then cardiac surgical evaluation for a second opinion regarding her candidacy for TAVR. If she is not a candidate I would not want to expose her to the risks of CT scans and heart catheterization.   Current medicines are reviewed with the patient today.  The patient does not have concerns regarding medicines.  Labs/ tests ordered today include:  No orders of the defined types were placed in this encounter.  Disposition:   FU pending further evaluation  Signed, [name], MD  09/30/2016  5:24 PM    [facility] [address], [place], [place]  [address] Phone: [phone]; Fax: [phone]

## 2016-09-30 NOTE — Patient Instructions (Addendum)
Medication Instructions:  Your physician recommends that you continue on your current medications as directed. Please refer to the Current Medication list given to you today.  Labwork: No new orders.   Testing/Procedures: Your physician has requested Outpatient Physical Therapy evaluation.  This will be done at [facility] [place], [place] [address]  October 11, 2016 at 2:30 PM  Follow-Up: You have been referred to Dr Roxy Manns and Dr Cyndia Bent at [facility] for further TAVR evaluation.  [address], [address], [place], [place] [address] Dr Cyndia Bent, October 11, 2016 at 4:00 PM   Any Other Special Instructions Will Be Listed Below (If Applicable).     If you need a refill on your cardiac medications before your next appointment, please call your pharmacy.

## 2016-10-05 ENCOUNTER — Telehealth: Payer: Self-pay | Admitting: Cardiovascular Disease

## 2016-10-05 NOTE — Telephone Encounter (Signed)
Left voicemail message to call back  

## 2016-10-05 NOTE — Telephone Encounter (Signed)
[name] with [facility] calling to give [place] BP readings  10/05/16 126/42   He states the diastolic readings have been low  Just wanted to let us know  Patient is very sluggish and would like some advise on this Please call back

## 2016-10-05 NOTE — Telephone Encounter (Signed)
Patient has been having dizziness and fatigue and her blood pressures are normal then after her morning medications her diastolic runs in the 66-59'D range. He reports her systolic numbers have been normal 120-140's range. Cassandra Sleight RN with home health is concerned about her low diastolic numbers and he did not have any readings other than the one from today 126/42. Instructed him to please have her keep a log of readings between now and Friday and to give [place] a call back with those and then I can forward to Dr. Rockey Situ for his review. He was agreeable with this plan and had no further questions at this time.

## 2016-10-10 ENCOUNTER — Telehealth: Payer: Self-pay | Admitting: Cardiovascular Disease

## 2016-10-10 NOTE — Telephone Encounter (Signed)
No way around low diastolic pressures She has significant aortic valve stenosis, causing low diastolic pressures not a candidate for surgery when seen in the hospital

## 2016-10-10 NOTE — Telephone Encounter (Addendum)
Update on BP   Yesterday 10/09/16  before med 170/45 after 150/42  Today 10/10/16  Before meds 151/48 after 146/44 .  "Patient weight is ok" .  Also reports patient is nodding off usual fatigue   HH nurse is still concerned about DBP. Looks like Pam discussed on Friday -will make Dr. Rockey Situ aware and call back w/ his recommendation.

## 2016-10-10 NOTE — Telephone Encounter (Signed)
See previous note  Update on BP   Yesterday 10/09/16  before med 170/45 after 150/42   Today 10/10/16  Before meds 151/48 after 146/44 .   Patient weight is ok .  Also reports patient is nodding off usual fatigue

## 2016-10-11 ENCOUNTER — Encounter: Payer: Self-pay | Admitting: Surgery

## 2016-10-11 ENCOUNTER — Encounter: Payer: Self-pay | Admitting: Physical Therapy

## 2016-10-11 ENCOUNTER — Ambulatory Visit: Payer: Medicare HMO | Attending: Cardiovascular Disease | Admitting: Physical Therapy

## 2016-10-11 DIAGNOSIS — R293 Abnormal posture: Secondary | ICD-10-CM

## 2016-10-11 DIAGNOSIS — M6281 Muscle weakness (generalized): Secondary | ICD-10-CM | POA: Diagnosis present

## 2016-10-11 DIAGNOSIS — R2689 Other abnormalities of gait and mobility: Secondary | ICD-10-CM

## 2016-10-11 NOTE — Telephone Encounter (Signed)
Reviewed with home health nurse Dr. Donivan Scull recommendations and he verbalized understanding with no further questions at this time.

## 2016-10-11 NOTE — Therapy (Signed)
[place], [place], [address] Phone: [phone]   Fax:  [phone]  Physical Therapy Evaluation  Patient Details  Name: [name] MRN: [id number] Date of Birth: 08/27/1924 Referring Provider: Dr. [name]  Encounter Date: 10/11/2016      PT End of Session - 10/11/16 1414    Visit Number 1   PT Start Time 1410   PT Stop Time 1509   PT Time Calculation (min) 59 min   Equipment Utilized During Treatment Gait belt      Past Medical History:  Diagnosis Date  . Anemia   . Bone disease   . Carotid arterial disease ([place])    a. 08/2014 U/S: Bilateral < 50% stenosis. Patent vertebrals w/ antegrade flow.   . Cervical spine arthritis ([place])   . Chronic diastolic CHF (congestive heart failure) ([place])    a. echo 2014: EF 55-60%, DD;  b. 08/2014 Echo: EF 55-60%, no RWMA, GR1DD; c. 02/2016 Echo: EF 65-70%, Gr1 DD.  Marland Kitchen Cognitive impairment   . Depression   . Diabetes mellitus without complication ([place])   . Essential hypertension   . GERD (gastroesophageal reflux disease)   . Glaucoma   . Gout   . Hiatal hernia   . History of renal impairment   . Hyperlipidemia   . Hypotension    a. Related to Norvasc - 11/2014 ED visit.  . Multinodular goiter    a. Noted incidentally on CT 07/2012 and carotid U/S 08/2014;  b. Nl TSH 11/2014.  Marland Kitchen Paroxysmal SVT (supraventricular tachycardia) ([facility])    a. on Toprol   . Prolapsed uterus   . Severe aortic stenosis    a. echo 08/2014: EF 55-60%, no RWMA, GR1DD, severe AS, mild AI, Mean gradient (S): 38 mm Hg. Valve area (VTI): 0.78 cm^2., mild-mod MR, LA mildly dilated, atrial septal aneurysm, mild TR;  b. 02/2016 Echo: EF 65-70%, Gr1 DD, sev AS, mild to mod AI, Valve area (VTI): 0.94 cm^2, (Vmax) 0.96 cm^2, (Vmean) 0.93cm^2, mild MR, mildly dil LA, nl RV fxn, PASP 19mmHg; c. 03/2016 TEE: EF 55-60, mod AS.  Marland Kitchen Stroke [facility])    a. CT head 07/2014 showed small thalamic infarct   . Vitamin deficiency     Past Surgical History:  Procedure Laterality Date  . TEE WITHOUT CARDIOVERSION N/A 03/31/2016   Procedure: TRANSESOPHAGEAL ECHOCARDIOGRAM (TEE);  Surgeon: Minna Merritts, MD;  Location: [facility] ORS;  Service: Cardiovascular;  Laterality: N/A;  . TUBAL LIGATION      There were no vitals filed for this visit.       Subjective Assessment - 10/11/16 1417    Subjective Pt/daughter report patient has not returned back to normal since her January hospitalization. Prior to that patient was working in the yard, travelling to [employer], shopping, Social research officer, government. Pt is now limited to household activities and going out for MD appointments.    Patient Stated Goals hopes to return to yardwork and PLOF   Currently in Pain? No/denies            [facility] PT Assessment - 10/11/16 0001      Assessment   Medical Diagnosis severe aortic stenosis   Referring Provider Dr. [name]   Onset Date/Surgical Date --  Jan 2018     Precautions   Precautions None     Restrictions   Weight Bearing Restrictions No     Balance Screen   Has the patient fallen in the past 6  months No   Has the patient had a decrease in activity level because of a fear of falling?  No   Is the patient reluctant to leave their home because of a fear of falling?  No     Home Ecologist residence   Living Arrangements Alone  daughters provide 24/7 care   [place] to enter   Entrance Stairs-Number of Steps 3   Entrance Stairs-Rails Left   [place] One level     Posture/Postural Control   Posture/Postural Control Postural limitations   Postural Limitations Forward head;Rounded Shoulders     ROM / Strength   AROM / PROM / Strength AROM;Strength     AROM   Overall AROM Comments shoulder flexion limited by 25-50%, otherwise [facility]     Strength   Overall Strength Comments grossly 4+/5 on R side and 4-/5 on left side   Strength Assessment Site Hand    Right/Left hand Right;Left   Right Hand Grip (lbs) 18   Left Hand Grip (lbs) unable  L hand dominant     Ambulation/Gait   Gait Comments Pt ambulates without a device with CGA, decreased step length bil and severe forward head posture/rounded shoulders. Pt's gait distance was limited by 72% for age/gender.           [facility] Pre-Surgical Assessment - 10/11/16 0001    5 Meter Walk Test- trial 1 10 sec   5 Meter Walk Test- trial 2 11 sec.    5 Meter Walk Test- trial 3 8 sec.   5 meter walk test average 9.67 sec   4 Stage Balance Test tolerated for:  10 sec.   4 Stage Balance Test Position 2   Comment needs UE support   ADL/IADL Needs Assistance with: Bathing;Dressing;Meal prep;Finances;Yard work   ADL/IADL Therapist, sports Index Midly frail   6 Minute Walk- Baseline yes   BP (mmHg) 178/46   HR (bpm) 79   02 Sat (%RA) 99 %   Modified Borg Scale for Dyspnea 0- Nothing at all   Perceived Rate of Exertion (Borg) 6-   6 Minute Walk Post Test yes   BP (mmHg) 196/52  170/46 after 5 minutes   HR (bpm) 114   02 Sat (%RA) 96 %   Modified Borg Scale for Dyspnea 4- somewhat severe   Perceived Rate of Exertion (Borg) 11- Fairly light   Aerobic Endurance Distance Walked 360          Objective measurements completed on examination: See above findings.                  PT Education - 10/11/16 1533    Education provided Yes   Education Details fall risk, considered trialing SPC with HH PT   Person(s) Educated Patient;Child(ren)   Methods Explanation   Comprehension Verbalized understanding                     Plan - 10/11/16 1533    Clinical Impression Statement see below   PT Frequency One time visit     Clinical Impression Statement: Pt is a motivated 83 yo female presenting to OP PT for evaluation prior to possible TAVR surgery due to severe aortic stenosis. Pt reports onset of a general decline in function after her hospitalization in Jan 2018. She was  hospitalized again in July for 4 days due to heart failure, underwent therapy at a SNF and is now receiving home health  services. She continues to get stronger but is limited by shortness of breath and fatigue. She has a great support system. Patient lives alone but daughters provided 24/7 care. She has an aide that bathes her. Her daughters assist with meals, finances, etc. Her L hand is swollen and hypersensitive and this causes her to need a lot of assistance with household activities. Pt ambulates independently in her house and with supervision min A when leaving the house. Pt presents with fair ROM and strength - her L side is slightly weaker than her R throughout and has decreased coordination, poor balance and is at high fall risk 4 stage balance test, slow walking speed and poor aerobic endurance per 6 minute walk test. Pt ambulated 185 feet in 2:45 before requesting a seated rest beak lasting 1:00. At time of rest, patient's HR was 107 bpm and O2 was 98 on room air. Pt able to resume after rest and ambulate an additional 175 feet. Pt ambulated a total of 360 feet in 6 minute walk. BP increased from 178/46 at rest to 196/52 6 minute walk test. It recovered to 170/46 after 5 minutes. Based on the Short Physical Performance Battery, patient has a frailty rating of 3/12 with </= 5/12 considered frail. I anticipate with continued HHPT, pt will continue to improve her functional mobility.   Patient demonstrated the following deficits and impairments:     Visit Diagnosis: Other abnormalities of gait and mobility  Abnormal posture  Muscle weakness (generalized)      G-Codes - October 29, 2016 1534    Functional Assessment Tool Used (Outpatient Only) 6 minute walk 360'   Functional Limitation Mobility: Walking and moving around   Mobility: Walking and Moving Around Current Status [phone]) At least 60 percent but less than 80 percent impaired, limited or restricted   Mobility: Walking and Moving Around Goal  Status [phone]) At least 60 percent but less than 80 percent impaired, limited or restricted   Mobility: Walking and Moving Around Discharge Status [phone]) At least 60 percent but less than 80 percent impaired, limited or restricted       Problem List Patient Active Problem List   Diagnosis Date Noted  . Bitter taste 09/26/2016  . Cognitive impairment 09/26/2016  . Glaucoma 09/26/2016  . Gout 09/26/2016  . Hand pain, left 09/26/2016  . Headache 09/26/2016  . Hiatal hernia with gastroesophageal reflux 09/26/2016  . Hypokalemia 09/26/2016  . Left arm pain 09/26/2016  . Lumbar and sacral spondylarthritis 09/26/2016  . Multiple lacunar infarcts ([place]) 09/26/2016  . Multiple pulmonary nodules 09/26/2016  . No appetite 09/26/2016  . Obesity 09/26/2016  . Recurrent UTI 09/26/2016  . Shoulder pain, bilateral 09/26/2016  . Trigeminal neuralgia 09/26/2016  . Urinary leakage 09/26/2016  . Uterine prolapse 09/26/2016  . Vitamin D deficiency 09/26/2016  . Malnutrition ([place]) 08/30/2016  . Anemia due to unknown mechanism 08/30/2016  . Acute respiratory distress 08/30/2016  . Acute on chronic diastolic CHF (congestive heart failure) ([place]) 08/29/2016  . Aortic stenosis 05/03/2016  . Enterococcal bacteremia 03/30/2016  . Viral upper respiratory tract infection   . Acute back pain   . Sepsis ([place])   . Elevated troponin   . CAP (community acquired pneumonia) 03/28/2016  . Fatigue 12/14/2015  . Frequency of urination 12/14/2015  . Osteoporosis 10/29/2015  . Leg swelling 09/14/2015  . Bilateral lower extremity edema 07/07/2015  . Hypertension   . Hyperlipidemia   . Diabetes mellitus without complication ([place])   . PSVT (paroxysmal supraventricular  tachycardia) ([place])   . Multiple thyroid nodules   . Carotid arterial disease ([place])   . Hypotension   . Sleepiness 12/25/2014  . Hospital discharge follow-up 09/24/2014  . NSTEMI (non-ST elevated myocardial infarction) ([place]) 09/10/2014  .  Carotid atherosclerosis, bilateral 09/10/2014  . Renal insufficiency 08/28/2014  . Bladder prolapse, female, acquired 08/28/2014  . Confusion 08/28/2014  . Hydronephrosis, bilateral 08/28/2014  . Medicare annual wellness visit, subsequent 07/23/2014  . Weakness 07/19/2014  . Diarrhea 06/16/2014  . Diabetic polyneuropathy associated with type 2 diabetes mellitus ([place]) 03/10/2014  . Hammer toe of left foot 03/10/2014  . Hammer toe of right foot 03/10/2014  . Metatarsalgia of left foot 03/10/2014  . Metatarsalgia of right foot 03/10/2014  . Onychogryphosis 03/10/2014  . Orofacial dyskinesia 02/28/2013  . SVT (supraventricular tachycardia) ([place]) 12/17/2012  . Diabetes type 2, controlled ([place]) 12/17/2012  . SOB (shortness of breath) 12/17/2012  . Essential hypertension, malignant 12/17/2012  . Chronic diastolic CHF (congestive heart failure) ([place]) 12/17/2012  . Hypomagnesemia 09/22/2012  . Weight loss 09/12/2012  . Cervical spine arthritis ([place]) 06/29/2010    [place], PT 10/11/2016, 3:35 PM  [facility] [address] [place], [place], [address] Phone: [phone]   Fax:  [phone]  Name: Alzina Golda MRN: [id number] Date of Birth: February 10, 1925

## 2016-10-13 ENCOUNTER — Encounter: Payer: Self-pay | Admitting: Surgery

## 2016-10-13 ENCOUNTER — Institutional Professional Consult (permissible substitution) (INDEPENDENT_AMBULATORY_CARE_PROVIDER_SITE_OTHER): Admitting: Surgery

## 2016-10-13 VITALS — BP 170/60 | HR 87 | Resp 20 | Ht 60.0 in | Wt 126.0 lb

## 2016-10-13 DIAGNOSIS — I350 Nonrheumatic aortic (valve) stenosis: Secondary | ICD-10-CM

## 2016-10-14 ENCOUNTER — Encounter: Payer: Self-pay | Admitting: Surgery

## 2016-10-14 NOTE — Progress Notes (Signed)
[facility]  CARDIOTHORACIC SURGERY CONSULTATION REPORT  Referring Provider is Gollan, Kathlene November, MD PCP is Tegeler, Loma Sousa, MD  Chief Complaint  Patient presents with  . Aortic Stenosis    Surgical eval for possible TAVR, ECHO 03/31/16, PT Eval 10/11/16    HPI:  The patient is an 83 year old woman with hypertension, DM, hyperlipidemia and known aortic stenosis that has been followed by Dr. Rockey Situ. Her echo in 08/2014 showed severe AS with a mean gradient of 38 mm Hg and peak velocity ratio of 0.25. She was admitted in 02/2016 with enterococcal sepsis and a TEE showed what was felt to be moderate AS with an AVA of 1.34 cm2 with mild to moderate AI and normal LV function. There was no evidence of endocarditis. There was moderate pulmonary HTN. Her daughter is with her today and says that since then she has been deteriorating with progressive exertional SOB and fatigue. She was most recently hospitalized in July with acute on chronic diastolic heart failure. She was discharged to a rehab facility and just recently went back home. Her daughter says that she is walking in the house without a cane or walker but gets short of breath easily. She can't go out of the house to do the things she used to do since January.     Past Medical History:  Diagnosis Date  . Anemia   . Bone disease   . Carotid arterial disease ([place])    a. 08/2014 U/S: Bilateral < 50% stenosis. Patent vertebrals w/ antegrade flow.   . Cervical spine arthritis ([place])   . Chronic diastolic CHF (congestive heart failure) ([place])    a. echo 2014: EF 55-60%, DD;  b. 08/2014 Echo: EF 55-60%, no RWMA, GR1DD; c. 02/2016 Echo: EF 65-70%, Gr1 DD.  Marland Kitchen Cognitive impairment   . Depression   . Diabetes mellitus without complication ([place])   . Essential hypertension   . GERD (gastroesophageal reflux disease)   . Glaucoma   . Gout   . Hiatal hernia   . History of renal impairment   .  Hyperlipidemia   . Hypotension    a. Related to Norvasc - 11/2014 ED visit.  . Multinodular goiter    a. Noted incidentally on CT 07/2012 and carotid U/S 08/2014;  b. Nl TSH 11/2014.  Marland Kitchen Paroxysmal SVT (supraventricular tachycardia) ([facility])    a. on Toprol   . Prolapsed uterus   . Severe aortic stenosis    a. echo 08/2014: EF 55-60%, no RWMA, GR1DD, severe AS, mild AI, Mean gradient (S): 38 mm Hg. Valve area (VTI): 0.78 cm^2., mild-mod MR, LA mildly dilated, atrial septal aneurysm, mild TR;  b. 02/2016 Echo: EF 65-70%, Gr1 DD, sev AS, mild to mod AI, Valve area (VTI): 0.94 cm^2, (Vmax) 0.96 cm^2, (Vmean) 0.93cm^2, mild MR, mildly dil LA, nl RV fxn, PASP 27mmHg; c. 03/2016 TEE: EF 55-60, mod AS.  Marland Kitchen Stroke [facility])    a. CT head 07/2014 showed small thalamic infarct  . Vitamin deficiency     Past Surgical History:  Procedure Laterality Date  . TEE WITHOUT CARDIOVERSION N/A 03/31/2016   Procedure: TRANSESOPHAGEAL ECHOCARDIOGRAM (TEE);  Surgeon: Minna Merritts, MD;  Location: [facility] ORS;  Service: Cardiovascular;  Laterality: N/A;  . TUBAL LIGATION      Family History  Problem Relation Age of Onset  . Glaucoma Mother   . Diabetes Mother   . Peptic Ulcer Father   . Colitis  Father     Social History   Social History  . Marital status: Widowed    Spouse name: N/A  . Number of children: N/A  . Years of education: N/A   Occupational History  . Not on file.   Social History Main Topics  . Smoking status: Never Smoker  . Smokeless tobacco: Never Used  . Alcohol use No  . Drug use: No  . Sexual activity: Not on file   Other Topics Concern  . Not on file   Social History Narrative  . No narrative on file    Current Outpatient Prescriptions  Medication Sig Dispense Refill  . acetaminophen (TYLENOL) 500 MG tablet Take 500 mg by mouth every 6 (six) hours as needed for mild pain or headache.    . Amino Acids-Protein Hydrolys (FEEDING SUPPLEMENT, PRO-STAT SUGAR FREE 64,) LIQD Take 30 mLs by  mouth 3 (three) times daily with meals.    Marland Kitchen aspirin 81 MG tablet Take 81 mg by mouth daily.    . brimonidine (ALPHAGAN P) 0.1 % SOLN Place 1 drop into the right eye 2 (two) times daily.    . Cholecalciferol (VITAMIN D) 2000 units CAPS Take 2,000 Units by mouth daily.     . Dorzolamide HCl-Timolol Mal (COSOPT OP) Place 1 drop into the right eye 2 (two) times daily.     . feeding supplement, ENSURE ENLIVE, (ENSURE ENLIVE) LIQD Take 237 mLs by mouth 2 (two) times daily between meals. 237 mL 12  . furosemide (LASIX) 20 MG tablet Take 20 mg by mouth daily as needed (with potassium).    Marland Kitchen gabapentin (NEURONTIN) 300 MG capsule Take 300 mg by mouth 2 (two) times daily.    . hydrALAZINE (APRESOLINE) 50 MG tablet Take 50 mg by mouth 3 (three) times daily as needed (elevated BP).    Marland Kitchen HYDROcodone-acetaminophen (NORCO/VICODIN) 5-325 MG tablet Take 1 tablet by mouth every 6 (six) hours as needed for moderate pain. 30 tablet 0  . iron polysaccharides (NIFEREX) 150 MG capsule Take 150 mg by mouth 2 (two) times daily.    . isosorbide mononitrate (IMDUR) 30 MG 24 hr tablet Take 1 tablet (30 mg total) by mouth daily. 30 tablet 0  . latanoprost (XALATAN) 0.005 % ophthalmic solution Place 1 drop into the right eye at bedtime.  3  . lisinopril (PRINIVIL,ZESTRIL) 10 MG tablet Take 10 mg by mouth daily.    Marland Kitchen LORazepam (ATIVAN) 0.5 MG tablet Take 1 tablet (0.5 mg total) by mouth at bedtime. Take 1 tablet at bedtime as needed for sleep or anxiety 60 tablet 0  . lovastatin (MEVACOR) 40 MG tablet Take 40 mg by mouth at bedtime.    . metoprolol succinate (TOPROL-XL) 25 MG 24 hr tablet Take 25 mg by mouth daily.    . pantoprazole (PROTONIX) 40 MG tablet Take 40 mg by mouth daily.    . pilocarpine (PILOCAR) 4 % ophthalmic solution Place 1 drop into the right eye 4 (four) times daily.     Marland Kitchen tiZANidine (ZANAFLEX) 4 MG tablet Take 1 tablet by mouth 3 (three) times daily as needed for muscle spasms.   1  . traZODone (DESYREL)  100 MG tablet Take 100 mg by mouth at bedtime.    . vitamin C (ASCORBIC ACID) 250 MG tablet Take 250 mg by mouth daily.    . potassium chloride (K-DUR) 10 MEQ tablet Take 1 tablet (10 mEq total) by mouth daily. Take with lasix 30 tablet 6  No current facility-administered medications for this visit.     Allergies  Allergen Reactions  . Metformin And Related Diarrhea      Review of Systems:   General:  normal appetite, reduced energy, no weight gain, no weight loss, no fever  Cardiac:  no chest pain with exertion, no chest pain at rest, has SOB with minimal exertion, no resting SOB, no PND, no orthopnea, no palpitations, no arrhythmia, no atrial fibrillation, has LE edema, no dizzy spells, no syncope  Respiratory:  exertional shortness of breath, no home oxygen, no productive cough, no dry cough, no bronchitis, no wheezing, no hemoptysis, no asthma, no pain with inspiration or cough, no sleep apnea, no CPAP at night  GI:   no difficulty swallowing, no reflux, no frequent heartburn, has hiatal hernia, no abdominal pain, has constipation, no diarrhea, no hematochezia, no hematemesis, no melena  GU:   no dysuria,  no frequency, no urinary tract infection, no hematuria,  no kidney stones, no kidney disease  Vascular:  no pain suggestive of claudication, no pain in feet, no leg cramps, no varicose veins, no DVT, no non-healing foot ulcer  Neuro:   Hx of stroke, no TIA's, no seizures, no headaches, no temporary blindness one eye,  no slurred speech, no peripheral neuropathy, no chronic pain, has instability of gait, no memory/cognitive dysfunction  Musculoskeletal: has arthritis, no joint swelling, no myalgias, some difficulty walking, reduced mobility   Skin:   no rash, no itching, no skin infections, no pressure sores or ulcerations  Psych:   no anxiety, no depression, no nervousness, no unusual recent stress  Eyes:   no blurry vision, no floaters, no recent vision changes,  wears glasses   ENT:   no hearing loss, no loose or painful teeth, wears dentures, has not seen a dentist in several years  Hematologic:  no easy bruising, no abnormal bleeding, no clotting disorder, no frequent epistaxis  Endocrine:  has diabetes, does  check CBG's at home        Physical Exam:   BP (!) 170/60   Pulse 87   Resp 20   Ht 5' (1.524 m)   Wt 126 lb (57.2 kg)   SpO2 97% Comment: RA  BMI 24.61 kg/m   General:  Frail, elderly woman in no distress  HEENT:  Unremarkable, NCAT, PERLA, EOMI, oropharynx clear  Neck:   no JVD, no bruits, no adenopathy or thyromegaly  Chest:   clear to auscultation, symmetrical breath sounds, no wheezes, no rhonchi   CV:   RRR, grade III/VI crescendo/decrescendo murmur heard best at RSB,  no diastolic murmur  Abdomen:  soft, non-tender, no masses or organomegaly  Extremities:  warm, well-perfused, pulses not palpable, no LE edema  Rectal/GU  Deferred  Neuro:   Grossly non-focal and symmetrical throughout, flat affect  Skin:   Clean and dry, no rashes, no breakdown   Diagnostic Tests:                  [facility]*                       [address] [place], [place] [address]                            [phone]  -------------------------------------------------------------------  Transesophageal Echocardiography  Patient:    Ashelyn, Mccravy MR #:       [id number] Study Date: 03/31/2016 Gender:     F Age:        80 Height: Weight: BSA: Pt. Status: Room:   SONOGRAPHER  [facility] RDCS  ATTENDING    Gladstone Lighter  Ronaldo Miyamoto, Radhika  REFERRING    [place], [place]  PERFORMING   [facility], [facility]  cc:  ------------------------------------------------------------------- LV EF: 55% -   60%  ------------------------------------------------------------------- Indications:      Bacteremia 790.7. Severe aortic valve stenosis.    ------------------------------------------------------------------- Study Conclusions  - Left ventricle: Systolic function was normal. The estimated   ejection fraction was in the range of 55% to 60%. Wall motion was   normal; there were no regional wall motion abnormalities. - Aortic valve: Trileaflet; severely calcified leaflets. Estimated   AVA of 1.34 cm sq There was moderate stenosis. There was mild to   moderate regurgitation directed eccentrically in the LVOT. - Aorta: There was mild to moderate diffuse atheroma. - Left atrium: No evidence of thrombus in the atrial cavity or   appendage. - Right atrium: No evidence of thrombus in the atrial cavity or   appendage. - Atrial septum: There was a patent foramen ovale. Doppler shunt   from left to right noted also positive saline contrst bubble   study at rest (valsalva not performed). - Tricuspid valve: There was moderate regurgitation. - Pulmonary arteries: PA peak pressure: 60 mm Hg (S).  Impressions:  - No evidence of endocarditis. The right ventricular systolic   pressure was increased consistent with moderate pulmonary   hypertension.  ------------------------------------------------------------------- Study data:   Procedure:  Initial setup. The patient was brought to the laboratory. Surface ECG leads were monitored. Sedation. Conscious sedation was administered. Transesophageal echocardiography. Topical anesthesia was obtained using viscous lidocaine. A transesophageal probe was inserted by the attending cardiologist. Image quality was adequate.  Study completion:  The patient tolerated the procedure well. There were no complications.         Diagnostic transesophageal echocardiography.  2D and color Doppler.  Birthdate:  Patient birthdate: 11/12/1924.  Age:  Patient is 83 yr old.  Sex:  Gender: female.  Study date:  Study date: 03/31/2016. Study time: 09:08  AM.  -------------------------------------------------------------------  ------------------------------------------------------------------- Left ventricle:  Systolic function was normal. The estimated ejection fraction was in the range of 55% to 60%. Wall motion was normal; there were no regional wall motion abnormalities.  ------------------------------------------------------------------- Aortic valve:  Trileaflet; severely calcified leaflets. Estimated AVA of 1.34 cm sq Cusp separation was normal.  Doppler:   There was moderate stenosis.   There was mild to moderate regurgitation directed eccentrically in the LVOT.  ------------------------------------------------------------------- Aorta:  There was moderate diffuse atheroma. There was no evidence for dissection. Aortic root: The aortic root was not dilated. Ascending aorta: The ascending aorta was normal in size. Aortic arch: The aortic arch was normal in size. Descending aorta: The descending aorta was normal in size.  ------------------------------------------------------------------- Mitral valve:   Structurally normal valve.   Leaflet separation was normal.  Doppler:  There was trivial regurgitation.  ------------------------------------------------------------------- Left atrium:  The atrium was normal in size.  No evidence of thrombus in the atrial cavity or appendage. The appendage was morphologically a left appendage, multilobulated, and of normal size. Emptying velocity was normal.  ------------------------------------------------------------------- Atrial septum:  Well visualized. There was a patent foramen ovale. Doppler shunt from left to right  noted also positive saline contrst bubble study at rest (valsalva not performed).  ------------------------------------------------------------------- Right ventricle:  The cavity size was normal. Wall thickness was normal. Systolic function was  normal.  ------------------------------------------------------------------- Pulmonic valve:    Structurally normal valve.    Doppler:  There was trivial regurgitation.  ------------------------------------------------------------------- Tricuspid valve:   Structurally normal valve.   Leaflet separation was normal.  Doppler:  There was moderate regurgitation.  ------------------------------------------------------------------- Pulmonary artery:   The main pulmonary artery was mildly dilated.   ------------------------------------------------------------------- Right atrium:  The atrium was normal in size.  No evidence of thrombus in the atrial cavity or appendage. The appendage was morphologically a right appendage.  ------------------------------------------------------------------- Pericardium:  There was no pericardial effusion.  ------------------------------------------------------------------- Measurements   Pulmonary arteries           Value       Reference  PA pressure, S, DP (H)       60    mm Hg <=30  Legend: (L)  and  (H)  mark values outside specified reference range.  ------------------------------------------------------------------- Prepared and Electronically Authenticated by  Esmond Plants, MD, [facility] 2018-02-01T18:33:05          *[address]                        [place], [place] [address]                            [phone]  ------------------------------------------------------------------- Transthoracic Echocardiography  Patient:    Lockie, Bothun MR #:       [id number] Study Date: 03/23/2016 Gender:     F Age:        33 Height:     152.4 cm Weight:     65.9 kg BSA:        1.69 m^2 Pt. Status: Room:   ATTENDING    Default, Provider [phone]  SONOGRAPHER  [address], [facility], RDCS  REFERRING    Esmond Plants, MD, [facility]  ORDERING     Gollan, Tim  REFERRING    Gollan, Tim  PERFORMING    [facility], [place]  cc:  ------------------------------------------------------------------- LV EF: 65% -   70%  ------------------------------------------------------------------- History:   PMH:  Acquired from the patient and from the patient&'s chart.  Fatigue, weakness, dyspnea, and bilateral lower extremity edema.  Risk factors:  Lifelong nonsmoker. Hypertension. Diabetes mellitus. Dyslipidemia.  ------------------------------------------------------------------- Study Conclusions  - Left ventricle: The cavity size was at the upper limits of   normal. Wall thickness was increased in a pattern of moderate   LVH. Systolic function was vigorous. The estimated ejection   fraction was in the range of 65% to 70%. Wall motion was normal;   there were no regional wall motion abnormalities. Doppler   parameters are consistent with abnormal left ventricular   relaxation (grade 1 diastolic dysfunction). Doppler parameters   are consistent with high ventricular filling pressure. - Aortic valve: Trileaflet; severely thickened, moderately   calcified leaflets. Transvalvular velocity was increased. There   was severe stenosis. There was mild to moderate regurgitation.   Valve area (VTI): 0.94 cm^2. Valve area (Vmax): 0.96 cm^2. Valve   area (Vmean): 0.93 cm^2. - Mitral valve: Calcified annulus. Mildly thickened leaflets .   There was mild regurgitation. - Left atrium: The atrium  was mildly dilated. - Right ventricle: The cavity size was normal. Systolic function   was normal. - Atrial septum: There was an atrial septal aneurysm. - Pulmonary arteries: Systolic pressure was severely increased. PA   peak pressure: 75 mm Hg (S). - Pericardium, extracardiac: A trivial pericardial effusion was   identified.  ------------------------------------------------------------------- Study data:  The previous study was not available, so comparison was made to the report of 09/11/2014.  Study  status:  Routine. Procedure:  Transthoracic echocardiography. Image quality was good.  Study completion:  There were no complications. Transthoracic echocardiography.  M-mode, complete 2D, spectral Doppler, and color Doppler.  Birthdate:  Patient birthdate: 1924-11-21.  Age:  Patient is 83 yr old.  Sex:  Gender: female. BMI: 28.4 kg/m^2.  Blood pressure:     140/82  Patient status: Outpatient.  Study date:  Study date: 03/23/2016. Study time: 11:40 AM.  -------------------------------------------------------------------  ------------------------------------------------------------------- Left ventricle:  The cavity size was at the upper limits of normal. Wall thickness was increased in a pattern of moderate LVH. Systolic function was vigorous. The estimated ejection fraction was in the range of 65% to 70%. Wall motion was normal; there were no regional wall motion abnormalities. Doppler parameters are consistent with abnormal left ventricular relaxation (grade 1 diastolic dysfunction). Doppler parameters are consistent with high ventricular filling pressure. Global longitudinal strain is -21.8%.   ------------------------------------------------------------------- Aortic valve:   Trileaflet; severely thickened, moderately calcified leaflets. Cusp separation was reduced.  Doppler: Transvalvular velocity was increased. There was severe stenosis. There was mild to moderate regurgitation.    VTI ratio of LVOT to aortic valve: 0.33. Valve area (VTI): 0.94 cm^2. Indexed valve area (VTI): 0.56 cm^2/m^2. Peak velocity ratio of LVOT to aortic valve: 0.34. Valve area (Vmax): 0.96 cm^2. Indexed valve area (Vmax): 0.57 cm^2/m^2. Mean velocity ratio of LVOT to aortic valve: 0.33. Valve area (Vmean): 0.93 cm^2. Indexed valve area (Vmean): 0.55 cm^2/m^2.    Mean gradient (S): 60 mm Hg. Peak gradient (S): 97 mm Hg.  ------------------------------------------------------------------- Aorta:   Aortic root: The aortic root was normal in size. Ascending aorta: The ascending aorta was normal in size.  ------------------------------------------------------------------- Mitral valve:   Calcified annulus. Mildly thickened leaflets . Leaflet separation was normal.  Doppler:  Transvalvular velocity was within the normal range. There was no evidence for stenosis. There was mild regurgitation.    Peak gradient (D): 8 mm Hg.  ------------------------------------------------------------------- Left atrium:  The atrium was mildly dilated.  ------------------------------------------------------------------- Atrial septum:  The interatrial septum was hypermobile. There was an atrial septal aneurysm.  ------------------------------------------------------------------- Right ventricle:  The cavity size was normal. Systolic function was normal.  ------------------------------------------------------------------- Pulmonic valve:    Structurally normal valve.   Cusp separation was normal.  Doppler:  Transvalvular velocity was within the normal range. There was no regurgitation.  ------------------------------------------------------------------- Tricuspid valve:   Structurally normal valve.   Leaflet separation was normal.  Doppler:  Transvalvular velocity was within the normal range. There was mild regurgitation.  ------------------------------------------------------------------- Pulmonary artery:   Systolic pressure was severely increased.  ------------------------------------------------------------------- Right atrium:  The atrium was normal in size.  ------------------------------------------------------------------- Pericardium:  A trivial pericardial effusion was identified.  ------------------------------------------------------------------- Systemic veins: Inferior vena cava: The vessel was normal in size. The respirophasic diameter changes were in the normal  range (>= 50%), consistent with normal central venous pressure.  ------------------------------------------------------------------- Measurements   Left ventricle  Value          Reference  LV ID, ED, PLAX chordal                  45.5  mm       43 - 52  LV ID, ES, PLAX chordal                  25.2  mm       23 - 38  LV fx shortening, PLAX chordal           45    %        >=29  LV PW thickness, ED                      13.4  mm       ----------  IVS/LV PW ratio, ED                      1.19           <=1.3  Stroke volume, 2D                        101   ml       ----------  Stroke volume/bsa, 2D                    60    ml/m^2   ----------  LV ejection fraction, 1-p A4C            80    %        ----------  LV end-diastolic volume, 2-p             51    ml       ----------  LV end-systolic volume, 2-p              9     ml       ----------  LV ejection fraction, 2-p                82    %        ----------  Stroke volume, 2-p                       42    ml       ----------  LV end-diastolic volume/bsa, 2-p         30    ml/m^2   ----------  LV end-systolic volume/bsa, 2-p          5     ml/m^2   ----------  Stroke volume/bsa, 2-p                   24.7  ml/m^2   ----------  LV e&', lateral                           6.85  cm/s     ----------  LV E/e&', lateral                         20.15          ----------  LV e&', medial                            3.81  cm/s     ----------  LV E/e&', medial                          36.22          ----------  LV e&', average                           5.33  cm/s     ----------  LV E/e&', average                         25.89          ----------  Longitudinal strain, TDI                 22    %        ----------    Ventricular septum                       Value          Reference  IVS thickness, ED                        15.9  mm       ----------    LVOT                                     Value          Reference  LVOT  ID, S                               19    mm       ----------  LVOT area                                2.84  cm^2     ----------  LVOT peak velocity, S                    167   cm/s     ----------  LVOT mean velocity, S                    121   cm/s     ----------  LVOT VTI, S                              35.4  cm       ----------  LVOT peak gradient, S                    11    mm Hg    ----------    Aortic valve                             Value          Reference  Aortic valve peak velocity, S            493   cm/s     ----------  Aortic valve mean velocity, S            370   cm/s     ----------  Aortic valve VTI, S                      107   cm       ----------  Aortic mean gradient, S                  60    mm Hg    ----------  Aortic peak gradient, S                  97    mm Hg    ----------  VTI ratio, LVOT/AV                       0.33           ----------  Aortic valve area, VTI                   0.94  cm^2     ----------  Aortic valve area/bsa, VTI               0.56  cm^2/m^2 ----------  Velocity ratio, peak, LVOT/AV            0.34           ----------  Aortic valve area, peak velocity         0.96  cm^2     ----------  Aortic valve area/bsa, peak              0.57  cm^2/m^2 ----------  velocity  Velocity ratio, mean, LVOT/AV            0.33           ----------  Aortic valve area, mean velocity         0.93  cm^2     ----------  Aortic valve area/bsa, mean              0.55  cm^2/m^2 ----------  velocity  Aortic regurg pressure half-time         267   ms       ----------    Aorta                                    Value          Reference  Aortic root ID, ED                       30    mm       ----------  Ascending aorta ID, A-P, S               34    mm       ----------    Left atrium                              Value          Reference  LA ID, A-P, ES                           39    mm       ----------  LA ID/bsa, A-P                    (H)  2.3   cm/m^2   <=2.2   LA volume, S                             58.2  ml       ----------  LA volume/bsa, S                         34.4  ml/m^2   ----------  LA volume, ES, 1-p A4C                   61.1  ml       ----------  LA volume/bsa, ES, 1-p A4C               36.1  ml/m^2   ----------  LA volume, ES, 1-p A2C                   50.9  ml       ----------  LA volume/bsa, ES, 1-p A2C               30.1  ml/m^2   ----------    Mitral valve                             Value          Reference  Mitral E-wave peak velocity              138   cm/s     ----------  Mitral A-wave peak velocity              197   cm/s     ----------  Mitral deceleration time          (L)    116   ms       150 - 230  Mitral peak gradient, D                  8     mm Hg    ----------  Mitral E/A ratio, peak                   0.7            ----------    Pulmonary arteries                       Value          Reference  PA pressure, S, DP                (H)    75    mm Hg    <=30    Tricuspid valve                          Value          Reference  Tricuspid peak RV-RA gradient            53    mm Hg    ----------    Right atrium                             Value          Reference  RA ID, S-I, ES, A4C               (  H)    53.9  mm       34 - 49  RA area, ES, A4C                         13.9  cm^2     8.3 - 19.5  RA volume, ES, A/L                       29.3  ml       ----------  RA volume/bsa, ES, A/L                   17.3  ml/m^2   ----------    Right ventricle                          Value          Reference  TAPSE                                    34.4  mm       ----------  RV s&', lateral, S                        11.7  cm/s     ----------    Pulmonic valve                           Value          Reference  Pulmonic valve peak velocity, S          143   cm/s     ----------  Pulmonic regurg velocity, ED             155   cm/s     ----------  Pulmonic regurg gradient, ED             10    mm Hg    ----------  Legend: (L)   and  (H)  mark values outside specified reference range.  ------------------------------------------------------------------- Prepared and Electronically Authenticated by  Nelva Bush, MD 2018-01-24T16:36:10   Physical Therapy Evaluation  Patient Details  Name: [name] MRN: [id number] Date of Birth: August 06, 1924 Referring Provider: Dr. [name]  Encounter Date: 10/11/2016              PT End of Session - 10/11/16 1414    Visit Number 1   PT Start Time 1410   PT Stop Time 1509   PT Time Calculation (min) 59 min   Equipment Utilized During Treatment Gait belt      Past Medical History:  Diagnosis Date  . Anemia   . Bone disease   . Carotid arterial disease ([place])    a. 08/2014 U/S: Bilateral < 50% stenosis. Patent vertebrals w/ antegrade flow.   . Cervical spine arthritis ([place])   . Chronic diastolic CHF (congestive heart failure) ([place])    a. echo 2014: EF 55-60%, DD;  b. 08/2014 Echo: EF 55-60%, no RWMA, GR1DD; c. 02/2016 Echo: EF 65-70%, Gr1 DD.  Marland Kitchen Cognitive impairment   . Depression   . Diabetes mellitus without complication ([place])   . Essential hypertension   . GERD (gastroesophageal reflux disease)   . Glaucoma   . Gout   . Hiatal hernia   .  History of renal impairment   . Hyperlipidemia   . Hypotension    a. Related to Norvasc - 11/2014 ED visit.  . Multinodular goiter    a. Noted incidentally on CT 07/2012 and carotid U/S 08/2014;  b. Nl TSH 11/2014.  Marland Kitchen Paroxysmal SVT (supraventricular tachycardia) ([facility])    a. on Toprol   . Prolapsed uterus   . Severe aortic stenosis    a. echo 08/2014: EF 55-60%, no RWMA, GR1DD, severe AS, mild AI, Mean gradient (S): 38 mm Hg. Valve area (VTI): 0.78 cm^2., mild-mod MR, LA mildly dilated, atrial septal aneurysm, mild TR;  b. 02/2016 Echo: EF 65-70%, Gr1 DD, sev AS, mild to mod AI, Valve area (VTI): 0.94 cm^2, (Vmax) 0.96 cm^2, (Vmean) 0.93cm^2, mild MR, mildly dil LA,  nl RV fxn, PASP 78mmHg; c. 03/2016 TEE: EF 55-60, mod AS.  Marland Kitchen Stroke [facility])    a. CT head 07/2014 showed small thalamic infarct  . Vitamin deficiency          Past Surgical History:  Procedure Laterality Date  . TEE WITHOUT CARDIOVERSION N/A 03/31/2016   Procedure: TRANSESOPHAGEAL ECHOCARDIOGRAM (TEE);  Surgeon: Minna Merritts, MD;  Location: [facility] ORS;  Service: Cardiovascular;  Laterality: N/A;  . TUBAL LIGATION      There were no vitals filed for this visit.               Subjective Assessment - 10/11/16 1417    Subjective Pt/daughter report patient has not returned back to normal since her January hospitalization. Prior to that patient was working in the yard, travelling to [employer], shopping, Social research officer, government. Pt is now limited to household activities and going out for MD appointments.    Patient Stated Goals hopes to return to yardwork and PLOF   Currently in Pain? No/denies                  [facility] PT Assessment - 10/11/16 0001            Assessment   Medical Diagnosis severe aortic stenosis   Referring Provider Dr. [name]   Onset Date/Surgical Date --  Jan 2018       Precautions   Precautions None       Restrictions   Weight Bearing Restrictions No       Balance Screen   Has the patient fallen in the past 6 months No   Has the patient had a decrease in activity level because of a fear of falling?  No   Is the patient reluctant to leave their home because of a fear of falling?  No       Home Environment   Living Environment Private residence   Living Arrangements Alone  daughters provide 24/7 care   [place] to enter   Entrance Stairs-Number of Steps 3   Entrance Stairs-Rails Left   [place] One level       Posture/Postural Control   Posture/Postural Control Postural limitations   Postural Limitations Forward head;Rounded Shoulders       ROM / Strength   AROM / PROM / Strength AROM;Strength        AROM   Overall AROM Comments shoulder flexion limited by 25-50%, otherwise [facility]       Strength   Overall Strength Comments grossly 4+/5 on R side and 4-/5 on left side   Strength Assessment Site Hand   Right/Left hand Right;Left   Right Hand Grip (lbs) 18   Left  Hand Grip (lbs) unable  L hand dominant       Ambulation/Gait   Gait Comments Pt ambulates without a device with CGA, decreased step length bil and severe forward head posture/rounded shoulders. Pt's gait distance was limited by 72% for age/gender.                  [facility] Pre-Surgical Assessment - 10/11/16 0001    5 Meter Walk Test- trial 1 10 sec   5 Meter Walk Test- trial 2 11 sec.    5 Meter Walk Test- trial 3 8 sec.   5 meter walk test average 9.67 sec   4 Stage Balance Test tolerated for:  10 sec.   4 Stage Balance Test Position 2   Comment needs UE support   ADL/IADL Needs Assistance with: Bathing;Dressing;Meal prep;Finances;Yard work   ADL/IADL Therapist, sports Index Midly frail   6 Minute Walk- Baseline yes   BP (mmHg) 178/46   HR (bpm) 79   02 Sat (%RA) 99 %   Modified Borg Scale for Dyspnea 0- Nothing at all   Perceived Rate of Exertion (Borg) 6-   6 Minute Walk Post Test yes   BP (mmHg) 196/52  170/46 after 5 minutes   HR (bpm) 114   02 Sat (%RA) 96 %   Modified Borg Scale for Dyspnea 4- somewhat severe   Perceived Rate of Exertion (Borg) 11- Fairly light   Aerobic Endurance Distance Walked 360      Objective measurements completed on examination: See above findings.                PT Education - 10/11/16 1533    Education provided Yes   Education Details fall risk, considered trialing SPC with HH PT   Person(s) Educated Patient;Child(ren)   Methods Explanation   Comprehension Verbalized understanding                     Plan - 10/11/16 1533    Clinical Impression Statement see below   PT Frequency One time visit      Clinical Impression Statement: Pt is a motivated 83 yo female presenting to OP PT for evaluation prior to possible TAVR surgery due to severe aortic stenosis. Pt reports onset of a general decline in function after her hospitalization in Jan 2018. She was hospitalized again in July for 4 days due to heart failure, underwent therapy at a SNF and is now receiving home health services. She continues to get stronger but is limited by shortness of breath and fatigue. She has a great support system. Patient lives alone but daughters provided 24/7 care. She has an aide that bathes her. Her daughters assist with meals, finances, etc. Her L hand is swollen and hypersensitive and this causes her to need a lot of assistance with household activities. Pt ambulates independently in her house and with supervision min A when leaving the house. Pt presents with fair ROM and strength - her L side is slightly weaker than her R throughout and has decreased coordination, poor balance and is at high fall risk 4 stage balance test, slow walking speed and poor aerobic endurance per 6 minute walk test. Pt ambulated 185 feet in 2:45 before requesting a seated rest beak lasting 1:00. At time of rest, patient's HR was 107 bpm and O2 was 98 on room air. Pt able to resume after rest and ambulate an additional 175 feet. Pt ambulated a total of 360 feet in 6 minute  walk. BP increased from 178/46 at rest to 196/52 6 minute walk test. It recovered to 170/46 after 5 minutes. Based on the Short Physical Performance Battery, patient has a frailty rating of 3/12 with </= 5/12 considered frail. I anticipate with continued HHPT, pt will continue to improve her functional mobility.   Patient demonstrated the following deficits and impairments:     Visit Diagnosis: Other abnormalities of gait and mobility  Abnormal posture  Muscle weakness (generalized)              G-Codes - 2016/10/12 1534    Functional Assessment Tool Used  (Outpatient Only) 6 minute walk 360'   Functional Limitation Mobility: Walking and moving around   Mobility: Walking and Moving Around Current Status [phone]) At least 60 percent but less than 80 percent impaired, limited or restricted   Mobility: Walking and Moving Around Goal Status [phone]) At least 60 percent but less than 80 percent impaired, limited or restricted   Mobility: Walking and Moving Around Discharge Status [phone]) At least 60 percent but less than 80 percent impaired, limited or restricted       Problem List Patient Active Problem List   Diagnosis Date Noted  . Bitter taste 09/26/2016  . Cognitive impairment 09/26/2016  . Glaucoma 09/26/2016  . Gout 09/26/2016  . Hand pain, left 09/26/2016  . Headache 09/26/2016  . Hiatal hernia with gastroesophageal reflux 09/26/2016  . Hypokalemia 09/26/2016  . Left arm pain 09/26/2016  . Lumbar and sacral spondylarthritis 09/26/2016  . Multiple lacunar infarcts ([place]) 09/26/2016  . Multiple pulmonary nodules 09/26/2016  . No appetite 09/26/2016  . Obesity 09/26/2016  . Recurrent UTI 09/26/2016  . Shoulder pain, bilateral 09/26/2016  . Trigeminal neuralgia 09/26/2016  . Urinary leakage 09/26/2016  . Uterine prolapse 09/26/2016  . Vitamin D deficiency 09/26/2016  . Malnutrition ([place]) 08/30/2016  . Anemia due to unknown mechanism 08/30/2016  . Acute respiratory distress 08/30/2016  . Acute on chronic diastolic CHF (congestive heart failure) ([place]) 08/29/2016  . Aortic stenosis 05/03/2016  . Enterococcal bacteremia 03/30/2016  . Viral upper respiratory tract infection   . Acute back pain   . Sepsis ([place])   . Elevated troponin   . CAP (community acquired pneumonia) 03/28/2016  . Fatigue 12/14/2015  . Frequency of urination 12/14/2015  . Osteoporosis 10/29/2015  . Leg swelling 09/14/2015  . Bilateral lower extremity edema 07/07/2015  . Hypertension   . Hyperlipidemia   . Diabetes mellitus without complication  ([place])   . PSVT (paroxysmal supraventricular tachycardia) ([place])   . Multiple thyroid nodules   . Carotid arterial disease ([place])   . Hypotension   . Sleepiness 12/25/2014  . Hospital discharge follow-up 09/24/2014  . NSTEMI (non-ST elevated myocardial infarction) ([place]) 09/10/2014  . Carotid atherosclerosis, bilateral 09/10/2014  . Renal insufficiency 08/28/2014  . Bladder prolapse, female, acquired 08/28/2014  . Confusion 08/28/2014  . Hydronephrosis, bilateral 08/28/2014  . Medicare annual wellness visit, subsequent 07/23/2014  . Weakness 07/19/2014  . Diarrhea 06/16/2014  . Diabetic polyneuropathy associated with type 2 diabetes mellitus ([place]) 03/10/2014  . Hammer toe of left foot 03/10/2014  . Hammer toe of right foot 03/10/2014  . Metatarsalgia of left foot 03/10/2014  . Metatarsalgia of right foot 03/10/2014  . Onychogryphosis 03/10/2014  . Orofacial dyskinesia 02/28/2013  . SVT (supraventricular tachycardia) ([place]) 12/17/2012  . Diabetes type 2, controlled ([place]) 12/17/2012  . SOB (shortness of breath) 12/17/2012  . Essential hypertension, malignant 12/17/2012  . Chronic diastolic CHF (congestive  heart failure) ([place]) 12/17/2012  . Hypomagnesemia 09/22/2012  . Weight loss 09/12/2012  . Cervical spine arthritis ([place]) 06/29/2010    [place], PT 10/11/2016, 3:35 PM  [facility] [address] [place], [place], [address] Phone: [phone]   Fax:  [phone]  Name: Mika Griffitts MRN: [id number] Date of Birth: 08-04-1924     Electronically signed by London Pepper, PT at 10/11/2016 4:35 PM       STS Risk Calculator: Risk of Mortality: 6.689%  Morbidity or Mortality: 33.11%  Long Length of Stay: 21.961%  Short Length of Stay: 9.334%  Permanent Stroke: 6.015%  Prolonged Ventilation: 25.303%  DSW Infection: 0.117%  Renal Failure: 12.166%  Reoperation: 12.184%   Impression:  This 83 year  old woman has stage D, severe, symptomatic aortic stenosis with NYHA class 3 symptoms of exertional shortness of breath and fatigue and recurrent admissions for acute on chronic diastolic heart failure with worsening shortness of breath and peripheral edema. She is frail as noted in her PT evaluation and I don't think she is a candidate for open surgical AVR due to her advanced age and frailty. TAVR may be a reasonable alternative although with her degree of frailty at her age I don't know if she will make a good functional recovery. The patient and her daughter were counseled at length regarding treatment alternatives for management of severe symptomatic aortic stenosis. The risks and benefits of surgical intervention has been discussed in detail. Long-term prognosis with medical therapy was discussed. Alternative approaches such as conventional surgical aortic valve replacement, transcatheter aortic valve replacement, and palliative medical therapy were compared and contrasted at length. This discussion was placed in the context of the patient's own specific clinical presentation and past medical history. All of their questions been addressed. The patient and her daughter are eager to proceed with surgical management as soon as possible. Her daughter says that she was quite active until January 2018 and she feels like her mother can get back to that level if her aortic stenosis is treated. Since they are interested in further treatment I think we should proceed with cath and if that is acceptable then proceed with a gated cardiac CTA and CTA of the chest, abdomen and pelvis. She and her daughter understand that these test may reveal information that would prohibit TAVR and that she is not a candidate for open surgery.     Plan:  She will have a cardiac cath set up with Dr. Burt Knack and if that is acceptable for TAVR then she will be se up for her CTA studies followed by a second surgical evaluation.  I spent  60 minutes performing this consultation and > 50% of this time was spent face to face counseling and coordinating the care of this patient's severe aortic stenosis.    Gaye Pollack, MD 10/13/2016

## 2016-10-20 ENCOUNTER — Telehealth: Payer: Self-pay | Admitting: Cardiovascular Disease

## 2016-10-20 NOTE — Telephone Encounter (Signed)
[name] with home health calling she is with patient (but is leaving in a little bit)   The patient is complaining of SOB  BP 179/58  She has already taken her morning medications She denies any pain just SOB   Would like advise on this Please call back

## 2016-10-20 NOTE — Telephone Encounter (Signed)
Sandrea RN with [facility] reports that patients family called her this morning. Family said that she was very short of breath. Nurse went to see her and assessment she was short of breath but oxygen saturations were normal. She did have some edema to lower extremities and took lasix and ativan due to anxiety. She reviewed with family that if she were to get worse then report to ED. Advised that patient was scheduled for procedure next week. Let her know that I would reach out and check on her. She was appreciative for the call back and has no further questions at this time.

## 2016-10-20 NOTE — Telephone Encounter (Signed)
Spoke with patients daughter per release form and she states that her mother appears to be doing better with no signs of breathing problems. Reviewed that Nurse had called earlier but she reports that now she looks good. She confirmed that her mother is having procedure next week and hoping that will provide some answers for them. She was very appreciative for the call back to check on her and had no further questions or concerns at this time.

## 2016-10-20 NOTE — Telephone Encounter (Signed)
Left voicemail message for nurse to call back. 

## 2016-10-24 ENCOUNTER — Telehealth: Payer: Self-pay

## 2016-10-24 NOTE — Telephone Encounter (Signed)
Spoke with daughter [name] per [employer].  Patient contacted pre-catheterization at [facility] scheduled for:  10/26/2016 @ 1130 Verified arrival time and place:  NT @ 0900 Confirmed AM meds to be taken pre-cath with sip of water:   Take ASA Hold Lasix Confirmed patient has responsible person to drive home post procedure and observe patient for 24 hours: daughter Addl concerns:   All questions answered to daughter satisfaction.

## 2016-10-26 ENCOUNTER — Encounter (HOSPITAL_COMMUNITY)
Admission: RE | Disposition: A | Discharge status: Home / Self Care | Payer: Self-pay | Source: Ambulatory Visit | Attending: Cardiovascular Disease

## 2016-10-26 ENCOUNTER — Ambulatory Visit (HOSPITAL_COMMUNITY)
Admission: RE | Admit: 2016-10-26 | Discharge: 2016-10-26 | Disposition: A | Discharge status: Home / Self Care | Payer: Medicare HMO | Source: Ambulatory Visit | Attending: Cardiovascular Disease | Admitting: Cardiovascular Disease

## 2016-10-26 DIAGNOSIS — Z8673 Personal history of transient ischemic attack (TIA), and cerebral infarction without residual deficits: Secondary | ICD-10-CM | POA: Insufficient documentation

## 2016-10-26 DIAGNOSIS — E119 Type 2 diabetes mellitus without complications: Secondary | ICD-10-CM | POA: Insufficient documentation

## 2016-10-26 DIAGNOSIS — I110 Hypertensive heart disease with heart failure: Secondary | ICD-10-CM | POA: Insufficient documentation

## 2016-10-26 DIAGNOSIS — I272 Pulmonary hypertension, unspecified: Secondary | ICD-10-CM | POA: Diagnosis not present

## 2016-10-26 DIAGNOSIS — Z7982 Long term (current) use of aspirin: Secondary | ICD-10-CM | POA: Diagnosis not present

## 2016-10-26 DIAGNOSIS — I5032 Chronic diastolic (congestive) heart failure: Secondary | ICD-10-CM | POA: Diagnosis not present

## 2016-10-26 DIAGNOSIS — I2510 Atherosclerotic heart disease of native coronary artery without angina pectoris: Secondary | ICD-10-CM | POA: Insufficient documentation

## 2016-10-26 DIAGNOSIS — Z79899 Other long term (current) drug therapy: Secondary | ICD-10-CM | POA: Diagnosis not present

## 2016-10-26 DIAGNOSIS — I350 Nonrheumatic aortic (valve) stenosis: Secondary | ICD-10-CM

## 2016-10-26 DIAGNOSIS — E785 Hyperlipidemia, unspecified: Secondary | ICD-10-CM | POA: Diagnosis not present

## 2016-10-26 HISTORY — PX: RIGHT/LEFT HEART CATH AND CORONARY ANGIOGRAPHY: CATH118266

## 2016-10-26 LAB — POCT I-STAT 3, VENOUS BLOOD GAS (G3P V)
Acid-Base Excess: 5 mmol/L — ABNORMAL HIGH (ref 0.0–2.0)
Bicarbonate: 30.8 mmol/L — ABNORMAL HIGH (ref 20.0–28.0)
O2 SAT: 71 %
PCO2 VEN: 48.5 mmHg (ref 44.0–60.0)
TCO2: 32 mmol/L (ref 22–32)
pH, Ven: 7.41 (ref 7.250–7.430)
pO2, Ven: 37 mmHg (ref 32.0–45.0)

## 2016-10-26 LAB — BASIC METABOLIC PANEL
Anion gap: 7 (ref 5–15)
BUN: 17 mg/dL (ref 6–20)
CALCIUM: 9.5 mg/dL (ref 8.9–10.3)
CO2: 31 mmol/L (ref 22–32)
CREATININE: 0.93 mg/dL (ref 0.44–1.00)
Chloride: 103 mmol/L (ref 101–111)
GFR calc Af Amer: >60 mL/min (ref >60)
GFR, EST NON AFRICAN AMERICAN: 53 mL/min — AB (ref >60)
GLUCOSE: 109 mg/dL — AB (ref 65–99)
POTASSIUM: 3.7 mmol/L (ref 3.5–5.1)
Sodium: 141 mmol/L (ref 135–145)

## 2016-10-26 LAB — POCT I-STAT 3, ART BLOOD GAS (G3+)
Acid-Base Excess: 5 mmol/L — ABNORMAL HIGH (ref 0.0–2.0)
BICARBONATE: 30.3 mmol/L — AB (ref 20.0–28.0)
O2 Saturation: 97 %
PH ART: 7.436 (ref 7.350–7.450)
TCO2: 32 mmol/L (ref 22–32)
pCO2 arterial: 45 mmHg (ref 32.0–48.0)
pO2, Arterial: 92 mmHg (ref 83.0–108.0)

## 2016-10-26 LAB — PROTIME-INR
INR: 1.04
Prothrombin Time: 13.5 seconds (ref 11.4–15.2)

## 2016-10-26 LAB — CBC
HCT: 36 % (ref 36.0–46.0)
Hemoglobin: 11.6 g/dL — ABNORMAL LOW (ref 12.0–15.0)
MCH: 30.2 pg (ref 26.0–34.0)
MCHC: 32.2 g/dL (ref 30.0–36.0)
MCV: 93.8 fL (ref 78.0–100.0)
PLATELETS: 214 10*3/uL (ref 150–400)
RBC: 3.84 MIL/uL — AB (ref 3.87–5.11)
RDW: 15.7 % — ABNORMAL HIGH (ref 11.5–15.5)
WBC: 7.6 10*3/uL (ref 4.0–10.5)

## 2016-10-26 SURGERY — RIGHT/LEFT HEART CATH AND CORONARY ANGIOGRAPHY
Anesthesia: LOCAL

## 2016-10-26 MED ORDER — SODIUM CHLORIDE 0.9 % WEIGHT BASED INFUSION: 1.0000 mL/kg/h | INTRAVENOUS | Status: DC

## 2016-10-26 MED ORDER — SODIUM CHLORIDE 0.9 % WEIGHT BASED INFUSION
3.0000 mL/kg/h | INTRAVENOUS | Status: AC
  Administered 2016-10-26: 3 mL/kg/h via INTRAVENOUS

## 2016-10-26 MED ORDER — SODIUM CHLORIDE 0.9 % IV SOLN: 250.0000 mL | INTRAVENOUS | Status: DC | PRN

## 2016-10-26 MED ORDER — VERAPAMIL HCL 2.5 MG/ML IV SOLN
INTRAVENOUS | Status: DC | PRN
  Administered 2016-10-26: 14:00:00 via INTRA_ARTERIAL

## 2016-10-26 MED ORDER — LIDOCAINE HCL (PF) 1 % IJ SOLN
INTRAMUSCULAR | Status: AC
  Filled 2016-10-26: qty 30

## 2016-10-26 MED ORDER — IOPAMIDOL (ISOVUE-370) INJECTION 76%
INTRAVENOUS | Status: AC
  Filled 2016-10-26: qty 100

## 2016-10-26 MED ORDER — HEPARIN SODIUM (PORCINE) 1000 UNIT/ML IJ SOLN
INTRAMUSCULAR | Status: DC | PRN
  Administered 2016-10-26: 3000 [IU] via INTRAVENOUS

## 2016-10-26 MED ORDER — SODIUM CHLORIDE 0.9% FLUSH: 3.0000 mL | INTRAVENOUS | Status: DC | PRN

## 2016-10-26 MED ORDER — SODIUM CHLORIDE 0.9 % WEIGHT BASED INFUSION
1.0000 mL/kg/h | INTRAVENOUS | Status: DC
Start: 2016-10-26 — End: 2016-10-26

## 2016-10-26 MED ORDER — MIDAZOLAM HCL 2 MG/2ML IJ SOLN
INTRAMUSCULAR | Status: AC
  Filled 2016-10-26: qty 2

## 2016-10-26 MED ORDER — SODIUM CHLORIDE 0.9% FLUSH: 3.0000 mL | Freq: Two times a day (BID) | INTRAVENOUS | Status: DC

## 2016-10-26 MED ORDER — HEPARIN (PORCINE) IN NACL 2-0.9 UNIT/ML-% IJ SOLN
INTRAMUSCULAR | Status: AC | PRN
  Administered 2016-10-26: 1000 mL via INTRA_ARTERIAL

## 2016-10-26 MED ORDER — ONDANSETRON HCL 4 MG/2ML IJ SOLN: 4.0000 mg | Freq: Four times a day (QID) | INTRAMUSCULAR | Status: DC | PRN

## 2016-10-26 MED ORDER — ACETAMINOPHEN 325 MG PO TABS: 650.0000 mg | ORAL_TABLET | ORAL | Status: DC | PRN

## 2016-10-26 MED ORDER — HEPARIN SODIUM (PORCINE) 1000 UNIT/ML IJ SOLN
INTRAMUSCULAR | Status: AC
  Filled 2016-10-26: qty 1

## 2016-10-26 MED ORDER — ASPIRIN 81 MG PO CHEW: 81.0000 mg | CHEWABLE_TABLET | ORAL | Status: DC

## 2016-10-26 MED ORDER — VERAPAMIL HCL 2.5 MG/ML IV SOLN
INTRAVENOUS | Status: AC
  Filled 2016-10-26: qty 2

## 2016-10-26 MED ORDER — HEPARIN (PORCINE) IN NACL 2-0.9 UNIT/ML-% IJ SOLN
INTRAMUSCULAR | Status: AC
  Filled 2016-10-26: qty 1000

## 2016-10-26 MED ORDER — IOPAMIDOL (ISOVUE-370) INJECTION 76%
INTRAVENOUS | Status: DC | PRN
  Administered 2016-10-26: 50 mL via INTRA_ARTERIAL

## 2016-10-26 MED ORDER — MIDAZOLAM HCL 2 MG/2ML IJ SOLN
INTRAMUSCULAR | Status: DC | PRN
  Administered 2016-10-26: 0.5 mg via INTRAVENOUS

## 2016-10-26 MED ORDER — LIDOCAINE HCL (PF) 1 % IJ SOLN
INTRAMUSCULAR | Status: DC | PRN
  Administered 2016-10-26: 2 mL via INTRADERMAL

## 2016-10-26 SURGICAL SUPPLY — 17 items
CATH 5FR JL3.5 JR4 ANG PIG MP (CATHETERS) ×2 IMPLANT
CATH BALLN WEDGE 5F 110CM (CATHETERS) ×2 IMPLANT
CATH INFINITI 5FR AL1 (CATHETERS) ×2 IMPLANT
CATH INFINITI 5FR JL4 (CATHETERS) ×2 IMPLANT
DEVICE RAD COMP TR BAND LRG (VASCULAR PRODUCTS) ×2 IMPLANT
GLIDESHEATH SLEND SS 6F .021 (SHEATH) ×2 IMPLANT
GUIDEWIRE .025 260CM (WIRE) ×2 IMPLANT
GUIDEWIRE INQWIRE 1.5J.035X260 (WIRE) ×1 IMPLANT
INQWIRE 1.5J .035X260CM (WIRE) ×2
KIT HEART LEFT (KITS) ×2 IMPLANT
PACK CARDIAC CATHETERIZATION (CUSTOM PROCEDURE TRAY) ×2 IMPLANT
SHEATH GLIDE SLENDER 4/5FR (SHEATH) ×2 IMPLANT
TRANSDUCER W/STOPCOCK (MISCELLANEOUS) ×2 IMPLANT
TUBING CIL FLEX 10 FLL-RA (TUBING) ×2 IMPLANT
WIRE EMERALD 3MM-J .025X260CM (WIRE) ×2 IMPLANT
WIRE EMERALD ST .035X150CM (WIRE) ×2 IMPLANT
WIRE HI TORQ VERSACORE-J 145CM (WIRE) ×2 IMPLANT

## 2016-10-26 NOTE — H&P (View-Only) (Signed)
Cardiology Office Note Date:  09/30/2016   ID:  Oriya, [place] Jun 29, 1924, MRN [id number]  PCP:  Tegeler, Loma Sousa, MD  Cardiologist:  [name], MD    Chief Complaint  Patient presents with  . New Patient (Initial Visit)     History of Present Illness: [name] is a 83 y.o. female who presents for evaluation of severe symptomatic aortic stenosis.  The patient has been followed in [place] by Dr. Rockey Situ and Ignacia Bayley, NP. She's had multiple hospital evaluations for failure to thrive, malaise, anorexia, chronic pain, heart failure. She was most recently hospitalized in July with acute on chronic diastolic heart failure.  She is a mother of 11 children and over 34 grandchildren. She's been married twice and has been widowed since 2005. The patient is here with 2 of her daughters today. She was just discharged from a rehab facility last week. Her daughters state that her strength is improving. She has been able to walk in the whole without a walker or cane. Today she is in a wheelchair but the family states that is because we worked them in and they had to rush over here.   The patient is short of breath with activity. She denies orthopnea or PND. She denies chest pain, lightheadedness, or syncope. In review of all of her studies, she had an old stroke seen on CT scan, but the patient has not had a clinical stroke. She is currently living alone but one of her children are with her all of the time. She is able to get up on her own per their report and her activity level is increasing slowly.    Past Medical History:  Diagnosis Date  . Anemia   . Bone disease   . Carotid arterial disease ([place])    a. 08/2014 U/S: Bilateral < 50% stenosis. Patent vertebrals w/ antegrade flow.   . Cervical spine arthritis ([place])   . Chronic diastolic CHF (congestive heart failure) ([place])    a. echo 2014: EF 55-60%, DD;  b. 08/2014 Echo: EF 55-60%, no RWMA, GR1DD; c. 02/2016  Echo: EF 65-70%, Gr1 DD.  Marland Kitchen Cognitive impairment   . Depression   . Diabetes mellitus without complication ([place])   . Essential hypertension   . GERD (gastroesophageal reflux disease)   . Glaucoma   . Gout   . Hiatal hernia   . History of renal impairment   . Hyperlipidemia   . Hypotension    a. Related to Norvasc - 11/2014 ED visit.  . Multinodular goiter    a. Noted incidentally on CT 07/2012 and carotid U/S 08/2014;  b. Nl TSH 11/2014.  Marland Kitchen Paroxysmal SVT (supraventricular tachycardia) ([facility])    a. on Toprol   . Prolapsed uterus   . Severe aortic stenosis    a. echo 08/2014: EF 55-60%, no RWMA, GR1DD, severe AS, mild AI, Mean gradient (S): 38 mm Hg. Valve area (VTI): 0.78 cm^2., mild-mod MR, LA mildly dilated, atrial septal aneurysm, mild TR;  b. 02/2016 Echo: EF 65-70%, Gr1 DD, sev AS, mild to mod AI, Valve area (VTI): 0.94 cm^2, (Vmax) 0.96 cm^2, (Vmean) 0.93cm^2, mild MR, mildly dil LA, nl RV fxn, PASP 53mmHg; c. 03/2016 TEE: EF 55-60, mod AS.  Marland Kitchen Stroke [facility])    a. CT head 07/2014 showed small thalamic infarct  . Vitamin deficiency     Past Surgical History:  Procedure Laterality Date  . TEE WITHOUT CARDIOVERSION N/A 03/31/2016   Procedure: TRANSESOPHAGEAL ECHOCARDIOGRAM (  TEE);  Surgeon: Minna Merritts, MD;  Location: [facility] ORS;  Service: Cardiovascular;  Laterality: N/A;  . TUBAL LIGATION      Current Outpatient Prescriptions  Medication Sig Dispense Refill  . acetaminophen (TYLENOL) 500 MG tablet Take 500 mg by mouth every 6 (six) hours as needed for mild pain or headache.    . Amino Acids-Protein Hydrolys (FEEDING SUPPLEMENT, PRO-STAT SUGAR FREE 64,) LIQD Take 30 mLs by mouth 3 (three) times daily with meals.    Marland Kitchen aspirin 81 MG tablet Take 81 mg by mouth daily.    . brimonidine (ALPHAGAN P) 0.1 % SOLN Place 1 drop into the right eye 2 (two) times daily.    . Cholecalciferol (VITAMIN D) 2000 units CAPS Take 2,000 Units by mouth daily.     . Dorzolamide HCl-Timolol Mal (COSOPT OP)  Place 1 drop into the right eye 2 (two) times daily.     . feeding supplement, ENSURE ENLIVE, (ENSURE ENLIVE) LIQD Take 237 mLs by mouth 2 (two) times daily between meals. 237 mL 12  . furosemide (LASIX) 20 MG tablet Take 20 mg by mouth daily as needed (with potassium).    Marland Kitchen gabapentin (NEURONTIN) 300 MG capsule Take 300 mg by mouth 2 (two) times daily.    . hydrALAZINE (APRESOLINE) 50 MG tablet Take 50 mg by mouth 3 (three) times daily as needed (elevated BP).    Marland Kitchen HYDROcodone-acetaminophen (NORCO/VICODIN) 5-325 MG tablet Take 1 tablet by mouth every 6 (six) hours as needed for moderate pain. 30 tablet 0  . iron polysaccharides (NIFEREX) 150 MG capsule Take 150 mg by mouth 2 (two) times daily.    . isosorbide mononitrate (IMDUR) 30 MG 24 hr tablet Take 1 tablet (30 mg total) by mouth daily. 30 tablet 0  . latanoprost (XALATAN) 0.005 % ophthalmic solution Place 1 drop into the right eye at bedtime.  3  . lisinopril (PRINIVIL,ZESTRIL) 10 MG tablet Take 10 mg by mouth daily.    Marland Kitchen LORazepam (ATIVAN) 0.5 MG tablet Take 1 tablet (0.5 mg total) by mouth at bedtime. Take 1 tablet at bedtime as needed for sleep or anxiety 60 tablet 0  . lovastatin (MEVACOR) 40 MG tablet Take 40 mg by mouth at bedtime.    . metoprolol succinate (TOPROL-XL) 25 MG 24 hr tablet Take 25 mg by mouth daily.    . pantoprazole (PROTONIX) 40 MG tablet Take 40 mg by mouth daily.    . pilocarpine (PILOCAR) 4 % ophthalmic solution Place 1 drop into the right eye 4 (four) times daily.     Marland Kitchen tiZANidine (ZANAFLEX) 4 MG tablet Take 1 tablet by mouth 3 (three) times daily as needed for muscle spasms.   1  . traZODone (DESYREL) 100 MG tablet Take 100 mg by mouth at bedtime.    . vitamin C (ASCORBIC ACID) 250 MG tablet Take 250 mg by mouth daily.    . potassium chloride (K-DUR) 10 MEQ tablet Take 1 tablet (10 mEq total) by mouth daily. Take with lasix 30 tablet 6   No current facility-administered medications for this visit.      Allergies:   Metformin and related   Social History:  The patient  reports that she has never smoked. She has never used smokeless tobacco. She reports that she does not drink alcohol or use drugs.   Family History:  The patient's family history includes Colitis in her father; Diabetes in her mother; Glaucoma in her mother; Peptic Ulcer in her father.  ROS:  Please see the history of present illness.  All other systems are reviewed and negative.    PHYSICAL EXAM: VS:  BP (!) 138/40   Pulse 76   Ht 5' (1.524 m)   Wt 130 lb 12.8 oz (59.3 kg)   BMI 25.55 kg/m  , BMI Body mass index is 25.55 kg/m. GEN: Elderly frail-appearing woman in no acute distress  HEENT: normal  Neck: no JVD, no masses. No carotid bruits Cardiac: RRR with 3/6 harsh late peaking systolic murmur best heard at the right upper sternal border and grade 2/6 diastolic murmur at the left lower sternal border              Respiratory:  clear to auscultation bilaterally, normal work of breathing GI: soft, nontender, nondistended, + BS MS: no deformity or atrophy  Ext: no pretibial edema Skin: warm and dry, no rash Neuro:  Strength and sensation are intact Psych: euthymic mood, full affect  EKG:  EKG is not ordered today.  Recent Labs: 08/29/2016: Hemoglobin 10.3; Magnesium 1.3; Platelets 193 08/30/2016: ALT 20; TSH 0.689 09/02/2016: BUN 18; Creatinine, Ser 0.67; Potassium 3.8; Sodium 142 09/15/2016: B Natriuretic Peptide 313.0   Lipid Panel     Component Value Date/Time   CHOL 114 09/11/2014 0246   TRIG 97 09/11/2014 0246   HDL 40 (L) 09/11/2014 0246   CHOLHDL 2.9 09/11/2014 0246   VLDL 19 09/11/2014 0246   LDLCALC 55 09/11/2014 0246      Wt Readings from Last 3 Encounters:  09/30/16 130 lb 12.8 oz (59.3 kg)  09/14/16 127 lb 12 oz (57.9 kg)  09/02/16 140 lb 4 oz (63.6 kg)     Cardiac Studies Reviewed: 2D Echo 03/23/2016: Left ventricle:  The cavity size was at the upper limits of normal. Wall  thickness was increased in a pattern of moderate LVH. Systolic function was vigorous. The estimated ejection fraction was in the range of 65% to 70%. Wall motion was normal; there were no regional wall motion abnormalities. Doppler parameters are consistent with abnormal left ventricular relaxation (grade 1 diastolic dysfunction). Doppler parameters are consistent with high ventricular filling pressure. Global longitudinal strain is -21.8%.   ------------------------------------------------------------------- Aortic valve:   Trileaflet; severely thickened, moderately calcified leaflets. Cusp separation was reduced.  Doppler: Transvalvular velocity was increased. There was severe stenosis. There was mild to moderate regurgitation.    VTI ratio of LVOT to aortic valve: 0.33. Valve area (VTI): 0.94 cm^2. Indexed valve area (VTI): 0.56 cm^2/m^2. Peak velocity ratio of LVOT to aortic valve: 0.34. Valve area (Vmax): 0.96 cm^2. Indexed valve area (Vmax): 0.57 cm^2/m^2. Mean velocity ratio of LVOT to aortic valve: 0.33. Valve area (Vmean): 0.93 cm^2. Indexed valve area (Vmean): 0.55 cm^2/m^2.    Mean gradient (S): 60 mm Hg. Peak gradient (S): 97 mm Hg.  ------------------------------------------------------------------- Aorta:  Aortic root: The aortic root was normal in size. Ascending aorta: The ascending aorta was normal in size.  ------------------------------------------------------------------- Mitral valve:   Calcified annulus. Mildly thickened leaflets . Leaflet separation was normal.  Doppler:  Transvalvular velocity was within the normal range. There was no evidence for stenosis. There was mild regurgitation.    Peak gradient (D): 8 mm Hg.  ------------------------------------------------------------------- Left atrium:  The atrium was mildly dilated.  ------------------------------------------------------------------- Atrial septum:  The interatrial septum was hypermobile.  There was an atrial septal aneurysm.  ------------------------------------------------------------------- Right ventricle:  The cavity size was normal. Systolic function was normal.  ------------------------------------------------------------------- Pulmonic valve:    Structurally  normal valve.   Cusp separation was normal.  Doppler:  Transvalvular velocity was within the normal range. There was no regurgitation.  ------------------------------------------------------------------- Tricuspid valve:   Structurally normal valve.   Leaflet separation was normal.  Doppler:  Transvalvular velocity was within the normal range. There was mild regurgitation.  ------------------------------------------------------------------- Pulmonary artery:   Systolic pressure was severely increased.  ------------------------------------------------------------------- Right atrium:  The atrium was normal in size.  ------------------------------------------------------------------- Pericardium:  A trivial pericardial effusion was identified.  ------------------------------------------------------------------- Systemic veins: Inferior vena cava: The vessel was normal in size. The respirophasic diameter changes were in the normal range (>= 50%), consistent with normal central venous pressure.  ------------------------------------------------------------------- Measurements   Left ventricle                           Value          Reference  LV ID, ED, PLAX chordal                  45.5  mm       43 - 52  LV ID, ES, PLAX chordal                  25.2  mm       23 - 38  LV fx shortening, PLAX chordal           45    %        >=29  LV PW thickness, ED                      13.4  mm       ----------  IVS/LV PW ratio, ED                      1.19           <=1.3  Stroke volume, 2D                        101   ml       ----------  Stroke volume/bsa, 2D                    60    ml/m^2    ----------  LV ejection fraction, 1-p A4C            80    %        ----------  LV end-diastolic volume, 2-p             51    ml       ----------  LV end-systolic volume, 2-p              9     ml       ----------  LV ejection fraction, 2-p                82    %        ----------  Stroke volume, 2-p                       42    ml       ----------  LV end-diastolic volume/bsa, 2-p         30    ml/m^2   ----------  LV end-systolic volume/bsa, 2-p          5  ml/m^2   ----------  Stroke volume/bsa, 2-p                   24.7  ml/m^2   ----------  LV e&', lateral                           6.85  cm/s     ----------  LV E/e&', lateral                         20.15          ----------  LV e&', medial                            3.81  cm/s     ----------  LV E/e&', medial                          36.22          ----------  LV e&', average                           5.33  cm/s     ----------  LV E/e&', average                         25.89          ----------  Longitudinal strain, TDI                 22    %        ----------    Ventricular septum                       Value          Reference  IVS thickness, ED                        15.9  mm       ----------    LVOT                                     Value          Reference  LVOT ID, S                               19    mm       ----------  LVOT area                                2.84  cm^2     ----------  LVOT peak velocity, S                    167   cm/s     ----------  LVOT mean velocity, S                    121   cm/s     ----------  LVOT VTI, S  35.4  cm       ----------  LVOT peak gradient, S                    11    mm Hg    ----------    Aortic valve                             Value          Reference  Aortic valve peak velocity, S            493   cm/s     ----------  Aortic valve mean velocity, S            370   cm/s     ----------  Aortic valve VTI, S                      107   cm        ----------  Aortic mean gradient, S                  60    mm Hg    ----------  Aortic peak gradient, S                  97    mm Hg    ----------  VTI ratio, LVOT/AV                       0.33           ----------  Aortic valve area, VTI                   0.94  cm^2     ----------  Aortic valve area/bsa, VTI               0.56  cm^2/m^2 ----------  Velocity ratio, peak, LVOT/AV            0.34           ----------  Aortic valve area, peak velocity         0.96  cm^2     ----------  Aortic valve area/bsa, peak              0.57  cm^2/m^2 ----------  velocity  Velocity ratio, mean, LVOT/AV            0.33           ----------  Aortic valve area, mean velocity         0.93  cm^2     ----------  Aortic valve area/bsa, mean              0.55  cm^2/m^2 ----------  velocity  Aortic regurg pressure half-time         267   ms       ----------    Aorta                                    Value          Reference  Aortic root ID, ED                       30    mm       ----------  Ascending aorta ID, A-P,  S               34    mm       ----------    Left atrium                              Value          Reference  LA ID, A-P, ES                           39    mm       ----------  LA ID/bsa, A-P                    (H)    2.3   cm/m^2   <=2.2  LA volume, S                             58.2  ml       ----------  LA volume/bsa, S                         34.4  ml/m^2   ----------  LA volume, ES, 1-p A4C                   61.1  ml       ----------  LA volume/bsa, ES, 1-p A4C               36.1  ml/m^2   ----------  LA volume, ES, 1-p A2C                   50.9  ml       ----------  LA volume/bsa, ES, 1-p A2C               30.1  ml/m^2   ----------    Mitral valve                             Value          Reference  Mitral E-wave peak velocity              138   cm/s     ----------  Mitral A-wave peak velocity              197   cm/s     ----------  Mitral deceleration time          (L)    116   ms        150 - 230  Mitral peak gradient, D                  8     mm Hg    ----------  Mitral E/A ratio, peak                   0.7            ----------    Pulmonary arteries                       Value          Reference  PA pressure, S, DP                (  H)    75    mm Hg    <=30    Tricuspid valve                          Value          Reference  Tricuspid peak RV-RA gradient            53    mm Hg    ----------    Right atrium                             Value          Reference  RA ID, S-I, ES, A4C               (H)    53.9  mm       34 - 49  RA area, ES, A4C                         13.9  cm^2     8.3 - 19.5  RA volume, ES, A/L                       29.3  ml       ----------  RA volume/bsa, ES, A/L                   17.3  ml/m^2   ----------    Right ventricle                          Value          Reference  TAPSE                                    34.4  mm       ----------  RV s&', lateral, S                        11.7  cm/s     ----------    Pulmonic valve                           Value          Reference  Pulmonic valve peak velocity, S          143   cm/s     ----------  Pulmonic regurg velocity, ED             155   cm/s     ----------  Pulmonic regurg gradient, ED             10    mm Hg    ----------  Legend: (L)  and  (H)  mark values outside specified reference range.  STS Risk Calculator: Risk of Mortality: 6.689%  Morbidity or Mortality: 33.11%  Long Length of Stay: 21.961%  Short Length of Stay: 9.334%  Permanent Stroke: 6.015%  Prolonged Ventilation: 25.303%  DSW Infection: 0.117%  Renal Failure: 12.166%  Reoperation: 12.184%   ASSESSMENT AND PLAN: This is an 83 year old elderly woman with severe, stage DI aortic stenosis complicated by recurrent episodes of acute on chronic diastolic congestive heart failure.  I have reviewed  the natural history of aortic stenosis with the patient and her daughters are present today. We have discussed the limitations  of medical therapy and the poor prognosis associated with symptomatic aortic stenosis, especially when complicated with congestive heart failure. We have reviewed potential treatment options, including palliative medical therapy, conventional surgical aortic valve replacement, and transcatheter aortic valve replacement. We discussed treatment options in the context of this patient's specific comorbid medical conditions.   The patient's echo images are personally reviewed. There is severe concentric hypertrophy of the left ventricle. The LV systolic function is vigorous. The patient has severely calcified aortic valve leaflets with peak and mean gradients of 97 and 61 mmHg, respectively. There is mild to moderate aortic insufficiency present.  This patient is clearly not a candidate for conventional cardiac surgery considering her advanced age and severe functional limitation. While on paper the patient doesn't have any absolute contraindication to TAVR, I am pretty concerned about her degree of frailty. I had a frank discussion with the patient's family today that I feel she is pretty marginal to undergo the workup and TAVR surgery. They feel she is getting stronger and would like to do anything possible to help her longevity and quality of life. Rather then exposing her to the risk of cardiac catheterization and other pre-TAVR workup, I think it is best to have her undergo a formal physical therapy assessment and then cardiac surgical evaluation for a second opinion regarding her candidacy for TAVR. If she is not a candidate I would not want to expose her to the risks of CT scans and heart catheterization.   Current medicines are reviewed with the patient today.  The patient does not have concerns regarding medicines.  Labs/ tests ordered today include:  No orders of the defined types were placed in this encounter.  Disposition:   FU pending further evaluation  Signed, [name], MD  09/30/2016  5:24 PM    [facility] [place], [place], [place]  [address] Phone: [phone]; Fax: [phone]

## 2016-10-26 NOTE — Interval H&P Note (Signed)
History and Physical Interval Note:  10/26/2016 1:59 PM  [name]  has presented today for surgery, with the diagnosis of severe aortic stenosis  The various methods of treatment have been discussed with the patient and family. After consideration of risks, benefits and other options for treatment, the patient has consented to  Procedure(s): RIGHT/LEFT HEART CATH AND CORONARY ANGIOGRAPHY (N/A) as a surgical intervention .  The patient's history has been reviewed, patient examined, no change in status, stable for surgery.  I have reviewed the patient's chart and labs.  Questions were answered to the patient's satisfaction.    Pt has undergone cardiac surgical evaluation since the time of my office visit with plans to move forward with TAVR evaluation. Plans for R/L heart cath today.  [name]

## 2016-10-26 NOTE — Discharge Instructions (Signed)

## 2016-10-26 NOTE — Progress Notes (Signed)
On arrival to [facility] pt's rt hand fingers were blue and cold to the touch. TRB on rt wrist. 2 inch hematoma above TRB. Pressure held to hematoma x 5 minutes. Site soft/ swelling decreased. Air removed from TRB in 1 cc increments x 6 to improve color to rt hand. Cap refill now < 3 seconds, hand now warm to the touch. Rt brachial sheath removed. Pressure held x 5 minutes. Site unremarkable.

## 2016-10-27 ENCOUNTER — Encounter (HOSPITAL_COMMUNITY): Payer: Self-pay | Admitting: Cardiovascular Disease

## 2016-10-27 ENCOUNTER — Other Ambulatory Visit: Payer: Self-pay

## 2016-10-27 DIAGNOSIS — I350 Nonrheumatic aortic (valve) stenosis: Secondary | ICD-10-CM

## 2016-11-01 ENCOUNTER — Ambulatory Visit: Payer: Self-pay | Admitting: Cardiovascular Disease

## 2016-11-02 ENCOUNTER — Ambulatory Visit: Payer: Self-pay | Admitting: Cardiovascular Disease

## 2016-11-03 ENCOUNTER — Ambulatory Visit (HOSPITAL_COMMUNITY)
Admission: RE | Admit: 2016-11-03 | Discharge: 2016-11-03 | Disposition: A | Discharge status: Home / Self Care | Payer: Medicare HMO | Source: Ambulatory Visit | Attending: Cardiovascular Disease | Admitting: Cardiovascular Disease

## 2016-11-03 ENCOUNTER — Encounter (HOSPITAL_COMMUNITY): Payer: Self-pay | Admitting: Physician Assistant

## 2016-11-03 ENCOUNTER — Ambulatory Visit (HOSPITAL_BASED_OUTPATIENT_CLINIC_OR_DEPARTMENT_OTHER)
Admission: RE | Admit: 2016-11-03 | Discharge: 2016-11-03 | Disposition: A | Discharge status: Home / Self Care | Payer: Medicare HMO | Source: Ambulatory Visit | Attending: Cardiovascular Disease | Admitting: Cardiovascular Disease

## 2016-11-03 ENCOUNTER — Ambulatory Visit (HOSPITAL_COMMUNITY): Payer: Medicare HMO

## 2016-11-03 ENCOUNTER — Other Ambulatory Visit: Payer: Self-pay | Admitting: Cardiovascular Disease

## 2016-11-03 DIAGNOSIS — I878 Other specified disorders of veins: Secondary | ICD-10-CM | POA: Insufficient documentation

## 2016-11-03 DIAGNOSIS — I350 Nonrheumatic aortic (valve) stenosis: Secondary | ICD-10-CM

## 2016-11-03 DIAGNOSIS — J90 Pleural effusion, not elsewhere classified: Secondary | ICD-10-CM | POA: Insufficient documentation

## 2016-11-03 DIAGNOSIS — R918 Other nonspecific abnormal finding of lung field: Secondary | ICD-10-CM | POA: Diagnosis not present

## 2016-11-03 DIAGNOSIS — I6523 Occlusion and stenosis of bilateral carotid arteries: Secondary | ICD-10-CM | POA: Insufficient documentation

## 2016-11-03 HISTORY — PX: IR US GUIDE VASC ACCESS RIGHT: IMG2390

## 2016-11-03 HISTORY — PX: IR RADIOLOGY PERIPHERAL GUIDED IV START: IMG5598

## 2016-11-03 LAB — PULMONARY FUNCTION TEST
DL/VA % PRED: 110 %
DL/VA: 4.69 ml/min/mmHg/L
DLCO COR: 11.18 ml/min/mmHg
DLCO UNC % PRED: 55 %
DLCO cor % pred: 59 %
DLCO unc: 10.51 ml/min/mmHg
FEF 25-75 PRE: 0.78 L/s
FEF 25-75 Post: 1.13 L/sec
FEF2575-%CHANGE-POST: 44 %
FEF2575-%PRED-POST: 171 %
FEF2575-%PRED-PRE: 118 %
FEV1-%CHANGE-POST: 1 %
FEV1-%Pred-Post: 123 %
FEV1-%Pred-Pre: 122 %
FEV1-Post: 1.14 L
FEV1-Pre: 1.13 L
FEV1FVC-%CHANGE-POST: 1 %
FEV1FVC-%Pred-Pre: 112 %
FEV6-%Change-Post: -1 %
FEV6-%PRED-POST: 120 %
FEV6-%Pred-Pre: 122 %
FEV6-PRE: 1.37 L
FEV6-Post: 1.35 L
FEV6FVC-%PRED-PRE: 106 %
FEV6FVC-%Pred-Post: 106 %
FVC-%Change-Post: 0 %
FVC-%PRED-POST: 115 %
FVC-%Pred-Pre: 115 %
FVC-PRE: 1.37 L
FVC-Post: 1.37 L
POST FEV1/FVC RATIO: 83 %
PRE FEV6/FVC RATIO: 100 %
Post FEV6/FVC ratio: 100 %
Pre FEV1/FVC ratio: 82 %
RV % PRED: 99 %
RV: 2.38 L
TLC % pred: 85 %
TLC: 3.81 L

## 2016-11-03 LAB — VAS US CAROTID
LEFT VERTEBRAL DIAS: 6 cm/s
LICAPDIAS: -8 cm/s
LICAPSYS: -40 cm/s
Left CCA dist sys: 59 cm/s
Left CCA prox sys: 86 cm/s
RCCAPSYS: 41 cm/s

## 2016-11-03 MED ORDER — LIDOCAINE HCL (PF) 1 % IJ SOLN
INTRAMUSCULAR | Status: AC
  Filled 2016-11-03: qty 30

## 2016-11-03 MED ORDER — ALBUTEROL SULFATE (2.5 MG/3ML) 0.083% IN NEBU
2.5000 mg | INHALATION_SOLUTION | Freq: Once | RESPIRATORY_TRACT | Status: AC
  Administered 2016-11-03: 2.5 mg via RESPIRATORY_TRACT

## 2016-11-03 MED ORDER — IOPAMIDOL (ISOVUE-370) INJECTION 76%
INTRAVENOUS | Status: AC
  Administered 2016-11-03: 100 mL
  Filled 2016-11-03: qty 100

## 2016-11-03 NOTE — Progress Notes (Signed)
*  PRELIMINARY RESULTS* Vascular Ultrasound Carotid Duplex (Doppler) has been completed.   Study was very technically difficult due to patient anatomy, vessel tortuosity, and patient's inability to position properly. Findings suggest 1-39% internal carotid artery stenosis involving bilateral visualized segments. There is a tortuous vessel that appears to potentially course from the right proximal common carotid artery to the proximal right internal carotid artery; etiology is unknown. Bilateral vertebral arteries are patent with antegrade flow.  11/03/2016 10:51 AM Maudry Mayhew, BS, RVT, RDCS, RDMS

## 2016-11-03 NOTE — Progress Notes (Signed)
  Successful placement of right basilic vein IV.  No complications Ready for use.  Gentry Seeber S Florrie Ramires PA-C

## 2016-11-04 ENCOUNTER — Encounter (HOSPITAL_COMMUNITY): Payer: Self-pay | Admitting: Physician Assistant

## 2016-11-11 ENCOUNTER — Other Ambulatory Visit: Payer: Self-pay

## 2016-11-11 ENCOUNTER — Institutional Professional Consult (permissible substitution) (INDEPENDENT_AMBULATORY_CARE_PROVIDER_SITE_OTHER): Payer: Medicare HMO | Admitting: Thoracic Surgery (Cardiothoracic Vascular Surgery)

## 2016-11-11 ENCOUNTER — Encounter: Payer: Self-pay | Admitting: Thoracic Surgery (Cardiothoracic Vascular Surgery)

## 2016-11-11 VITALS — BP 144/51 | HR 72 | Resp 16 | Ht 60.0 in | Wt 128.0 lb

## 2016-11-11 DIAGNOSIS — I350 Nonrheumatic aortic (valve) stenosis: Secondary | ICD-10-CM

## 2016-11-11 NOTE — Progress Notes (Signed)
[facility]  CARDIOTHORACIC SURGERY CONSULTATION REPORT  Referring Provider is Ida Rogue, MD PCP is Tegeler, Loma Sousa, MD  Chief Complaint  Patient presents with  . Aortic Stenosis    2nd TAVR eval to review all studies and schedule surgery if appropriate    HPI:  Patient is a 83 year old African-American female with history of aortic stenosis and aortic insufficiency, chronic diastolic congestive heart failure, SVT, anemia, diabetes mellitus without complication, hypertension, degenerative arthritis, and glaucoma who has been referred for second surgical opinion to discuss treatment options for management of severe symptomatic aortic stenosis.  Despite the patient's advanced age and numerous medical problems she has reportedly remained functionally independent and recently active until recently. She was hospitalized in 2016 with supraventricular tachycardia. Echocardiogram performed at that time revealed a left ventricular function with moderate aortic stenosis. She has been followed intermittently ever since by Dr. Rockey Situ. In January 2018 the patient was hospitalized with Enterococcus sepsis complicated by congestive heart failure. Transthoracic echocardiogram performed at that time revealed normal left ventricular systolic function with severe aortic stenosis. Peak velocity across the aortic valve was reported 4.9 m/s corresponding to mean transvalvular gradient estimated 60 mmHg. Source for sepsis was never identified but transesophageal echocardiogram performed at that time revealed no vegetations to suggest the presence of bacterial endocarditis.  Transesophageal echocardiogram confirmed the presence of severely calcified leaflets of the aortic valve, with aortic valve area calculated 1.347 m. There was moderate aortic insufficiency. The patient's sepsis resolved with antibiotics, but over the past 6-8 months the patient has  continued to have problems with progressive fatigue, exertional shortness of breath, and peripheral edema. She has been hospitalized on several occasions with acute exacerbations of congestive heart failure, each time responding to diuretic therapy. She was referred to the heart valve clinic and the patient underwent a formal physical therapy evaluation to evaluate her frailty and actually did reasonably well.  She subsequently underwent left and right heart catheterization by Dr. Burt Knack on 10/26/2016 and was found to have moderate nonobstructive coronary artery disease with exception of 80% stenosis of the proximal posterior descending coronary artery. There was moderate to severe pulmonary hypertension with PA pressures measured 74/21 and a mean pulmonary capillary wedge pressure of 23 mmHg. Mean transvalvular gradient across the aortic valve was measured 17 mmHg corresponding to aortic valve area calculated 1.0 cm, although measurement of the gradient was difficult due to arrhythmias.  The patient was referred for surgical consultation and has been evaluated previously by Dr. Cyndia Bent who noted that the patient would not be considered candidate for conventional surgical aortic valve replacement under any circumstances. Transcatheter aortic valve replacement has been discussed as an alternative therapy to long-term palliative medical care, and CT angiography has been performed. The patient returns to the office today for a second surgical opinion.  The patient is widowed and lives alone. She has 11 adult children who live close by and are very supportive. She is accompanied by her youngest daughter for her office consultation today.  At present the patient reports that she just can't do like she used to. She denies any shortness of breath although her daughter notes that she gets dyspneic with low-level activity. She has never had any chest pain or chest tightness. She has not had any dizzy spells or syncope. She  has had intermittent episodes of severe resting and exertional shortness of breath, each time responding to adjustments in diuretic therapy. Appetite is stable. She  has not been losing weight. She remains ambulatory.  Both the patient and her daughter deny any history of confusion, delirium, or memory loss.  Past Medical History:  Diagnosis Date  . Anemia   . Bone disease   . Carotid arterial disease ([place])    a. 08/2014 U/S: Bilateral < 50% stenosis. Patent vertebrals w/ antegrade flow.   . Cervical spine arthritis ([place])   . Chronic diastolic CHF (congestive heart failure) ([place])    a. echo 2014: EF 55-60%, DD;  b. 08/2014 Echo: EF 55-60%, no RWMA, GR1DD; c. 02/2016 Echo: EF 65-70%, Gr1 DD.  Marland Kitchen Cognitive impairment   . Depression   . Diabetes mellitus without complication ([place])   . Essential hypertension   . GERD (gastroesophageal reflux disease)   . Glaucoma   . Gout   . Hiatal hernia   . History of renal impairment   . Hyperlipidemia   . Hypotension    a. Related to Norvasc - 11/2014 ED visit.  . Multinodular goiter    a. Noted incidentally on CT 07/2012 and carotid U/S 08/2014;  b. Nl TSH 11/2014.  Marland Kitchen Paroxysmal SVT (supraventricular tachycardia) ([facility])    a. on Toprol   . Prolapsed uterus   . Severe aortic stenosis    a. echo 08/2014: EF 55-60%, no RWMA, GR1DD, severe AS, mild AI, Mean gradient (S): 38 mm Hg. Valve area (VTI): 0.78 cm^2., mild-mod MR, LA mildly dilated, atrial septal aneurysm, mild TR;  b. 02/2016 Echo: EF 65-70%, Gr1 DD, sev AS, mild to mod AI, Valve area (VTI): 0.94 cm^2, (Vmax) 0.96 cm^2, (Vmean) 0.93cm^2, mild MR, mildly dil LA, nl RV fxn, PASP 7mmHg; c. 03/2016 TEE: EF 55-60, mod AS.  Marland Kitchen Stroke [facility])    a. CT head 07/2014 showed small thalamic infarct  . Vitamin deficiency     Past Surgical History:  Procedure Laterality Date  . IR RADIOLOGY PERIPHERAL GUIDED IV START  11/03/2016  . IR US GUIDE VASC ACCESS RIGHT  11/03/2016  . RIGHT/LEFT HEART CATH AND CORONARY  ANGIOGRAPHY N/A 10/26/2016   Procedure: RIGHT/LEFT HEART CATH AND CORONARY ANGIOGRAPHY;  Surgeon: [name], MD;  Location: [facility];  Service: Cardiovascular;  Laterality: N/A;  . TEE WITHOUT CARDIOVERSION N/A 03/31/2016   Procedure: TRANSESOPHAGEAL ECHOCARDIOGRAM (TEE);  Surgeon: Minna Merritts, MD;  Location: [facility] OR[facility];  Service: Cardiovascular;  Laterality: N/A;  . TUBAL LIGATION      Family History  Problem Relation Age of Onset  . Glaucoma Mother   . Diabetes Mother   . Peptic Ulcer Father   . Colitis Father     Social History   Social History  . Marital status: Widowed    Spouse name: N/A  . Number of children: N/A  . Years of education: N/A   Occupational History  . Not on file.   Social History Main Topics  . Smoking status: Never Smoker  . Smokeless tobacco: Never Used  . Alcohol use No  . Drug use: No  . Sexual activity: Not on file   Other Topics Concern  . Not on file   Social History Narrative  . No narrative on file    Current Outpatient Prescriptions  Medication Sig Dispense Refill  . acetaminophen (TYLENOL) 500 MG tablet Take 500 mg by mouth every 6 (six) hours as needed for mild pain or headache.    . allopurinol (ZYLOPRIM) 100 MG tablet Take 100 mg by mouth daily.    . Amino Acids-Protein Hydrolys (  FEEDING SUPPLEMENT, PRO-STAT SUGAR FREE 64,) LIQD Take 30 mLs by mouth 3 (three) times daily with meals.    Marland Kitchen aspirin 81 MG chewable tablet Chew 81 mg by mouth daily.    . brimonidine (ALPHAGAN P) 0.1 % SOLN Place 1 drop into the right eye 2 (two) times daily.    . cholecalciferol (VITAMIN D) 1000 units tablet Take 1,000 Units by mouth daily.    . Dorzolamide HCl-Timolol Mal (COSOPT OP) Place 1 drop into the right eye 2 (two) times daily.     . feeding supplement, ENSURE ENLIVE, (ENSURE ENLIVE) LIQD Take 237 mLs by mouth 2 (two) times daily between meals. 237 mL 12  . furosemide (LASIX) 20 MG tablet Take 20-40 mg by mouth daily as needed  (for fluid retention (takes with potassium)).     Marland Kitchen gabapentin (NEURONTIN) 300 MG capsule Take 300 mg by mouth 2 (two) times daily.    . hydrALAZINE (APRESOLINE) 50 MG tablet Take 50 mg by mouth 3 (three) times daily as needed (elevated BP).    Marland Kitchen HYDROcodone-acetaminophen (NORCO/VICODIN) 5-325 MG tablet Take 1 tablet by mouth every 6 (six) hours as needed for moderate pain. 30 tablet 0  . iron polysaccharides (NIFEREX) 150 MG capsule Take 150 mg by mouth 2 (two) times daily.    . isosorbide mononitrate (IMDUR) 30 MG 24 hr tablet Take 1 tablet (30 mg total) by mouth daily. 30 tablet 0  . latanoprost (XALATAN) 0.005 % ophthalmic solution Place 1 drop into the right eye at bedtime.  3  . lidocaine (LIDODERM) 5 % Place 1 patch onto the skin daily as needed (for pain.). Remove & Discard patch within 12 hours or as directed by MD    . lisinopril (PRINIVIL,ZESTRIL) 10 MG tablet Take 10 mg by mouth daily. HOLD FOR BLOOD PRESSURE LOWER THAN 90/60    . LORazepam (ATIVAN) 0.5 MG tablet Take 1 tablet (0.5 mg total) by mouth at bedtime. Take 1 tablet at bedtime as needed for sleep or anxiety (Patient taking differently: Take 0.5 mg by mouth 2 (two) times daily as needed (for sleep or anxiety). ) 60 tablet 0  . lovastatin (MEVACOR) 40 MG tablet Take 40 mg by mouth at bedtime.    . metoprolol succinate (TOPROL-XL) 25 MG 24 hr tablet Take 25 mg by mouth daily.    . pantoprazole (PROTONIX) 40 MG tablet Take 40 mg by mouth daily.    . pilocarpine (PILOCAR) 4 % ophthalmic solution Place 1 drop into the right eye 4 (four) times daily.     . potassium chloride (K-DUR) 10 MEQ tablet Take 1 tablet (10 mEq total) by mouth daily. Take with lasix (Patient taking differently: Take 10 mEq by mouth daily as needed (for fluid retention (takes with furosemide)). ) 30 tablet 6  . senna-docusate (SENOKOT-S) 8.6-50 MG tablet Take 2 tablets by mouth every other day.    . sertraline (ZOLOFT) 50 MG tablet Take 50 mg by mouth daily.      Marland Kitchen tiZANidine (ZANAFLEX) 4 MG tablet Take 1 tablet by mouth 3 (three) times daily as needed for muscle spasms.   1  . traZODone (DESYREL) 100 MG tablet Take 100 mg by mouth at bedtime.    . vitamin C (ASCORBIC ACID) 500 MG tablet Take 500 mg by mouth daily.     No current facility-administered medications for this visit.     Allergies  Allergen Reactions  . Metformin And Related Diarrhea      Review  of Systems:   General:  normal appetite, decreased energy, no weight gain, no weight loss, no fever  Cardiac:  no chest pain with exertion, no chest pain at rest, + SOB with exertion, no resting SOB, no PND, no orthopnea, no palpitations, no arrhythmia, no atrial fibrillation, + LE edema, no dizzy spells, no syncope  Respiratory:  + shortness of breath, no home oxygen, no productive cough, + dry cough, no bronchitis, no wheezing, no hemoptysis, no asthma, no pain with inspiration or cough, no sleep apnea, no CPAP at night  GI:   no difficulty swallowing, no reflux, no frequent heartburn, + hiatal hernia, no abdominal pain, + constipation, no diarrhea, no hematochezia, no hematemesis, no melena  GU:   no dysuria,  no frequency, no urinary tract infection, no hematuria, no kidney stones, no kidney disease  Vascular:  no pain suggestive of claudication, no pain in feet, no leg cramps, no varicose veins, no DVT, no non-healing foot ulcer  Neuro:   + "mini" stroke, no TIA's, no seizures, no headaches, no temporary blindness one eye,  no slurred speech, no peripheral neuropathy, no chronic pain, no instability of gait, no memory/cognitive dysfunction  Musculoskeletal: + arthritis, no joint swelling, no myalgias, no difficulty walking, normal mobility   Skin:   no rash, no itching, no skin infections, no pressure sores or ulcerations  Psych:   no anxiety, no depression, no nervousness, no unusual recent stress  Eyes:   no blurry vision, no floaters, no recent vision changes, + wears glasses or  contacts  ENT:   no hearing loss, no loose or painful teeth, + dentures  Hematologic:  no easy bruising, no abnormal bleeding, no clotting disorder, no frequent epistaxis  Endocrine:  + diabetes, does check CBG's at home           Physical Exam:   BP (!) 144/51 (BP Location: Right Arm, Patient Position: Sitting, Cuff Size: Large)   Pulse 72   Resp 16   Ht 5' (1.524 m)   Wt 128 lb (58.1 kg)   SpO2 98% Comment: ON RA  BMI 25.00 kg/m   General:  Thin, elderly and quite frail-appearing  HEENT:  Unremarkable   Neck:   no JVD, no bruits, no adenopathy   Chest:   clear to auscultation, symmetrical breath sounds, no wheezes, no rhonchi   CV:   RRR, grade IV/VI crescendo/decrescendo murmur heard best at RUSB, loud to and fro diastolic murmur  Abdomen:  soft, non-tender, no masses   Extremities:  warm, well-perfused, pulses palpable, no LE edema  Rectal/GU  Deferred  Neuro:   Grossly non-focal and symmetrical throughout  Skin:   Clean and dry, no rashes, no breakdown   Diagnostic Tests:  Transthoracic Echocardiography  Patient:    [place], [place] MR #:       [id number] Study Date: 03/23/2016 Gender:     F Age:        24 Height:     152.4 cm Weight:     65.9 kg BSA:        1.69 m^2 Pt. Status: Room:   ATTENDING    Default, Provider [phone]  SONOGRAPHER  [address] RVT, RDCS  REFERRING    Esmond Plants, MD, [facility]  ORDERING     Gollan, Tim  REFERRING    Gollan, Tim  PERFORMING   [facility], [facility]  cc:  ------------------------------------------------------------------- LV EF: 65% -   70%  ------------------------------------------------------------------- History:   PMH:  Acquired from  the patient and from the patient&'s chart.  Fatigue, weakness, dyspnea, and bilateral lower extremity edema.  Risk factors:  Lifelong nonsmoker. Hypertension. Diabetes mellitus. Dyslipidemia.  ------------------------------------------------------------------- Study  Conclusions  - Left ventricle: The cavity size was at the upper limits of   normal. Wall thickness was increased in a pattern of moderate   LVH. Systolic function was vigorous. The estimated ejection   fraction was in the range of 65% to 70%. Wall motion was normal;   there were no regional wall motion abnormalities. Doppler   parameters are consistent with abnormal left ventricular   relaxation (grade 1 diastolic dysfunction). Doppler parameters   are consistent with high ventricular filling pressure. - Aortic valve: Trileaflet; severely thickened, moderately   calcified leaflets. Transvalvular velocity was increased. There   was severe stenosis. There was mild to moderate regurgitation.   Valve area (VTI): 0.94 cm^2. Valve area (Vmax): 0.96 cm^2. Valve   area (Vmean): 0.93 cm^2. - Mitral valve: Calcified annulus. Mildly thickened leaflets .   There was mild regurgitation. - Left atrium: The atrium was mildly dilated. - Right ventricle: The cavity size was normal. Systolic function   was normal. - Atrial septum: There was an atrial septal aneurysm. - Pulmonary arteries: Systolic pressure was severely increased. PA   peak pressure: 75 mm Hg (S). - Pericardium, extracardiac: A trivial pericardial effusion was   identified.  ------------------------------------------------------------------- Study data:  The previous study was not available, so comparison was made to the report of 09/11/2014.  Study status:  Routine. Procedure:  Transthoracic echocardiography. Image quality was good.  Study completion:  There were no complications. Transthoracic echocardiography.  M-mode, complete 2D, spectral Doppler, and color Doppler.  Birthdate:  Patient birthdate: September 20, 1924.  Age:  Patient is 83 yr old.  Sex:  Gender: female. BMI: 28.4 kg/m^2.  Blood pressure:     140/82  Patient status: Outpatient.  Study date:  Study date: 03/23/2016. Study time:  11:40 AM.  -------------------------------------------------------------------  ------------------------------------------------------------------- Left ventricle:  The cavity size was at the upper limits of normal. Wall thickness was increased in a pattern of moderate LVH. Systolic function was vigorous. The estimated ejection fraction was in the range of 65% to 70%. Wall motion was normal; there were no regional wall motion abnormalities. Doppler parameters are consistent with abnormal left ventricular relaxation (grade 1 diastolic dysfunction). Doppler parameters are consistent with high ventricular filling pressure. Global longitudinal strain is -21.8%.   ------------------------------------------------------------------- Aortic valve:   Trileaflet; severely thickened, moderately calcified leaflets. Cusp separation was reduced.  Doppler: Transvalvular velocity was increased. There was severe stenosis. There was mild to moderate regurgitation.    VTI ratio of LVOT to aortic valve: 0.33. Valve area (VTI): 0.94 cm^2. Indexed valve area (VTI): 0.56 cm^2/m^2. Peak velocity ratio of LVOT to aortic valve: 0.34. Valve area (Vmax): 0.96 cm^2. Indexed valve area (Vmax): 0.57 cm^2/m^2. Mean velocity ratio of LVOT to aortic valve: 0.33. Valve area (Vmean): 0.93 cm^2. Indexed valve area (Vmean): 0.55 cm^2/m^2.    Mean gradient (S): 60 mm Hg. Peak gradient (S): 97 mm Hg.  ------------------------------------------------------------------- Aorta:  Aortic root: The aortic root was normal in size. Ascending aorta: The ascending aorta was normal in size.  ------------------------------------------------------------------- Mitral valve:   Calcified annulus. Mildly thickened leaflets . Leaflet separation was normal.  Doppler:  Transvalvular velocity was within the normal range. There was no evidence for stenosis. There was mild regurgitation.    Peak gradient (D): 8 mm  Hg.  ------------------------------------------------------------------- Left atrium:  The atrium was mildly dilated.  ------------------------------------------------------------------- Atrial septum:  The interatrial septum was hypermobile. There was an atrial septal aneurysm.  ------------------------------------------------------------------- Right ventricle:  The cavity size was normal. Systolic function was normal.  ------------------------------------------------------------------- Pulmonic valve:    Structurally normal valve.   Cusp separation was normal.  Doppler:  Transvalvular velocity was within the normal range. There was no regurgitation.  ------------------------------------------------------------------- Tricuspid valve:   Structurally normal valve.   Leaflet separation was normal.  Doppler:  Transvalvular velocity was within the normal range. There was mild regurgitation.  ------------------------------------------------------------------- Pulmonary artery:   Systolic pressure was severely increased.  ------------------------------------------------------------------- Right atrium:  The atrium was normal in size.  ------------------------------------------------------------------- Pericardium:  A trivial pericardial effusion was identified.  ------------------------------------------------------------------- Systemic veins: Inferior vena cava: The vessel was normal in size. The respirophasic diameter changes were in the normal range (>= 50%), consistent with normal central venous pressure.  ------------------------------------------------------------------- Measurements   Left ventricle                           Value          Reference  LV ID, ED, PLAX chordal                  45.5  mm       43 - 52  LV ID, ES, PLAX chordal                  25.2  mm       23 - 38  LV fx shortening, PLAX chordal           45    %        >=29  LV PW thickness,  ED                      13.4  mm       ----------  IVS/LV PW ratio, ED                      1.19           <=1.3  Stroke volume, 2D                        101   ml       ----------  Stroke volume/bsa, 2D                    60    ml/m^2   ----------  LV ejection fraction, 1-p A4C            80    %        ----------  LV end-diastolic volume, 2-p             51    ml       ----------  LV end-systolic volume, 2-p              9     ml       ----------  LV ejection fraction, 2-p                82    %        ----------  Stroke volume, 2-p                       42    ml       ----------  LV end-diastolic volume/bsa, 2-p         30    ml/m^2   ----------  LV end-systolic volume/bsa, 2-p          5     ml/m^2   ----------  Stroke volume/bsa, 2-p                   24.7  ml/m^2   ----------  LV e&', lateral                           6.85  cm/s     ----------  LV E/e&', lateral                         20.15          ----------  LV e&', medial                            3.81  cm/s     ----------  LV E/e&', medial                          36.22          ----------  LV e&', average                           5.33  cm/s     ----------  LV E/e&', average                         25.89          ----------  Longitudinal strain, TDI                 22    %        ----------    Ventricular septum                       Value          Reference  IVS thickness, ED                        15.9  mm       ----------    LVOT                                     Value          Reference  LVOT ID, S                               19    mm       ----------  LVOT area                                2.84  cm^2     ----------  LVOT peak velocity, S                    167   cm/s     ----------  LVOT mean velocity, S  121   cm/s     ----------  LVOT VTI, S                              35.4  cm       ----------  LVOT peak gradient, S                    11    mm Hg    ----------    Aortic valve                              Value          Reference  Aortic valve peak velocity, S            493   cm/s     ----------  Aortic valve mean velocity, S            370   cm/s     ----------  Aortic valve VTI, S                      107   cm       ----------  Aortic mean gradient, S                  60    mm Hg    ----------  Aortic peak gradient, S                  97    mm Hg    ----------  VTI ratio, LVOT/AV                       0.33           ----------  Aortic valve area, VTI                   0.94  cm^2     ----------  Aortic valve area/bsa, VTI               0.56  cm^2/m^2 ----------  Velocity ratio, peak, LVOT/AV            0.34           ----------  Aortic valve area, peak velocity         0.96  cm^2     ----------  Aortic valve area/bsa, peak              0.57  cm^2/m^2 ----------  velocity  Velocity ratio, mean, LVOT/AV            0.33           ----------  Aortic valve area, mean velocity         0.93  cm^2     ----------  Aortic valve area/bsa, mean              0.55  cm^2/m^2 ----------  velocity  Aortic regurg pressure half-time         267   ms       ----------    Aorta                                    Value          Reference  Aortic  root ID, ED                       30    mm       ----------  Ascending aorta ID, A-P, S               34    mm       ----------    Left atrium                              Value          Reference  LA ID, A-P, ES                           39    mm       ----------  LA ID/bsa, A-P                    (H)    2.3   cm/m^2   <=2.2  LA volume, S                             58.2  ml       ----------  LA volume/bsa, S                         34.4  ml/m^2   ----------  LA volume, ES, 1-p A4C                   61.1  ml       ----------  LA volume/bsa, ES, 1-p A4C               36.1  ml/m^2   ----------  LA volume, ES, 1-p A2C                   50.9  ml       ----------  LA volume/bsa, ES, 1-p A2C               30.1  ml/m^2   ----------    Mitral valve                              Value          Reference  Mitral E-wave peak velocity              138   cm/s     ----------  Mitral A-wave peak velocity              197   cm/s     ----------  Mitral deceleration time          (L)    116   ms       150 - 230  Mitral peak gradient, D                  8     mm Hg    ----------  Mitral E/A ratio, peak                   0.7            ----------    Pulmonary arteries  Value          Reference  PA pressure, S, DP                (H)    75    mm Hg    <=30    Tricuspid valve                          Value          Reference  Tricuspid peak RV-RA gradient            53    mm Hg    ----------    Right atrium                             Value          Reference  RA ID, S-I, ES, A4C               (H)    53.9  mm       34 - 49  RA area, ES, A4C                         13.9  cm^2     8.3 - 19.5  RA volume, ES, A/L                       29.3  ml       ----------  RA volume/bsa, ES, A/L                   17.3  ml/m^2   ----------    Right ventricle                          Value          Reference  TAPSE                                    34.4  mm       ----------  RV s&', lateral, S                        11.7  cm/s     ----------    Pulmonic valve                           Value          Reference  Pulmonic valve peak velocity, S          143   cm/s     ----------  Pulmonic regurg velocity, ED             155   cm/s     ----------  Pulmonic regurg gradient, ED             10    mm Hg    ----------  Legend: (L)  and  (H)  mark values outside specified reference range.  ------------------------------------------------------------------- Prepared and Electronically Authenticated by  Nelva Bush, MD 2018-01-24T16:36:10   Transesophageal Echocardiography  Patient:    Salam, [place] MR #:       [id number] Study Date: 03/31/2016 Gender:     F Age:  89 Height: Weight: BSA: Pt. Status: Room:   SONOGRAPHER  [facility]  ATTENDING    Gladstone Lighter  Ronaldo Miyamoto, Radhika  REFERRING    [place], [place]  PERFORMING   [facility]  cc:  ------------------------------------------------------------------- LV EF: 55% -   60%  ------------------------------------------------------------------- Indications:      Bacteremia 790.7. Severe aortic valve stenosis.   ------------------------------------------------------------------- Study Conclusions  - Left ventricle: Systolic function was normal. The estimated   ejection fraction was in the range of 55% to 60%. Wall motion was   normal; there were no regional wall motion abnormalities. - Aortic valve: Trileaflet; severely calcified leaflets. Estimated   AVA of 1.34 cm sq There was moderate stenosis. There was mild to   moderate regurgitation directed eccentrically in the LVOT. - Aorta: There was mild to moderate diffuse atheroma. - Left atrium: No evidence of thrombus in the atrial cavity or   appendage. - Right atrium: No evidence of thrombus in the atrial cavity or   appendage. - Atrial septum: There was a patent foramen ovale. Doppler shunt   from left to right noted also positive saline contrst bubble   study at rest (valsalva not performed). - Tricuspid valve: There was moderate regurgitation. - Pulmonary arteries: PA peak pressure: 60 mm Hg (S).  Impressions:  - No evidence of endocarditis. The right ventricular systolic   pressure was increased consistent with moderate pulmonary   hypertension.  ------------------------------------------------------------------- Study data:   Procedure:  Initial setup. The patient was brought to the laboratory. Surface ECG leads were monitored. Sedation. Conscious sedation was administered. Transesophageal echocardiography. Topical anesthesia was obtained using viscous lidocaine. A transesophageal probe was inserted by the attending cardiologist. Image quality was adequate.   Study completion:  The patient tolerated the procedure well. There were no complications.         Diagnostic transesophageal echocardiography.  2D and color Doppler.  Birthdate:  Patient birthdate: 10-04-1924.  Age:  Patient is 83 yr old.  Sex:  Gender: female.  Study date:  Study date: 03/31/2016. Study time: 09:08 AM.  -------------------------------------------------------------------  ------------------------------------------------------------------- Left ventricle:  Systolic function was normal. The estimated ejection fraction was in the range of 55% to 60%. Wall motion was normal; there were no regional wall motion abnormalities.  ------------------------------------------------------------------- Aortic valve:  Trileaflet; severely calcified leaflets. Estimated AVA of 1.34 cm sq Cusp separation was normal.  Doppler:   There was moderate stenosis.   There was mild to moderate regurgitation directed eccentrically in the LVOT.  ------------------------------------------------------------------- Aorta:  There was moderate diffuse atheroma. There was no evidence for dissection. Aortic root: The aortic root was not dilated. Ascending aorta: The ascending aorta was normal in size. Aortic arch: The aortic arch was normal in size. Descending aorta: The descending aorta was normal in size.  ------------------------------------------------------------------- Mitral valve:   Structurally normal valve.   Leaflet separation was normal.  Doppler:  There was trivial regurgitation.  ------------------------------------------------------------------- Left atrium:  The atrium was normal in size.  No evidence of thrombus in the atrial cavity or appendage. The appendage was morphologically a left appendage, multilobulated, and of normal size. Emptying velocity was normal.  ------------------------------------------------------------------- Atrial septum:  Well visualized. There was a  patent foramen ovale. Doppler shunt from left to right noted also positive saline contrst bubble study at rest (valsalva not performed).  ------------------------------------------------------------------- Right ventricle:  The cavity size was normal. Wall thickness was normal. Systolic function was normal.  ------------------------------------------------------------------- Pulmonic valve:  Structurally normal valve.    Doppler:  There was trivial regurgitation.  ------------------------------------------------------------------- Tricuspid valve:   Structurally normal valve.   Leaflet separation was normal.  Doppler:  There was moderate regurgitation.  ------------------------------------------------------------------- Pulmonary artery:   The main pulmonary artery was mildly dilated.   ------------------------------------------------------------------- Right atrium:  The atrium was normal in size.  No evidence of thrombus in the atrial cavity or appendage. The appendage was morphologically a right appendage.  ------------------------------------------------------------------- Pericardium:  There was no pericardial effusion.  ------------------------------------------------------------------- Measurements   Pulmonary arteries           Value       Reference  PA pressure, S, DP (H)       60    mm Hg <=30  Legend: (L)  and  (H)  mark values outside specified reference range.  ------------------------------------------------------------------- Prepared and Electronically Authenticated by  Esmond Plants, MD, [facility] 2018-02-01T18:33:05  RIGHT/LEFT HEART CATH AND CORONARY ANGIOGRAPHY  Conclusion   1. Patent coronary arteries with the exception of severe stenosis in the right PDA. There is mild nonobstructive stenosis of the LAD and RCA and a widely patent left main and left circumflex 2. Moderate to severe pulmonary hypertension with pulmonary artery pressure of 74/21  with a mean of 45 and a pulmonary capillary wedge pressure of 23 with a 32 mmHg T-wave 3. Mean transaortic valve gradient of 17 mmHg and calculated aortic valve area of 1.0 cm, suspect contamination of the pressure gradient with use of an AL-1 catheter and difficult measurement because of frequent ectopy  Recommend: Continued evaluation for TAVR. The patient's coronary disease is appropriate for medical therapy. She does have hemodynamics consistent with both left and right-sided congestive heart failure with elevated pulmonary wedge pressure and pulmonary hypertension. Her echo study is diagnostic of severe aortic stenosis.  Indications   Severe aortic stenosis [I35.0 (ICD-10-CM)]  Procedural Details/Technique   Technical Details INDICATION: Severe aortic stenosis, pre-TAVR evaluation  PROCEDURAL DETAILS: There was an indwelling IV in a right antecubital vein. Using normal sterile technique, the IV was changed out for a 5 Fr brachial sheath over a 0.018 inch wire. The right wrist was then prepped, draped, and anesthetized with 1% lidocaine. Using the modified Seldinger technique a 5/6 French Slender sheath was placed in the right radial artery. Intra-arterial verapamil was administered through the radial artery sheath. IV heparin was administered after a JR4 catheter was advanced into the central aorta. A Swan-Ganz catheter was used for the right heart catheterization. A venogram was performed because it was difficult to pass the Swan-Ganz catheter into the heart. The catheter was advanced over an angled Glidewire. Standard protocol was followed for recording of right heart pressures and sampling of oxygen saturations. Fick cardiac output was calculated. Standard Judkins catheters were used for selective coronary angiography. The aortic valve was crossed with an AL-1 catheter directing a straight tip wire. LV pressure was recorded with the AL-1 catheter. A pullback was performed across the aortic  valve. There were no immediate procedural complications. The patient was transferred to the post catheterization recovery area for further monitoring.     Estimated blood loss <50 mL.  During this procedure the patient was administered the following to achieve and maintain moderate conscious sedation: Versed 0.5 mg, while the patient's heart rate, blood pressure, and oxygen saturation were continuously monitored. The period of conscious sedation was 41 minutes, of which I was present face-to-face 100% of this time.    Coronary Findings   Dominance:  Right  Left Main  Vessel is angiographically normal.  Left Anterior Descending  There is mild the vessel.  Dist LAD lesion, 50% stenosed. The lesion is calcified.  Left Circumflex  The vessel exhibits minimal luminal irregularities.  First Obtuse Marginal Branch  The vessel exhibits minimal luminal irregularities.  Right Coronary Artery  There is mild the vessel.  Mid RCA lesion, 40% stenosed. The lesion is calcified.  Right Posterior Descending Artery  RPDA lesion, 80% stenosed. The lesion is calcified.  Left Heart   Aortic Valve The aortic valve is calcified. There is restricted aortic valve motion.    Coronary Diagrams   Diagnostic Diagram       Implants     No implant documentation for this case.  PACS Images   Show images for CARDIAC CATHETERIZATION   Link to Procedure Log   Procedure Log    Hemo Data    Most Recent Value  Fick Cardiac Output 4.93 L/min  Fick Cardiac Output Index 3.24 (L/min)/BSA  Aortic Mean Gradient 17.4 mmHg  Aortic Peak Gradient 15 mmHg  Aortic Valve Area 1.03  Aortic Value Area Index 0.68 cm2/BSA  RA A Wave 11 mmHg  RA V Wave 6 mmHg  RA Mean 6 mmHg  RV Systolic Pressure 72 mmHg  RV Diastolic Pressure 2 mmHg  RV EDP 7 mmHg  PA Systolic Pressure 74 mmHg  PA Diastolic Pressure 21 mmHg  PA Mean 45 mmHg  PW A Wave 25 mmHg  PW V Wave 32 mmHg  PW Mean 23 mmHg  AO Systolic Pressure 509  mmHg  AO Diastolic Pressure 47 mmHg  AO Mean 92 mmHg  LV Systolic Pressure 326 mmHg  LV Diastolic Pressure 17 mmHg  LV EDP 22 mmHg  Arterial Occlusion Pressure Extended Systolic Pressure 712 mmHg  Arterial Occlusion Pressure Extended Diastolic Pressure 51 mmHg  Arterial Occlusion Pressure Extended Mean Pressure 96 mmHg  Left Ventricular Apex Extended Systolic Pressure 458 mmHg  Left Ventricular Apex Extended Diastolic Pressure 12 mmHg  Left Ventricular Apex Extended EDP Pressure 38 mmHg  QP/QS 1  TPVR Index 13.88 HRUI  TSVR Index 28.38 HRUI  PVR SVR Ratio 0.26  TPVR/TSVR Ratio 0.49    Cardiac TAVR CT  TECHNIQUE: The patient was scanned on a Siemens Force 099 slice scanner. A 120 kV retrospective scan was triggered in the ascending thoracic aorta at 140 HU's. Gantry rotation speed was 250 msecs and collimation was .6 mm. No beta blockade or nitro were given. The 3D data set was reconstructed in 5% intervals of the R-R cycle. Systolic and diastolic phases were analyzed on a dedicated work station using MPR, MIP and VRT modes. The patient received 80 cc of contrast.  FINDINGS: Aortic Valve:  Tri leaflet and severely calcified  Aorta: Mild aortic root dilatation Normal arch vessels moderate calcified aortic debris in arch and descending thoracic aorta  Sino-tubular Junction:  30 mm  Ascending Thoracic Aorta:  38 mm  Aortic Arch:  27 mm  Descending Thoracic Aorta:  25 mm  Sinus of Valsalva Measurements:  Non-coronary:  29 mm  Right - coronary:  30.8 mm  Left -   coronary:  30.5 mm  Coronary Artery Height above Annulus:  Left Main:  Shallow only 10 mm above annulus  Right Coronary:  11.6 mm  Virtual Basal Annulus Measurements:  Maximum / Minimum Diameter:  26.2 mm x 19.3 mm  Perimeter:  75 mm  Area:  408 mm2  Coronary Arteries:  Shallow LM  ostium above annulus  Optimum Fluoroscopic Angle for Delivery: LAO 12 degrees Caudal  11 degrees  IMPRESSION: 1) Severely calcified tri leaflet aortic valve with annular area suitable for a 23 mm Sapien 3 valve. Perimeter measures for 29 mm Evolut Pro but shallow LM and Sinus height less than 15 mm would suggest 26 mm valve would be better  2) No LAA thrombus  3) Optimum angle for deployment LAO 12 degrees Caudal 11 degrees  4) Shallow LM height above annulus 10 mm  5) Moderate calcific aortic debris in arch and descending thoracic aorta  Jenkins Rouge  Electronically Signed: By: Jenkins Rouge M.D. On: 11/03/2016 16:16   CT ANGIOGRAPHY CHEST, ABDOMEN AND PELVIS  TECHNIQUE: Multidetector CT imaging through the chest, abdomen and pelvis was performed using the standard protocol during bolus administration of intravenous contrast. Multiplanar reconstructed images and MIPs were obtained and reviewed to evaluate the vascular anatomy.  CONTRAST:  100 mL of Isovue 370.  COMPARISON:  None.  FINDINGS: CTA CHEST FINDINGS  Cardiovascular: Heart size is enlarged. There is no significant pericardial fluid, thickening or pericardial calcification. Severe thickening calcification of the aortic valve. There is aortic atherosclerosis, as well as atherosclerosis of the great vessels of the mediastinum and the coronary arteries, including calcified atherosclerotic plaque in the left anterior descending, left circumflex and right coronary arteries.  Mediastinum/Lymph Nodes: No pathologically enlarged mediastinal or hilar lymph nodes. Esophagus is unremarkable in appearance. No axillary lymphadenopathy.  Lungs/Pleura: A few scattered tiny pulmonary nodules noted throughout the lungs bilaterally, the largest of which measures 6 x 4 mm (mean diameter 5 mm) 80 apex of the left upper lobe (axial image 18 of series 16). No acute consolidative airspace disease. Small left pleural effusion. Small amount of pleural fluid loculated in the superior aspect of  the left major fissure.  Musculoskeletal/Soft Tissues: There are no aggressive appearing lytic or blastic lesions noted in the visualized portions of the skeleton.  CTA ABDOMEN AND PELVIS FINDINGS  Hepatobiliary: No cystic or solid hepatic lesions. No intra or extrahepatic biliary ductal dilatation. Gallbladder is normal in appearance.  Pancreas: No pancreatic mass. No pancreatic ductal dilatation. No pancreatic or peripancreatic fluid or inflammatory changes.  Spleen: Unremarkable.  Adrenals/Urinary Tract: Bilateral kidneys and bilateral adrenal glands are normal in appearance. No hydroureteronephrosis. Urinary bladder is normal in appearance.  Stomach/Bowel: Normal appearance of the stomach. No pathologic dilatation of small bowel or colon. Numerous colonic diverticulae are noted, without surrounding inflammatory changes to suggest an acute diverticulitis at this time. The appendix is not confidently identified and may be surgically absent. Regardless, there are no inflammatory changes noted adjacent to the cecum to suggest the presence of an acute appendicitis at this time.  Vascular/Lymphatic: Aortic atherosclerosis, with vascular findings and measurements pertinent to potential TAVR procedure, as detailed below. No aneurysm or dissection noted in the abdominal or pelvic vasculature. Moderate stenosis of the ostium of the celiac axis. Mild stenosis of the ostium of the superior mesenteric artery. Inferior mesenteric artery is widely patent. Renal arteries bilaterally appear patent without hemodynamically significant stenosis. No lymphadenopathy noted in the abdomen or pelvis.  Reproductive: Uterus and ovaries are atrophic.  Other: Trace volume of ascites.  No pneumoperitoneum.  Musculoskeletal: There are no aggressive appearing lytic or blastic lesions noted in the visualized portions of the skeleton.  VASCULAR MEASUREMENTS PERTINENT TO  TAVR:  AORTA:  Minimal Aortic Diameter -  13 x 13 mm  Severity of Aortic Calcification -  mild to  moderate  RIGHT PELVIS:  Right Common Iliac Artery -  Minimal Diameter - 8.9 x 6.0 mm  Tortuosity - mild  Calcification - mild moderate  Right External Iliac Artery -  Minimal Diameter - 7.6 x 7.6 mm  Tortuosity - mild  Calcification - none  Right Common Femoral Artery -  Minimal Diameter - 7.2 x 6.1 mm  Tortuosity - mild  Calcification - mild  LEFT PELVIS:  Left Common Iliac Artery -  Minimal Diameter - 8.8 x 8.0 mm  Tortuosity - mild  Calcification - mild to moderate  Left External Iliac Artery -  Minimal Diameter - 6.9 x 6.7 mm  Tortuosity - mild  Calcification - none  Left Common Femoral Artery -  Minimal Diameter - 6.0 x 5.8 mm  Tortuosity - mild  Calcification - none  Review of the MIP images confirms the above findings.  IMPRESSION: 1. Vascular findings and measurements pertinent to potential TAVR procedure, as detailed above. This patient does have suitable pelvic arterial access bilaterally. 2. Severe thickening calcification of the aortic valve, compatible with the reported clinical history of severe aortic stenosis. 3. Small left pleural effusion. 4. A few scattered tiny pulmonary nodules are noted in the lungs bilaterally measuring 5 mm or less in size. These are nonspecific, but are statistically likely benign. No follow-up needed if patient is low-risk (and has no known or suspected primary neoplasm). Non-contrast chest CT can be considered in 12 months if patient is high-risk. This recommendation follows the consensus statement: Guidelines for Management of Incidental Pulmonary Nodules Detected on CT Images: From the Fleischner Society 2017; Radiology 2017; 284:228-243. 5. Cardiomegaly. 6. Colonic diverticulosis without evidence to suggest an acute diverticulitis at this time. 7. Additional  incidental findings, as above. Aortic Atherosclerosis (ICD10-I70.0).   Electronically Signed   By: Vinnie Langton M.D.   On: 11/04/2016 14:50   Impression:  Patient has stage D severe symptomatic aortic stenosis and at least moderate aortic insufficiency. She describes long-standing symptoms of exertional shortness breath and fatigue consistent with chronic diastolic congestive heart failure, New York Heart Association functional class III. She has had several hospitalizations over the past 6-8 months with acute exacerbation of symptoms. I personally reviewed the patient's transthoracic and transesophageal echocardiograms performed in January and February of last year as well as her more recent diagnostic cardiac catheterization. The aortic valve is heavily calcified with moderately restricted leaflet mobility. Peak velocity across the aortic valve was reported 4.9 m/s corresponding to mean transvalvular gradient estimated 60 mmHg at the time of her presentation with sepsis last January. Transesophageal echocardiogram suggest that the severity of aortic stenosis may not be quite so bad, but the patient has at least moderate aortic insufficiency. She continues to have problems with congestive heart failure. The patient is quite elderly and frail and should not be considered candidate for conventional surgical aortic valve replacement under any circumstances. Her life expectancy and how much improvement in her functional status she may experience following successful surgical treatment of aortic stenosis is difficult to anticipate. Nevertheless, Cardiac-gated CTA of the heart reveals anatomical characteristics consistent with aortic stenosis suitable for treatment by transcatheter aortic valve replacement without any significant complicating features and CTA of the aorta and iliac vessels demonstrate what appears to be adequate pelvic vascular access to facilitate a transfemoral approach.  Options  include long-term palliative medical therapy versus transcatheter aortic valve replacement.    Plan:  The patient and her daughter counseled at length regarding treatment alternatives  for management of severe symptomatic aortic stenosis. Alternative approaches such as conventional aortic valve replacement, transcatheter aortic valve replacement, and palliative medical therapy were compared and contrasted at length.  The risks associated with conventional surgical aortic valve replacement were been discussed in detail, as were reasons why I would not consider this patient a candidate for conventional surgery.  Issues specific to transcatheter aortic valve replacement were discussed including questions about long term valve durability, the potential for paravalvular leak, possible increased risk of need for permanent pacemaker placement, and other technical complications related to the procedure itself.  Long-term prognosis with medical therapy was discussed. This discussion was placed in the context of the patient's own specific clinical presentation and past medical history.  All of their questions been addressed.  The patient desires to proceed with transcatheter aortic valve replacement in the near future.  Following the decision to proceed with transcatheter aortic valve replacement, a discussion has been held regarding what types of management strategies would be attempted intraoperatively in the event of life-threatening complications, including whether or not the patient would be considered a candidate for the use of cardiopulmonary bypass and/or conversion to open sternotomy for attempted surgical intervention.  The patient has been advised of a variety of complications that might develop including but not limited to risks of death, stroke, paravalvular leak, aortic dissection or other major vascular complications, aortic annulus rupture, device embolization, cardiac rupture or perforation, mitral  regurgitation, acute myocardial infarction, arrhythmia, heart block or bradycardia requiring permanent pacemaker placement, congestive heart failure, respiratory failure, renal failure, pneumonia, infection, other late complications related to structural valve deterioration or migration, or other complications that might ultimately cause a temporary or permanent loss of functional independence or other long term morbidity.  The patient provides full informed consent for the procedure as described and all questions were answered.   I spent in excess of 90 minutes during the conduct of this office consultation and >50% of this time involved direct face-to-face encounter with the patient for counseling and/or coordination of their care.    Valentina Gu. Roxy Manns, MD 11/11/2016 11:04 AM

## 2016-11-11 NOTE — Patient Instructions (Signed)
   Continue taking all current medications without change through the day before surgery.  Have nothing to eat or drink after midnight the night before surgery.  On the morning of surgery take only Protonix with a sip of water.    

## 2016-11-11 NOTE — Pre-Procedure Instructions (Signed)
[name]  11/11/2016    Your procedure is scheduled on Tuesday, September 18.  Report to [facility] Admitting at 8:00 A.M.   Call this number if you have problems the morning of surgery: [phone]    Remember:  Do not eat food or drink liquids after midnight.  Take these medicines the morning of surgery with A SIP OF WATER : allopurinol (ZYLOPRIM) gabapentin (NEURONTIN)  hydrALAZINE (APRESOLINE) sertraline (ZOLOFT) pantoprazole (PROTONIX) isosorbide mononitrate (IMDUR)  May use eye drops.   May take if needed:  tiZANidine (ZANAFLEX)  LORazepam (ATIVAN)  HYDROcodone-acetaminophen (NORCO/VICODIN)  Special instructions:  [facility]- Preparing For Surgery  Before surgery, you can play an important role. Because skin is not sterile, your skin needs to be as free of germs as possible. You can reduce the number of germs on your skin by washing with CHG (chlorahexidine gluconate) Soap before surgery.  CHG is an antiseptic cleaner which kills germs and bonds with the skin to continue killing germs even after washing.  Please do not use if you have an allergy to CHG or antibacterial soaps. If your skin becomes reddened/irritated stop using the CHG.  Do not shave (including legs and underarms) for at least 48 hours prior to first CHG shower. It is OK to shave your face.  Please follow these instructions carefully.   1. Shower the NIGHT BEFORE SURGERY and the MORNING OF SURGERY with CHG.   2. If you chose to wash your hair, wash your hair first as usual with your normal shampoo.  3. After you shampoo, rinse your hair and body thoroughly to remove the shampoo.  Wash your face and private area.  4. Use CHG as you would any other liquid soap. You can apply CHG directly to the skin and wash gently with a scrungie or a clean washcloth.   5. Apply the CHG Soap to your body ONLY FROM THE NECK DOWN.  Do not use on open wounds or open sores. Avoid contact  with your eyes, ears, mouth and genitals (private parts). Wash genitals (private parts) with your normal soap.  6. Wash thoroughly, paying special attention to the area where your surgery will be performed.  7. Thoroughly rinse your body with warm water from the neck down.  8. DO NOT shower/wash with your normal soap after using and rinsing off the CHG Soap.  9. Pat yourself dry with a CLEAN TOWEL.   10. Wear CLEAN PAJAMAS   11. Place CLEAN SHEETS on your bed the night of your first shower and DO NOT SLEEP WITH PETS.  Day of Surgery: Shower as above Do not apply any deodorants/lotions, powders and colognes. Please wear clean clothes to the hospital/surgery center.    Do not wear jewelry, make-up or nail polish..  Do not shave 48 hours prior to surgery.  Men may shave face and neck.  Do not bring valuables to the hospital.  [facility] is not responsible for any belongings or valuables.  Contacts, dentures or bridgework may not be worn into surgery.  Leave your suitcase in the car.  After surgery it may be brought to your room.  For patients admitted to the hospital, discharge time will be determined by your treatment team.  Patients discharged the day of surgery will not be allowed to drive home.   Please read over the following fact sheets that you were given: Pain Booklet, Patient Instructions for Mupirocin Application, Incentive Spirometry, Surgical Site Infections.

## 2016-11-11 NOTE — Progress Notes (Signed)
   How to Manage Your Diabetes Before and After Surgery  Why is it important to control my blood sugar before and after surgery? . Improving blood sugar levels before and after surgery helps healing and can limit problems. . A way of improving blood sugar control is eating a healthy diet by: o  Eating less sugar and carbohydrates o  Increasing activity/exercise o  Talking with your doctor about reaching your blood sugar goals . High blood sugars (greater than 180 mg/dL) can raise your risk of infections and slow your recovery, so you will need to focus on controlling your diabetes during the weeks before surgery. . Make sure that the doctor who takes care of your diabetes knows about your planned surgery including the date and location.  How do I manage my blood sugar before surgery? . Check your blood sugar at least 4 times a day, starting 2 days before surgery, to make sure that the level is not too high or low. o Check your blood sugar the morning of your surgery when you wake up and every 2 hours until you get to the Short Stay unit. . If your blood sugar is less than 70 mg/dL, you will need to treat for low blood sugar: o Do not take insulin. o Treat a low blood sugar (less than 70 mg/dL) with  cup of clear juice (cranberry or apple). o Recheck blood sugar in 15 minutes after treatment (to make sure it is greater than 70 mg/dL). If your blood sugar is not greater than 70 mg/dL on recheck, call [phone] for further instructions. . Report your blood sugar to the short stay nurse when you get to Short Stay.  . If you are admitted to the hospital after surgery: o Your blood sugar will be checked by the staff and you will probably be given insulin after surgery (instead of oral diabetes medicines) to make sure you have good blood sugar levels. o The goal for blood sugar control after surgery is 80-180 mg/dL.

## 2016-11-14 ENCOUNTER — Ambulatory Visit (HOSPITAL_COMMUNITY)
Admission: RE | Admit: 2016-11-14 | Discharge: 2016-11-14 | Disposition: A | Discharge status: Home / Self Care | Payer: Medicare HMO | Source: Ambulatory Visit | Attending: Cardiovascular Disease | Admitting: Cardiovascular Disease

## 2016-11-14 ENCOUNTER — Encounter (HOSPITAL_COMMUNITY): Payer: Self-pay

## 2016-11-14 ENCOUNTER — Other Ambulatory Visit (HOSPITAL_COMMUNITY): Payer: Self-pay

## 2016-11-14 ENCOUNTER — Encounter (HOSPITAL_COMMUNITY)
Admission: RE | Admit: 2016-11-14 | Discharge: 2016-11-14 | Disposition: A | Discharge status: Home / Self Care | Payer: Medicare HMO | Source: Ambulatory Visit | Attending: Cardiovascular Disease | Admitting: Cardiovascular Disease

## 2016-11-14 DIAGNOSIS — I350 Nonrheumatic aortic (valve) stenosis: Secondary | ICD-10-CM | POA: Insufficient documentation

## 2016-11-14 DIAGNOSIS — Z01812 Encounter for preprocedural laboratory examination: Secondary | ICD-10-CM | POA: Insufficient documentation

## 2016-11-14 DIAGNOSIS — Z01818 Encounter for other preprocedural examination: Secondary | ICD-10-CM | POA: Insufficient documentation

## 2016-11-14 LAB — CBC
HCT: 35 % — ABNORMAL LOW (ref 36.0–46.0)
Hemoglobin: 11.3 g/dL — ABNORMAL LOW (ref 12.0–15.0)
MCH: 30.1 pg (ref 26.0–34.0)
MCHC: 32.3 g/dL (ref 30.0–36.0)
MCV: 93.1 fL (ref 78.0–100.0)
PLATELETS: 184 10*3/uL (ref 150–400)
RBC: 3.76 MIL/uL — ABNORMAL LOW (ref 3.87–5.11)
RDW: 15 % (ref 11.5–15.5)
WBC: 9.3 10*3/uL (ref 4.0–10.5)

## 2016-11-14 LAB — URINALYSIS, ROUTINE W REFLEX MICROSCOPIC
BILIRUBIN URINE: NEGATIVE
Glucose, UA: NEGATIVE mg/dL
Hgb urine dipstick: NEGATIVE
Ketones, ur: NEGATIVE mg/dL
Nitrite: NEGATIVE
PH: 5 (ref 5.0–8.0)
Protein, ur: 30 mg/dL — AB
SPECIFIC GRAVITY, URINE: 1.021 (ref 1.005–1.030)

## 2016-11-14 LAB — COMPREHENSIVE METABOLIC PANEL
ALBUMIN: 3.5 g/dL (ref 3.5–5.0)
ALT: 20 U/L (ref 14–54)
ANION GAP: 8 (ref 5–15)
AST: 30 U/L (ref 15–41)
Alkaline Phosphatase: 35 U/L — ABNORMAL LOW (ref 38–126)
BILIRUBIN TOTAL: 0.8 mg/dL (ref 0.3–1.2)
BUN: 31 mg/dL — ABNORMAL HIGH (ref 6–20)
CO2: 27 mmol/L (ref 22–32)
Calcium: 9.3 mg/dL (ref 8.9–10.3)
Chloride: 105 mmol/L (ref 101–111)
Creatinine, Ser: 0.66 mg/dL (ref 0.44–1.00)
GFR calc Af Amer: >60 mL/min (ref >60)
GFR calc non Af Amer: >60 mL/min (ref >60)
Glucose, Bld: 126 mg/dL — ABNORMAL HIGH (ref 65–99)
POTASSIUM: 3.4 mmol/L — AB (ref 3.5–5.1)
SODIUM: 140 mmol/L (ref 135–145)
TOTAL PROTEIN: 6.1 g/dL — AB (ref 6.5–8.1)

## 2016-11-14 LAB — SURGICAL PCR SCREEN
MRSA, PCR: NEGATIVE
Staphylococcus aureus: NEGATIVE

## 2016-11-14 LAB — BLOOD GAS, ARTERIAL
ACID-BASE EXCESS: 4.6 mmol/L — AB (ref 0.0–2.0)
BICARBONATE: 28.6 mmol/L — AB (ref 20.0–28.0)
Drawn by: 449841
O2 SAT: 96.7 %
PATIENT TEMPERATURE: 98.6
pCO2 arterial: 43.1 mmHg (ref 32.0–48.0)
pH, Arterial: 7.438 (ref 7.350–7.450)
pO2, Arterial: 83.1 mmHg (ref 83.0–108.0)

## 2016-11-14 LAB — HEMOGLOBIN A1C
Hgb A1c MFr Bld: 6 % — ABNORMAL HIGH (ref 4.8–5.6)
Mean Plasma Glucose: 125.5 mg/dL

## 2016-11-14 LAB — ABO/RH: ABO/RH(D): A POS

## 2016-11-14 LAB — APTT: APTT: 30 s (ref 24–36)

## 2016-11-14 LAB — PROTIME-INR
INR: 1.1
PROTHROMBIN TIME: 14.1 s (ref 11.4–15.2)

## 2016-11-14 LAB — GLUCOSE, CAPILLARY: GLUCOSE-CAPILLARY: 129 mg/dL — AB (ref 65–99)

## 2016-11-14 MED ORDER — CHLORHEXIDINE GLUCONATE 4 % EX LIQD: 30.0000 mL | CUTANEOUS | Status: DC

## 2016-11-14 MED ORDER — DOPAMINE-DEXTROSE 3.2-5 MG/ML-% IV SOLN
0.0000 ug/kg/min | INTRAVENOUS | Status: DC
  Filled 2016-11-14 (×2): qty 250

## 2016-11-14 MED ORDER — SODIUM CHLORIDE 0.9 % IV SOLN: INTRAVENOUS | Status: DC

## 2016-11-14 MED ORDER — MAGNESIUM SULFATE 50 % IJ SOLN
40.0000 meq | INTRAMUSCULAR | Status: DC
  Filled 2016-11-14 (×2): qty 10

## 2016-11-14 MED ORDER — HEPARIN SODIUM (PORCINE) 1000 UNIT/ML IJ SOLN
INTRAMUSCULAR | Status: DC
  Filled 2016-11-14 (×2): qty 30

## 2016-11-14 MED ORDER — EPINEPHRINE PF 1 MG/ML IJ SOLN
0.0000 ug/min | INTRAVENOUS | Status: DC
  Filled 2016-11-14 (×2): qty 4

## 2016-11-14 MED ORDER — POTASSIUM CHLORIDE 2 MEQ/ML IV SOLN
80.0000 meq | INTRAVENOUS | Status: DC
  Filled 2016-11-14 (×2): qty 40

## 2016-11-14 MED ORDER — DEXMEDETOMIDINE HCL IN NACL 400 MCG/100ML IV SOLN
0.1000 ug/kg/h | INTRAVENOUS | Status: DC
  Filled 2016-11-14 (×2): qty 100

## 2016-11-14 MED ORDER — DEXTROSE 5 % IV SOLN
1.5000 g | INTRAVENOUS | Status: DC
  Filled 2016-11-14: qty 1.5

## 2016-11-14 MED ORDER — VANCOMYCIN HCL 10 G IV SOLR
1250.0000 mg | INTRAVENOUS | Status: DC
  Filled 2016-11-14 (×2): qty 1250

## 2016-11-14 MED ORDER — SODIUM CHLORIDE 0.9 % IV SOLN
INTRAVENOUS | Status: DC
  Filled 2016-11-14 (×2): qty 1

## 2016-11-14 MED ORDER — NITROGLYCERIN IN D5W 200-5 MCG/ML-% IV SOLN
2.0000 ug/min | INTRAVENOUS | Status: DC
  Filled 2016-11-14: qty 250

## 2016-11-14 MED ORDER — PHENYLEPHRINE HCL 10 MG/ML IJ SOLN
30.0000 ug/min | INTRAMUSCULAR | Status: DC
  Filled 2016-11-14 (×2): qty 2

## 2016-11-14 MED ORDER — CHLORHEXIDINE GLUCONATE 0.12 % MT SOLN
15.0000 mL | Freq: Once | OROMUCOSAL | Status: AC
  Administered 2016-11-15: 15 mL via OROMUCOSAL
  Filled 2016-11-14: qty 15

## 2016-11-14 MED ORDER — NOREPINEPHRINE BITARTRATE 1 MG/ML IV SOLN
0.0000 ug/min | INTRAVENOUS | Status: DC
  Filled 2016-11-14 (×2): qty 4

## 2016-11-14 NOTE — H&P (Signed)
[address]       [place] [address]             [phone]      Cardiothoracic Surgery Admission History and Physical   Referring Provider is Gollan, Kathlene November, MD PCP is Tegeler, Loma Sousa, MD       Chief Complaint  Patient presents with   . Aortic Stenosis          HPI:  The patient is an 83 year old woman with hypertension, DM, hyperlipidemia and known aortic stenosis that has been followed by Dr. Rockey Situ. Her echo in 08/2014 showed severe AS with a mean gradient of 38 mm Hg and peak velocity ratio of 0.25. She was admitted in 02/2016 with enterococcal sepsis and a TEE showed what was felt to be moderate AS with an AVA of 1.34 cm2 with mild to moderate AI and normal LV function. There was no evidence of endocarditis. There was moderate pulmonary HTN. Her daughter is with her today and says that since then she has been deteriorating with progressive exertional SOB and fatigue. She was most recently hospitalized in July with acute on chronic diastolic heart failure. She was discharged to a rehab facility and just recently went back home. Her daughter says that she is walking in the house without a cane or walker but gets short of breath easily. She can't go out of the house to do the things she used to do since January.          Past Medical History:  Diagnosis Date   . Anemia    . Bone disease    . Carotid arterial disease ([place])     a. 08/2014 U/S: Bilateral < 50% stenosis. Patent vertebrals w/ antegrade flow.    . Cervical spine arthritis ([place])    . Chronic diastolic CHF (congestive heart failure) ([place])     a. echo 2014: EF 55-60%, DD;  b. 08/2014 Echo: EF 55-60%, no RWMA, GR1DD; c. 02/2016 Echo: EF 65-70%, Gr1 DD.   Marland Kitchen Cognitive impairment    . Depression    . Diabetes mellitus without complication ([place])    . Essential hypertension    . GERD (gastroesophageal reflux disease)    . Glaucoma    . Gout    . Hiatal hernia    .  History of renal impairment    . Hyperlipidemia    . Hypotension     a. Related to Norvasc - 11/2014 ED visit.   . Multinodular goiter     a. Noted incidentally on CT 07/2012 and carotid U/S 08/2014;  b. Nl TSH 11/2014.   Marland Kitchen Paroxysmal SVT (supraventricular tachycardia) ([facility])     a. on Toprol    . Prolapsed uterus    . Severe aortic stenosis     a. echo 08/2014: EF 55-60%, no RWMA, GR1DD, severe AS, mild AI, Mean gradient (S): 38 mm Hg. Valve area (VTI): 0.78 cm^2., mild-mod MR, LA mildly dilated, atrial septal aneurysm, mild TR;  b. 02/2016 Echo: EF 65-70%, Gr1 DD, sev AS, mild to mod AI, Valve area (VTI): 0.94 cm^2, (Vmax) 0.96 cm^2, (Vmean) 0.93cm^2, mild MR, mildly dil LA, nl RV fxn, PASP 98mmHg; c. 03/2016 TEE: EF 55-60, mod AS.   Marland Kitchen Stroke [facility])     a. CT head 07/2014 showed small thalamic infarct   . Vitamin deficiency            Past Surgical History:  Procedure Laterality Date   . TEE WITHOUT CARDIOVERSION N/A 03/31/2016    Procedure: TRANSESOPHAGEAL ECHOCARDIOGRAM (TEE);  Surgeon: Minna Merritts, MD;  Location: [facility] ORS;  Service: Cardiovascular;  Laterality: N/A;   . TUBAL LIGATION             Family History  Problem Relation Age of Onset   . Glaucoma Mother    . Diabetes Mother    . Peptic Ulcer Father    . Colitis Father      Social History         Social History  . Marital status: Widowed     Spouse name: N/A   . Number of children: N/A   . Years of education: N/A        Occupational History  . Not on file.         Social History Main Topics  . Smoking status: Never Smoker   . Smokeless tobacco: Never Used   . Alcohol use No   . Drug use: No   . Sexual activity: Not on file         Other Topics Concern  . Not on file    Social History Narrative  . No narrative on file            Current Outpatient Prescriptions  Medication Sig Dispense Refill   . acetaminophen (TYLENOL) 500 MG tablet Take 500 mg by mouth every  6 (six) hours as needed for mild pain or headache.     . Amino Acids-Protein Hydrolys (FEEDING SUPPLEMENT, PRO-STAT SUGAR FREE 64,) LIQD Take 30 mLs by mouth 3 (three) times daily with meals.     Marland Kitchen aspirin 81 MG tablet Take 81 mg by mouth daily.     . brimonidine (ALPHAGAN P) 0.1 % SOLN Place 1 drop into the right eye 2 (two) times daily.     . Cholecalciferol (VITAMIN D) 2000 units CAPS Take 2,000 Units by mouth daily.      . Dorzolamide HCl-Timolol Mal (COSOPT OP) Place 1 drop into the right eye 2 (two) times daily.      . feeding supplement, ENSURE ENLIVE, (ENSURE ENLIVE) LIQD Take 237 mLs by mouth 2 (two) times daily between meals. 237 mL 12   . furosemide (LASIX) 20 MG tablet Take 20 mg by mouth daily as needed (with potassium).     Marland Kitchen gabapentin (NEURONTIN) 300 MG capsule Take 300 mg by mouth 2 (two) times daily.     . hydrALAZINE (APRESOLINE) 50 MG tablet Take 50 mg by mouth 3 (three) times daily as needed (elevated BP).     Marland Kitchen HYDROcodone-acetaminophen (NORCO/VICODIN) 5-325 MG tablet Take 1 tablet by mouth every 6 (six) hours as needed for moderate pain. 30 tablet 0   . iron polysaccharides (NIFEREX) 150 MG capsule Take 150 mg by mouth 2 (two) times daily.     . isosorbide mononitrate (IMDUR) 30 MG 24 hr tablet Take 1 tablet (30 mg total) by mouth daily. 30 tablet 0   . latanoprost (XALATAN) 0.005 % ophthalmic solution Place 1 drop into the right eye at bedtime.  3   . lisinopril (PRINIVIL,ZESTRIL) 10 MG tablet Take 10 mg by mouth daily.     Marland Kitchen LORazepam (ATIVAN) 0.5 MG tablet Take 1 tablet (0.5 mg total) by mouth at bedtime. Take 1 tablet at bedtime as needed for sleep or anxiety 60 tablet 0   . lovastatin (MEVACOR) 40 MG tablet Take 40 mg by mouth at bedtime.     Marland Kitchen  metoprolol succinate (TOPROL-XL) 25 MG 24 hr tablet Take 25 mg by mouth daily.     . pantoprazole (PROTONIX) 40 MG tablet Take 40 mg by mouth daily.     . pilocarpine (PILOCAR) 4 % ophthalmic solution  Place 1 drop into the right eye 4 (four) times daily.      Marland Kitchen tiZANidine (ZANAFLEX) 4 MG tablet Take 1 tablet by mouth 3 (three) times daily as needed for muscle spasms.   1   . traZODone (DESYREL) 100 MG tablet Take 100 mg by mouth at bedtime.     . vitamin C (ASCORBIC ACID) 250 MG tablet Take 250 mg by mouth daily.     . potassium chloride (K-DUR) 10 MEQ tablet Take 1 tablet (10 mEq total) by mouth daily. Take with lasix 30 tablet 6    No current facility-administered medications for this visit.          Allergies  Allergen Reactions   . Metformin And Related Diarrhea       Review of Systems:        General:                      normal appetite, reduced energy, no weight gain, no weight loss, no fever       Cardiac:                       no chest pain with exertion, no chest pain at rest, has SOB with minimal exertion, no resting SOB, no PND, no orthopnea, no palpitations, no arrhythmia, no atrial fibrillation, has LE edema, no dizzy spells, no syncope       Respiratory:                 exertional shortness of breath, no home oxygen, no productive cough, no dry cough, no bronchitis, no wheezing, no hemoptysis, no asthma, no pain with inspiration or cough, no sleep apnea, no CPAP at night       GI:                               no difficulty swallowing, no reflux, no frequent heartburn, has hiatal hernia, no abdominal pain, has constipation, no diarrhea, no hematochezia, no hematemesis, no melena       GU:                              no dysuria,  no frequency, no urinary tract infection, no hematuria,  no kidney stones, no kidney disease       Vascular:                     no pain suggestive of claudication, no pain in feet, no leg cramps, no varicose veins, no DVT, no non-healing foot ulcer       Neuro:                         Hx of stroke, no TIA's, no seizures, no headaches, no temporary blindness one eye,  no slurred speech, no peripheral neuropathy, no chronic pain, has  instability of gait, no memory/cognitive dysfunction       Musculoskeletal:         has arthritis, no joint swelling, no myalgias, some difficulty walking, reduced mobility  Skin:                            no rash, no itching, no skin infections, no pressure sores or ulcerations       Psych:                         no anxiety, no depression, no nervousness, no unusual recent stress       Eyes:                           no blurry vision, no floaters, no recent vision changes,  wears glasses     ENT:                            no hearing loss, no loose or painful teeth, wears dentures, has not seen a dentist in several years       Hematologic:               no easy bruising, no abnormal bleeding, no clotting disorder, no frequent epistaxis       Endocrine:                   has diabetes, does  check CBG's at home                       Physical Exam:        BP (!) 170/60   Pulse 87   Resp 20   Ht 5' (1.524 m)   Wt 126 lb (57.2 kg)   SpO2 97% Comment: RA  BMI 24.61 kg/m        General:                      Frail, elderly woman in no distress       HEENT:                       Unremarkable, NCAT, PERLA, EOMI, oropharynx clear       Neck:                           no JVD, no bruits, no adenopathy or thyromegaly       Chest:                          clear to auscultation, symmetrical breath sounds, no wheezes, no rhonchi        CV:                              RRR, grade III/VI crescendo/decrescendo murmur heard best at RSB,  no diastolic murmur       Abdomen:                    soft, non-tender, no masses or organomegaly       Extremities:                 warm, well-perfused, pulses not palpable, no LE edema       Rectal/GU                   Deferred  Neuro:                         Grossly non-focal and symmetrical throughout, flat affect       Skin:                            Clean and dry, no rashes, no breakdown   Diagnostic Tests:  *[place], [place] [address] [phone]  ------------------------------------------------------------------- Transesophageal Echocardiography  Patient: Keiera, Strathman MR #: [id number] Study Date: 03/31/2016 Gender: F Age: 6 Height: Weight: BSA: Pt. Status: Room:  SONOGRAPHER [facility] RDCS ATTENDING [place] Lighter Ronaldo Miyamoto, Radhika REFERRING [place], [place] PERFORMING [facility], [facility]  cc:  ------------------------------------------------------------------- LV EF: 55% - 60%  ------------------------------------------------------------------- Indications: Bacteremia 790.7. Severe aortic valve stenosis.  ------------------------------------------------------------------- Study Conclusions  - Left ventricle: Systolic function was normal. The estimated ejection fraction was in the range of 55% to 60%. Wall motion was normal; there were no regional wall motion abnormalities. - Aortic valve: Trileaflet; severely calcified leaflets. Estimated AVA of 1.34 cm sq There was moderate stenosis. There was mild to moderate regurgitation directed eccentrically in the LVOT. - Aorta: There was mild to moderate diffuse atheroma. - Left atrium: No evidence of thrombus in the atrial cavity or appendage. - Right atrium: No evidence of thrombus in the atrial cavity or appendage. - Atrial septum: There was a patent foramen ovale. Doppler shunt from left to right noted also positive saline contrst bubble study at rest (valsalva not performed). - Tricuspid valve: There was moderate regurgitation. - Pulmonary arteries: PA peak pressure: 60 mm Hg (S).  Impressions:  - No evidence of endocarditis. The right ventricular systolic pressure was increased consistent with moderate  pulmonary hypertension.  ------------------------------------------------------------------- Study data: Procedure: Initial setup. The patient was brought to the laboratory. Surface ECG leads were monitored. Sedation. Conscious sedation was administered. Transesophageal echocardiography. Topical anesthesia was obtained using viscous lidocaine. A transesophageal probe was inserted by the attending cardiologist. Image quality was adequate. Study completion: The patient tolerated the procedure well. There were no complications. Diagnostic transesophageal echocardiography. 2D and color Doppler. Birthdate: Patient birthdate: 30-Jun-1924. Age: Patient is 83 yr old. Sex: Gender: female. Study date: Study date: 03/31/2016. Study time: 09:08 AM.  -------------------------------------------------------------------  ------------------------------------------------------------------- Left ventricle: Systolic function was normal. The estimated ejection fraction was in the range of 55% to 60%. Wall motion was normal; there were no regional wall motion abnormalities.  ------------------------------------------------------------------- Aortic valve: Trileaflet; severely calcified leaflets. Estimated AVA of 1.34 cm sq Cusp separation was normal. Doppler: There was moderate stenosis. There was mild to moderate regurgitation directed eccentrically in the LVOT.  ------------------------------------------------------------------- Aorta: There was moderate diffuse atheroma. There was no evidence for dissection. Aortic root: The aortic root was not dilated. Ascending aorta: The ascending aorta was normal in size. Aortic arch: The aortic arch was normal in size. Descending aorta: The descending aorta was normal in size.  ------------------------------------------------------------------- Mitral valve: Structurally normal valve. Leaflet separation was normal.  Doppler: There was trivial regurgitation.  ------------------------------------------------------------------- Left atrium: The atrium was normal in size. No evidence of thrombus in the atrial cavity or appendage. The appendage was morphologically a left appendage, multilobulated, and of normal size. Emptying velocity was normal.  ------------------------------------------------------------------- Atrial septum: Well visualized. There was a patent foramen ovale. Doppler shunt from left to right noted also positive saline contrst bubble study at rest (valsalva not performed).  -------------------------------------------------------------------  Right ventricle: The cavity size was normal. Wall thickness was normal. Systolic function was normal.  ------------------------------------------------------------------- Pulmonic valve: Structurally normal valve. Doppler: There was trivial regurgitation.  ------------------------------------------------------------------- Tricuspid valve: Structurally normal valve. Leaflet separation was normal. Doppler: There was moderate regurgitation.  ------------------------------------------------------------------- Pulmonary artery: The main pulmonary artery was mildly dilated.  ------------------------------------------------------------------- Right atrium: The atrium was normal in size. No evidence of thrombus in the atrial cavity or appendage. The appendage was morphologically a right appendage.  ------------------------------------------------------------------- Pericardium: There was no pericardial effusion.  ------------------------------------------------------------------- Measurements  Pulmonary arteries Value Reference PA pressure, S, DP (H) 60 mm Hg <=30  Legend: (L) and (H) mark values outside specified reference  range.  ------------------------------------------------------------------- Prepared and Electronically Authenticated by  Esmond Plants, MD, [facility] 2018-02-01T18:33:05   *[place], [place] [address] [phone]  ------------------------------------------------------------------- Transthoracic Echocardiography  Patient: Emerald, Shor MR #: [id number] Study Date: 03/23/2016 Gender: F Age: 86 Height: 152.4 cm Weight: 65.9 kg BSA: 1.69 m^2 Pt. Status: Room:  ATTENDING Default, Provider [phone] SONOGRAPHER [address], [place], RDCS REFERRING Esmond Plants, MD, [facility] ORDERING Gollan, Tim REFERRING Gollan, Tim PERFORMING [facility]  cc:  ------------------------------------------------------------------- LV EF: 65% - 70%  ------------------------------------------------------------------- History: PMH: Acquired from the patient and from the patient&'s chart. Fatigue, weakness, dyspnea, and bilateral lower extremity edema. Risk factors: Lifelong nonsmoker. Hypertension. Diabetes mellitus. Dyslipidemia.  ------------------------------------------------------------------- Study Conclusions  - Left ventricle: The cavity size was at the upper limits of normal. Wall thickness was increased in a pattern of moderate LVH. Systolic function was vigorous. The estimated ejection fraction was in the range of 65% to 70%. Wall motion was normal; there were no regional wall motion abnormalities. Doppler parameters are consistent with abnormal left ventricular relaxation (grade 1 diastolic dysfunction). Doppler parameters are consistent with high ventricular filling pressure. - Aortic valve: Trileaflet; severely thickened, moderately calcified leaflets. Transvalvular velocity  was increased. There was severe stenosis. There was mild to moderate regurgitation. Valve area (VTI): 0.94 cm^2. Valve area (Vmax): 0.96 cm^2. Valve area (Vmean): 0.93 cm^2. - Mitral valve: Calcified annulus. Mildly thickened leaflets . There was mild regurgitation. - Left atrium: The atrium was mildly dilated. - Right ventricle: The cavity size was normal. Systolic function was normal. - Atrial septum: There was an atrial septal aneurysm. - Pulmonary arteries: Systolic pressure was severely increased. PA peak pressure: 75 mm Hg (S). - Pericardium, extracardiac: A trivial pericardial effusion was identified.  ------------------------------------------------------------------- Study data: The previous study was not available, so comparison was made to the report of 09/11/2014. Study status: Routine. Procedure: Transthoracic echocardiography. Image quality was good. Study completion: There were no complications. Transthoracic echocardiography. M-mode, complete 2D, spectral Doppler, and color Doppler. Birthdate: Patient birthdate: 1924/05/26. Age: Patient is 83 yr old. Sex: Gender: female. BMI: 28.4 kg/m^2. Blood pressure: 140/82 Patient status: Outpatient. Study date: Study date: 03/23/2016. Study time: 11:40 AM.  -------------------------------------------------------------------  ------------------------------------------------------------------- Left ventricle: The cavity size was at the upper limits of normal. Wall thickness was increased in a pattern of moderate LVH. Systolic function was vigorous. The estimated ejection fraction was in the range of 65% to 70%. Wall motion was normal; there were no regional wall motion abnormalities. Doppler parameters are consistent with abnormal left ventricular relaxation (grade 1 diastolic dysfunction). Doppler parameters are consistent with high ventricular filling pressure. Global longitudinal  strain is -21.8%.  ------------------------------------------------------------------- Aortic valve: Trileaflet; severely thickened, moderately calcified leaflets. Cusp separation was reduced. Doppler: Transvalvular velocity was increased. There was severe  stenosis. There was mild to moderate regurgitation. VTI ratio of LVOT to aortic valve: 0.33. Valve area (VTI): 0.94 cm^2. Indexed valve area (VTI): 0.56 cm^2/m^2. Peak velocity ratio of LVOT to aortic valve: 0.34. Valve area (Vmax): 0.96 cm^2. Indexed valve area (Vmax): 0.57 cm^2/m^2. Mean velocity ratio of LVOT to aortic valve: 0.33. Valve area (Vmean): 0.93 cm^2. Indexed valve area (Vmean): 0.55 cm^2/m^2. Mean gradient (S): 60 mm Hg. Peak gradient (S): 97 mm Hg.  ------------------------------------------------------------------- Aorta: Aortic root: The aortic root was normal in size. Ascending aorta: The ascending aorta was normal in size.  ------------------------------------------------------------------- Mitral valve: Calcified annulus. Mildly thickened leaflets . Leaflet separation was normal. Doppler: Transvalvular velocity was within the normal range. There was no evidence for stenosis. There was mild regurgitation. Peak gradient (D): 8 mm Hg.  ------------------------------------------------------------------- Left atrium: The atrium was mildly dilated.  ------------------------------------------------------------------- Atrial septum: The interatrial septum was hypermobile. There was an atrial septal aneurysm.  ------------------------------------------------------------------- Right ventricle: The cavity size was normal. Systolic function was normal.  ------------------------------------------------------------------- Pulmonic valve: Structurally normal valve. Cusp separation was normal. Doppler: Transvalvular velocity was within the normal range. There was no  regurgitation.  ------------------------------------------------------------------- Tricuspid valve: Structurally normal valve. Leaflet separation was normal. Doppler: Transvalvular velocity was within the normal range. There was mild regurgitation.  ------------------------------------------------------------------- Pulmonary artery: Systolic pressure was severely increased.  ------------------------------------------------------------------- Right atrium: The atrium was normal in size.  ------------------------------------------------------------------- Pericardium: A trivial pericardial effusion was identified.  ------------------------------------------------------------------- Systemic veins: Inferior vena cava: The vessel was normal in size. The respirophasic diameter changes were in the normal range (>= 50%), consistent with normal central venous pressure.  ------------------------------------------------------------------- Measurements  Left ventricle Value Reference LV ID, ED, PLAX chordal 45.5 mm 43 - 52 LV ID, ES, PLAX chordal 25.2 mm 23 - 38 LV fx shortening, PLAX chordal 45 % >=29 LV PW thickness, ED 13.4 mm ---------- IVS/LV PW ratio, ED 1.19 <=1.3 Stroke volume, 2D 101 ml ---------- Stroke volume/bsa, 2D 60 ml/m^2 ---------- LV ejection fraction, 1-p A4C 80 % ---------- LV end-diastolic volume, 2-p 51 ml ---------- LV end-systolic volume, 2-p 9 ml ---------- LV ejection fraction, 2-p 82 % ---------- Stroke volume, 2-p 42 ml ---------- LV end-diastolic volume/bsa, 2-p 30 ml/m^2  ---------- LV end-systolic volume/bsa, 2-p 5 ml/m^2 ---------- Stroke volume/bsa, 2-p 24.7 ml/m^2 ---------- LV e&', lateral 6.85 cm/s ---------- LV E/e&', lateral 20.15 ---------- LV e&', medial 3.81 cm/s ---------- LV E/e&', medial 36.22 ---------- LV e&', average 5.33 cm/s ---------- LV E/e&', average 25.89 ---------- Longitudinal strain, TDI 22 % ----------  Ventricular septum Value Reference IVS thickness, ED 15.9 mm ----------  LVOT Value Reference LVOT ID, S 19 mm ---------- LVOT area 2.84 cm^2 ---------- LVOT peak velocity, S 167 cm/s ---------- LVOT mean velocity, S 121 cm/s ---------- LVOT VTI, S 35.4 cm ---------- LVOT peak gradient, S 11 mm Hg ----------  Aortic valve Value Reference Aortic valve peak velocity, S 493 cm/s ---------- Aortic valve mean velocity, S 370 cm/s ---------- Aortic valve VTI, S 107 cm ---------- Aortic mean gradient, S 60 mm Hg ---------- Aortic peak gradient, S 97 mm Hg ---------- VTI ratio, LVOT/AV 0.33 ---------- Aortic valve area, VTI 0.94 cm^2 ---------- Aortic valve area/bsa, VTI 0.56 cm^2/m^2 ---------- Velocity ratio, peak, LVOT/AV 0.34  ---------- Aortic valve area, peak velocity 0.96 cm^2 ---------- Aortic valve area/bsa, peak 0.57 cm^2/m^2 ---------- velocity Velocity ratio, mean, LVOT/AV 0.33 ---------- Aortic valve area, mean velocity 0.93 cm^2 ---------- Aortic valve area/bsa, mean 0.55 cm^2/m^2 ---------- velocity Aortic regurg pressure  half-time 267 ms ----------  Aorta Value Reference Aortic root ID, ED 30 mm ---------- Ascending aorta ID, A-P, S 34 mm ----------  Left atrium Value Reference LA ID, A-P, ES 39 mm ---------- LA ID/bsa, A-P (H) 2.3 cm/m^2 <=2.2 LA volume, S 58.2 ml ---------- LA volume/bsa, S 34.4 ml/m^2 ---------- LA volume, ES, 1-p A4C 61.1 ml ---------- LA volume/bsa, ES, 1-p A4C 36.1 ml/m^2 ---------- LA volume, ES, 1-p A2C 50.9 ml ---------- LA volume/bsa, ES, 1-p A2C 30.1 ml/m^2 ----------  Mitral valve Value Reference Mitral E-wave peak velocity 138 cm/s ---------- Mitral A-wave peak velocity 197 cm/s ---------- Mitral deceleration time (L) 116 ms 150 - 230 Mitral peak gradient, D 8 mm Hg ---------- Mitral E/A ratio, peak 0.7 ----------  Pulmonary arteries Value Reference PA pressure, S, DP (H) 75 mm Hg <=30  Tricuspid valve Value Reference Tricuspid peak RV-RA gradient 53  mm Hg ----------  Right atrium Value Reference RA ID, S-I, ES, A4C (H) 53.9 mm 34 - 49 RA area, ES, A4C 13.9 cm^2 8.3 - 19.5 RA volume, ES, A/L 29.3 ml ---------- RA volume/bsa, ES, A/L 17.3 ml/m^2 ----------  Right ventricle Value Reference TAPSE 34.4 mm ---------- RV s&', lateral, S 11.7 cm/s ----------  Pulmonic valve Value Reference Pulmonic valve peak velocity, S 143 cm/s ---------- Pulmonic regurg velocity, ED 155 cm/s ---------- Pulmonic regurg gradient, ED 10 mm Hg ----------  Legend: (L) and (H) mark values outside specified reference range.  ------------------------------------------------------------------- Prepared and Electronically Authenticated by  Nelva Bush, MD 2018-01-24T16:36:10      Physical Therapy Evaluation  Patient Details Name: Elayna Tobler MRN: [id number] Date of Birth: [birth year] Referring Provider: Dr. [name]  Encounter Date: 10/11/2016              PT End of Session - 10/11/16 1414   Visit Number 1   PT Start Time 1410   PT Stop Time 1509   PT Time Calculation (min) 59 min   Equipment Utilized During Treatment Gait belt          Past Medical History:  Diagnosis Date  . Anemia   . Bone disease   . Carotid arterial disease ([place])    a. 08/2014 U/S: Bilateral <50% stenosis. Patent vertebrals w/ antegrade flow.   . Cervical spine arthritis ([place])   . Chronic diastolic CHF (congestive heart failure) ([place])    a. echo 2014: EF 55-60%, DD; b. 08/2014 Echo: EF 55-60%, no RWMA, GR1DD; c. 02/2016 Echo: EF 65-70%, Gr1 DD.  Marland Kitchen Cognitive  impairment   . Depression   . Diabetes mellitus without complication ([place])   . Essential hypertension   . GERD (gastroesophageal reflux disease)   . Glaucoma   . Gout   . Hiatal hernia   . History of renal impairment   . Hyperlipidemia   . Hypotension    a. Related to Norvasc - 11/2014 ED visit.  . Multinodular goiter    a. Noted incidentally on CT 07/2012 and carotid U/S 08/2014; b. Nl TSH 11/2014.  Marland Kitchen Paroxysmal SVT (supraventricular tachycardia) (H[facility])    a. on Toprol   . Prolapsed uterus   . Severe aortic stenosis    a. echo 08/2014: EF 55-60%, no RWMA, GR1DD, severe AS, mild AI, Mean gradient (S): 38 mm Hg. Valve area (VTI): 0.78 cm^2., mild-mod MR, LA mildly dilated, atrial septal aneurysm, mild TR; b. 02/2016 Echo: EF 65-70%, Gr1 DD, sev AS, mild to mod AI, Valve area (VTI): 0.94 cm^2, (Vmax) 0.96 cm^2, (  Vmean) 0.93cm^2, mild MR, mildly dil LA, nl RV fxn, PASP 6mmHg; c. 03/2016 TEE: EF 55-60, mod AS.  Marland Kitchen Stroke [facility])    a. CT head 07/2014 showed small thalamic infarct  . Vitamin deficiency          Past Surgical History:  Procedure Laterality Date  . TEE WITHOUT CARDIOVERSION N/A 03/31/2016   Procedure: TRANSESOPHAGEAL ECHOCARDIOGRAM (TEE); Surgeon: Minna Merritts, MD; Location: [facility] ORS; Service: Cardiovascular; Laterality: N/A;  . TUBAL LIGATION      There were no vitals filed for this visit.               Subjective Assessment - 10/11/16 1417   Subjective Pt/daughter report patient has not returned back to normal since her January hospitalization. Prior to that patient was working in the yard, travelling to [employer], shopping, Social research officer, government. Pt is now limited to household activities and going out for MD appointments.    Patient Stated Goals hopes to return to yardwork and PLOF   Currently in Pain? No/denies                  [facility] PT Assessment - 10/11/16 0001           Assessment   Medical Diagnosis  severe aortic stenosis   Referring Provider Dr. [name]   Onset Date/Surgical Date --  Jan 2018       Precautions   Precautions None       Restrictions   Weight Bearing Restrictions No       Balance Screen   Has the patient fallen in the past 6 months No   Has the patient had a decrease in activity level because of a fear of falling?  No   Is the patient reluctant to leave their home because of a fear of falling?  No       Home Ecologist residence   Living Arrangements Alone  daughters provide 24/7 care   [place] to enter   Entrance Stairs-Number of Steps 3   Entrance Stairs-Rails Left   [place] One level       Posture/Postural Control   Posture/Postural Control Postural limitations   Postural Limitations Forward head;Rounded Shoulders       ROM / Strength   AROM / PROM / Strength AROM;Strength       AROM   Overall AROM Comments shoulder flexion limited by 25-50%, otherwise [facility]       Strength   Overall Strength Comments grossly 4+/5 on R side and 4-/5 on left side   Strength Assessment Site Hand   Right/Left hand Right;Left   Right Hand Grip (lbs) 18   Left Hand Grip (lbs) unable  L hand dominant       Ambulation/Gait   Gait Comments Pt ambulates without a device with CGA, decreased step length bil and severe forward head posture/rounded shoulders. Pt's gait distance was limited by 72% for age/gender.                  [facility] Pre-Surgical Assessment - 10/11/16 0001   5 Meter Walk Test- trial 1 10 sec   5 Meter Walk Test- trial 2 11 sec.   5 Meter Walk Test- trial 3 8 sec.   5 meter walk test average 9.67 sec   4 Stage Balance Test tolerated for:  10 sec.   4 Stage Balance Test Position 2   Comment needs UE support  ADL/IADL Needs Assistance with: Bathing;Dressing;Meal prep;Finances;Yard work   ADL/IADL Therapist, sports Index Midly frail    6 Minute Walk- Baseline yes   BP (mmHg) 178/46   HR (bpm) 79   02 Sat (%RA) 99 %   Modified Borg Scale for Dyspnea 0- Nothing at all   Perceived Rate of Exertion (Borg) 6-   6 Minute Walk Post Test yes   BP (mmHg) 196/52  170/46 after 5 minutes   HR (bpm) 114   02 Sat (%RA) 96 %   Modified Borg Scale for Dyspnea 4- somewhat severe   Perceived Rate of Exertion (Borg) 11- Fairly light   Aerobic Endurance Distance Walked 360      Objective measurements completed on examination: See above findings.               PT Education - Oct 22, 2016 1533   Education provided Yes   Education Details fall risk, considered trialing SPC with HH PT   Person(s) Educated Patient;Child(ren)   Methods Explanation   Comprehension Verbalized understanding                     Plan - 10-22-16 1533   Clinical Impression Statement see below   PT Frequency One time visit     Clinical Impression Statement: Pt is amotivated 83yo femalepresenting to OP PT for evaluation prior to possible TAVR surgery due to severe aortic stenosis. Pt reports onset of a general decline in function after her hospitalization in Jan 2018. She was hospitalized again in July for 4 days due to heart failure, underwent therapy at a SNF and is now receiving home health services. She continues to get stronger but is limited by shortness of breath and fatigue. She has a great support system. Patient lives alone but daughters provided 24/7 care. She has an aide that bathes her. Her daughters assist with meals, finances, etc. Her L hand is swollen and hypersensitive and this causes her to need a lot of assistance with household activities. Pt ambulates independently in her house and with supervision min A when leaving the house. Pt presents with fairROM and strength- her L side is slightly weaker than her R throughout and has decreased coordination, poorbalance and is at high  fall risk 4 stage balance test, slowwalking speed and pooraerobic endurance per 6 minute walk test. Pt ambulated 152feet in 2:45before requesting a seated rest beak lasting 1:00. At time of rest, patient's HR was 107bpm and O2 was 98on room air. Pt able to resume after rest and ambulate an additional 137feet. Pt ambulated a total of 327feet in 6 minute walk. BPincreased from 178/46 at rest to 196/526 minute walk test.It recovered to 170/46 after 5 minutes. Based on the Short Physical Performance Battery, patient has a frailty rating of 3/12 with </= 5/12 considered frail. I anticipate with continued HHPT, pt will continue to improve her functional mobility.   Patient demonstratedthe following deficits and impairments:   Visit Diagnosis: Other abnormalities of gait and mobility  Abnormal posture  Muscle weakness (generalized)              G-Codes - Oct 22, 2016 1534   Functional Assessment Tool Used (Outpatient Only) 6 minute walk 360'   Functional Limitation Mobility: Walking and moving around   Mobility: Walking and Moving Around Current Status [phone]) At least 60 percent but less than 80 percent impaired, limited or restricted   Mobility: Walking and Moving Around Goal Status ([id number]) At least 60 percent but less than  80 percent impaired, limited or restricted   Mobility: Walking and Moving Around Discharge Status [phone]) At least 60 percent but less than 80 percent impaired, limited or restricted       Problem List     Patient Active Problem List   Diagnosis Date Noted  . Bitter taste 09/26/2016  . Cognitive impairment 09/26/2016  . Glaucoma 09/26/2016  . Gout 09/26/2016  . Hand pain, left 09/26/2016  . Headache 09/26/2016  . Hiatal hernia with gastroesophageal reflux 09/26/2016  . Hypokalemia 09/26/2016  . Left arm pain 09/26/2016  . Lumbar and sacral spondylarthritis 09/26/2016  . Multiple lacunar infarcts ([place]) 09/26/2016  . Multiple  pulmonary nodules 09/26/2016  . No appetite 09/26/2016  . Obesity 09/26/2016  . Recurrent UTI 09/26/2016  . Shoulder pain, bilateral 09/26/2016  . Trigeminal neuralgia 09/26/2016  . Urinary leakage 09/26/2016  . Uterine prolapse 09/26/2016  . Vitamin D deficiency 09/26/2016  . Malnutrition ([place]) 08/30/2016  . Anemia due to unknown mechanism 08/30/2016  . Acute respiratory distress 08/30/2016  . Acute on chronic diastolic CHF (congestive heart failure) ([place]) 08/29/2016  . Aortic stenosis 05/03/2016  . Enterococcal bacteremia 03/30/2016  . Viral upper respiratory tract infection   . Acute back pain   . Sepsis ([place])   . Elevated troponin   . CAP (community acquired pneumonia) 03/28/2016  . Fatigue 12/14/2015  . Frequency of urination 12/14/2015  . Osteoporosis 10/29/2015  . Leg swelling 09/14/2015  . Bilateral lower extremity edema 07/07/2015  . Hypertension   . Hyperlipidemia   . Diabetes mellitus without complication ([place])   . PSVT (paroxysmal supraventricular tachycardia) ([place])   . Multiple thyroid nodules   . Carotid arterial disease ([place])   . Hypotension   . Sleepiness 12/25/2014  . Hospital discharge follow-up 09/24/2014  . NSTEMI (non-ST elevated myocardial infarction) ([place]) 09/10/2014  . Carotid atherosclerosis, bilateral 09/10/2014  . Renal insufficiency 08/28/2014  . Bladder prolapse, female, acquired 08/28/2014  . Confusion 08/28/2014  . Hydronephrosis, bilateral 08/28/2014  . Medicare annual wellness visit, subsequent 07/23/2014  . Weakness 07/19/2014  . Diarrhea 06/16/2014  . Diabetic polyneuropathy associated with type 2 diabetes mellitus ([place]) 03/10/2014  . Hammer toe of left foot 03/10/2014  . Hammer toe of right foot 03/10/2014  . Metatarsalgia of left foot 03/10/2014  . Metatarsalgia of right foot 03/10/2014  . Onychogryphosis 03/10/2014  . Orofacial dyskinesia 02/28/2013  . SVT (supraventricular tachycardia) ([place]) 12/17/2012  . Diabetes  type 2, controlled ([place]) 12/17/2012  . SOB (shortness of breath) 12/17/2012  . Essential hypertension, malignant 12/17/2012  . Chronic diastolic CHF (congestive heart failure) ([place]) 12/17/2012  . Hypomagnesemia 09/22/2012  . Weight loss 09/12/2012  . Cervical spine arthritis ([place]) 06/29/2010    [place], PT 10/11/2016, 3:35 PM  [facility] [address] [place], [place], [address] Phone: [phone]: [phone]  Name: Endia Moncur MRN: [id number] Date of Birth: 13-Apr-1924     Electronically signed by London Pepper, PT at 10/11/2016 4:35 PM         Physicians   Panel Physicians Referring Physician Case Authorizing Physician  [name], MD (Primary)    Procedures   RIGHT/LEFT HEART CATH AND CORONARY ANGIOGRAPHY  Conclusion   1. Patent coronary arteries with the exception of severe stenosis in the right PDA. There is mild nonobstructive stenosis of the LAD and RCA and a widely patent left main and left circumflex 2. Moderate to severe pulmonary hypertension with pulmonary artery pressure of 74/21  with a mean of 45 and a pulmonary capillary wedge pressure of 23 with a 32 mmHg T-wave 3. Mean transaortic valve gradient of 17 mmHg and calculated aortic valve area of 1.0 cm, suspect contamination of the pressure gradient with use of an AL-1 catheter and difficult measurement because of frequent ectopy  Recommend: Continued evaluation for TAVR. The patient's coronary disease is appropriate for medical therapy. She does have hemodynamics consistent with both left and right-sided congestive heart failure with elevated pulmonary wedge pressure and pulmonary hypertension. Her echo study is diagnostic of severe aortic stenosis.  Indications   Severe aortic stenosis [I35.0 (ICD-10-CM)]  Procedural Details/Technique   Technical Details INDICATION: Severe aortic stenosis, pre-TAVR  evaluation  PROCEDURAL DETAILS: There was an indwelling IV in a right antecubital vein. Using normal sterile technique, the IV was changed out for a 5 Fr brachial sheath over a 0.018 inch wire. The right wrist was then prepped, draped, and anesthetized with 1% lidocaine. Using the modified Seldinger technique a 5/6 French Slender sheath was placed in the right radial artery. Intra-arterial verapamil was administered through the radial artery sheath. IV heparin was administered after a JR4 catheter was advanced into the central aorta. A Swan-Ganz catheter was used for the right heart catheterization. A venogram was performed because it was difficult to pass the Swan-Ganz catheter into the heart. The catheter was advanced over an angled Glidewire. Standard protocol was followed for recording of right heart pressures and sampling of oxygen saturations. Fick cardiac output was calculated. Standard Judkins catheters were used for selective coronary angiography. The aortic valve was crossed with an AL-1 catheter directing a straight tip wire. LV pressure was recorded with the AL-1 catheter. A pullback was performed across the aortic valve. There were no immediate procedural complications. The patient was transferred to the post catheterization recovery area for further monitoring.     Estimated blood loss <50 mL.  During this procedure the patient was administered the following to achieve and maintain moderate conscious sedation: Versed 0.5 mg, while the patient's heart rate, blood pressure, and oxygen saturation were continuously monitored. The period of conscious sedation was 41 minutes, of which I was present face-to-face 100% of this time.    Coronary Findings   Dominance: Right  Left Main  Vessel is angiographically normal.  Left Anterior Descending  There is mild the vessel.  Dist LAD lesion, 50% stenosed. The lesion is calcified.  Left Circumflex  The vessel exhibits minimal luminal  irregularities.  First Obtuse Marginal Branch  The vessel exhibits minimal luminal irregularities.  Right Coronary Artery  There is mild the vessel.  Mid RCA lesion, 40% stenosed. The lesion is calcified.  Right Posterior Descending Artery  RPDA lesion, 80% stenosed. The lesion is calcified.  Left Heart   Aortic Valve The aortic valve is calcified. There is restricted aortic valve motion.    Coronary Diagrams   Diagnostic Diagram       Implants     No implant documentation for this case.  PACS Images   Show images for CARDIAC CATHETERIZATION   Link to Procedure Log   Procedure Log    Hemo Data    Most Recent Value  Fick Cardiac Output 4.93 L/min  Fick Cardiac Output Index 3.24 (L/min)/BSA  Aortic Mean Gradient 17.4 mmHg  Aortic Peak Gradient 15 mmHg  Aortic Valve Area 1.03  Aortic Value Area Index 0.68 cm2/BSA  RA A Wave 11 mmHg  RA V Wave 6 mmHg  RA Mean 6  mmHg  RV Systolic Pressure 72 mmHg  RV Diastolic Pressure 2 mmHg  RV EDP 7 mmHg  PA Systolic Pressure 74 mmHg  PA Diastolic Pressure 21 mmHg  PA Mean 45 mmHg  PW A Wave 25 mmHg  PW V Wave 32 mmHg  PW Mean 23 mmHg  AO Systolic Pressure 614 mmHg  AO Diastolic Pressure 47 mmHg  AO Mean 92 mmHg  LV Systolic Pressure 431 mmHg  LV Diastolic Pressure 17 mmHg  LV EDP 22 mmHg  Arterial Occlusion Pressure Extended Systolic Pressure 540 mmHg  Arterial Occlusion Pressure Extended Diastolic Pressure 51 mmHg  Arterial Occlusion Pressure Extended Mean Pressure 96 mmHg  Left Ventricular Apex Extended Systolic Pressure 086 mmHg  Left Ventricular Apex Extended Diastolic Pressure 12 mmHg  Left Ventricular Apex Extended EDP Pressure 38 mmHg  QP/QS 1  TPVR Index 13.88 HRUI  TSVR Index 28.38 HRUI  PVR SVR Ratio 0.26  TPVR/TSVR Ratio 0.49      ADDENDUM REPORT: 11/04/2016 16:30  EXAM: OVER-READ INTERPRETATION  CT CHEST  The following report is an over-read performed by radiologist Dr. Rebekah Chesterfield  [facility] on 11/04/2016. This over-read does not include interpretation of cardiac or coronary anatomy or pathology. The coronary CTA interpretation by the cardiologist is attached.  COMPARISON:  None.  FINDINGS: Described separately under dictation for contemporaneously obtained CTA of the chest, abdomen and pelvis.  IMPRESSION: Please see separate dictation for contemporaneously obtained CTA of the chest, abdomen and pelvis for full description of relevant extracardiac findings.   Electronically Signed   By: Vinnie Langton M.D.   On: 11/04/2016 16:30   Addended by Etheleen Mayhew, MD on 11/04/2016 4:32 PM    Study Result   CLINICAL DATA:  Aortic Stenosis  EXAM: Cardiac TAVR CT  TECHNIQUE: The patient was scanned on a Siemens Force 761 slice scanner. A 120 kV retrospective scan was triggered in the ascending thoracic aorta at 140 HU's. Gantry rotation speed was 250 msecs and collimation was .6 mm. No beta blockade or nitro were given. The 3D data set was reconstructed in 5% intervals of the R-R cycle. Systolic and diastolic phases were analyzed on a dedicated work station using MPR, MIP and VRT modes. The patient received 80 cc of contrast.  FINDINGS: Aortic Valve:  Tri leaflet and severely calcified  Aorta: Mild aortic root dilatation Normal arch vessels moderate calcified aortic debris in arch and descending thoracic aorta  Sino-tubular Junction:  30 mm  Ascending Thoracic Aorta:  38 mm  Aortic Arch:  27 mm  Descending Thoracic Aorta:  25 mm  Sinus of Valsalva Measurements:  Non-coronary:  29 mm  Right - coronary:  30.8 mm  Left -   coronary:  30.5 mm  Coronary Artery Height above Annulus:  Left Main:  Shallow only 10 mm above annulus  Right Coronary:  11.6 mm  Virtual Basal Annulus Measurements:  Maximum / Minimum Diameter:  26.2 mm x 19.3 mm  Perimeter:  75 mm  Area:  408 mm2  Coronary Arteries:   Shallow LM ostium above annulus  Optimum Fluoroscopic Angle for Delivery: LAO 12 degrees Caudal 11 degrees  IMPRESSION: 1) Severely calcified tri leaflet aortic valve with annular area suitable for a 23 mm Sapien 3 valve. Perimeter measures for 29 mm Evolut Pro but shallow LM and Sinus height less than 15 mm would suggest 26 mm valve would be better  2) No LAA thrombus  3) Optimum angle for deployment LAO 12 degrees  Caudal 11 degrees  4) Shallow LM height above annulus 10 mm  5) Moderate calcific aortic debris in arch and descending thoracic aorta  Jenkins Rouge  Electronically Signed: By: Jenkins Rouge M.D. On: 11/03/2016 16:16       CLINICAL DATA:  83 year old female with history of severe aortic stenosis. Preprocedural study prior to potential transcatheter aortic valve replacement (TAVR) procedure.  EXAM: CT ANGIOGRAPHY CHEST, ABDOMEN AND PELVIS  TECHNIQUE: Multidetector CT imaging through the chest, abdomen and pelvis was performed using the standard protocol during bolus administration of intravenous contrast. Multiplanar reconstructed images and MIPs were obtained and reviewed to evaluate the vascular anatomy.  CONTRAST:  100 mL of Isovue 370.  COMPARISON:  None.  FINDINGS: CTA CHEST FINDINGS  Cardiovascular: Heart size is enlarged. There is no significant pericardial fluid, thickening or pericardial calcification. Severe thickening calcification of the aortic valve. There is aortic atherosclerosis, as well as atherosclerosis of the great vessels of the mediastinum and the coronary arteries, including calcified atherosclerotic plaque in the left anterior descending, left circumflex and right coronary arteries.  Mediastinum/Lymph Nodes: No pathologically enlarged mediastinal or hilar lymph nodes. Esophagus is unremarkable in appearance. No axillary lymphadenopathy.  Lungs/Pleura: A few scattered tiny pulmonary nodules  noted throughout the lungs bilaterally, the largest of which measures 6 x 4 mm (mean diameter 5 mm) 80 apex of the left upper lobe (axial image 18 of series 16). No acute consolidative airspace disease. Small left pleural effusion. Small amount of pleural fluid loculated in the superior aspect of the left major fissure.  Musculoskeletal/Soft Tissues: There are no aggressive appearing lytic or blastic lesions noted in the visualized portions of the skeleton.  CTA ABDOMEN AND PELVIS FINDINGS  Hepatobiliary: No cystic or solid hepatic lesions. No intra or extrahepatic biliary ductal dilatation. Gallbladder is normal in appearance.  Pancreas: No pancreatic mass. No pancreatic ductal dilatation. No pancreatic or peripancreatic fluid or inflammatory changes.  Spleen: Unremarkable.  Adrenals/Urinary Tract: Bilateral kidneys and bilateral adrenal glands are normal in appearance. No hydroureteronephrosis. Urinary bladder is normal in appearance.  Stomach/Bowel: Normal appearance of the stomach. No pathologic dilatation of small bowel or colon. Numerous colonic diverticulae are noted, without surrounding inflammatory changes to suggest an acute diverticulitis at this time. The appendix is not confidently identified and may be surgically absent. Regardless, there are no inflammatory changes noted adjacent to the cecum to suggest the presence of an acute appendicitis at this time.  Vascular/Lymphatic: Aortic atherosclerosis, with vascular findings and measurements pertinent to potential TAVR procedure, as detailed below. No aneurysm or dissection noted in the abdominal or pelvic vasculature. Moderate stenosis of the ostium of the celiac axis. Mild stenosis of the ostium of the superior mesenteric artery. Inferior mesenteric artery is widely patent. Renal arteries bilaterally appear patent without hemodynamically significant stenosis. No lymphadenopathy noted in the abdomen or  pelvis.  Reproductive: Uterus and ovaries are atrophic.  Other: Trace volume of ascites.  No pneumoperitoneum.  Musculoskeletal: There are no aggressive appearing lytic or blastic lesions noted in the visualized portions of the skeleton.  VASCULAR MEASUREMENTS PERTINENT TO TAVR:  AORTA:  Minimal Aortic Diameter -  13 x 13 mm  Severity of Aortic Calcification -  mild to moderate  RIGHT PELVIS:  Right Common Iliac Artery -  Minimal Diameter - 8.9 x 6.0 mm  Tortuosity - mild  Calcification - mild moderate  Right External Iliac Artery -  Minimal Diameter - 7.6 x 7.6 mm  Tortuosity - mild  Calcification -  none  Right Common Femoral Artery -  Minimal Diameter - 7.2 x 6.1 mm  Tortuosity - mild  Calcification - mild  LEFT PELVIS:  Left Common Iliac Artery -  Minimal Diameter - 8.8 x 8.0 mm  Tortuosity - mild  Calcification - mild to moderate  Left External Iliac Artery -  Minimal Diameter - 6.9 x 6.7 mm  Tortuosity - mild  Calcification - none  Left Common Femoral Artery -  Minimal Diameter - 6.0 x 5.8 mm  Tortuosity - mild  Calcification - none  Review of the MIP images confirms the above findings.  IMPRESSION: 1. Vascular findings and measurements pertinent to potential TAVR procedure, as detailed above. This patient does have suitable pelvic arterial access bilaterally. 2. Severe thickening calcification of the aortic valve, compatible with the reported clinical history of severe aortic stenosis. 3. Small left pleural effusion. 4. A few scattered tiny pulmonary nodules are noted in the lungs bilaterally measuring 5 mm or less in size. These are nonspecific, but are statistically likely benign. No follow-up needed if patient is low-risk (and has no known or suspected primary neoplasm). Non-contrast chest CT can be considered in 12 months if patient is high-risk. This recommendation follows the consensus  statement: Guidelines for Management of Incidental Pulmonary Nodules Detected on CT Images: From the Fleischner Society 2017; Radiology 2017; 284:228-243. 5. Cardiomegaly. 6. Colonic diverticulosis without evidence to suggest an acute diverticulitis at this time. 7. Additional incidental findings, as above. Aortic Atherosclerosis (ICD10-I70.0).   Electronically Signed   By: Vinnie Langton M.D.   On: 11/04/2016 14:50   STS Risk Calculator: Risk of Mortality: 6.689%  Morbidity or Mortality: 33.11%  Long Length of Stay: 21.961%  Short Length of Stay: 9.334%  Permanent Stroke: 6.015%  Prolonged Ventilation: 25.303%  DSW Infection: 0.117%  Renal Failure: 12.166%  Reoperation: 12.184%   Impression:  This 83 year old woman has stage D, severe, symptomatic aortic stenosis with NYHA class 3 symptoms of exertional shortness of breath and fatigue and recurrent admissions for acute on chronic diastolic heart failure with worsening shortness of breath and peripheral edema. She is frail as noted in her PT evaluation and I don't think she is a candidate for open surgical AVR due to her advanced age and frailty. TAVR may be a reasonable alternative although with her degree of frailty at her age I don't know if she will make a good functional recovery. Her gated cardiac CT shows anatomy suitable for a Sapien 3 valve. Her abdominal and pelvic CT shows vasculature suitable for transfemoral insertion. The patient and her daughter were counseled at length regarding treatment alternatives for management of severe symptomatic aortic stenosis. The risks and benefits of surgical intervention has been discussed in detail. Long-term prognosis with medical therapy was discussed. Alternative approaches such as conventional surgical aortic valve replacement, transcatheter aortic valve replacement, and palliative medical therapy were compared and contrasted at length. This discussion was placed in the context of  the patient's own specific clinical presentation and past medical history. All of their questions been addressed. The patient and her daughter are eager to proceed with surgical management as soon as possible. Her daughter says that she was quite active until January 2018 and she feels like her mother can get back to that level if her aortic stenosis is treated.   We discussed complications that might develop including but not limited to risks of death, stroke, paravalvular leak, aortic dissection or other major vascular complications, aortic annulus  rupture, device embolization, cardiac rupture or perforation, mitral regurgitation, acute myocardial infarction, arrhythmia, heart block or bradycardia requiring permanent pacemaker placement, congestive heart failure, respiratory failure, renal failure, pneumonia, infection, other late complications related to structural valve deterioration or migration, or other complications that might ultimately cause a temporary or permanent loss of functional independence or other long term morbidity. The patient provides full informed consent for the procedure as described and all questions were answered.     Plan:  Transfemoral TAVR   Gaye Pollack, MD

## 2016-11-14 NOTE — Pre-Procedure Instructions (Signed)
[name]  11/14/2016    Your procedure is scheduled on Tuesday, September 18.  Report to [facility] Admitting at 8:00 A.M.   Call this number if you have problems the morning of surgery: [phone]    Remember:  Do not eat food or drink liquids after midnight.  Take these medicines the morning of surgery with A SIP OF WATER : allopurinol (ZYLOPRIM) gabapentin (NEURONTIN)  sertraline (ZOLOFT) pantoprazole (PROTONIX) isosorbide mononitrate (IMDUR)  May use eye drops.   May take if needed:  tiZANidine (ZANAFLEX)  LORazepam (ATIVAN)  HYDROcodone-acetaminophen (NORCO/VICODIN)  Special instructions:  [place]- Preparing For Surgery  Before surgery, you can play an important role. Because skin is not sterile, your skin needs to be as free of germs as possible. You can reduce the number of germs on your skin by washing with CHG (chlorahexidine gluconate) Soap before surgery.  CHG is an antiseptic cleaner which kills germs and bonds with the skin to continue killing germs even after washing.  Please do not use if you have an allergy to CHG or antibacterial soaps. If your skin becomes reddened/irritated stop using the CHG.  Do not shave (including legs and underarms) for at least 48 hours prior to first CHG shower. It is OK to shave your face.  Please follow these instructions carefully.   1. Shower the NIGHT BEFORE SURGERY and the MORNING OF SURGERY with CHG.   2. If you chose to wash your hair, wash your hair first as usual with your normal shampoo.  3. After you shampoo, rinse your hair and body thoroughly to remove the shampoo.  Wash your face and private area.  4. Use CHG as you would any other liquid soap. You can apply CHG directly to the skin and wash gently with a scrungie or a clean washcloth.   5. Apply the CHG Soap to your body ONLY FROM THE NECK DOWN.  Do not use on open wounds or open sores. Avoid contact with your eyes, ears, mouth  and genitals (private parts). Wash genitals (private parts) with your normal soap.  6. Wash thoroughly, paying special attention to the area where your surgery will be performed.  7. Thoroughly rinse your body with warm water from the neck down.  8. DO NOT shower/wash with your normal soap after using and rinsing off the CHG Soap.  9. Pat yourself dry with a CLEAN TOWEL.   10. Wear CLEAN PAJAMAS   11. Place CLEAN SHEETS on your bed the night of your first shower and DO NOT SLEEP WITH PETS.  Day of Surgery: Shower as above Do not apply any deodorants/lotions, powders and colognes. Please wear clean clothes to the hospital/surgery center.    Do not wear jewelry, make-up or nail polish..  Do not shave 48 hours prior to surgery.  Men may shave face and neck.  Do not bring valuables to the hospital.  [facility] is not responsible for any belongings or valuables.  Contacts, dentures or bridgework may not be worn into surgery.  Leave your suitcase in the car.  After surgery it may be brought to your room.  For patients admitted to the hospital, discharge time will be determined by your treatment team.  Patients discharged the day of surgery will not be allowed to drive home.   Please read over the following fact sheets that you were given: Pain Booklet, Patient Instructions for Mupirocin Application, Incentive Spirometry, Surgical Site Infections.

## 2016-11-15 ENCOUNTER — Inpatient Hospital Stay (HOSPITAL_COMMUNITY): Payer: Medicare HMO | Admitting: Vascular Surgery

## 2016-11-15 ENCOUNTER — Inpatient Hospital Stay (HOSPITAL_COMMUNITY): Payer: Medicare HMO | Admitting: Certified Registered"

## 2016-11-15 ENCOUNTER — Inpatient Hospital Stay (HOSPITAL_COMMUNITY): Payer: Medicare HMO

## 2016-11-15 ENCOUNTER — Encounter: Payer: Self-pay | Admitting: Thoracic Surgery (Cardiothoracic Vascular Surgery)

## 2016-11-15 ENCOUNTER — Inpatient Hospital Stay (HOSPITAL_COMMUNITY)
Admission: RE | Admit: 2016-11-15 | Discharge: 2016-11-17 | DRG: 267 | Disposition: A | Discharge status: Home / Self Care | Payer: Medicare HMO | Source: Ambulatory Visit | Attending: Cardiovascular Disease | Admitting: Cardiovascular Disease

## 2016-11-15 ENCOUNTER — Encounter (HOSPITAL_COMMUNITY): Payer: Self-pay | Admitting: Certified Registered"

## 2016-11-15 ENCOUNTER — Encounter (HOSPITAL_COMMUNITY)
Admission: RE | Disposition: A | Discharge status: Home / Self Care | Payer: Self-pay | Source: Ambulatory Visit | Attending: Cardiovascular Disease

## 2016-11-15 DIAGNOSIS — F329 Major depressive disorder, single episode, unspecified: Secondary | ICD-10-CM | POA: Diagnosis present

## 2016-11-15 DIAGNOSIS — I447 Left bundle-branch block, unspecified: Secondary | ICD-10-CM | POA: Diagnosis present

## 2016-11-15 DIAGNOSIS — N814 Uterovaginal prolapse, unspecified: Secondary | ICD-10-CM | POA: Diagnosis present

## 2016-11-15 DIAGNOSIS — I110 Hypertensive heart disease with heart failure: Secondary | ICD-10-CM | POA: Diagnosis present

## 2016-11-15 DIAGNOSIS — E559 Vitamin D deficiency, unspecified: Secondary | ICD-10-CM | POA: Diagnosis present

## 2016-11-15 DIAGNOSIS — Z006 Encounter for examination for normal comparison and control in clinical research program: Secondary | ICD-10-CM

## 2016-11-15 DIAGNOSIS — Z888 Allergy status to other drugs, medicaments and biological substances status: Secondary | ICD-10-CM

## 2016-11-15 DIAGNOSIS — I5032 Chronic diastolic (congestive) heart failure: Secondary | ICD-10-CM | POA: Diagnosis present

## 2016-11-15 DIAGNOSIS — R4189 Other symptoms and signs involving cognitive functions and awareness: Secondary | ICD-10-CM | POA: Diagnosis present

## 2016-11-15 DIAGNOSIS — I071 Rheumatic tricuspid insufficiency: Secondary | ICD-10-CM | POA: Diagnosis present

## 2016-11-15 DIAGNOSIS — Z6825 Body mass index (BMI) 25.0-25.9, adult: Secondary | ICD-10-CM | POA: Diagnosis not present

## 2016-11-15 DIAGNOSIS — I6381 Other cerebral infarction due to occlusion or stenosis of small artery: Secondary | ICD-10-CM | POA: Diagnosis present

## 2016-11-15 DIAGNOSIS — I700 Atherosclerosis of aorta: Secondary | ICD-10-CM | POA: Diagnosis present

## 2016-11-15 DIAGNOSIS — I350 Nonrheumatic aortic (valve) stenosis: Secondary | ICD-10-CM

## 2016-11-15 DIAGNOSIS — D649 Anemia, unspecified: Secondary | ICD-10-CM | POA: Diagnosis present

## 2016-11-15 DIAGNOSIS — Z7901 Long term (current) use of anticoagulants: Secondary | ICD-10-CM

## 2016-11-15 DIAGNOSIS — I471 Supraventricular tachycardia: Secondary | ICD-10-CM | POA: Diagnosis present

## 2016-11-15 DIAGNOSIS — Z952 Presence of prosthetic heart valve: Secondary | ICD-10-CM | POA: Diagnosis not present

## 2016-11-15 DIAGNOSIS — I2510 Atherosclerotic heart disease of native coronary artery without angina pectoris: Secondary | ICD-10-CM | POA: Diagnosis present

## 2016-11-15 DIAGNOSIS — E119 Type 2 diabetes mellitus without complications: Secondary | ICD-10-CM

## 2016-11-15 DIAGNOSIS — E785 Hyperlipidemia, unspecified: Secondary | ICD-10-CM | POA: Diagnosis present

## 2016-11-15 DIAGNOSIS — I272 Pulmonary hypertension, unspecified: Secondary | ICD-10-CM | POA: Diagnosis present

## 2016-11-15 DIAGNOSIS — K449 Diaphragmatic hernia without obstruction or gangrene: Secondary | ICD-10-CM | POA: Diagnosis present

## 2016-11-15 DIAGNOSIS — Z8673 Personal history of transient ischemic attack (TIA), and cerebral infarction without residual deficits: Secondary | ICD-10-CM

## 2016-11-15 DIAGNOSIS — Z833 Family history of diabetes mellitus: Secondary | ICD-10-CM | POA: Diagnosis not present

## 2016-11-15 DIAGNOSIS — I371 Nonrheumatic pulmonary valve insufficiency: Secondary | ICD-10-CM | POA: Diagnosis not present

## 2016-11-15 DIAGNOSIS — G3184 Mild cognitive impairment, so stated: Secondary | ICD-10-CM | POA: Diagnosis present

## 2016-11-15 DIAGNOSIS — I361 Nonrheumatic tricuspid (valve) insufficiency: Secondary | ICD-10-CM | POA: Diagnosis not present

## 2016-11-15 DIAGNOSIS — K219 Gastro-esophageal reflux disease without esophagitis: Secondary | ICD-10-CM | POA: Diagnosis present

## 2016-11-15 DIAGNOSIS — I10 Essential (primary) hypertension: Secondary | ICD-10-CM | POA: Diagnosis present

## 2016-11-15 DIAGNOSIS — E669 Obesity, unspecified: Secondary | ICD-10-CM | POA: Diagnosis present

## 2016-11-15 DIAGNOSIS — M109 Gout, unspecified: Secondary | ICD-10-CM | POA: Diagnosis present

## 2016-11-15 DIAGNOSIS — I352 Nonrheumatic aortic (valve) stenosis with insufficiency: Principal | ICD-10-CM | POA: Diagnosis present

## 2016-11-15 DIAGNOSIS — H409 Unspecified glaucoma: Secondary | ICD-10-CM | POA: Diagnosis present

## 2016-11-15 DIAGNOSIS — N390 Urinary tract infection, site not specified: Secondary | ICD-10-CM | POA: Diagnosis present

## 2016-11-15 DIAGNOSIS — I340 Nonrheumatic mitral (valve) insufficiency: Secondary | ICD-10-CM | POA: Diagnosis not present

## 2016-11-15 HISTORY — PX: TEE WITHOUT CARDIOVERSION: SHX5443

## 2016-11-15 HISTORY — PX: TRANSCATHETER AORTIC VALVE REPLACEMENT, TRANSFEMORAL: SHX6400

## 2016-11-15 LAB — POCT I-STAT, CHEM 8
BUN: 26 mg/dL — AB (ref 6–20)
BUN: 26 mg/dL — AB (ref 6–20)
BUN: 26 mg/dL — AB (ref 6–20)
BUN: 29 mg/dL — ABNORMAL HIGH (ref 6–20)
CALCIUM ION: 1.28 mmol/L (ref 1.15–1.40)
CALCIUM ION: 1.28 mmol/L (ref 1.15–1.40)
CALCIUM ION: 1.12 mmol/L — AB (ref 1.15–1.40)
CHLORIDE: 101 mmol/L (ref 101–111)
CHLORIDE: 100 mmol/L — AB (ref 101–111)
CHLORIDE: 101 mmol/L (ref 101–111)
CHLORIDE: 109 mmol/L (ref 101–111)
CREATININE: 0.6 mg/dL (ref 0.44–1.00)
CREATININE: 0.5 mg/dL (ref 0.44–1.00)
CREATININE: 1.9 mg/dL — AB (ref 0.44–1.00)
Calcium, Ion: 1.31 mmol/L (ref 1.15–1.40)
Creatinine, Ser: 0.6 mg/dL (ref 0.44–1.00)
GLUCOSE: 127 mg/dL — AB (ref 65–99)
GLUCOSE: 121 mg/dL — AB (ref 65–99)
GLUCOSE: 127 mg/dL — AB (ref 65–99)
GLUCOSE: 83 mg/dL (ref 65–99)
HCT: 28 % — ABNORMAL LOW (ref 36.0–46.0)
HCT: 29 % — ABNORMAL LOW (ref 36.0–46.0)
HCT: 35 % — ABNORMAL LOW (ref 36.0–46.0)
HEMATOCRIT: 30 % — AB (ref 36.0–46.0)
HEMOGLOBIN: 10.2 g/dL — AB (ref 12.0–15.0)
Hemoglobin: 9.5 g/dL — ABNORMAL LOW (ref 12.0–15.0)
Hemoglobin: 9.9 g/dL — ABNORMAL LOW (ref 12.0–15.0)
Hemoglobin: 11.9 g/dL — ABNORMAL LOW (ref 12.0–15.0)
POTASSIUM: 3.2 mmol/L — AB (ref 3.5–5.1)
POTASSIUM: 3.1 mmol/L — AB (ref 3.5–5.1)
POTASSIUM: 3.9 mmol/L (ref 3.5–5.1)
Potassium: 3.1 mmol/L — ABNORMAL LOW (ref 3.5–5.1)
SODIUM: 142 mmol/L (ref 135–145)
Sodium: 141 mmol/L (ref 135–145)
Sodium: 141 mmol/L (ref 135–145)
Sodium: 142 mmol/L (ref 135–145)
TCO2: 28 mmol/L (ref 22–32)
TCO2: 30 mmol/L (ref 22–32)
TCO2: 31 mmol/L (ref 22–32)
TCO2: 25 mmol/L (ref 22–32)

## 2016-11-15 LAB — CBC
HEMATOCRIT: 31.2 % — AB (ref 36.0–46.0)
HEMOGLOBIN: 10.2 g/dL — AB (ref 12.0–15.0)
MCH: 30.4 pg (ref 26.0–34.0)
MCHC: 32.7 g/dL (ref 30.0–36.0)
MCV: 93.1 fL (ref 78.0–100.0)
Platelets: 138 10*3/uL — ABNORMAL LOW (ref 150–400)
RBC: 3.35 MIL/uL — ABNORMAL LOW (ref 3.87–5.11)
RDW: 14.8 % (ref 11.5–15.5)
WBC: 6.6 10*3/uL (ref 4.0–10.5)

## 2016-11-15 LAB — POCT I-STAT 3, ART BLOOD GAS (G3+)
Acid-Base Excess: 5 mmol/L — ABNORMAL HIGH (ref 0.0–2.0)
BICARBONATE: 31.3 mmol/L — AB (ref 20.0–28.0)
O2 SAT: 100 %
PCO2 ART: 50.8 mmHg — AB (ref 32.0–48.0)
PH ART: 7.396 (ref 7.350–7.450)
Patient temperature: 98.1
TCO2: 33 mmol/L — AB (ref 22–32)
pO2, Arterial: 184 mmHg — ABNORMAL HIGH (ref 83.0–108.0)

## 2016-11-15 LAB — PROTIME-INR
INR: 1.23
PROTHROMBIN TIME: 15.4 s — AB (ref 11.4–15.2)

## 2016-11-15 LAB — POCT I-STAT 4, (NA,K, GLUC, HGB,HCT)
GLUCOSE: 119 mg/dL — AB (ref 65–99)
HCT: 29 % — ABNORMAL LOW (ref 36.0–46.0)
Hemoglobin: 9.9 g/dL — ABNORMAL LOW (ref 12.0–15.0)
POTASSIUM: 3.1 mmol/L — AB (ref 3.5–5.1)
Sodium: 143 mmol/L (ref 135–145)

## 2016-11-15 LAB — GLUCOSE, CAPILLARY: Glucose-Capillary: 100 mg/dL — ABNORMAL HIGH (ref 65–99)

## 2016-11-15 LAB — PREPARE RBC (CROSSMATCH)

## 2016-11-15 LAB — APTT: APTT: 33 s (ref 24–36)

## 2016-11-15 SURGERY — IMPLANTATION, AORTIC VALVE, TRANSCATHETER, FEMORAL APPROACH
Anesthesia: Monitor Anesthesia Care | Site: Chest

## 2016-11-15 MED ORDER — PILOCARPINE HCL 4 % OP SOLN
1.0000 [drp] | Freq: Four times a day (QID) | OPHTHALMIC | Status: DC
  Administered 2016-11-15 – 2016-11-17 (×7): 1 [drp] via OPHTHALMIC
  Filled 2016-11-15 (×2): qty 15

## 2016-11-15 MED ORDER — LACTATED RINGERS IV SOLN
INTRAVENOUS | Status: DC
  Administered 2016-11-15: 09:00:00 via INTRAVENOUS

## 2016-11-15 MED ORDER — PRAVASTATIN SODIUM 40 MG PO TABS
40.0000 mg | ORAL_TABLET | Freq: Every day | ORAL | Status: DC
  Administered 2016-11-15 – 2016-11-16 (×2): 40 mg via ORAL
  Filled 2016-11-15 (×2): qty 1

## 2016-11-15 MED ORDER — PHENYLEPHRINE HCL 10 MG/ML IJ SOLN
0.0000 ug/min | INTRAMUSCULAR | Status: DC
  Filled 2016-11-15: qty 2

## 2016-11-15 MED ORDER — OXYCODONE HCL 5 MG PO TABS: 5.0000 mg | ORAL_TABLET | ORAL | Status: DC | PRN

## 2016-11-15 MED ORDER — SODIUM CHLORIDE 0.9 % IV SOLN
INTRAVENOUS | Status: DC | PRN
  Administered 2016-11-15: 12:00:00 1500 mL

## 2016-11-15 MED ORDER — SODIUM CHLORIDE 0.9% FLUSH
3.0000 mL | Freq: Two times a day (BID) | INTRAVENOUS | Status: DC
  Administered 2016-11-15 – 2016-11-16 (×2): 3 mL via INTRAVENOUS

## 2016-11-15 MED ORDER — SODIUM CHLORIDE 0.9 % IV SOLN
1.0000 mL/kg/h | INTRAVENOUS | Status: AC
  Administered 2016-11-15: 1 mL/kg/h via INTRAVENOUS

## 2016-11-15 MED ORDER — CLOPIDOGREL BISULFATE 75 MG PO TABS
75.0000 mg | ORAL_TABLET | Freq: Every day | ORAL | Status: DC
  Administered 2016-11-16 – 2016-11-17 (×2): 75 mg via ORAL
  Filled 2016-11-15 (×2): qty 1

## 2016-11-15 MED ORDER — MIDAZOLAM HCL 2 MG/2ML IJ SOLN
INTRAMUSCULAR | Status: AC
  Filled 2016-11-15: qty 2

## 2016-11-15 MED ORDER — LIDOCAINE HCL (PF) 1 % IJ SOLN
INTRAMUSCULAR | Status: DC | PRN
  Administered 2016-11-15: 6 mL

## 2016-11-15 MED ORDER — MORPHINE SULFATE (PF) 2 MG/ML IV SOLN: 2.0000 mg | INTRAVENOUS | Status: DC | PRN

## 2016-11-15 MED ORDER — MIDAZOLAM HCL 2 MG/2ML IJ SOLN: 2.0000 mg | INTRAMUSCULAR | Status: DC | PRN

## 2016-11-15 MED ORDER — LATANOPROST 0.005 % OP SOLN
1.0000 [drp] | Freq: Every day | OPHTHALMIC | Status: DC
  Administered 2016-11-15 – 2016-11-16 (×2): 1 [drp] via OPHTHALMIC
  Filled 2016-11-15 (×2): qty 2.5

## 2016-11-15 MED ORDER — DEXMEDETOMIDINE HCL IN NACL 400 MCG/100ML IV SOLN
0.1000 ug/kg/h | INTRAVENOUS | Status: AC
  Administered 2016-11-15: 0.7 ug/kg/h via INTRAVENOUS

## 2016-11-15 MED ORDER — PANTOPRAZOLE SODIUM 40 MG PO TBEC
40.0000 mg | DELAYED_RELEASE_TABLET | Freq: Every day | ORAL | Status: DC
  Administered 2016-11-16 – 2016-11-17 (×2): 40 mg via ORAL
  Filled 2016-11-15 (×2): qty 1

## 2016-11-15 MED ORDER — DORZOLAMIDE HCL-TIMOLOL MAL 2-0.5 % OP SOLN
1.0000 [drp] | Freq: Two times a day (BID) | OPHTHALMIC | Status: DC
  Administered 2016-11-15 – 2016-11-17 (×4): 1 [drp] via OPHTHALMIC
  Filled 2016-11-15 (×2): qty 10

## 2016-11-15 MED ORDER — POLYSACCHARIDE IRON COMPLEX 150 MG PO CAPS
150.0000 mg | ORAL_CAPSULE | Freq: Two times a day (BID) | ORAL | Status: DC
  Administered 2016-11-15 – 2016-11-17 (×4): 150 mg via ORAL
  Filled 2016-11-15 (×5): qty 1

## 2016-11-15 MED ORDER — VANCOMYCIN HCL IN DEXTROSE 1-5 GM/200ML-% IV SOLN
1000.0000 mg | Freq: Once | INTRAVENOUS | Status: AC
  Administered 2016-11-15: 1000 mg via INTRAVENOUS
  Filled 2016-11-15: qty 200

## 2016-11-15 MED ORDER — BRIMONIDINE TARTRATE 0.15 % OP SOLN
1.0000 [drp] | Freq: Two times a day (BID) | OPHTHALMIC | Status: DC
  Administered 2016-11-15 – 2016-11-17 (×4): 1 [drp] via OPHTHALMIC
  Filled 2016-11-15 (×2): qty 5

## 2016-11-15 MED ORDER — MAGNESIUM SULFATE 50 % IJ SOLN: 40.0000 meq | INTRAMUSCULAR | Status: DC

## 2016-11-15 MED ORDER — ACETAMINOPHEN 500 MG PO TABS: 500.0000 mg | ORAL_TABLET | Freq: Four times a day (QID) | ORAL | Status: DC | PRN

## 2016-11-15 MED ORDER — SODIUM CHLORIDE 0.9% FLUSH: 3.0000 mL | INTRAVENOUS | Status: DC | PRN

## 2016-11-15 MED ORDER — MORPHINE SULFATE (PF) 4 MG/ML IV SOLN: 2.0000 mg | INTRAVENOUS | Status: DC | PRN

## 2016-11-15 MED ORDER — POTASSIUM CHLORIDE CRYS ER 10 MEQ PO TBCR
10.0000 meq | EXTENDED_RELEASE_TABLET | Freq: Every day | ORAL | Status: DC
  Administered 2016-11-16: 10 meq via ORAL
  Filled 2016-11-15 (×5): qty 1

## 2016-11-15 MED ORDER — PROTAMINE SULFATE 10 MG/ML IV SOLN
INTRAVENOUS | Status: DC | PRN
  Administered 2016-11-15: 60 mg via INTRAVENOUS

## 2016-11-15 MED ORDER — POTASSIUM CHLORIDE 10 MEQ/50ML IV SOLN
10.0000 meq | INTRAVENOUS | Status: AC
  Administered 2016-11-15 (×5): 10 meq via INTRAVENOUS
  Filled 2016-11-15 (×4): qty 50

## 2016-11-15 MED ORDER — HYDROCODONE-ACETAMINOPHEN 5-325 MG PO TABS: 1.0000 | ORAL_TABLET | Freq: Four times a day (QID) | ORAL | Status: DC | PRN

## 2016-11-15 MED ORDER — HYDRALAZINE HCL 50 MG PO TABS
50.0000 mg | ORAL_TABLET | Freq: Three times a day (TID) | ORAL | Status: DC
  Administered 2016-11-15 – 2016-11-16 (×3): 50 mg via ORAL
  Filled 2016-11-15 (×3): qty 1

## 2016-11-15 MED ORDER — ASPIRIN 81 MG PO CHEW
81.0000 mg | CHEWABLE_TABLET | Freq: Every day | ORAL | Status: DC
Start: 2016-11-16 — End: 2016-11-17
  Administered 2016-11-16 – 2016-11-17 (×2): 81 mg via ORAL
  Filled 2016-11-15 (×2): qty 1

## 2016-11-15 MED ORDER — SODIUM CHLORIDE 0.9 % IV SOLN: INTRAVENOUS | Status: DC

## 2016-11-15 MED ORDER — EPINEPHRINE PF 1 MG/ML IJ SOLN: 0.0000 ug/min | INTRAVENOUS | Status: DC

## 2016-11-15 MED ORDER — TRAMADOL HCL 50 MG PO TABS: 50.0000 mg | ORAL_TABLET | ORAL | Status: DC | PRN

## 2016-11-15 MED ORDER — LISINOPRIL 10 MG PO TABS: 10.0000 mg | ORAL_TABLET | Freq: Every day | ORAL | Status: DC

## 2016-11-15 MED ORDER — ISOSORBIDE MONONITRATE ER 30 MG PO TB24
30.0000 mg | ORAL_TABLET | Freq: Every day | ORAL | Status: DC
  Administered 2016-11-15 – 2016-11-17 (×3): 30 mg via ORAL
  Filled 2016-11-15 (×3): qty 1

## 2016-11-15 MED ORDER — METOPROLOL SUCCINATE ER 25 MG PO TB24
25.0000 mg | ORAL_TABLET | Freq: Every day | ORAL | Status: DC
  Administered 2016-11-16 – 2016-11-17 (×2): 25 mg via ORAL
  Filled 2016-11-15 (×3): qty 1

## 2016-11-15 MED ORDER — NITROGLYCERIN IN D5W 200-5 MCG/ML-% IV SOLN
0.0000 ug/min | INTRAVENOUS | Status: DC
  Administered 2016-11-15: 5 ug/min via INTRAVENOUS
  Administered 2016-11-15: 0 ug/min via INTRAVENOUS

## 2016-11-15 MED ORDER — POTASSIUM CHLORIDE 2 MEQ/ML IV SOLN: 80.0000 meq | INTRAVENOUS | Status: DC

## 2016-11-15 MED ORDER — CHLORHEXIDINE GLUCONATE 0.12 % MT SOLN
15.0000 mL | OROMUCOSAL | Status: AC
  Administered 2016-11-16: 15 mL via OROMUCOSAL
  Filled 2016-11-15: qty 15

## 2016-11-15 MED ORDER — VITAMIN D 1000 UNITS PO TABS
1000.0000 [IU] | ORAL_TABLET | Freq: Every day | ORAL | Status: DC
  Administered 2016-11-16 – 2016-11-17 (×3): 1000 [IU] via ORAL
  Filled 2016-11-15 (×4): qty 1

## 2016-11-15 MED ORDER — ALLOPURINOL 100 MG PO TABS
100.0000 mg | ORAL_TABLET | Freq: Every day | ORAL | Status: DC
  Administered 2016-11-16 – 2016-11-17 (×2): 100 mg via ORAL
  Filled 2016-11-15 (×2): qty 1

## 2016-11-15 MED ORDER — SODIUM CHLORIDE 0.9 % IV SOLN: 250.0000 mL | INTRAVENOUS | Status: DC | PRN

## 2016-11-15 MED ORDER — TIZANIDINE HCL 4 MG PO TABS
4.0000 mg | ORAL_TABLET | Freq: Three times a day (TID) | ORAL | Status: DC | PRN
  Filled 2016-11-15: qty 1

## 2016-11-15 MED ORDER — FENTANYL CITRATE (PF) 100 MCG/2ML IJ SOLN
INTRAMUSCULAR | Status: AC
  Filled 2016-11-15: qty 2

## 2016-11-15 MED ORDER — GABAPENTIN 300 MG PO CAPS
300.0000 mg | ORAL_CAPSULE | Freq: Two times a day (BID) | ORAL | Status: DC
  Administered 2016-11-15 – 2016-11-17 (×4): 300 mg via ORAL
  Filled 2016-11-15 (×4): qty 1

## 2016-11-15 MED ORDER — ONDANSETRON HCL 4 MG/2ML IJ SOLN: 4.0000 mg | Freq: Four times a day (QID) | INTRAMUSCULAR | Status: DC | PRN

## 2016-11-15 MED ORDER — ZINC OXIDE 12.8 % EX OINT
1.0000 "application " | TOPICAL_OINTMENT | Freq: Every day | CUTANEOUS | Status: DC | PRN
  Filled 2016-11-15: qty 56.7

## 2016-11-15 MED ORDER — 0.9 % SODIUM CHLORIDE (POUR BTL) OPTIME
TOPICAL | Status: DC | PRN
  Administered 2016-11-15: 5000 mL

## 2016-11-15 MED ORDER — FAMOTIDINE IN NACL 20-0.9 MG/50ML-% IV SOLN
20.0000 mg | Freq: Two times a day (BID) | INTRAVENOUS | Status: DC
  Administered 2016-11-15: 20 mg via INTRAVENOUS
  Filled 2016-11-15: qty 50

## 2016-11-15 MED ORDER — HEPARIN SODIUM (PORCINE) 1000 UNIT/ML IJ SOLN
INTRAMUSCULAR | Status: DC | PRN
  Administered 2016-11-15: 6000 [IU] via INTRAVENOUS

## 2016-11-15 MED ORDER — NITROGLYCERIN IN D5W 200-5 MCG/ML-% IV SOLN
2.0000 ug/min | INTRAVENOUS | Status: AC
  Administered 2016-11-15: 16.6 ug/min via INTRAVENOUS

## 2016-11-15 MED ORDER — LORAZEPAM 0.5 MG PO TABS
0.5000 mg | ORAL_TABLET | Freq: Every day | ORAL | Status: DC
  Administered 2016-11-15 – 2016-11-16 (×2): 0.5 mg via ORAL
  Filled 2016-11-15 (×2): qty 1

## 2016-11-15 MED ORDER — NOREPINEPHRINE BITARTRATE 1 MG/ML IV SOLN: 0.0000 ug/min | INTRAVENOUS | Status: DC

## 2016-11-15 MED ORDER — SERTRALINE HCL 50 MG PO TABS
50.0000 mg | ORAL_TABLET | Freq: Every day | ORAL | Status: DC
  Administered 2016-11-16 – 2016-11-17 (×2): 50 mg via ORAL
  Filled 2016-11-15 (×2): qty 1

## 2016-11-15 MED ORDER — CEFUROXIME SODIUM 1.5 G IV SOLR
1.5000 g | Freq: Two times a day (BID) | INTRAVENOUS | Status: DC
  Filled 2016-11-15: qty 1.5

## 2016-11-15 MED ORDER — ALBUMIN HUMAN 5 % IV SOLN: 250.0000 mL | INTRAVENOUS | Status: AC | PRN

## 2016-11-15 MED ORDER — VANCOMYCIN HCL 10 G IV SOLR
1250.0000 mg | INTRAVENOUS | Status: AC
  Administered 2016-11-15: 1250 mg via INTRAVENOUS
  Filled 2016-11-15: qty 1250

## 2016-11-15 MED ORDER — PRO-STAT SUGAR FREE PO LIQD
30.0000 mL | Freq: Three times a day (TID) | ORAL | Status: DC
  Administered 2016-11-15 – 2016-11-17 (×3): 30 mL via ORAL
  Filled 2016-11-15: qty 30

## 2016-11-15 MED ORDER — LACTATED RINGERS IV SOLN: 500.0000 mL | Freq: Once | INTRAVENOUS | Status: DC | PRN

## 2016-11-15 MED ORDER — TRAZODONE HCL 100 MG PO TABS
100.0000 mg | ORAL_TABLET | Freq: Every day | ORAL | Status: DC
  Administered 2016-11-15 – 2016-11-16 (×2): 100 mg via ORAL
  Filled 2016-11-15 (×3): qty 1

## 2016-11-15 MED ORDER — DEXTROSE 5 % IV SOLN
1.5000 g | INTRAVENOUS | Status: AC
  Administered 2016-11-15: 1.5 g via INTRAVENOUS

## 2016-11-15 MED ORDER — SODIUM CHLORIDE 0.9 % IV SOLN: 30.0000 ug/min | INTRAVENOUS | Status: DC

## 2016-11-15 MED ORDER — IODIXANOL 320 MG/ML IV SOLN
INTRAVENOUS | Status: DC | PRN
  Administered 2016-11-15: 60.8 mg via INTRA_ARTERIAL

## 2016-11-15 MED ORDER — SENNOSIDES-DOCUSATE SODIUM 8.6-50 MG PO TABS: 2.0000 | ORAL_TABLET | Freq: Every day | ORAL | Status: DC | PRN

## 2016-11-15 MED ORDER — ENSURE ENLIVE PO LIQD: 237.0000 mL | Freq: Two times a day (BID) | ORAL | Status: DC

## 2016-11-15 MED ORDER — DEXTROSE 5 % IV SOLN
1.5000 g | INTRAVENOUS | Status: DC
  Administered 2016-11-15: 1.5 g via INTRAVENOUS
  Filled 2016-11-15: qty 1.5

## 2016-11-15 MED ORDER — FUROSEMIDE 20 MG PO TABS: 20.0000 mg | ORAL_TABLET | Freq: Every day | ORAL | Status: DC | PRN

## 2016-11-15 MED ORDER — METOPROLOL TARTRATE 5 MG/5ML IV SOLN: 2.5000 mg | INTRAVENOUS | Status: DC | PRN

## 2016-11-15 MED ORDER — ISOSORBIDE MONONITRATE ER 30 MG PO TB24: 30.0000 mg | ORAL_TABLET | Freq: Every day | ORAL | Status: DC

## 2016-11-15 MED ORDER — DOPAMINE-DEXTROSE 3.2-5 MG/ML-% IV SOLN: 0.0000 ug/kg/min | INTRAVENOUS | Status: DC

## 2016-11-15 MED ORDER — PROPOFOL 500 MG/50ML IV EMUL
INTRAVENOUS | Status: DC | PRN
  Administered 2016-11-15: 10 ug/kg/min via INTRAVENOUS

## 2016-11-15 MED FILL — Magnesium Sulfate Inj 50%: INTRAMUSCULAR | Qty: 10 | Status: AC

## 2016-11-15 MED FILL — Heparin Sodium (Porcine) Inj 1000 Unit/ML: INTRAMUSCULAR | Qty: 30 | Status: AC

## 2016-11-15 MED FILL — Potassium Chloride Inj 2 mEq/ML: INTRAVENOUS | Qty: 80 | Status: CN

## 2016-11-15 SURGICAL SUPPLY — 99 items
ADAPTER UNIV SWAN GANZ BIP (ADAPTER) ×2 IMPLANT
ADAPTER UNV SWAN GANZ BIP (ADAPTER) ×2
ATTRACTOMAT 16X20 MAGNETIC DRP (DRAPES) IMPLANT
BAG BANDED W/RUBBER/TAPE 36X54 (MISCELLANEOUS) ×4 IMPLANT
BAG DECANTER FOR FLEXI CONT (MISCELLANEOUS) IMPLANT
BAG SNAP BAND KOVER 36X36 (MISCELLANEOUS) ×8 IMPLANT
BLADE 10 SAFETY STRL DISP (BLADE) ×4 IMPLANT
BLADE CLIPPER SURG (BLADE) IMPLANT
BLADE STERNUM SYSTEM 6 (BLADE) ×4 IMPLANT
CABLE ADAPT CONN TEMP 6FT (ADAPTER) ×4 IMPLANT
CABLE PACING FASLOC BIEGE (MISCELLANEOUS) ×4 IMPLANT
CABLE PACING FASLOC BLUE (MISCELLANEOUS) ×4 IMPLANT
CANISTER SUCT 3000ML PPV (MISCELLANEOUS) IMPLANT
CANNULA FEM VENOUS REMOTE 22FR (CANNULA) IMPLANT
CANNULA OPTISITE PERFUSION 16F (CANNULA) IMPLANT
CANNULA OPTISITE PERFUSION 18F (CANNULA) IMPLANT
CATH DIAG EXPO 6F VENT PIG 145 (CATHETERS) ×8 IMPLANT
CATH EXPO 5FR AL1 (CATHETERS) ×4 IMPLANT
CATH S G BIP PACING (SET/KITS/TRAYS/PACK) ×8 IMPLANT
CLIP VESOCCLUDE MED 24/CT (CLIP) ×4 IMPLANT
CLIP VESOCCLUDE SM WIDE 24/CT (CLIP) ×4 IMPLANT
CONT SPEC 4OZ CLIKSEAL STRL BL (MISCELLANEOUS) ×8 IMPLANT
COVER BACK TABLE 60X90IN (DRAPES) ×4 IMPLANT
COVER BACK TABLE 80X110 HD (DRAPES) ×4 IMPLANT
COVER DOME SNAP 22 D (MISCELLANEOUS) ×4 IMPLANT
COVER MAYO STAND STRL (DRAPES) ×4 IMPLANT
CRADLE DONUT ADULT HEAD (MISCELLANEOUS) ×4 IMPLANT
DERMABOND ADVANCED (GAUZE/BANDAGES/DRESSINGS) ×2
DERMABOND ADVANCED .7 DNX12 (GAUZE/BANDAGES/DRESSINGS) ×2 IMPLANT
DEVICE CLOSURE PERCLS PRGLD 6F (VASCULAR PRODUCTS) ×4 IMPLANT
DRAPE INCISE IOBAN 66X45 STRL (DRAPES) IMPLANT
DRAPE SLUSH MACHINE 52X66 (DRAPES) ×4 IMPLANT
DRSG TEGADERM 4X4.75 (GAUZE/BANDAGES/DRESSINGS) ×4 IMPLANT
ELECT REM PT RETURN 9FT ADLT (ELECTROSURGICAL) ×8
ELECTRODE REM PT RTRN 9FT ADLT (ELECTROSURGICAL) ×4 IMPLANT
FELT TEFLON 6X6 (MISCELLANEOUS) ×4 IMPLANT
FEMORAL VENOUS CANN RAP (CANNULA) IMPLANT
GAUZE SPONGE 4X4 12PLY STRL (GAUZE/BANDAGES/DRESSINGS) ×4 IMPLANT
GAUZE SPONGE 4X4 12PLY STRL LF (GAUZE/BANDAGES/DRESSINGS) ×4 IMPLANT
GLOVE BIO SURGEON STRL SZ7.5 (GLOVE) ×4 IMPLANT
GLOVE BIO SURGEON STRL SZ8 (GLOVE) ×8 IMPLANT
GLOVE EUDERMIC 7 POWDERFREE (GLOVE) ×4 IMPLANT
GLOVE ORTHO TXT STRL SZ7.5 (GLOVE) ×4 IMPLANT
GOWN STRL REUS W/ TWL LRG LVL3 (GOWN DISPOSABLE) ×6 IMPLANT
GOWN STRL REUS W/ TWL XL LVL3 (GOWN DISPOSABLE) ×12 IMPLANT
GOWN STRL REUS W/TWL LRG LVL3 (GOWN DISPOSABLE) ×6
GOWN STRL REUS W/TWL XL LVL3 (GOWN DISPOSABLE) ×12
GUIDEWIRE SAF TJ AMPL .035X180 (WIRE) ×4 IMPLANT
GUIDEWIRE SAFE TJ AMPLATZ EXST (WIRE) ×4 IMPLANT
GUIDEWIRE STRAIGHT .035 260CM (WIRE) ×4 IMPLANT
INSERT FOGARTY 61MM (MISCELLANEOUS) ×4 IMPLANT
INSERT FOGARTY SM (MISCELLANEOUS) IMPLANT
INSERT FOGARTY XLG (MISCELLANEOUS) IMPLANT
KIT BASIN OR (CUSTOM PROCEDURE TRAY) ×4 IMPLANT
KIT DILATOR VASC 18G NDL (KITS) IMPLANT
KIT HEART LEFT (KITS) ×4 IMPLANT
KIT ROOM TURNOVER OR (KITS) ×4 IMPLANT
KIT SUCTION CATH 14FR (SUCTIONS) ×8 IMPLANT
NEEDLE PERC 18GX7CM (NEEDLE) ×4 IMPLANT
NS IRRIG 1000ML POUR BTL (IV SOLUTION) ×12 IMPLANT
PACK AORTA (CUSTOM PROCEDURE TRAY) ×4 IMPLANT
PAD ARMBOARD 7.5X6 YLW CONV (MISCELLANEOUS) ×8 IMPLANT
PAD ELECT DEFIB RADIOL ZOLL (MISCELLANEOUS) ×4 IMPLANT
PATCH TACHOSII LRG 9.5X4.8 (VASCULAR PRODUCTS) IMPLANT
PERCLOSE PROGLIDE 6F (VASCULAR PRODUCTS) ×8
SET MICROPUNCTURE 5F STIFF (MISCELLANEOUS) ×4 IMPLANT
SHEATH AVANTI 11CM 8FR (MISCELLANEOUS) ×4 IMPLANT
SHEATH PINNACLE 6F 10CM (SHEATH) ×8 IMPLANT
SLEEVE REPOSITIONING LENGTH 30 (MISCELLANEOUS) ×4 IMPLANT
SPONGE LAP 4X18 X RAY DECT (DISPOSABLE) ×4 IMPLANT
STOPCOCK MORSE 400PSI 3WAY (MISCELLANEOUS) ×24 IMPLANT
SUT ETHIBOND X763 2 0 SH 1 (SUTURE) IMPLANT
SUT GORETEX CV 4 TH 22 36 (SUTURE) IMPLANT
SUT GORETEX CV4 TH-18 (SUTURE) IMPLANT
SUT GORETEX TH-18 36 INCH (SUTURE) IMPLANT
SUT MNCRL AB 3-0 PS2 18 (SUTURE) IMPLANT
SUT PROLENE 3 0 SH1 36 (SUTURE) IMPLANT
SUT PROLENE 4 0 RB 1 (SUTURE)
SUT PROLENE 4-0 RB1 .5 CRCL 36 (SUTURE) IMPLANT
SUT PROLENE 5 0 C 1 36 (SUTURE) IMPLANT
SUT PROLENE 6 0 C 1 30 (SUTURE) IMPLANT
SUT SILK  1 MH (SUTURE) ×2
SUT SILK 1 MH (SUTURE) ×2 IMPLANT
SUT SILK 2 0 SH CR/8 (SUTURE) IMPLANT
SUT VIC AB 2-0 CT1 27 (SUTURE)
SUT VIC AB 2-0 CT1 TAPERPNT 27 (SUTURE) IMPLANT
SUT VIC AB 2-0 CTX 36 (SUTURE) IMPLANT
SUT VIC AB 3-0 SH 8-18 (SUTURE) IMPLANT
SYR 10ML LL (SYRINGE) ×12 IMPLANT
SYR 30ML LL (SYRINGE) ×8 IMPLANT
SYR 50ML LL SCALE MARK (SYRINGE) ×4 IMPLANT
TOWEL OR 17X26 10 PK STRL BLUE (TOWEL DISPOSABLE) ×8 IMPLANT
TRANSDUCER W/STOPCOCK (MISCELLANEOUS) ×8 IMPLANT
TRAY FOLEY SILVER 14FR TEMP (SET/KITS/TRAYS/PACK) ×4 IMPLANT
TUBE SUCT INTRACARD DLP 20F (MISCELLANEOUS) IMPLANT
TUBING HIGH PRESSURE 120CM (CONNECTOR) ×4 IMPLANT
VALVE HEART TRANSCATH SZ3 23MM (Prosthesis & Implant Heart) ×4 IMPLANT
WIRE AMPLATZ SS-J .035X180CM (WIRE) ×4 IMPLANT
WIRE BENTSON .035X145CM (WIRE) ×4 IMPLANT

## 2016-11-15 NOTE — Anesthesia Preprocedure Evaluation (Signed)
Anesthesia Evaluation  Patient identified by MRN, date of birth, ID band Patient awake    Reviewed: Allergy & Precautions, NPO status , Patient's Chart, lab work & pertinent test results, reviewed documented beta blocker date and time   History of Anesthesia Complications Negative for: history of anesthetic complications  Airway Mallampati: III  TM Distance: >3 FB Neck ROM: Full    Dental  (+) Edentulous Upper, Edentulous Lower   Pulmonary neg pulmonary ROS,    breath sounds clear to auscultation       Cardiovascular hypertension, Pt. on medications and Pt. on home beta blockers + Past MI, + Peripheral Vascular Disease and +CHF  + Valvular Problems/Murmurs AS  Rhythm:Regular + Systolic murmurs    Neuro/Psych  Headaches, PSYCHIATRIC DISORDERS Depression  Neuromuscular disease CVA    GI/Hepatic Neg liver ROS, hiatal hernia, GERD  Medicated and Controlled,  Endo/Other  diabetes  Renal/GU negative Renal ROS     Musculoskeletal   Abdominal   Peds  Hematology  (+) anemia ,   Anesthesia Other Findings   Reproductive/Obstetrics                             Anesthesia Physical Anesthesia Plan  ASA: IV  Anesthesia Plan: MAC   Post-op Pain Management:    Induction:   PONV Risk Score and Plan: 2 and Ondansetron  Airway Management Planned: Nasal Cannula  Additional Equipment: Arterial line, CVP and Ultrasound Guidance Line Placement  Intra-op Plan:   Post-operative Plan:   Informed Consent: I have reviewed the patients History and Physical, chart, labs and discussed the procedure including the risks, benefits and alternatives for the proposed anesthesia with the patient or authorized representative who has indicated his/her understanding and acceptance.   Dental advisory given  Plan Discussed with: CRNA and Surgeon  Anesthesia Plan Comments:         Anesthesia Quick  Evaluation

## 2016-11-15 NOTE — Interval H&P Note (Signed)
History and Physical Interval Note:  11/15/2016 10:33 AM  [name]  has presented today for surgery, with the diagnosis of severe aortic stenosis  The various methods of treatment have been discussed with the patient and family. After consideration of risks, benefits and other options for treatment, the patient has consented to  Procedure(s): TRANSCATHETER AORTIC VALVE REPLACEMENT, TRANSFEMORAL (N/A) TRANSESOPHAGEAL ECHOCARDIOGRAM (TEE) (N/A) as a surgical intervention .  The patient's history has been reviewed, patient examined, no change in status, stable for surgery.  I have reviewed the patient's chart and labs.  Questions were answered to the patient's satisfaction.     Gaye Pollack

## 2016-11-15 NOTE — Progress Notes (Signed)
[facility] BRIEF SICU PROGRESS NOTE  Day of Surgery  S/P Procedure(s) (LRB): TRANSCATHETER AORTIC VALVE REPLACEMENT, TRANSFEMORAL (N/A) TRANSESOPHAGEAL ECHOCARDIOGRAM (TEE) (N/A)   Looks good NSR w/ stable BP Both groins okay  Plan: Continue routine post TAVR  Rexene Alberts, MD 11/15/2016 6:19 PM

## 2016-11-15 NOTE — Op Note (Signed)
[facility]   MULTIDISCIPLINARY HEART VALVE TEAM   TAVR OPERATIVE NOTE   Date of Procedure:  11/15/2016  Preoperative Diagnosis: Severe Aortic Stenosis   Postoperative Diagnosis: Same   Procedure:    Transcatheter Aortic Valve Replacement - Percutaneous Right Transfemoral Approach  Edwards Sapien 3 THV (size 23 mm, model # 9600TFX, serial # [id number])   Co-Surgeons: Gaye Pollack, MD and [name], MD   Anesthesiologist:  Laurie Panda, MD  Echocardiographer:  Jenkins Rouge, MD  Pre-operative Echo Findings:  Severe aortic stenosis and moderate aortic insufficiency   Normal left ventricular systolic function  Post-operative Echo Findings:  Mild central AI  Mild left ventricular systolic dysfunction   BRIEF CLINICAL NOTE AND INDICATIONS FOR SURGERY  The patient is a 83 year old woman with hypertension, DM, hyperlipidemia and known aortic stenosis that has been followed by Dr. Rockey Situ. Her echo in 08/2014 showed severe AS with a mean gradient of 38 mm Hg and peak velocity ratio of 0.25. She was admitted in 02/2016 with enterococcal sepsis and a TEE showed what was felt to be moderate AS with an AVA of 1.34 cm2 with mild to moderate AI and normal LV function. There was no evidence of endocarditis. There was moderate pulmonary HTN. Her daughter is with her today and says that since then she has been deteriorating with progressive exertional SOB and fatigue. She was most recently hospitalized in July with acute on chronic diastolic heart failure. She was discharged to a rehab facility and just recently went back home. Her daughter says that she is walking in the house without a cane or walker but gets short of breath easily. She can't go out of the house to do the things she used to do since January.   She has stage D, severe, symptomatic aortic stenosis with NYHA class 3 symptoms of exertional shortness of breath and fatigue and recurrent admissions for acute on  chronic diastolic heart failure with worsening shortness of breath and peripheral edema. She is frail as noted in her PT evaluation and I don't think she is a candidate for open surgical AVR due to her advanced age and frailty. TAVR may be a reasonable alternative although with her degree of frailty at her age I don't know if she will make a good functional recovery. Her gated cardiac CT shows anatomy suitable for a Sapien 3 valve. Her abdominal and pelvic CT shows vasculature suitable for transfemoral insertion. The patient and her daughter were counseled at length regarding treatment alternatives for management of severe symptomatic aortic stenosis. The risks and benefits of surgical intervention has been discussed in detail. Long-term prognosis with medical therapy was discussed. Alternative approaches such as conventional surgical aortic valve replacement, transcatheter aortic valve replacement, and palliative medical therapy were compared and contrasted at length. This discussion was placed in the context of the patient's own specific clinical presentation and past medical history. All of their questions been addressed. The patient and her daughter are eager to proceed with surgical management as soon as possible. Her daughter says that she was quite active until January 2018 and she feels like her mother can get back to that level if her aortic stenosis is treated.   We discussed complications that might develop including but not limited to risks of death, stroke, paravalvular leak, aortic dissection or other major vascular complications, aortic annulus rupture, device embolization, cardiac rupture or perforation, mitral regurgitation, acute myocardial infarction, arrhythmia, heart block or bradycardia requiring permanent pacemaker  placement, congestive heart failure, respiratory failure, renal failure, pneumonia, infection, other late complications related to structural valve deterioration or migration, or  other complications that might ultimately cause a temporary or permanent loss of functional independence or other long term morbidity. The patient provides full informed consent for the procedure as described and all questions were answered.      DETAILS OF THE OPERATIVE PROCEDURE  PREPARATION:    The patient is brought to the operating room on the above mentioned date and central monitoring was established by the anesthesia team including placement of a central venous line and radial arterial line. The patient is placed in the supine position on the operating table.  Intravenous antibiotics are administered. The patient is monitored closely throughout the procedure under conscious sedation. Baseline transthoracic echocardiogram was performed. The patient's abdomen, both groins, and both lower extremities are prepared and draped in a sterile manner. A time out procedure is performed.   PERIPHERAL ACCESS:    Using the modified Seldinger technique, femoral arterial and venous access was obtained with placement of 6 Fr sheaths on the left side.  A pigtail diagnostic catheter was passed through the left arterial sheath under fluoroscopic guidance into the aortic root.  A temporary transvenous pacemaker catheter was passed through the left femoral venous sheath under fluoroscopic guidance into the right ventricle.  The pacemaker was tested to ensure stable lead placement and pacemaker capture. Aortic root angiography was performed in order to determine the optimal angiographic angle for valve deployment.   TRANSFEMORAL ACCESS:   Percutaneous transfemoral access and sheath placement was performed by Dr. Burt Knack using ultrasound guidance.  The right common femoral artery was cannulated using a micropuncture needle and appropriate location was verified using hand injection angiogram.  A pair of Abbott Perclose percutaneous closure devices were placed and a 6 French sheath replaced into the femoral  artery.  The patient was heparinized systemically and ACT verified > 250 seconds.    A 14 Fr transfemoral E-sheath was introduced into the right femoral artery after progressively dilating over an Amplatz superstiff wire. An pigtail catheter was used to direct a J-tip wire across the native aortic valve into the apex of the left ventricle.  Simultaneous LV and Ao pressures were recorded.  The pigtail catheter was exchanged for an Amplatz Extra-stiff wire in the LV apex.  Echocardiography was utilized to confirm appropriate wire position and no sign of entanglement in the mitral subvalvular apparatus.   BALLOON AORTIC VALVULOPLASTY:   Not performed   TRANSCATHETER HEART VALVE DEPLOYMENT:   An Edwards Sapien 3 transcatheter heart valve (size 23 mm, model #9600TFX, serial #[id number]) was prepared and crimped per manufacturer's guidelines, and the proper orientation of the valve is confirmed on the [employer] delivery system. The valve was advanced through the introducer sheath using normal technique until in an appropriate position in the abdominal aorta beyond the sheath tip. The balloon was then retracted and using the fine-tuning wheel was centered on the valve. The valve was then advanced across the aortic arch using appropriate flexion of the catheter. The valve was carefully positioned across the aortic valve annulus. The Commander catheter was retracted using normal technique. Once final position of the valve has been confirmed by angiographic assessment, the valve is deployed while temporarily holding ventilation and during rapid ventricular pacing to maintain systolic blood pressure < 50 mmHg and pulse pressure < 10 mmHg. The balloon inflation is held for >3 seconds after reaching full deployment volume. Once the  balloon has fully deflated the balloon is retracted into the ascending aorta and valve function is assessed using echocardiography. There is felt to be no paravalvular leak and mild  central aortic insufficiency.  The patient's hemodynamic recovery following valve deployment is good.  The deployment balloon and guidewire are both removed. Aortography confirmed mild central AI.   PROCEDURE COMPLETION:   The sheath was removed and femoral artery closure performed by Dr Burt Knack.  Protamine was administered once femoral arterial repair was complete. The temporary pacemaker, pigtail catheters and femoral sheaths were removed with manual pressure used for hemostasis.   The patient tolerated the procedure well and is transported to the surgical intensive care in stable condition. There were no immediate intraoperative complications. All sponge instrument and needle counts are verified correct at completion of the operation.   No blood products were administered during the operation.  The patient received a total of 60 mL of intravenous contrast during the procedure.   Gaye Pollack, MD 11/15/2016

## 2016-11-15 NOTE — Anesthesia Procedure Notes (Addendum)
Arterial Line Insertion Performed by: Lelon Perla A, CRNA  Preanesthetic checklist: patient identified, IV checked, surgical consent and monitors and equipment checked Lidocaine 1% used for infiltration Right, radial was placed Catheter size: 20 G Hand hygiene performed  and maximum sterile barriers used   Attempts: 1 Procedure performed without using ultrasound guided technique. Following insertion, dressing applied and Biopatch. Post procedure assessment: normal  Patient tolerated the procedure well with no immediate complications.

## 2016-11-15 NOTE — Progress Notes (Signed)
  Echocardiogram 2D Echocardiogram has been performed.  Bobbye Charleston 11/15/2016, 12:15 PM

## 2016-11-15 NOTE — Anesthesia Postprocedure Evaluation (Signed)
Anesthesia Post Note  Patient: Elisandra Shoffner-Simmons  Procedure(s) Performed: Procedure(s) (LRB): TRANSCATHETER AORTIC VALVE REPLACEMENT, TRANSFEMORAL (N/A) TRANSESOPHAGEAL ECHOCARDIOGRAM (TEE) (N/A)     Patient location during evaluation: PACU Anesthesia Type: MAC Level of consciousness: awake and alert Pain management: pain level controlled Vital Signs Assessment: post-procedure vital signs reviewed and stable Respiratory status: spontaneous breathing, nonlabored ventilation, respiratory function stable and patient connected to nasal cannula oxygen Cardiovascular status: stable Postop Assessment: no apparent nausea or vomiting Anesthetic complications: no    Last Vitals:  Vitals:   11/15/16 1330 11/15/16 1345  BP: 137/76 (!) 162/75  Pulse: (!) 57 60  Resp: 13 13  Temp:    SpO2: 100% 100%    Last Pain: There were no vitals filed for this visit.               Darika Ildefonso

## 2016-11-15 NOTE — Op Note (Signed)
[facility]   MULTIDISCIPLINARY HEART VALVE TEAM   TAVR OPERATIVE NOTE   Date of Procedure:  11/15/2016  Preoperative Diagnosis: Severe Aortic Stenosis   Postoperative Diagnosis: Same   Procedure:    Transcatheter Aortic Valve Replacement - Percutaneous Transfemoral Approach  Edwards Sapien 3 THV (size 23 mm, model # 9600TFX, serial # [id number])   Co-Surgeons: Gaye Pollack, MD and [name], MD  Anesthesiologist:  Laurie Panda, MD  Echocardiographer:  Jenkins Rouge, MD  Pre-operative Echo Findings:  severe aortic stenosis and moderate-severe aortic insufficiency  Normal left ventricular systolic function  Post-operative Echo Findings:  Mild central leak  Mildly impaired left ventricular systolic function  BRIEF CLINICAL NOTE AND INDICATIONS FOR SURGERY  83 year-old woman with calcific degenerative AS/AI (Stage D1 disease) who is referred for TAVR. She has experienced progressive dyspnea with exertion, NYHA Functional Class 3 symptoms of chronic diastolic heart failure. She has been hospitalized with heart failure exacerbation within the past few months as well.  During the course of the patient's preoperative work up they have been evaluated comprehensively by a multidisciplinary team of specialists coordinated through the [facility] in the [facility].  They have been demonstrated to suffer from symptomatic severe aortic stenosis as noted above. The patient has been counseled extensively as to the relative risks and benefits of all options for the treatment of severe aortic stenosis including long term medical therapy, conventional surgery for aortic valve replacement, and transcatheter aortic valve replacement.  The patient has been independently evaluated by two cardiac surgeons including Dr. Roxy Manns and Dr. Cyndia Bent, and they are felt to be at prohibitive risk for conventional surgical aortic valve  replacement based upon comorbidities including advanced age, frailty, mild dementia, and poor functional capacity.   Based upon review of all of the patient's preoperative diagnostic tests they are felt to be candidate for transcatheter aortic valve replacement using the transfemoral approach as an alternative to high risk conventional surgery.    Following the decision to proceed with transcatheter aortic valve replacement, a discussion has been held regarding what types of management strategies would be attempted intraoperatively in the event of life-threatening complications, including whether or not the patient would be considered a candidate for the use of cardiopulmonary bypass and/or conversion to open sternotomy for attempted surgical intervention.  The patient has been advised of a variety of complications that might develop peculiar to this approach including but not limited to risks of death, stroke, paravalvular leak, aortic dissection or other major vascular complications, aortic annulus rupture, device embolization, cardiac rupture or perforation, acute myocardial infarction, arrhythmia, heart block or bradycardia requiring permanent pacemaker placement, congestive heart failure, respiratory failure, renal failure, pneumonia, infection, other late complications related to structural valve deterioration or migration, or other complications that might ultimately cause a temporary or permanent loss of functional independence or other long term morbidity.  The patient provides full informed consent for the procedure as described and all questions were answered preoperatively.  DETAILS OF THE OPERATIVE PROCEDURE  PREPARATION:   The patient is brought to the operating room on the above mentioned date and central monitoring was established by the anesthesia team including placement of a central venous catheter and radial arterial line. The patient is placed in the supine position on the operating  table.  Intravenous antibiotics are administered. The patient is monitored closely throughout the procedure under conscious sedation.  A Foley catheter is placed.  Baseline transthoracic  echocardiogram is performed. The patient's chest, abdomen, both groins, and both lower extremities are prepared and draped in a sterile manner. A time out procedure is performed.   PERIPHERAL ACCESS:   Using ultrasound guidance, femoral arterial and venous access is obtained with placement of 6 Fr sheaths on the left side.  A pigtail diagnostic catheter was passed through the femoral arterial sheath under fluoroscopic guidance into the aortic root.  A temporary transvenous pacemaker catheter was passed through the femoral venous sheath under fluoroscopic guidance into the right ventricle.  The pacemaker was tested to ensure stable lead placement and pacemaker capture. Aortic root angiography was performed in order to determine the optimal angiographic angle for valve deployment.  TRANSFEMORAL ACCESS:  A micropuncture technique is used to access the right femoral artery under fluoroscopic and ultrasound guidance.  2 Perclose devices are deployed at 10' and 2' positions to 'PreClose' the femoral artery. An 8 French sheath is placed and then an Amplatz Superstiff wire is advanced through the sheath. This is changed out for a 14 French transfemoral E-Sheath after progressively dilating over the Superstiff wire.  A pigtail catheter was used to direct a J-wire across the native aortic valve into the left ventricle. LV and Ao pressures were recorded.  The pigtail catheter was exchanged for an Amplatz Extra-stiff wire in the LV apex.    TRANSCATHETER HEART VALVE DEPLOYMENT:  An Edwards Sapien 3 transcatheter heart valve (size 23 mm, model #9600TFX, serial #[id number]) was prepared and crimped per manufacturer's guidelines, and the proper orientation of the valve is confirmed on the [employer] delivery system. The valve was  advanced through the introducer sheath using normal technique until in an appropriate position in the abdominal aorta beyond the sheath tip. The balloon was then retracted and using the fine-tuning wheel was centered on the valve. The valve was then advanced across the aortic arch using appropriate flexion of the catheter. The valve was carefully positioned across the aortic valve annulus. The Commander catheter was retracted using normal technique. Once final position of the valve has been confirmed by angiographic assessment, the valve is deployed while temporarily holding ventilation and during rapid ventricular pacing to maintain systolic blood pressure < 50 mmHg and pulse pressure < 10 mmHg. The balloon inflation is held for >3 seconds after reaching full deployment volume. Once the balloon has fully deflated the balloon is retracted into the ascending aorta and valve function is assessed using echocardiography. There is felt to be trace paravalvular leak and mild central aortic insufficiency.  The patient's hemodynamic recovery following valve deployment is good.  The deployment balloon and guidewire are both removed. Echo demostrated acceptable post-procedural gradients, stable mitral valve function, and mild aortic insufficiency. Aortography confirmed mild aortic insufficiency.   PROCEDURE COMPLETION:  The sheath was removed and femoral artery closure is performed using the 2 previously deployed Perclose devices.  Protamine is administered once femoral arterial repair was complete. The site is clear with no evidence of bleeding or hematoma after the sutures are tightened. The temporary pacemaker, pigtail catheters and femoral sheaths were removed with manual pressure used for hemostasis.   The patient tolerated the procedure well and is transported to the surgical intensive care in stable condition. There were no immediate intraoperative complications. All sponge instrument and needle counts are  verified correct at completion of the operation.   The patient received a total of 60 mL of intravenous contrast during the procedure.   [name], MD 11/15/2016 12:39 PM

## 2016-11-15 NOTE — Anesthesia Procedure Notes (Signed)
Central Venous Catheter Insertion Performed by: Oleta Mouse, anesthesiologist Start/End9/18/2018 9:59 AM, 11/15/2016 10:07 AM Patient location: Pre-op. Preanesthetic checklist: patient identified, IV checked, site marked, risks and benefits discussed, surgical consent, monitors and equipment checked, pre-op evaluation, timeout performed and anesthesia consent Lidocaine 1% used for infiltration Hand hygiene performed  and maximum sterile barriers used  Catheter size: 8 Fr Total catheter length 16. Central line was placed.Double lumen Procedure performed using ultrasound guided technique. Ultrasound Notes:anatomy identified, needle tip was noted to be adjacent to the nerve/plexus identified, no ultrasound evidence of intravascular and/or intraneural injection and image(s) printed for medical record Attempts: 1 Following insertion, dressing applied, line sutured and Biopatch. Post procedure assessment: blood return through all ports, free fluid flow and no air  Patient tolerated the procedure well with no immediate complications.

## 2016-11-15 NOTE — Progress Notes (Signed)
PHARMACY NOTE:  ANTIMICROBIAL RENAL DOSAGE ADJUSTMENT  Current antimicrobial regimen includes a mismatch between antimicrobial dosage and estimated renal function.  As per policy approved by the Pharmacy & Therapeutics and Medical Executive Committees, the antimicrobial dosage will be adjusted accordingly.  Current antimicrobial dosage:  Cefuroxime 1.5g IV every 12 hours x 4 doses  Indication: Post-op TAVR surgical prophylaxis  Renal Function:  Estimated Creatinine Clearance: 15.7 mL/min (A) (by C-G formula based on SCr of 1.9 mg/dL (H)). []      On intermittent HD, scheduled: []      On CRRT    Antimicrobial dosage has been changed to:  Cefuroxime 1.5g IV every 24 hours x 2 doses  Additional comments: New dosing will still provide ~48h post-op coverage  Thank you for allowing pharmacy to be a part of this patient's care.  Lawson Radar, [facility] 11/15/2016 9:12 PM

## 2016-11-15 NOTE — Progress Notes (Addendum)
  [place] VALVE TEAM  Patient doing well after TAVR. Tele with sinus rhythm HR 50-60s. Groin sites stable. K 3.1. Will supplement K now with IV potassium given difficulty swallowing. Patient hypertensive and on IV nitro. Will resume home antihypertensives (but hold Toprol XL for now) so we can wean nitro. Creat elevated to 1.9. Holding home lisinopril today as well. Will remove A line. Early ambulation with plans to transfer to the floor tomorrow.    Angelena Form PA-C  [facility]

## 2016-11-15 NOTE — Transfer of Care (Signed)
Immediate Anesthesia Transfer of Care Note  Patient: [name]  Procedure(s) Performed: Procedure(s): TRANSCATHETER AORTIC VALVE REPLACEMENT, TRANSFEMORAL (N/A) TRANSESOPHAGEAL ECHOCARDIOGRAM (TEE) (N/A)  Patient Location: ICU  Anesthesia Type:MAC  Level of Consciousness: drowsy and patient cooperative  Airway & Oxygen Therapy: Patient Spontanous Breathing  Post-op Assessment: Report given to RN  Post vital signs: Reviewed and stable  Last Vitals:  Vitals:   11/15/16 1042 11/15/16 1139  BP:    Pulse: 78 61  Resp:    Temp:    SpO2:      Last Pain: There were no vitals filed for this visit.       Complications: No apparent anesthesia complications

## 2016-11-16 ENCOUNTER — Inpatient Hospital Stay (HOSPITAL_COMMUNITY): Payer: Medicare HMO

## 2016-11-16 ENCOUNTER — Encounter (HOSPITAL_COMMUNITY): Payer: Self-pay | Admitting: Physician Assistant

## 2016-11-16 ENCOUNTER — Other Ambulatory Visit: Payer: Self-pay

## 2016-11-16 ENCOUNTER — Ambulatory Visit: Payer: Self-pay | Admitting: Cardiovascular Disease

## 2016-11-16 DIAGNOSIS — I371 Nonrheumatic pulmonary valve insufficiency: Secondary | ICD-10-CM

## 2016-11-16 DIAGNOSIS — I340 Nonrheumatic mitral (valve) insufficiency: Secondary | ICD-10-CM

## 2016-11-16 DIAGNOSIS — Z952 Presence of prosthetic heart valve: Secondary | ICD-10-CM

## 2016-11-16 DIAGNOSIS — I350 Nonrheumatic aortic (valve) stenosis: Secondary | ICD-10-CM

## 2016-11-16 DIAGNOSIS — I361 Nonrheumatic tricuspid (valve) insufficiency: Secondary | ICD-10-CM

## 2016-11-16 LAB — ECHOCARDIOGRAM COMPLETE
AO mean calculated velocity dopler: 237 cm/s
AOPV: 0.4 m/s
AOVTI: 74.1 cm
AV Area VTI: 1.02 cm2
AV Area mean vel: 1.05 cm2
AV Mean grad: 26 mmHg
AV VEL mean LVOT/AV: 0.41
AV area mean vel ind: 0.68 cm2/m2
AV peak Index: 0.66
AVA: 1.09 cm2
AVAREAVTIIND: 0.7 cm2/m2
AVLVOTPG: 8 mmHg
AVPG: 49 mmHg
AVPHT: 349 ms
AVPKVEL: 350 cm/s
CHL CUP AV VALUE AREA INDEX: 0.7
CHL CUP AV VEL: 1.09
CHL CUP MV DEC (S): 243
CHL CUP RV SYS PRESS: 69 mmHg
E decel time: 243 msec
E/e' ratio: 30.99
FS: 35 % (ref 28–44)
HEIGHTINCHES: 60 in
IV/PV OW: 1.2
LA diam end sys: 46 mm
LADIAMINDEX: 2.97 cm/m2
LASIZE: 46 mm
LAVOL: 129 mL
LAVOLA4C: 92 mL
LAVOLIN: 83.2 mL/m2
LVEEAVG: 30.99
LVEEMED: 30.99
LVELAT: 4.13 cm/s
LVOT SV: 81 mL
LVOT VTI: 31.7 cm
LVOT area: 2.54 cm2
LVOT diameter: 18 mm
LVOT peak vel: 140 cm/s
LVOTVTI: 0.43 cm
MVPG: 7 mmHg
MVPKAVEL: 150 m/s
MVPKEVEL: 128 m/s
PW: 14.3 mm — AB (ref 0.6–1.1)
RV TAPSE: 30 mm
Reg peak vel: 392 cm/s
TDI e' lateral: 4.13
TDI e' medial: 3.7
TR max vel: 392 cm/s
Weight: 2045.87 oz

## 2016-11-16 LAB — BASIC METABOLIC PANEL
Anion gap: 7 (ref 5–15)
BUN: 16 mg/dL (ref 6–20)
CHLORIDE: 104 mmol/L (ref 101–111)
CO2: 28 mmol/L (ref 22–32)
CREATININE: 0.62 mg/dL (ref 0.44–1.00)
Calcium: 8.9 mg/dL (ref 8.9–10.3)
GFR calc non Af Amer: >60 mL/min (ref >60)
GLUCOSE: 137 mg/dL — AB (ref 65–99)
Potassium: 3.8 mmol/L (ref 3.5–5.1)
Sodium: 139 mmol/L (ref 135–145)

## 2016-11-16 LAB — CBC
HCT: 34.1 % — ABNORMAL LOW (ref 36.0–46.0)
Hemoglobin: 11.1 g/dL — ABNORMAL LOW (ref 12.0–15.0)
MCH: 30.2 pg (ref 26.0–34.0)
MCHC: 32.6 g/dL (ref 30.0–36.0)
MCV: 92.9 fL (ref 78.0–100.0)
PLATELETS: 155 10*3/uL (ref 150–400)
RBC: 3.67 MIL/uL — ABNORMAL LOW (ref 3.87–5.11)
RDW: 14.9 % (ref 11.5–15.5)
WBC: 9.4 10*3/uL (ref 4.0–10.5)

## 2016-11-16 LAB — MAGNESIUM: Magnesium: 1.5 mg/dL — ABNORMAL LOW (ref 1.7–2.4)

## 2016-11-16 MED ORDER — DEXTROSE 5 % IV SOLN
1.5000 g | Freq: Two times a day (BID) | INTRAVENOUS | Status: DC
  Administered 2016-11-16 (×2): 1.5 g via INTRAVENOUS
  Filled 2016-11-16 (×3): qty 1.5

## 2016-11-16 MED ORDER — FUROSEMIDE 10 MG/ML IJ SOLN
20.0000 mg | Freq: Once | INTRAMUSCULAR | Status: AC
  Administered 2016-11-16: 20 mg via INTRAVENOUS
  Filled 2016-11-16: qty 2

## 2016-11-16 MED ORDER — LISINOPRIL 10 MG PO TABS
10.0000 mg | ORAL_TABLET | Freq: Every day | ORAL | Status: DC
  Filled 2016-11-16: qty 1

## 2016-11-16 NOTE — Progress Notes (Signed)
PHARMACY NOTE:  ANTIMICROBIAL RENAL DOSAGE ADJUSTMENT  Current antimicrobial regimen includes a mismatch between antimicrobial dosage and estimated renal function.  As per policy approved by the Pharmacy & Therapeutics and Medical Executive Committees, the antimicrobial dosage will be adjusted accordingly.  Current antimicrobial dosage:  Cefuroxime 1.5g IV every 24 hours x2 doses  Indication: Post-op TAVR surgical prophylaxis  Renal Function: eCrCl ~16 ml/min improved to CrCl ~37 ml/min  Estimated Creatinine Clearance: 37.3 mL/min (by C-G formula based on SCr of 0.62 mg/dL). []      On intermittent HD, scheduled: []      On CRRT    Antimicrobial dosage has been changed to:  Cefuroxime 1.5g IV every 12 hours x3 additional doses  Additional comments: n/a  Thank you for allowing pharmacy to be a part of this patient's care.  Arrie Senate, PharmD PGY-2 Cardiology Pharmacy Resident Pager: [phone] 11/16/2016

## 2016-11-16 NOTE — Progress Notes (Signed)
Attempted report at 65 and nurse wasn't aware of assignment. Rn to return call. Attempted report again at 1710 and nurse unavailable. Continue to await return call. [name]

## 2016-11-16 NOTE — Progress Notes (Signed)
  Echocardiogram 2D Echocardiogram has been performed.  [name] F 11/16/2016, 11:28 AM

## 2016-11-16 NOTE — Progress Notes (Signed)
Transferred from 2 heart via wheelchair; patient is alert and oreinted, accompanied by family. Placed safely in bed, CBG bath done by patient care technician. New gown placed. Vitals taken.

## 2016-11-16 NOTE — Progress Notes (Signed)
[place] TEAM  Patient Name: [name] Date of Encounter: 11/16/2016  Primary Cardiologist: Dr. Rockey Situ / Dr. Burt Knack (TAVR)  Hospital Problem List     Principal Problem:   Severe aortic stenosis Active Problems:   Diabetes type 2, controlled ([place])   Chronic diastolic CHF (congestive heart failure) (H[facility])   Hypertension   Hyperlipidemia   PSVT (paroxysmal supraventricular tachycardia) (H[facility])   Cognitive impairment   Gout   Multiple lacunar infarcts (H[facility])   Obesity   Recurrent UTI   Uterine prolapse     Subjective   No complaints. Feeling well. Up eating breakfast  Inpatient Medications    Scheduled Meds: . allopurinol  100 mg Oral Daily  . aspirin  81 mg Oral Daily  . brimonidine  1 drop Both Eyes BID  . chlorhexidine  15 mL Mouth/Throat NOW  . cholecalciferol  1,000 Units Oral Daily  . clopidogrel  75 mg Oral Q breakfast  . dorzolamide-timolol  1 drop Both Eyes BID  . feeding supplement (ENSURE ENLIVE)  237 mL Oral BID BM  . feeding supplement (PRO-STAT SUGAR FREE 64)  30 mL Oral TID WC  . furosemide  20 mg Intravenous Once  . gabapentin  300 mg Oral BID  . iron polysaccharides  150 mg Oral BID  . isosorbide mononitrate  30 mg Oral Daily  . latanoprost  1 drop Right Eye QHS  . [START ON 11/17/2016] lisinopril  10 mg Oral Daily  . LORazepam  0.5 mg Oral QHS  . metoprolol succinate  25 mg Oral Daily  . pantoprazole  40 mg Oral Daily  . pilocarpine  1 drop Right Eye QID  . potassium chloride  10 mEq Oral Daily  . pravastatin  40 mg Oral q1800  . sertraline  50 mg Oral Daily  . sodium chloride flush  3 mL Intravenous Q12H  . traZODone  100 mg Oral QHS   Continuous Infusions: . sodium chloride    . albumin human    . cefUROXime (ZINACEF)  IV Stopped (11/15/16 2300)  . lactated ringers    . nitroGLYCERIN 0 mcg/min (11/15/16 1747)  . phenylephrine (NEO-SYNEPHRINE) Adult infusion     PRN  Meds: sodium chloride, acetaminophen, albumin human, furosemide, HYDROcodone-acetaminophen, lactated ringers, metoprolol tartrate, midazolam, morphine injection, ondansetron (ZOFRAN) IV, oxyCODONE, senna-docusate, sodium chloride flush, tiZANidine, traMADol, Zinc Oxide   Vital Signs    Vitals:   11/16/16 0400 11/16/16 0700 11/16/16 0800 11/16/16 0900  BP: 137/62 139/60 (!) 124/57 (!) 141/53  Pulse: 68 73 66 78  Resp: 17 (!) 22 15 (!) 24  Temp:    98.2 F (36.8 C)  TempSrc:    Oral  SpO2: 98% 98% 99% 100%  Weight:      Height:        Intake/Output Summary (Last 24 hours) at 11/16/16 1043 Last data filed at 11/15/16 2300  Gross per 24 hour  Intake          1843.31 ml  Output             1050 ml  Net           793.31 ml   Filed Weights   11/15/16 1345  Weight: 127 lb 13.9 oz (58 kg)    Physical Exam    GEN: Well nourished, well developed, in no acute distress.  HEENT: Grossly normal.  Neck: Supple, no JVD, carotid bruits, or masses. Cardiac: RRR, 2/6 SEM @  RUSB, rubs, or gallops. No clubbing, cyanosis, edema.  Radials/DP/PT 2+ and equal bilaterally.  Respiratory:  Respirations regular and unlabored, clear to auscultation bilaterally. GI: Soft, nontender, nondistended, BS + x 4. MS: no deformity or atrophy. Skin: warm and dry, no rash. Neuro:  Strength and sensation are intact. Psych: AAOx3.  Normal affect.  Labs    CBC  Recent Labs  11/15/16 1241  11/15/16 1347 11/16/16 0432  WBC 6.6  --   --  9.4  HGB 10.2*  < > 11.9* 11.1*  HCT 31.2*  < > 35.0* 34.1*  MCV 93.1  --   --  92.9  PLT 138*  --   --  155  < > = values in this interval not displayed. Basic Metabolic Panel  Recent Labs  11/14/16 1047  11/15/16 1347 11/16/16 0432  NA 140  < > 142 139  K 3.4*  < > 3.9 3.8  CL 105  < > 109 104  CO2 27  --   --  28  GLUCOSE 126*  < > 83 137*  BUN 31*  < > 29* 16  CREATININE 0.66  < > 1.90* 0.62  CALCIUM 9.3  --   --  8.9  MG  --   --   --  1.5*  < > =  values in this interval not displayed. Liver Function Tests  Recent Labs  11/14/16 1047  AST 30  ALT 20  ALKPHOS 35*  BILITOT 0.8  PROT 6.1*  ALBUMIN 3.5   No results for input(s): LIPASE, AMYLASE in the last 72 hours. Cardiac Enzymes No results for input(s): CKTOTAL, CKMB, CKMBINDEX, TROPONINI in the last 72 hours. BNP Invalid input(s): POCBNP D-Dimer No results for input(s): DDIMER in the last 72 hours. Hemoglobin A1C  Recent Labs  11/14/16 1047  HGBA1C 6.0*   Fasting Lipid Panel No results for input(s): CHOL, HDL, LDLCALC, TRIG, CHOLHDL, LDLDIRECT in the last 72 hours. Thyroid Function Tests No results for input(s): TSH, T4TOTAL, T3FREE, THYROIDAB in the last 72 hours.  Invalid input(s): FREET3  Telemetry    Sinus - Personally Reviewed  ECG    NSR with new LBBB - Personally Reviewed  Radiology    Dg Chest Port 1 View  Result Date: 11/15/2016 CLINICAL DATA:  Aortic valve replacement. EXAM: PORTABLE CHEST 1 VIEW COMPARISON:  11/14/2016. FINDINGS: Right IJ catheter noted with tip over the cavoatrial junction. Prior cardiac valve repair. Cardiomegaly with pulmonary vascular prominence and bilateral interstitial prominence consistent with congestive heart failure. Small left pleural effusion. No pneumothorax . IMPRESSION: 1. Right IJ line in stable position. 2. Prior cardiac valve replacement. Cardiomegaly with mild pulmonary vascular prominence and interstitial prominence consistent with CHF. Electronically Signed   By: Marcello Moores  Register   On: 11/15/2016 13:08    Cardiac Studies   TAVR OPERATIVE NOTE  Date of Procedure:                11/15/2016 Procedure:        Transcatheter Aortic Valve Replacement - Percutaneous Right Transfemoral Approach             Edwards Sapien 3 THV (size 23 mm, model # 9600TFX, serial # [id number])             Pre-operative Echo Findings: ? Severe aortic stenosis and moderate aortic insufficiency ?  Normal left ventricular  systolic function  Post-operative Echo Findings: ? Mild central AI ? Mild left ventricular systolic dysfunction  Post operative echo pending  Patient Profile     [name] is a 83 y.o. female with a history of chronic diastolic CHF, SVT, anemia, DMT2, HTN and severe AS with mod AI who presented to [facility] on 11/15/16 for planned TAVR.    Assessment & Plan    Severe AS: s/p successful TAVR with a 67mm Edwards Sapien 3 THV via R TF approach on 11/15/16. Groin sites stable. Post operative echo pending. ECG with new LBBB but no high grade block. Continue ASA/plavix. Will discontinue central line and transfer to the floor. Plan for early ambulation and discharge home tomorrow.   HTN: BP labile, but now better controlled with resumption of home medications  Chronic diastolic CHF: CXR yesterday with some CHF. I will give her one dose of IV lasix 20mg  . She has her home PRN PO lasix ordered as well.   SVT: resumed on home Toprol XL  DMT2: diet controlled. BG has been okay.    Signed, Angelena Form, PA-C  11/16/2016, 10:43 AM  Pager [phone]  I have personally seen and examined this patient with Angelena Form, PA-C. I agree with the assessment and plan as outlined above. She is doing well post TAVR. Bilateral groins stable without hematoma. She is on ASA and Plavix. Echo today to assess valve. D/c central line and transfer to telemetry.   Lauree Chandler 11/16/2016 10:46 AM'

## 2016-11-17 LAB — TYPE AND SCREEN
ABO/RH(D): A POS
ANTIBODY SCREEN: NEGATIVE
UNIT DIVISION: 0
Unit division: 0

## 2016-11-17 LAB — BPAM RBC
BLOOD PRODUCT EXPIRATION DATE: 201810082359
Blood Product Expiration Date: 201810082359
ISSUE DATE / TIME: 201809181011
ISSUE DATE / TIME: 201809181011
UNIT TYPE AND RH: 6200
Unit Type and Rh: 6200

## 2016-11-17 LAB — BASIC METABOLIC PANEL
ANION GAP: 9 (ref 5–15)
BUN: 13 mg/dL (ref 6–20)
CALCIUM: 9.2 mg/dL (ref 8.9–10.3)
CHLORIDE: 105 mmol/L (ref 101–111)
CO2: 25 mmol/L (ref 22–32)
CREATININE: 0.65 mg/dL (ref 0.44–1.00)
GFR calc non Af Amer: >60 mL/min (ref >60)
Glucose, Bld: 115 mg/dL — ABNORMAL HIGH (ref 65–99)
Potassium: 3.5 mmol/L (ref 3.5–5.1)
SODIUM: 139 mmol/L (ref 135–145)

## 2016-11-17 MED ORDER — CLOPIDOGREL BISULFATE 75 MG PO TABS: 75.0000 mg | ORAL_TABLET | Freq: Every day | ORAL | 1 refills | Status: DC

## 2016-11-17 NOTE — Progress Notes (Signed)
CARDIAC REHAB PHASE I   PRE:  Rate/Rhythm: 76 SR  BP:  Supine:   Sitting: 166/63  Standing:    SaO2: 100%RA  MODE:  Ambulation: 100 ft   POST:  Rate/Rhythm: 86  BP:  Supine:   Sitting: 180/70  Standing:    SaO2: 97%RA 1129-1202 Pt walked 100 ft on RA with gait belt use. Has fast pace. Encouraged her to slow down. Daughter stated her and her sister have been taking turns staying with her and that they assist her when up. Discussed CRP 2 with daughter and will refer to [facility] program. Encouraged walking as tolerated with family.    Graylon Good, RN BSN  11/17/2016 12:00 PM

## 2016-11-17 NOTE — Discharge Summary (Signed)
[facility] TEAM   Discharge Summary    Patient ID: [name],  MRN: [id number], DOB/AGE: 09-21-1924 83 y.o.  Admit date: 11/15/2016 Discharge date: 11/17/2016  Primary Care Provider: Tegeler, Loma Sousa Primary Cardiologist: Dr. Rockey Situ / Dr. Burt Knack (TAVR)   Discharge Diagnoses    Principal Problem:   Severe aortic stenosis Active Problems:   Diabetes type 2, controlled ([place])   Chronic diastolic CHF (congestive heart failure) ([place])   Hypertension   Hyperlipidemia   PSVT (paroxysmal supraventricular tachycardia) ([facility])   Cognitive impairment   Gout   Multiple lacunar infarcts ([facility])   Obesity   Recurrent UTI   Uterine prolapse   Allergies Allergies  Allergen Reactions  . Metformin And Related Diarrhea     History of Present Illness     [name] is a 83 y.o. female with a history of chronic diastolic CHF, SVT, anemia, non obst CAD, pulm HTN, DMT2, HTN and severe AS with mod AI who presented to [facility] on 11/15/16 for planned TAVR.    2D ECHO 02/2016 showed severe AS with mild-mod AI. AVA 0.94, mean gradient 60 mm Hg and peak gradient 97 mm Hg. She has experienced progressive dyspnea with exertion, NYHA Functional Class 3 symptoms of chronic diastolic heart failure. She has been hospitalized with heart failure exacerbation within the past few months as well. L/RHC in 09/2016 showed non obst CAD with severe R PDA stenosis appropriate for medical therapy, severe pulmonary HTN, and severe AS. During the course of the patient's preoperative work up they have been evaluated comprehensively by a multidisciplinary team of specialists coordinated through the [facility] in the [facility]. She has been demonstrated to suffer from symptomatic severe aortic stenosis as noted above and felt to be a suitable candidate for TAVR. This was set up for 11/15/16.   Hospital Course      Consultants: none  Severe AS: s/p successful TAVR with a 69m Edwards Sapien 3 THV via R TF approach on 11/15/16. Groin sites stable. Post operative echo showed well seated valve with mod AI. ECG with new LBBB but no high grade block. Continue ASA/plavix. Plan for discharge home today with repeat echo in 1 month.  HTN: BP labile, but now better controlled with resumption of home medications  Chronic diastolic CHF with mod pulm HTN: appears euvolemic. Continue home PRN PO lasix   SVT: resumed on home Toprol XL  DMT2: diet controlled. BG has been okay.    The patient has had an uncomplicated hospital course and is recovering well. The femoral catheter sites are stable. She has been seen by Dr. CBurt Knacktoday and deemed ready for discharge home. All follow-up appointments have been scheduled. Discharge medications are listed below.  _____________  Discharge Vitals Blood pressure (!) 122/59, pulse 77, temperature 98.2 F (36.8 C), resp. rate 16, height 5' (1.524 m), weight 127 lb 13.9 oz (58 kg), SpO2 98 %.  Filed Weights   11/15/16 1345  Weight: 127 lb 13.9 oz (58 kg)   GEN: Well nourished, well developed, in no acute distress.  HEENT: Grossly normal.  Neck: Supple, no JVD, carotid bruits, or masses. Cardiac: RRR, 2/6 SEM @ RUSB, rubs, or gallops. No clubbing, cyanosis, edema.  Radials/DP/PT 2+ and equal bilaterally.  Respiratory:  Respirations regular and unlabored, clear to auscultation bilaterally. GI: Soft, nontender, nondistended, BS + x 4. MS: no deformity or atrophy. Skin: warm and dry, no  rash. Neuro:  Strength and sensation are intact. Psych: AAOx3.  Normal affect.   Labs & Radiologic Studies     CBC  Recent Labs  11/15/16 1241  11/15/16 1347 11/16/16 0432  WBC 6.6  --   --  9.4  HGB 10.2*  < > 11.9* 11.1*  HCT 31.2*  < > 35.0* 34.1*  MCV 93.1  --   --  92.9  PLT 138*  --   --  155  < > = values in this interval not displayed. Basic Metabolic  Panel  Recent Labs  11/16/16 0432 11/17/16 0838  NA 139 139  K 3.8 3.5  CL 104 105  CO2 28 25  GLUCOSE 137* 115*  BUN 16 13  CREATININE 0.62 0.65  CALCIUM 8.9 9.2  MG 1.5*  --    Liver Function Tests  Recent Labs  11/14/16 1047  AST 30  ALT 20  ALKPHOS 35*  BILITOT 0.8  PROT 6.1*  ALBUMIN 3.5   No results for input(s): LIPASE, AMYLASE in the last 72 hours. Cardiac Enzymes No results for input(s): CKTOTAL, CKMB, CKMBINDEX, TROPONINI in the last 72 hours. BNP Invalid input(s): POCBNP D-Dimer No results for input(s): DDIMER in the last 72 hours. Hemoglobin A1C  Recent Labs  11/14/16 1047  HGBA1C 6.0*   Fasting Lipid Panel No results for input(s): CHOL, HDL, LDLCALC, TRIG, CHOLHDL, LDLDIRECT in the last 72 hours. Thyroid Function Tests No results for input(s): TSH, T4TOTAL, T3FREE, THYROIDAB in the last 72 hours.  Invalid input(s): FREET3  Dg Chest 2 View  Result Date: 11/14/2016 CLINICAL DATA:  Preop aortic valve replacement EXAM: CHEST  2 VIEW COMPARISON:  CT chest 11/03/2016 FINDINGS: There is no focal parenchymal opacity. There is no pleural effusion or pneumothorax. There is stable cardiomegaly. The osseous structures are unremarkable. IMPRESSION: No active cardiopulmonary disease. Electronically Signed   By: Kathreen Devoid   On: 11/14/2016 10:01   Ir US Guide Vasc Access Right  Result Date: 11/04/2016 INDICATION: Poor venous access. Need for large bore IV for contrast injection for imaging. EXAM: ULTRASOUND GUIDED IV INSERTION MEDICATIONS: 1% Lidocaine = 2 mL FLUOROSCOPY TIME:  None COMPLICATIONS: None immediate. TECHNIQUE: The procedure, risks, benefits, and alternatives were explained to the patient and informed written consent was obtained. The right upper extremity was prepped with chlorhexidine in a sterile fashion, and a sterile drape was applied covering the operative field. Maximum barrier sterile technique with sterile gowns and gloves were used for the  procedure. A timeout was performed prior to the initiation of the procedure. Local anesthesia was provided with 1% lidocaine. Under direct ultrasound guidance, the basilic vein was accessed with a micropuncture kit after the overlying soft tissues were anesthetized with 1% lidocaine. After the overlying soft tissues were anesthetized, a micropuncture kit was utilized to access the right basilic vein. Real-time ultrasound guidance was utilized for vascular access including the acquisition of a permanent ultrasound image documenting patency of the accessed vessel. The micropuncture catheter easily aspirated and flushed and was secured in place. A dressing was placed. The patient tolerated the procedure well without immediate post procedural complication. FINDINGS: The catheter aspirates and flushes normally and is ready for immediate use. IMPRESSION: Successful ultrasound and fluoroscopic guided placement of a right basilic vein approach. The IV is ready for immediate use and may be removed after imaging procedure. Read by:  Gareth Eagle, PA-C Electronically Signed   By: Jacqulynn Cadet M.D.   On: 11/03/2016 14:55  Ct Coronary Morph W/cta Cor W/score W/ca W/cm &/or Wo/cm  Addendum Date: 11/04/2016   ADDENDUM REPORT: 11/04/2016 16:30 EXAM: OVER-READ INTERPRETATION  CT CHEST The following report is an over-read performed by radiologist Dr. Rebekah Chesterfield [facility] on 11/04/2016. This over-read does not include interpretation of cardiac or coronary anatomy or pathology. The coronary CTA interpretation by the cardiologist is attached. COMPARISON:  None. FINDINGS: Described separately under dictation for contemporaneously obtained CTA of the chest, abdomen and pelvis. IMPRESSION: Please see separate dictation for contemporaneously obtained CTA of the chest, abdomen and pelvis for full description of relevant extracardiac findings. Electronically Signed   By: Vinnie Langton M.D.   On: 11/04/2016 16:30    Result Date: 11/04/2016 CLINICAL DATA:  Aortic Stenosis EXAM: Cardiac TAVR CT TECHNIQUE: The patient was scanned on a Siemens Force 299 slice scanner. A 120 kV retrospective scan was triggered in the ascending thoracic aorta at 140 HU's. Gantry rotation speed was 250 msecs and collimation was .6 mm. No beta blockade or nitro were given. The 3D data set was reconstructed in 5% intervals of the R-R cycle. Systolic and diastolic phases were analyzed on a dedicated work station using MPR, MIP and VRT modes. The patient received 80 cc of contrast. FINDINGS: Aortic Valve:  Tri leaflet and severely calcified Aorta: Mild aortic root dilatation Normal arch vessels moderate calcified aortic debris in arch and descending thoracic aorta Sino-tubular Junction:  30 mm Ascending Thoracic Aorta:  38 mm Aortic Arch:  27 mm Descending Thoracic Aorta:  25 mm Sinus of Valsalva Measurements: Non-coronary:  29 mm Right - coronary:  30.8 mm Left -   coronary:  30.5 mm Coronary Artery Height above Annulus: Left Main:  Shallow only 10 mm above annulus Right Coronary:  11.6 mm Virtual Basal Annulus Measurements: Maximum / Minimum Diameter:  26.2 mm x 19.3 mm Perimeter:  75 mm Area:  408 mm2 Coronary Arteries:  Shallow LM ostium above annulus Optimum Fluoroscopic Angle for Delivery: LAO 12 degrees Caudal 11 degrees IMPRESSION: 1) Severely calcified tri leaflet aortic valve with annular area suitable for a 23 mm Sapien 3 valve. Perimeter measures for 29 mm Evolut Pro but shallow LM and Sinus height less than 15 mm would suggest 26 mm valve would be better 2) No LAA thrombus 3) Optimum angle for deployment LAO 12 degrees Caudal 11 degrees 4) Shallow LM height above annulus 10 mm 5) Moderate calcific aortic debris in arch and descending thoracic aorta Jenkins Rouge Electronically Signed: By: Jenkins Rouge M.D. On: 11/03/2016 16:16   Dg Chest Port 1 View  Result Date: 11/15/2016 CLINICAL DATA:  Aortic valve replacement. EXAM: PORTABLE CHEST  1 VIEW COMPARISON:  11/14/2016. FINDINGS: Right IJ catheter noted with tip over the cavoatrial junction. Prior cardiac valve repair. Cardiomegaly with pulmonary vascular prominence and bilateral interstitial prominence consistent with congestive heart failure. Small left pleural effusion. No pneumothorax . IMPRESSION: 1. Right IJ line in stable position. 2. Prior cardiac valve replacement. Cardiomegaly with mild pulmonary vascular prominence and interstitial prominence consistent with CHF. Electronically Signed   By: Marcello Moores  Register   On: 11/15/2016 13:08   Ct Angio Chest Aorta W &/or Wo Contrast  Result Date: 11/04/2016 CLINICAL DATA:  83 year old female with history of severe aortic stenosis. Preprocedural study prior to potential transcatheter aortic valve replacement (TAVR) procedure. EXAM: CT ANGIOGRAPHY CHEST, ABDOMEN AND PELVIS TECHNIQUE: Multidetector CT imaging through the chest, abdomen and pelvis was performed using the standard protocol during bolus administration of  intravenous contrast. Multiplanar reconstructed images and MIPs were obtained and reviewed to evaluate the vascular anatomy. CONTRAST:  100 mL of Isovue 370. COMPARISON:  None. FINDINGS: CTA CHEST FINDINGS Cardiovascular: Heart size is enlarged. There is no significant pericardial fluid, thickening or pericardial calcification. Severe thickening calcification of the aortic valve. There is aortic atherosclerosis, as well as atherosclerosis of the great vessels of the mediastinum and the coronary arteries, including calcified atherosclerotic plaque in the left anterior descending, left circumflex and right coronary arteries. Mediastinum/Lymph Nodes: No pathologically enlarged mediastinal or hilar lymph nodes. Esophagus is unremarkable in appearance. No axillary lymphadenopathy. Lungs/Pleura: A few scattered tiny pulmonary nodules noted throughout the lungs bilaterally, the largest of which measures 6 x 4 mm (mean diameter 5 mm) 80 apex of  the left upper lobe (axial image 18 of series 16). No acute consolidative airspace disease. Small left pleural effusion. Small amount of pleural fluid loculated in the superior aspect of the left major fissure. Musculoskeletal/Soft Tissues: There are no aggressive appearing lytic or blastic lesions noted in the visualized portions of the skeleton. CTA ABDOMEN AND PELVIS FINDINGS Hepatobiliary: No cystic or solid hepatic lesions. No intra or extrahepatic biliary ductal dilatation. Gallbladder is normal in appearance. Pancreas: No pancreatic mass. No pancreatic ductal dilatation. No pancreatic or peripancreatic fluid or inflammatory changes. Spleen: Unremarkable. Adrenals/Urinary Tract: Bilateral kidneys and bilateral adrenal glands are normal in appearance. No hydroureteronephrosis. Urinary bladder is normal in appearance. Stomach/Bowel: Normal appearance of the stomach. No pathologic dilatation of small bowel or colon. Numerous colonic diverticulae are noted, without surrounding inflammatory changes to suggest an acute diverticulitis at this time. The appendix is not confidently identified and may be surgically absent. Regardless, there are no inflammatory changes noted adjacent to the cecum to suggest the presence of an acute appendicitis at this time. Vascular/Lymphatic: Aortic atherosclerosis, with vascular findings and measurements pertinent to potential TAVR procedure, as detailed below. No aneurysm or dissection noted in the abdominal or pelvic vasculature. Moderate stenosis of the ostium of the celiac axis. Mild stenosis of the ostium of the superior mesenteric artery. Inferior mesenteric artery is widely patent. Renal arteries bilaterally appear patent without hemodynamically significant stenosis. No lymphadenopathy noted in the abdomen or pelvis. Reproductive: Uterus and ovaries are atrophic. Other: Trace volume of ascites.  No pneumoperitoneum. Musculoskeletal: There are no aggressive appearing lytic or  blastic lesions noted in the visualized portions of the skeleton. VASCULAR MEASUREMENTS PERTINENT TO TAVR: AORTA: Minimal Aortic Diameter -  13 x 13 mm Severity of Aortic Calcification -  mild to moderate RIGHT PELVIS: Right Common Iliac Artery - Minimal Diameter - 8.9 x 6.0 mm Tortuosity - mild Calcification - mild moderate Right External Iliac Artery - Minimal Diameter - 7.6 x 7.6 mm Tortuosity - mild Calcification - none Right Common Femoral Artery - Minimal Diameter - 7.2 x 6.1 mm Tortuosity - mild Calcification - mild LEFT PELVIS: Left Common Iliac Artery - Minimal Diameter - 8.8 x 8.0 mm Tortuosity - mild Calcification - mild to moderate Left External Iliac Artery - Minimal Diameter - 6.9 x 6.7 mm Tortuosity - mild Calcification - none Left Common Femoral Artery - Minimal Diameter - 6.0 x 5.8 mm Tortuosity - mild Calcification - none Review of the MIP images confirms the above findings. IMPRESSION: 1. Vascular findings and measurements pertinent to potential TAVR procedure, as detailed above. This patient does have suitable pelvic arterial access bilaterally. 2. Severe thickening calcification of the aortic valve, compatible with the reported clinical history of  severe aortic stenosis. 3. Small left pleural effusion. 4. A few scattered tiny pulmonary nodules are noted in the lungs bilaterally measuring 5 mm or less in size. These are nonspecific, but are statistically likely benign. No follow-up needed if patient is low-risk (and has no known or suspected primary neoplasm). Non-contrast chest CT can be considered in 12 months if patient is high-risk. This recommendation follows the consensus statement: Guidelines for Management of Incidental Pulmonary Nodules Detected on CT Images: From the Fleischner Society 2017; Radiology 2017; 284:228-243. 5. Cardiomegaly. 6. Colonic diverticulosis without evidence to suggest an acute diverticulitis at this time. 7. Additional incidental findings, as above. Aortic  Atherosclerosis (ICD10-I70.0). Electronically Signed   By: Vinnie Langton M.D.   On: 11/04/2016 14:50   [facility] Peripheral Guided Iv Start  Result Date: 11/03/2016 INDICATION: Poor venous access. Need for large bore IV for contrast injection for imaging. EXAM: ULTRASOUND GUIDED IV INSERTION MEDICATIONS: 1% Lidocaine = 2 mL FLUOROSCOPY TIME:  None COMPLICATIONS: None immediate. TECHNIQUE: The procedure, risks, benefits, and alternatives were explained to the patient and informed written consent was obtained. The right upper extremity was prepped with chlorhexidine in a sterile fashion, and a sterile drape was applied covering the operative field. Maximum barrier sterile technique with sterile gowns and gloves were used for the procedure. A timeout was performed prior to the initiation of the procedure. Local anesthesia was provided with 1% lidocaine. Under direct ultrasound guidance, the basilic vein was accessed with a micropuncture kit after the overlying soft tissues were anesthetized with 1% lidocaine. After the overlying soft tissues were anesthetized, a micropuncture kit was utilized to access the right basilic vein. Real-time ultrasound guidance was utilized for vascular access including the acquisition of a permanent ultrasound image documenting patency of the accessed vessel. The micropuncture catheter easily aspirated and flushed and was secured in place. A dressing was placed. The patient tolerated the procedure well without immediate post procedural complication. FINDINGS: The catheter aspirates and flushes normally and is ready for immediate use. IMPRESSION: Successful ultrasound and fluoroscopic guided placement of a right basilic vein approach. The IV is ready for immediate use and may be removed after imaging procedure. Read by:  Gareth Eagle, PA-C Electronically Signed   By: Jacqulynn Cadet M.D.   On: 11/03/2016 14:55   Ct Angio Abd/pel W/ And/or W/o  Result Date: 11/04/2016 CLINICAL  DATA:  83 year old female with history of severe aortic stenosis. Preprocedural study prior to potential transcatheter aortic valve replacement (TAVR) procedure. EXAM: CT ANGIOGRAPHY CHEST, ABDOMEN AND PELVIS TECHNIQUE: Multidetector CT imaging through the chest, abdomen and pelvis was performed using the standard protocol during bolus administration of intravenous contrast. Multiplanar reconstructed images and MIPs were obtained and reviewed to evaluate the vascular anatomy. CONTRAST:  100 mL of Isovue 370. COMPARISON:  None. FINDINGS: CTA CHEST FINDINGS Cardiovascular: Heart size is enlarged. There is no significant pericardial fluid, thickening or pericardial calcification. Severe thickening calcification of the aortic valve. There is aortic atherosclerosis, as well as atherosclerosis of the great vessels of the mediastinum and the coronary arteries, including calcified atherosclerotic plaque in the left anterior descending, left circumflex and right coronary arteries. Mediastinum/Lymph Nodes: No pathologically enlarged mediastinal or hilar lymph nodes. Esophagus is unremarkable in appearance. No axillary lymphadenopathy. Lungs/Pleura: A few scattered tiny pulmonary nodules noted throughout the lungs bilaterally, the largest of which measures 6 x 4 mm (mean diameter 5 mm) 80 apex of the left upper lobe (axial image 18 of series 16). No acute  consolidative airspace disease. Small left pleural effusion. Small amount of pleural fluid loculated in the superior aspect of the left major fissure. Musculoskeletal/Soft Tissues: There are no aggressive appearing lytic or blastic lesions noted in the visualized portions of the skeleton. CTA ABDOMEN AND PELVIS FINDINGS Hepatobiliary: No cystic or solid hepatic lesions. No intra or extrahepatic biliary ductal dilatation. Gallbladder is normal in appearance. Pancreas: No pancreatic mass. No pancreatic ductal dilatation. No pancreatic or peripancreatic fluid or inflammatory  changes. Spleen: Unremarkable. Adrenals/Urinary Tract: Bilateral kidneys and bilateral adrenal glands are normal in appearance. No hydroureteronephrosis. Urinary bladder is normal in appearance. Stomach/Bowel: Normal appearance of the stomach. No pathologic dilatation of small bowel or colon. Numerous colonic diverticulae are noted, without surrounding inflammatory changes to suggest an acute diverticulitis at this time. The appendix is not confidently identified and may be surgically absent. Regardless, there are no inflammatory changes noted adjacent to the cecum to suggest the presence of an acute appendicitis at this time. Vascular/Lymphatic: Aortic atherosclerosis, with vascular findings and measurements pertinent to potential TAVR procedure, as detailed below. No aneurysm or dissection noted in the abdominal or pelvic vasculature. Moderate stenosis of the ostium of the celiac axis. Mild stenosis of the ostium of the superior mesenteric artery. Inferior mesenteric artery is widely patent. Renal arteries bilaterally appear patent without hemodynamically significant stenosis. No lymphadenopathy noted in the abdomen or pelvis. Reproductive: Uterus and ovaries are atrophic. Other: Trace volume of ascites.  No pneumoperitoneum. Musculoskeletal: There are no aggressive appearing lytic or blastic lesions noted in the visualized portions of the skeleton. VASCULAR MEASUREMENTS PERTINENT TO TAVR: AORTA: Minimal Aortic Diameter -  13 x 13 mm Severity of Aortic Calcification -  mild to moderate RIGHT PELVIS: Right Common Iliac Artery - Minimal Diameter - 8.9 x 6.0 mm Tortuosity - mild Calcification - mild moderate Right External Iliac Artery - Minimal Diameter - 7.6 x 7.6 mm Tortuosity - mild Calcification - none Right Common Femoral Artery - Minimal Diameter - 7.2 x 6.1 mm Tortuosity - mild Calcification - mild LEFT PELVIS: Left Common Iliac Artery - Minimal Diameter - 8.8 x 8.0 mm Tortuosity - mild Calcification - mild  to moderate Left External Iliac Artery - Minimal Diameter - 6.9 x 6.7 mm Tortuosity - mild Calcification - none Left Common Femoral Artery - Minimal Diameter - 6.0 x 5.8 mm Tortuosity - mild Calcification - none Review of the MIP images confirms the above findings. IMPRESSION: 1. Vascular findings and measurements pertinent to potential TAVR procedure, as detailed above. This patient does have suitable pelvic arterial access bilaterally. 2. Severe thickening calcification of the aortic valve, compatible with the reported clinical history of severe aortic stenosis. 3. Small left pleural effusion. 4. A few scattered tiny pulmonary nodules are noted in the lungs bilaterally measuring 5 mm or less in size. These are nonspecific, but are statistically likely benign. No follow-up needed if patient is low-risk (and has no known or suspected primary neoplasm). Non-contrast chest CT can be considered in 12 months if patient is high-risk. This recommendation follows the consensus statement: Guidelines for Management of Incidental Pulmonary Nodules Detected on CT Images: From the Fleischner Society 2017; Radiology 2017; 284:228-243. 5. Cardiomegaly. 6. Colonic diverticulosis without evidence to suggest an acute diverticulitis at this time. 7. Additional incidental findings, as above. Aortic Atherosclerosis (ICD10-I70.0). Electronically Signed   By: Vinnie Langton M.D.   On: 11/04/2016 14:50     Diagnostic Studies/Procedures    TAVR OPERATIVE NOTE  Date of Procedure:11/15/2016 Procedure:  Transcatheter Aortic Valve Replacement - Percutaneous RightTransfemoral Approach Edwards Sapien 3 THV (size 43m, model # 9600TFX, serial # [id number]  Pre-operative Echo Findings: ? Severe aortic stenosis and moderate aortic insufficiency ? Normalleft ventricular systolic function  Post-operative Echo Findings: ? Mild central AI ? Mild left ventricular systolic  dysfunction _____________   2D ECHO: 11/16/2016 Study Conclusions - Left ventricle: The cavity size was normal. There was severe   focal basal and moderate concentric hypertrophy. Systolic   function was normal. The estimated ejection fraction was in the   range of 60% to 65%. Wall motion was normal; there were no   regional wall motion abnormalities. Features are consistent with   a pseudonormal left ventricular filling pattern, with concomitant   abnormal relaxation and increased filling pressure (grade 2   diastolic dysfunction). Doppler parameters are consistent with   high ventricular filling pressure. - Aortic valve: A bioprosthesis was present. s/p TAVR with a 232m  Sapien 3 AVR. The mean AVG is 2651m and peak AVG is 56m52m By   PHT there is moderate central AI with PHT [id number] by coloflow   doppler this is not very apparent. The AVA is 1.09cm2. Valve area   (VTI): 1.09 cm^2. Valve area (Vmax): 1.02 cm^2. Valve area   (Vmean): 1.05 cm^2. Regurgitation pressure half-time: 349 ms. - Mitral valve: Calcified annulus. Moderate diffuse thickening of   the anterior leaflet and posterior leaflet. Mild focal   calcification of the anterior leaflet, with mild involvement of   chords. There was mild regurgitation. - Left atrium: The atrium was severely dilated. - Right ventricle: The cavity size was severely dilated. Wall   thickness was normal. - Right atrium: The atrium was moderately dilated. - Atrial septum: The septum bowed from left to right, consistent   with increased left atrial pressure. - Tricuspid valve: There was moderate regurgitation. - Pulmonic valve: There was mild regurgitation. - Pulmonary arteries: PA peak pressure: 66 mm Hg (S). - Pericardium, extracardiac: A small, free-flowing pericardial   effusion was identified along the left ventricular free wall. The   fluid had no internal echoes. Impressions: - Compared to prior echo, there is now a TAVR present.  The right   ventricular systolic pressure was increased consistent with   moderate pulmonary hypertension.  Disposition   Pt is being discharged home today in good condition.  Follow-up Plans & Appointments    Follow-up Information    ThompEileen StanfordC. Go on 11/30/2016.   Specialties:  Cardiology, Radiology Why:  @ 1:30pm, please arrive at least 10 minutes early Contact information: [phone]       Discharge Medications     Medication List    TAKE these medications   acetaminophen 500 MG tablet Commonly known as:  TYLENOL Take 500 mg by mouth every 6 (six) hours as needed for mild pain or headache.   allopurinol 100 MG tablet Commonly known as:  ZYLOPRIM Take 100 mg by mouth daily.   aspirin 81 MG chewable tablet Chew 81 mg by mouth daily.   brimonidine 0.1 % Soln Commonly known as:  ALPHAGAN P Place 1 drop into the right eye 2 (two) times daily.   cholecalciferol 1000 units tablet Commonly known as:  VITAMIN D Take 1,000 Units by mouth daily.   clopidogrel 75 MG tablet Commonly known as:  PLAVIX Take 1 tablet (75 mg total) by mouth daily with  breakfast.   COSOPT OP Place 1 drop into the right eye 2 (two) times daily.   feeding supplement (ENSURE ENLIVE) Liqd Take 237 mLs by mouth 2 (two) times daily between meals.   feeding supplement (PRO-STAT SUGAR FREE 64) Liqd Take 30 mLs by mouth 3 (three) times daily with meals.   furosemide 20 MG tablet Commonly known as:  LASIX Take 20-40 mg by mouth daily as needed (for fluid retention (takes with potassium)). At weight of 125lbs or greater patient takes 40 mg   gabapentin 300 MG capsule Commonly known as:  NEURONTIN Take 300 mg by mouth 2 (two) times daily.   hydrALAZINE 50 MG tablet Commonly known as:  APRESOLINE Take 50 mg by mouth 3 (three) times daily as needed (elevated BP).   HYDROcodone-acetaminophen 5-325 MG  tablet Commonly known as:  NORCO/VICODIN Take 1 tablet by mouth every 6 (six) hours as needed for moderate pain.   iron polysaccharides 150 MG capsule Commonly known as:  NIFEREX Take 150 mg by mouth 2 (two) times daily.   isosorbide mononitrate 30 MG 24 hr tablet Commonly known as:  IMDUR Take 1 tablet (30 mg total) by mouth daily.   latanoprost 0.005 % ophthalmic solution Commonly known as:  XALATAN Place 1 drop into the right eye at bedtime.   lidocaine 5 % Commonly known as:  LIDODERM Place 1 patch onto the skin daily as needed (for pain.). Remove & Discard patch within 12 hours or as directed by MD   lisinopril 10 MG tablet Commonly known as:  PRINIVIL,ZESTRIL Take 10 mg by mouth daily. HOLD FOR BLOOD PRESSURE LOWER THAN 90/60   LORazepam 0.5 MG tablet Commonly known as:  ATIVAN Take 1 tablet (0.5 mg total) by mouth at bedtime. Take 1 tablet at bedtime as needed for sleep or anxiety What changed:  how much to take  when to take this  additional instructions   lovastatin 40 MG tablet Commonly known as:  MEVACOR Take 40 mg by mouth at bedtime.   metoprolol succinate 25 MG 24 hr tablet Commonly known as:  TOPROL-XL Take 25 mg by mouth daily.   MULTI-VITAMIN PO Take 1 tablet by mouth daily.   pantoprazole 40 MG tablet Commonly known as:  PROTONIX Take 40 mg by mouth daily.   pilocarpine 4 % ophthalmic solution Commonly known as:  PILOCAR Place 1 drop into the right eye 4 (four) times daily.   potassium chloride 10 MEQ tablet Commonly known as:  K-DUR Take 1 tablet (10 mEq total) by mouth daily. Take with lasix What changed:  when to take this  reasons to take this  additional instructions   senna-docusate 8.6-50 MG tablet Commonly known as:  Senokot-S Take 2 tablets by mouth daily as needed for mild constipation.   sertraline 50 MG tablet Commonly known as:  ZOLOFT Take 50 mg by mouth daily.   tiZANidine 4 MG tablet Commonly known as:   ZANAFLEX Take 1 tablet by mouth 3 (three) times daily as needed for muscle spasms.   traZODone 100 MG tablet Commonly known as:  DESYREL Take 100 mg by mouth at bedtime.   vitamin C 500 MG tablet Commonly known as:  ASCORBIC ACID Take 500 mg by mouth daily.   Zinc Oxide 10 % Oint Apply 1 application topically daily as needed (rash). Applied to patient's bottom         Outstanding Labs/Studies   none  Duration of Discharge Encounter   Greater than 30 minutes  including physician time.  Signed, Angelena Form PA-C 11/17/2016, 10:39 AM  Patient seen, examined. Available data reviewed. Agree with findings, assessment, and plan as outlined by Nell Range, PA-C. On my exam today: Vitals:   11/17/16 1046 11/17/16 1135  BP:  (!) 166/53  Pulse: 74 79  Resp:    Temp: 98.1 F (36.7 C)   SpO2: 99%    Pt is comfortable, resting in bed, in NAD HEENT: normal Neck: JVP - normal Lungs: CTA bilaterally CV: RRR with soft SEM at the RUSB Abd: soft, NT, Positive BS, no hepatomegaly Ext: no C/C/E, distal pulses intact and equal, bilateral groin sites are clear Skin: warm/dry no rash  The patient's daughter is at the bedside. She has done very well with TAVR. She is stable for discharge home today. Plans as outlined above for follow-up. Reviewed the need to take both aspirin 81 mg and Plavix 75 mg daily. Post-TAVR restrictions reviewed with the patient and her daughter.  [name], M.D. 11/17/2016 1:20 PM

## 2016-11-17 NOTE — Care Management Note (Signed)
Case Management Note Marvetta Gibbons RN, BSN Unit 4E-Case Manager [phone]  Patient Details  Name: [name] MRN: [id number] Date of Birth: [birth year]  Subjective/Objective:  Pt admitted s/p TAVR                  Action/Plan: PTA pt lived at home with family- plan to return home- no CM needs noted for discharge.   Expected Discharge Date:  11/17/16               Expected Discharge Plan:  Home/Self Care  In-House Referral:  NA  Discharge planning Services  CM Consult  Post Acute Care Choice:  NA Choice offered to:     DME Arranged:    DME Agency:     HH Arranged:    HH Agency:     Status of Service:  Completed, signed off  If discussed at [place] of Stay Meetings, dates discussed:    Discharge Disposition: home/self care   Additional Comments:  Dawayne Patricia, RN 11/17/2016, 10:46 AM

## 2016-11-17 NOTE — Discharge Instructions (Signed)

## 2016-11-17 NOTE — Progress Notes (Signed)
Discharged to home. Left unit via wheelchair, accompanied by daughter and volunteer transportation services. Verbalized understanding of all written and verbal instruction. Prescription for new medication called in. Understands all follow up appointments, medication list and activities.

## 2016-11-18 MED FILL — Potassium Chloride Inj 2 mEq/ML: INTRAVENOUS | Qty: 40 | Status: AC

## 2016-11-23 MED FILL — Phenylephrine HCl Inj 10 MG/ML: INTRAMUSCULAR | Qty: 2 | Status: AC

## 2016-11-23 MED FILL — Sodium Chloride IV Soln 0.9%: INTRAVENOUS | Qty: 250 | Status: AC

## 2016-11-24 NOTE — Progress Notes (Signed)
[facility]   MULTIDISCIPLINARY HEART VALVE TEAM   Cardiology Office Note    Date:  11/30/2016   ID:  [name], DOB [birth year], MRN [id number]  PCP:  Tegeler, Loma Sousa, MD  Cardiologist:  Dr. Rockey Situ / Dr. Burt Knack (TAVR)  CC: TOC follow up s/p TAVR  History of Present Illness:  [name] is a 83 y.o. female with a history of chronic diastolic CHF, SVT, anemia, non obst CAD, pulm HTN, DMT2, HTN and severe AS with mod AI s/p TAVR on 11/15/16 who presents to clinic for post hospital follow up.   2D ECHO 02/2016 showed severe AS with mild-mod AI. AVA 0.94, mean gradient 60 mm Hg and peak gradient 97 mm Hg. She has experienced progressive dyspnea with exertion, NYHA Functional Class 3 symptoms of chronic diastolic heart failure. She had been hospitalized with heart failure exacerbation within the past few months as well. L/RHC in 09/2016 showed non obst CAD with severe R PDA stenosis appropriate for medical therapy, severe pulmonary HTN, and severe AS.  She underwent successful TAVR with a 61mm Edwards Sapien 3 THV via R TF approach on 11/15/16. Post op course was uncomplicated. She was discharged on ASA and plavix.   Today she presents to clinic for follow up. Patient is here today with her daughter. She is very quiet and has no particular complaints. No CP or SOB. She does have chronic LE edema that seems worse recently and sleeps in a recliner. Weight up above her dry weight and extra lasix doesn't seem to get back to 125lbs. No PND. No dizziness or syncope. No blood in stool or urine. No palpitations. BP has been high at home.     Past Medical History:  Diagnosis Date  . Anemia   . Carotid arterial disease ([place])    a. 08/2014 U/S: Bilateral < 50% stenosis. Patent vertebrals w/ antegrade flow.   . Cervical spine arthritis   . Chronic diastolic CHF (congestive heart failure) ([place])    a. echo 2014: EF 55-60%, DD;  b. 08/2014 Echo: EF 55-60%, no RWMA,  GR1DD; c. 02/2016 Echo: EF 65-70%, Gr1 DD.  Marland Kitchen Cognitive impairment   . Depression   . Diabetes mellitus without complication ([place])   . Essential hypertension   . GERD (gastroesophageal reflux disease)   . Glaucoma   . Gout   . History of renal impairment   . Hyperlipidemia   . Hypotension    a. Related to Norvasc - 11/2014 ED visit.  . Multinodular goiter    a. Noted incidentally on CT 07/2012 and carotid U/S 08/2014;  b. Nl TSH 11/2014.  Marland Kitchen Paroxysmal SVT (supraventricular tachycardia) ([facility])    a. on Toprol   . Prolapsed uterus   . Severe aortic stenosis    a. 11/15/16: s/p TAVR Edwards Sapien 3 THV (size 23 mm, model # 9600TFX, serial # [id number])  . Stroke [facility])    a. CT head 07/2014 showed small thalamic infarct  . Vitamin deficiency     Past Surgical History:  Procedure Laterality Date  . EYE SURGERY     unsure of what procedure, did know that laser was used  . IR RADIOLOGY PERIPHERAL GUIDED IV START  11/03/2016  . IR US GUIDE VASC ACCESS RIGHT  11/03/2016  . RIGHT/LEFT HEART CATH AND CORONARY ANGIOGRAPHY N/A 10/26/2016   Procedure: RIGHT/LEFT HEART CATH AND CORONARY ANGIOGRAPHY;  Surgeon: [name], MD;  Location: [facility];  Service: Cardiovascular;  Laterality: N/A;  .  TEE WITHOUT CARDIOVERSION N/A 03/31/2016   Procedure: TRANSESOPHAGEAL ECHOCARDIOGRAM (TEE);  Surgeon: Minna Merritts, MD;  Location: [facility];  Service: Cardiovascular;  Laterality: N/A;  . TEE WITHOUT CARDIOVERSION N/A 11/15/2016   Procedure: TRANSESOPHAGEAL ECHOCARDIOGRAM (TEE);  Surgeon: [name], MD;  Location: [facility];  Service: Open Heart Surgery;  Laterality: N/A;  . TRANSCATHETER AORTIC VALVE REPLACEMENT, TRANSFEMORAL N/A 11/15/2016   Procedure: TRANSCATHETER AORTIC VALVE REPLACEMENT, TRANSFEMORAL;  Surgeon: [name], MD;  Location: [facility];  Service: Open Heart Surgery;  Laterality: N/A;  . TUBAL LIGATION      Current Medications: Outpatient Medications Prior to Visit  Medication  Sig Dispense Refill  . acetaminophen (TYLENOL) 500 MG tablet Take 500 mg by mouth every 6 (six) hours as needed for mild pain or headache.    . allopurinol (ZYLOPRIM) 100 MG tablet Take 100 mg by mouth daily.    . Amino Acids-Protein Hydrolys (FEEDING SUPPLEMENT, PRO-STAT SUGAR FREE 64,) LIQD Take 30 mLs by mouth 3 (three) times daily with meals.    Marland Kitchen aspirin 81 MG chewable tablet Chew 81 mg by mouth daily.    . brimonidine (ALPHAGAN P) 0.1 % SOLN Place 1 drop into the right eye 2 (two) times daily.    . cholecalciferol (VITAMIN D) 1000 units tablet Take 1,000 Units by mouth daily.    . clopidogrel (PLAVIX) 75 MG tablet Take 1 tablet (75 mg total) by mouth daily with breakfast. 90 tablet 1  . Dorzolamide HCl-Timolol Mal (COSOPT OP) Place 1 drop into the right eye 2 (two) times daily.     . feeding supplement, ENSURE ENLIVE, (ENSURE ENLIVE) LIQD Take 237 mLs by mouth 2 (two) times daily between meals. 237 mL 12  . furosemide (LASIX) 20 MG tablet Take 20-40 mg by mouth daily as needed (for fluid retention (takes with potassium)). At weight of 125lbs or greater patient takes 40 mg    . gabapentin (NEURONTIN) 300 MG capsule Take 300 mg by mouth 2 (two) times daily.    . hydrALAZINE (APRESOLINE) 50 MG tablet Take 50 mg by mouth 3 (three) times daily as needed (elevated BP).    Marland Kitchen HYDROcodone-acetaminophen (NORCO/VICODIN) 5-325 MG tablet Take 1 tablet by mouth every 6 (six) hours as needed for moderate pain. 30 tablet 0  . iron polysaccharides (NIFEREX) 150 MG capsule Take 150 mg by mouth 2 (two) times daily.    . isosorbide mononitrate (IMDUR) 30 MG 24 hr tablet Take 1 tablet (30 mg total) by mouth daily. 30 tablet 0  . latanoprost (XALATAN) 0.005 % ophthalmic solution Place 1 drop into the right eye at bedtime.  3  . lisinopril (PRINIVIL,ZESTRIL) 10 MG tablet Take 10 mg by mouth daily. HOLD FOR BLOOD PRESSURE LOWER THAN 90/60    . LORazepam (ATIVAN) 0.5 MG tablet Take 1 tablet (0.5 mg total) by mouth at  bedtime. Take 1 tablet at bedtime as needed for sleep or anxiety (Patient taking differently: Take 0.5-1 mg by mouth See admin instructions. Take 1 mg by mouth at bedtime and may take and additional  0.5mg  during the day as needed for anxiety) 60 tablet 0  . lovastatin (MEVACOR) 40 MG tablet Take 40 mg by mouth at bedtime.    . metoprolol succinate (TOPROL-XL) 25 MG 24 hr tablet Take 25 mg by mouth daily.    . Multiple Vitamin (MULTI-VITAMIN PO) Take 1 tablet by mouth daily.    . pantoprazole (PROTONIX) 40 MG tablet Take 40 mg by mouth daily.    Marland Kitchen  pilocarpine (PILOCAR) 4 % ophthalmic solution Place 1 drop into the right eye 4 (four) times daily.     . potassium chloride (K-DUR) 10 MEQ tablet Take 1 tablet (10 mEq total) by mouth daily. Take with lasix (Patient taking differently: Take 10 mEq by mouth daily as needed (for fluid retention (takes with furosemide)). ) 30 tablet 6  . senna-docusate (SENOKOT-S) 8.6-50 MG tablet Take 2 tablets by mouth daily as needed for mild constipation.     . sertraline (ZOLOFT) 50 MG tablet Take 50 mg by mouth daily.    . traZODone (DESYREL) 100 MG tablet Take 100 mg by mouth at bedtime.    . vitamin C (ASCORBIC ACID) 500 MG tablet Take 500 mg by mouth daily.    . Zinc Oxide 10 % OINT Apply 1 application topically daily as needed (rash). Applied to patient's bottom    . lidocaine (LIDODERM) 5 % Place 1 patch onto the skin daily as needed (for pain.). Remove & Discard patch within 12 hours or as directed by MD    . tiZANidine (ZANAFLEX) 4 MG tablet Take 1 tablet by mouth 3 (three) times daily as needed for muscle spasms.   1   No facility-administered medications prior to visit.      Allergies:   Metformin and related   Social History   Social History  . Marital status: Widowed    Spouse name: N/A  . Number of children: N/A  . Years of education: N/A   Social History Main Topics  . Smoking status: Never Smoker  . Smokeless tobacco: Never Used  . Alcohol  use No  . Drug use: No  . Sexual activity: Not Asked   Other Topics Concern  . None   Social History Narrative  . None     Family History:  The patient's family history includes Colitis in her father; Diabetes in her mother; Glaucoma in her mother; Peptic Ulcer in her father.     ROS:   Please see the history of present illness.    ROS All other systems reviewed and are negative.   PHYSICAL EXAM:   VS:  BP (!) 162/80   Pulse 67   Ht 5' (1.524 m)   Wt 131 lb (59.4 kg)   SpO2 97%   BMI 25.58 kg/m    GEN: Well nourished, well developed, in no acute distress, elderly and frail.  HEENT: normal  Neck: no JVD, carotid bruits, or masses Cardiac: RRR; soft SEM, rubs, or gallops, 1-2 + pre tibial edema, L>R Respiratory:  clear to auscultation bilaterally, normal work of breathing GI: soft, nontender, nondistended, + BS MS: no deformity or atrophy  Skin: warm and dry, no rash Neuro:  Alert and Oriented x 3, Strength and sensation are intact Psych: euthymic mood, full affect   Wt Readings from Last 3 Encounters:  11/30/16 131 lb (59.4 kg)  11/15/16 127 lb 13.9 oz (58 kg)  11/11/16 128 lb (58.1 kg)      Studies/Labs Reviewed:   EKG:  EKG is NOT ordered today.    Recent Labs: 08/30/2016: TSH 0.689 09/15/2016: B Natriuretic Peptide 313.0 11/14/2016: ALT 20 11/16/2016: Hemoglobin 11.1; Magnesium 1.5; Platelets 155 11/17/2016: BUN 13; Creatinine, Ser 0.65; Potassium 3.5; Sodium 139   Lipid Panel    Component Value Date/Time   CHOL 114 09/11/2014 0246   TRIG 97 09/11/2014 0246   HDL 40 (L) 09/11/2014 0246   CHOLHDL 2.9 09/11/2014 0246   VLDL 19 09/11/2014 0246  Reader 55 09/11/2014 0246    Additional studies/ records that were reviewed today include:  TAVR OPERATIVE NOTE  Date of Procedure:11/15/2016 Procedure:   Transcatheter Aortic Valve Replacement - Percutaneous RightTransfemoral Approach Edwards Sapien 3 THV (size 35mm,  model # 9600TFX, serial # [id number])  Pre-operative Echo Findings: ? Severe aortic stenosis and moderate aortic insufficiency ? Normalleft ventricular systolic function  Post-operative Echo Findings: ? Mild central AI ? Mild left ventricular systolic dysfunction _____________   2D ECHO: 11/16/2016 Study Conclusions - Left ventricle: The cavity size was normal. There was severe focal basal and moderate concentric hypertrophy. Systolic function was normal. The estimated ejection fraction was in the range of 60% to 65%. Wall motion was normal; there were no regional wall motion abnormalities. Features are consistent with a pseudonormal left ventricular filling pattern, with concomitant abnormal relaxation and increased filling pressure (grade 2 diastolic dysfunction). Doppler parameters are consistent with high ventricular filling pressure. - Aortic valve: A bioprosthesis was present. s/p TAVR with a 54mm Sapien 3 AVR. The mean AVG is 68mmHg and peak AVG is 40mmHg. By PHT there is moderate central AI with PHT 314msec but by coloflow doppler this is not very apparent. The AVA is 1.09cm2. Valve area (VTI): 1.09 cm^2. Valve area (Vmax): 1.02 cm^2. Valve area (Vmean): 1.05 cm^2. Regurgitation pressure half-time: 349 ms. - Mitral valve: Calcified annulus. Moderate diffuse thickening of the anterior leaflet and posterior leaflet. Mild focal calcification of the anterior leaflet, with mild involvement of chords. There was mild regurgitation. - Left atrium: The atrium was severely dilated. - Right ventricle: The cavity size was severely dilated. Wall thickness was normal. - Right atrium: The atrium was moderately dilated. - Atrial septum: The septum bowed from left to right, consistent with increased left atrial pressure. - Tricuspid valve: There was moderate regurgitation. - Pulmonic valve: There was mild regurgitation. - Pulmonary arteries: PA  peak pressure: 66 mm Hg (S). - Pericardium, extracardiac: A small, free-flowing pericardial effusion was identified along the left ventricular free wall. The fluid had no internal echoes. Impressions: - Compared to prior echo, there is now a TAVR present. The right ventricular systolic pressure was increased consistent with moderate pulmonary hypertension.   ASSESSMENT & PLAN:   Severe AS s/p TAVR: doing well after TAVR on 9/18. ECG will be done next week at 1 month visit with echo. HR 67. Groin sites healing well. Continue ASA/plavix  HTN: BP has been high at home and 162/80 today. She has hydralazine 50mg  TID as needed for high BP on her med list but she is not actually taking this. I have asked her to resume this medication and take only if needed given her history of labile HTN  Acute on chronic diastolic CHF: she appears volume overloaded today and she is ~5 lbs over her dry weight of 125lbs on home scale. She takes lasix 20mg  daily and 40mg  when weight above 125mg  daily. I have asked her to take 40mg  daily with extra potassium until she gets down to her dry weight and then go back to her usual dosing. I will check a BMET today and next week .   SVT: continue BB   DMT2: continue home regimen   Medication Adjustments/Labs and Tests Ordered: Current medicines are reviewed at length with the patient today.  Concerns regarding medicines are outlined above.  Medication changes, Labs and Tests ordered today are listed in the Patient Instructions below. Patient Instructions  Medication Instructions:  Your physician has recommended you make  the following change in your medication:  Take furosemide 40 mg by mouth daily until at weight of 125 lbs.  Take potassium with this. Take apresoline 50 mg by mouth three times daily.    Labwork: Lab work to be done today--BMP  Testing/Procedures: none  Follow-Up: Follow up as planned on October 11,2018  Any Other Special Instructions  Will Be Listed Below (If Applicable).     If you need a refill on your cardiac medications before your next appointment, please call your pharmacy.      Signed, Angelena Form, PA-C  11/30/2016 2:18 PM    [facility] [address], [place], [place]  [address] Phone: [phone]; Fax: [phone]

## 2016-11-25 ENCOUNTER — Encounter: Payer: Self-pay | Admitting: Physician Assistant

## 2016-11-30 ENCOUNTER — Ambulatory Visit (INDEPENDENT_AMBULATORY_CARE_PROVIDER_SITE_OTHER): Payer: Medicare HMO | Admitting: Physician Assistant

## 2016-11-30 ENCOUNTER — Encounter: Payer: Self-pay | Admitting: Physician Assistant

## 2016-11-30 VITALS — BP 162/80 | HR 67 | Ht 60.0 in | Wt 131.0 lb

## 2016-11-30 DIAGNOSIS — I471 Supraventricular tachycardia: Secondary | ICD-10-CM

## 2016-11-30 DIAGNOSIS — I10 Essential (primary) hypertension: Secondary | ICD-10-CM | POA: Diagnosis not present

## 2016-11-30 DIAGNOSIS — I5032 Chronic diastolic (congestive) heart failure: Secondary | ICD-10-CM | POA: Diagnosis not present

## 2016-11-30 DIAGNOSIS — E119 Type 2 diabetes mellitus without complications: Secondary | ICD-10-CM

## 2016-11-30 DIAGNOSIS — Z952 Presence of prosthetic heart valve: Secondary | ICD-10-CM | POA: Diagnosis not present

## 2016-11-30 NOTE — Patient Instructions (Addendum)
Medication Instructions:  Your physician has recommended you make the following change in your medication:  Take furosemide 40 mg by mouth daily until at weight of 125 lbs.  Take potassium with this. Take apresoline 50 mg by mouth three times daily.    Labwork: Lab work to be done today--BMP  Testing/Procedures: none  Follow-Up: Follow up as planned on October 11,2018  Any Other Special Instructions Will Be Listed Below (If Applicable).     If you need a refill on your cardiac medications before your next appointment, please call your pharmacy.

## 2016-12-01 ENCOUNTER — Other Ambulatory Visit: Payer: Self-pay | Admitting: *Deleted

## 2016-12-01 LAB — BASIC METABOLIC PANEL
BUN / CREAT RATIO: 26 (ref 12–28)
BUN: 14 mg/dL (ref 10–36)
CO2: 30 mmol/L — ABNORMAL HIGH (ref 20–29)
CREATININE: 0.54 mg/dL — AB (ref 0.57–1.00)
Calcium: 9.5 mg/dL (ref 8.7–10.3)
Chloride: 100 mmol/L (ref 96–106)
GFR, EST AFRICAN AMERICAN: 96 mL/min/{1.73_m2} (ref >59)
GFR, EST NON AFRICAN AMERICAN: 83 mL/min/{1.73_m2} (ref >59)
Glucose: 101 mg/dL — ABNORMAL HIGH (ref 65–99)
Potassium: 4 mmol/L (ref 3.5–5.2)
SODIUM: 141 mmol/L (ref 134–144)

## 2016-12-01 MED ORDER — HYDRALAZINE HCL 50 MG PO TABS: 50.0000 mg | ORAL_TABLET | Freq: Three times a day (TID) | ORAL | 3 refills | Status: DC | PRN

## 2016-12-01 NOTE — Telephone Encounter (Signed)
Please review for refill. Thanks!  

## 2016-12-07 NOTE — Progress Notes (Addendum)
Cardiology Office Note    Date:  12/09/2016   ID:  [name], DOB 11/14/1924, MRN [id number]  PCP:  Tegeler, Loma Sousa, MD  Cardiologist:  Dr. Rockey Situ / Dr. Burt Knack (TAVR)  CC: 1 month follow up s/p TAVR  History of Present Illness:  [name] is a 83 y.o. female with a history of chronic diastolic CHF, SVT, anemia, non obst CAD, pulm HTN, DMT2, HTN and severe AS with mod AI s/p TAVR on 11/15/16 who presents to clinic for 1 month follow up.    2D ECHO 02/2016 showed severe AS with mild-mod AI. AVA 0.94, mean gradient 60 mm Hg and peak gradient 97 mm Hg. She has experienced progressive dyspnea with exertion, NYHA Functional Class 3 symptoms of chronic diastolic heart failure. She had been hospitalized with heart failure exacerbation within the past few months as well. L/RHC in 09/2016 showed non obst CAD with severe R PDA stenosis appropriate for medical therapy, severe pulmonary HTN, and severe AS.  She underwent successful TAVR with a 38mm Edwards Sapien 3 THV via R TF approach on 11/15/16. ECG with new LBBB but no high grade block. Post op course was uncomplicated. She was discharged on ASA and plavix.   I saw her in clinic for follow up on 11/30/16 and she was doing well but appeared slightly volume overloaded. I increased her lasix to 40mg  daily until she got down to her dry weight of 125lbs. Her BP was also elevated and I asked her to resume taking her hydralazine 50mg  TID PRN.   Today she presents to clinic for follow up. No complaints. She is overall feeling much better in terms of her breathing. She mostly just walks around her house and is not very physically active. No CP or SOB. No LE edema, orthopnea or PND. No dizziness or syncope. No blood in stool or urine. No palpitations.     Past Medical History:  Diagnosis Date  . Anemia   . Carotid arterial disease ([place])    a. 08/2014 U/S: Bilateral < 50% stenosis. Patent vertebrals w/ antegrade flow.   .  Cervical spine arthritis   . Chronic diastolic CHF (congestive heart failure) ([place])    a. echo 2014: EF 55-60%, DD;  b. 08/2014 Echo: EF 55-60%, no RWMA, GR1DD; c. 02/2016 Echo: EF 65-70%, Gr1 DD.  Marland Kitchen Cognitive impairment   . Depression   . Diabetes mellitus without complication ([place])   . Essential hypertension   . GERD (gastroesophageal reflux disease)   . Glaucoma   . Gout   . History of renal impairment   . Hyperlipidemia   . Hypotension    a. Related to Norvasc - 11/2014 ED visit.  . Multinodular goiter    a. Noted incidentally on CT 07/2012 and carotid U/S 08/2014;  b. Nl TSH 11/2014.  Marland Kitchen Paroxysmal SVT (supraventricular tachycardia) ([facility])    a. on Toprol   . Prolapsed uterus   . Severe aortic stenosis    a. 11/15/16: s/p TAVR Edwards Sapien 3 THV (size 23 mm, model # 9600TFX, serial # [id number])  . Stroke [facility])    a. CT head 07/2014 showed small thalamic infarct  . Vitamin deficiency     Past Surgical History:  Procedure Laterality Date  . EYE SURGERY     unsure of what procedure, did know that laser was used  . IR RADIOLOGY PERIPHERAL GUIDED IV START  11/03/2016  . IR US GUIDE VASC ACCESS RIGHT  11/03/2016  . RIGHT/LEFT  HEART CATH AND CORONARY ANGIOGRAPHY N/A 10/26/2016   Procedure: RIGHT/LEFT HEART CATH AND CORONARY ANGIOGRAPHY;  Surgeon: [name], MD;  Location: [place] [place] CV [facility];  Service: Cardiovascular;  Laterality: N/A;  . TEE WITHOUT CARDIOVERSION N/A 03/31/2016   Procedure: TRANSESOPHAGEAL ECHOCARDIOGRAM (TEE);  Surgeon: Minna Merritts, MD;  Location: [facility] ORS;  Service: Cardiovascular;  Laterality: N/A;  . TEE WITHOUT CARDIOVERSION N/A 11/15/2016   Procedure: TRANSESOPHAGEAL ECHOCARDIOGRAM (TEE);  Surgeon: [name], MD;  Location: [place];  Service: Open Heart Surgery;  Laterality: N/A;  . TRANSCATHETER AORTIC VALVE REPLACEMENT, TRANSFEMORAL N/A 11/15/2016   Procedure: TRANSCATHETER AORTIC VALVE REPLACEMENT, TRANSFEMORAL;  Surgeon: [name], MD;  Location: [place];  Service: Open Heart Surgery;  Laterality: N/A;  . TUBAL LIGATION      Current Medications: Outpatient Medications Prior to Visit  Medication Sig Dispense Refill  . acetaminophen (TYLENOL) 500 MG tablet Take 500 mg by mouth every 6 (six) hours as needed for mild pain or headache.    . allopurinol (ZYLOPRIM) 100 MG tablet Take 100 mg by mouth daily.    . Amino Acids-Protein Hydrolys (FEEDING SUPPLEMENT, PRO-STAT SUGAR FREE 64,) LIQD Take 30 mLs by mouth 3 (three) times daily with meals.    Marland Kitchen aspirin 81 MG chewable tablet Chew 81 mg by mouth daily.    . brimonidine (ALPHAGAN P) 0.1 % SOLN Place 1 drop into the right eye 2 (two) times daily.    . cholecalciferol (VITAMIN D) 1000 units tablet Take 1,000 Units by mouth daily.    . clopidogrel (PLAVIX) 75 MG tablet Take 1 tablet (75 mg total) by mouth daily with breakfast. 90 tablet 1  . Dorzolamide HCl-Timolol Mal (COSOPT OP) Place 1 drop into the right eye 2 (two) times daily.     . feeding supplement, ENSURE ENLIVE, (ENSURE ENLIVE) LIQD Take 237 mLs by mouth 2 (two) times daily between meals. 237 mL 12  . gabapentin (NEURONTIN) 300 MG capsule Take 300 mg by mouth 2 (two) times daily.    Marland Kitchen HYDROcodone-acetaminophen (NORCO/VICODIN) 5-325 MG tablet Take 1 tablet by mouth every 6 (six) hours as needed for moderate pain. 30 tablet 0  . iron polysaccharides (NIFEREX) 150 MG capsule Take 150 mg by mouth 2 (two) times daily.    . isosorbide mononitrate (IMDUR) 30 MG 24 hr tablet Take 1 tablet (30 mg total) by mouth daily. 30 tablet 0  . latanoprost (XALATAN) 0.005 % ophthalmic solution Place 1 drop into the right eye at bedtime.  3  . lisinopril (PRINIVIL,ZESTRIL) 10 MG tablet Take 10 mg by mouth daily. HOLD FOR BLOOD PRESSURE LOWER THAN 90/60    . LORazepam (ATIVAN) 0.5 MG tablet Take 1 tablet (0.5 mg total) by mouth at bedtime. Take 1 tablet at bedtime as needed for sleep or anxiety (Patient taking differently: Take 0.5-1 mg by mouth See admin  instructions. Take 1 mg by mouth at bedtime and may take and additional  0.5mg  during the day as needed for anxiety) 60 tablet 0  . lovastatin (MEVACOR) 40 MG tablet Take 40 mg by mouth at bedtime.    . metoprolol succinate (TOPROL-XL) 25 MG 24 hr tablet Take 25 mg by mouth daily.    . Multiple Vitamin (MULTI-VITAMIN PO) Take 1 tablet by mouth daily.    . pantoprazole (PROTONIX) 40 MG tablet Take 40 mg by mouth daily.    . pilocarpine (PILOCAR) 4 % ophthalmic solution Place 1 drop into the right eye 4 (four) times daily.     Marland Kitchen  potassium chloride (K-DUR) 10 MEQ tablet Take 1 tablet (10 mEq total) by mouth daily. Take with lasix (Patient taking differently: Take 10 mEq by mouth daily as needed (for fluid retention (takes with furosemide)). ) 30 tablet 6  . senna-docusate (SENOKOT-S) 8.6-50 MG tablet Take 2 tablets by mouth daily as needed for mild constipation.     . sertraline (ZOLOFT) 50 MG tablet Take 50 mg by mouth daily.    . traZODone (DESYREL) 100 MG tablet Take 100 mg by mouth at bedtime.    . vitamin C (ASCORBIC ACID) 500 MG tablet Take 500 mg by mouth daily.    . Zinc Oxide 10 % OINT Apply 1 application topically daily as needed (rash). Applied to patient's bottom    . furosemide (LASIX) 20 MG tablet Take 20-40 mg by mouth daily as needed (for fluid retention (takes with potassium)). At weight of 125lbs or greater patient takes 40 mg    . hydrALAZINE (APRESOLINE) 50 MG tablet Take 1 tablet (50 mg total) by mouth 3 (three) times daily as needed (elevated BP). 270 tablet 3   No facility-administered medications prior to visit.      Allergies:   Metformin and related   Social History   Social History  . Marital status: Widowed    Spouse name: N/A  . Number of children: N/A  . Years of education: N/A   Social History Main Topics  . Smoking status: Never Smoker  . Smokeless tobacco: Never Used  . Alcohol use No  . Drug use: No  . Sexual activity: Not on file   Other Topics  Concern  . Not on file   Social History Narrative  . No narrative on file     Family History:  The patient's family history includes Colitis in her father; Diabetes in her mother; Glaucoma in her mother; Peptic Ulcer in her father.      ROS:   Please see the history of present illness.    ROS All other systems reviewed and are negative.   PHYSICAL EXAM:   VS:  BP (!) 164/84   Pulse 71   Ht 5' (1.524 m)   Wt 123 lb 12.8 oz (56.2 kg)   BMI 24.18 kg/m    GEN: Well nourished, well developed, in no acute distress, elderly and frail appearing.  HEENT: normal  Neck: no JVD, carotid bruits, or masses Cardiac: RRR; soft SEM. No rubs, or gallops,no edema  Respiratory:  clear to auscultation bilaterally, normal work of breathing GI: soft, nontender, nondistended, + BS MS: no deformity or atrophy  Skin: warm and dry, no rash Neuro:  Alert and Oriented x 3, Strength and sensation are intact Psych: euthymic mood, full affect   Wt Readings from Last 3 Encounters:  12/08/16 123 lb 12.8 oz (56.2 kg)  11/30/16 131 lb (59.4 kg)  11/15/16 127 lb 13.9 oz (58 kg)      Studies/Labs Reviewed:   EKG:  EKG is ordered today.  The ekg ordered today demonstrates sinus with PVCs, LBBB, LAD HR 71  Recent Labs: 08/30/2016: TSH 0.689 09/15/2016: B Natriuretic Peptide 313.0 11/14/2016: ALT 20 11/16/2016: Hemoglobin 11.1; Magnesium 1.5; Platelets 155 12/08/2016: BUN 21; Creatinine, Ser 0.79; Potassium 4.4; Sodium 140   Lipid Panel    Component Value Date/Time   CHOL 114 09/11/2014 0246   TRIG 97 09/11/2014 0246   HDL 40 (L) 09/11/2014 0246   CHOLHDL 2.9 09/11/2014 0246   VLDL 19 09/11/2014 0246   LDLCALC  55 09/11/2014 0246    Additional studies/ records that were reviewed today include:  TAVR OPERATIVE NOTE  Date of Procedure:11/15/2016 Procedure:   Transcatheter Aortic Valve Replacement - Percutaneous RightTransfemoral Approach Edwards Sapien 3 THV  (size 80mm, model # 9600TFX, serial # [id number])  Pre-operative Echo Findings: ? Severe aortic stenosis and moderate aortic insufficiency ? Normalleft ventricular systolic function  Post-operative Echo Findings: ? Mild central AI ? Mild left ventricular systolic dysfunction _____________   2D ECHO: 11/16/2016 Study Conclusions - Left ventricle: The cavity size was normal. There was severe focal basal and moderate concentric hypertrophy. Systolic function was normal. The estimated ejection fraction was in the range of 60% to 65%. Wall motion was normal; there were no regional wall motion abnormalities. Features are consistent with a pseudonormal left ventricular filling pattern, with concomitant abnormal relaxation and increased filling pressure (grade 2 diastolic dysfunction). Doppler parameters are consistent with high ventricular filling pressure. - Aortic valve: A bioprosthesis was present. s/p TAVR with a 8mm Sapien 3 AVR. The mean AVG is 62mmHg and peak AVG is 49mmHg. By PHT there is moderate central AI with PHT 385msec but by coloflow doppler this is not very apparent. The AVA is 1.09cm2. Valve area (VTI): 1.09 cm^2. Valve area (Vmax): 1.02 cm^2. Valve area (Vmean): 1.05 cm^2. Regurgitation pressure half-time: 349 ms. - Mitral valve: Calcified annulus. Moderate diffuse thickening of the anterior leaflet and posterior leaflet. Mild focal calcification of the anterior leaflet, with mild involvement of chords. There was mild regurgitation. - Left atrium: The atrium was severely dilated. - Right ventricle: The cavity size was severely dilated. Wall thickness was normal. - Right atrium: The atrium was moderately dilated. - Atrial septum: The septum bowed from left to right, consistent with increased left atrial pressure. - Tricuspid valve: There was moderate regurgitation. - Pulmonic valve: There was mild regurgitation. - Pulmonary  arteries: PA peak pressure: 66 mm Hg (S). - Pericardium, extracardiac: A small, free-flowing pericardial effusion was identified along the left ventricular free wall. The fluid had no internal echoes. Impressions: - Compared to prior echo, there is now a TAVR present. The right ventricular systolic pressure was increased consistent with moderate pulmonary hypertension.  2D ECHO 12/08/16:  Study Conclusions  - Left ventricle: The cavity size was normal. There was mild   concentric hypertrophy. Systolic function was mildly to   moderately reduced. The estimated ejection fraction was in the   range of 40% to 45%. Diffuse hypokinesis. Doppler parameters are   consistent with abnormal left ventricular relaxation (grade 1   diastolic dysfunction). - Ventricular septum: Septal motion showed abnormal function,   dyssynergy, and paradox. These changes are consistent with a left   bundle branch block. - Aortic valve: A stent-valve (TAVR) bioprosthesis was present and   functioning normally. There was mild regurgitation. It was not   possible to determine whether the regurgitation was valvular or   perivalvular, but central valvular AI appears more likely. - Mitral valve: Calcified annulus. There was mild to moderate   regurgitation directed centrally. - Left atrium: The atrium was moderately dilated. - Atrial septum: The septum bowed from left to right, consistent   with increased left atrial pressure. There was an atrial septal   aneurysm. - Pulmonary arteries: Systolic pressure was mildly increased. PA   peak pressure: 42 mm Hg (S). - Pericardium, extracardiac: A trivial pericardial effusion was   identified. Impressions: - LV systolic function has deteriorated. This appears to be in   large part  du to systolic dyssynchrony (RV pacing or new LBBB).   ASSESSMENT & PLAN:   Severe AS s/p TAVR: she has NHYA class I symptoms but she is not very active. 1 month echo today shows  normal TAVR valve function with mild AI and mild LV dysfunction related to LBBB. She does not have any native teeth but we discussed SBE prophylaxis. Continue ASA/plavix. Plavix may be discontinued after 6 months of therapy. She will be seen back in 1 year with an echocardiogram.   HTN: BP still elevated. We had resumed hydralazine 50mg  TID last visit, but her daughter felt like it dropped her BP too much. I have changed it to 25mg  TID.   Chronic combined S/D CHF: she appears euvolemic today and now back to her dry weight. Will check a BMET today given extra lasix last week.  ECHO today shows new LV dysfunction related to LBBB. Continue Toprol XL, Lisinopril and hydralazine/nitrates.   SVT: continue BB   DMT2: continue current regimen    Medication Adjustments/Labs and Tests Ordered: Current medicines are reviewed at length with the patient today.  Concerns regarding medicines are outlined above.  Medication changes, Labs and Tests ordered today are listed in the Patient Instructions below. Patient Instructions  Medication Instructions:  1. DECREASE HYDRALAZINE 25 MG THREE TIMES A DAY; THOUGH YOU WILL HOLD IF SYSTOLIC BLOOD PRESSURE (TOP NUMBER) IS 100 OR BELOW  Labwork: BMET TODAY  Testing/Procedures: ECHO TO BE DONE IN 1 YEAR SAME DAY AS FOLLOW UP APPT WITH KATIE Kingsten [place], PAC   Follow-Up: Your physician wants you to follow-up in: [place], [place] SAME DAY AS ECHO.  You will receive a reminder letter in the mail two months in advance. If you don't receive a letter, please call our office to schedule the follow-up appointment.   Any Other Special Instructions Will Be Listed Below (If Applicable).     If you need a refill on your cardiac medications before your next appointment, please call your pharmacy.      Signed, Angelena Form, PA-C  12/09/2016 10:27 AM    [facility] [address], [place], [place]  [id number] Phone: [phone];  Fax: [phone]

## 2016-12-08 ENCOUNTER — Ambulatory Visit (INDEPENDENT_AMBULATORY_CARE_PROVIDER_SITE_OTHER): Payer: Medicare HMO | Admitting: Physician Assistant

## 2016-12-08 ENCOUNTER — Other Ambulatory Visit: Payer: Self-pay | Admitting: Physician Assistant

## 2016-12-08 ENCOUNTER — Other Ambulatory Visit: Payer: Self-pay

## 2016-12-08 ENCOUNTER — Ambulatory Visit (HOSPITAL_COMMUNITY): Payer: Medicare HMO | Attending: Cardiovascular Disease

## 2016-12-08 VITALS — BP 164/84 | HR 71 | Ht 60.0 in | Wt 123.8 lb

## 2016-12-08 DIAGNOSIS — I471 Supraventricular tachycardia: Secondary | ICD-10-CM

## 2016-12-08 DIAGNOSIS — I5032 Chronic diastolic (congestive) heart failure: Secondary | ICD-10-CM

## 2016-12-08 DIAGNOSIS — I350 Nonrheumatic aortic (valve) stenosis: Secondary | ICD-10-CM | POA: Insufficient documentation

## 2016-12-08 DIAGNOSIS — I110 Hypertensive heart disease with heart failure: Secondary | ICD-10-CM | POA: Diagnosis not present

## 2016-12-08 DIAGNOSIS — E119 Type 2 diabetes mellitus without complications: Secondary | ICD-10-CM | POA: Diagnosis not present

## 2016-12-08 DIAGNOSIS — Z952 Presence of prosthetic heart valve: Secondary | ICD-10-CM | POA: Diagnosis not present

## 2016-12-08 DIAGNOSIS — I509 Heart failure, unspecified: Secondary | ICD-10-CM | POA: Diagnosis not present

## 2016-12-08 DIAGNOSIS — I10 Essential (primary) hypertension: Secondary | ICD-10-CM

## 2016-12-08 DIAGNOSIS — I253 Aneurysm of heart: Secondary | ICD-10-CM | POA: Diagnosis not present

## 2016-12-08 DIAGNOSIS — E785 Hyperlipidemia, unspecified: Secondary | ICD-10-CM | POA: Diagnosis not present

## 2016-12-08 MED ORDER — HYDRALAZINE HCL 25 MG PO TABS: 25.0000 mg | ORAL_TABLET | Freq: Three times a day (TID) | ORAL | 3 refills | Status: DC

## 2016-12-08 MED ORDER — FUROSEMIDE 20 MG PO TABS: 20.0000 mg | ORAL_TABLET | Freq: Every day | ORAL | 4 refills | Status: DC

## 2016-12-08 NOTE — Patient Instructions (Signed)
Medication Instructions:  1. DECREASE HYDRALAZINE 25 MG THREE TIMES A DAY; THOUGH YOU WILL HOLD IF SYSTOLIC BLOOD PRESSURE (TOP NUMBER) IS 100 OR BELOW  Labwork: BMET TODAY  Testing/Procedures: ECHO TO BE DONE IN 1 YEAR SAME DAY AS FOLLOW UP APPT WITH KATIE THOMPSON, PAC   Follow-Up: Your physician wants you to follow-up in: [place], [place] SAME DAY AS ECHO.  You will receive a reminder letter in the mail two months in advance. If you don't receive a letter, please call our office to schedule the follow-up appointment.   Any Other Special Instructions Will Be Listed Below (If Applicable).     If you need a refill on your cardiac medications before your next appointment, please call your pharmacy.

## 2016-12-09 ENCOUNTER — Telehealth: Payer: Self-pay | Admitting: *Deleted

## 2016-12-09 LAB — BASIC METABOLIC PANEL
BUN/Creatinine Ratio: 27 (ref 12–28)
BUN: 21 mg/dL (ref 10–36)
CALCIUM: 9.8 mg/dL (ref 8.7–10.3)
CHLORIDE: 100 mmol/L (ref 96–106)
CO2: 26 mmol/L (ref 20–29)
Creatinine, Ser: 0.79 mg/dL (ref 0.57–1.00)
GFR calc Af Amer: 76 mL/min/{1.73_m2} (ref >59)
GFR calc non Af Amer: 66 mL/min/{1.73_m2} (ref >59)
Glucose: 119 mg/dL — ABNORMAL HIGH (ref 65–99)
POTASSIUM: 4.4 mmol/L (ref 3.5–5.2)
Sodium: 140 mmol/L (ref 134–144)

## 2016-12-09 NOTE — Telephone Encounter (Signed)
-----   Message from Eileen Stanford, PA-C sent at 12/09/2016  8:05 AM EDT ----- Labs look great

## 2016-12-09 NOTE — Telephone Encounter (Signed)
DPR ok to s/w daughter [name]. [name] has been notified of lab results by phone for the pt with verbal understanding. [name] thanked me for my call.

## 2016-12-13 ENCOUNTER — Encounter: Payer: Self-pay | Admitting: Thoracic Surgery (Cardiothoracic Vascular Surgery)

## 2016-12-26 ENCOUNTER — Ambulatory Visit (INDEPENDENT_AMBULATORY_CARE_PROVIDER_SITE_OTHER): Payer: Medicare HMO | Admitting: Podiatry

## 2016-12-26 DIAGNOSIS — M79676 Pain in unspecified toe(s): Secondary | ICD-10-CM

## 2016-12-26 DIAGNOSIS — D689 Coagulation defect, unspecified: Secondary | ICD-10-CM

## 2016-12-26 DIAGNOSIS — M2012 Hallux valgus (acquired), left foot: Secondary | ICD-10-CM

## 2016-12-26 DIAGNOSIS — B351 Tinea unguium: Secondary | ICD-10-CM | POA: Diagnosis not present

## 2016-12-26 DIAGNOSIS — E1159 Type 2 diabetes mellitus with other circulatory complications: Secondary | ICD-10-CM

## 2016-12-26 DIAGNOSIS — M2011 Hallux valgus (acquired), right foot: Secondary | ICD-10-CM

## 2016-12-26 NOTE — Progress Notes (Signed)
Complaint:  Visit Type: Patient returns to my office for continued preventative foot care services. Complaint: Patient states" my nails have grown long and thick and become painful to walk and wear shoes" Patient has been diagnosed with DM with no foot complications. The patient presents for preventative foot care services. No changes to ROS.  Patient is taking plavix.  Podiatric Exam: Vascular: dorsalis pedis  pulses are weakly palpable bilateral. PT pulses are not palpable  B/L. Capillary return is immediate. Temperature gradient is WNL. Skin turgor WNL  Sensorium: Normal Semmes Weinstein monofilament test. Normal tactile sensation bilaterally. Nail Exam: Pt has thick disfigured discolored nails with subungual debris noted bilateral entire nail hallux through fifth toenails Ulcer Exam: There is no evidence of ulcer or pre-ulcerative changes or infection. Orthopedic Exam: Muscle tone and strength are WNL. No limitations in general ROM. No crepitus or effusions noted. Foot type and digits show no abnormalities. HAV  B/L. Skin: No Porokeratosis. No infection or ulcers  Diagnosis:  Onychomycosis, , Pain in right toe, pain in left toes,  Diabetes with vascular disease. HAV  B/L.  Treatment & Plan Procedures and Treatment: Consent by patient was obtained for treatment procedures.   Debridement of mycotic and hypertrophic toenails, 1 through 5 bilateral and clearing of subungual debris. No ulceration, no infection noted. Patient is scheduled to return to the office on Thursday to be seen by and see for diabetic footgear Return Visit-Office Procedure: Patient instructed to return to the office for a follow up visit 3 months for continued evaluation and treatment.    Gardiner Barefoot DPM

## 2016-12-28 ENCOUNTER — Other Ambulatory Visit: Payer: Self-pay | Admitting: Cardiovascular Disease

## 2017-01-02 DIAGNOSIS — F1320 Sedative, hypnotic or anxiolytic dependence, uncomplicated: Secondary | ICD-10-CM | POA: Insufficient documentation

## 2017-01-02 DIAGNOSIS — G4709 Other insomnia: Secondary | ICD-10-CM | POA: Insufficient documentation

## 2017-01-08 NOTE — Progress Notes (Signed)
Cardiology Office Note  Date:  01/09/2017   ID:  [name], DOB [birth year], MRN [id number]  PCP:  Tegeler, Loma Sousa, MD   Chief Complaint  Patient presents with  . other    2- 3 month follow up. Patient denies chest pain and SOB. Meds reviewed vebrally with patient.     HPI:  [name] is a pleasant 83 year old woman with a history of  diabetes,  hypertension,  SVT, Moderate aortic valve stenosis  hospital admission July 2016 for SVT after she developed numerous bug bites with associated pain. 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  She had TAVR September 2018 Hospital records reviewed with the patient in detail Seems to have recovered well, daughter presents with her today Patient still lives independently 1 recent fall when visiting her daughter, fell in the bathroom She has med alert button at home  Denies any shortness of breath, chest pain, leg swelling Seems to have labile blood pressure  No recent trips to the emergency room  Other past medical history reviewed chronic back pain, labile hypertension,   Appetite continues to be poor Albumin running low  Denies any significant chest pain concerning for angina. Denies having any tachycardia concerning for arrhythmia  Other past medical history reviewed Previous hospitalization end of January with discharge 04/01/2016 She had sepsis, enterococcus , had TEE done in the hospital to rule out endocarditis Moderate aortic valve stenosis noted  emergency room 04/12/16  fatigue, shortness breath Felt it was from anemia, deconditioning. Was not admitted to the hospital  Family chose to bring her home, did not go to skilled nursing facility  Daughter concerned as blood pressures running low She has been holding doses of hydralazine, still running low HCTZ previously held for renal  dysfunction  hospital admission July 2016,She reports that she had a rash at the time. Turns out she went walking in her yard, developed insect bites all of her legs and abdomen (chiger bits). She was in a tremendous amount of discomfort with severe itching. Likely secondary to that, she developed arrhythmia. she converted back to normal sinus rhythm in the hospital. At the time of discharge her metoprolol was increased from 50 up to 75 mg daily. She is felt well with no further episodes of tachycardia. She currently lives at home, independent  admitted to the hospital 09/20/2012 discharged on 09/25/2012 for Klebsiella UTI, shortness breath, tachycardia, SVT. Arrival to the hospital, she was very weak and was unable to swallow. She was felt to be very dehydrated. Her potassium was repleted, she was started on metoprolol and Cardizem.  Echocardiogram in the hospital 09/19/2012 showed ejection fraction 65-68%, diastolic dysfunction, mild LVH, moderate aortic valve stenosis, normal RV function and size  CT scan of the neck showed enlargement of the thyroid with multinodular appearance, atherosclerotic disease, right pleural effusion Lab work in the hospital 09/11/2012 showing creatinine 1.57, BUN 44 Followup blood work showed creatinine 1.07, BUN 17 on July 23  PMH:   has a past medical history of Anemia, Carotid arterial disease (HCC), Cervical spine arthritis, Chronic diastolic CHF (congestive heart failure) ([place]), Cognitive impairment, Depression, Diabetes mellitus without complication ([place]), Essential hypertension, GERD (gastroesophageal reflux disease), Glaucoma, Gout, History of renal impairment, Hyperlipidemia, Hypotension, Multinodular goiter, Paroxysmal SVT (supraventricular tachycardia) ([place]), Prolapsed uterus, Severe aortic stenosis, Stroke ([place]), and Vitamin deficiency.  PSH:    Past Surgical History:  Procedure Laterality Date  . EYE SURGERY  unsure of what procedure, did know  that laser was used  . IR RADIOLOGY PERIPHERAL GUIDED IV START  11/03/2016  . IR US GUIDE VASC ACCESS RIGHT  11/03/2016  . TUBAL LIGATION      Current Outpatient Medications  Medication Sig Dispense Refill  . ACCU-CHEK AVIVA PLUS test strip USE 1 STRIP TO TEST BLOOD SUGARS 3 TIMES A DAY  12  . acetaminophen (TYLENOL) 500 MG tablet Take 500 mg by mouth every 6 (six) hours as needed for mild pain or headache.    . allopurinol (ZYLOPRIM) 100 MG tablet Take 100 mg by mouth daily.    . Amino Acids-Protein Hydrolys (FEEDING SUPPLEMENT, PRO-STAT SUGAR FREE 64,) LIQD Take 30 mLs by mouth 3 (three) times daily with meals.    Marland Kitchen aspirin 81 MG chewable tablet Chew 81 mg by mouth daily.    . brimonidine (ALPHAGAN P) 0.1 % SOLN Place 1 drop into the right eye 2 (two) times daily.    . cholecalciferol (VITAMIN D) 1000 units tablet Take 1,000 Units by mouth daily.    . clopidogrel (PLAVIX) 75 MG tablet Take 1 tablet (75 mg total) by mouth daily with breakfast. 90 tablet 1  . Dorzolamide HCl-Timolol Mal (COSOPT OP) Place 1 drop into the right eye 2 (two) times daily.     . feeding supplement, ENSURE ENLIVE, (ENSURE ENLIVE) LIQD Take 237 mLs by mouth 2 (two) times daily between meals. 237 mL 12  . furosemide (LASIX) 20 MG tablet Take 1 tablet (20 mg total) by mouth daily. Take an additional 20mg  if weight >125lbs 60 tablet 4  . gabapentin (NEURONTIN) 300 MG capsule Take 300 mg by mouth 2 (two) times daily.    . hydrALAZINE (APRESOLINE) 25 MG tablet Take 1 tablet (25 mg total) by mouth 3 (three) times daily. Hold only if systolic blood pressure (top number) is 100 or below 90 tablet 3  . HYDROcodone-acetaminophen (NORCO/VICODIN) 5-325 MG tablet Take 1 tablet by mouth every 6 (six) hours as needed for moderate pain. 30 tablet 0  . iron polysaccharides (NIFEREX) 150 MG capsule Take 150 mg by mouth 2 (two) times daily.    . isosorbide mononitrate (IMDUR) 30 MG 24 hr tablet Take 1 tablet (30 mg total) by mouth daily. 30  tablet 0  . latanoprost (XALATAN) 0.005 % ophthalmic solution Place 1 drop into the right eye at bedtime.  3  . lidocaine (LIDODERM) 5 % Place onto the skin.    Marland Kitchen lisinopril (PRINIVIL,ZESTRIL) 10 MG tablet Take 10 mg by mouth daily. HOLD FOR BLOOD PRESSURE LOWER THAN 90/60    . LORazepam (ATIVAN) 0.5 MG tablet Take 1 tablet (0.5 mg total) by mouth at bedtime. Take 1 tablet at bedtime as needed for sleep or anxiety (Patient taking differently: Take 0.5-1 mg by mouth See admin instructions. Take 1 mg by mouth at bedtime and may take and additional  0.5mg  during the day as needed for anxiety) 60 tablet 0  . lovastatin (MEVACOR) 40 MG tablet Take 40 mg by mouth at bedtime.    . metoprolol succinate (TOPROL-XL) 25 MG 24 hr tablet Take 25 mg by mouth daily.    . Multiple Vitamin (MULTI-VITAMIN PO) Take 1 tablet by mouth daily.    . pantoprazole (PROTONIX) 40 MG tablet Take 40 mg by mouth daily.    . pilocarpine (PILOCAR) 4 % ophthalmic solution Place 1 drop into the right eye 4 (four) times daily.     . potassium chloride (K-DUR)  10 MEQ tablet Take 1 tablet (10 mEq total) by mouth daily. Take with lasix (Patient taking differently: Take 10 mEq by mouth daily as needed (for fluid retention (takes with furosemide)). ) 30 tablet 6  . senna-docusate (SENOKOT-S) 8.6-50 MG tablet Take 2 tablets by mouth daily as needed for mild constipation.     . sertraline (ZOLOFT) 50 MG tablet Take 50 mg by mouth daily.    . traZODone (DESYREL) 100 MG tablet Take 100 mg by mouth at bedtime.    . vitamin C (ASCORBIC ACID) 500 MG tablet Take 500 mg by mouth daily.    . Zinc Oxide 10 % OINT Apply 1 application topically daily as needed (rash). Applied to patient's bottom     No current facility-administered medications for this visit.      Allergies:   Metformin and Metformin and related   Social History:  The patient  reports that  has never smoked. she has never used smokeless tobacco. She reports that she does not  drink alcohol or use drugs.   Family History:   family history includes Colitis in her father; Diabetes in her mother; Glaucoma in her mother; Peptic Ulcer in her father.    Review of Systems: Review of Systems  Constitutional:       General malaise  Respiratory: Negative.   Cardiovascular: Negative.   Gastrointestinal: Negative.   Musculoskeletal: Negative.        Leg weakness  Neurological: Positive for weakness.  Psychiatric/Behavioral: Negative.   All other systems reviewed and are negative.    PHYSICAL EXAM: VS:  BP (!) 110/58 (BP Location: Right Arm, Patient Position: Sitting, Cuff Size: Normal)   Pulse (!) 59   Ht 5' (1.524 m)   Wt 127 lb (57.6 kg)   BMI 24.80 kg/m  , BMI Body mass index is 24.8 kg/m. GEN: Thin, sleepy, presents in a wheelchair, appears weak,  conversant HEENT: normal  Neck: no JVD, carotid bruits, or masses Cardiac: RRR; no murmurs, rubs, or gallops,no edema  Respiratory:  clear to auscultation bilaterally, normal work of breathing GI: soft, nontender, nondistended, + BS MS: no deformity or atrophy  Skin: warm and dry, no rash Neuro:  Strength and sensation are intact Psych: euthymic mood, full affect    Recent Labs: 08/30/2016: TSH 0.689 09/15/2016: B Natriuretic Peptide 313.0 11/14/2016: ALT 20 11/16/2016: Hemoglobin 11.1; Magnesium 1.5; Platelets 155 12/08/2016: BUN 21; Creatinine, Ser 0.79; Potassium 4.4; Sodium 140    Lipid Panel Lab Results  Component Value Date   CHOL 114 09/11/2014   HDL 40 (L) 09/11/2014   LDLCALC 55 09/11/2014   TRIG 97 09/11/2014      Wt Readings from Last 3 Encounters:  01/09/17 127 lb (57.6 kg)  12/08/16 123 lb 12.8 oz (56.2 kg)  11/30/16 131 lb (59.4 kg)       ASSESSMENT AND PLAN:  SVT (supraventricular tachycardia) (HCC) Denies having any significant arrhythmia No changes to her medications,   Essential hypertension, malignant Blood pressure is well controlled on today's visit. No changes  made to the medications. Daughter gives extra hydralazine for high pressure  Chronic diastolic CHF (congestive heart failure) (HCC) Appears euvolemic, likely improved after recent aortic valve surgery  Aortic valve stenosis, s/p TAVR  Doing well, recovered  SOB (shortness of breath) Denies any symptoms of shortness of breath No further workup at this time  Diabetes mellitus without complication ([place]) Management per primary care Losing weight, low albumin level  Acute midline low back  pain, with sciatica presence unspecified Chronic back pain  Disposition:   F/U  6 months   Total encounter time more than 25 minutes  Greater than 50% was spent in counseling and coordination of care with the patient   No orders of the defined types were placed in this encounter.    Signed, Esmond Plants, M.D., Ph.D. 01/09/2017  [place], [place]

## 2017-01-09 ENCOUNTER — Encounter: Payer: Self-pay | Admitting: Cardiovascular Disease

## 2017-01-09 ENCOUNTER — Ambulatory Visit: Payer: Medicare HMO | Admitting: Cardiovascular Disease

## 2017-01-09 VITALS — BP 110/58 | HR 59 | Ht 60.0 in | Wt 127.0 lb

## 2017-01-09 DIAGNOSIS — I10 Essential (primary) hypertension: Secondary | ICD-10-CM | POA: Diagnosis not present

## 2017-01-09 DIAGNOSIS — I6523 Occlusion and stenosis of bilateral carotid arteries: Secondary | ICD-10-CM

## 2017-01-09 DIAGNOSIS — I471 Supraventricular tachycardia: Secondary | ICD-10-CM

## 2017-01-09 DIAGNOSIS — E7800 Pure hypercholesterolemia, unspecified: Secondary | ICD-10-CM | POA: Diagnosis not present

## 2017-01-09 DIAGNOSIS — I350 Nonrheumatic aortic (valve) stenosis: Secondary | ICD-10-CM

## 2017-01-09 DIAGNOSIS — I5032 Chronic diastolic (congestive) heart failure: Secondary | ICD-10-CM | POA: Diagnosis not present

## 2017-01-09 NOTE — Patient Instructions (Signed)
Medication Instructions:   Stop the plavix at the end of March Stay on aspirin  Labwork:  No new labs needed  Testing/Procedures:  No further testing at this time   Follow-Up: It was a pleasure seeing you in the office today. Please call us if you have new issues that need to be addressed before your next appt.  [phone]  Your physician wants you to follow-up in: 6 months.  You will receive a reminder letter in the mail two months in advance. If you don't receive a letter, please call our office to schedule the follow-up appointment.  If you need a refill on your cardiac medications before your next appointment, please call your pharmacy.

## 2017-01-23 ENCOUNTER — Ambulatory Visit: Payer: Medicare HMO | Admitting: Podiatry

## 2017-01-26 ENCOUNTER — Ambulatory Visit (INDEPENDENT_AMBULATORY_CARE_PROVIDER_SITE_OTHER): Payer: Medicare HMO | Admitting: Podiatry

## 2017-01-26 ENCOUNTER — Encounter: Payer: Self-pay | Admitting: Podiatry

## 2017-01-26 DIAGNOSIS — E1142 Type 2 diabetes mellitus with diabetic polyneuropathy: Secondary | ICD-10-CM | POA: Diagnosis not present

## 2017-01-26 DIAGNOSIS — E1159 Type 2 diabetes mellitus with other circulatory complications: Secondary | ICD-10-CM

## 2017-01-26 DIAGNOSIS — M2011 Hallux valgus (acquired), right foot: Secondary | ICD-10-CM

## 2017-01-26 DIAGNOSIS — M2012 Hallux valgus (acquired), left foot: Secondary | ICD-10-CM | POA: Diagnosis not present

## 2017-01-26 NOTE — Progress Notes (Signed)
Complaint:  Visit Type: Patient returns to my office for continued evaluation of her diabetic feet.  She presents the office today for an evaluation of her foot and she requests a diabetic footwear.  Patient is taking plavix.  Podiatric Exam: Vascular: dorsalis pedis  pulses are weakly palpable bilateral. PT pulses are not palpable  B/L. Capillary return is immediate. Temperature gradient is WNL. Skin turgor WNL  Sensorium: Diminished  Semmes Weinstein monofilament test. Normal tactile sensation bilaterally. Nail Exam: Pt has thick disfigured discolored nails with subungual debris noted bilateral entire nail hallux through fifth toenails Ulcer Exam: There is no evidence of ulcer or pre-ulcerative changes or infection. Orthopedic Exam: Muscle tone and strength are WNL. No limitations in general ROM. No crepitus or effusions noted. Foot type and digits show no abnormalities. HAV  B/L. Skin: No Porokeratosis. No infection or ulcers  Diagnosis:  Onychomycosis, , Pain in right toe, pain in left toes,  Diabetes with vascular disease. HAV  B/L.  Treatment & Plan Procedures and Treatment: Consent by patient was obtained for treatment procedures.  D iabetic shoes were measured and casted.  She will be called when her shoes arrive.  She should return to the office in 2 months for preventative foot care services Return Visit-Office Procedure: Patient instructed to return to the office for a follow up visit 2 months for continued evaluation and treatment.    Gardiner Barefoot DPM

## 2017-02-10 ENCOUNTER — Telehealth: Payer: Self-pay | Admitting: Cardiovascular Disease

## 2017-02-10 NOTE — Telephone Encounter (Signed)
She reports that her mother is putting on pounds with no swelling. She has had increased appetite and eating more with weight gain and no swelling to extremities. They stated that she took a lasix and was going to the bathroom frequently and was concerned if she had a bladder infection. Advised her to continue monitoring her weights, and to watch for any swelling, along with recommendations to check with her Primary care provider about possible bladder or some other urinary type infection. She verbalized understanding of our conversation, agreement with plan, and had no further questions at this time. Instructed her to please give me a call back if she should have any further questions.

## 2017-02-10 NOTE — Telephone Encounter (Signed)
Pt daughter calling stating pt has some weight gain but is not swelling anywhere. She states she has been eating more but not sure if that is it She states when they give her lasix it feels to be lasting a few days  Pt complains of her sides hurting, towards back  Would like some advise on this Please call back

## 2017-02-11 DIAGNOSIS — R350 Frequency of micturition: Secondary | ICD-10-CM | POA: Diagnosis not present

## 2017-02-11 DIAGNOSIS — I10 Essential (primary) hypertension: Secondary | ICD-10-CM | POA: Diagnosis not present

## 2017-03-08 ENCOUNTER — Ambulatory Visit (INDEPENDENT_AMBULATORY_CARE_PROVIDER_SITE_OTHER): Payer: PPO | Admitting: Orthotics

## 2017-03-08 DIAGNOSIS — M2011 Hallux valgus (acquired), right foot: Secondary | ICD-10-CM

## 2017-03-08 DIAGNOSIS — M2012 Hallux valgus (acquired), left foot: Secondary | ICD-10-CM | POA: Diagnosis not present

## 2017-03-08 DIAGNOSIS — E1159 Type 2 diabetes mellitus with other circulatory complications: Secondary | ICD-10-CM

## 2017-03-08 DIAGNOSIS — E1142 Type 2 diabetes mellitus with diabetic polyneuropathy: Secondary | ICD-10-CM | POA: Diagnosis not present

## 2017-03-08 NOTE — Progress Notes (Signed)

## 2017-03-14 DIAGNOSIS — M24542 Contracture, left hand: Secondary | ICD-10-CM | POA: Diagnosis not present

## 2017-03-15 ENCOUNTER — Other Ambulatory Visit: Payer: Self-pay | Admitting: Cardiovascular Disease

## 2017-03-22 DIAGNOSIS — R32 Unspecified urinary incontinence: Secondary | ICD-10-CM | POA: Diagnosis not present

## 2017-03-22 DIAGNOSIS — H9202 Otalgia, left ear: Secondary | ICD-10-CM | POA: Diagnosis not present

## 2017-03-22 DIAGNOSIS — R0981 Nasal congestion: Secondary | ICD-10-CM | POA: Diagnosis not present

## 2017-04-02 ENCOUNTER — Emergency Department
Admission: EM | Admit: 2017-04-02 | Discharge: 2017-04-02 | Disposition: A | Discharge status: Home / Self Care | Payer: PPO | Attending: Emergency Medicine | Admitting: Emergency Medicine

## 2017-04-02 ENCOUNTER — Other Ambulatory Visit: Payer: Self-pay

## 2017-04-02 DIAGNOSIS — I5032 Chronic diastolic (congestive) heart failure: Secondary | ICD-10-CM | POA: Insufficient documentation

## 2017-04-02 DIAGNOSIS — I110 Hypertensive heart disease with heart failure: Secondary | ICD-10-CM | POA: Diagnosis not present

## 2017-04-02 DIAGNOSIS — Z79899 Other long term (current) drug therapy: Secondary | ICD-10-CM | POA: Insufficient documentation

## 2017-04-02 DIAGNOSIS — K625 Hemorrhage of anus and rectum: Secondary | ICD-10-CM

## 2017-04-02 DIAGNOSIS — E119 Type 2 diabetes mellitus without complications: Secondary | ICD-10-CM | POA: Insufficient documentation

## 2017-04-02 LAB — CBC
HCT: 35.1 % (ref 35.0–47.0)
Hemoglobin: 12 g/dL (ref 12.0–16.0)
MCH: 31.8 pg (ref 26.0–34.0)
MCHC: 34.1 g/dL (ref 32.0–36.0)
MCV: 93.2 fL (ref 80.0–100.0)
PLATELETS: 201 10*3/uL (ref 150–440)
RBC: 3.77 MIL/uL — AB (ref 3.80–5.20)
RDW: 15 % — AB (ref 11.5–14.5)
WBC: 7.5 10*3/uL (ref 3.6–11.0)

## 2017-04-02 LAB — COMPREHENSIVE METABOLIC PANEL
ALT: 25 U/L (ref 14–54)
AST: 31 U/L (ref 15–41)
Albumin: 4.2 g/dL (ref 3.5–5.0)
Alkaline Phosphatase: 46 U/L (ref 38–126)
Anion gap: 9 (ref 5–15)
BUN: 47 mg/dL — AB (ref 6–20)
CALCIUM: 9.6 mg/dL (ref 8.9–10.3)
CHLORIDE: 98 mmol/L — AB (ref 101–111)
CO2: 28 mmol/L (ref 22–32)
CREATININE: 0.75 mg/dL (ref 0.44–1.00)
Glucose, Bld: 132 mg/dL — ABNORMAL HIGH (ref 65–99)
Potassium: 4.2 mmol/L (ref 3.5–5.1)
Sodium: 135 mmol/L (ref 135–145)
Total Bilirubin: 0.7 mg/dL (ref 0.3–1.2)
Total Protein: 6.9 g/dL (ref 6.5–8.1)

## 2017-04-02 LAB — TYPE AND SCREEN
ABO/RH(D): A POS
Antibody Screen: NEGATIVE

## 2017-04-02 MED ORDER — POLYETHYLENE GLYCOL 3350 17 G PO PACK: 17.0000 g | PACK | Freq: Every day | ORAL | 0 refills | Status: DC

## 2017-04-02 NOTE — ED Provider Notes (Addendum)
[facility] Emergency Department Provider Note  ____________________________________________   I have reviewed the triage vital signs and the nursing notes. Where available I have reviewed prior notes and, if possible and indicated, outside hospital notes.    HISTORY  Chief Complaint Rectal Bleeding    HPI [name] is a 84 y.o. female who is on Plavix and aspirin presents today complaining of having had 1 or 2 drops of blood from her bottom noticed today.  No fever no chills no abdominal pain no lightheaded, no history of GI bleed in the past, has recently been constipated and taking stool softeners which seem to have helped her constipation.  Patient has no other complaints.  She is at baseline somewhat demented, at her baseline, at this time.  Level 5 chart caveat; no further history available due to patient status. So the history is per family, they noted on her diaper a few drops of blood.  From her rectum it appeared.  Posterior anyway on the diaper. The extent that they know, her bowel movements have been normal otherwise aside from hard, there is been no melena or other concerns.  Past Medical History:  Diagnosis Date  . Anemia   . Carotid arterial disease ([place])    a. 08/2014 U/S: Bilateral < 50% stenosis. Patent vertebrals w/ antegrade flow.   . Cervical spine arthritis   . Chronic diastolic CHF (congestive heart failure) ([place])    a. echo 2014: EF 55-60%, DD;  b. 08/2014 Echo: EF 55-60%, no RWMA, GR1DD; c. 02/2016 Echo: EF 65-70%, Gr1 DD.  Marland Kitchen Cognitive impairment   . Depression   . Diabetes mellitus without complication ([place])   . Essential hypertension   . GERD (gastroesophageal reflux disease)   . Glaucoma   . Gout   . History of renal impairment   . Hyperlipidemia   . Hypotension    a. Related to Norvasc - 11/2014 ED visit.  . Multinodular goiter    a. Noted incidentally on CT 07/2012 and carotid U/S 08/2014;  b. Nl TSH 11/2014.  Marland Kitchen  Paroxysmal SVT (supraventricular tachycardia) ([facility])    a. on Toprol   . Prolapsed uterus   . Severe aortic stenosis    a. 11/15/16: s/p TAVR Edwards Sapien 3 THV (size 23 mm, model # 9600TFX, serial # [id number])  . Stroke [facility])    a. CT head 07/2014 showed small thalamic infarct  . Vitamin deficiency     Patient Active Problem List   Diagnosis Date Noted  . Severe aortic stenosis 11/15/2016  . Cognitive impairment 09/26/2016  . Gout 09/26/2016  . Hiatal hernia with gastroesophageal reflux 09/26/2016  . Lumbar and sacral spondylarthritis 09/26/2016  . Multiple lacunar infarcts 09/26/2016  . Multiple pulmonary nodules 09/26/2016  . Obesity 09/26/2016  . Recurrent UTI 09/26/2016  . Shoulder pain, bilateral 09/26/2016  . Trigeminal neuralgia 09/26/2016  . Urinary leakage 09/26/2016  . Uterine prolapse 09/26/2016  . Vitamin D deficiency 09/26/2016  . Sepsis ([place])   . Osteoporosis 10/29/2015  . Hypertension   . Hyperlipidemia   . PSVT (paroxysmal supraventricular tachycardia) ([place])   . Multiple thyroid nodules   . Carotid arterial disease ([place])   . Sleepiness 12/25/2014  . Renal insufficiency 08/28/2014  . Hydronephrosis, bilateral 08/28/2014  . Hammer toe of left foot 03/10/2014  . Hammer toe of right foot 03/10/2014  . Onychogryphosis 03/10/2014  . Orofacial dyskinesia 02/28/2013  . Diabetes type 2, controlled ([place]) 12/17/2012  . SOB (shortness of breath)  12/17/2012  . Chronic diastolic CHF (congestive heart failure) ([place]) 12/17/2012  . Hypomagnesemia 09/22/2012  . Cervical spine arthritis 06/29/2010    Past Surgical History:  Procedure Laterality Date  . EYE SURGERY     unsure of what procedure, did know that laser was used  . IR RADIOLOGY PERIPHERAL GUIDED IV START  11/03/2016  . IR US GUIDE VASC ACCESS RIGHT  11/03/2016  . RIGHT/LEFT HEART CATH AND CORONARY ANGIOGRAPHY N/A 10/26/2016   Procedure: RIGHT/LEFT HEART CATH AND CORONARY ANGIOGRAPHY;  Surgeon: [name],  MD;  Location: [facility];  Service: Cardiovascular;  Laterality: N/A;  . TEE WITHOUT CARDIOVERSION N/A 03/31/2016   Procedure: TRANSESOPHAGEAL ECHOCARDIOGRAM (TEE);  Surgeon: Minna Merritts, MD;  Location: [facility];  Service: Cardiovascular;  Laterality: N/A;  . TEE WITHOUT CARDIOVERSION N/A 11/15/2016   Procedure: TRANSESOPHAGEAL ECHOCARDIOGRAM (TEE);  Surgeon: [name], MD;  Location: [place];  Service: Open Heart Surgery;  Laterality: N/A;  . TRANSCATHETER AORTIC VALVE REPLACEMENT, TRANSFEMORAL N/A 11/15/2016   Procedure: TRANSCATHETER AORTIC VALVE REPLACEMENT, TRANSFEMORAL;  Surgeon: [name], MD;  Location: [place];  Service: Open Heart Surgery;  Laterality: N/A;  . TUBAL LIGATION      Prior to Admission medications   Medication Sig Start Date End Date Taking? Authorizing Provider  ACCU-CHEK AVIVA PLUS test strip USE 1 STRIP TO TEST BLOOD SUGARS 3 TIMES A DAY 12/09/16   [provider]  acetaminophen (TYLENOL) 500 MG tablet Take 500 mg by mouth every 6 (six) hours as needed for mild pain or headache.    [provider]  allopurinol (ZYLOPRIM) 100 MG tablet Take 100 mg by mouth daily.    [provider]  Amino Acids-Protein Hydrolys (FEEDING SUPPLEMENT, PRO-STAT SUGAR FREE 64,) LIQD Take 30 mLs by mouth 3 (three) times daily with meals.    [provider]  aspirin 81 MG chewable tablet Chew 81 mg by mouth daily.    [provider]  brimonidine (ALPHAGAN P) 0.1 % SOLN Place 1 drop into the right eye 2 (two) times daily.    [provider]  cholecalciferol (VITAMIN D) 1000 units tablet Take 1,000 Units by mouth daily.    [provider]  clopidogrel (PLAVIX) 75 MG tablet Take 1 tablet (75 mg total) by mouth daily with breakfast. 11/17/16   Eileen Stanford, PA-C  Dorzolamide HCl-Timolol Mal (COSOPT OP) Place 1 drop into the right eye 2 (two) times daily.     [provider]  feeding supplement, ENSURE  ENLIVE, (ENSURE ENLIVE) LIQD Take 237 mLs by mouth 2 (two) times daily between meals. 04/01/16   Fritzi Mandes, MD  furosemide (LASIX) 20 MG tablet Take 1 tablet (20 mg total) by mouth daily. Take an additional 20mg  if weight >125lbs 12/08/16 12/08/17  Eileen Stanford, PA-C  gabapentin (NEURONTIN) 300 MG capsule Take 300 mg by mouth 2 (two) times daily.    [provider]  hydrALAZINE (APRESOLINE) 25 MG tablet Take 1 tablet (25 mg total) by mouth 3 (three) times daily. Hold only if systolic blood pressure (top number) is 100 or below 12/08/16 03/08/17  Eileen Stanford, PA-C  HYDROcodone-acetaminophen (NORCO/VICODIN) 5-325 MG tablet Take 1 tablet by mouth every 6 (six) hours as needed for moderate pain. 09/02/16   Baxter Hire, MD  iron polysaccharides (NIFEREX) 150 MG capsule Take 150 mg by mouth 2 (two) times daily.    [provider]  isosorbide mononitrate (IMDUR) 30 MG 24 hr tablet  Take 1 tablet (30 mg total) by mouth daily. 09/03/16   Baxter Hire, MD  KLOR-CON 10 10 MEQ tablet TAKE 1 TABLET BY MOUTH EVERY DAY TAKE WITH LASIX 03/16/17   Minna Merritts, MD  latanoprost (XALATAN) 0.005 % ophthalmic solution Place 1 drop into the right eye at bedtime. 07/03/14   [provider]  lidocaine (LIDODERM) 5 % Place onto the skin. 04/01/16   [provider]  lisinopril (PRINIVIL,ZESTRIL) 10 MG tablet Take 10 mg by mouth daily. HOLD FOR BLOOD PRESSURE LOWER THAN 90/60    [provider]  LORazepam (ATIVAN) 0.5 MG tablet Take 1 tablet (0.5 mg total) by mouth at bedtime. Take 1 tablet at bedtime as needed for sleep or anxiety Patient taking differently: Take 0.5-1 mg by mouth See admin instructions. Take 1 mg by mouth at bedtime and may take and additional  0.5mg  during the day as needed for anxiety 09/02/16 09/02/17  Baxter Hire, MD  lovastatin (MEVACOR) 40 MG tablet Take 40 mg by mouth at bedtime.    [provider]  metoprolol succinate (TOPROL-XL)  25 MG 24 hr tablet Take 25 mg by mouth daily.    [provider]  Multiple Vitamin (MULTI-VITAMIN PO) Take 1 tablet by mouth daily.    [provider]  pantoprazole (PROTONIX) 40 MG tablet Take 40 mg by mouth daily.    [provider]  pilocarpine (PILOCAR) 4 % ophthalmic solution Place 1 drop into the right eye 4 (four) times daily.     [provider]  senna-docusate (SENOKOT-S) 8.6-50 MG tablet Take 2 tablets by mouth daily as needed for mild constipation.     [provider]  sertraline (ZOLOFT) 50 MG tablet Take 50 mg by mouth daily.    [provider]  traZODone (DESYREL) 100 MG tablet Take 100 mg by mouth at bedtime.    [provider]  vitamin C (ASCORBIC ACID) 500 MG tablet Take 500 mg by mouth daily.    [provider]  Zinc Oxide 10 % OINT Apply 1 application topically daily as needed (rash). Applied to patient's bottom    [provider]    Allergies Metformin and Metformin and related  Family History  Problem Relation Age of Onset  . Glaucoma Mother   . Diabetes Mother   . Peptic Ulcer Father   . Colitis Father     Social History Social History   Tobacco Use  . Smoking status: Never Smoker  . Smokeless tobacco: Never Used  Substance Use Topics  . Alcohol use: No  . Drug use: No    Review of Systems Constitutional: No fever/chills Eyes: No visual changes. ENT: No sore throat. No stiff neck no neck pain Cardiovascular: Denies chest pain. Respiratory: Denies shortness of breath. Gastrointestinal:   no vomiting.  No diarrhea.  + constipation. Genitourinary: Negative for dysuria. Musculoskeletal: Negative lower extremity swelling Skin: Negative for rash. Neurological: Negative for severe headaches, focal weakness or numbness.   ____________________________________________   PHYSICAL EXAM:  VITAL SIGNS: ED Triage Vitals  Enc Vitals Group     BP 04/02/17 1521 (!) 124/56      Pulse Rate 04/02/17 1521 65     Resp 04/02/17 1521 18     Temp 04/02/17 1521 98.3 F (36.8 C)     Temp Source 04/02/17 1521 Oral     SpO2 04/02/17 1521 98 %     Weight 04/02/17 1523 138 lb (62.6 kg)  Height 04/02/17 1523 5' (1.524 m)     Head Circumference --      Peak Flow --      Pain Score 04/02/17 1523 0     Pain Loc --      Pain Edu? --      Excl. in [place]? --     Constitutional: Alert and oriented to name and place unsure of the date. Well appearing and in no acute distress. Eyes: Conjunctivae are normal Head: Atraumatic HEENT: No congestion/rhinnorhea. Mucous membranes are moist.  Oropharynx non-erythematous Neck:   Nontender with no meningismus, no masses, no stridor Cardiovascular: Normal rate, regular rhythm. Grossly normal heart sounds.  Good peripheral circulation. Respiratory: Normal respiratory effort.  No retractions. Lungs CTAB. Abdominal: Soft and nontender. No distention. No guarding no rebound Back:  There is no focal tenderness or step off.  there is no midline tenderness there are no lesions noted. there is no CVA tenderness Rectal exam, there is no obvious fissure or external hemorrhoid, on rectal exam I do feel that there is a possibly of a internal hemorrhoid although it is difficult to be sure, there is very slight pinkish effluvium noted which is guaiac positive, Musculoskeletal: No lower extremity tenderness, no upper extremity tenderness. No joint effusions, no DVT signs strong distal pulses no edema Neurologic:  Normal speech and language. No gross focal neurologic deficits are appreciated.  Skin:  Skin is warm, dry and intact. No rash noted. Psychiatric: Mood and affect are normal. Speech and behavior are normal.  ____________________________________________   LABS (all labs ordered are listed, but only abnormal results are displayed)  Labs Reviewed  COMPREHENSIVE METABOLIC PANEL - Abnormal; Notable for the following components:      Result Value    Chloride 98 (*)    Glucose, Bld 132 (*)    BUN 47 (*)    All other components within normal limits  CBC - Abnormal; Notable for the following components:   RBC 3.77 (*)    RDW 15.0 (*)    All other components within normal limits  POC OCCULT BLOOD, ED  TYPE AND SCREEN    Pertinent labs  results that were available during my care of the patient were reviewed by me and considered in my medical decision making (see chart for details). ____________________________________________  EKG  I personally interpreted any EKGs ordered by me or triage  ____________________________________________  RADIOLOGY  Pertinent labs & imaging results that were available during my care of the patient were reviewed by me and considered in my medical decision making (see chart for details). If possible, patient and/or family made aware of any abnormal findings.  No results found. ____________________________________________    PROCEDURES  Procedure(s) performed: None  Procedures  Critical Care performed: None  ____________________________________________   INITIAL IMPRESSION / ASSESSMENT AND PLAN / ED COURSE  Pertinent labs & imaging results that were available during my care of the patient were reviewed by me and considered in my medical decision making (see chart for details).  Patient here with a few drops of blood most likely from her rectum after being constipated.  She has had a colonoscopy in the past which showed polyps but no diverticular disease to the extent that I can see her outside records.  She is hemodynamically intact with no evidence of decompensation from the slight rectal bleeding, hemoglobin is 12 which is very good for her, they noticed this yesterday and today there is been no evidence of decompensation.  She  does take Plavix and aspirin, which can increase the risk of minor bleeding.  No evidence of major bleeding.  Patient is 84 years old, I did explain to the family that  it is quite possible that she could bleed more but at this time we do not see any evidence of acute significant bleed, and at her age being admitted to the hospital often can present with its own complications.  They are comfortable with outpatient follow-up if provided, will discuss with GI medicine to ensure that she seems to be a reasonable plan for them.  I did do a limited vaginal exam I do not see at this time any evidence of vaginal bleeding.  ----------------------------------------- 5:13 PM on 04/02/2017 -----------------------------------------  I discussed with Dr. Marius Ditch, of GI.  She and I talked about the patient's medication regimen, her findings on blood work and exam etc.  She agrees that patient does not require admission to the hospital at this time.  She did suggest given recent constipation that the patient be given MiraLAX at home, she also suggest the patient continue the Colace.  She also suggest that imaging is not indicated and I agree.  Patient has no history of diverticulitis or diverticulosis and she has no abdominal pain or tenderness, she has had a few drops of bright red blood per rectum.  Given her age I think she will do much better at home.  Family does understand however that there is a risk that she might bleed more and they are comfortable with this, they understand the need to come back for significant bleeding and they will follow closely as an outpatient.  I did advise him to hold the Plavix and aspirin until seen by PCP or GI    ____________________________________________   FINAL CLINICAL IMPRESSION(S) / ED DIAGNOSES  Final diagnoses:  None      This chart was dictated using voice recognition software.  Despite best efforts to proofread,  errors can occur which can change meaning.      Schuyler Amor, MD 04/02/17 1644    Schuyler Amor, MD 04/02/17 1714

## 2017-04-02 NOTE — Discharge Instructions (Signed)
I would avoid Plavix and aspirin for the next few days, follow closely with your primary care doctor and the GI doctors listed.  Return to the emergency room for any new or worrisome symptoms including significant bleeding or lightheadedness.  At this time, we feel that it is better for her to go home rather than be admitted, as we do not see any evidence of significant bleeding.  Obviously, we cannot see the future and that could change.  If there is significant bleeding or there is other concerns we strongly recommend return to the emergency room, if there does seem to be significant bleeding, as discussed, consider calling 911.  Continue to treat her constipation we will give you medications to help with that, and please follow closely up with GI and primary care.

## 2017-04-02 NOTE — ED Triage Notes (Addendum)
Pt arrives to ED with daughter. Pt lives alone and family member told daughter that brought pt today that they saw bright red blood "dripping" from lower area (either rectum or vaginal). Noticed today. Daughter unsure of which. Pt had heart valve replacement in September 2018 at [facility] (bovine valve). Takes plavix. Pt alert and answering questions. Daughter states weak more recently.

## 2017-04-03 ENCOUNTER — Ambulatory Visit (INDEPENDENT_AMBULATORY_CARE_PROVIDER_SITE_OTHER): Payer: PPO | Admitting: Podiatry

## 2017-04-03 ENCOUNTER — Encounter: Payer: Self-pay | Admitting: Podiatry

## 2017-04-03 DIAGNOSIS — M2012 Hallux valgus (acquired), left foot: Secondary | ICD-10-CM

## 2017-04-03 DIAGNOSIS — D689 Coagulation defect, unspecified: Secondary | ICD-10-CM

## 2017-04-03 DIAGNOSIS — E1159 Type 2 diabetes mellitus with other circulatory complications: Secondary | ICD-10-CM | POA: Diagnosis not present

## 2017-04-03 DIAGNOSIS — E1142 Type 2 diabetes mellitus with diabetic polyneuropathy: Secondary | ICD-10-CM | POA: Diagnosis not present

## 2017-04-03 DIAGNOSIS — B351 Tinea unguium: Secondary | ICD-10-CM

## 2017-04-03 NOTE — Progress Notes (Signed)
Complaint:  Visit Type: Patient returns to my office for continued preventative foot care services. Complaint: Patient states" my nails have grown long and thick and become painful to walk and wear shoes" Patient has been diagnosed with DM with no foot complications. The patient presents for preventative foot care services. No changes to ROS.  Patient is taking plavix. Patient has been wearing her diabetic shoes without problems according to her daughter.  Podiatric Exam: Vascular: dorsalis pedis  pulses are weakly palpable bilateral. PT pulses are not palpable  B/L. Capillary return is immediate. Temperature gradient is WNL. Skin turgor WNL  Sensorium: Normal Semmes Weinstein monofilament test. Normal tactile sensation bilaterally. Nail Exam: Pt has thick disfigured discolored nails with subungual debris noted bilateral entire nail hallux through fifth toenails Ulcer Exam: There is no evidence of ulcer or pre-ulcerative changes or infection. Orthopedic Exam: Muscle tone and strength are WNL. No limitations in general ROM. No crepitus or effusions noted. Foot type and digits show no abnormalities. HAV  B/L. Skin: No Porokeratosis. No infection or ulcers  Diagnosis:  Onychomycosis, , Pain in right toe, pain in left toes,  Diabetes with vascular disease. HAV  B/L.  Treatment & Plan Procedures and Treatment: Consent by patient was obtained for treatment procedures.   Debridement of mycotic and hypertrophic toenails, 1 through 5 bilateral and clearing of subungual debris. No ulceration, no infection noted. Return Visit-Office Procedure: Patient instructed to return to the office for a follow up visit 3 months for continued evaluation and treatment.    Gardiner Barefoot DPM

## 2017-04-11 ENCOUNTER — Other Ambulatory Visit: Payer: Self-pay

## 2017-04-11 ENCOUNTER — Encounter: Payer: Self-pay | Admitting: Gastroenterology

## 2017-04-11 ENCOUNTER — Ambulatory Visit (INDEPENDENT_AMBULATORY_CARE_PROVIDER_SITE_OTHER): Payer: PPO | Admitting: Gastroenterology

## 2017-04-11 ENCOUNTER — Telehealth: Payer: Self-pay | Admitting: Cardiovascular Disease

## 2017-04-11 VITALS — BP 145/85 | HR 69 | Temp 97.5°F | Ht 60.0 in | Wt 137.6 lb

## 2017-04-11 DIAGNOSIS — I509 Heart failure, unspecified: Secondary | ICD-10-CM | POA: Insufficient documentation

## 2017-04-11 DIAGNOSIS — K625 Hemorrhage of anus and rectum: Secondary | ICD-10-CM | POA: Diagnosis not present

## 2017-04-11 NOTE — Telephone Encounter (Signed)
Called Contact number patient was unavailable to talk asked to speak with [name] who called  she was not available as well asked them to get her to call me back when she could. The person that answered the phone was not on the patients contacts list.

## 2017-04-11 NOTE — Telephone Encounter (Signed)
Patient daughter calling She states the isosorbide mononitrate (IMDUR) from [facility] in [place] is being refilled as twice a day when it should only be 1 tablet daily Please advise

## 2017-04-11 NOTE — Progress Notes (Signed)
Cephas Darby, MD [address]  [place]  [place], [place] [address]  Main: [phone]  Fax: [phone]    Gastroenterology Consultation  Referring Provider:     Tegeler, Loma Sousa, MD Primary Care Physician:  Tegeler, Loma Sousa, MD Primary Gastroenterologist:  Dr. Cephas Darby Reason for Consultation:     Rectal bleeding        HPI:   [name] is a 84 y.o. female referred by Dr. Sherry Ruffing, Loma Sousa, MD  for consultation & management of rectal bleeding. Patient is accompanied by her daughter today. Patient has history of bioprosthetic aortic valve replacement in September 2018 in [place].  She is on aspirin as well as Plavix since then. Patient went to ER on 04/02/2017 secondary to rectal bleeding, described as blood dripping in the toilet. her hemoglobin in ER was 12, which is at her baseline.CT scan from 02/2016 revealed severe colonic diverticulosis. Patient was slightly constipated prior to this episode. Her daughter reports that her bowel movements have been fairly regular. She did not have any episodes of rectal bleeding since the ER visit. She was told to stop taking aspirin and Plavix by the ER physician. Her cardiologist is not aware of discontinuation of anticoagulation. She otherwise denies any GI symptoms. She is dependent on most of her daily activities  NSAIDs: none  Antiplts/Anticoagulants/Anti thrombotics: aspirin and Plavix which were held since 04/02/2017  GI Procedures: reports having had a colonoscopy several years ago.  Past Medical History:  Diagnosis Date  . Anemia   . Carotid arterial disease ([facility])    a. 08/2014 U/S: Bilateral < 50% stenosis. Patent vertebrals w/ antegrade flow.   . Cervical spine arthritis   . Chronic diastolic CHF (congestive heart failure) ([facility])    a. echo 2014: EF 55-60%, DD;  b. 08/2014 Echo: EF 55-60%, no RWMA, GR1DD; c. 02/2016 Echo: EF 65-70%, Gr1 DD.  Marland Kitchen Cognitive impairment   . Depression   .  Diabetes mellitus without complication ([facility])   . Essential hypertension   . GERD (gastroesophageal reflux disease)   . Glaucoma   . Gout   . History of renal impairment   . Hyperlipidemia   . Hypotension    a. Related to Norvasc - 11/2014 ED visit.  . Multinodular goiter    a. Noted incidentally on CT 07/2012 and carotid U/S 08/2014;  b. Nl TSH 11/2014.  Marland Kitchen Paroxysmal SVT (supraventricular tachycardia) ([facility])    a. on Toprol   . Prolapsed uterus   . Severe aortic stenosis    a. 11/15/16: s/p TAVR Edwards Sapien 3 THV (size 23 mm, model # 9600TFX, serial # [id number])  . Stroke [facility])    a. CT head 07/2014 showed small thalamic infarct  . Vitamin deficiency     Past Surgical History:  Procedure Laterality Date  . EYE SURGERY     unsure of what procedure, did know that laser was used  . IR RADIOLOGY PERIPHERAL GUIDED IV START  11/03/2016  . IR US GUIDE VASC ACCESS RIGHT  11/03/2016  . RIGHT/LEFT HEART CATH AND CORONARY ANGIOGRAPHY N/A 10/26/2016   Procedure: RIGHT/LEFT HEART CATH AND CORONARY ANGIOGRAPHY;  Surgeon: [name], MD;  Location: [facility];  Service: Cardiovascular;  Laterality: N/A;  . TEE WITHOUT CARDIOVERSION N/A 03/31/2016   Procedure: TRANSESOPHAGEAL ECHOCARDIOGRAM (TEE);  Surgeon: Minna Merritts, MD;  Location: [facility];  Service: Cardiovascular;  Laterality: N/A;  . TEE WITHOUT CARDIOVERSION N/A 11/15/2016   Procedure: TRANSESOPHAGEAL  ECHOCARDIOGRAM (TEE);  Surgeon: [name], MD;  Location: [facility];  Service: Open Heart Surgery;  Laterality: N/A;  . TRANSCATHETER AORTIC VALVE REPLACEMENT, TRANSFEMORAL N/A 11/15/2016   Procedure: TRANSCATHETER AORTIC VALVE REPLACEMENT, TRANSFEMORAL;  Surgeon: [name], MD;  Location: [facility];  Service: Open Heart Surgery;  Laterality: N/A;  . TUBAL LIGATION      Prior to Admission medications   Medication Sig Start Date End Date Taking? Authorizing Provider  ACCU-CHEK AVIVA PLUS test strip USE 1 STRIP TO TEST BLOOD  SUGARS 3 TIMES A DAY 12/09/16  Yes [provider]  acetaminophen (TYLENOL) 500 MG tablet Take 500 mg by mouth every 6 (six) hours as needed for mild pain or headache.   Yes [provider]  allopurinol (ZYLOPRIM) 100 MG tablet Take 100 mg by mouth daily.   Yes [provider]  Amino Acids-Protein Hydrolys (FEEDING SUPPLEMENT, PRO-STAT SUGAR FREE 64,) LIQD Take 30 mLs by mouth 3 (three) times daily with meals.   Yes [provider]  amoxicillin (AMOXIL) 500 MG tablet Take 500 mg by mouth 3 (three) times daily. 03/22/17  Yes [provider]  brimonidine (ALPHAGAN P) 0.1 % SOLN Place 1 drop into the right eye 2 (two) times daily.   Yes [provider]  cephALEXin (KEFLEX) 500 MG capsule TAKE 1 CAPSULE (ORAL) EVERY 6 HOURS FOR 7 DAYS 02/11/17  Yes [provider]  cholecalciferol (VITAMIN D) 1000 units tablet Take 1,000 Units by mouth daily.   Yes [provider]  Dorzolamide HCl-Timolol Mal (COSOPT OP) Place 1 drop into the right eye 2 (two) times daily.    Yes [provider]  feeding supplement, ENSURE ENLIVE, (ENSURE ENLIVE) LIQD Take 237 mLs by mouth 2 (two) times daily between meals. 04/01/16  Yes Fritzi Mandes, MD  fluticasone (FLONASE) 50 MCG/ACT nasal spray Place 1 spray into both nostrils 2 (two) times daily. 03/22/17  Yes [provider]  furosemide (LASIX) 20 MG tablet Take 1 tablet (20 mg total) by mouth daily. Take an additional 20mg  if weight >125lbs 12/08/16 12/08/17 Yes Eileen Stanford, PA-C  gabapentin (NEURONTIN) 300 MG capsule Take 300 mg by mouth 2 (two) times daily.   Yes [provider]  glucose blood test strip Accu-Chek Aviva Plus test strips   Yes [provider]  HYDROcodone-acetaminophen (NORCO/VICODIN) 5-325 MG tablet Take 1 tablet by mouth every 6 (six) hours as needed for moderate pain. 09/02/16  Yes Baxter Hire, MD  iron polysaccharides (NIFEREX) 150 MG capsule Take  150 mg by mouth 2 (two) times daily.   Yes [provider]  isosorbide mononitrate (IMDUR) 30 MG 24 hr tablet Take 1 tablet (30 mg total) by mouth daily. 09/03/16  Yes Baxter Hire, MD  KLOR-CON 10 10 MEQ tablet TAKE 1 TABLET BY MOUTH EVERY DAY TAKE WITH LASIX 03/16/17  Yes Gollan, Kathlene November, MD  latanoprost (XALATAN) 0.005 % ophthalmic solution Place 1 drop into the right eye at bedtime. 07/03/14  Yes [provider]  latanoprost (XALATAN) 0.005 % ophthalmic solution latanoprost 0.005 % eye drops   Yes [provider]  lidocaine (LIDODERM) 5 % Place onto the skin. 04/01/16  Yes [provider]  lisinopril (PRINIVIL,ZESTRIL) 10 MG tablet Take 10 mg by mouth daily. HOLD FOR BLOOD PRESSURE LOWER THAN 90/60   Yes [provider]  LORazepam (ATIVAN) 0.5 MG tablet Take 1 tablet (0.5 mg total) by mouth at bedtime. Take 1 tablet at bedtime as needed  for sleep or anxiety Patient taking differently: Take 0.5-1 mg by mouth See admin instructions. Take 1 mg by mouth at bedtime and may take and additional  0.5mg  during the day as needed for anxiety 09/02/16 09/02/17 Yes Baxter Hire, MD  lovastatin (MEVACOR) 40 MG tablet Take 40 mg by mouth at bedtime.   Yes [provider]  metoprolol succinate (TOPROL-XL) 25 MG 24 hr tablet Take 25 mg by mouth daily.   Yes [provider]  Multiple Vitamin (MULTI-VITAMIN PO) Take 1 tablet by mouth daily.   Yes [provider]  pantoprazole (PROTONIX) 40 MG tablet Take 40 mg by mouth daily.   Yes [provider]  pilocarpine (PILOCAR) 4 % ophthalmic solution Place 1 drop into the right eye 4 (four) times daily.    Yes [provider]  polyethylene glycol (MIRALAX) packet Take 17 g by mouth daily. 04/02/17  Yes Schuyler Amor, MD  senna-docusate (SENOKOT-S) 8.6-50 MG tablet Take 2 tablets by mouth daily as needed for mild constipation.    Yes [provider]  senna-docusate  (SENOKOT-S) 8.6-50 MG tablet Senna Plus 8.6 mg-50 mg tablet  TAKE 1 TO 2 TABLETS BY MOUTH TWICE A DAY AS NEEDED FOR CONSTIPATION   Yes [provider]  sertraline (ZOLOFT) 50 MG tablet Take 50 mg by mouth daily.   Yes [provider]  traZODone (DESYREL) 100 MG tablet Take 100 mg by mouth at bedtime.   Yes [provider]  vitamin C (ASCORBIC ACID) 500 MG tablet Take 500 mg by mouth daily.   Yes [provider]  Zinc Oxide 10 % OINT Apply 1 application topically daily as needed (rash). Applied to patient's bottom   Yes [provider]  aspirin 81 MG chewable tablet Chew 81 mg by mouth daily.    [provider]  clopidogrel (PLAVIX) 75 MG tablet Take 1 tablet (75 mg total) by mouth daily with breakfast. Patient not taking: Reported on 04/11/2017 11/17/16   Eileen Stanford, PA-C  hydrALAZINE (APRESOLINE) 25 MG tablet Take 1 tablet (25 mg total) by mouth 3 (three) times daily. Hold only if systolic blood pressure (top number) is 100 or below 12/08/16 03/08/17  Eileen Stanford, PA-C    Family History  Problem Relation Age of Onset  . Glaucoma Mother   . Diabetes Mother   . Peptic Ulcer Father   . Colitis Father      Social History   Tobacco Use  . Smoking status: Never Smoker  . Smokeless tobacco: Never Used  Substance Use Topics  . Alcohol use: No  . Drug use: No    Allergies as of 04/11/2017 - Review Complete 04/11/2017  Allergen Reaction Noted  . Metformin Other (See Comments), Diarrhea, and Nausea And Vomiting 12/06/2013  . Metformin and related Diarrhea 12/17/2012    Review of Systems:    All systems reviewed and negative except where noted in HPI.   Physical Exam:  BP (!) 145/85   Pulse 69   Temp (!) 97.5 F (36.4 C) (Oral)   Ht 5' (1.524 m)   Wt 137 lb 9.6 oz (62.4 kg)   BMI 26.87 kg/m  No LMP recorded. Patient is postmenopausal.  General:   Alert,  Well-developed, well-nourished, pleasant and cooperative  in NAD Head:  Normocephalic and atraumatic. Eyes:  Sclera clear, no icterus.   Conjunctiva pink. Ears:  Normal auditory acuity. Nose:  No deformity, discharge, or lesions. Mouth:  No deformity or  lesions,oropharynx pink & moist. Neck:  Supple; no masses or thyromegaly. Lungs:  Respirations even and unlabored.  Clear throughout to auscultation.   No wheezes, crackles, or rhonchi. No acute distress. Heart:  Regular rate and rhythm; no murmurs, clicks, rubs, or gallops. Abdomen:  Normal bowel sounds. Soft, non-tender and non-distended without masses, hepatosplenomegaly or hernias noted.  No guarding or rebound tenderness.   Rectal: Not performed Msk:  Symmetrical without gross deformities. Good, equal movement & strength bilaterally. Pulses:  Normal pulses noted. Extremities:  No clubbing or edema.  No cyanosis. Neurologic:  Alert and oriented x3;  grossly normal neurologically. Skin:  Intact without significant lesions or rashes. No jaundice. Psych:  Alert and cooperative. Normal mood and affect.  Imaging Studies: CT scan from 03/27/2016 revealed severe colonic diverticulosis  Assessment and Plan:   [name] is a 84 y.o. female with it history of aortic valve replacement, bioprosthetic valve on aspirin and Plavix, colonic diverticulosis seen in consultation for self-limiting episode of rectal bleeding, hemodynamically insignificant bleed. Most likely secondary to bleeding from hemorrhoids or less likely diverticular. She is currently off aspirin and Plavix.  - Do not recommend endoscopic evaluation at this time since the episode was self-limiting - Okay to restart aspirin and Plavix from GI standpoint - Follow-up with cardiology about the duration of anticoagulation as patient has porcine valve - Avoid constipation - Recommend to use preparation H per rectum 2-3 times daily for symptomatic hemorrhoids  Discuss my recommendations with patient's daughter who is  agreeable   Follow up as needed   Cephas Darby, MD

## 2017-04-12 ENCOUNTER — Other Ambulatory Visit: Payer: Self-pay

## 2017-04-12 MED ORDER — ISOSORBIDE MONONITRATE ER 30 MG PO TB24: 30.0000 mg | ORAL_TABLET | Freq: Every day | ORAL | 0 refills | Status: DC

## 2017-04-12 NOTE — Telephone Encounter (Signed)
Requested Prescriptions   Signed Prescriptions Disp Refills  . isosorbide mononitrate (IMDUR) 30 MG 24 hr tablet 90 tablet 0    Sig: Take 1 tablet (30 mg total) by mouth daily.    Authorizing Provider: Minna Merritts    Ordering User: Janan Ridge

## 2017-04-12 NOTE — Telephone Encounter (Signed)
Spoke with [name] to verify that all of our records show patient should be taking Imdur only once a day. She asked if I would send in a new prescription. Told her I would do so.

## 2017-04-28 ENCOUNTER — Other Ambulatory Visit: Payer: Self-pay | Admitting: Physician Assistant

## 2017-05-08 DIAGNOSIS — I471 Supraventricular tachycardia: Secondary | ICD-10-CM | POA: Diagnosis not present

## 2017-05-08 DIAGNOSIS — E1142 Type 2 diabetes mellitus with diabetic polyneuropathy: Secondary | ICD-10-CM | POA: Diagnosis not present

## 2017-05-08 DIAGNOSIS — F1320 Sedative, hypnotic or anxiolytic dependence, uncomplicated: Secondary | ICD-10-CM | POA: Diagnosis not present

## 2017-05-08 DIAGNOSIS — I10 Essential (primary) hypertension: Secondary | ICD-10-CM | POA: Diagnosis not present

## 2017-05-08 DIAGNOSIS — E042 Nontoxic multinodular goiter: Secondary | ICD-10-CM | POA: Diagnosis not present

## 2017-05-08 DIAGNOSIS — E1169 Type 2 diabetes mellitus with other specified complication: Secondary | ICD-10-CM | POA: Diagnosis not present

## 2017-05-08 DIAGNOSIS — N289 Disorder of kidney and ureter, unspecified: Secondary | ICD-10-CM | POA: Diagnosis not present

## 2017-05-08 DIAGNOSIS — N390 Urinary tract infection, site not specified: Secondary | ICD-10-CM | POA: Diagnosis not present

## 2017-05-08 DIAGNOSIS — E876 Hypokalemia: Secondary | ICD-10-CM | POA: Diagnosis not present

## 2017-05-08 DIAGNOSIS — E785 Hyperlipidemia, unspecified: Secondary | ICD-10-CM | POA: Diagnosis not present

## 2017-05-08 DIAGNOSIS — I350 Nonrheumatic aortic (valve) stenosis: Secondary | ICD-10-CM | POA: Diagnosis not present

## 2017-05-08 DIAGNOSIS — I5040 Unspecified combined systolic (congestive) and diastolic (congestive) heart failure: Secondary | ICD-10-CM | POA: Diagnosis not present

## 2017-05-28 ENCOUNTER — Other Ambulatory Visit: Payer: Self-pay | Admitting: Cardiovascular Disease

## 2017-06-23 ENCOUNTER — Telehealth: Payer: Self-pay | Admitting: Cardiovascular Disease

## 2017-06-23 ENCOUNTER — Other Ambulatory Visit: Payer: Self-pay

## 2017-06-23 MED ORDER — FUROSEMIDE 20 MG PO TABS: 20.0000 mg | ORAL_TABLET | Freq: Every day | ORAL | 0 refills | Status: DC

## 2017-06-23 NOTE — Telephone Encounter (Signed)
°*  STAT* If patient is at the pharmacy, call can be transferred to refill team.   1. Which medications need to be refilled? (please list name of each medication and dose if known) lasix   2. Which pharmacy/location (including street and city if local pharmacy) is medication to be sent to? [facility] [place]   3. Do they need a 30 day or 90 day supply? 30 day

## 2017-06-23 NOTE — Telephone Encounter (Signed)
furosemide (LASIX) 20 MG tablet 30 tablet 0 06/23/2017 06/23/2018   Sig - Route: Take 1 tablet (20 mg total) by mouth daily. Take an additional 20mg  if weight >125lbs - Oral   Sent to pharmacy as: furosemide (LASIX) 20 MG tablet   E-Prescribing Status: Receipt confirmed by pharmacy (06/23/2017 9:52 AM EDT)   Pharmacy   [id number] - [place], [place] - [address]

## 2017-06-28 ENCOUNTER — Other Ambulatory Visit: Payer: Self-pay | Admitting: Cardiovascular Disease

## 2017-06-28 ENCOUNTER — Other Ambulatory Visit: Payer: Self-pay | Admitting: *Deleted

## 2017-06-28 MED ORDER — FUROSEMIDE 20 MG PO TABS: 20.0000 mg | ORAL_TABLET | Freq: Every day | ORAL | 1 refills | Status: DC

## 2017-06-28 NOTE — Telephone Encounter (Signed)
Dr. Rockey Situ,  Per your last office note 12/2016- patient was instructed to stop plavix at the end of March. Please see notes from GI appt with Dr. Marius Ditch from 04/11/17.  Please confirm if the patient is still ok to stop plavix.

## 2017-06-28 NOTE — Telephone Encounter (Signed)
I spoke with pt's daughter concerning plavix refill request.  Pt was told to D/c plavix in March @ last OV with [facility] 11/18. Pt has been taking plavix up to this point in time.  Please advise.

## 2017-07-03 ENCOUNTER — Encounter: Payer: Self-pay | Admitting: Podiatry

## 2017-07-03 ENCOUNTER — Ambulatory Visit (INDEPENDENT_AMBULATORY_CARE_PROVIDER_SITE_OTHER): Payer: PPO | Admitting: Podiatry

## 2017-07-03 ENCOUNTER — Telehealth: Payer: Self-pay | Admitting: Cardiovascular Disease

## 2017-07-03 ENCOUNTER — Other Ambulatory Visit: Payer: Self-pay

## 2017-07-03 DIAGNOSIS — E1159 Type 2 diabetes mellitus with other circulatory complications: Secondary | ICD-10-CM

## 2017-07-03 DIAGNOSIS — B351 Tinea unguium: Secondary | ICD-10-CM | POA: Diagnosis not present

## 2017-07-03 DIAGNOSIS — M79676 Pain in unspecified toe(s): Secondary | ICD-10-CM | POA: Diagnosis not present

## 2017-07-03 DIAGNOSIS — D689 Coagulation defect, unspecified: Secondary | ICD-10-CM

## 2017-07-03 DIAGNOSIS — M2012 Hallux valgus (acquired), left foot: Secondary | ICD-10-CM

## 2017-07-03 MED ORDER — FUROSEMIDE 20 MG PO TABS: 20.0000 mg | ORAL_TABLET | Freq: Every day | ORAL | 2 refills | Status: DC

## 2017-07-03 NOTE — Progress Notes (Signed)
Complaint:  Visit Type: Patient returns to my office for continued preventative foot care services. Complaint: Patient states" my nails have grown long and thick and become painful to walk and wear shoes" Patient has been diagnosed with DM with no foot complications. The patient presents for preventative foot care services. No changes to ROS.  Patient is taking plavix. Patient has been wearing her diabetic shoes without problems according to her daughter.  Podiatric Exam: Vascular: dorsalis pedis  pulses are weakly palpable bilateral. PT pulses are not palpable  B/L. Capillary return is immediate. Temperature gradient is WNL. Skin turgor WNL  Sensorium: Normal Semmes Weinstein monofilament test. Normal tactile sensation bilaterally. Nail Exam: Pt has thick disfigured discolored nails with subungual debris noted bilateral entire nail hallux through fifth toenails Ulcer Exam: There is no evidence of ulcer or pre-ulcerative changes or infection. Orthopedic Exam: Muscle tone and strength are WNL. No limitations in general ROM. No crepitus or effusions noted. Foot type and digits show no abnormalities. HAV  B/L. Skin: No Porokeratosis. No infection or ulcers  Diagnosis:  Onychomycosis, , Pain in right toe, pain in left toes,  Diabetes with vascular disease. HAV  B/L.  Treatment & Plan Procedures and Treatment: Consent by patient was obtained for treatment procedures.   Debridement of mycotic and hypertrophic toenails, 1 through 5 bilateral and clearing of subungual debris. No ulceration, no infection noted. Return Visit-Office Procedure: Patient instructed to return to the office for a follow up visit 3 months for continued evaluation and treatment.    Gardiner Barefoot DPM

## 2017-07-04 NOTE — Telephone Encounter (Signed)
Pam,  Can you review this with Dr. Rockey Situ please?

## 2017-07-07 DIAGNOSIS — I130 Hypertensive heart and chronic kidney disease with heart failure and stage 1 through stage 4 chronic kidney disease, or unspecified chronic kidney disease: Secondary | ICD-10-CM | POA: Diagnosis not present

## 2017-07-07 DIAGNOSIS — F1320 Sedative, hypnotic or anxiolytic dependence, uncomplicated: Secondary | ICD-10-CM | POA: Diagnosis not present

## 2017-07-07 DIAGNOSIS — G500 Trigeminal neuralgia: Secondary | ICD-10-CM | POA: Diagnosis not present

## 2017-07-07 DIAGNOSIS — Z7984 Long term (current) use of oral hypoglycemic drugs: Secondary | ICD-10-CM | POA: Diagnosis not present

## 2017-07-07 DIAGNOSIS — E1122 Type 2 diabetes mellitus with diabetic chronic kidney disease: Secondary | ICD-10-CM | POA: Diagnosis not present

## 2017-07-07 DIAGNOSIS — I5032 Chronic diastolic (congestive) heart failure: Secondary | ICD-10-CM | POA: Diagnosis not present

## 2017-07-07 DIAGNOSIS — M25512 Pain in left shoulder: Secondary | ICD-10-CM | POA: Diagnosis not present

## 2017-07-07 DIAGNOSIS — G8929 Other chronic pain: Secondary | ICD-10-CM | POA: Diagnosis not present

## 2017-07-07 DIAGNOSIS — Z87440 Personal history of urinary (tract) infections: Secondary | ICD-10-CM | POA: Diagnosis not present

## 2017-07-07 DIAGNOSIS — M48061 Spinal stenosis, lumbar region without neurogenic claudication: Secondary | ICD-10-CM | POA: Diagnosis not present

## 2017-07-07 DIAGNOSIS — M810 Age-related osteoporosis without current pathological fracture: Secondary | ICD-10-CM | POA: Diagnosis not present

## 2017-07-07 DIAGNOSIS — Z952 Presence of prosthetic heart valve: Secondary | ICD-10-CM | POA: Diagnosis not present

## 2017-07-07 DIAGNOSIS — Z79891 Long term (current) use of opiate analgesic: Secondary | ICD-10-CM | POA: Diagnosis not present

## 2017-07-07 DIAGNOSIS — I6523 Occlusion and stenosis of bilateral carotid arteries: Secondary | ICD-10-CM | POA: Diagnosis not present

## 2017-07-07 DIAGNOSIS — M25511 Pain in right shoulder: Secondary | ICD-10-CM | POA: Diagnosis not present

## 2017-07-07 DIAGNOSIS — K219 Gastro-esophageal reflux disease without esophagitis: Secondary | ICD-10-CM | POA: Diagnosis not present

## 2017-07-07 DIAGNOSIS — E1142 Type 2 diabetes mellitus with diabetic polyneuropathy: Secondary | ICD-10-CM | POA: Diagnosis not present

## 2017-07-07 DIAGNOSIS — Z8673 Personal history of transient ischemic attack (TIA), and cerebral infarction without residual deficits: Secondary | ICD-10-CM | POA: Diagnosis not present

## 2017-07-07 DIAGNOSIS — N189 Chronic kidney disease, unspecified: Secondary | ICD-10-CM | POA: Diagnosis not present

## 2017-07-07 DIAGNOSIS — I352 Nonrheumatic aortic (valve) stenosis with insufficiency: Secondary | ICD-10-CM | POA: Diagnosis not present

## 2017-07-07 DIAGNOSIS — H409 Unspecified glaucoma: Secondary | ICD-10-CM | POA: Diagnosis not present

## 2017-07-07 DIAGNOSIS — D649 Anemia, unspecified: Secondary | ICD-10-CM | POA: Diagnosis not present

## 2017-07-07 DIAGNOSIS — M1A0790 Idiopathic chronic gout, unspecified ankle and foot, without tophus (tophi): Secondary | ICD-10-CM | POA: Diagnosis not present

## 2017-07-07 DIAGNOSIS — Z7982 Long term (current) use of aspirin: Secondary | ICD-10-CM | POA: Diagnosis not present

## 2017-07-08 ENCOUNTER — Other Ambulatory Visit: Payer: Self-pay | Admitting: Cardiovascular Disease

## 2017-07-09 NOTE — Progress Notes (Signed)
Cardiology Office Note  Date:  07/10/2017   ID:  Ryn Shoffner-Simmons, DOB [birth year], MRN [id number]  PCP:  Tegeler, Loma Sousa, MD   Chief Complaint  Patient presents with  . other    6 month follow up. "doing well."     HPI:  [name] is a pleasant 84 year old woman with a history of  diabetes,  hypertension,  SVT, Moderate aortic valve stenosis  hospital admission July 2016 for SVT after she developed numerous bug bites with associated pain. 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  She had TAVR September 2018  recovered well,  Patient still lives independently with significant help from family Prior history of falls On prior office visit had fallen the bathroom  Good appetite, able to walk short distances No leg swelling or abdominal bloating Difficulty sleeping, napping in the daytime Denies chest pain No tachycardia  chronic back pain, labile hypertension,   In the past with poor appetite and low albumin Family feels blood pressure may be running low, did not bring numbers with him today  EKG personally reviewed by myself on todays visit Shows normal sinus rhythm rate 63 bpm left bundle branch block  Other past medical history reviewed Previous hospitalization end of January with discharge 04/01/2016 She had sepsis, enterococcus , had TEE done in the hospital to rule out endocarditis Moderate aortic valve stenosis noted  emergency room 04/12/16  fatigue, shortness breath Felt it was from anemia, deconditioning. Was not admitted to the hospital  Family chose to bring her home, did not go to skilled nursing facility  Daughter concerned as blood pressures running low She has been holding doses of hydralazine, still running low HCTZ previously held for renal dysfunction  hospital admission July 2016,She reports that she had a rash at the time. Turns  out she went walking in her yard, developed insect bites all of her legs and abdomen (chiger bits). She was in a tremendous amount of discomfort with severe itching. Likely secondary to that, she developed arrhythmia. she converted back to normal sinus rhythm in the hospital. At the time of discharge her metoprolol was increased from 50 up to 75 mg daily. She is felt well with no further episodes of tachycardia. She currently lives at home, independent  admitted to the hospital 09/20/2012 discharged on 09/25/2012 for Klebsiella UTI, shortness breath, tachycardia, SVT. Arrival to the hospital, she was very weak and was unable to swallow. She was felt to be very dehydrated. Her potassium was repleted, she was started on metoprolol and Cardizem.  Echocardiogram in the hospital 09/19/2012 showed ejection fraction 42-59%, diastolic dysfunction, mild LVH, moderate aortic valve stenosis, normal RV function and size  CT scan of the neck showed enlargement of the thyroid with multinodular appearance, atherosclerotic disease, right pleural effusion Lab work in the hospital 09/11/2012 showing creatinine 1.57, BUN 44 Followup blood work showed creatinine 1.07, BUN 17 on July 23  PMH:   has a past medical history of Anemia, Carotid arterial disease (HCC), Cervical spine arthritis, Chronic diastolic CHF (congestive heart failure) ([place]), Cognitive impairment, Depression, Diabetes mellitus without complication ([place]), Essential hypertension, GERD (gastroesophageal reflux disease), Glaucoma, Gout, History of renal impairment, Hyperlipidemia, Hypotension, Multinodular goiter, Paroxysmal SVT (supraventricular tachycardia) ([place]), Prolapsed uterus, Severe aortic stenosis, Stroke ([place]), and Vitamin deficiency.  PSH:   who Past Surgical History:  Procedure Laterality Date  . EYE SURGERY     unsure of what procedure, did  know that laser was used  . IR RADIOLOGY PERIPHERAL GUIDED IV START  11/03/2016  . IR US GUIDE  VASC ACCESS RIGHT  11/03/2016  . RIGHT/LEFT HEART CATH AND CORONARY ANGIOGRAPHY N/A 10/26/2016   Procedure: RIGHT/LEFT HEART CATH AND CORONARY ANGIOGRAPHY;  Surgeon: [name], MD;  Location: [facility];  Service: Cardiovascular;  Laterality: N/A;  . TEE WITHOUT CARDIOVERSION N/A 03/31/2016   Procedure: TRANSESOPHAGEAL ECHOCARDIOGRAM (TEE);  Surgeon: Minna Merritts, MD;  Location: [facility];  Service: Cardiovascular;  Laterality: N/A;  . TEE WITHOUT CARDIOVERSION N/A 11/15/2016   Procedure: TRANSESOPHAGEAL ECHOCARDIOGRAM (TEE);  Surgeon: [name], MD;  Location: [facility];  Service: Open Heart Surgery;  Laterality: N/A;  . TRANSCATHETER AORTIC VALVE REPLACEMENT, TRANSFEMORAL N/A 11/15/2016   Procedure: TRANSCATHETER AORTIC VALVE REPLACEMENT, TRANSFEMORAL;  Surgeon: [name], MD;  Location: [facility];  Service: Open Heart Surgery;  Laterality: N/A;  . TUBAL LIGATION      Current Outpatient Medications  Medication Sig Dispense Refill  . ACCU-CHEK AVIVA PLUS test strip USE 1 STRIP TO TEST BLOOD SUGARS 3 TIMES A DAY  12  . acetaminophen (TYLENOL) 500 MG tablet Take 500 mg by mouth every 6 (six) hours as needed for mild pain or headache.    . allopurinol (ZYLOPRIM) 100 MG tablet Take 100 mg by mouth daily.    . Amino Acids-Protein Hydrolys (FEEDING SUPPLEMENT, PRO-STAT SUGAR FREE 64,) LIQD Take 30 mLs by mouth 3 (three) times daily with meals.    Marland Kitchen aspirin 81 MG chewable tablet Chew 81 mg by mouth daily.    . brimonidine (ALPHAGAN P) 0.1 % SOLN Place 1 drop into the right eye 2 (two) times daily.    . cholecalciferol (VITAMIN D) 1000 units tablet Take 1,000 Units by mouth daily.    . Dorzolamide HCl-Timolol Mal (COSOPT OP) Place 1 drop into the right eye 2 (two) times daily.     . feeding supplement, ENSURE ENLIVE, (ENSURE ENLIVE) LIQD Take 237 mLs by mouth 2 (two) times daily between meals. 237 mL 12  . fluticasone (FLONASE) 50 MCG/ACT nasal spray Place 1 spray into both nostrils 2  (two) times daily.  11  . furosemide (LASIX) 20 MG tablet Take 1 tablet (20 mg total) by mouth daily. Take an additional 20mg  if weight >125lbs 60 tablet 2  . gabapentin (NEURONTIN) 300 MG capsule Take 300 mg by mouth 2 (two) times daily.    Marland Kitchen glucose blood test strip Accu-Chek Aviva Plus test strips    . hydrALAZINE (APRESOLINE) 25 MG tablet Take 1 tablet (25 mg total) by mouth 3 (three) times daily. Hold only if systolic blood pressure (top number) is 100 or below 90 tablet 3  . HYDROcodone-acetaminophen (NORCO/VICODIN) 5-325 MG tablet Take 1 tablet by mouth every 6 (six) hours as needed for moderate pain. 30 tablet 0  . iron polysaccharides (NIFEREX) 150 MG capsule Take 150 mg by mouth 2 (two) times daily.    . isosorbide mononitrate (IMDUR) 30 MG 24 hr tablet TAKE 1 TABLET BY MOUTH EVERY DAY 30 tablet 0  . KLOR-CON 10 10 MEQ tablet TAKE 1 TABLET BY MOUTH EVERY DAY TAKE WITH LASIX 30 tablet 3  . latanoprost (XALATAN) 0.005 % ophthalmic solution latanoprost 0.005 % eye drops    . lidocaine (LIDODERM) 5 % Place onto the skin.    Marland Kitchen lisinopril (PRINIVIL,ZESTRIL) 10 MG tablet Take 10 mg by mouth daily. HOLD FOR BLOOD PRESSURE LOWER THAN 90/60    . LORazepam (  ATIVAN) 0.5 MG tablet Take 1 tablet (0.5 mg total) by mouth at bedtime. Take 1 tablet at bedtime as needed for sleep or anxiety (Patient taking differently: Take 0.5-1 mg by mouth See admin instructions. Take 1 mg by mouth at bedtime and may take and additional  0.5mg  during the day as needed for anxiety) 60 tablet 0  . lovastatin (MEVACOR) 40 MG tablet Take 40 mg by mouth at bedtime.    . metoprolol succinate (TOPROL-XL) 25 MG 24 hr tablet Take 25 mg by mouth daily.    . Multiple Vitamin (MULTI-VITAMIN PO) Take 1 tablet by mouth daily.    . pantoprazole (PROTONIX) 40 MG tablet Take 40 mg by mouth daily.    . pilocarpine (PILOCAR) 4 % ophthalmic solution Place 1 drop into the right eye 4 (four) times daily.     . polyethylene glycol (MIRALAX)  packet Take 17 g by mouth daily. 14 each 0  . senna-docusate (SENOKOT-S) 8.6-50 MG tablet Take 2 tablets by mouth daily as needed for mild constipation.     . sertraline (ZOLOFT) 50 MG tablet Take 50 mg by mouth daily.    . traZODone (DESYREL) 100 MG tablet Take 100 mg by mouth at bedtime.    . vitamin C (ASCORBIC ACID) 500 MG tablet Take 500 mg by mouth daily.    . Zinc Oxide 10 % OINT Apply 1 application topically daily as needed (rash). Applied to patient's bottom     No current facility-administered medications for this visit.      Allergies:   Metformin hcl; Metformin; and Metformin and related   Social History:  The patient  reports that she has never smoked. She has never used smokeless tobacco. She reports that she does not drink alcohol or use drugs.   Family History:   family history includes Colitis in her father; Diabetes in her mother; Glaucoma in her mother; Peptic Ulcer in her father.    Review of Systems: Review of Systems  Constitutional:       General malaise  Respiratory: Negative.   Cardiovascular: Negative.   Gastrointestinal: Negative.   Musculoskeletal: Negative.        Leg weakness  Neurological: Positive for weakness.  Psychiatric/Behavioral: Negative.   All other systems reviewed and are negative.    PHYSICAL EXAM: VS:  BP 118/62 (BP Location: Right Arm, Patient Position: Sitting, Cuff Size: Normal)   Pulse 63   Ht 5' (1.524 m)   Wt 152 lb (68.9 kg)   BMI 29.69 kg/m  , BMI Body mass index is 29.69 kg/m. Constitutional:  oriented to person, place, and time. No distress.  Presenting in a wheelchair HENT:  Head: Normocephalic and atraumatic.  Eyes:  no discharge. No scleral icterus.  Neck: Normal range of motion. Neck supple. No JVD present.  Cardiovascular: Normal rate, regular rhythm, normal heart sounds and intact distal pulses. Exam reveals no gallop and no friction rub. No edema 1-2/6 SEM RSB Pulmonary/Chest: Effort normal and breath sounds  normal. No stridor. No respiratory distress.  no wheezes.  no rales.  no tenderness.  Abdominal: Soft.  no distension.  no tenderness.  Musculoskeletal: Normal range of motion.  no  tenderness or deformity.  Neurological:  normal muscle tone. Coordination normal. No atrophy Skin: Skin is warm and dry. No rash noted. not diaphoretic.  Psychiatric:  normal mood and affect. behavior is normal. Thought content normal.    Recent Labs: 08/30/2016: TSH 0.689 09/15/2016: B Natriuretic Peptide 313.0 11/16/2016: Magnesium  1.5 04/02/2017: ALT 25; BUN 47; Creatinine, Ser 0.75; Hemoglobin 12.0; Platelets 201; Potassium 4.2; Sodium 135    Lipid Panel Lab Results  Component Value Date   CHOL 114 09/11/2014   HDL 40 (L) 09/11/2014   LDLCALC 55 09/11/2014   TRIG 97 09/11/2014      Wt Readings from Last 3 Encounters:  07/10/17 152 lb (68.9 kg)  04/11/17 137 lb 9.6 oz (62.4 kg)  04/02/17 138 lb (62.6 kg)       ASSESSMENT AND PLAN:  SVT (supraventricular tachycardia) (HCC) Denies having any significant arrhythmia stable  Essential hypertension, malignant Family concerned about low blood pressure at home Recommend we hold hydralazine and monitor her blood pressures He will call us with numbers for further review  Chronic diastolic CHF (congestive heart failure) (HCC) Appears euvolemic,  On Lasix daily  Aortic valve stenosis, s/p TAVR  Doing well, Will need periodic echocardiogram  SOB (shortness of breath) Denies any symptoms of shortness of breath No further workup at this time Minimally active, walking short distances  Diabetes mellitus without complication ([place]) Management per primary care Weight is stable, eating well at times Family concerned about periodic low sugars Not on any diabetes medications  Acute midline low back pain, with sciatica presence unspecified Chronic back pain, stable   Disposition:   F/U  12 months   Total encounter time more than 25 minutes   Greater than 50% was spent in counseling and coordination of care with the patient    Orders Placed This Encounter  Procedures  . EKG 12-Lead     Signed, Esmond Plants, M.D., Ph.D. 07/10/2017  [place], [place]

## 2017-07-10 ENCOUNTER — Ambulatory Visit (INDEPENDENT_AMBULATORY_CARE_PROVIDER_SITE_OTHER): Payer: PPO | Admitting: Cardiovascular Disease

## 2017-07-10 ENCOUNTER — Encounter: Payer: Self-pay | Admitting: Cardiovascular Disease

## 2017-07-10 VITALS — BP 118/62 | HR 63 | Ht 60.0 in | Wt 152.0 lb

## 2017-07-10 DIAGNOSIS — I471 Supraventricular tachycardia: Secondary | ICD-10-CM | POA: Diagnosis not present

## 2017-07-10 DIAGNOSIS — I10 Essential (primary) hypertension: Secondary | ICD-10-CM | POA: Diagnosis not present

## 2017-07-10 DIAGNOSIS — I5032 Chronic diastolic (congestive) heart failure: Secondary | ICD-10-CM

## 2017-07-10 DIAGNOSIS — I6523 Occlusion and stenosis of bilateral carotid arteries: Secondary | ICD-10-CM | POA: Diagnosis not present

## 2017-07-10 DIAGNOSIS — I350 Nonrheumatic aortic (valve) stenosis: Secondary | ICD-10-CM | POA: Diagnosis not present

## 2017-07-10 DIAGNOSIS — E1159 Type 2 diabetes mellitus with other circulatory complications: Secondary | ICD-10-CM | POA: Diagnosis not present

## 2017-07-10 MED ORDER — CETIRIZINE HCL 10 MG PO TABS: ORAL_TABLET | ORAL | Status: DC

## 2017-07-10 NOTE — Patient Instructions (Addendum)
Medication Instructions: - Your physician has recommended you make the following change in your medication:   1) STOP hydralazine 2) You can take Zyrtec (cetirizine) 10 mg- take 1/2 tablet (5 mg) by mouth once daily  (you can get this over the counter)  Labwork: - none ordered  Procedures/Testing: - none ordered  Follow-Up: - Your physician wants you to follow-up in: 1 year with Dr. Rockey Situ. You will receive a reminder letter in the mail two months in advance. If you don't receive a letter, please call our office to schedule the follow-up appointment.   Any Additional Special Instructions Will Be Listed Below (If Applicable). - monitor your blood pressure readings at home    If you need a refill on your cardiac medications before your next appointment, please call your pharmacy.

## 2017-07-11 ENCOUNTER — Other Ambulatory Visit: Payer: Self-pay | Admitting: Cardiovascular Disease

## 2017-08-01 ENCOUNTER — Other Ambulatory Visit: Payer: Self-pay | Admitting: Cardiovascular Disease

## 2017-08-18 NOTE — Telephone Encounter (Signed)
Error

## 2017-08-21 ENCOUNTER — Telehealth: Payer: Self-pay | Admitting: Cardiovascular Disease

## 2017-08-21 NOTE — Telephone Encounter (Signed)
Looks like bp's were low @ home while on hydral 25 TID.  She may just be very sensitive and as a result I wonder if she'd do better w/ 10 mg TID instead of 25 TID.  They are welcome to try the 25 TID since they have that @ home, but if BP < 120, they should contact us b/c she might need the lower dose.

## 2017-08-21 NOTE — Telephone Encounter (Signed)
I called and spoke with the patient's daughter, [name] (ok per DPR). She reports that the patient's blood pressure has slowly been trending up since Dr. Rockey Situ stopped her hydralazine 25 mg TID on 07/10/17.  [name] reports a reading of 140/85 & then today's readings are 173/108 (79) before meds  & 174/98 3 hours after her medications.   I have confirmed with [name] that the patient is currently taking:  1) lasix 20 mg once daily 2) imdur 30 mg once daily 3) lisinopril 10 mg once daily 4) metoprolol succ 25 mg once daily  She still has hydralazine 50 mg tablets at home as she was cutting them in 1/2 prior to the medication being stopped. I advised I would forward to the MD/ APP covering for Dr. Rockey Situ to review and advise and we will call her back with further recommendations.  [name] is agreeable.  The patient is currently having no symptoms, but they are just concerned about the numbers going up.

## 2017-08-21 NOTE — Telephone Encounter (Signed)
Patient daughter calling  Patient daughter states the patients blood pressure had been staying regular until recently It has been getting low and would like to know if she should take hydralazine medication Please call to discuss

## 2017-08-22 NOTE — Telephone Encounter (Signed)
Left voicemail message to call back  

## 2017-08-22 NOTE — Telephone Encounter (Signed)
Spoke with patients daughter per release form and reviewed providers recommendations. She verbalized understanding and stated that BP today was 140's/100's. Instructed her to try the Hydralazine 50 mg 1/2 tablet (25 mg total) three times daily and to please call us back if blood pressures run <120. She verbalized understanding of our conversation, agreement with plan, and had no further questions at this time.

## 2017-08-24 ENCOUNTER — Telehealth: Payer: Self-pay | Admitting: Cardiovascular Disease

## 2017-08-24 MED ORDER — HYDRALAZINE HCL 10 MG PO TABS: 10.0000 mg | ORAL_TABLET | Freq: Three times a day (TID) | ORAL | 3 refills | Status: DC

## 2017-08-24 NOTE — Addendum Note (Signed)
Addended by: Alvis Lemmings C on: 08/24/2017 12:59 PM   Modules accepted: Orders

## 2017-08-24 NOTE — Telephone Encounter (Signed)
I called and spoke with [name]. She is aware that I have sent in hydralazine 10 mg - take 1 tablet by mouth TID to the [facility] in [place].  I have advised that they continue to monitor the patient's BP readings and call us back if they are running on the low side.  [name] verbalizes understanding.

## 2017-08-24 NOTE — Telephone Encounter (Signed)
Error

## 2017-08-24 NOTE — Telephone Encounter (Signed)
Pt daughter calling back stating pt BP this morning was  111/91 (before medications) She states if we need to call in the 10 mg instead of the 25 mg of Hydralazine we may   [facility] to [facility] [place]   Please advise

## 2017-09-04 ENCOUNTER — Telehealth: Payer: Self-pay | Admitting: Cardiovascular Disease

## 2017-09-04 DIAGNOSIS — I10 Essential (primary) hypertension: Secondary | ICD-10-CM | POA: Diagnosis not present

## 2017-09-04 DIAGNOSIS — E876 Hypokalemia: Secondary | ICD-10-CM | POA: Diagnosis not present

## 2017-09-04 DIAGNOSIS — N289 Disorder of kidney and ureter, unspecified: Secondary | ICD-10-CM | POA: Diagnosis not present

## 2017-09-04 DIAGNOSIS — F1320 Sedative, hypnotic or anxiolytic dependence, uncomplicated: Secondary | ICD-10-CM | POA: Diagnosis not present

## 2017-09-04 DIAGNOSIS — I6381 Other cerebral infarction due to occlusion or stenosis of small artery: Secondary | ICD-10-CM | POA: Diagnosis not present

## 2017-09-04 DIAGNOSIS — I5040 Unspecified combined systolic (congestive) and diastolic (congestive) heart failure: Secondary | ICD-10-CM | POA: Diagnosis not present

## 2017-09-04 DIAGNOSIS — E1142 Type 2 diabetes mellitus with diabetic polyneuropathy: Secondary | ICD-10-CM | POA: Diagnosis not present

## 2017-09-04 DIAGNOSIS — I471 Supraventricular tachycardia: Secondary | ICD-10-CM | POA: Diagnosis not present

## 2017-09-04 DIAGNOSIS — R600 Localized edema: Secondary | ICD-10-CM | POA: Diagnosis not present

## 2017-09-04 DIAGNOSIS — I350 Nonrheumatic aortic (valve) stenosis: Secondary | ICD-10-CM | POA: Diagnosis not present

## 2017-09-04 DIAGNOSIS — E785 Hyperlipidemia, unspecified: Secondary | ICD-10-CM | POA: Diagnosis not present

## 2017-09-04 DIAGNOSIS — E1169 Type 2 diabetes mellitus with other specified complication: Secondary | ICD-10-CM | POA: Diagnosis not present

## 2017-09-04 NOTE — Telephone Encounter (Signed)
Pt daughter calling stating they just left PCP Dr Jabier Mutton asked her to call us Patient has some swelling and edema and she is hearing a "third" sound in heart beat   The patient are feet and ankles are swollen They noticed it within the last week.  Patient is on fluid pills, and taking it daily.   Wanted to know if patient needed to be seen or if there is something we can do to check this out.    163.64 was her weight today.  They are not sure why but the pas year over all she's been gradually gaining weight

## 2017-09-05 NOTE — Telephone Encounter (Signed)
Spoke with patients daughter per release form. She states that patients PCP had concerns about her increased weight, fluid, and swelling. She states that they instructed her to call us for further evaluation. Advised that she would need to wear compression hose/socks and keep legs elevated when possible. Reviewed importance of those when they travel as well. She wanted to see if Dr. Rockey Situ could see her today and advised that we don't have any openings. Scheduled her to come in on Friday to see provider for further evaluation. She verbalized understanding with no further questions at this time.

## 2017-09-06 NOTE — Progress Notes (Signed)
Cardiology Office Note  Date:  09/08/2017   ID:  [name], DOB 06/19/1924, MRN [id number]  PCP:  Tegeler, Loma Sousa, MD   Chief Complaint  Patient presents with  . Other    Per phone note patient is having fluid overload. Patient c.o swelling in feet, ankles, and legs. Meds reviewed verbally with patient.     HPI:  [name] is a pleasant 84 year old woman with a history of  diabetes,  hypertension,  SVT, Moderate aortic valve stenosis  hospital admission July 2016 for SVT after she developed numerous bug bites with associated pain. She presents for follow-up of her TAVR and lower extremity edema  Prior history of arrhythmia, hypertension, elevated glucose levels, general malaise and anorexia/weight loss, s/p TAVR 10/2016  Pressures up and down per the family Have to periodically hold hydralazine for low blood pressure Yesterday 113/60 in the afternoon and hydralazine held  Problems with insomnia lorazepam makes her too sleepy Trazodone on her list but has not tried this recently Melatonin last night 10 mg seem to work okay but did not last all over through the night Denies napping in the daytime. Previously reported she was sleeping in the day  Over the past week has developed lower extremity swelling Given extra Lasix for twice a day dosing and swelling resolved Normal on today's visit  Not very active at baseline, presents in a wheelchair   TAVR September 2018  Prior history of falls Done recently Prior fall in the bathroom  Good appetite Denies chest pain No tachycardia  chronic back pain  In the past with poor appetite and low albumin  EKG personally reviewed by myself on todays visit Shows normal sinus rhythm rate 67 bpm left bundle branch block  Other past medical history reviewed Previous hospitalization end of January with discharge 04/01/2016 She had sepsis, enterococcus , had TEE done in the hospital to rule out  endocarditis Moderate aortic valve stenosis noted  emergency room 04/12/16  fatigue, shortness breath Felt it was from anemia, deconditioning. Was not admitted to the hospital  hospital admission July 2016,She reports that she had a rash at the time. Turns out she went walking in her yard, developed insect bites all of her legs and abdomen (chiger bits). She was in a tremendous amount of discomfort with severe itching. Likely secondary to that, she developed arrhythmia. she converted back to normal sinus rhythm in the hospital. At the time of discharge her metoprolol was increased from 50 up to 75 mg daily. She is felt well with no further episodes of tachycardia. She currently lives at home, independent  admitted to the hospital 09/20/2012 discharged on 09/25/2012 for Klebsiella UTI, shortness breath, tachycardia, SVT. Arrival to the hospital, she was very weak and was unable to swallow. She was felt to be very dehydrated. Her potassium was repleted, she was started on metoprolol and Cardizem.  Echocardiogram in the hospital 09/19/2012 showed ejection fraction 25-36%, diastolic dysfunction, mild LVH, moderate aortic valve stenosis, normal RV function and size  CT scan of the neck showed enlargement of the thyroid with multinodular appearance, atherosclerotic disease, right pleural effusion Lab work in the hospital 09/11/2012 showing creatinine 1.57, BUN 44 Followup blood work showed creatinine 1.07, BUN 17 on July 23  PMH:   has a past medical history of Anemia, Carotid arterial disease (HCC), Cervical spine arthritis, Chronic diastolic CHF (congestive heart failure) ([place]), Cognitive impairment, Depression, Diabetes mellitus without complication ([place]), Essential hypertension, GERD (gastroesophageal reflux disease), Glaucoma, Gout, History  of renal impairment, Hyperlipidemia, Hypotension, Multinodular goiter, Paroxysmal SVT (supraventricular tachycardia) (HCC), Prolapsed uterus, Severe  aortic stenosis, Stroke ([place]), and Vitamin deficiency.  PSH:   who Past Surgical History:  Procedure Laterality Date  . EYE SURGERY     unsure of what procedure, did know that laser was used  . IR RADIOLOGY PERIPHERAL GUIDED IV START  11/03/2016  . IR US GUIDE VASC ACCESS RIGHT  11/03/2016  . RIGHT/LEFT HEART CATH AND CORONARY ANGIOGRAPHY N/A 10/26/2016   Procedure: RIGHT/LEFT HEART CATH AND CORONARY ANGIOGRAPHY;  Surgeon: [name], MD;  Location: [facility];  Service: Cardiovascular;  Laterality: N/A;  . TEE WITHOUT CARDIOVERSION N/A 03/31/2016   Procedure: TRANSESOPHAGEAL ECHOCARDIOGRAM (TEE);  Surgeon: Minna Merritts, MD;  Location: [facility];  Service: Cardiovascular;  Laterality: N/A;  . TEE WITHOUT CARDIOVERSION N/A 11/15/2016   Procedure: TRANSESOPHAGEAL ECHOCARDIOGRAM (TEE);  Surgeon: [name], MD;  Location: [facility];  Service: Open Heart Surgery;  Laterality: N/A;  . TRANSCATHETER AORTIC VALVE REPLACEMENT, TRANSFEMORAL N/A 11/15/2016   Procedure: TRANSCATHETER AORTIC VALVE REPLACEMENT, TRANSFEMORAL;  Surgeon: [name], MD;  Location: [facility];  Service: Open Heart Surgery;  Laterality: N/A;  . TUBAL LIGATION      Current Outpatient Medications  Medication Sig Dispense Refill  . ACCU-CHEK AVIVA PLUS test strip USE 1 STRIP TO TEST BLOOD SUGARS 3 TIMES A DAY  12  . acetaminophen (TYLENOL) 500 MG tablet Take 500 mg by mouth every 6 (six) hours as needed for mild pain or headache.    . allopurinol (ZYLOPRIM) 100 MG tablet Take 100 mg by mouth daily.    . Amino Acids-Protein Hydrolys (FEEDING SUPPLEMENT, PRO-STAT SUGAR FREE 64,) LIQD Take 30 mLs by mouth 3 (three) times daily with meals.    Marland Kitchen aspirin 81 MG chewable tablet Chew 81 mg by mouth daily.    . brimonidine (ALPHAGAN P) 0.1 % SOLN Place 1 drop into the right eye 2 (two) times daily.    . cetirizine (ZYRTEC) 10 MG tablet Take 1/2 tablet (5 mg) by mouth once daily for allergies    . cholecalciferol (VITAMIN D)  1000 units tablet Take 1,000 Units by mouth daily.    . Dorzolamide HCl-Timolol Mal (COSOPT OP) Place 1 drop into the right eye 2 (two) times daily.     . feeding supplement, ENSURE ENLIVE, (ENSURE ENLIVE) LIQD Take 237 mLs by mouth 2 (two) times daily between meals. 237 mL 12  . fluticasone (FLONASE) 50 MCG/ACT nasal spray Place 1 spray into both nostrils 2 (two) times daily.  11  . furosemide (LASIX) 20 MG tablet Take 1 tablet (20 mg total) by mouth daily. Take an additional 20mg  if weight >125lbs 60 tablet 2  . gabapentin (NEURONTIN) 300 MG capsule Take 300 mg by mouth 2 (two) times daily.    Marland Kitchen glucose blood test strip Accu-Chek Aviva Plus test strips    . hydrALAZINE (APRESOLINE) 10 MG tablet Take 1 tablet (10 mg total) by mouth 3 (three) times daily. 90 tablet 3  . HYDROcodone-acetaminophen (NORCO/VICODIN) 5-325 MG tablet Take 1 tablet by mouth every 6 (six) hours as needed for moderate pain. 30 tablet 0  . iron polysaccharides (NIFEREX) 150 MG capsule Take 150 mg by mouth 2 (two) times daily.    . isosorbide mononitrate (IMDUR) 30 MG 24 hr tablet TAKE 1 TABLET BY MOUTH EVERY DAY 30 tablet 3  . KLOR-CON 10 10 MEQ tablet TAKE 1 TABLET BY MOUTH EVERY DAY TAKE WITH LASIX  30 tablet 3  . latanoprost (XALATAN) 0.005 % ophthalmic solution latanoprost 0.005 % eye drops    . lidocaine (LIDODERM) 5 % Place onto the skin.    Marland Kitchen lisinopril (PRINIVIL,ZESTRIL) 10 MG tablet Take 10 mg by mouth daily. HOLD FOR BLOOD PRESSURE LOWER THAN 90/60    . lovastatin (MEVACOR) 40 MG tablet Take 40 mg by mouth at bedtime.    . metoprolol succinate (TOPROL-XL) 25 MG 24 hr tablet Take 25 mg by mouth daily.    . Multiple Vitamin (MULTI-VITAMIN PO) Take 1 tablet by mouth daily.    . pantoprazole (PROTONIX) 40 MG tablet Take 40 mg by mouth daily.    . pilocarpine (PILOCAR) 4 % ophthalmic solution Place 1 drop into the right eye 4 (four) times daily.     . polyethylene glycol (MIRALAX) packet Take 17 g by mouth daily. 14  each 0  . senna-docusate (SENOKOT-S) 8.6-50 MG tablet Take 2 tablets by mouth daily as needed for mild constipation.     . sertraline (ZOLOFT) 50 MG tablet Take 50 mg by mouth daily.    . traZODone (DESYREL) 100 MG tablet Take 100 mg by mouth at bedtime.    . vitamin C (ASCORBIC ACID) 500 MG tablet Take 500 mg by mouth daily.    . Zinc Oxide 10 % OINT Apply 1 application topically daily as needed (rash). Applied to patient's bottom     No current facility-administered medications for this visit.      Allergies:   Metformin hcl; Metformin; and Metformin and related   Social History:  The patient  reports that she has never smoked. She has never used smokeless tobacco. She reports that she does not drink alcohol or use drugs.   Family History:   family history includes Colitis in her father; Diabetes in her mother; Glaucoma in her mother; Peptic Ulcer in her father.    Review of Systems: Review of Systems  Constitutional:       General malaise  Respiratory: Negative.   Cardiovascular: Positive for leg swelling.  Gastrointestinal: Negative.   Musculoskeletal: Negative.        Leg weakness  Neurological: Positive for weakness.  Psychiatric/Behavioral: Negative.   All other systems reviewed and are negative.    PHYSICAL EXAM: VS:  BP (!) 160/84 (BP Location: Right Arm, Patient Position: Sitting, Cuff Size: Normal)   Pulse 67   Ht 5\' 4"  (1.626 m)   Wt 157 lb 8 oz (71.4 kg)   BMI 27.03 kg/m  , BMI Body mass index is 27.03 kg/m.  No significant change in exam Constitutional:  oriented to person, place, and time. No distress.  Presenting in a wheelchair HENT:  Head: Normocephalic and atraumatic.  Eyes:  no discharge. No scleral icterus.  Neck: Normal range of motion. Neck supple. No JVD present.  Cardiovascular: Normal rate, regular rhythm, normal heart sounds and intact distal pulses. Exam reveals no gallop and no friction rub. No edema 1-2/6 SEM RSB Pulmonary/Chest: Effort  normal and breath sounds normal. No stridor. No respiratory distress.  no wheezes.  no rales.  no tenderness.  Abdominal: Soft.  no distension.  no tenderness.  Musculoskeletal: Normal range of motion.  no  tenderness or deformity.  Neurological:  normal muscle tone. Coordination normal. No atrophy Skin: Skin is warm and dry. No rash noted. not diaphoretic.  Psychiatric:  normal mood and affect. behavior is normal. Thought content normal.    Recent Labs: 09/15/2016: B Natriuretic Peptide 313.0 11/16/2016:  Magnesium 1.5 04/02/2017: ALT 25; BUN 47; Creatinine, Ser 0.75; Hemoglobin 12.0; Platelets 201; Potassium 4.2; Sodium 135    Lipid Panel Lab Results  Component Value Date   CHOL 114 09/11/2014   HDL 40 (L) 09/11/2014   LDLCALC 55 09/11/2014   TRIG 97 09/11/2014      Wt Readings from Last 3 Encounters:  09/08/17 157 lb 8 oz (71.4 kg)  07/10/17 152 lb (68.9 kg)  04/11/17 137 lb 9.6 oz (62.4 kg)       ASSESSMENT AND PLAN:  SVT (supraventricular tachycardia) ([place]) Denies having any significant arrhythmia Stable. no further workup needed.  Essential hypertension, malignant Presented well controlled Family periodically holding hydralazine for low blood pressure Recommend we hold hydralazine for systolic pressure less than 825 and diastolic pressure less than 65  Chronic diastolic CHF (congestive heart failure) (HCC) Recent lower extremity edema resolved with extra Lasix Recommended Lasix daily with extra Lasix for any significant ankle swelling  Aortic valve stenosis, s/p TAVR  Echocardiogram in follow-up visit  SOB (shortness of breath) Minimally active, walking short distances Sedentary. Appears euvolemic  Diabetes mellitus without complication ([place]) Management per primary care Family reports good appetite  Acute midline low back pain, with sciatica presence unspecified Chronic back pain, stable Nonactive issue on today's visit   Disposition:   F/U  12  months   Total encounter time more than 25 minutes  Greater than 50% was spent in counseling and coordination of care with the patient    Orders Placed This Encounter  Procedures  . EKG 12-Lead     Signed, Esmond Plants, M.D., Ph.D. 09/08/2017  [place], [place]

## 2017-09-08 ENCOUNTER — Encounter: Payer: Self-pay | Admitting: Cardiovascular Disease

## 2017-09-08 ENCOUNTER — Ambulatory Visit: Payer: PPO | Admitting: Cardiovascular Disease

## 2017-09-08 ENCOUNTER — Encounter

## 2017-09-08 VITALS — BP 160/84 | HR 67 | Ht 64.0 in | Wt 157.5 lb

## 2017-09-08 DIAGNOSIS — I5032 Chronic diastolic (congestive) heart failure: Secondary | ICD-10-CM | POA: Diagnosis not present

## 2017-09-08 DIAGNOSIS — I471 Supraventricular tachycardia: Secondary | ICD-10-CM | POA: Diagnosis not present

## 2017-09-08 DIAGNOSIS — I6523 Occlusion and stenosis of bilateral carotid arteries: Secondary | ICD-10-CM

## 2017-09-08 DIAGNOSIS — I10 Essential (primary) hypertension: Secondary | ICD-10-CM | POA: Diagnosis not present

## 2017-09-08 DIAGNOSIS — Z952 Presence of prosthetic heart valve: Secondary | ICD-10-CM

## 2017-09-08 DIAGNOSIS — E1159 Type 2 diabetes mellitus with other circulatory complications: Secondary | ICD-10-CM

## 2017-09-08 DIAGNOSIS — E7800 Pure hypercholesterolemia, unspecified: Secondary | ICD-10-CM

## 2017-09-08 DIAGNOSIS — I350 Nonrheumatic aortic (valve) stenosis: Secondary | ICD-10-CM | POA: Diagnosis not present

## 2017-09-08 NOTE — Patient Instructions (Signed)
Medication Instructions:    Please hold the hydralazine for systolic pressure <924 And for Diastolic pressure <46  Labwork:  No new labs needed  Testing/Procedures:  No further testing at this time   Follow-Up: It was a pleasure seeing you in the office today. Please call us if you have new issues that need to be addressed before your next appt.  [phone]  Your physician wants you to follow-up in: 12 months.  You will receive a reminder letter in the mail two months in advance. If you don't receive a letter, please call our office to schedule the follow-up appointment.  If you need a refill on your cardiac medications before your next appointment, please call your pharmacy.  For educational health videos Log in to : [email or web address] Or : [email or web address], password : [email or web address]

## 2017-09-15 DIAGNOSIS — E119 Type 2 diabetes mellitus without complications: Secondary | ICD-10-CM | POA: Diagnosis not present

## 2017-09-15 DIAGNOSIS — H04123 Dry eye syndrome of bilateral lacrimal glands: Secondary | ICD-10-CM | POA: Diagnosis not present

## 2017-09-15 DIAGNOSIS — H401132 Primary open-angle glaucoma, bilateral, moderate stage: Secondary | ICD-10-CM | POA: Diagnosis not present

## 2017-09-15 DIAGNOSIS — H35373 Puckering of macula, bilateral: Secondary | ICD-10-CM | POA: Diagnosis not present

## 2017-09-18 ENCOUNTER — Telehealth: Payer: Self-pay | Admitting: Cardiovascular Disease

## 2017-09-18 NOTE — Telephone Encounter (Signed)
To Dr. Gollan to review.  

## 2017-09-18 NOTE — Telephone Encounter (Signed)
Patient daughter calling States that patient has started taking hydrALAZINE 10 MG but then her blood pressure gets too low and they have to stop the medication for a day then when it gets too high they give the medication Patient daughter would like to know if it is possible to change dosage to 5 MG so they do not have to keep going back and forth between taking and not taking  Please call to discuss

## 2017-09-19 NOTE — Telephone Encounter (Signed)
They can cut the hydralazine to 5 I don't think they make it in the 5  they will have to cut the pill

## 2017-09-20 NOTE — Telephone Encounter (Signed)
Left a message to call back.

## 2017-09-21 ENCOUNTER — Other Ambulatory Visit: Payer: Self-pay

## 2017-09-21 DIAGNOSIS — I350 Nonrheumatic aortic (valve) stenosis: Secondary | ICD-10-CM

## 2017-09-21 DIAGNOSIS — Z952 Presence of prosthetic heart valve: Secondary | ICD-10-CM

## 2017-09-21 MED ORDER — HYDRALAZINE HCL 10 MG PO TABS: 5.0000 mg | ORAL_TABLET | Freq: Three times a day (TID) | ORAL | 3 refills | Status: DC

## 2017-09-21 NOTE — Telephone Encounter (Signed)
S/w daughter and she verbalized understanding to take hydralazine 5 mg by mouth 3 times a day. She will cut the pill in half.   Patient sleeps in the recliner. Daughter noticed last night that patient had an occasional dry cough. Also, as she was bathing her mother, she was taking more shallow breaths. Patient still gets up to the bathroom with assistance. Patient had swelling on Sunday to Monday and patient received extra lasix. She does still have a trace amount of swelling on feet/ankles. Advised her to give an extra lasix this afternoon and to call back if breathing or cough worsens. Also, to make sure patient is doing turn, coughing and deep breathing If patient's cough or breathing are severe to go to the ER and she verbalized understanding.

## 2017-09-21 NOTE — Telephone Encounter (Signed)
Pt returning our call  ° °

## 2017-10-05 ENCOUNTER — Ambulatory Visit (INDEPENDENT_AMBULATORY_CARE_PROVIDER_SITE_OTHER): Payer: PPO | Admitting: Podiatry

## 2017-10-05 ENCOUNTER — Encounter: Payer: Self-pay | Admitting: Podiatry

## 2017-10-05 ENCOUNTER — Ambulatory Visit: Payer: PPO | Admitting: Podiatry

## 2017-10-05 DIAGNOSIS — D689 Coagulation defect, unspecified: Secondary | ICD-10-CM

## 2017-10-05 DIAGNOSIS — R609 Edema, unspecified: Secondary | ICD-10-CM

## 2017-10-05 DIAGNOSIS — M79676 Pain in unspecified toe(s): Secondary | ICD-10-CM

## 2017-10-05 DIAGNOSIS — M2041 Other hammer toe(s) (acquired), right foot: Secondary | ICD-10-CM

## 2017-10-05 DIAGNOSIS — E1159 Type 2 diabetes mellitus with other circulatory complications: Secondary | ICD-10-CM | POA: Diagnosis not present

## 2017-10-05 DIAGNOSIS — B351 Tinea unguium: Secondary | ICD-10-CM

## 2017-10-05 NOTE — Progress Notes (Signed)
Complaint:  Visit Type: Patient returns to my office for continued preventative foot care services. Complaint: Patient states" my nails have grown long and thick and become painful to walk and wear shoes" Patient has been diagnosed with DM with no foot complications. The patient presents for preventative foot care services. No changes to ROS.  Patient is taking plavix. Patient has been wearing her diabetic shoes without problems according to her daughter.  Podiatric Exam: Vascular: dorsalis pedis  pulses are weakly palpable bilateral. PT pulses are not palpable  B/L. Capillary return is immediate. Temperature gradient is WNL. Skin turgor WNL  Sensorium: Normal Semmes Weinstein monofilament test. Normal tactile sensation bilaterally. Nail Exam: Pt has thick disfigured discolored nails with subungual debris noted bilateral entire nail hallux through fifth toenails Ulcer Exam: There is no evidence of ulcer or pre-ulcerative changes or infection. Orthopedic Exam: Muscle tone and strength are WNL. No limitations in general ROM. No crepitus or effusions noted. Foot type and digits show no abnormalities. HAV  B/L. Skin: No Porokeratosis. No infection or ulcers  Diagnosis:  Onychomycosis, , Pain in right toe, pain in left toes,  Diabetes with vascular disease. HAV  B/L.  Treatment & Plan Procedures and Treatment: Consent by patient was obtained for treatment procedures.   Debridement of mycotic and hypertrophic toenails, 1 through 5 bilateral and clearing of subungual debris. No ulceration, no infection noted. Compression sock prescribed for swelling. Return Visit-Office Procedure: Patient instructed to return to the office for a follow up visit 3 months for continued evaluation and treatment.    Gardiner Barefoot DPM

## 2017-10-20 ENCOUNTER — Telehealth: Payer: Self-pay

## 2017-10-20 NOTE — Telephone Encounter (Signed)
Called to reschedule 1 year TAVR OV/echo to another week.  Left message to call back.

## 2017-10-23 NOTE — Telephone Encounter (Signed)
TAVR completed 11/15/2016.  Rescheduled 1 year TAVR echo and OV to 11/08/2017. Patient's daughter agrees with treatment plan.

## 2017-10-25 ENCOUNTER — Other Ambulatory Visit: Payer: Self-pay | Admitting: Cardiovascular Disease

## 2017-10-31 ENCOUNTER — Other Ambulatory Visit: Payer: Self-pay | Admitting: Cardiovascular Disease

## 2017-11-06 NOTE — Progress Notes (Signed)
[facility] [place]                                       Cardiology Office Note    Date:  11/08/2017   ID:  [name], DOB [birth year], MRN [id number]  PCP:  Tegeler, Loma Sousa, MD  Cardiologist:  Dr. Rockey Situ / Dr. Burt Knack (TAVR)  CC: 1 year s/p TAVR  History of Present Illness:  [name] is a 84 y.o. female with a history of chronic diastolic CHF, SVT, anemia, non obst CAD, pulm HTN, DMT2, HTN and severe AS with mod AI s/p TAVR on 11/15/16 who presents to clinic for 1 month follow up.    2D ECHO 02/2016 showed severe AS with mild-mod AI. AVA 0.94, mean gradient 60 mm Hg and peak gradient 97 mm Hg. She has experienced progressive dyspnea with exertion, NYHA Functional Class III symptoms of chronic diastolic heart failure. L/RHC in 09/2016 showed non obst CAD with severe R PDA stenosis appropriate for medical therapy, severe pulmonary HTN, and severe AS.  She underwent successful TAVR with a 34mm Edwards Sapien 3 THV via R TF approach on 11/15/16. ECG with new LBBB but no high grade block. Post op course was uncomplicated. She was discharged on ASA and plavix. 1 month echo showed slightly lower EF (40-45%) with LV dyssynchrony from LBBB, but TAVR valve functioning normally with mild regurgitation.   Today she presents to clinic for follow up. She is doing well. Here with her daughter [name]. She has noticed a lot of improvement since her valve surgery. She has some intermittent LE edema for which she takes extra lasix. Directions on bottle say to take extra if weight >125. She has gained a lot of weight recently 2/2 to eating cookies and ice cream. Her new baseline weight is around 163 lbs. No CP or SOB. No LE edema today, no orthopnea or PND. No dizziness or syncope. No blood in stool or urine. No palpitations. She is not very active and mostly sits in her chair and visits with family and watches TV. She sometimes gets up to  roam around the house.   Past Medical History:  Diagnosis Date  . Anemia   . Carotid arterial disease ([place])    a. 08/2014 U/S: Bilateral < 50% stenosis. Patent vertebrals w/ antegrade flow.   . Cervical spine arthritis   . Chronic diastolic CHF (congestive heart failure) ([place])    a. echo 2014: EF 55-60%, DD;  b. 08/2014 Echo: EF 55-60%, no RWMA, GR1DD; c. 02/2016 Echo: EF 65-70%, Gr1 DD.  Marland Kitchen Cognitive impairment   . Depression   . Diabetes mellitus without complication ([place])   . Essential hypertension   . GERD (gastroesophageal reflux disease)   . Glaucoma   . Gout   . History of renal impairment   . Hyperlipidemia   . Hypotension    a. Related to Norvasc - 11/2014 ED visit.  . Multinodular goiter    a. Noted incidentally on CT 07/2012 and carotid U/S 08/2014;  b. Nl TSH 11/2014.  Marland Kitchen Paroxysmal SVT (supraventricular tachycardia) (H[facility])    a. on Toprol   . Prolapsed uterus   . Severe aortic stenosis    a. 11/15/16: s/p TAVR Edwards Sapien 3 THV (size 23 mm, model # 9600TFX, serial # [id number])  . Stroke [facility])    a. CT head  07/2014 showed small thalamic infarct  . Vitamin deficiency     Past Surgical History:  Procedure Laterality Date  . EYE SURGERY     unsure of what procedure, did know that laser was used  . IR RADIOLOGY PERIPHERAL GUIDED IV START  11/03/2016  . IR US GUIDE VASC ACCESS RIGHT  11/03/2016  . RIGHT/LEFT HEART CATH AND CORONARY ANGIOGRAPHY N/A 10/26/2016   Procedure: RIGHT/LEFT HEART CATH AND CORONARY ANGIOGRAPHY;  Surgeon: [name], MD;  Location: [facility];  Service: Cardiovascular;  Laterality: N/A;  . TEE WITHOUT CARDIOVERSION N/A 03/31/2016   Procedure: TRANSESOPHAGEAL ECHOCARDIOGRAM (TEE);  Surgeon: Minna Merritts, MD;  Location: [facility];  Service: Cardiovascular;  Laterality: N/A;  . TEE WITHOUT CARDIOVERSION N/A 11/15/2016   Procedure: TRANSESOPHAGEAL ECHOCARDIOGRAM (TEE);  Surgeon: [name], MD;  Location: [facility];  Service: Open Heart Surgery;   Laterality: N/A;  . TRANSCATHETER AORTIC VALVE REPLACEMENT, TRANSFEMORAL N/A 11/15/2016   Procedure: TRANSCATHETER AORTIC VALVE REPLACEMENT, TRANSFEMORAL;  Surgeon: [name], MD;  Location: [facility];  Service: Open Heart Surgery;  Laterality: N/A;  . TUBAL LIGATION      Current Medications: Outpatient Medications Prior to Visit  Medication Sig Dispense Refill  . ACCU-CHEK AVIVA PLUS test strip USE 1 STRIP TO TEST BLOOD SUGARS 3 TIMES A DAY  12  . acetaminophen (TYLENOL) 500 MG tablet Take 500 mg by mouth every 6 (six) hours as needed for mild pain or headache.    . allopurinol (ZYLOPRIM) 100 MG tablet Take 100 mg by mouth daily.    . Amino Acids-Protein Hydrolys (FEEDING SUPPLEMENT, PRO-STAT SUGAR FREE 64,) LIQD Take 30 mLs by mouth 3 (three) times daily with meals.    Marland Kitchen aspirin 81 MG chewable tablet Chew 81 mg by mouth daily.    . brimonidine (ALPHAGAN P) 0.1 % SOLN Place 1 drop into the right eye 2 (two) times daily.    . cetirizine (ZYRTEC) 10 MG tablet Take 1/2 tablet (5 mg) by mouth once daily for allergies    . cholecalciferol (VITAMIN D) 1000 units tablet Take 1,000 Units by mouth daily.    . Dorzolamide HCl-Timolol Mal (COSOPT OP) Place 1 drop into the right eye 2 (two) times daily.     . feeding supplement, ENSURE ENLIVE, (ENSURE ENLIVE) LIQD Take 237 mLs by mouth 2 (two) times daily between meals. 237 mL 12  . fluticasone (FLONASE) 50 MCG/ACT nasal spray Place 1 spray into both nostrils 2 (two) times daily.  11  . gabapentin (NEURONTIN) 300 MG capsule Take 300 mg by mouth 2 (two) times daily.    Marland Kitchen glucose blood test strip Accu-Chek Aviva Plus test strips    . hydrALAZINE (APRESOLINE) 10 MG tablet Take 0.5 tablets (5 mg total) by mouth 3 (three) times daily. Please hold the hydralazine for systolic pressure <782 And for Diastolic pressure <95 90 tablet 3  . HYDROcodone-acetaminophen (NORCO/VICODIN) 5-325 MG tablet Take 1 tablet by mouth every 6 (six) hours as needed for moderate  pain. 30 tablet 0  . iron polysaccharides (NIFEREX) 150 MG capsule Take 150 mg by mouth 2 (two) times daily.    . isosorbide mononitrate (IMDUR) 30 MG 24 hr tablet TAKE 1 TABLET BY MOUTH EVERY DAY 30 tablet 11  . latanoprost (XALATAN) 0.005 % ophthalmic solution latanoprost 0.005 % eye drops    . lidocaine (LIDODERM) 5 % Place onto the skin.    Marland Kitchen lisinopril (PRINIVIL,ZESTRIL) 10 MG tablet Take 10 mg by mouth daily.  HOLD FOR BLOOD PRESSURE LOWER THAN 90/60    . LORazepam (ATIVAN) 0.5 MG tablet Take 0.5 mg by mouth every 12 (twelve) hours.    . lovastatin (MEVACOR) 40 MG tablet Take 40 mg by mouth at bedtime.    . metoprolol succinate (TOPROL-XL) 25 MG 24 hr tablet Take 25 mg by mouth daily.    . Multiple Vitamin (MULTI-VITAMIN PO) Take 1 tablet by mouth daily.    . pantoprazole (PROTONIX) 40 MG tablet Take 40 mg by mouth daily.    . pilocarpine (PILOCAR) 4 % ophthalmic solution Place 1 drop into the right eye 4 (four) times daily.     . polyethylene glycol (MIRALAX) packet Take 17 g by mouth daily. 14 each 0  . potassium chloride (K-DUR) 10 MEQ tablet TAKE 1 TABLET BY MOUTH EVERY DAY TAKE WITH LASIX 30 tablet 10  . senna-docusate (SENOKOT-S) 8.6-50 MG tablet Take 2 tablets by mouth daily as needed for mild constipation.     . sertraline (ZOLOFT) 50 MG tablet Take 50 mg by mouth daily.    . traZODone (DESYREL) 100 MG tablet Take 100 mg by mouth at bedtime.    . vitamin C (ASCORBIC ACID) 500 MG tablet Take 500 mg by mouth daily.    . Zinc Oxide 10 % OINT Apply 1 application topically daily as needed (rash). Applied to patient's bottom    . furosemide (LASIX) 20 MG tablet Take 1 tablet (20 mg total) by mouth daily. Take an additional 20mg  if weight >125lbs 60 tablet 2  . furosemide (LASIX) 20 MG tablet TAKE 1 TABLET (20 MG TOTAL) BY MOUTH DAILY. TAKE AN ADDITIONAL 20MG  IF WEIGHT >125LBS 90 tablet 0   No facility-administered medications prior to visit.      Allergies:   Metformin hcl; Metformin;  and Metformin and related   Social History   Socioeconomic History  . Marital status: Widowed    Spouse name: Not on file  . Number of children: Not on file  . Years of education: Not on file  . Highest education level: Not on file  Occupational History  . Not on file  Social Needs  . Financial resource strain: Not on file  . Food insecurity:    Worry: Not on file    Inability: Not on file  . Transportation needs:    Medical: Not on file    Non-medical: Not on file  Tobacco Use  . Smoking status: Never Smoker  . Smokeless tobacco: Never Used  Substance and Sexual Activity  . Alcohol use: No  . Drug use: No  . Sexual activity: Not on file  Lifestyle  . Physical activity:    Days per week: Not on file    Minutes per session: Not on file  . Stress: Not on file  Relationships  . Social connections:    Talks on phone: Not on file    Gets together: Not on file    Attends religious service: Not on file    Active member of club or organization: Not on file    Attends meetings of clubs or organizations: Not on file    Relationship status: Not on file  Other Topics Concern  . Not on file  Social History Narrative  . Not on file     Family History:  The patient's family history includes Colitis in her father; Diabetes in her mother; Glaucoma in her mother; Peptic Ulcer in her father.      ROS:  Please see the history of present illness.    ROS All other systems reviewed and are negative.   PHYSICAL EXAM:   VS:  BP 124/82   Pulse 87   Ht 5\' 4"  (1.626 m)   Wt 163 lb (73.9 kg)   BMI 27.98 kg/m    GEN: Well nourished, well developed, in no acute distress, elderly  HEENT: normal  Neck: no JVD, carotid bruits, or masses Cardiac: RRR; 3/6 SEM @ RUSB. No rubs, or gallops,no edema  Respiratory:  clear to auscultation bilaterally, normal work of breathing GI: soft, nontender, nondistended, + BS MS: no deformity or atrophy  Skin: warm and dry, no rash Neuro:  Alert and  Oriented x 3, Strength and sensation are intact Psych: euthymic mood, full affect    Wt Readings from Last 3 Encounters:  11/08/17 163 lb (73.9 kg)  09/08/17 157 lb 8 oz (71.4 kg)  07/10/17 152 lb (68.9 kg)      Studies/Labs Reviewed:   EKG:  EKG is NOT ordered today.    Recent Labs: 11/16/2016: Magnesium 1.5 04/02/2017: ALT 25; BUN 47; Creatinine, Ser 0.75; Hemoglobin 12.0; Platelets 201; Potassium 4.2; Sodium 135   Lipid Panel    Component Value Date/Time   CHOL 114 09/11/2014 0246   TRIG 97 09/11/2014 0246   HDL 40 (L) 09/11/2014 0246   CHOLHDL 2.9 09/11/2014 0246   VLDL 19 09/11/2014 0246   LDLCALC 55 09/11/2014 0246    Additional studies/ records that were reviewed today include:  TAVR OPERATIVE NOTE  Date of Procedure:11/15/2016 Procedure:   Transcatheter Aortic Valve Replacement - Percutaneous RightTransfemoral Approach Edwards Sapien 3 THV (size 85mm, model # 9600TFX, serial # [id number])  Pre-operative Echo Findings: ? Severe aortic stenosis and moderate aortic insufficiency ? Normalleft ventricular systolic function  Post-operative Echo Findings: ? Mild central AI ? Mild left ventricular systolic dysfunction _____________   2D ECHO 12/08/16:  Study Conclusions - Left ventricle: The cavity size was normal. There was mild concentric hypertrophy. Systolic function was mildly to moderately reduced. The estimated ejection fraction was in the range of 40% to 45%. Diffuse hypokinesis. Doppler parameters are consistent with abnormal left ventricular relaxation (grade 1 diastolic dysfunction). - Ventricular septum: Septal motion showed abnormal function, dyssynergy, and paradox. These changes are consistent with a left bundle branch block. - Aortic valve: A stent-valve (TAVR) bioprosthesis was present and functioning normally. There was mild regurgitation. It was not possible to determine whether  the regurgitation was valvular or perivalvular, but central valvular AI appears more likely. - Mitral valve: Calcified annulus. There was mild to moderate regurgitation directed centrally. - Left atrium: The atrium was moderately dilated. - Atrial septum: The septum bowed from left to right, consistent with increased left atrial pressure. There was an atrial septal aneurysm. - Pulmonary arteries: Systolic pressure was mildly increased. PA peak pressure: 42 mm Hg (S). - Pericardium, extracardiac: A trivial pericardial effusion was identified. Impressions: - LV systolic function has deteriorated. This appears to be in large part du to systolic dyssynchrony (RV pacing or new LBBB).  _____________   2D ECHO 11/08/17:  Study Conclusions - Left ventricle: Wall thickness was increased in a pattern of   moderate LVH. Doppler parameters are consistent with abnormal   left ventricular relaxation (grade 1 diastolic dysfunction). - Aortic valve: A bioprosthesis was present. - Left atrium: The atrium was mildly dilated. - Pulmonary arteries: Systolic pressure was mildly increased. PA   peak pressure: 37 mm Hg (S).  Aortic  valve                             Value          Reference  Aortic valve peak velocity, S            275   cm/s     ----------  Aortic valve mean velocity, S            189   cm/s     ----------  Aortic valve VTI, S                      62.7  cm       ----------  Aortic mean gradient, S                  16    mm Hg    ----------  Aortic peak gradient, S                  30    mm Hg    ----------  VTI ratio, LVOT/AV                       0.39           ----------  Aortic valve area, VTI                   1.12  cm^2     ----------  Aortic valve area/bsa, VTI               0.62  cm^2/m^2 ----------  Velocity ratio, peak, LVOT/AV            0.36           ----------  Aortic valve area, peak velocity         1.03  cm^2     ----------  Aortic valve area/bsa, peak               0.57  cm^2/m^2 ----------  velocity  Velocity ratio, mean, LVOT/AV            0.4            ----------  Aortic valve area, mean velocity         1.13  cm^2     ----------  Aortic valve area/bsa, mean              0.62  cm^2/m^2 ----------  velocity  ASSESSMENT & PLAN:   Severe AS s/p TAVR: doing well after surgery. 2D ECHO today shows normally functioning TAVR with mean gradient at 16 mm Hg. She has NHYA class II symptoms but is very sedentary and does not move around much. She overall feels much better since having her TAVR. She does not have any teeth and does not wear dentures.   HTN: BP well controlled today. Daughter says BPs are labile and she takes hydralazine when BP spikes but doesn't have to use this often  Chronic combined S/D CHF: appears euvolemic. Continue Lasix 20mg  daily. We have changed the directions on her PRN lasix to a dry weight of 163 lbs as she has gained ~40 lbs of true weight over the past year  SVT: this has been quiescent  DMT2: continue current regimen   Medication Adjustments/Labs and Tests Ordered: Current medicines are reviewed at length with the patient today.  Concerns regarding medicines are outlined above.  Medication changes, Labs and Tests ordered today are listed in the Patient Instructions below. Patient Instructions  Medication Instructions:   Your physician recommends that you continue on your current medications as directed. Please refer to the Current Medication list given to you today.   If you need a refill on your cardiac medications before your next appointment, please call your pharmacy.  Labwork: NONE ORDERED  TODAY    Testing/Procedures: NONE ORDERED  TODAY    Follow-Up:   AS SCHEDULED WITH DR Bary Leriche     Any Other Special Instructions Will Be Listed Below (If Applicable).                                                                                                                                                       Signed, Angelena Form, PA-C  11/08/2017 2:54 PM    [facility] [address], [place], [place]  [address] Phone: [phone]; Fax: [phone]

## 2017-11-08 ENCOUNTER — Other Ambulatory Visit: Payer: Self-pay | Admitting: Cardiovascular Disease

## 2017-11-08 ENCOUNTER — Ambulatory Visit (INDEPENDENT_AMBULATORY_CARE_PROVIDER_SITE_OTHER): Payer: PPO | Admitting: Physician Assistant

## 2017-11-08 ENCOUNTER — Ambulatory Visit (HOSPITAL_COMMUNITY): Payer: PPO | Attending: Cardiovascular Disease

## 2017-11-08 VITALS — BP 124/82 | HR 87 | Ht 64.0 in | Wt 163.0 lb

## 2017-11-08 DIAGNOSIS — I350 Nonrheumatic aortic (valve) stenosis: Secondary | ICD-10-CM | POA: Diagnosis not present

## 2017-11-08 DIAGNOSIS — E119 Type 2 diabetes mellitus without complications: Secondary | ICD-10-CM | POA: Insufficient documentation

## 2017-11-08 DIAGNOSIS — Z952 Presence of prosthetic heart valve: Secondary | ICD-10-CM

## 2017-11-08 DIAGNOSIS — I361 Nonrheumatic tricuspid (valve) insufficiency: Secondary | ICD-10-CM | POA: Insufficient documentation

## 2017-11-08 DIAGNOSIS — I5032 Chronic diastolic (congestive) heart failure: Secondary | ICD-10-CM | POA: Diagnosis not present

## 2017-11-08 DIAGNOSIS — I471 Supraventricular tachycardia, unspecified: Secondary | ICD-10-CM

## 2017-11-08 DIAGNOSIS — I110 Hypertensive heart disease with heart failure: Secondary | ICD-10-CM | POA: Diagnosis not present

## 2017-11-08 DIAGNOSIS — E1159 Type 2 diabetes mellitus with other circulatory complications: Secondary | ICD-10-CM

## 2017-11-08 DIAGNOSIS — I509 Heart failure, unspecified: Secondary | ICD-10-CM | POA: Diagnosis not present

## 2017-11-08 DIAGNOSIS — I10 Essential (primary) hypertension: Secondary | ICD-10-CM | POA: Diagnosis not present

## 2017-11-08 MED ORDER — FUROSEMIDE 20 MG PO TABS: 20.0000 mg | ORAL_TABLET | Freq: Every day | ORAL | 2 refills | Status: DC

## 2017-11-08 NOTE — Patient Instructions (Addendum)
Medication Instructions:   Your physician recommends that you continue on your current medications as directed. Please refer to the Current Medication list given to you today.   If you need a refill on your cardiac medications before your next appointment, please call your pharmacy.  Labwork: NONE ORDERED  TODAY    Testing/Procedures: NONE ORDERED  TODAY    Follow-Up:   AS SCHEDULED WITH DR Bary Leriche     Any Other Special Instructions Will Be Listed Below (If Applicable).

## 2017-11-15 ENCOUNTER — Other Ambulatory Visit: Payer: Self-pay | Admitting: *Deleted

## 2017-11-15 MED ORDER — FUROSEMIDE 20 MG PO TABS: 20.0000 mg | ORAL_TABLET | Freq: Every day | ORAL | 10 refills | Status: DC

## 2017-11-16 ENCOUNTER — Ambulatory Visit: Payer: PPO | Admitting: Physician Assistant

## 2017-11-16 ENCOUNTER — Other Ambulatory Visit (HOSPITAL_COMMUNITY): Payer: PPO

## 2017-11-20 ENCOUNTER — Encounter: Payer: Self-pay | Admitting: Thoracic Surgery (Cardiothoracic Vascular Surgery)

## 2017-11-30 ENCOUNTER — Encounter: Payer: Self-pay | Admitting: Internal Medicine

## 2017-11-30 ENCOUNTER — Ambulatory Visit (INDEPENDENT_AMBULATORY_CARE_PROVIDER_SITE_OTHER): Payer: PPO | Admitting: Internal Medicine

## 2017-11-30 VITALS — BP 146/70 | HR 75 | Temp 98.2°F | Ht 64.0 in | Wt 170.0 lb

## 2017-11-30 DIAGNOSIS — D649 Anemia, unspecified: Secondary | ICD-10-CM

## 2017-11-30 DIAGNOSIS — I10 Essential (primary) hypertension: Secondary | ICD-10-CM

## 2017-11-30 DIAGNOSIS — E042 Nontoxic multinodular goiter: Secondary | ICD-10-CM | POA: Diagnosis not present

## 2017-11-30 DIAGNOSIS — I350 Nonrheumatic aortic (valve) stenosis: Secondary | ICD-10-CM

## 2017-11-30 DIAGNOSIS — I5032 Chronic diastolic (congestive) heart failure: Secondary | ICD-10-CM | POA: Diagnosis not present

## 2017-11-30 DIAGNOSIS — E1159 Type 2 diabetes mellitus with other circulatory complications: Secondary | ICD-10-CM

## 2017-11-30 DIAGNOSIS — E559 Vitamin D deficiency, unspecified: Secondary | ICD-10-CM | POA: Diagnosis not present

## 2017-11-30 DIAGNOSIS — E7800 Pure hypercholesterolemia, unspecified: Secondary | ICD-10-CM | POA: Diagnosis not present

## 2017-11-30 DIAGNOSIS — N289 Disorder of kidney and ureter, unspecified: Secondary | ICD-10-CM | POA: Diagnosis not present

## 2017-11-30 DIAGNOSIS — Z23 Encounter for immunization: Secondary | ICD-10-CM | POA: Diagnosis not present

## 2017-11-30 DIAGNOSIS — D509 Iron deficiency anemia, unspecified: Secondary | ICD-10-CM | POA: Diagnosis not present

## 2017-11-30 DIAGNOSIS — E119 Type 2 diabetes mellitus without complications: Secondary | ICD-10-CM | POA: Diagnosis not present

## 2017-11-30 DIAGNOSIS — Z1159 Encounter for screening for other viral diseases: Secondary | ICD-10-CM

## 2017-11-30 DIAGNOSIS — K623 Rectal prolapse: Secondary | ICD-10-CM

## 2017-11-30 DIAGNOSIS — Z0184 Encounter for antibody response examination: Secondary | ICD-10-CM

## 2017-11-30 MED ORDER — SITAGLIPTIN PHOSPHATE 50 MG PO TABS: 50.0000 mg | ORAL_TABLET | Freq: Every day | ORAL | 3 refills | Status: DC

## 2017-11-30 MED ORDER — POLYSACCHARIDE IRON COMPLEX 150 MG PO CAPS: 150.0000 mg | ORAL_CAPSULE | Freq: Two times a day (BID) | ORAL | 3 refills | Status: DC

## 2017-11-30 MED ORDER — BLOOD GLUCOSE METER KIT: PACK | 0 refills | Status: AC

## 2017-11-30 NOTE — Patient Instructions (Signed)
rec Tdap, shingrix (shingles) and pneumonia 23 vaccine   DTaP Vaccine (Diphtheria, Tetanus, and Pertussis): What You Need to Know 1. Why get vaccinated? Diphtheria, tetanus, and pertussis are serious diseases caused by bacteria. Diphtheria and pertussis are spread from person to person. Tetanus enters the body through cuts or wounds. DIPHTHERIA causes a thick covering in the back of the throat.  It can lead to breathing problems, paralysis, heart failure, and even death.  TETANUS (Lockjaw) causes painful tightening of the muscles, usually all over the body.  It can lead to "locking" of the jaw so the victim cannot open his mouth or swallow. Tetanus leads to death in up to 2 out of 10 cases.  PERTUSSIS (Whooping Cough) causes coughing spells so bad that it is hard for infants to eat, drink, or breathe. These spells can last for weeks.  It can lead to pneumonia, seizures (jerking and staring spells), brain damage, and death.  Diphtheria, tetanus, and pertussis vaccine (DTaP) can help prevent these diseases. Most children who are vaccinated with DTaP will be protected throughout childhood. Many more children would get these diseases if we stopped vaccinating. DTaP is a safer version of an older vaccine called DTP. DTP is no longer used in the [place]. 2. Who should get DTaP vaccine and when? Children should get 5 doses of DTaP vaccine, one dose at each of the following ages:  2 months  4 months  6 months  15-18 months  4-6 years  DTaP may be given at the same time as other vaccines. 3. Some children should not get DTaP vaccine or should wait  Children with minor illnesses, such as a cold, may be vaccinated. But children who are moderately or severely ill should usually wait until they recover before getting DTaP vaccine.  Any child who had a life-threatening allergic reaction after a dose of DTaP should not get another dose.  Any child who suffered a brain or nervous system  disease within 7 days after a dose of DTaP should not get another dose.  Talk with your doctor if your child: ? had a seizure or collapsed after a dose of DTaP, ? cried non-stop for 3 hours or more after a dose of DTaP, ? had a fever over 105F after a dose of DTaP. Ask your doctor for more information. Some of these children should not get another dose of pertussis vaccine, but may get a vaccine without pertussis, called DT. 4. Older children and adults DTaP is not licensed for adolescents, adults, or children 1 years of age and older. But older people still need protection. A vaccine called Tdap is similar to DTaP. A single dose of Tdap is recommended for people 11 through 82 years of age. Another vaccine, called Td, protects against tetanus and diphtheria, but not pertussis. It is recommended every 10 years. There are separate Vaccine Information Statements for these vaccines. 5. What are the risks from DTaP vaccine? Getting diphtheria, tetanus, or pertussis disease is much riskier than getting DTaP vaccine. However, a vaccine, like any medicine, is capable of causing serious problems, such as severe allergic reactions. The risk of DTaP vaccine causing serious harm, or death, is extremely small. Mild problems (common)  Fever (up to about 1 child in 4)  Redness or swelling where the shot was given (up to about 1 child in 4)  Soreness or tenderness where the shot was given (up to about 1 child in 4) These problems occur more often after the  4th and 5th doses of the DTaP series than after earlier doses. Sometimes the 4th or 5th dose of DTaP vaccine is followed by swelling of the entire arm or leg in which the shot was given, lasting 1-7 days (up to about 1 child in 45). Other mild problems include:  Fussiness (up to about 1 child in 3)  Tiredness or poor appetite (up to about 1 child in 10)  Vomiting (up to about 1 child in 53) These problems generally occur 1-3 days after the  shot. Moderate problems (uncommon)  Seizure (jerking or staring) (about 1 child out of 14,000)  Non-stop crying, for 3 hours or more (up to about 1 child out of 1,000)  High fever, over 105F (about 1 child out of 16,000) Severe problems (very rare)  Serious allergic reaction (less than 1 out of a million doses)  Several other severe problems have been reported after DTaP vaccine. These include: ? Long-term seizures, coma, or lowered consciousness ? Permanent brain damage. These are so rare it is hard to tell if they are caused by the vaccine. Controlling fever is especially important for children who have had seizures, for any reason. It is also important if another family member has had seizures. You can reduce fever and pain by giving your child an aspirin-free pain reliever when the shot is given, and for the next 24 hours, following the package instructions. 6. What if there is a serious reaction? What should I look for? Look for anything that concerns you, such as signs of a severe allergic reaction, very high fever, or behavior changes. Signs of a severe allergic reaction can include hives, swelling of the face and throat, difficulty breathing, a fast heartbeat, dizziness, and weakness. These would start a few minutes to a few hours after the vaccination. What should I do?  If you think it is a severe allergic reaction or other emergency that can't wait, call 9-1-1 or get the person to the nearest hospital. Otherwise, call your doctor.  Afterward, the reaction should be reported to the Vaccine Adverse Event Reporting System (VAERS). Your doctor might file this report, or you can do it yourself through the VAERS web site at www.vaers.SamedayNews.es, or by calling (570)883-5537. ? VAERS is only for reporting reactions. They do not give medical advice. 7. The National Vaccine Injury Compensation Program The [employer] Vaccine Injury Compensation Program (VICP) is a federal program that was  created to compensate people who may have been injured by certain vaccines. Persons who believe they may have been injured by a vaccine can learn about the program and about filing a claim by calling (228) 036-7025 or visiting the [place] website at GoldCloset.com.ee. 8. How can I learn more?  Ask your doctor.  Call your local or state health department.  Contact the Centers for Disease Control and Prevention (CDC): ? Call 403-348-9891 (1-800-CDC-INFO) or ? Visit CDC's website at http://hunter.com/ CDC DTaP Vaccine (Diphtheria, Tetanus, and Pertussis) VIS (07/14/05) This information is not intended to replace advice given to you by your health care provider. Make sure you discuss any questions you have with your health care provider. Document Released: 12/12/2005 Document Revised: 11/05/2015 Document Reviewed: 11/05/2015 Elsevier Interactive Patient Education  2017 [employer].

## 2017-11-30 NOTE — Progress Notes (Signed)
Pre visit review using our clinic review tool, if applicable. No additional management support is needed unless otherwise documented below in the visit note. 

## 2017-12-01 ENCOUNTER — Encounter: Payer: Self-pay | Admitting: Internal Medicine

## 2017-12-01 DIAGNOSIS — K623 Rectal prolapse: Secondary | ICD-10-CM | POA: Insufficient documentation

## 2017-12-01 NOTE — Progress Notes (Signed)
Chief Complaint  Patient presents with  . Establish Care   New patient with daughter crystal no complaints  1. HTN uncontrolled/HLD home readings sbp 160s on hydralazine 5 mg 1x per week lasix 20 mg qd, imdur 30 mg qd lis 10 mg qd  2. Chronic CHF, h/o AS s/p AVR f/u with Dr. Rockey Situ  S/p TAVR Echo 10/2017  Left ventricle: Wall thickness was increased in a pattern of   moderate LVH. Doppler parameters are consistent with abnormal   left ventricular relaxation (grade 1 diastolic dysfunction). - Aortic valve: A bioprosthesis was present. - Left atrium: The atrium was mildly dilated. - Pulmonary arteries: Systolic pressure was mildly increased. PA   peak pressure: 37 mm Hg (S).  3. DM2 6.8 09/04/17 per daughter pt likes to eat sweets not currently on DM meds wants new meter current meter not working   4. Rectal prolapse not a surgical candidate using dermacloud wants refill    Review of Systems  Constitutional: Negative for weight loss.  HENT: Negative for hearing loss.   Eyes: Negative for blurred vision.  Respiratory: Negative for shortness of breath.   Cardiovascular: Negative for chest pain.  Gastrointestinal: Negative for abdominal pain.  Genitourinary:       +rectal prolapse  Musculoskeletal: Negative for falls and joint pain.  Skin: Negative for rash.  Neurological: Negative for headaches.  Psychiatric/Behavioral: Negative for depression and memory loss.   Past Medical History:  Diagnosis Date  . Anemia   . Carotid arterial disease ([place])    a. 08/2014 U/S: Bilateral < 50% stenosis. Patent vertebrals w/ antegrade flow.   . Cervical spine arthritis   . Chronic diastolic CHF (congestive heart failure) ([place])    a. echo 2014: EF 55-60%, DD;  b. 08/2014 Echo: EF 55-60%, no RWMA, GR1DD; c. 02/2016 Echo: EF 65-70%, Gr1 DD.  Marland Kitchen Cognitive impairment   . Depression   . Diabetes mellitus without complication ([place])   . Essential hypertension   . GERD (gastroesophageal reflux disease)     . Glaucoma   . Gout   . History of renal impairment   . Hyperlipidemia   . Hypotension    a. Related to Norvasc - 11/2014 ED visit.  . Multinodular goiter    a. Noted incidentally on CT 07/2012 and carotid U/S 08/2014;  b. Nl TSH 11/2014.  Marland Kitchen Paroxysmal SVT (supraventricular tachycardia) (HCC)    a. on Toprol   . Prolapsed uterus   . Severe aortic stenosis    a. 11/15/16: s/p TAVR Edwards Sapien 3 THV (size 23 mm, model # 9600TFX, serial # [id number])  . Stroke [facility])    a. CT head 07/2014 showed small thalamic infarct  . Vitamin deficiency    Past Surgical History:  Procedure Laterality Date  . EYE SURGERY     unsure of what procedure, did know that laser was used  . IR RADIOLOGY PERIPHERAL GUIDED IV START  11/03/2016  . IR US GUIDE VASC ACCESS RIGHT  11/03/2016  . RIGHT/LEFT HEART CATH AND CORONARY ANGIOGRAPHY N/A 10/26/2016   Procedure: RIGHT/LEFT HEART CATH AND CORONARY ANGIOGRAPHY;  Surgeon: [name], MD;  Location: [facility];  Service: Cardiovascular;  Laterality: N/A;  . TEE WITHOUT CARDIOVERSION N/A 03/31/2016   Procedure: TRANSESOPHAGEAL ECHOCARDIOGRAM (TEE);  Surgeon: Minna Merritts, MD;  Location: [facility] ORS;  Service: Cardiovascular;  Laterality: N/A;  . TEE WITHOUT CARDIOVERSION N/A 11/15/2016   Procedure: TRANSESOPHAGEAL ECHOCARDIOGRAM (TEE);  Surgeon: [name], MD;  Location: [facility]  OR;  Service: Open Heart Surgery;  Laterality: N/A;  . TRANSCATHETER AORTIC VALVE REPLACEMENT, TRANSFEMORAL N/A 11/15/2016   Procedure: TRANSCATHETER AORTIC VALVE REPLACEMENT, TRANSFEMORAL;  Surgeon: [name], MD;  Location: [facility];  Service: Open Heart Surgery;  Laterality: N/A;  . TUBAL LIGATION     Family History  Problem Relation Age of Onset  . Glaucoma Mother   . Diabetes Mother   . Peptic Ulcer Father   . Colitis Father    Social History   Socioeconomic History  . Marital status: Widowed    Spouse name: Not on file  . Number of children: Not on file  . Years of  education: Not on file  . Highest education level: Not on file  Occupational History  . Not on file  Social Needs  . Financial resource strain: Not on file  . Food insecurity:    Worry: Not on file    Inability: Not on file  . Transportation needs:    Medical: Not on file    Non-medical: Not on file  Tobacco Use  . Smoking status: Never Smoker  . Smokeless tobacco: Never Used  Substance and Sexual Activity  . Alcohol use: No  . Drug use: No  . Sexual activity: Not on file  Lifestyle  . Physical activity:    Days per week: Not on file    Minutes per session: Not on file  . Stress: Not on file  Relationships  . Social connections:    Talks on phone: Not on file    Gets together: Not on file    Attends religious service: Not on file    Active member of club or organization: Not on file    Attends meetings of clubs or organizations: Not on file    Relationship status: Not on file  . Intimate partner violence:    Fear of current or ex partner: Not on file    Emotionally abused: Not on file    Physically abused: Not on file    Forced sexual activity: Not on file  Other Topics Concern  . Not on file  Social History Narrative   Lives at home    11 kids    Current Meds  Medication Sig  . ACCU-CHEK AVIVA PLUS test strip USE 1 STRIP TO TEST BLOOD SUGARS 3 TIMES A DAY  . acetaminophen (TYLENOL) 500 MG tablet Take 500 mg by mouth every 6 (six) hours as needed for mild pain or headache.  . allopurinol (ZYLOPRIM) 100 MG tablet Take 100 mg by mouth daily.  . Amino Acids-Protein Hydrolys (FEEDING SUPPLEMENT, PRO-STAT SUGAR FREE 64,) LIQD Take 30 mLs by mouth 3 (three) times daily with meals.  Marland Kitchen aspirin 81 MG chewable tablet Chew 81 mg by mouth daily.  . brimonidine (ALPHAGAN P) 0.1 % SOLN Place 1 drop into the right eye 2 (two) times daily.  . cetirizine (ZYRTEC) 10 MG tablet Take 1/2 tablet (5 mg) by mouth once daily for allergies  . cholecalciferol (VITAMIN D) 1000 units tablet  Take 1,000 Units by mouth daily.  . Dorzolamide HCl-Timolol Mal (COSOPT OP) Place 1 drop into the right eye 2 (two) times daily.   . feeding supplement, ENSURE ENLIVE, (ENSURE ENLIVE) LIQD Take 237 mLs by mouth 2 (two) times daily between meals.  . fluticasone (FLONASE) 50 MCG/ACT nasal spray Place 1 spray into both nostrils 2 (two) times daily.  . furosemide (LASIX) 20 MG tablet Take 1 tablet (20 mg total) by mouth daily.  Take an additional 20mg  if weight >164lbs  . gabapentin (NEURONTIN) 300 MG capsule Take 300 mg by mouth 2 (two) times daily.  Marland Kitchen glucose blood test strip Accu-Chek Aviva Plus test strips  . hydrALAZINE (APRESOLINE) 10 MG tablet Take 0.5 tablets (5 mg total) by mouth 3 (three) times daily. Please hold the hydralazine for systolic pressure <073 And for Diastolic pressure <71  . HYDROcodone-acetaminophen (NORCO/VICODIN) 5-325 MG tablet Take 1 tablet by mouth every 6 (six) hours as needed for moderate pain.  . iron polysaccharides (NIFEREX) 150 MG capsule Take 1 capsule (150 mg total) by mouth 2 (two) times daily.  . isosorbide mononitrate (IMDUR) 30 MG 24 hr tablet TAKE 1 TABLET BY MOUTH EVERY DAY  . latanoprost (XALATAN) 0.005 % ophthalmic solution latanoprost 0.005 % eye drops  . lidocaine (LIDODERM) 5 % Place onto the skin.  Marland Kitchen lisinopril (PRINIVIL,ZESTRIL) 10 MG tablet Take 10 mg by mouth daily. HOLD FOR BLOOD PRESSURE LOWER THAN 90/60  . LORazepam (ATIVAN) 0.5 MG tablet Take 0.5 mg by mouth every 12 (twelve) hours.  . lovastatin (MEVACOR) 40 MG tablet Take 40 mg by mouth at bedtime.  . metoprolol succinate (TOPROL-XL) 25 MG 24 hr tablet Take 25 mg by mouth daily.  . Multiple Vitamin (MULTI-VITAMIN PO) Take 1 tablet by mouth daily.  . pantoprazole (PROTONIX) 40 MG tablet Take 40 mg by mouth daily.  . pilocarpine (PILOCAR) 4 % ophthalmic solution Place 1 drop into the right eye 4 (four) times daily.   . polyethylene glycol (MIRALAX) packet Take 17 g by mouth daily.  . potassium  chloride (K-DUR) 10 MEQ tablet TAKE 1 TABLET BY MOUTH EVERY DAY TAKE WITH LASIX  . senna-docusate (SENOKOT-S) 8.6-50 MG tablet Take 2 tablets by mouth daily as needed for mild constipation.   . sertraline (ZOLOFT) 50 MG tablet Take 50 mg by mouth daily.  . traZODone (DESYREL) 100 MG tablet Take 100 mg by mouth at bedtime.  . vitamin C (ASCORBIC ACID) 500 MG tablet Take 500 mg by mouth daily.  . Zinc Oxide 10 % OINT Apply 1 application topically daily as needed (rash). Applied to patient's bottom  . [DISCONTINUED] iron polysaccharides (NIFEREX) 150 MG capsule Take 150 mg by mouth 2 (two) times daily.   Allergies  Allergen Reactions  . Metformin Hcl Other (See Comments)    GI upset  . Metformin Other (See Comments), Diarrhea and Nausea And Vomiting  . Metformin And Related Diarrhea   No results found for this or any previous visit (from the past 2160 hour(s)). Objective  Body mass index is 29.18 kg/m. Wt Readings from Last 3 Encounters:  11/30/17 170 lb (77.1 kg)  11/08/17 163 lb (73.9 kg)  09/08/17 157 lb 8 oz (71.4 kg)   Temp Readings from Last 3 Encounters:  11/30/17 98.2 F (36.8 C) (Oral)  04/11/17 (!) 97.5 F (36.4 C) (Oral)  04/02/17 98.3 F (36.8 C) (Oral)   BP Readings from Last 3 Encounters:  11/30/17 (!) 146/70  11/08/17 124/82  09/08/17 (!) 160/84   Pulse Readings from Last 3 Encounters:  11/30/17 75  11/08/17 87  09/08/17 67    Physical Exam  Constitutional: She is oriented to person, place, and time. Vital signs are normal. She appears well-developed and well-nourished. She is cooperative.  HENT:  Head: Normocephalic and atraumatic.  Mouth/Throat: Oropharynx is clear and moist and mucous membranes are normal.  Eyes: Pupils are equal, round, and reactive to light. Conjunctivae are normal.  Cardiovascular: Normal  rate, regular rhythm and normal heart sounds.  Pulmonary/Chest: Effort normal and breath sounds normal.  Genitourinary:     Neurological: She  is alert and oriented to person, place, and time. Gait normal.  Skin: Skin is warm, dry and intact.  Psychiatric: She has a normal mood and affect. Her speech is normal and behavior is normal. Judgment and thought content normal. Cognition and memory are normal.  Nursing note and vitals reviewed.   Assessment   1. HTN uncontrolled/HLD home readings sbp 160s  2. Chronic CHF, h/o AS s/p TAVR  Echo 10/2017  3. DM2 A1C 6.8 09/04/17 with CKD 3  4. Rectal prolapse not a surgical candidate  5. HM  Plan   1.  Hydralazine taking 5 mg 1-2 x per day prn family currently giving 1x per week  hydralazine 5 mg 1x per week rec use more rec if needed prn BP>140/>90  lasix 20 mg qd, imdur 30 mg qd lis 10 mg qd  Log BP 2. Dr. Rockey Situ On lasix 20 mg qd  3.  Add januvia 50 mg qd  Meter  Disc eye due in 6 months  and foot exam at f/u  Consider pna 23  Check labs upcoming  4. dermcloud renewed  No skin breakdown today  5.  Flu shot had Today  Consider pna 23 and Tdap in future  Rx shingrix  prevnar utd   Check fasting labs  Pap 05/07/12 negative  Mammogram 04/27/16 negative  DEXA 12/18/06 osteopenia  Colonoscopy 03/04/97 hyperplastic polyps no f/u  Provider: Dr. Olivia Mackie McLean-Scocuzza-Internal Medicine

## 2017-12-27 ENCOUNTER — Other Ambulatory Visit: Payer: PPO

## 2018-01-04 ENCOUNTER — Other Ambulatory Visit (INDEPENDENT_AMBULATORY_CARE_PROVIDER_SITE_OTHER): Payer: PPO

## 2018-01-04 ENCOUNTER — Ambulatory Visit: Payer: PPO | Admitting: Podiatry

## 2018-01-04 ENCOUNTER — Encounter: Payer: Self-pay | Admitting: Podiatry

## 2018-01-04 ENCOUNTER — Other Ambulatory Visit: Payer: PPO

## 2018-01-04 DIAGNOSIS — M79676 Pain in unspecified toe(s): Secondary | ICD-10-CM

## 2018-01-04 DIAGNOSIS — E1159 Type 2 diabetes mellitus with other circulatory complications: Secondary | ICD-10-CM

## 2018-01-04 DIAGNOSIS — I10 Essential (primary) hypertension: Secondary | ICD-10-CM | POA: Diagnosis not present

## 2018-01-04 DIAGNOSIS — Z1159 Encounter for screening for other viral diseases: Secondary | ICD-10-CM

## 2018-01-04 DIAGNOSIS — D649 Anemia, unspecified: Secondary | ICD-10-CM | POA: Diagnosis not present

## 2018-01-04 DIAGNOSIS — E559 Vitamin D deficiency, unspecified: Secondary | ICD-10-CM | POA: Diagnosis not present

## 2018-01-04 DIAGNOSIS — Z0184 Encounter for antibody response examination: Secondary | ICD-10-CM | POA: Diagnosis not present

## 2018-01-04 DIAGNOSIS — E042 Nontoxic multinodular goiter: Secondary | ICD-10-CM | POA: Diagnosis not present

## 2018-01-04 DIAGNOSIS — D689 Coagulation defect, unspecified: Secondary | ICD-10-CM

## 2018-01-04 DIAGNOSIS — B351 Tinea unguium: Secondary | ICD-10-CM

## 2018-01-04 LAB — COMPREHENSIVE METABOLIC PANEL
ALBUMIN: 3.8 g/dL (ref 3.5–5.2)
ALT: 11 U/L (ref 0–35)
AST: 20 U/L (ref 0–37)
Alkaline Phosphatase: 48 U/L (ref 39–117)
BUN: 42 mg/dL — ABNORMAL HIGH (ref 6–23)
CHLORIDE: 102 meq/L (ref 96–112)
CO2: 31 mEq/L (ref 19–32)
Calcium: 9.6 mg/dL (ref 8.4–10.5)
Creatinine, Ser: 1.5 mg/dL — ABNORMAL HIGH (ref 0.40–1.20)
GFR: 41.84 mL/min — ABNORMAL LOW (ref >60.00)
Glucose, Bld: 122 mg/dL — ABNORMAL HIGH (ref 70–99)
Potassium: 4.5 mEq/L (ref 3.5–5.1)
Sodium: 140 mEq/L (ref 135–145)
Total Bilirubin: 0.5 mg/dL (ref 0.2–1.2)
Total Protein: 7.1 g/dL (ref 6.0–8.3)

## 2018-01-04 LAB — CBC WITH DIFFERENTIAL/PLATELET
BASOS ABS: 0.1 10*3/uL (ref 0.0–0.1)
Basophils Relative: 1 % (ref 0.0–3.0)
Eosinophils Absolute: 0.6 10*3/uL (ref 0.0–0.7)
Eosinophils Relative: 5.8 % — ABNORMAL HIGH (ref 0.0–5.0)
HCT: 34 % — ABNORMAL LOW (ref 36.0–46.0)
HEMOGLOBIN: 11.4 g/dL — AB (ref 12.0–15.0)
Lymphocytes Relative: 9.7 % — ABNORMAL LOW (ref 12.0–46.0)
Lymphs Abs: 1 10*3/uL (ref 0.7–4.0)
MCHC: 33.4 g/dL (ref 30.0–36.0)
MCV: 92.7 fl (ref 78.0–100.0)
MONO ABS: 0.8 10*3/uL (ref 0.1–1.0)
Monocytes Relative: 8.3 % (ref 3.0–12.0)
Neutro Abs: 7.5 10*3/uL (ref 1.4–7.7)
Neutrophils Relative %: 75.2 % (ref 43.0–77.0)
Platelets: 231 10*3/uL (ref 150.0–400.0)
RBC: 3.67 Mil/uL — AB (ref 3.87–5.11)
RDW: 14.6 % (ref 11.5–15.5)
WBC: 9.9 10*3/uL (ref 4.0–10.5)

## 2018-01-04 LAB — HEMOGLOBIN A1C: HEMOGLOBIN A1C: 7.9 % — AB (ref 4.6–6.5)

## 2018-01-04 LAB — MAGNESIUM: MAGNESIUM: 2.1 mg/dL (ref 1.5–2.5)

## 2018-01-04 LAB — TSH: TSH: 1.21 u[IU]/mL (ref 0.35–4.50)

## 2018-01-04 LAB — VITAMIN D 25 HYDROXY (VIT D DEFICIENCY, FRACTURES): VITD: 80.01 ng/mL (ref 30.00–100.00)

## 2018-01-04 NOTE — Progress Notes (Signed)
Complaint:  Visit Type: Patient returns to my office for continued preventative foot care services. Complaint: Patient states" my nails have grown long and thick and become painful to walk and wear shoes" Patient has been diagnosed with DM with no foot complications. The patient presents for preventative foot care services. No changes to ROS.  Patient is taking plavix.   Podiatric Exam: Vascular: dorsalis pedis  pulses are weakly palpable bilateral. PT pulses are not palpable  B/L. Capillary return is immediate. Temperature gradient is WNL. Skin turgor WNL  Sensorium: Normal Semmes Weinstein monofilament test. Normal tactile sensation bilaterally. Nail Exam: Pt has thick disfigured discolored nails with subungual debris noted bilateral entire nail hallux through fifth toenails Ulcer Exam: There is no evidence of ulcer or pre-ulcerative changes or infection. Orthopedic Exam: Muscle tone and strength are WNL. No limitations in general ROM. No crepitus or effusions noted. Foot type and digits show no abnormalities. HAV  B/L. Skin: No Porokeratosis. No infection or ulcers  Diagnosis:  Onychomycosis, , Pain in right toe, pain in left toes,  Diabetes with vascular disease. HAV  B/L.  Treatment & Plan Procedures and Treatment: Consent by patient was obtained for treatment procedures.   Debridement of mycotic and hypertrophic toenails, 1 through 5 bilateral and clearing of subungual debris. No ulceration, no infection noted. Return Visit-Office Procedure: Patient instructed to return to the office for a follow up visit 3 months for continued evaluation and treatment.    Gardiner Barefoot DPM

## 2018-01-04 NOTE — Addendum Note (Signed)
Addended by: Leeanne Rio on: 01/04/2018 08:41 AM   Modules accepted: Orders

## 2018-01-05 ENCOUNTER — Encounter: Payer: Self-pay | Admitting: Family Medicine

## 2018-01-05 ENCOUNTER — Ambulatory Visit (INDEPENDENT_AMBULATORY_CARE_PROVIDER_SITE_OTHER): Payer: PPO | Admitting: Family Medicine

## 2018-01-05 VITALS — BP 110/70 | HR 80 | Temp 97.7°F | Ht 63.0 in | Wt 172.6 lb

## 2018-01-05 DIAGNOSIS — E119 Type 2 diabetes mellitus without complications: Secondary | ICD-10-CM | POA: Diagnosis not present

## 2018-01-05 DIAGNOSIS — I350 Nonrheumatic aortic (valve) stenosis: Secondary | ICD-10-CM

## 2018-01-05 DIAGNOSIS — I5032 Chronic diastolic (congestive) heart failure: Secondary | ICD-10-CM | POA: Diagnosis not present

## 2018-01-05 LAB — URINALYSIS, ROUTINE W REFLEX MICROSCOPIC
BILIRUBIN UA: NEGATIVE
Glucose, UA: NEGATIVE
Ketones, UA: NEGATIVE
Nitrite, UA: NEGATIVE
PH UA: 6 (ref 5.0–7.5)
PROTEIN UA: NEGATIVE
RBC, UA: NEGATIVE
Specific Gravity, UA: 1.016 (ref 1.005–1.030)
UUROB: 1 mg/dL (ref 0.2–1.0)

## 2018-01-05 LAB — IRON,TIBC AND FERRITIN PANEL
%SAT: 16 % (ref 16–45)
FERRITIN: 334 ng/mL — AB (ref 16–288)
IRON: 35 ug/dL — AB (ref 45–160)
TIBC: 219 ug/dL — AB (ref 250–450)

## 2018-01-05 LAB — MICROSCOPIC EXAMINATION

## 2018-01-05 LAB — MEASLES/MUMPS/RUBELLA IMMUNITY
Mumps IgG: 114 AU/mL
Rubella: 1.27 index

## 2018-01-05 LAB — MICROALBUMIN / CREATININE URINE RATIO
Creatinine, Urine: 120.7 mg/dL
MICROALB/CREAT RATIO: 10.4 mg/g{creat} (ref 0.0–30.0)
Microalbumin, Urine: 12.5 ug/mL

## 2018-01-05 NOTE — Progress Notes (Signed)
Subjective:    Patient ID: [name], female    DOB: March 31, 1924, 84 y.o.   MRN: [id number]  HPI  Presents to clinic with daughter due to worsening weakness and swelling in ankles.  Daughter also has stipulations from cardiologist Dr. Rockey Situ that she can give mother a extra dose of 20 mg of Lasix when she has increased swelling, states she has given her an extra dose 2 times in the past couple of weeks.  Daughter is concerned that mother states she feels more tired.  Patient does sleep sitting up at night as this position is most comfortable for her and allows her to breathe well.  Blood sugars have been ranging 130-150 in the morning and 180s - 200 in the afternoon.  Daughter states she has been trying to restrict mother's intake of ice cream, but patient really likes ice cream and continues to request ice cream and milkshakes from [facility].  I reviewed patient's medical history, she has congestive heart failure, severe aortic stenosis, diabetes and multiple other medical issues.  Lab work done yesterday reviewed, everything appears stable.  No acute abnormalities and labs.  Patient Active Problem List   Diagnosis Date Noted  . Rectal prolapse 12/01/2017  . CHF (congestive heart failure) ([place]) 04/11/2017  . Other insomnia 01/02/2017  . Sedative hypnotic or anxiolytic dependence ([place]) 01/02/2017  . Severe aortic stenosis 11/15/2016  . Cognitive impairment 09/26/2016  . Gout 09/26/2016  . Hiatal hernia with gastroesophageal reflux 09/26/2016  . Lumbar and sacral spondylarthritis 09/26/2016  . Multiple lacunar infarcts 09/26/2016  . Multiple pulmonary nodules 09/26/2016  . Obesity 09/26/2016  . Recurrent UTI 09/26/2016  . Shoulder pain, bilateral 09/26/2016  . Trigeminal neuralgia 09/26/2016  . Urinary leakage 09/26/2016  . Uterine prolapse 09/26/2016  . Vitamin D deficiency 09/26/2016  . Aortic stenosis 05/03/2016  . Sepsis ([place])   . Osteoporosis 10/29/2015  .  Hypertension   . Hyperlipidemia   . PSVT (paroxysmal supraventricular tachycardia) ([place])   . Multiple thyroid nodules   . Carotid arterial disease ([place])   . Sleepiness 12/25/2014  . Renal insufficiency 08/28/2014  . Hydronephrosis, bilateral 08/28/2014  . Hammer toe of left foot 03/10/2014  . Hammer toe of right foot 03/10/2014  . Onychogryphosis 03/10/2014  . Orofacial dyskinesia 02/28/2013  . Type 2 diabetes mellitus without complication, without long-term current use of insulin ([place]) 12/17/2012  . SOB (shortness of breath) 12/17/2012  . Chronic diastolic CHF (congestive heart failure) ([place]) 12/17/2012  . Hypomagnesemia 09/22/2012  . Cervical spine arthritis 06/29/2010   Social History   Tobacco Use  . Smoking status: Never Smoker  . Smokeless tobacco: Never Used  Substance Use Topics  . Alcohol use: No   Review of Systems  Constitutional: +weakness, general malaise. Negative for chills, and fever.  HENT: Negative for congestion, ear pain, sinus pain and sore throat.   Eyes: Negative.   Respiratory: Negative for cough, shortness of breath and wheezing.   Cardiovascular: Negative for chest pain, palpitations. +LE swelling Gastrointestinal: Negative for abdominal pain, diarrhea, nausea and vomiting.  Genitourinary: Negative for dysuria, frequency and urgency.  Musculoskeletal: Negative for arthralgias and myalgias.  Skin: Negative for color change, pallor and rash.  Neurological: Negative for syncope, light-headedness and headaches.  Psychiatric/Behavioral: The patient is not nervous/anxious.       Objective:   Physical Exam  Constitutional: She is oriented to person, place, and time. No distress.  HENT:  Head: Normocephalic and atraumatic.  Eyes: EOM are  normal. No scleral icterus.  Neck: No tracheal deviation present.  Cardiovascular: Normal rate, regular rhythm and normal heart sounds.  Pulmonary/Chest: Effort normal and breath sounds normal. No respiratory  distress.  Musculoskeletal:  Trace edema bilat LEs today. Walks well without assistance.   Neurological: She is alert and oriented to person, place, and time.  Skin: Skin is warm and dry. She is not diaphoretic. No pallor.  Psychiatric: She has a normal mood and affect. Her behavior is normal.  Nursing note and vitals reviewed.     Vitals:   01/05/18 0913  BP: 110/70  Pulse: 80  Temp: 97.7 F (36.5 C)  SpO2: 91%   Assessment & Plan:   CHF, severe aortic stenosis --continue all medications as currently prescribed by cardiology.  Daughter will continue to use extra dose of Lasix as needed if having increased leg swelling or patient's weight increases.  Type 2 diabetes- overall diabetes numbers appear very well controlled.  Patient will continue Januvia 50 mg daily.  Discussed with daughter and patient that once in a while having ice cream or milk shake would be okay, because for the most part she is following a healthy diet.   A total of 25  minutes were spent face-to-face with the patient during this encounter and over half of that time was spent on counseling and coordination of care. The patient was counseled on referral to palliative care.  Due to mother's multiple chronic medical diagnoses and age, I think she would benefit from assistance with palliative care.  Daughter states patient has had palliative care in the past and she did like that they were able to come in and see her mom in the home.  Patient and daughter are both agreeable to another palliative care referral.  Daughter states patient is a DNR.   Keep regularly scheduled follow-up with PCP as planned.  Return to clinic sooner if any issues arise.

## 2018-01-15 ENCOUNTER — Other Ambulatory Visit: Payer: Self-pay | Admitting: Internal Medicine

## 2018-01-15 DIAGNOSIS — N179 Acute kidney failure, unspecified: Secondary | ICD-10-CM

## 2018-02-06 ENCOUNTER — Other Ambulatory Visit: Payer: Self-pay | Admitting: Internal Medicine

## 2018-02-06 NOTE — Telephone Encounter (Signed)
Copied from [place] [id number]. Topic: Quick Communication - Rx Refill/Question >> Feb 06, 2018  1:54 PM Green, Cassandra wrote: Medication: sertraline (ZOLOFT) 50 MG tablet  Has the patient contacted their pharmacy? yes   Preferred Pharmacy (with phone number or street name): [id number] - [place], [place] [phone] (Phone) [phone] (Fax)  Agent: Please be advised that RX refills may take up to 3 business days. We ask that you follow-up with your pharmacy.

## 2018-02-08 MED ORDER — SERTRALINE HCL 50 MG PO TABS: 50.0000 mg | ORAL_TABLET | Freq: Every day | ORAL | 1 refills | Status: DC

## 2018-02-08 NOTE — Telephone Encounter (Signed)
SSRI protocol passed-last ordered 11/13/16 by historical provider so routed to PCP for consideration. This medication was not noted in last 2 OV notes.   Requested medication (s) are due for refill today y  Requested medication (s) are on the active medication list y  Future visit scheduled y   Requested Prescriptions  Pending Prescriptions Disp Refills   sertraline (ZOLOFT) 50 MG tablet      Sig: Take 1 tablet (50 mg total) by mouth daily.     Psychiatry:  Antidepressants - SSRI Passed - 02/07/2018  7:49 AM      Passed - Valid encounter within last 6 months    Recent Outpatient Visits          1 month ago Chronic diastolic CHF (congestive heart failure) ([place])   McCool Guse, Jacquelynn Cree, FNP   2 months ago Type 2 diabetes mellitus without complication, without long-term current use of insulin ([place])   Sharon McLean-Scocuzza, Nino Glow, MD      Future Appointments            In 2 months McLean-Scocuzza, Nino Glow, MD [facility]

## 2018-02-16 ENCOUNTER — Ambulatory Visit: Payer: Self-pay

## 2018-02-16 NOTE — Telephone Encounter (Signed)
Patient's daughter called in with the patient having increased bilateral leg swelling, she noted to call the patient at her home number to discuss. The patient was called and asked about her leg swelling. She says "they are both swollen up to the knees, tight feeling, no pain, no redness, no fever." I asked about other symptoms, she denies SOB, CP. I asked about daily weights, she says "I weigh everyday." The caregiver says the weights are: 12/16-173.4; yesterday-171.8; today 172.6. The patient says she is taking her lasix everyday as prescribed. According to protocol, see PCP within 24 hours, appointment scheduled for Monday, 02/19/18 at 1030 with Dr. Terese Door, care advice given, patient and caregiver advised to go to ED for increasing SOB, increasing weight of 174 or above over the weekend, redness to the legs, weeping legs, both verbalized understanding.   Reason for Disposition . [1] MODERATE leg swelling (e.g., swelling extends up to knees) AND [2] new onset or worsening  Answer Assessment - Initial Assessment Questions 1. ONSET: "When did the swelling start?" (e.g., minutes, hours, days)     Stay swollen now 2. LOCATION: "What part of the leg is swollen?"  "Are both legs swollen or just one leg?"     Up to knees 3. SEVERITY: "How bad is the swelling?" (e.g., localized; mild, moderate, severe)  - Localized - small area of swelling localized to one leg  - MILD pedal edema - swelling limited to foot and ankle, pitting edema < 1/4 inch (6 mm) deep, rest and elevation eliminate most or all swelling  - MODERATE edema - swelling of lower leg to knee, pitting edema > 1/4 inch (6 mm) deep, rest and elevation only partially reduce swelling  - SEVERE edema - swelling extends above knee, facial or hand swelling present      Moderate 4. REDNESS: "Does the swelling look red or infected?"     No 5. PAIN: "Is the swelling painful to touch?" If so, ask: "How painful is it?"   (Scale 1-10; mild,  moderate or severe)     No 6. FEVER: "Do you have a fever?" If so, ask: "What is it, how was it measured, and when did it start?"      No 7. CAUSE: "What do you think is causing the leg swelling?"     I don't know 8. MEDICAL HISTORY: "Do you have a history of heart failure, kidney disease, liver failure, or cancer?"     Yes 9. RECURRENT SYMPTOM: "Have you had leg swelling before?" If so, ask: "When was the last time?" "What happened that time?"     Yes, stay swollen all the time 10. OTHER SYMPTOMS: "Do you have any other symptoms?" (e.g., chest pain, difficulty breathing)       No 11. PREGNANCY: "Is there any chance you are pregnant?" "When was your last menstrual period?"       No  Protocols used: LEG SWELLING AND EDEMA-A-AH

## 2018-02-18 ENCOUNTER — Emergency Department: Payer: PPO

## 2018-02-18 ENCOUNTER — Emergency Department
Admission: EM | Admit: 2018-02-18 | Discharge: 2018-02-18 | Disposition: A | Discharge status: Home / Self Care | Payer: PPO | Attending: Emergency Medicine | Admitting: Emergency Medicine

## 2018-02-18 ENCOUNTER — Encounter: Payer: Self-pay | Admitting: Emergency Medicine

## 2018-02-18 ENCOUNTER — Other Ambulatory Visit: Payer: Self-pay

## 2018-02-18 DIAGNOSIS — R600 Localized edema: Secondary | ICD-10-CM | POA: Diagnosis not present

## 2018-02-18 DIAGNOSIS — Z7982 Long term (current) use of aspirin: Secondary | ICD-10-CM | POA: Insufficient documentation

## 2018-02-18 DIAGNOSIS — F329 Major depressive disorder, single episode, unspecified: Secondary | ICD-10-CM | POA: Diagnosis not present

## 2018-02-18 DIAGNOSIS — R609 Edema, unspecified: Secondary | ICD-10-CM | POA: Diagnosis not present

## 2018-02-18 DIAGNOSIS — Z79899 Other long term (current) drug therapy: Secondary | ICD-10-CM | POA: Insufficient documentation

## 2018-02-18 DIAGNOSIS — R2240 Localized swelling, mass and lump, unspecified lower limb: Secondary | ICD-10-CM | POA: Diagnosis present

## 2018-02-18 DIAGNOSIS — I2510 Atherosclerotic heart disease of native coronary artery without angina pectoris: Secondary | ICD-10-CM | POA: Diagnosis not present

## 2018-02-18 DIAGNOSIS — I110 Hypertensive heart disease with heart failure: Secondary | ICD-10-CM | POA: Diagnosis not present

## 2018-02-18 DIAGNOSIS — R0602 Shortness of breath: Secondary | ICD-10-CM | POA: Diagnosis not present

## 2018-02-18 DIAGNOSIS — R531 Weakness: Secondary | ICD-10-CM | POA: Diagnosis not present

## 2018-02-18 DIAGNOSIS — I509 Heart failure, unspecified: Secondary | ICD-10-CM | POA: Insufficient documentation

## 2018-02-18 LAB — TROPONIN I: Troponin I: <0.03 ng/mL (ref <0.03)

## 2018-02-18 LAB — COMPREHENSIVE METABOLIC PANEL
ALT: 14 U/L (ref 0–44)
AST: 22 U/L (ref 15–41)
Albumin: 3.9 g/dL (ref 3.5–5.0)
Alkaline Phosphatase: 52 U/L (ref 38–126)
Anion gap: 10 (ref 5–15)
BUN: 37 mg/dL — ABNORMAL HIGH (ref 8–23)
CHLORIDE: 101 mmol/L (ref 98–111)
CO2: 27 mmol/L (ref 22–32)
Calcium: 9.2 mg/dL (ref 8.9–10.3)
Creatinine, Ser: 1.47 mg/dL — ABNORMAL HIGH (ref 0.44–1.00)
GFR calc Af Amer: 36 mL/min — ABNORMAL LOW (ref >60)
GFR calc non Af Amer: 31 mL/min — ABNORMAL LOW (ref >60)
Glucose, Bld: 130 mg/dL — ABNORMAL HIGH (ref 70–99)
Potassium: 4.6 mmol/L (ref 3.5–5.1)
Sodium: 138 mmol/L (ref 135–145)
Total Bilirubin: 0.6 mg/dL (ref 0.3–1.2)
Total Protein: 7.2 g/dL (ref 6.5–8.1)

## 2018-02-18 LAB — CBC
HCT: 34.3 % — ABNORMAL LOW (ref 36.0–46.0)
Hemoglobin: 11 g/dL — ABNORMAL LOW (ref 12.0–15.0)
MCH: 30.6 pg (ref 26.0–34.0)
MCHC: 32.1 g/dL (ref 30.0–36.0)
MCV: 95.3 fL (ref 80.0–100.0)
PLATELETS: 178 10*3/uL (ref 150–400)
RBC: 3.6 MIL/uL — ABNORMAL LOW (ref 3.87–5.11)
RDW: 14.6 % (ref 11.5–15.5)
WBC: 10.7 10*3/uL — ABNORMAL HIGH (ref 4.0–10.5)
nRBC: 0 % (ref 0.0–0.2)

## 2018-02-18 LAB — BRAIN NATRIURETIC PEPTIDE: B Natriuretic Peptide: 68 pg/mL (ref 0.0–100.0)

## 2018-02-18 MED ORDER — FUROSEMIDE 10 MG/ML IJ SOLN
60.0000 mg | Freq: Once | INTRAMUSCULAR | Status: AC
  Administered 2018-02-18: 60 mg via INTRAVENOUS
  Filled 2018-02-18: qty 8

## 2018-02-18 NOTE — ED Triage Notes (Signed)
Pt brought to ED via EMS from home with complaints of bilat leg swelling. PMD recently increased her Lasix to 40 mg. Family reports that it is not helping. Pt denies any pain and did not want to come. Denies SOB.

## 2018-02-18 NOTE — Discharge Instructions (Addendum)
As we discussed please increase your Lasix to 60 mg each morning for the next 5 days starting tomorrow 02/19/2018.  Return to the emergency department for any increased trouble breathing development of any chest pain, or any other symptom personally concerning to yourself.  Otherwise please follow-up with your cardiologist tomorrow for recheck/reevaluation.

## 2018-02-18 NOTE — ED Provider Notes (Signed)
[facility] Emergency Department Provider Note  Time seen: 4:25 PM  I have reviewed the triage vital signs and the nursing notes.   HISTORY  Chief Complaint Leg Swelling    HPI [name] is a 84 y.o. female with a past medical history of CHF, depression, diabetes, hypertension, gastric reflux, CVA, presents to the emergency department for increased lower extremity swelling.  According to the patient and her granddaughter the patient has a history of congestive heart failure takes 20 mg of Lasix daily.  They state over the past 3 days the patient has had increased lower extremity swelling and they have doubled the patient's Lasix from 20 mg daily to 40 mg daily.  They state the patient continues to have lower extremity swelling and is now having some shortness of breath especially when she tries to lie down and is having to sit in a recliner.  Patient denies any chest pain, denies any shortness of breath currently.   Past Medical History:  Diagnosis Date  . Anemia   . Carotid arterial disease ([place])    a. 08/2014 U/S: Bilateral < 50% stenosis. Patent vertebrals w/ antegrade flow.   . Cervical spine arthritis   . CHF (congestive heart failure) ([place])   . Chronic diastolic CHF (congestive heart failure) ([place])    a. echo 2014: EF 55-60%, DD;  b. 08/2014 Echo: EF 55-60%, no RWMA, GR1DD; c. 02/2016 Echo: EF 65-70%, Gr1 DD.  Marland Kitchen Cognitive impairment   . Depression   . Diabetes mellitus without complication ([place])   . Essential hypertension   . GERD (gastroesophageal reflux disease)   . Glaucoma   . Gout   . History of renal impairment   . Hyperlipidemia   . Hypotension    a. Related to Norvasc - 11/2014 ED visit.  . Multinodular goiter    a. Noted incidentally on CT 07/2012 and carotid U/S 08/2014;  b. Nl TSH 11/2014.  Marland Kitchen Paroxysmal SVT (supraventricular tachycardia) (HCC)    a. on Toprol   . Prolapsed uterus   . Severe aortic stenosis    a. 11/15/16: s/p  TAVR Edwards Sapien 3 THV (size 23 mm, model # 9600TFX, serial # [id number])  . Stroke [facility])    a. CT head 07/2014 showed small thalamic infarct  . Vitamin deficiency     Patient Active Problem List   Diagnosis Date Noted  . Rectal prolapse 12/01/2017  . CHF (congestive heart failure) ([place]) 04/11/2017  . Other insomnia 01/02/2017  . Sedative hypnotic or anxiolytic dependence ([place]) 01/02/2017  . Severe aortic stenosis 11/15/2016  . Cognitive impairment 09/26/2016  . Gout 09/26/2016  . Hiatal hernia with gastroesophageal reflux 09/26/2016  . Lumbar and sacral spondylarthritis 09/26/2016  . Multiple lacunar infarcts 09/26/2016  . Multiple pulmonary nodules 09/26/2016  . Obesity 09/26/2016  . Recurrent UTI 09/26/2016  . Shoulder pain, bilateral 09/26/2016  . Trigeminal neuralgia 09/26/2016  . Urinary leakage 09/26/2016  . Uterine prolapse 09/26/2016  . Vitamin D deficiency 09/26/2016  . Aortic stenosis 05/03/2016  . Sepsis ([place])   . Osteoporosis 10/29/2015  . Hypertension   . Hyperlipidemia   . PSVT (paroxysmal supraventricular tachycardia) ([place])   . Multiple thyroid nodules   . Carotid arterial disease ([place])   . Sleepiness 12/25/2014  . Renal insufficiency 08/28/2014  . Hydronephrosis, bilateral 08/28/2014  . Hammer toe of left foot 03/10/2014  . Hammer toe of right foot 03/10/2014  . Onychogryphosis 03/10/2014  . Orofacial dyskinesia 02/28/2013  . Type  2 diabetes mellitus without complication, without long-term current use of insulin ([place]) 12/17/2012  . SOB (shortness of breath) 12/17/2012  . Chronic diastolic CHF (congestive heart failure) ([place]) 12/17/2012  . Hypomagnesemia 09/22/2012  . Cervical spine arthritis 06/29/2010    Past Surgical History:  Procedure Laterality Date  . EYE SURGERY     unsure of what procedure, did know that laser was used  . IR RADIOLOGY PERIPHERAL GUIDED IV START  11/03/2016  . IR US GUIDE VASC ACCESS RIGHT  11/03/2016  . RIGHT/LEFT HEART CATH  AND CORONARY ANGIOGRAPHY N/A 10/26/2016   Procedure: RIGHT/LEFT HEART CATH AND CORONARY ANGIOGRAPHY;  Surgeon: [name], MD;  Location: [place] [facility];  Service: Cardiovascular;  Laterality: N/A;  . TEE WITHOUT CARDIOVERSION N/A 03/31/2016   Procedure: TRANSESOPHAGEAL ECHOCARDIOGRAM (TEE);  Surgeon: Minna Merritts, MD;  Location: [facility] ORS;  Service: Cardiovascular;  Laterality: N/A;  . TEE WITHOUT CARDIOVERSION N/A 11/15/2016   Procedure: TRANSESOPHAGEAL ECHOCARDIOGRAM (TEE);  Surgeon: [name], MD;  Location: [place];  Service: Open Heart Surgery;  Laterality: N/A;  . TRANSCATHETER AORTIC VALVE REPLACEMENT, TRANSFEMORAL N/A 11/15/2016   Procedure: TRANSCATHETER AORTIC VALVE REPLACEMENT, TRANSFEMORAL;  Surgeon: [name], MD;  Location: [place];  Service: Open Heart Surgery;  Laterality: N/A;  . TUBAL LIGATION      Prior to Admission medications   Medication Sig Start Date End Date Taking? Authorizing Provider  ACCU-CHEK AVIVA PLUS test strip USE 1 STRIP TO TEST BLOOD SUGARS 3 TIMES A DAY 12/09/16   [provider]  acetaminophen (TYLENOL) 500 MG tablet Take 500 mg by mouth every 6 (six) hours as needed for mild pain or headache.    [provider]  allopurinol (ZYLOPRIM) 100 MG tablet Take 100 mg by mouth daily.    [provider]  Amino Acids-Protein Hydrolys (FEEDING SUPPLEMENT, PRO-STAT SUGAR FREE 64,) LIQD Take 30 mLs by mouth 3 (three) times daily with meals.    [provider]  Ampicillin Sodium 10 g SOLR ampicillin 10 gram solution for injection    [provider]  aspirin 81 MG chewable tablet Chew 81 mg by mouth daily.    [provider]  blood glucose meter kit and supplies Dispense based on patient and insurance preference. Use 1x daily as directed. (FOR ICD-10 E10.9, E11.9). 11/30/17   McLean-Scocuzza, Nino Glow, MD  brimonidine (ALPHAGAN P) 0.1 % SOLN Place 1 drop into the right eye 2 (two) times daily.    [provider]  cetirizine (ZYRTEC) 10 MG tablet Take 1/2 tablet (5 mg) by mouth once daily for allergies 07/10/17   Minna Merritts, MD  cholecalciferol (VITAMIN D) 1000 units tablet Take 1,000 Units by mouth daily.    [provider]  clotrimazole (LOTRIMIN) 1 % cream APPLY TOPICALLY TO THE AFFECTED AREA ONCE A DAY AT BEDTIME FOR 7-14 DAYS 12/15/17   [provider]  Dorzolamide HCl-Timolol Mal (COSOPT OP) Place 1 drop into the right eye 2 (two) times daily.     [provider]  feeding supplement, ENSURE ENLIVE, (ENSURE ENLIVE) LIQD Take 237 mLs by mouth 2 (two) times daily between meals. 04/01/16   Fritzi Mandes, MD  fluticasone (FLONASE) 50 MCG/ACT nasal spray Place 1 spray into both nostrils 2 (two) times daily. 03/22/17   [provider]  furosemide (LASIX) 20 MG tablet Take 1 tablet (20 mg total) by mouth daily. Take an additional 58m if weight >164lbs 11/15/17 11/15/18  GMinna Merritts MD  gabapentin (NEURONTIN) 300 MG capsule Take 300 mg by mouth 2 (two) times daily.    [provider]  glucose blood test strip Accu-Chek Aviva Plus test strips    [provider]  hydrALAZINE (APRESOLINE) 10 MG tablet Take 0.5 tablets (5 mg total) by mouth 3 (three) times daily. Please hold the hydralazine for systolic pressure <742 And for Diastolic pressure <59 5/63/87   Minna Merritts, MD  HYDROcodone-acetaminophen (NORCO/VICODIN) 5-325 MG tablet Take 1 tablet by mouth every 6 (six) hours as needed for moderate pain. 09/02/16   Baxter Hire, MD  Infant [employer] Products [facility]) CREA Apply topically.    [provider]  iron polysaccharides (NIFEREX) 150 MG capsule Take 1 capsule (150 mg total) by mouth 2 (two) times daily. 11/30/17   McLean-Scocuzza, Nino Glow, MD  isosorbide mononitrate (IMDUR) 30 MG 24 hr tablet TAKE 1 TABLET BY MOUTH EVERY DAY 10/25/17   Minna Merritts, MD  latanoprost (XALATAN) 0.005 % ophthalmic solution latanoprost  0.005 % eye drops    [provider]  lidocaine (LIDODERM) 5 % Place onto the skin. 04/01/16   [provider]  lisinopril (PRINIVIL,ZESTRIL) 10 MG tablet Take 10 mg by mouth daily. HOLD FOR BLOOD PRESSURE LOWER THAN 90/60    [provider]  LORazepam (ATIVAN) 0.5 MG tablet Take 0.5 mg by mouth every 12 (twelve) hours. 06/19/17   [provider]  lovastatin (MEVACOR) 40 MG tablet Take 40 mg by mouth at bedtime.    [provider]  metoprolol succinate (TOPROL-XL) 25 MG 24 hr tablet Take 25 mg by mouth daily.    [provider]  Multiple Vitamin (MULTI-VITAMIN PO) Take 1 tablet by mouth daily.    [provider]  Jonetta Speak LANCETS 56E MISC USE TO TEST BLOOD SUGAR ONCE A DAY 11/30/17   [provider]  pantoprazole (PROTONIX) 40 MG tablet Take 40 mg by mouth daily.    [provider]  pilocarpine (PILOCAR) 4 % ophthalmic solution Place 1 drop into the right eye 4 (four) times daily.     [provider]  polyethylene glycol (MIRALAX) packet Take 17 g by mouth daily. 04/02/17   Schuyler Amor, MD  potassium chloride (K-DUR) 10 MEQ tablet TAKE 1 TABLET BY MOUTH EVERY DAY TAKE WITH LASIX 10/31/17   Minna Merritts, MD  senna-docusate (SENOKOT-S) 8.6-50 MG tablet Take 2 tablets by mouth daily as needed for mild constipation.     [provider]  sertraline (ZOLOFT) 50 MG tablet Take 1 tablet (50 mg total) by mouth daily. 02/08/18   McLean-Scocuzza, Nino Glow, MD  sitaGLIPtin (JANUVIA) 50 MG tablet Take 1 tablet (50 mg total) by mouth daily. 11/30/17   McLean-Scocuzza, Nino Glow, MD  traZODone (DESYREL) 100 MG tablet Take 100 mg by mouth at bedtime.    [provider]  vitamin C (ASCORBIC ACID) 500 MG tablet Take 500 mg by mouth daily.    [provider]  Zinc Oxide 10 % OINT Apply 1 application topically daily as needed (rash). Applied to patient's bottom    [provider]     Allergies  Allergen Reactions  . Metformin Hcl Other (See Comments)    GI upset  . Metformin Other (See Comments), Diarrhea and Nausea And Vomiting  . Metformin And Related Diarrhea    Family History  Problem Relation Age of Onset  . Glaucoma Mother   . Diabetes Mother   . Peptic Ulcer Father   .  Colitis Father     Social History Social History   Tobacco Use  . Smoking status: Never Smoker  . Smokeless tobacco: Never Used  Substance Use Topics  . Alcohol use: No  . Drug use: No    Review of Systems Constitutional: Negative for fever. Cardiovascular: Negative for chest pain. Respiratory: Positive for shortness of breath when lying flat Gastrointestinal: Negative for abdominal pain, vomiting and diarrhea. Genitourinary: Negative for urinary compaints Musculoskeletal: Positive for lower extremity swelling bilaterally Skin: Negative for skin complaints  Neurological: Negative for headache All other ROS negative  ____________________________________________   PHYSICAL EXAM:  VITAL SIGNS: ED Triage Vitals [02/18/18 1552]  Enc Vitals Group     BP (!) 141/55     Pulse Rate 80     Resp 20     Temp 97.9 F (36.6 C)     Temp Source Oral     SpO2 96 %     Weight 180 lb (81.6 kg)     Height 5' (1.524 m)     Head Circumference      Peak Flow      Pain Score 0     Pain Loc      Pain Edu?      Excl. in [place]?    Constitutional: Alert and oriented. Well appearing and in no distress. Eyes: Normal exam ENT   Head: Normocephalic and atraumatic.   Mouth/Throat: Mucous membranes are moist. Cardiovascular: Normal rate, regular rhythm. Respiratory: Normal respiratory effort without tachypnea nor retractions. Breath sounds are clear Gastrointestinal: Soft and nontender. No distention.  Musculoskeletal: Moderate lower extremity edema 2+ bilaterally.  Pitting. Neurologic:  Normal speech and language. No gross focal neurologic deficits Skin:  Skin is warm, dry and  intact.  Psychiatric: Mood and affect are normal.   ____________________________________________    EKG  EKG viewed and interpreted by myself shows a sinus rhythm at 74 bpm with a widened QRS, left axis deviation, morphology consistent with left bundle branch block with nonspecific ST changes.  History of left bundle branch block per record review.  ____________________________________________    RADIOLOGY  Chest x-ray is clear.  ____________________________________________   INITIAL IMPRESSION / ASSESSMENT AND PLAN / ED COURSE  Pertinent labs & imaging results that were available during my care of the patient were reviewed by me and considered in my medical decision making (see chart for details).  Patient presents to the emergency department for increasing bilateral lower extremity edema now with shortness of breath when she lies flat.  Currently the patient appears well, no distress, satting 96% on room air.  We will check labs including cardiac enzymes, EKG and chest x-ray.  We will continue to closely monitor.  Once labs are known we will dose IV Lasix in the emergency department and decide upon disposition at that time.  Patient's work-up is largely at her baseline.  Patient's chest x-ray is clear, no pulmonary edema.  Cardiac enzymes are negative.  EKG unchanged from prior.  We will start the patient on IV Lasix in the emergency department.  Given a reassuring work-up with a 96 to 98% room air saturation we will discharge the patient home with increased Lasix for the next 5 days.  We will increase to 60 mg daily x5 days.  The patient will follow-up with her cardiologist.  ____________________________________________   FINAL CLINICAL IMPRESSION(S) / ED DIAGNOSES  Lower extremity edema CHF exacerbation   Harvest Dark, MD 02/18/18 1811

## 2018-02-18 NOTE — ED Notes (Signed)
Pt assisted to toilet in room with 2 person assist. Pt tolerated well

## 2018-02-18 NOTE — ED Notes (Signed)
Patient transported to CT 

## 2018-02-19 ENCOUNTER — Ambulatory Visit: Payer: PPO | Admitting: Internal Medicine

## 2018-02-19 ENCOUNTER — Telehealth: Payer: Self-pay | Admitting: Internal Medicine

## 2018-02-19 NOTE — Telephone Encounter (Signed)
I spoke with pt daughter she states pt was taken to the ER last night and she was told that pt needs to see her Cardiologist to follow up. Pt was given laxsis via IV and take 60 mg by mouth for five days and then follow up with cardio. Daughter would like to just keep the February appt. On 04/11/2018. Thank you!

## 2018-02-26 ENCOUNTER — Telehealth: Payer: Self-pay | Admitting: Cardiovascular Disease

## 2018-02-26 NOTE — Telephone Encounter (Signed)
Pt recently seen in the ED for periferal edema and wt gain.  She was given a 5 day dose of Lasix (60 mg daily total)  that ended on Friday.  Since Friday she is taking 40 mg total, as indicated on her prescription for weight > 164 lbs.  Per daughter she was 174 lbs yesterday and is 176 lbs today.  Family report that she has been eating inc salt over the holidays and is likely contributing to her weight gain. Daughter wants to know if 40 mg is enough Lasix given her current weight.  I encouraged them to get her diet back on track and will forward message to provider for further advice.    She does have upcoming appt on next Monday.

## 2018-02-26 NOTE — Telephone Encounter (Signed)
Pt c/o medication issue:  1. Name of Medication: furosemide 60 mg po q d x 5 days per [facility] ED   2. How are you currently taking this medication (dosage and times per day)?   3. Are you having a reaction (difficulty breathing--STAT)? no  4. What is your medication issue? Patient daughter says 5 days of increased lasix is up and wants to know what to do about dose until Monday appt here in clinic

## 2018-02-28 NOTE — Telephone Encounter (Signed)
Something does not add up Weight when I saw her in July 2019, was 157 now weight in the emergency room 180 Chest x-ray is clear  BNP is low suggesting no CHF Renal function actually suggesting she is prerenal/dehydrated Recent echocardiogram several months ago with minimally elevated right heart pressures  There is concern this may be weight gain from high carbohydrate intake, dietary Shortness of breath from dramatic weight gain, again dietary  Would be very cautious with the diuretics If she has swelling consider compression hose and weight loss Needs to walk more for conditioning

## 2018-03-01 NOTE — Telephone Encounter (Signed)
LMTCB w/ suggestions from provider.

## 2018-03-02 ENCOUNTER — Telehealth: Payer: Self-pay | Admitting: *Deleted

## 2018-03-02 ENCOUNTER — Encounter: Payer: Self-pay | Admitting: Family Medicine

## 2018-03-02 ENCOUNTER — Encounter

## 2018-03-02 ENCOUNTER — Telehealth: Payer: Self-pay

## 2018-03-02 ENCOUNTER — Ambulatory Visit (INDEPENDENT_AMBULATORY_CARE_PROVIDER_SITE_OTHER): Payer: PPO | Admitting: Family Medicine

## 2018-03-02 VITALS — BP 148/76 | HR 77 | Temp 98.5°F | Ht 63.0 in | Wt 178.8 lb

## 2018-03-02 DIAGNOSIS — R234 Changes in skin texture: Secondary | ICD-10-CM

## 2018-03-02 DIAGNOSIS — L98491 Non-pressure chronic ulcer of skin of other sites limited to breakdown of skin: Secondary | ICD-10-CM

## 2018-03-02 MED ORDER — DIMETHICONE 1 % EX CREA: TOPICAL_CREAM | CUTANEOUS | 0 refills | Status: DC

## 2018-03-02 NOTE — Telephone Encounter (Signed)
Spoke with Daughter Percell Locus on a few occassions since February 01, 2018 in attempts to get palliative care consult scheduled.  Talked with daughter again today and she reports they have not decided yet if they are wanting the consult.  They will contact our agency or the doctor when they get together as a family and make th decision. Referral cancelled

## 2018-03-02 NOTE — Telephone Encounter (Signed)
Copied from [place] [phone]. Topic: General - Other >> Mar 02, 2018  4:32 PM Bea Graff, NT wrote: Reason for CRM: Langley Gauss with [facility] calling to state the family of the pt is not in agreements for the referral for palliative care and will call them when they are. CB#: [phone]

## 2018-03-02 NOTE — Progress Notes (Signed)
Subjective:    Patient ID: [name], female    DOB: 09/06/1924, 85 y.o.   MRN: [id number]  HPI   Patient presents to clinic with daughter due to a ulcer on her buttock.  Daughter states she noticed it about a week ago, and has been putting Neosporin ointment on the area.  States after doing this over the past 5 days she has noticed the areas have greatly improved.  Patient has had no fever or chills, appetite is at her baseline.  No issues with urinating or bowel movements.  Patient does sit or lay down often throughout the day.   Patient Active Problem List   Diagnosis Date Noted  . Rectal prolapse 12/01/2017  . CHF (congestive heart failure) ([place]) 04/11/2017  . Other insomnia 01/02/2017  . Sedative hypnotic or anxiolytic dependence ([place]) 01/02/2017  . Severe aortic stenosis 11/15/2016  . Cognitive impairment 09/26/2016  . Gout 09/26/2016  . Hiatal hernia with gastroesophageal reflux 09/26/2016  . Lumbar and sacral spondylarthritis 09/26/2016  . Multiple lacunar infarcts 09/26/2016  . Multiple pulmonary nodules 09/26/2016  . Obesity 09/26/2016  . Recurrent UTI 09/26/2016  . Shoulder pain, bilateral 09/26/2016  . Trigeminal neuralgia 09/26/2016  . Urinary leakage 09/26/2016  . Uterine prolapse 09/26/2016  . Vitamin D deficiency 09/26/2016  . Aortic stenosis 05/03/2016  . Sepsis ([place])   . Osteoporosis 10/29/2015  . Hypertension   . Hyperlipidemia   . PSVT (paroxysmal supraventricular tachycardia) ([place])   . Multiple thyroid nodules   . Carotid arterial disease ([place])   . Sleepiness 12/25/2014  . Renal insufficiency 08/28/2014  . Hydronephrosis, bilateral 08/28/2014  . Hammer toe of left foot 03/10/2014  . Hammer toe of right foot 03/10/2014  . Onychogryphosis 03/10/2014  . Orofacial dyskinesia 02/28/2013  . Type 2 diabetes mellitus without complication, without long-term current use of insulin ([place]) 12/17/2012  . SOB (shortness of breath) 12/17/2012  .  Chronic diastolic CHF (congestive heart failure) ([place]) 12/17/2012  . Hypomagnesemia 09/22/2012  . Cervical spine arthritis 06/29/2010   Social History   Tobacco Use  . Smoking status: Never Smoker  . Smokeless tobacco: Never Used  Substance Use Topics  . Alcohol use: No   Review of Systems    Constitutional: Negative for chills, fatigue and fever.  HENT: Negative for congestion, ear pain, sinus pain and sore throat.   Eyes: Negative.   Respiratory: Negative for cough, shortness of breath and wheezing.   Cardiovascular: Negative for chest pain, palpitations and leg swelling.  Gastrointestinal: Negative for abdominal pain, diarrhea, nausea and vomiting.  Genitourinary: Negative for dysuria, frequency and urgency.  Musculoskeletal: Negative for arthralgias and myalgias.  Skin: small ulcers on bilateral buttock.   Neurological: Negative for syncope, light-headedness and headaches.  Psychiatric/Behavioral: The patient is not nervous/anxious.       Objective:   Physical Exam Vitals signs and nursing note reviewed.  Constitutional:      General: She is not in acute distress.    Appearance: She is not toxic-appearing.  HENT:     Head: Normocephalic and atraumatic.  Eyes:     General: No scleral icterus.    Conjunctiva/sclera: Conjunctivae normal.  Neck:     Musculoskeletal: Neck supple.  Cardiovascular:     Rate and Rhythm: Normal rate and regular rhythm.  Pulmonary:     Effort: Pulmonary effort is normal. No respiratory distress.     Breath sounds: Normal breath sounds.  Skin:    General: Skin is warm and  dry.          Comments: 2 small healing ulcer areas on bilateral buttock indicated by the red marks on diagram.  Areas are not deep, they are not bleeding, no abnormal discharge.  Neurological:     Mental Status: She is alert. Mental status is at baseline.  Psychiatric:        Mood and Affect: Mood normal.        Behavior: Behavior normal.       Vitals:   03/02/18  1501  BP: (!) 148/76  Pulse: 77  Temp: 98.5 F (36.9 C)  SpO2: 96%    Assessment & Plan:   Thin skin, skin ulcer- areas have improved from daughter placing Neosporin ointment.  Patient given prescription for Proshield skin protective, this creates a protective layer of skin it is good for the buttock area since patient does sit or lay often throughout the day and also wears adult diaper.  Also advised the daughter to try it put a pillow underneath patient's bottom when she is sitting or laying offset to either the right or left side, and switching the side of pillow every 2 hours to help better distribute weight and offload pressure -offloading the pressure on the skin helps prevent future skin breakdown.  Patient will keep regularly scheduled follow-up with PCP as planned.  Return to clinic sooner if any issues arise.

## 2018-03-02 NOTE — Telephone Encounter (Signed)
Call from daughter, gave her information from Dr. Rockey Situ. She verbalized understanding. She is going to bring pt on Monday to see Christell Faith for continued LE swelling.  Advised pt to call for any further questions or concerns

## 2018-03-02 NOTE — Telephone Encounter (Signed)
I did not place referral to hospice   [place]

## 2018-03-03 ENCOUNTER — Encounter: Payer: Self-pay | Admitting: Physician Assistant

## 2018-03-03 NOTE — Progress Notes (Signed)
Cardiology Office Note Date:  03/05/2018  Patient ID:  [name], [name] Sep 07, 1924, MRN [id number] PCP:  McLean-Scocuzza, Nino Glow, MD  Cardiologist:  Dr. Rockey Situ, MD    Chief Complaint: ED follow up  History of Present Illness: [name] is a 85 y.o. female with history of nonobstructive CAD, chronic diastolic CHF, pulmonary hypertension, severe aortic stenosis s/p TAVR on 11/15/2016, stroke in 2016, SVT, anemia, falls, DM2, and HTN who presents for ED follow up of lower extremity swelling.   Echo in 02/2016 showed an EF of 65-70%, moderate LVH, no RWMA, Gr1DD, severe AS with mild to moderate AI, mild MR, mildly dilated LA, RVSF normal, PASP 75 mmHg. In this setting, she was experiencing progressive DOE with NYHA class III symptoms of HFpEF. She underwent R/LHC in 09/2016 that showed nonobstructive CAD with distal LAD 50% stenosis and mid RCA 40% stenosis with severe rPDA 80% stenosis with medical management advised. Following this, she underwent TAVR with a 23 mm Edwards Sapien aortic valve on 11/15/2016. EKG at that time showed a new LBBB, without high grade AV block. Post TAVR echo on 11/16/2016 showed an EF of 60-65%, sever focal basal and moderate concentric LVH, no RWMA, Gr2DD, moderate central AI, mild MR, PASP 66 mmHg. 1 month post procedure echo showed a slightly lower EF of 40-45% with LV dyssynchrony from her LBBB, mild concentric LVH, Gr1DD, with a normal functioning TAVR valve with mild regurgitation, moderately dilated LA, PASP 42 mmHg, trivial perocardial effusion. Most recent post-TAVR echo from 10/2017 showed moderate LVH, Gr1DD, normal functioing bioprosthetic aortic valve, PASP 37 mmHg.   She was seen in the ED on 02/18/2018 with increased lower extremity swelling with the patient doubling her Lasix at home from 20 mg daily to 40 mg daily for several days prior to her ED evaluation. Documented weight in the ED was 180 pounds. CXR showed cardiomegaly with no  active cardiopulmonary disease. Labs showed a WBC of 10.7, HGB 11.0, PLT 178, K+ 4.6, BUN 37, SCr 1.47 with a baseline of 0.7, albumin 3.9, troponin < 0.03, BNP 68. EKG showed NSR with LBBB. She was given IV Lasix in the ED and advised to take increased Lasix of 60 mg daily x 5 days and follow up with cardiology.   It was noted by the patient's primary cardiologist, in telephone follow up, that the patient weighed 157 pounds in 08/2017 cardiology follow up and 163 pounds in 10/2017 at her TAVR follow up; however, her weight was documented to be up to 180 pounds in the ED with a normal CXR, normal BNP and labs suggestive of pre-renal state.   Office visit with PCP on 03/02/2018 noted a weight of 178 pounds.   Patient comes in accompanied by her daughter today who provides most of the history however, the patient is able to provide some details.  Patient's daughter indicates that since about mid December, 2019 the patient has had increased lower extremity swelling with associated weight gain of at least 23 pounds.  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  In this setting, the patient was snacking more, eating saltier foods, and drinking more sodas.  Patient's daughter does report the patient continues to want salt applied to her food especially her seafood.  The patient's friends were bringing in foods high in salt including  fat back.  Patient's daughter was initially trying to diurese at home by taking 20 mg of Lasix twice daily however she was not noticing much improvement prompting her to go to the ED as outlined above.  Following outpatient diuresis as directed by the ED of 60 mg Lasix daily for 5 days the patient's daughter has not noticed significant improvement in the patient's weight or lower extremity swelling.  The patient's daughter feels like the  patient's breathing is more labored when she is laying supine.  She has not reported shortness of breath though the patient's daughter feels like the patient is having to work harder when laying down.  More recently, the patient has been sleeping in a recliner secondary to orthopnea.  She is wearing compression stockings though does have her feet hanging down in chairs often.  Patient reports she currently does not feel short of breath.  Never with chest pain throughout any of the above or palpitations.  No dizziness, presyncope, or syncope.   Past Medical History:  Diagnosis Date  . Anemia   . Carotid arterial disease ([place])    a. 08/2014 U/S: Bilateral < 50% stenosis. Patent vertebrals w/ antegrade flow.   . Cervical spine arthritis   . Chronic diastolic CHF (congestive heart failure) ([place])    a. echo 2014: EF 55-60%, DD;  b. 08/2014 Echo: EF 55-60%, no RWMA, GR1DD; c. 02/2016 Echo: EF 65-70%, Gr1 DD.  Marland Kitchen Cognitive impairment   . Depression   . Diabetes mellitus without complication ([place])   . Essential hypertension   . GERD (gastroesophageal reflux disease)   . Glaucoma   . Gout   . History of renal impairment   . Hyperlipidemia   . Hypotension    a. Related to Norvasc - 11/2014 ED visit.  . Multinodular goiter    a. Noted incidentally on CT 07/2012 and carotid U/S 08/2014;  b. Nl TSH 11/2014.  Marland Kitchen Paroxysmal SVT (supraventricular tachycardia) ([facility])    a. on Toprol   . Prolapsed uterus   . Severe aortic stenosis    a. 11/15/16: s/p TAVR Edwards Sapien 3 THV (size 23 mm, model # 9600TFX, serial # [id number])  . Stroke [facility])    a. CT head 07/2014 showed small thalamic infarct  . Vitamin deficiency     Past Surgical History:  Procedure Laterality Date  . EYE SURGERY     unsure of what procedure, did know that laser was used  . IR RADIOLOGY PERIPHERAL GUIDED IV START  11/03/2016  . IR US GUIDE VASC ACCESS RIGHT  11/03/2016  . RIGHT/LEFT HEART CATH AND CORONARY ANGIOGRAPHY N/A 10/26/2016    Procedure: RIGHT/LEFT HEART CATH AND CORONARY ANGIOGRAPHY;  Surgeon: [name], MD;  Location: [place] [facility];  Service: Cardiovascular;  Laterality: N/A;  . TEE WITHOUT CARDIOVERSION N/A 03/31/2016   Procedure: TRANSESOPHAGEAL ECHOCARDIOGRAM (TEE);  Surgeon: Minna Merritts, MD;  Location: [facility];  Service: Cardiovascular;  Laterality: N/A;  . TEE WITHOUT CARDIOVERSION N/A 11/15/2016   Procedure: TRANSESOPHAGEAL ECHOCARDIOGRAM (TEE);  Surgeon: [name], MD;  Location: [place];  Service: Open Heart Surgery;  Laterality: N/A;  . TRANSCATHETER AORTIC VALVE REPLACEMENT, TRANSFEMORAL N/A 11/15/2016   Procedure: TRANSCATHETER AORTIC VALVE REPLACEMENT, TRANSFEMORAL;  Surgeon: [name], MD;  Location: [place];  Service: Open Heart Surgery;  Laterality: N/A;  . TUBAL LIGATION      Current Meds  Medication Sig  . ACCU-CHEK AVIVA PLUS test strip USE 1 STRIP TO TEST BLOOD SUGARS 3  TIMES A DAY  . acetaminophen (TYLENOL) 500 MG tablet Take 500 mg by mouth every 6 (six) hours as needed for mild pain or headache.  . allopurinol (ZYLOPRIM) 100 MG tablet Take 100 mg by mouth daily.  . Amino Acids-Protein Hydrolys (FEEDING SUPPLEMENT, PRO-STAT SUGAR FREE 64,) LIQD Take 30 mLs by mouth 3 (three) times daily with meals.  . Ampicillin Sodium 10 g SOLR ampicillin 10 gram solution for injection  . aspirin 81 MG chewable tablet Chew 81 mg by mouth daily.  . blood glucose meter kit and supplies Dispense based on patient and insurance preference. Use 1x daily as directed. (FOR ICD-10 E10.9, E11.9).  . brimonidine (ALPHAGAN P) 0.1 % SOLN Place 1 drop into the right eye 2 (two) times daily.  . cetirizine (ZYRTEC) 10 MG tablet Take 1/2 tablet (5 mg) by mouth once daily for allergies  . cholecalciferol (VITAMIN D) 1000 units tablet Take 1,000 Units by mouth daily.  . clotrimazole (LOTRIMIN) 1 % cream APPLY TOPICALLY TO THE AFFECTED AREA ONCE A DAY AT BEDTIME FOR 7-14 DAYS  . dimethicone (PROSHIELD PLUS  SKIN PROTECTANT) 1 % cream 1 application to the buttock 2 times daily to help protect skin  . Dorzolamide HCl-Timolol Mal (COSOPT OP) Place 1 drop into the right eye 2 (two) times daily.   . feeding supplement, ENSURE ENLIVE, (ENSURE ENLIVE) LIQD Take 237 mLs by mouth 2 (two) times daily between meals.  . fluticasone (FLONASE) 50 MCG/ACT nasal spray Place 1 spray into both nostrils 2 (two) times daily.  . furosemide (LASIX) 20 MG tablet Take 1 tablet (20 mg total) by mouth daily. Take an additional 65m if weight >164lbs  . gabapentin (NEURONTIN) 300 MG capsule Take 300 mg by mouth 2 (two) times daily.  .Marland Kitchenglucose blood test strip Accu-Chek Aviva Plus test strips  . hydrALAZINE (APRESOLINE) 10 MG tablet Take 0.5 tablets (5 mg total) by mouth 3 (three) times daily. Please hold the hydralazine for systolic pressure <<478And for Diastolic pressure <<29 . HYDROcodone-acetaminophen (NORCO/VICODIN) 5-325 MG tablet Take 1 tablet by mouth every 6 (six) hours as needed for moderate pain.  . Infant Care Products (DERMACLOUD) CREA Apply topically.  . iron polysaccharides (NIFEREX) 150 MG capsule Take 1 capsule (150 mg total) by mouth 2 (two) times daily.  . isosorbide mononitrate (IMDUR) 30 MG 24 hr tablet TAKE 1 TABLET BY MOUTH EVERY DAY  . latanoprost (XALATAN) 0.005 % ophthalmic solution latanoprost 0.005 % eye drops  . lidocaine (LIDODERM) 5 % Place onto the skin.  .Marland Kitchenlisinopril (PRINIVIL,ZESTRIL) 10 MG tablet Take 10 mg by mouth daily. HOLD FOR BLOOD PRESSURE LOWER THAN 90/60  . LORazepam (ATIVAN) 0.5 MG tablet Take 0.5 mg by mouth every 12 (twelve) hours.  . lovastatin (MEVACOR) 40 MG tablet Take 40 mg by mouth at bedtime.  . metoprolol succinate (TOPROL-XL) 25 MG 24 hr tablet Take 25 mg by mouth daily.  . Multiple Vitamin (MULTI-VITAMIN PO) Take 1 tablet by mouth daily.  .Glory RosebushDELICA LANCETS 356OMISC USE TO TEST BLOOD SUGAR ONCE A DAY  . pantoprazole (PROTONIX) 40 MG tablet Take 40 mg by mouth  daily.  . pilocarpine (PILOCAR) 4 % ophthalmic solution Place 1 drop into the right eye 4 (four) times daily.   . polyethylene glycol (MIRALAX) packet Take 17 g by mouth daily.  . potassium chloride (K-DUR) 10 MEQ tablet TAKE 1 TABLET BY MOUTH EVERY DAY TAKE WITH LASIX  . senna-docusate (SENOKOT-S) 8.6-50 MG tablet  Take 2 tablets by mouth daily as needed for mild constipation.   . sertraline (ZOLOFT) 50 MG tablet Take 1 tablet (50 mg total) by mouth daily.  . sitaGLIPtin (JANUVIA) 50 MG tablet Take 1 tablet (50 mg total) by mouth daily.  . traZODone (DESYREL) 100 MG tablet Take 100 mg by mouth at bedtime.  . vitamin C (ASCORBIC ACID) 500 MG tablet Take 500 mg by mouth daily.  . Zinc Oxide 10 % OINT Apply 1 application topically daily as needed (rash). Applied to patient's bottom    Allergies:   Metformin hcl; Metformin; and Metformin and related   Social History:  The patient  reports that she has never smoked. She has never used smokeless tobacco. She reports that she does not drink alcohol or use drugs.   Family History:  The patient's family history includes Colitis in her father; Diabetes in her mother; Glaucoma in her mother; Peptic Ulcer in her father.  ROS:   Review of Systems  Constitutional: Positive for malaise/fatigue. Negative for chills, diaphoresis, fever and weight loss.  HENT: Negative for congestion.   Eyes: Negative for discharge and redness.  Respiratory: Positive for shortness of breath. Negative for cough, hemoptysis, sputum production and wheezing.   Cardiovascular: Positive for leg swelling. Negative for chest pain, palpitations, orthopnea, claudication and PND.  Gastrointestinal: Negative for abdominal pain, blood in stool, heartburn, melena, nausea and vomiting.  Genitourinary: Negative for hematuria.  Musculoskeletal: Negative for falls and myalgias.  Skin: Negative for rash.  Neurological: Positive for weakness. Negative for dizziness, tingling, tremors,  sensory change, speech change, focal weakness and loss of consciousness.  Endo/Heme/Allergies: Does not bruise/bleed easily.  Psychiatric/Behavioral: Negative for substance abuse. The patient is not nervous/anxious.   All other systems reviewed and are negative.    PHYSICAL EXAM:  VS:  BP 130/70 (BP Location: Right Arm, Patient Position: Sitting, Cuff Size: Normal)   Pulse 85   Ht 5' (1.524 m)   Wt 180 lb (81.6 kg)   BMI 35.15 kg/m  BMI: Body mass index is 35.15 kg/m.  Physical Exam  Constitutional: She is oriented to person, place, and time. She appears well-developed and well-nourished.  Elderly and chronically ill appearing.  HENT:  Head: Normocephalic and atraumatic.  Eyes: Right eye exhibits no discharge. Left eye exhibits no discharge.  Neck: Normal range of motion. No JVD present.  Cardiovascular: Normal rate, regular rhythm, S1 normal and S2 normal. Exam reveals no distant heart sounds, no friction rub, no midsystolic click and no opening snap.  Murmur heard. Pulses:      Posterior tibial pulses are 1+ on the right side and 1+ on the left side.  II/VI systolic murmur noted along the RUSB  Pulmonary/Chest: Effort normal and breath sounds normal. No respiratory distress. She has no decreased breath sounds. She has no wheezes. She has no rales. She exhibits no tenderness.  Abdominal: Soft. She exhibits no distension. There is no abdominal tenderness.  Musculoskeletal:        General: Edema present.     Comments: 1+ bilateral lower extremity pitting edema to the thighs  Neurological: She is alert and oriented to person, place, and time.  Skin: Skin is warm and dry. No cyanosis. Nails show no clubbing.  Psychiatric: She has a normal mood and affect. Her speech is normal and behavior is normal. Judgment and thought content normal.     EKG:  Was ordered and interpreted by me today. Shows NSR, 85 bpm, 1st degree AV  block, LBBB (known)  Recent Labs: 01/04/2018: Magnesium 2.1;  TSH 1.21 02/18/2018: ALT 14; B Natriuretic Peptide 68.0; BUN 37; Creatinine, Ser 1.47; Hemoglobin 11.0; Platelets 178; Potassium 4.6; Sodium 138  No results found for requested labs within last 8760 hours.   Estimated Creatinine Clearance: 23.6 mL/min (A) (by C-G formula based on SCr of 1.47 mg/dL (H)).   Wt Readings from Last 3 Encounters:  03/05/18 180 lb (81.6 kg)  03/02/18 178 lb 12.8 oz (81.1 kg)  02/18/18 180 lb (81.6 kg)     Other studies reviewed: Additional studies/records reviewed today include: summarized above  ASSESSMENT AND PLAN:  1. Lower extremity swelling/weight gain/acute on chronic diastolic CHF/pulmonary pretension: Interesting clinical presentation given the significant weight gain with associated lower extremity swelling and orthopnea in the setting of what appears to be dietary indiscretions as outlined above.  Cannot exclude some component of anemia however her labs do not support hypoalbuminemia or profound anemia leading to significant third spacing.  Check BMP and CBC to trend her renal function following outpatient diuresis as recommended by the ED.  Obtain echocardiogram to evaluate EF for possible low output heart failure leading to her AKI.  Cannot exclude vascular congestion leading her labs to have the appearance of possible prerenal state.  If renal function is significantly worsened she may require ED evaluation for likely admission.  If she is found to have a new on set cardiomyopathy would also require ED evaluation and admission for IV diuresis with likely inotropic support to augment diuresis.  If labs are stable to somewhat improved consider transition from Lasix to torsemide as this is absorbed better.  Advised patient's daughter to decrease salt and p.o. fluid intake.  2. Nonobstructive CAD: No symptoms suggestive of angina.  Remains on aspirin, Imdur, lisinopril, Toprol-XL.  No plans for further ischemic evaluation at this time.  3. Severe aortic  stenosis status post TAVR: Murmur noted on exam today.  Check echocardiogram as above.  Follow-up with structural heart clinic as recommended.  SBE prophylaxis.  4. Acute kidney injury: Labs from 12/22, would indicate a possible prerenal state however, the patient does appear volume overloaded.  Check BMP today.  5. Anemia: Most recent CBC from 02/18/2018 showed a low though stable hemoglobin of 11.0.  Check CBC.  Disposition: F/u with Dr. Rockey Situ or an APP in 1 week.   Current medicines are reviewed at length with the patient today.  The patient did not have any concerns regarding medicines.  Signed, Christell Faith, PA-C 03/05/2018 12:19 PM     [address] [place]  [place], [place] [address] [phone]

## 2018-03-05 ENCOUNTER — Encounter: Payer: Self-pay | Admitting: Physician Assistant

## 2018-03-05 ENCOUNTER — Ambulatory Visit: Payer: PPO | Admitting: Physician Assistant

## 2018-03-05 ENCOUNTER — Other Ambulatory Visit
Admission: RE | Admit: 2018-03-05 | Discharge: 2018-03-05 | Disposition: A | Discharge status: Home / Self Care | Payer: PPO | Source: Ambulatory Visit | Attending: Physician Assistant | Admitting: Physician Assistant

## 2018-03-05 ENCOUNTER — Telehealth: Payer: Self-pay

## 2018-03-05 VITALS — BP 130/70 | HR 85 | Ht 60.0 in | Wt 180.0 lb

## 2018-03-05 DIAGNOSIS — R635 Abnormal weight gain: Secondary | ICD-10-CM

## 2018-03-05 DIAGNOSIS — Z952 Presence of prosthetic heart valve: Secondary | ICD-10-CM

## 2018-03-05 DIAGNOSIS — I5033 Acute on chronic diastolic (congestive) heart failure: Secondary | ICD-10-CM | POA: Insufficient documentation

## 2018-03-05 DIAGNOSIS — I350 Nonrheumatic aortic (valve) stenosis: Secondary | ICD-10-CM | POA: Diagnosis not present

## 2018-03-05 DIAGNOSIS — D649 Anemia, unspecified: Secondary | ICD-10-CM

## 2018-03-05 DIAGNOSIS — I2510 Atherosclerotic heart disease of native coronary artery without angina pectoris: Secondary | ICD-10-CM

## 2018-03-05 DIAGNOSIS — R0600 Dyspnea, unspecified: Secondary | ICD-10-CM | POA: Diagnosis not present

## 2018-03-05 DIAGNOSIS — N179 Acute kidney failure, unspecified: Secondary | ICD-10-CM

## 2018-03-05 DIAGNOSIS — M7989 Other specified soft tissue disorders: Secondary | ICD-10-CM | POA: Diagnosis not present

## 2018-03-05 LAB — BASIC METABOLIC PANEL
ANION GAP: 8 (ref 5–15)
BUN: 58 mg/dL — ABNORMAL HIGH (ref 8–23)
CO2: 26 mmol/L (ref 22–32)
Calcium: 9.3 mg/dL (ref 8.9–10.3)
Chloride: 104 mmol/L (ref 98–111)
Creatinine, Ser: 1.4 mg/dL — ABNORMAL HIGH (ref 0.44–1.00)
GFR calc Af Amer: 38 mL/min — ABNORMAL LOW (ref >60)
GFR calc non Af Amer: 33 mL/min — ABNORMAL LOW (ref >60)
Glucose, Bld: 133 mg/dL — ABNORMAL HIGH (ref 70–99)
Potassium: 4.5 mmol/L (ref 3.5–5.1)
Sodium: 138 mmol/L (ref 135–145)

## 2018-03-05 LAB — CBC
HCT: 36.4 % (ref 36.0–46.0)
HEMOGLOBIN: 11.5 g/dL — AB (ref 12.0–15.0)
MCH: 30.2 pg (ref 26.0–34.0)
MCHC: 31.6 g/dL (ref 30.0–36.0)
MCV: 95.5 fL (ref 80.0–100.0)
Platelets: 168 10*3/uL (ref 150–400)
RBC: 3.81 MIL/uL — ABNORMAL LOW (ref 3.87–5.11)
RDW: 15.1 % (ref 11.5–15.5)
WBC: 9.6 10*3/uL (ref 4.0–10.5)
nRBC: 0 % (ref 0.0–0.2)

## 2018-03-05 MED ORDER — TORSEMIDE 20 MG PO TABS: 20.0000 mg | ORAL_TABLET | Freq: Every day | ORAL | 3 refills | Status: DC

## 2018-03-05 NOTE — Telephone Encounter (Signed)
Call attempted. No answer.

## 2018-03-05 NOTE — Telephone Encounter (Signed)
-----   Message from Rise Mu, PA-C sent at 03/05/2018  1:30 PM EST ----- Renal function continues to be elevated.  Please transition from Lasix to torsemide 20 mg daily.  Continue with planned echo.  Follow-up BMP in 1 week at her follow-up appointment.

## 2018-03-05 NOTE — Telephone Encounter (Signed)
Call to daughter, okay per DPR to relay lab results and recommendations from provider.  She verbalized understanding.  Torsemide e script sent to pt preferred pharm.  Advised pt to call for any further questions or concerns Pt has ECHO Friday and f/u in 1 weeks. famil verbalized understanding.   Advised pt to call for any further questions or concerns

## 2018-03-05 NOTE — Patient Instructions (Signed)
Medication Instructions:  Your physician recommends that you continue on your current medications as directed. Please refer to the Current Medication list given to you today.  If you need a refill on your cardiac medications before your next appointment, please call your pharmacy.   Lab work: Your physician recommends that you return for lab work today (BMET, CBC) If you have labs (blood work) drawn today and your tests are completely normal, you will receive your results only by: Marland Kitchen MyChart Message (if you have MyChart) OR . A paper copy in the mail If you have any lab test that is abnormal or we need to change your treatment, we will call you to review the results.  Testing/Procedures: 1- ECHO Your physician has requested that you have an echocardiogram. Echocardiography is a painless test that uses sound waves to create images of your heart. It provides your doctor with information about the size and shape of your heart and how well your heart's chambers and valves are working. This procedure takes approximately one hour. There are no restrictions for this procedure.    Follow-Up: At [facility], you and your health needs are our priority.  As part of our continuing mission to provide you with exceptional heart care, we have created designated Provider Care Teams.  These Care Teams include your primary Cardiologist (physician) and Advanced Practice Providers (APPs -  Physician Assistants and Nurse Practitioners) who all work together to provide you with the care you need, when you need it. You will need a follow up appointment in 1 weeks.  You may see Dr. Rockey Situ or one of the following Advanced Practice Providers on your designated Care Team:   Murray Hodgkins, NP Christell Faith, PA-C . Marrianne Mood, PA-C

## 2018-03-06 ENCOUNTER — Telehealth: Payer: Self-pay | Admitting: Physician Assistant

## 2018-03-06 ENCOUNTER — Other Ambulatory Visit: Payer: Self-pay | Admitting: Internal Medicine

## 2018-03-06 NOTE — Telephone Encounter (Signed)
Pt daughter is calling, asking should patient continue taking her potassium. Please call and advise

## 2018-03-06 NOTE — Telephone Encounter (Signed)
Potassium was on the high-end of normal.  In that setting, please have her hold potassium with torsemide until checked at her follow-up next week.

## 2018-03-06 NOTE — Telephone Encounter (Signed)
Spoke with daughter. She verbalized understanding to not take potassium until advised further at appointment next week. Med list updated.

## 2018-03-06 NOTE — Telephone Encounter (Signed)
S/w patient's daughter. She is aware that patient is to switch to torsemide in place of furosemide. She wanted to make sure patient is to continue potassium. Advised that I did not see any changes with potassium at this time but will verify with provider. Routing to [employer].

## 2018-03-09 ENCOUNTER — Ambulatory Visit (INDEPENDENT_AMBULATORY_CARE_PROVIDER_SITE_OTHER): Payer: PPO

## 2018-03-09 ENCOUNTER — Other Ambulatory Visit: Payer: Self-pay

## 2018-03-09 DIAGNOSIS — R0600 Dyspnea, unspecified: Secondary | ICD-10-CM

## 2018-03-09 NOTE — Progress Notes (Signed)
Cardiology Office Note Date:  03/13/2018  Patient ID:  Cassandra, Green 1925-01-25, MRN [id number] PCP:  McLean-Scocuzza, Nino Glow, MD  Cardiologist:  Dr. Rockey Situ, MD    Chief Complaint: Follow up  History of Present Illness: [name] is a 85 y.o. female with history of nonobstructive CAD, chronic diastolic CHF, pulmonary hypertension, severe aortic stenosis s/p TAVR on 11/15/2016, stroke in 2016, SVT, anemia, falls, DM2, and HTN who presents for ED follow up of lower extremity swelling.   Echo in 02/2016 showed an EF of 65-70%, moderate LVH, no RWMA, Gr1DD, severe AS with mild to moderate AI, mild MR, mildly dilated LA, RVSF normal, PASP 75 mmHg. In this setting, she was experiencing progressive DOE with NYHA class III symptoms of HFpEF. She underwent R/LHC in 09/2016 that showed nonobstructive CAD with distal LAD 50% stenosis and mid RCA 40% stenosis with severe rPDA 80% stenosis with medical management advised. Following this, she underwent TAVR with a 23 mm Edwards Sapien aortic valve on 11/15/2016. EKG at that time showed a new LBBB, without high grade AV block. Post TAVR echo on 11/16/2016 showed an EF of 60-65%, sever focal basal and moderate concentric LVH, no RWMA, Gr2DD, moderate central AI, mild MR, PASP 66 mmHg. 1 month post procedure echo showed a slightly lower EF of 40-45% with LV dyssynchrony from her LBBB, mild concentric LVH, Gr1DD, with a normal functioning TAVR valve with mild regurgitation, moderately dilated LA, PASP 42 mmHg, trivial perocardial effusion. Most recent post-TAVR echo from 10/2017 showed moderate LVH, Gr1DD, normal functioing bioprosthetic aortic valve, PASP 37 mmHg.   She was seen in the ED on 02/18/2018 with increased lower extremity swelling with the patient doubling her Lasix at home from 20 mg daily to 40 mg daily for several days prior to her ED evaluation. Documented weight in the ED was 180 pounds. CXR showed cardiomegaly with no  active cardiopulmonary disease. Labs showed a WBC of 10.7, HGB 11.0, PLT 178, K+ 4.6, BUN 37, SCr 1.47 with a baseline of 0.7, albumin 3.9, troponin < 0.03, BNP 68. EKG showed NSR with LBBB. She was given IV Lasix in the ED and advised to take increased Lasix of 60 mg daily x 5 days and follow up with cardiology.   It was noted by the patient's primary cardiologist, in telephone follow up, that the patient weighed 157 pounds in 08/2017 cardiology follow up and 163 pounds in 10/2017 at her TAVR follow up; however, her weight was documented to be up to 180 pounds in the ED with a normal CXR, normal BNP and labs suggestive of pre-renal state.   Office visit with PCP on 03/02/2018 noted a weight of 178 pounds.   She was last seen in the office on 03/05/2018, noting a weight gain of ~ 23 pounds and increased lower extremity swelling since mid 01/2018. These symptoms began when the daughter was having friends come into the house to assist in taking care of the patient. They were bringing in saltier foods and the patient was drinking more sodas. Weight was noted to be 180 pounds. Labs checked that day showed a HGB of 11.5, stable SCr of 1.40, BUN 58, K+ 4.5. She was changed from Lasix to torsemide. Echo from 03/09/2018 showed an EF of 50-55%, moderate concentric LVH, no RWMA, Gr2DD, bioprosthetic aortic valve was noted and functioning normally, calcified mitral annulus, mildly dilated left atrium, atrial septal aneurysm was noted, PASP 45 mmHg.   She comes  in accompanied by her daughter today who provides most of the history.  Patient's daughter indicates good/improved urine output with transition to torsemide.  Following transition to torsemide the patient's weight improved to 172 pounds however, with skipping a dose over the weekend followed by several salty meals the patient's weight has trended back up to 181 pounds.  She has also noted an improvement in lower extremity swelling and breathing.  The patient has  denied any chest pain, dizziness, presyncope, or syncope.  Improved orthopnea.  Typically on the weekends, she continues to be provided with salty foods.  She has not yet taken her torsemide for this morning.  Past Medical History:  Diagnosis Date  . Anemia   . Carotid arterial disease ([place])    a. 08/2014 U/S: Bilateral < 50% stenosis. Patent vertebrals w/ antegrade flow.   . Cervical spine arthritis   . Chronic diastolic CHF (congestive heart failure) ([place])    a. echo 2014: EF 55-60%, DD;  b. 08/2014 Echo: EF 55-60%, no RWMA, GR1DD; c. 02/2016 Echo: EF 65-70%, Gr1 DD.  Marland Kitchen Cognitive impairment   . Depression   . Diabetes mellitus without complication ([place])   . Essential hypertension   . GERD (gastroesophageal reflux disease)   . Glaucoma   . Gout   . History of renal impairment   . Hyperlipidemia   . Hypotension    a. Related to Norvasc - 11/2014 ED visit.  . Multinodular goiter    a. Noted incidentally on CT 07/2012 and carotid U/S 08/2014;  b. Nl TSH 11/2014.  Marland Kitchen Paroxysmal SVT (supraventricular tachycardia) (H[facility])    a. on Toprol   . Prolapsed uterus   . Severe aortic stenosis    a. 11/15/16: s/p TAVR Edwards Sapien 3 THV (size 23 mm, model # 9600TFX, serial # [id number])  . Stroke [facility])    a. CT head 07/2014 showed small thalamic infarct  . Vitamin deficiency     Past Surgical History:  Procedure Laterality Date  . EYE SURGERY     unsure of what procedure, did know that laser was used  . IR RADIOLOGY PERIPHERAL GUIDED IV START  11/03/2016  . IR US GUIDE VASC ACCESS RIGHT  11/03/2016  . RIGHT/LEFT HEART CATH AND CORONARY ANGIOGRAPHY N/A 10/26/2016   Procedure: RIGHT/LEFT HEART CATH AND CORONARY ANGIOGRAPHY;  Surgeon: [name], MD;  Location: [facility];  Service: Cardiovascular;  Laterality: N/A;  . TEE WITHOUT CARDIOVERSION N/A 03/31/2016   Procedure: TRANSESOPHAGEAL ECHOCARDIOGRAM (TEE);  Surgeon: Minna Merritts, MD;  Location: [facility];  Service: Cardiovascular;   Laterality: N/A;  . TEE WITHOUT CARDIOVERSION N/A 11/15/2016   Procedure: TRANSESOPHAGEAL ECHOCARDIOGRAM (TEE);  Surgeon: [name], MD;  Location: [place];  Service: Open Heart Surgery;  Laterality: N/A;  . TRANSCATHETER AORTIC VALVE REPLACEMENT, TRANSFEMORAL N/A 11/15/2016   Procedure: TRANSCATHETER AORTIC VALVE REPLACEMENT, TRANSFEMORAL;  Surgeon: [name], MD;  Location: [place];  Service: Open Heart Surgery;  Laterality: N/A;  . TUBAL LIGATION      Current Meds  Medication Sig  . ACCU-CHEK AVIVA PLUS test strip USE 1 STRIP TO TEST BLOOD SUGARS 3 TIMES A DAY  . acetaminophen (TYLENOL) 500 MG tablet Take 500 mg by mouth every 6 (six) hours as needed for mild pain or headache.  . allopurinol (ZYLOPRIM) 100 MG tablet Take 100 mg by mouth daily.  . Amino Acids-Protein Hydrolys (FEEDING SUPPLEMENT, PRO-STAT SUGAR FREE 64,) LIQD Take 30 mLs by mouth 3 (three) times daily with  meals.  . Ampicillin Sodium 10 g SOLR ampicillin 10 gram solution for injection  . aspirin 81 MG chewable tablet Chew 81 mg by mouth daily.  . blood glucose meter kit and supplies Dispense based on patient and insurance preference. Use 1x daily as directed. (FOR ICD-10 E10.9, E11.9).  . brimonidine (ALPHAGAN P) 0.1 % SOLN Place 1 drop into the right eye 2 (two) times daily.  . cholecalciferol (VITAMIN D) 1000 units tablet Take 1,000 Units by mouth daily.  . clotrimazole (LOTRIMIN) 1 % cream APPLY TOPICALLY TO THE AFFECTED AREA ONCE A DAY AT BEDTIME FOR 7-14 DAYS  . dimethicone (PROSHIELD PLUS SKIN PROTECTANT) 1 % cream 1 application to the buttock 2 times daily to help protect skin  . Dorzolamide HCl-Timolol Mal (COSOPT OP) Place 1 drop into the right eye 2 (two) times daily.   . feeding supplement, ENSURE ENLIVE, (ENSURE ENLIVE) LIQD Take 237 mLs by mouth 2 (two) times daily between meals.  . fluticasone (FLONASE) 50 MCG/ACT nasal spray Place 1 spray into both nostrils 2 (two) times daily.  Marland Kitchen gabapentin (NEURONTIN)  300 MG capsule Take 300 mg by mouth 2 (two) times daily.  Marland Kitchen glucose blood test strip Accu-Chek Aviva Plus test strips  . hydrALAZINE (APRESOLINE) 10 MG tablet Take 0.5 tablets (5 mg total) by mouth 3 (three) times daily. Please hold the hydralazine for systolic pressure <789 And for Diastolic pressure <38  . HYDROcodone-acetaminophen (NORCO/VICODIN) 5-325 MG tablet Take 1 tablet by mouth every 6 (six) hours as needed for moderate pain.  . Infant Care Products (DERMACLOUD) CREA Apply topically.  . iron polysaccharides (NIFEREX) 150 MG capsule Take 1 capsule (150 mg total) by mouth 2 (two) times daily.  . isosorbide mononitrate (IMDUR) 30 MG 24 hr tablet TAKE 1 TABLET BY MOUTH EVERY DAY  . latanoprost (XALATAN) 0.005 % ophthalmic solution latanoprost 0.005 % eye drops  . lidocaine (LIDODERM) 5 % Place onto the skin.  Marland Kitchen lisinopril (PRINIVIL,ZESTRIL) 10 MG tablet Take 10 mg by mouth daily. HOLD FOR BLOOD PRESSURE LOWER THAN 90/60  . LORazepam (ATIVAN) 0.5 MG tablet Take 0.5 mg by mouth every 12 (twelve) hours.  . lovastatin (MEVACOR) 40 MG tablet Take 40 mg by mouth at bedtime.  . metoprolol succinate (TOPROL-XL) 25 MG 24 hr tablet Take 25 mg by mouth daily.  . Multiple Vitamin (MULTI-VITAMIN PO) Take 1 tablet by mouth daily.  [name] DELICA LANCETS 10F MISC USE TO TEST BLOOD SUGAR ONCE A DAY  . pantoprazole (PROTONIX) 40 MG tablet Take 40 mg by mouth daily.  . pilocarpine (PILOCAR) 4 % ophthalmic solution Place 1 drop into the right eye 4 (four) times daily.   . polyethylene glycol (MIRALAX) packet Take 17 g by mouth daily.  Marland Kitchen senna-docusate (SENOKOT-S) 8.6-50 MG tablet Take 2 tablets by mouth daily as needed for mild constipation.   . sertraline (ZOLOFT) 50 MG tablet TAKE 1 TABLET BY MOUTH EVERY DAY  . sitaGLIPtin (JANUVIA) 50 MG tablet Take 1 tablet (50 mg total) by mouth daily.  Marland Kitchen torsemide (DEMADEX) 20 MG tablet Take 1 tablet (20 mg total) by mouth daily.  . traZODone (DESYREL) 100 MG  tablet Take 100 mg by mouth at bedtime.  . vitamin C (ASCORBIC ACID) 500 MG tablet Take 500 mg by mouth daily.  . Zinc Oxide 10 % OINT Apply 1 application topically daily as needed (rash). Applied to patient's bottom    Allergies:   Metformin hcl; Metformin; and Metformin and related  Social History:  The patient  reports that she has never smoked. She has never used smokeless tobacco. She reports that she does not drink alcohol or use drugs.   Family History:  The patient's family history includes Colitis in her father; Diabetes in her mother; Glaucoma in her mother; Peptic Ulcer in her father.  ROS:   Review of Systems  Constitutional: Positive for malaise/fatigue. Negative for chills, diaphoresis, fever and weight loss.  HENT: Negative for congestion.   Eyes: Negative for discharge and redness.  Respiratory: Negative for cough, hemoptysis, sputum production, shortness of breath and wheezing.   Cardiovascular: Positive for leg swelling. Negative for chest pain, palpitations, orthopnea, claudication and PND.  Gastrointestinal: Negative for abdominal pain, blood in stool, heartburn, melena, nausea and vomiting.  Genitourinary: Negative for hematuria.  Musculoskeletal: Negative for falls and myalgias.  Skin: Negative for rash.  Neurological: Positive for weakness. Negative for dizziness, tingling, tremors, sensory change, speech change, focal weakness and loss of consciousness.  Endo/Heme/Allergies: Does not bruise/bleed easily.  Psychiatric/Behavioral: Negative for substance abuse. The patient is not nervous/anxious.   All other systems reviewed and are negative.    PHYSICAL EXAM:  VS:  BP 140/60 (BP Location: Left Arm, Patient Position: Sitting, Cuff Size: Normal)   Pulse 76   Ht 5' (1.524 m)   Wt 181 lb (82.1 kg)   BMI 35.35 kg/m  BMI: Body mass index is 35.35 kg/m.  Physical Exam  Constitutional: She is oriented to person, place, and time. She appears well-developed and  well-nourished.  Elderly and chronically ill-appearing  HENT:  Head: Normocephalic and atraumatic.  Eyes: Right eye exhibits no discharge. Left eye exhibits no discharge.  Neck: Normal range of motion. No JVD present.  Cardiovascular: Normal rate, regular rhythm, S1 normal and S2 normal. Exam reveals no distant heart sounds, no friction rub, no midsystolic click and no opening snap.  Murmur heard. Pulses:      Posterior tibial pulses are 2+ on the right side and 2+ on the left side.  2/6 systolic murmur best heard at the RUSB  Pulmonary/Chest: Effort normal and breath sounds normal. No respiratory distress. She has no decreased breath sounds. She has no wheezes. She has no rales. She exhibits no tenderness.  Abdominal: Soft. She exhibits no distension. There is no abdominal tenderness.  Musculoskeletal:        General: Edema present.     Comments: Trace bilateral lower extremity pitting edema to the knees  Neurological: She is alert and oriented to person, place, and time.  Skin: Skin is warm and dry. No cyanosis. Nails show no clubbing.  Psychiatric: She has a normal mood and affect. Her speech is normal and behavior is normal. Judgment and thought content normal.     EKG:  Was ordered and interpreted by me today. Shows NSR, 76 bpm, 1st degree AV block, LBBB (known)  Recent Labs: 01/04/2018: Magnesium 2.1; TSH 1.21 02/18/2018: ALT 14; B Natriuretic Peptide 68.0 03/05/2018: BUN 58; Creatinine, Ser 1.40; Hemoglobin 11.5; Platelets 168; Potassium 4.5; Sodium 138  No results found for requested labs within last 8760 hours.   Estimated Creatinine Clearance: 24.8 mL/min (A) (by C-G formula based on SCr of 1.4 mg/dL (H)).   Wt Readings from Last 3 Encounters:  03/13/18 181 lb (82.1 kg)  03/05/18 180 lb (81.6 kg)  03/02/18 178 lb 12.8 oz (81.1 kg)     Other studies reviewed: Additional studies/records reviewed today include: summarized above  ASSESSMENT AND PLAN:  1.  Lower extremity  swelling/weight gain/chronic diastolic CHF/pulmonary hypertension: Symptoms are improving.  I suspect a fair component of the patient's weight gain and lower extremity swelling were in the setting of dietary indiscretions, saltier foods, and dependent edema.  Some of her shortness of breath was likely in the setting of pulmonary hypertension with acute on chronic diastolic CHF which has improved since transitioning to torsemide.  Given the patient continues to eat salty foods on the weekends, as long as her renal function remains stable, we may need to increase her dose of torsemide on Saturdays and Sundays to accommodate for the saltier foods.  Check BMP today.  Continue compression stockings and leg elevation.  Again stressed the importance of p.o. fluid/salt restriction.  2. Nonobstructive CAD: No symptoms suggestive of angina.  Recent echo demonstrated preserved LV systolic function.  Continue aspirin, Imdur, lisinopril, and Toprol-XL.  No plans for ischemic evaluation at this time given the patient's advanced age, dementia, and comorbid conditions.  3. Severe aortic stenosis status post TAVR: Well-functioning valve noted on most recent echocardiogram in 02/2018.  SBE prophylaxis.  Follow-up with structural heart clinic as recommended.  4. AKI: Renal function was improving to 1.4 on most recent check 1 week prior.  Check BMP today with transition to torsemide.  5. Anemia: Hemoglobin slightly improved on most recent check 1 week prior.  Disposition: F/u with Dr. Rockey Situ or an APP in 3 months.  Current medicines are reviewed at length with the patient today.  The patient did not have any concerns regarding medicines.  Signed, Christell Faith, PA-C 03/13/2018 10:10 AM     [facility] [address] [place] [address], [place] [address] [phone]

## 2018-03-13 ENCOUNTER — Encounter: Payer: Self-pay | Admitting: Physician Assistant

## 2018-03-13 ENCOUNTER — Ambulatory Visit: Payer: PPO | Admitting: Physician Assistant

## 2018-03-13 VITALS — BP 140/60 | HR 76 | Ht 60.0 in | Wt 181.0 lb

## 2018-03-13 DIAGNOSIS — M7989 Other specified soft tissue disorders: Secondary | ICD-10-CM | POA: Diagnosis not present

## 2018-03-13 DIAGNOSIS — I5032 Chronic diastolic (congestive) heart failure: Secondary | ICD-10-CM

## 2018-03-13 DIAGNOSIS — N179 Acute kidney failure, unspecified: Secondary | ICD-10-CM

## 2018-03-13 DIAGNOSIS — I2510 Atherosclerotic heart disease of native coronary artery without angina pectoris: Secondary | ICD-10-CM | POA: Diagnosis not present

## 2018-03-13 DIAGNOSIS — I272 Pulmonary hypertension, unspecified: Secondary | ICD-10-CM

## 2018-03-13 DIAGNOSIS — Z952 Presence of prosthetic heart valve: Secondary | ICD-10-CM

## 2018-03-13 DIAGNOSIS — I350 Nonrheumatic aortic (valve) stenosis: Secondary | ICD-10-CM

## 2018-03-13 DIAGNOSIS — D649 Anemia, unspecified: Secondary | ICD-10-CM

## 2018-03-13 DIAGNOSIS — R635 Abnormal weight gain: Secondary | ICD-10-CM | POA: Diagnosis not present

## 2018-03-13 DIAGNOSIS — I5033 Acute on chronic diastolic (congestive) heart failure: Secondary | ICD-10-CM

## 2018-03-13 NOTE — Patient Instructions (Signed)
Medication Instructions:  Your physician recommends that you continue on your current medications as directed. Please refer to the Current Medication list given to you today.  If you need a refill on your cardiac medications before your next appointment, please call your pharmacy.   Lab work: Your physician recommends that you return for lab work today Artist)  If you have labs (blood work) drawn today and your tests are completely normal, you will receive your results only by: Marland Kitchen MyChart Message (if you have MyChart) OR . A paper copy in the mail If you have any lab test that is abnormal or we need to change your treatment, we will call you to review the results.  Testing/Procedures: None ordered   Follow-Up: At [facility], you and your health needs are our priority.  As part of our continuing mission to provide you with exceptional heart care, we have created designated Provider Care Teams.  These Care Teams include your primary Cardiologist (physician) and Advanced Practice Providers (APPs -  Physician Assistants and Nurse Practitioners) who all work together to provide you with the care you need, when you need it. You will need a follow up appointment in 3 months.  You may see Dr. Rockey Situ or one of the following Advanced Practice Providers on your designated Care Team:   Murray Hodgkins, NP Christell Faith, PA-C . Marrianne Mood, PA-C

## 2018-03-14 ENCOUNTER — Telehealth: Payer: Self-pay

## 2018-03-14 LAB — BASIC METABOLIC PANEL
BUN / CREAT RATIO: 27 (ref 12–28)
BUN: 37 mg/dL — ABNORMAL HIGH (ref 10–36)
CO2: 25 mmol/L (ref 20–29)
Calcium: 9.2 mg/dL (ref 8.7–10.3)
Chloride: 102 mmol/L (ref 96–106)
Creatinine, Ser: 1.37 mg/dL — ABNORMAL HIGH (ref 0.57–1.00)
GFR calc Af Amer: 39 mL/min/{1.73_m2} — ABNORMAL LOW (ref >59)
GFR calc non Af Amer: 34 mL/min/{1.73_m2} — ABNORMAL LOW (ref >59)
Glucose: 140 mg/dL — ABNORMAL HIGH (ref 65–99)
Potassium: 4.4 mmol/L (ref 3.5–5.2)
Sodium: 143 mmol/L (ref 134–144)

## 2018-03-14 NOTE — Telephone Encounter (Signed)
Call to patient, to review lab values. Spoke with daughter [name], she verbalized understanding and is agreeable to POC.   She was questioning whether potassium chloride should be resumed at this time.   Message routed to provider.   Advised pt to call for any further questions or concerns

## 2018-03-14 NOTE — Telephone Encounter (Signed)
-----   Message from Rise Mu, PA-C sent at 03/14/2018 10:31 AM EST ----- Renal function continues to improve with gentle diuresis with torsemide. Continue current dosing of torsemide 20 mg daily. She can take an extra dose for weight gain > 2 pounds overnight or if she eats a salty meal on the weekends. Potassium at goal.

## 2018-03-15 NOTE — Telephone Encounter (Signed)
Call returned to pts daughter [name]. Okay to resume K Cl. She verbalized understanding, no further questions at this time.   Advised pt to call for any further questions or concerns

## 2018-03-15 NOTE — Telephone Encounter (Signed)
Yes, KCl 10 mEq daily can be resumed.

## 2018-03-30 ENCOUNTER — Telehealth: Payer: Self-pay | Admitting: Cardiovascular Disease

## 2018-03-30 NOTE — Telephone Encounter (Signed)
Called and s/w patient's daughter, ok per DPR. She said patient's BP was running lower than normal. Readings as follows taken in the AM or PM prior to medications. Morning cardiac meds - Imdur, Torsemide, and metoprolol. Night cardiac med - Lisinopril 5 mg. 1/18 AM 88/21,  66  PM  97/61,  64 1/19 AM  98/54,  72  PM  103/60,  67 1/21 AM  104/60,  73 1/24 AM  109/62,  71 PM  123/69,  64 1/28 AM  102/62  She's not comfortable when laying down. She wants to sit up. They gave her an extra Torsemide today.  Leg swelling was a little more than her normal. She states she gets "tired" when she is on her way back from the bathroom but denies dizziness or chest pain. Weights remaining stable: 1/26 175  1/27 177 1/28 175.6 1/29 175.2 1/30 176 1/31 176.   Advised them to continue to monitor over the weekend. If difficulty breathing when laying down worsens then they should go to the ER or if any new or worsening symptoms arise.  Routing to [employer], PA-C for review.

## 2018-03-30 NOTE — Telephone Encounter (Signed)
Pt c/o BP issue: STAT if pt c/o blurred vision, one-sided weakness or slurred speech  1. What are your last 5 BP readings?      88/51      98/54     109/62     116/67  2. Are you having any other symptoms (ex. Dizziness, headache, blurred vision, passed out)? Per daughter she believes sob as patient doesn't want to lay flat   3. What is your BP issue?  Running low recently maybe torsemide

## 2018-04-02 ENCOUNTER — Other Ambulatory Visit: Payer: Self-pay | Admitting: Physician Assistant

## 2018-04-02 DIAGNOSIS — E119 Type 2 diabetes mellitus without complications: Secondary | ICD-10-CM | POA: Diagnosis not present

## 2018-04-02 DIAGNOSIS — H401132 Primary open-angle glaucoma, bilateral, moderate stage: Secondary | ICD-10-CM | POA: Diagnosis not present

## 2018-04-02 DIAGNOSIS — H35373 Puckering of macula, bilateral: Secondary | ICD-10-CM | POA: Diagnosis not present

## 2018-04-03 ENCOUNTER — Telehealth: Payer: Self-pay | Admitting: Internal Medicine

## 2018-04-03 ENCOUNTER — Other Ambulatory Visit: Payer: Self-pay | Admitting: Internal Medicine

## 2018-04-03 DIAGNOSIS — B3731 Acute candidiasis of vulva and vagina: Secondary | ICD-10-CM

## 2018-04-03 DIAGNOSIS — B373 Candidiasis of vulva and vagina: Secondary | ICD-10-CM

## 2018-04-03 DIAGNOSIS — E785 Hyperlipidemia, unspecified: Secondary | ICD-10-CM

## 2018-04-03 MED ORDER — FLUCONAZOLE 150 MG PO TABS: 150.0000 mg | ORAL_TABLET | Freq: Once | ORAL | 0 refills | Status: AC

## 2018-04-03 MED ORDER — LOVASTATIN 40 MG PO TABS: 40.0000 mg | ORAL_TABLET | Freq: Every day | ORAL | 1 refills | Status: DC

## 2018-04-03 NOTE — Telephone Encounter (Unsigned)
Copied from [place] [phone]. Topic: General - Other >> Apr 03, 2018 11:46 AM Carolyn Stare wrote:  Daughter [name] call to say pt is having some vaginal itching and may have a yeast infection  and is asking if something can be called in for her o   [place]

## 2018-04-03 NOTE — Telephone Encounter (Signed)
Hold Imdur.

## 2018-04-03 NOTE — Telephone Encounter (Signed)
Copied from [place] [phone]. Topic: Quick Communication - Rx Refill/Question >> Apr 03, 2018  9:57 AM Sheran Luz wrote: Medication: lovastatin (MEVACOR) 40 MG tablet   Patient is requesting a refill of this medication stating that she was advised by previous prescriber that PCP will now need to manage this medication.   Preferred Pharmacy (with phone number or street name):[id number] [address], [place] [phone] (Phone) [phone] (Fax)

## 2018-04-03 NOTE — Telephone Encounter (Signed)
Requested medication (s) are due for refill today: not specified  Requested medication (s) are on the active medication list: yes    Last refill: 12/17/2012  Future visit scheduled yes  04/11/2018  Dr. Terese Door  Notes to clinic:historical provider  Requested Prescriptions  Pending Prescriptions Disp Refills   lovastatin (MEVACOR) 40 MG tablet      Sig: Take 1 tablet (40 mg total) by mouth at bedtime.     Cardiovascular:  Antilipid - Statins Failed - 04/03/2018 10:02 AM      Failed - Total Cholesterol in normal range and within 360 days    Cholesterol  Date Value Ref Range Status  09/11/2014 114 0 - 200 mg/dL Final         Failed - LDL in normal range and within 360 days    LDL Cholesterol  Date Value Ref Range Status  09/11/2014 55 0 - 99 mg/dL Final    Comment:           Total Cholesterol/HDL:CHD Risk Coronary Heart Disease Risk Table                     Men   Women  1/2 Average Risk   3.4   3.3  Average Risk       5.0   4.4  2 X Average Risk   9.6   7.1  3 X Average Risk  23.4   11.0        Use the calculated Patient Ratio above and the CHD Risk Table to determine the patient's CHD Risk.        ATP III CLASSIFICATION (LDL):  <100     mg/dL   Optimal  100-129  mg/dL   Near or Above                    Optimal  130-159  mg/dL   Borderline  160-189  mg/dL   High  >190     mg/dL   Very High          Failed - HDL in normal range and within 360 days    HDL  Date Value Ref Range Status  09/11/2014 40 (L) >40 mg/dL Final         Failed - Triglycerides in normal range and within 360 days    Triglycerides  Date Value Ref Range Status  09/11/2014 97 <150 mg/dL Final         Passed - Patient is not pregnant      Passed - Valid encounter within last 12 months    Recent Outpatient Visits          1 month ago Thin skin   [facility], Jacquelynn Cree, FNP   2 months ago Chronic diastolic CHF (congestive heart failure) ([place])   [place] [facility]use, Jacquelynn Cree, FNP   4 months ago Type 2 diabetes mellitus without complication, without long-term current use of insulin ([place])   [place] McLean-Scocuzza, Nino Glow, MD      Future Appointments            In 1 week McLean-Scocuzza, Nino Glow, MD [place], [facility]

## 2018-04-04 NOTE — Telephone Encounter (Signed)
I spoke with the patient's daughter, [name] (ok per DPR).  She states the patient's SBP have still been low over the weekend. She is currently not at home with the patient, but SBP last night (prior to meds) was 102, and this morning SBP (prior to meds) was 95. The patient did take all of her regular medications this morning. [name] states the patient's weight was up to 178 lbs this morning, so she gave her torsemide 20 mg- 2 tablets (40 mg) this morning.  I advised [name] of Ignacia Bayley, NP's recommendations to have the patient hold imdur for now.  I asked that [name] continue to monitor the patient's blood pressure and weight and call us back if the SBP is still running low off of imdur/ her weight continues to increase and she is requiring more torsemide. [name] voices understanding of the above and is agreeable.

## 2018-04-05 ENCOUNTER — Telehealth: Payer: Self-pay | Admitting: Nurse Practitioner

## 2018-04-05 DIAGNOSIS — I5032 Chronic diastolic (congestive) heart failure: Secondary | ICD-10-CM

## 2018-04-05 NOTE — Telephone Encounter (Signed)
Call from daughter [name].  She reports that pt has weight gain of 2 pounds since yesterday and is weighing today at 180.2 lbs. She 2 tablets (40 mg total) of torsemide but continues to have weakness with walking and bilateral LE swelling with "blisters" present. Denies SOB or chest pain. Daughter endorses that she ate "pigs feet" on Tuesday and found fat back meat in the house which she threw away.  Daughter reports that she continues to hold Imdur and VS this am were; BP 126/66, HR 70.    I reminded [name] to encourage heart healthy, low sodium diet. She states visitors are bringing it in for her.    Advised pt to call for any further questions or concerns. Routed to provider.

## 2018-04-05 NOTE — Telephone Encounter (Signed)
Patient calling  States that the oldest sister is with patient  States that she has gained 2 pounds and is unsteady, weaker Patient legs are still swollen  BP was good - 126/66 HR 70 Please call to discuss

## 2018-04-05 NOTE — Telephone Encounter (Signed)
Called [name] to give her instructions from provider. She reports that pt has PCP appt next wed and she would like to have BMET taken at that time.   She is agreeable to CHF referral. Order placed.   Advised pt to call for any further questions or concerns

## 2018-04-05 NOTE — Telephone Encounter (Signed)
Previous phone notes reviewed.  Pt may take additional torsemide for 2 lbs wt gain over 24 hrs.  It appears that she has been noncompliant with sodium intake.  It's important that she understand that taking additional torsemide to combat her salt habit could lead to deterioration of kidney function.  As she has been taking extra torsemide on several occasions recently, she should have a bmet drawn.  Given the volume of phone calls, freq prn torsemide, wt gain, and high risk for readmission, I think she would benefit from CHF clinic referral.

## 2018-04-09 ENCOUNTER — Ambulatory Visit: Payer: PPO | Admitting: Podiatry

## 2018-04-11 ENCOUNTER — Ambulatory Visit (INDEPENDENT_AMBULATORY_CARE_PROVIDER_SITE_OTHER): Payer: PPO | Admitting: Internal Medicine

## 2018-04-11 ENCOUNTER — Encounter: Payer: Self-pay | Admitting: Internal Medicine

## 2018-04-11 ENCOUNTER — Ambulatory Visit (INDEPENDENT_AMBULATORY_CARE_PROVIDER_SITE_OTHER): Payer: PPO

## 2018-04-11 VITALS — BP 130/72 | HR 71 | Temp 97.7°F | Ht 60.0 in

## 2018-04-11 DIAGNOSIS — M25562 Pain in left knee: Secondary | ICD-10-CM | POA: Diagnosis not present

## 2018-04-11 DIAGNOSIS — L8990 Pressure ulcer of unspecified site, unspecified stage: Secondary | ICD-10-CM | POA: Diagnosis not present

## 2018-04-11 DIAGNOSIS — I350 Nonrheumatic aortic (valve) stenosis: Secondary | ICD-10-CM

## 2018-04-11 DIAGNOSIS — R5381 Other malaise: Secondary | ICD-10-CM

## 2018-04-11 DIAGNOSIS — N179 Acute kidney failure, unspecified: Secondary | ICD-10-CM

## 2018-04-11 DIAGNOSIS — K5900 Constipation, unspecified: Secondary | ICD-10-CM

## 2018-04-11 DIAGNOSIS — R790 Abnormal level of blood mineral: Secondary | ICD-10-CM

## 2018-04-11 DIAGNOSIS — G629 Polyneuropathy, unspecified: Secondary | ICD-10-CM

## 2018-04-11 DIAGNOSIS — G8929 Other chronic pain: Secondary | ICD-10-CM | POA: Diagnosis not present

## 2018-04-11 DIAGNOSIS — M1712 Unilateral primary osteoarthritis, left knee: Secondary | ICD-10-CM | POA: Diagnosis not present

## 2018-04-11 DIAGNOSIS — I5032 Chronic diastolic (congestive) heart failure: Secondary | ICD-10-CM | POA: Diagnosis not present

## 2018-04-11 DIAGNOSIS — I38 Endocarditis, valve unspecified: Secondary | ICD-10-CM

## 2018-04-11 DIAGNOSIS — I10 Essential (primary) hypertension: Secondary | ICD-10-CM

## 2018-04-11 DIAGNOSIS — R102 Pelvic and perineal pain: Secondary | ICD-10-CM | POA: Diagnosis not present

## 2018-04-11 DIAGNOSIS — J309 Allergic rhinitis, unspecified: Secondary | ICD-10-CM

## 2018-04-11 DIAGNOSIS — E119 Type 2 diabetes mellitus without complications: Secondary | ICD-10-CM | POA: Diagnosis not present

## 2018-04-11 DIAGNOSIS — I509 Heart failure, unspecified: Secondary | ICD-10-CM

## 2018-04-11 DIAGNOSIS — I700 Atherosclerosis of aorta: Secondary | ICD-10-CM

## 2018-04-11 DIAGNOSIS — E785 Hyperlipidemia, unspecified: Secondary | ICD-10-CM

## 2018-04-11 DIAGNOSIS — N183 Chronic kidney disease, stage 3 unspecified: Secondary | ICD-10-CM

## 2018-04-11 DIAGNOSIS — E611 Iron deficiency: Secondary | ICD-10-CM

## 2018-04-11 DIAGNOSIS — R600 Localized edema: Secondary | ICD-10-CM | POA: Diagnosis not present

## 2018-04-11 DIAGNOSIS — F419 Anxiety disorder, unspecified: Secondary | ICD-10-CM

## 2018-04-11 DIAGNOSIS — N184 Chronic kidney disease, stage 4 (severe): Secondary | ICD-10-CM | POA: Insufficient documentation

## 2018-04-11 DIAGNOSIS — M109 Gout, unspecified: Secondary | ICD-10-CM

## 2018-04-11 DIAGNOSIS — K219 Gastro-esophageal reflux disease without esophagitis: Secondary | ICD-10-CM

## 2018-04-11 DIAGNOSIS — D509 Iron deficiency anemia, unspecified: Secondary | ICD-10-CM

## 2018-04-11 DIAGNOSIS — F329 Major depressive disorder, single episode, unspecified: Secondary | ICD-10-CM

## 2018-04-11 DIAGNOSIS — I739 Peripheral vascular disease, unspecified: Secondary | ICD-10-CM

## 2018-04-11 DIAGNOSIS — G4700 Insomnia, unspecified: Secondary | ICD-10-CM

## 2018-04-11 DIAGNOSIS — R234 Changes in skin texture: Secondary | ICD-10-CM

## 2018-04-11 DIAGNOSIS — I959 Hypotension, unspecified: Secondary | ICD-10-CM

## 2018-04-11 DIAGNOSIS — M8580 Other specified disorders of bone density and structure, unspecified site: Secondary | ICD-10-CM

## 2018-04-11 LAB — LIPID PANEL
Cholesterol: 144 mg/dL (ref 0–200)
HDL: 41.5 mg/dL (ref >39.00)
LDL Cholesterol: 79 mg/dL (ref 0–99)
NonHDL: 102.43
Total CHOL/HDL Ratio: 3
Triglycerides: 119 mg/dL (ref 0.0–149.0)
VLDL: 23.8 mg/dL (ref 0.0–40.0)

## 2018-04-11 LAB — COMPREHENSIVE METABOLIC PANEL
ALK PHOS: 48 U/L (ref 39–117)
ALT: 9 U/L (ref 0–35)
AST: 16 U/L (ref 0–37)
Albumin: 3.9 g/dL (ref 3.5–5.2)
BUN: 77 mg/dL — ABNORMAL HIGH (ref 6–23)
CO2: 28 mEq/L (ref 19–32)
Calcium: 9.4 mg/dL (ref 8.4–10.5)
Chloride: 99 mEq/L (ref 96–112)
Creatinine, Ser: 2.54 mg/dL — ABNORMAL HIGH (ref 0.40–1.20)
GFR: 21.43 mL/min — ABNORMAL LOW (ref >60.00)
Glucose, Bld: 120 mg/dL — ABNORMAL HIGH (ref 70–99)
POTASSIUM: 4.9 meq/L (ref 3.5–5.1)
Sodium: 138 mEq/L (ref 135–145)
TOTAL PROTEIN: 7 g/dL (ref 6.0–8.3)
Total Bilirubin: 0.4 mg/dL (ref 0.2–1.2)

## 2018-04-11 LAB — URINALYSIS, ROUTINE W REFLEX MICROSCOPIC
Bilirubin Urine: NEGATIVE
Hgb urine dipstick: NEGATIVE
Ketones, ur: NEGATIVE
Nitrite: NEGATIVE
RBC / HPF: NONE SEEN (ref 0–?)
Specific Gravity, Urine: <=1.005 — AB (ref 1.000–1.030)
TOTAL PROTEIN, URINE-UPE24: NEGATIVE
Urine Glucose: NEGATIVE
Urobilinogen, UA: 0.2 (ref 0.0–1.0)
pH: 7 (ref 5.0–8.0)

## 2018-04-11 LAB — HEMOGLOBIN A1C: Hgb A1c MFr Bld: 7.8 % — ABNORMAL HIGH (ref 4.6–6.5)

## 2018-04-11 LAB — MAGNESIUM: Magnesium: 1.9 mg/dL (ref 1.5–2.5)

## 2018-04-11 MED ORDER — LISINOPRIL 2.5 MG PO TABS: 2.5000 mg | ORAL_TABLET | Freq: Every day | ORAL | 3 refills | Status: DC

## 2018-04-11 NOTE — Progress Notes (Signed)
Pre visit review using our clinic review tool, if applicable. No additional management support is needed unless otherwise documented below in the visit note. 

## 2018-04-11 NOTE — Progress Notes (Signed)
Chief Complaint  Patient presents with  . Follow-up   F/u with daughter  1. C/o left knee pain and giving out at times  2. DM 2 cbgs at home increase at times 180s-220s in the am due to dietary choices  3. HTN at home running low at times pt taking 5 mg lisinopril, prn hyralazine, toresemide due to leg edema 40 mg x 2x per week last week  -phone note 03/30/2018 with cards imdur was held due to hypotension   4. C/o abdominal pain lower today will check urine  5. C/o leg edema b/l legs  6. Right buttock pressure ulcer will refer to wound clinic  7. Chronic diastolic CHF per daughter they never heard from CHF clinic will CC Dr. Rockey Situ and place referral CHF clinic  Echo 03/09/2018  Study Conclusions  - Left ventricle: The cavity size was normal. There was moderate   concentric hypertrophy. Systolic function was normal. The   estimated ejection fraction was in the range of 50% to 55%. Wall   motion was normal; there were no regional wall motion   abnormalities. Features are consistent with a pseudonormal left   ventricular filling pattern, with concomitant abnormal relaxation   and increased filling pressure (grade 2 diastolic dysfunction). - Aortic valve: A bioprosthesis ( TAVR) was present and functioning   normally. Mean gradient (S): 15 mm Hg. Valve area (VTI): 1.51   cm^2. - Mitral valve: Calcified annulus. - Left atrium: The atrium was mildly dilated. - Atrial septum: There was an atrial septal aneurysm. - Pulmonary arteries: Systolic pressure was mildly increased. PA   peak pressure: 45 mm Hg (S).  8. Daughter reports increase fatigue and sleep  9. Needs refills on all medications    Review of Systems  Constitutional: Positive for malaise/fatigue.       Weight increased   HENT: Negative for hearing loss.   Eyes: Negative for blurred vision.  Respiratory: Negative for shortness of breath.   Cardiovascular: Negative for chest pain.  Gastrointestinal: Positive for  abdominal pain.  Genitourinary: Negative for dysuria.  Musculoskeletal: Positive for joint pain.  Skin:       Wound to buttocks   Neurological: Negative for headaches.  Psychiatric/Behavioral: Negative for depression.   Past Medical History:  Diagnosis Date  . Anemia   . Carotid arterial disease ([place])    a. 08/2014 U/S: Bilateral < 50% stenosis. Patent vertebrals w/ antegrade flow.   . Cervical spine arthritis   . Chronic diastolic CHF (congestive heart failure) ([place])    a. echo 2014: EF 55-60%, DD;  b. 08/2014 Echo: EF 55-60%, no RWMA, GR1DD; c. 02/2016 Echo: EF 65-70%, Gr1 DD.  Marland Kitchen Cognitive impairment   . Depression   . Diabetes mellitus without complication ([place])   . Essential hypertension   . GERD (gastroesophageal reflux disease)   . Glaucoma   . Gout   . History of renal impairment   . Hyperlipidemia   . Hypotension    a. Related to Norvasc - 11/2014 ED visit.  . Multinodular goiter    a. Noted incidentally on CT 07/2012 and carotid U/S 08/2014;  b. Nl TSH 11/2014.  Marland Kitchen Paroxysmal SVT (supraventricular tachycardia) ([facility])    a. on Toprol   . Prolapsed uterus   . Severe aortic stenosis    a. 11/15/16: s/p TAVR Edwards Sapien 3 THV (size 23 mm, model # 9600TFX, serial # [id number])  . Stroke [facility])    a. CT head 07/2014 showed small thalamic  infarct  . Vitamin deficiency    Past Surgical History:  Procedure Laterality Date  . EYE SURGERY     unsure of what procedure, did know that laser was used  . IR RADIOLOGY PERIPHERAL GUIDED IV START  11/03/2016  . IR US GUIDE VASC ACCESS RIGHT  11/03/2016  . RIGHT/LEFT HEART CATH AND CORONARY ANGIOGRAPHY N/A 10/26/2016   Procedure: RIGHT/LEFT HEART CATH AND CORONARY ANGIOGRAPHY;  Surgeon: [name], MD;  Location: [place] [facility];  Service: Cardiovascular;  Laterality: N/A;  . TEE WITHOUT CARDIOVERSION N/A 03/31/2016   Procedure: TRANSESOPHAGEAL ECHOCARDIOGRAM (TEE);  Surgeon: Minna Merritts, MD;  Location: [facility];  Service:  Cardiovascular;  Laterality: N/A;  . TEE WITHOUT CARDIOVERSION N/A 11/15/2016   Procedure: TRANSESOPHAGEAL ECHOCARDIOGRAM (TEE);  Surgeon: [name], MD;  Location: [place];  Service: Open Heart Surgery;  Laterality: N/A;  . TRANSCATHETER AORTIC VALVE REPLACEMENT, TRANSFEMORAL N/A 11/15/2016   Procedure: TRANSCATHETER AORTIC VALVE REPLACEMENT, TRANSFEMORAL;  Surgeon: [name], MD;  Location: [facility];  Service: Open Heart Surgery;  Laterality: N/A;  . TUBAL LIGATION     Family History  Problem Relation Age of Onset  . Glaucoma Mother   . Diabetes Mother   . Peptic Ulcer Father   . Colitis Father    Social History   Socioeconomic History  . Marital status: Widowed    Spouse name: Not on file  . Number of children: Not on file  . Years of education: Not on file  . Highest education level: Not on file  Occupational History  . Not on file  Social Needs  . Financial resource strain: Not on file  . Food insecurity:    Worry: Not on file    Inability: Not on file  . Transportation needs:    Medical: Not on file    Non-medical: Not on file  Tobacco Use  . Smoking status: Never Smoker  . Smokeless tobacco: Never Used  Substance and Sexual Activity  . Alcohol use: No  . Drug use: No  . Sexual activity: Not on file  Lifestyle  . Physical activity:    Days per week: Not on file    Minutes per session: Not on file  . Stress: Not on file  Relationships  . Social connections:    Talks on phone: Not on file    Gets together: Not on file    Attends religious service: Not on file    Active member of club or organization: Not on file    Attends meetings of clubs or organizations: Not on file    Relationship status: Not on file  . Intimate partner violence:    Fear of current or ex partner: Not on file    Emotionally abused: Not on file    Physically abused: Not on file    Forced sexual activity: Not on file  Other Topics Concern  . Not on file  Social History Narrative    Lives at home    11 kids    Current Meds  Medication Sig  . ACCU-CHEK AVIVA PLUS test strip USE 1 STRIP TO TEST BLOOD SUGARS 3 TIMES A DAY  . acetaminophen (TYLENOL) 500 MG tablet Take 500 mg by mouth every 6 (six) hours as needed for mild pain or headache.  . allopurinol (ZYLOPRIM) 100 MG tablet Take 100 mg by mouth daily.  . Amino Acids-Protein Hydrolys (FEEDING SUPPLEMENT, PRO-STAT SUGAR FREE 64,) LIQD Take 30 mLs by mouth 3 (three) times daily  with meals.  Marland Kitchen aspirin 81 MG chewable tablet Chew 81 mg by mouth daily.  . blood glucose meter kit and supplies Dispense based on patient and insurance preference. Use 1x daily as directed. (FOR ICD-10 E10.9, E11.9).  . brimonidine (ALPHAGAN P) 0.1 % SOLN Place 1 drop into the right eye 2 (two) times daily.  . cholecalciferol (VITAMIN D) 1000 units tablet Take 1,000 Units by mouth daily.  . clotrimazole (LOTRIMIN) 1 % cream APPLY TOPICALLY TO THE AFFECTED AREA ONCE A DAY AT BEDTIME FOR 7-14 DAYS  . dimethicone (PROSHIELD PLUS SKIN PROTECTANT) 1 % cream 1 application to the buttock 2 times daily to help protect skin  . Dorzolamide HCl-Timolol Mal (COSOPT OP) Place 1 drop into the right eye 2 (two) times daily.   . feeding supplement, ENSURE ENLIVE, (ENSURE ENLIVE) LIQD Take 237 mLs by mouth 2 (two) times daily between meals.  . fluticasone (FLONASE) 50 MCG/ACT nasal spray Place 1 spray into both nostrils 2 (two) times daily.  Marland Kitchen gabapentin (NEURONTIN) 300 MG capsule Take 300 mg by mouth 2 (two) times daily.  Marland Kitchen glucose blood test strip Accu-Chek Aviva Plus test strips  . hydrALAZINE (APRESOLINE) 10 MG tablet Take 0.5 tablets (5 mg total) by mouth 3 (three) times daily. Please hold the hydralazine for systolic pressure <332 And for Diastolic pressure <95  . HYDROcodone-acetaminophen (NORCO/VICODIN) 5-325 MG tablet Take 1 tablet by mouth every 6 (six) hours as needed for moderate pain.  . Infant Care Products (DERMACLOUD) CREA Apply topically.  . iron  polysaccharides (NIFEREX) 150 MG capsule Take 1 capsule (150 mg total) by mouth 2 (two) times daily.  Marland Kitchen latanoprost (XALATAN) 0.005 % ophthalmic solution latanoprost 0.005 % eye drops  . lidocaine (LIDODERM) 5 % Place onto the skin.  Marland Kitchen lisinopril (PRINIVIL,ZESTRIL) 2.5 MG tablet Take 1 tablet (2.5 mg total) by mouth daily. HOLD FOR BLOOD PRESSURE LOWER THAN 90/60  . LORazepam (ATIVAN) 0.5 MG tablet Take 0.5 mg by mouth 2 (two) times daily as needed.   . lovastatin (MEVACOR) 40 MG tablet Take 1 tablet (40 mg total) by mouth at bedtime.  . metoprolol succinate (TOPROL-XL) 25 MG 24 hr tablet TAKE 1 TABLET BY MOUTH EVERY DAY  . Multiple Vitamin (MULTI-VITAMIN PO) Take 1 tablet by mouth daily.  Glory Rosebush DELICA LANCETS 18A MISC USE TO TEST BLOOD SUGAR ONCE A DAY  . pantoprazole (PROTONIX) 40 MG tablet Take 40 mg by mouth daily.  . pilocarpine (PILOCAR) 4 % ophthalmic solution Place 1 drop into the right eye 4 (four) times daily.   . polyethylene glycol (MIRALAX) packet Take 17 g by mouth daily.  Marland Kitchen senna-docusate (SENOKOT-S) 8.6-50 MG tablet Take 2 tablets by mouth daily as needed for mild constipation.   . sertraline (ZOLOFT) 50 MG tablet TAKE 1 TABLET BY MOUTH EVERY DAY  . sitaGLIPtin (JANUVIA) 50 MG tablet Take 1 tablet (50 mg total) by mouth daily.  Marland Kitchen torsemide (DEMADEX) 20 MG tablet Take 1 tablet (20 mg total) by mouth daily.  . traZODone (DESYREL) 100 MG tablet Take 50-100 mg by mouth at bedtime as needed.   . vitamin C (ASCORBIC ACID) 500 MG tablet Take 500 mg by mouth daily.  . Zinc Oxide 10 % OINT Apply 1 application topically daily as needed (rash). Applied to patient's bottom  . [DISCONTINUED] lisinopril (PRINIVIL,ZESTRIL) 10 MG tablet Take 5 mg by mouth daily. HOLD FOR BLOOD PRESSURE LOWER THAN 90/60   Allergies  Allergen Reactions  . Metformin Hcl  Other (See Comments)    GI upset  . Metformin Other (See Comments), Diarrhea and Nausea And Vomiting  . Metformin And Related Diarrhea    Recent Results (from the past 2160 hour(s))  CBC     Status: Abnormal   Collection Time: 02/18/18  5:05 PM  Result Value Ref Range   WBC 10.7 (H) 4.0 - 10.5 K/uL   RBC 3.60 (L) 3.87 - 5.11 MIL/uL   Hemoglobin 11.0 (L) 12.0 - 15.0 g/dL   HCT 34.3 (L) 36.0 - 46.0 %   MCV 95.3 80.0 - 100.0 fL   MCH 30.6 26.0 - 34.0 pg   MCHC 32.1 30.0 - 36.0 g/dL   RDW 14.6 11.5 - 15.5 %   Platelets 178 150 - 400 K/uL   nRBC 0.0 0.0 - 0.2 %    Comment: Performed at [facility], [address] [place], [place] [address]  Comprehensive metabolic panel     Status: Abnormal   Collection Time: 02/18/18  5:05 PM  Result Value Ref Range   Sodium 138 135 - 145 mmol/L   Potassium 4.6 3.5 - 5.1 mmol/L   Chloride 101 98 - 111 mmol/L   CO2 27 22 - 32 mmol/L   Glucose, Bld 130 (H) 70 - 99 mg/dL   BUN 37 (H) 8 - 23 mg/dL   Creatinine, Ser 1.47 (H) 0.44 - 1.00 mg/dL   Calcium 9.2 8.9 - 10.3 mg/dL   Total Protein 7.2 6.5 - 8.1 g/dL   Albumin 3.9 3.5 - 5.0 g/dL   AST 22 15 - 41 U/L   ALT 14 0 - 44 U/L   Alkaline Phosphatase 52 38 - 126 U/L   Total Bilirubin 0.6 0.3 - 1.2 mg/dL   GFR calc non Af Amer 31 (L) >60 mL/min   GFR calc Af Amer 36 (L) >60 mL/min   Anion gap 10 5 - 15    Comment: Performed at [facility], [address] [place], [place] [address]  Troponin I - ONCE - STAT     Status: None   Collection Time: 02/18/18  5:05 PM  Result Value Ref Range   Troponin I <0.03 <0.03 ng/mL    Comment: Performed at [facility], [address] [place], [place] [address]  Brain natriuretic peptide     Status: None   Collection Time: 02/18/18  5:05 PM  Result Value Ref Range   B Natriuretic Peptide 68.0 0.0 - 100.0 pg/mL    Comment: Performed at [facility], [address] [place], [place] [address]  CBC     Status: Abnormal   Collection Time: 03/05/18 12:48 PM  Result Value Ref Range   WBC 9.6 4.0 - 10.5 K/uL   RBC 3.81 (L) 3.87 - 5.11 MIL/uL   Hemoglobin  11.5 (L) 12.0 - 15.0 g/dL   HCT 36.4 36.0 - 46.0 %   MCV 95.5 80.0 - 100.0 fL   MCH 30.2 26.0 - 34.0 pg   MCHC 31.6 30.0 - 36.0 g/dL   RDW 15.1 11.5 - 15.5 %   Platelets 168 150 - 400 K/uL   nRBC 0.0 0.0 - 0.2 %    Comment: Performed at [facility], [address] [place],  [address]  Basic metabolic panel     Status: Abnormal   Collection Time: 03/05/18 12:48 PM  Result Value Ref Range   Sodium 138 135 - 145 mmol/L   Potassium 4.5 3.5 - 5.1 mmol/L  Chloride 104 98 - 111 mmol/L   CO2 26 22 - 32 mmol/L   Glucose, Bld 133 (H) 70 - 99 mg/dL   BUN 58 (H) 8 - 23 mg/dL   Creatinine, Ser 1.40 (H) 0.44 - 1.00 mg/dL   Calcium 9.3 8.9 - 10.3 mg/dL   GFR calc non Af Amer 33 (L) >60 mL/min   GFR calc Af Amer 38 (L) >60 mL/min   Anion gap 8 5 - 15    Comment: Performed at [facility], [address] [place], [place] [address]  Basic metabolic panel     Status: Abnormal   Collection Time: 03/13/18 10:37 AM  Result Value Ref Range   Glucose 140 (H) 65 - 99 mg/dL   BUN 37 (H) 10 - 36 mg/dL   Creatinine, Ser 1.37 (H) 0.57 - 1.00 mg/dL   GFR calc non Af Amer 34 (L) >59 mL/min/1.73   GFR calc Af Amer 39 (L) >59 mL/min/1.73   BUN/Creatinine Ratio 27 12 - 28   Sodium 143 134 - 144 mmol/L   Potassium 4.4 3.5 - 5.2 mmol/L   Chloride 102 96 - 106 mmol/L   CO2 25 20 - 29 mmol/L   Calcium 9.2 8.7 - 10.3 mg/dL   Objective  Body mass index is 35.35 kg/m. Wt Readings from Last 3 Encounters:  03/13/18 181 lb (82.1 kg)  03/05/18 180 lb (81.6 kg)  03/02/18 178 lb 12.8 oz (81.1 kg)   Temp Readings from Last 3 Encounters:  04/11/18 97.7 F (36.5 C) (Oral)  03/02/18 98.5 F (36.9 C) (Oral)  02/18/18 97.8 F (36.6 C) (Oral)   BP Readings from Last 3 Encounters:  04/11/18 130/72  03/13/18 140/60  03/05/18 130/70   Pulse Readings from Last 3 Encounters:  04/11/18 71  03/13/18 76  03/05/18 85    Physical Exam Vitals signs and nursing note reviewed.   Constitutional:      Appearance: Normal appearance. She is well-developed and well-groomed. She is obese.     Comments: In and out of sleep today but arousable   HENT:     Head: Normocephalic and atraumatic.     Nose: Nose normal.     Mouth/Throat:     Mouth: Mucous membranes are moist.     Pharynx: Oropharynx is clear.  Eyes:     Conjunctiva/sclera: Conjunctivae normal.     Pupils: Pupils are equal, round, and reactive to light.  Cardiovascular:     Rate and Rhythm: Normal rate and regular rhythm.     Heart sounds: Murmur present.  Pulmonary:     Effort: Pulmonary effort is normal.     Breath sounds: Normal breath sounds.  Abdominal:     General: Abdomen is flat. Bowel sounds are normal.     Tenderness: There is no abdominal tenderness.  Musculoskeletal:     Left knee: Tenderness found. Medial joint line and lateral joint line tenderness noted.     Right lower leg: 2+ Edema present.     Left lower leg: 2+ Edema present.  Skin:    General: Skin is warm and dry.     Comments: Right buttock with small pressure ulcer   Neurological:     General: No focal deficit present.     Mental Status: She is alert and oriented to person, place, and time. Mental status is at baseline.     Gait: Gait abnormal.     Comments: Falling asleep  In wheelchair today  Psychiatric:        Attention and Perception: Attention and perception normal.        Mood and Affect: Mood normal.        Speech: Speech normal.        Behavior: Behavior normal. Behavior is cooperative.        Thought Content: Thought content normal.        Cognition and Memory: Cognition and memory normal.        Judgment: Judgment normal.     Comments: Intermittently falling asleep       Assessment   1. Left knee pain  Xray 04/11/2018  No fracture or dislocation is identified. Mild-to-moderate osteoarthritis about the knee appears worst in the lateral compartment. Chondrocalcinosis is noted. Small joint effusion  is seen. Atherosclerosis is identified.  IMPRESSION: No acute abnormality.  Mild to moderate osteoarthritis appears worst in the lateral compartment.  Chondrocalcinosis.  Atherosclerosis  2. DM 2 A1C 7.8 today goal <8  -on statin and ACEI -podiatry seen 01/04/18   3. HTN with episodes of hypotension  4. Abdominal pain ddx r/o UTI, constipation, mesenteric ischemia with h/o mild to moderate plaque CT ab/pelvis 03/27/16 also noted h/o uterine prolopse which is chronic per CT 5. Chronic diastolic CHF with weight gain, leg edema b/l 2+, AS s/p TAVR Reviewed echo 03/09/2018  6. AKI on CKD 3/4 BL renal function 1.3-1.5 now 2.54 04/11/18 with GFR 21.43 BL GFR 30s 7. Fatigue likely related to physical deconditioning, chronic medical conditions and worsening renal function 8. HM 9. PVD noted on Xray knee. Aortic atherosclerosis  10. Pressure wound right buttock  Plan   1. Xray left knee abnormal  Refer to ortho  2. Currently on januvia 50 mg qd consider reducing dose to 25 if renal function does not improve   Ask about eye exam at f/u 3.  Still holding imdur Will reduce lisinopril dose from 5 to 2.5 mg daily  Cont other meds for now Prn hydralazine  Check fasting labs today  4. Check labs Prn Miralax constipation  UA and culture neg UTI today  Consider imaging I.e CT if continues last CT ab/pelvis 03/27/16  5. CC Dr. Rockey Situ Referred to CHF clinic per daughter has not heard  Doing torsemide 20-40 mg prn did the 40 mg dose 2x last week but daughter c/w hypotension so did not pursue increased freq of higher dose  See #3  6.  Refer to renal  Reduce lis 5 to 2.5 mg qd  7. Monitor referred to CHF and renal clinics  8.  Flu shot utd  Consider pna 23 and Tdap in future  Rx shingrix in the past  prevnar utd   Fasting labs today, consider check uric acid in future  Pap 05/07/12 negative  Mammogram 04/27/16 negative  DEXA 12/18/06 osteopenia vitamin D 80.01 01/04/18  Colonoscopy  03/04/97 hyperplastic polyps no f/u  9 -refer to vascular to assess atherosclerosis ? Mesenteric ischemic as cause of abdominal pain and Xray left knee evidence of PVD  10. Refer to wound clinic   Daughter requests H/H services referred PT/OT/H/H aid and RN  "I spent >60 minutes face-to face with patient with greater than 50% of time spent counseling and/or in coordination of care with review of imaging and cardiology notes, referrals work up with Xray, labs and coordination of specialty care and medication refills and home health services.   Provider: Dr. Olivia Mackie McLean-Scocuzza-Internal Medicine

## 2018-04-11 NOTE — Patient Instructions (Signed)
Chronic Kidney Disease, Adult  Chronic kidney disease (CKD) occurs when the kidneys become damaged slowly over a long period of time. The kidneys are a pair of organs that do many important jobs in the body, including:  · Removing waste and extra fluid from the blood to make urine.  · Making hormones that maintain the amount of fluid in tissues and blood vessels.  · Maintaining the right amount of fluids and chemicals in the body.  A small amount of kidney damage may not cause problems, but a large amount of damage may make it hard or impossible for the kidneys to work the way they should. If steps are not taken to slow down kidney damage or to stop it from getting worse, the kidneys may stop working permanently (end-stage renal disease or ESRD). Most of the time, CKD does not go away, but it can often be controlled. People who have CKD are usually able to live normal lives.  What are the causes?  The most common causes of this condition are diabetes and high blood pressure (hypertension). Other causes include:  · Heart and blood vessel (cardiovascular) disease.  · Kidney diseases, such as:  ? Glomerulonephritis.  ? Interstitial nephritis.  ? Polycystic kidney disease.  ? Renal vascular disease.  · Diseases that affect the immune system.  · Genetic diseases.  · Medicines that damage the kidneys, such as anti-inflammatory medicines.  · Being around or being in contact with poisonous (toxic) substances.  · A kidney or urinary infection that occurs again and again (recurs).  · Vasculitis. This is swelling or inflammation of the blood vessels.  · A problem with urine flow that may be caused by:  ? Cancer.  ? Having kidney stones more than one time.  ? An enlarged prostate, in males.  What increases the risk?  You are more likely to develop this condition if you:  · Are older than age 60.  · Are female.  · Are African-American, Hispanic, Asian, Pacific Islander, or American Indian.  · Are a current or former  smoker.  · Are obese.  · Have a family history of kidney disease or failure.  · Often take medicines that are damaging to the kidneys.  What are the signs or symptoms?  Symptoms of this condition include:  · Swelling (edema) of the face, legs, ankles, or feet.  · Tiredness (lethargy) and having less energy.  · Nausea or vomiting.  · Confusion or trouble concentrating.  · Problems with urination, such as:  ? Painful or burning feeling during urination.  ? Decreased urine production.  ? Frequent urination, especially at night.  ? Bloody urine.  · Muscle twitches and cramps, especially in the legs.  · Shortness of breath.  · Weakness.  · Loss of appetite.  · Metallic taste in the mouth.  · Trouble sleeping.  · Dry, itchy skin.  · A low blood count (anemia).  · Pale lining of the eyelids and surface of the eye (conjunctiva).  Symptoms develop slowly and may not be obvious until the kidney damage becomes severe. It is possible to have kidney disease for years without having any symptoms.  How is this diagnosed?  This condition may be diagnosed based on:  · Blood tests.  · Urine tests.  · Imaging tests, such as an ultrasound or CT scan.  · A test in which a sample of tissue is removed from the kidneys to be examined under a microscope (kidney biopsy).    These test results will help your health care provider determine how serious the CKD is.  How is this treated?  There is no cure for most cases of this condition, but treatment usually relieves symptoms and prevents or slows the progression of the disease. Treatment may include:  · Making diet changes, which may require you to avoid alcohol, salty foods (sodium), and foods that are high in potassium, calcium, and protein.  · Medicines:  ? To lower blood pressure.  ? To control blood glucose.  ? To relieve anemia.  ? To relieve swelling.  ? To protect your bones.  ? To improve the balance of electrolytes in your blood.  · Removing toxic waste from the body through types of  dialysis, if the kidneys can no longer do their job (kidney failure).  · Managing any other conditions that are causing your CKD or making it worse.  Follow these instructions at home:  Medicines  · Take over-the-counter and prescription medicines only as told by your health care provider. The dose of some medicines that you take may need to be adjusted.  · Do not take any new medicines unless approved by your health care provider. Many medicines can worsen your kidney damage.  · Do not take any vitamin and mineral supplements unless approved by your health care provider. Many nutritional supplements can worsen your kidney damage.  General instructions  · Follow your prescribed diet as told by your health care provider.  · Do not use any products that contain nicotine or tobacco, such as cigarettes and e-cigarettes. If you need help quitting, ask your health care provider.  · Monitor and track your blood pressure at home. Report changes in your blood pressure as told by your health care provider.  · If you are being treated for diabetes, monitor and track your blood sugar (blood glucose) levels as told by your health care provider.  · Maintain a healthy weight. If you need help with this, ask your health care provider.  · Start or continue an exercise plan. Exercise at least 30 minutes a day, 5 days a week.  · Keep your immunizations up to date as told by your health care provider.  · Keep all follow-up visits as told by your health care provider. This is important.  Where to find more information  · American Association of Kidney Patients: www.aakp.org  · National Kidney Foundation: www.kidney.org  · American Kidney Fund: www.akfinc.org  · [employer] [facility]: www.lifeoptions.org and www.kidneyschool.org  Contact a health care provider if:  · Your symptoms get worse.  · You develop new symptoms.  Get help right away if:  · You develop symptoms of ESRD, which include:  ? Headaches.  ? Numbness in the  hands or feet.  ? Easy bruising.  ? Frequent hiccups.  ? Chest pain.  ? Shortness of breath.  ? Lack of menstruation, in women.  · You have a fever.  · You have decreased urine production.  · You have pain or bleeding when you urinate.  Summary  · Chronic kidney disease (CKD) occurs when the kidneys become damaged slowly over a long period of time.  · The most common causes of this condition are diabetes and high blood pressure (hypertension).  · There is no cure for most cases of this condition, but treatment usually relieves symptoms and prevents or slows the progression of the disease. Treatment may include a combination of medicines and lifestyle changes.  This information is   not intended to replace advice given to you by your health care provider. Make sure you discuss any questions you have with your health care provider.  Document Released: 11/24/2007 Document Revised: 03/24/2016 Document Reviewed: 03/24/2016  Elsevier Interactive Patient Education © 2019 [employer] Inc[employer]

## 2018-04-12 LAB — URINE CULTURE
MICRO NUMBER:: 185753
SPECIMEN QUALITY:: ADEQUATE

## 2018-04-13 ENCOUNTER — Encounter: Payer: Self-pay | Admitting: Internal Medicine

## 2018-04-15 ENCOUNTER — Telehealth: Payer: Self-pay | Admitting: Internal Medicine

## 2018-04-15 DIAGNOSIS — I700 Atherosclerosis of aorta: Secondary | ICD-10-CM | POA: Insufficient documentation

## 2018-04-15 DIAGNOSIS — N1832 Chronic kidney disease, stage 3b: Secondary | ICD-10-CM | POA: Insufficient documentation

## 2018-04-15 DIAGNOSIS — I739 Peripheral vascular disease, unspecified: Secondary | ICD-10-CM | POA: Insufficient documentation

## 2018-04-15 DIAGNOSIS — N183 Chronic kidney disease, stage 3 (moderate): Secondary | ICD-10-CM

## 2018-04-15 MED ORDER — VITAMIN D3 25 MCG (1000 UNIT) PO TABS: 1000.0000 [IU] | ORAL_TABLET | Freq: Every day | ORAL | 3 refills | Status: AC

## 2018-04-15 MED ORDER — VITAMIN C 500 MG PO TABS: 500.0000 mg | ORAL_TABLET | Freq: Every day | ORAL | 3 refills | Status: DC

## 2018-04-15 MED ORDER — CLOTRIMAZOLE 1 % EX CREA: TOPICAL_CREAM | CUTANEOUS | 11 refills | Status: DC

## 2018-04-15 MED ORDER — LORAZEPAM 0.5 MG PO TABS: 0.5000 mg | ORAL_TABLET | Freq: Two times a day (BID) | ORAL | 5 refills | Status: DC | PRN

## 2018-04-15 MED ORDER — DIMETHICONE 1 % EX CREA: TOPICAL_CREAM | CUTANEOUS | 11 refills | Status: AC

## 2018-04-15 MED ORDER — SITAGLIPTIN PHOSPHATE 50 MG PO TABS: 50.0000 mg | ORAL_TABLET | Freq: Every day | ORAL | 3 refills | Status: DC

## 2018-04-15 MED ORDER — POLYSACCHARIDE IRON COMPLEX 150 MG PO CAPS: 150.0000 mg | ORAL_CAPSULE | Freq: Every day | ORAL | 3 refills | Status: DC

## 2018-04-15 MED ORDER — GLUCOSE BLOOD VI STRP: ORAL_STRIP | 4 refills | Status: DC

## 2018-04-15 MED ORDER — PANTOPRAZOLE SODIUM 40 MG PO TBEC: 40.0000 mg | DELAYED_RELEASE_TABLET | Freq: Every day | ORAL | 3 refills | Status: DC

## 2018-04-15 MED ORDER — DERMACLOUD EX CREA: 1.0000 "application " | TOPICAL_CREAM | Freq: Two times a day (BID) | CUTANEOUS | 11 refills | Status: DC | PRN

## 2018-04-15 MED ORDER — ACETAMINOPHEN 500 MG PO TABS: 500.0000 mg | ORAL_TABLET | Freq: Four times a day (QID) | ORAL | 3 refills | Status: AC | PRN

## 2018-04-15 MED ORDER — ASPIRIN 81 MG PO CHEW: 81.0000 mg | CHEWABLE_TABLET | Freq: Every day | ORAL | 3 refills | Status: AC

## 2018-04-15 MED ORDER — METOPROLOL SUCCINATE ER 25 MG PO TB24: 25.0000 mg | ORAL_TABLET | Freq: Every day | ORAL | 3 refills | Status: DC

## 2018-04-15 MED ORDER — LOVASTATIN 40 MG PO TABS: 40.0000 mg | ORAL_TABLET | Freq: Every day | ORAL | 3 refills | Status: DC

## 2018-04-15 MED ORDER — SERTRALINE HCL 50 MG PO TABS: 50.0000 mg | ORAL_TABLET | Freq: Every day | ORAL | 3 refills | Status: DC

## 2018-04-15 MED ORDER — FLUTICASONE PROPIONATE 50 MCG/ACT NA SUSP: 1.0000 | Freq: Every day | NASAL | 11 refills | Status: DC

## 2018-04-15 MED ORDER — POLYETHYLENE GLYCOL 3350 17 G PO PACK: 17.0000 g | PACK | Freq: Every day | ORAL | 11 refills | Status: DC

## 2018-04-15 MED ORDER — GABAPENTIN 300 MG PO CAPS: 300.0000 mg | ORAL_CAPSULE | Freq: Two times a day (BID) | ORAL | 3 refills | Status: DC

## 2018-04-15 MED ORDER — TRAZODONE HCL 100 MG PO TABS: 50.0000 mg | ORAL_TABLET | Freq: Every evening | ORAL | 3 refills | Status: DC | PRN

## 2018-04-15 MED ORDER — ACCU-CHEK AVIVA PLUS VI STRP: ORAL_STRIP | 4 refills | Status: AC

## 2018-04-15 MED ORDER — ALLOPURINOL 100 MG PO TABS: 100.0000 mg | ORAL_TABLET | Freq: Every day | ORAL | 3 refills | Status: DC

## 2018-04-15 MED ORDER — ZINC OXIDE 10 % EX OINT: 1.0000 "application " | TOPICAL_OINTMENT | Freq: Every day | CUTANEOUS | 11 refills | Status: DC | PRN

## 2018-04-15 NOTE — Telephone Encounter (Signed)
Please inform pts daughter due to her mother having chronic medical conditions and which can be complicated/complex I referred to  1. Vascular 2. Orthopedics  3. CHF clinic  4. Kidney specialist  5. Wound clinic 6. Home health services PT/OT/nurse and aid   Please also review labs kidneys are worsening and #s are up please encourage water intake but limit to <50 ounces per day A1C 7.8  No UTI  Source of abdominal pain could be plaque build up or her uterus looks like it has fallen down on imaging from 02/2016 or could be constipation  If abdominal pain continues I would rec. Repeat CT abdomen/pelvis in the future  -let me know    [place]

## 2018-04-15 NOTE — Progress Notes (Signed)
Patient ID: [name], female    DOB: [birth year], 85 y.o.   MRN: [id number]  HPI  [name] is a 85 y/o female with a history of Dm, hyperlipidemia, HTN, stroke, thyroid disease, GERD, anemia, SVT, severe aortic stenosis and chronic heart failure.   Echo report from 03/09/2018 reviewed and showed an EF of 50-55% along with mildly elevated PA pressure of 16m Hg.   Catheterization done 10/26/16 reviewed and showed: Patent coronary arteries with the exception of severe stenosis in the right PDA. There is mild nonobstructive stenosis of the LAD and RCA and a widely patent left main and left circumflex 2. Moderate to severe pulmonary hypertension with pulmonary artery pressure of 74/21 with a mean of 45 and a pulmonary capillary wedge pressure of 23 with a 32 mmHg T-wave 3. Mean transaortic valve gradient of 17 mmHg and calculated aortic valve area of 1.0 cm, suspect contamination of the pressure gradient with use of an AL-1 catheter and difficult measurement because of frequent ectopy  Was in the ED 02/18/18 due to peripheral edema. Treated with IV lasix and released.   She presents today for her initial visit with a chief complaint of moderate fatigue upon minimal exertion. She describes this as chronic in nature having been present for several years. She has associated shortness of breath, pedal edema, abdominal pain and left knee pain along with this. She denies any difficulty sleeping, dizziness, abdominal distention, palpitations, chest pain or weight gain. Has multiple referral appointments pending per PCP. Is unsure of how much fluid she drinks daily but says that she "loves to drink cold beverages".   Past Medical History:  Diagnosis Date  . Anemia   . Carotid arterial disease ([facility]    a. 08/2014 U/S: Bilateral < 50% stenosis. Patent vertebrals w/ antegrade flow.   . Cervical spine arthritis   . Chronic diastolic CHF (congestive heart failure) ([facility]    a. echo 2014: EF  55-60%, DD;  b. 08/2014 Echo: EF 55-60%, no RWMA, GR1DD; c. 02/2016 Echo: EF 65-70%, Gr1 DD.  .Marland KitchenCognitive impairment   . Depression   . Diabetes mellitus without complication ([facility]   . Essential hypertension   . GERD (gastroesophageal reflux disease)   . Glaucoma   . Gout   . History of renal impairment   . Hyperlipidemia   . Hypotension    a. Related to Norvasc - 11/2014 ED visit.  . Multinodular goiter    a. Noted incidentally on CT 07/2012 and carotid U/S 08/2014;  b. Nl TSH 11/2014.  .Marland KitchenParoxysmal SVT (supraventricular tachycardia) (HCC)    a. on Toprol   . Prolapsed uterus   . Severe aortic stenosis    a. 11/15/16: s/p TAVR Edwards Sapien 3 THV (size 23 mm, model # 9600TFX, serial # [id number]  . Stroke ([facility]    a. CT head 07/2014 showed small thalamic infarct  . Vitamin deficiency    Past Surgical History:  Procedure Laterality Date  . EYE SURGERY     unsure of what procedure, did know that laser was used  . IR RADIOLOGY PERIPHERAL GUIDED IV START  11/03/2016  . IR [place] VASC ACCESS RIGHT  11/03/2016  . RIGHT/LEFT HEART CATH AND CORONARY ANGIOGRAPHY N/A 10/26/2016   Procedure: RIGHT/LEFT HEART CATH AND CORONARY ANGIOGRAPHY;  Surgeon: CSherren Mocha MD;  [facility];  Service: Cardiovascular;  Laterality: N/A;  . TEE WITHOUT CARDIOVERSION N/A 03/31/2016   Procedure: TRANSESOPHAGEAL ECHOCARDIOGRAM (TEE);  Surgeon: Minna Merritts, MD;  Location: [facility];  Service: Cardiovascular;  Laterality: N/A;  . TEE WITHOUT CARDIOVERSION N/A 11/15/2016   Procedure: TRANSESOPHAGEAL ECHOCARDIOGRAM (TEE);  Surgeon: [name], MD;  Location: [place];  Service: Open Heart Surgery;  Laterality: N/A;  . TRANSCATHETER AORTIC VALVE REPLACEMENT, TRANSFEMORAL N/A 11/15/2016   Procedure: TRANSCATHETER AORTIC VALVE REPLACEMENT, TRANSFEMORAL;  Surgeon: [name], MD;  Location: [place];  Service: Open Heart Surgery;  Laterality: N/A;  . TUBAL LIGATION     Family History  Problem  Relation Age of Onset  . Glaucoma Mother   . Diabetes Mother   . Peptic Ulcer Father   . Colitis Father    Social History   Tobacco Use  . Smoking status: Never Smoker  . Smokeless tobacco: Never Used  Substance Use Topics  . Alcohol use: No   Allergies  Allergen Reactions  . Metformin Hcl Other (See Comments)    GI upset  . Metformin Other (See Comments), Diarrhea and Nausea And Vomiting  . Metformin And Related Diarrhea   Prior to Admission medications   Medication Sig Start Date End Date Taking? Authorizing Provider  ACCU-CHEK AVIVA PLUS test strip USE 1 STRIP TO TEST BLOOD SUGARS 3 TIMES A DAY 04/15/18  Yes McLean-Scocuzza, Nino Glow, MD  acetaminophen (TYLENOL) 500 MG tablet Take 1 tablet (500 mg total) by mouth every 6 (six) hours as needed for mild pain or headache. 04/15/18  Yes McLean-Scocuzza, Nino Glow, MD  allopurinol (ZYLOPRIM) 100 MG tablet Take 1 tablet (100 mg total) by mouth daily. 04/15/18  Yes McLean-Scocuzza, Nino Glow, MD  Amino Acids-Protein Hydrolys (FEEDING SUPPLEMENT, PRO-STAT SUGAR FREE 64,) LIQD Take 30 mLs by mouth 3 (three) times daily with meals. Takes as needed   Yes [provider]  aspirin 81 MG chewable tablet Chew 1 tablet (81 mg total) by mouth daily. 04/15/18  Yes McLean-Scocuzza, Nino Glow, MD  blood glucose meter kit and supplies Dispense based on patient and insurance preference. Use 1x daily as directed. (FOR ICD-10 E10.9, E11.9). 11/30/17  Yes McLean-Scocuzza, Nino Glow, MD  brimonidine (ALPHAGAN P) 0.1 % SOLN Place 1 drop into the right eye 2 (two) times daily.   Yes [provider]  Carboxymethylcellulose Sodium (ARTIFICIAL TEARS OP) Apply 1 drop to eye 2 (two) times daily. Left eye   Yes [provider]  cholecalciferol (VITAMIN D) 25 MCG (1000 UT) tablet Take 1 tablet (1,000 Units total) by mouth daily. Patient taking differently: Take 2,000 Units by mouth daily.  04/15/18  Yes McLean-Scocuzza, Nino Glow, MD  clotrimazole  (LOTRIMIN) 1 % cream APPLY TOPICALLY TO THE AFFECTED AREA ONCE A DAY AT BEDTIME FOR 7-14 DAYS 04/15/18  Yes McLean-Scocuzza, Nino Glow, MD  Dorzolamide HCl-Timolol Mal (COSOPT OP) Place 1 drop into the right eye 2 (two) times daily.    Yes [provider]  fluticasone (FLONASE) 50 MCG/ACT nasal spray Place 1 spray into both nostrils daily. Prn max 2 sprays total each nose 04/15/18  Yes McLean-Scocuzza, Nino Glow, MD  gabapentin (NEURONTIN) 300 MG capsule Take 1 capsule (300 mg total) by mouth 2 (two) times daily. 04/15/18  Yes McLean-Scocuzza, Nino Glow, MD  glucose blood test strip Accu-Chek Aviva Plus test strips 04/15/18  Yes McLean-Scocuzza, Nino Glow, MD  Infant Care Products (DERMACLOUD) CREA Apply 1 application topically 2 (two) times daily as needed. 04/15/18  Yes McLean-Scocuzza, Nino Glow, MD  iron polysaccharides (NIFEREX) 150 MG capsule Take 1 capsule (150 mg total) by  mouth daily. Note reduction in frequency from 2x to 1 x per day 04/15/18  Yes McLean-Scocuzza, Nino Glow, MD  latanoprost (XALATAN) 0.005 % ophthalmic solution Place 1 drop into the right eye at bedtime.    Yes [provider]  lisinopril (PRINIVIL,ZESTRIL) 2.5 MG tablet Take 1 tablet (2.5 mg total) by mouth daily. HOLD FOR BLOOD PRESSURE LOWER THAN 90/60 04/11/18  Yes McLean-Scocuzza, Nino Glow, MD  LORazepam (ATIVAN) 0.5 MG tablet Take 1 tablet (0.5 mg total) by mouth 2 (two) times daily as needed. 04/15/18  Yes McLean-Scocuzza, Nino Glow, MD  lovastatin (MEVACOR) 40 MG tablet Take 1 tablet (40 mg total) by mouth at bedtime. 04/15/18  Yes McLean-Scocuzza, Nino Glow, MD  metoprolol succinate (TOPROL-XL) 25 MG 24 hr tablet Take 1 tablet (25 mg total) by mouth daily. 04/15/18  Yes McLean-Scocuzza, Nino Glow, MD  Multiple Vitamin (MULTI-VITAMIN PO) Take 1 tablet by mouth daily.   Yes [provider]  Nutritional Supplements (BOOST GLUCOSE CONTROL PO) Take 1 Container by mouth daily.   Yes [provider]  Jonetta Speak  LANCETS 40X MISC USE TO TEST BLOOD SUGAR ONCE A DAY 11/30/17  Yes [provider]  pantoprazole (PROTONIX) 40 MG tablet Take 1 tablet (40 mg total) by mouth daily. 30 minutes before breakfast OR dinner 04/15/18  Yes McLean-Scocuzza, Nino Glow, MD  pilocarpine (PILOCAR) 4 % ophthalmic solution Place 1 drop into the right eye 4 (four) times daily.    Yes [provider]  polyethylene glycol (MIRALAX) packet Take 17 g by mouth daily. prn 04/15/18  Yes McLean-Scocuzza, Nino Glow, MD  potassium chloride (K-DUR,KLOR-CON) 10 MEQ tablet Take 10 mEq by mouth daily. With fluid pill   Yes [provider]  sertraline (ZOLOFT) 50 MG tablet Take 1 tablet (50 mg total) by mouth daily. 04/15/18  Yes McLean-Scocuzza, Nino Glow, MD  sitaGLIPtin (JANUVIA) 50 MG tablet Take 1 tablet (50 mg total) by mouth daily. 04/15/18  Yes McLean-Scocuzza, Nino Glow, MD  torsemide (DEMADEX) 20 MG tablet Take 1 tablet (20 mg total) by mouth daily. 03/05/18 06/03/18 Yes Dunn, Areta Haber, PA-C  traZODone (DESYREL) 100 MG tablet Take 0.5-1 tablets (50-100 mg total) by mouth at bedtime as needed. 04/15/18  Yes McLean-Scocuzza, Nino Glow, MD  vitamin C (ASCORBIC ACID) 500 MG tablet Take 1 tablet (500 mg total) by mouth daily. 04/15/18  Yes McLean-Scocuzza, Nino Glow, MD  Zinc Oxide 10 % OINT Apply 1 application topically daily as needed (rash). Applied to patient's bottom 04/15/18  Yes McLean-Scocuzza, Nino Glow, MD  dimethicone (PROSHIELD PLUS SKIN PROTECTANT) 1 % cream 1 application to the buttock 2 times daily to help protect skin Patient not taking: Reported on 04/16/2018 04/15/18   McLean-Scocuzza, Nino Glow, MD  feeding supplement, ENSURE ENLIVE, (ENSURE ENLIVE) LIQD Take 237 mLs by mouth 2 (two) times daily between meals. Patient not taking: Reported on 04/16/2018 04/01/16   Fritzi Mandes, MD  hydrALAZINE (APRESOLINE) 10 MG tablet Take 0.5 tablets (5 mg total) by mouth 3 (three) times daily. Please hold the hydralazine for systolic pressure  <735 And for Diastolic pressure <32 Patient not taking: Reported on 04/16/2018 09/21/17   Minna Merritts, MD  HYDROcodone-acetaminophen (NORCO/VICODIN) 5-325 MG tablet Take 1 tablet by mouth every 6 (six) hours as needed for moderate pain. Patient not taking: Reported on 04/16/2018 09/02/16   Baxter Hire, MD  lidocaine (LIDODERM) 5 % Place onto the skin. 04/01/16   [provider]  senna-docusate (SENOKOT-S) 8.6-50  MG tablet Take 2 tablets by mouth daily as needed for mild constipation.     [provider]    Review of Systems  Constitutional: Positive for fatigue (easily). Negative for appetite change.  HENT: Negative for congestion, postnasal drip and sore throat.   Eyes: Negative.   Respiratory: Positive for shortness of breath (minimal). Negative for chest tightness.   Cardiovascular: Positive for leg swelling. Negative for chest pain and palpitations.  Gastrointestinal: Positive for abdominal pain (at times). Negative for abdominal distention.  Endocrine: Negative.   Genitourinary: Negative.   Musculoskeletal: Positive for arthralgias (left knee). Negative for back pain and neck pain.  Skin: Negative.   Allergic/Immunologic: Negative.   Neurological: Negative for dizziness and light-headedness.  Hematological: Negative for adenopathy. Does not bruise/bleed easily.  Psychiatric/Behavioral: Negative for dysphoric mood and sleep disturbance. The patient is not nervous/anxious.     Vitals:   04/16/18 1025  BP: 139/77  Pulse: 75  Resp: 15  Temp: 97.7 F (36.5 C)  TempSrc: Oral  SpO2: 98%  Weight: 179 lb (81.2 kg)  Height: 5' (1.524 m)   Wt Readings from Last 3 Encounters:  04/16/18 179 lb (81.2 kg)  03/13/18 181 lb (82.1 kg)  03/05/18 180 lb (81.6 kg)   Lab Results  Component Value Date   CREATININE 2.54 (H) 04/11/2018   CREATININE 1.37 (H) 03/13/2018   CREATININE 1.40 (H) 03/05/2018    Physical Exam Vitals signs and nursing note reviewed.   Constitutional:      Appearance: Normal appearance.  HENT:     Head: Normocephalic and atraumatic.  Neck:     Musculoskeletal: Normal range of motion and neck supple.  Cardiovascular:     Rate and Rhythm: Normal rate and regular rhythm.  Pulmonary:     Effort: Pulmonary effort is normal. No respiratory distress.     Breath sounds: No wheezing or rales.  Abdominal:     General: There is no distension.     Palpations: Abdomen is soft.  Musculoskeletal:        General: No tenderness.     Right lower leg: Edema (1+ pitting) present.     Left lower leg: Edema (1+ pitting) present.  Skin:    General: Skin is warm and dry.  Neurological:     General: No focal deficit present.     Mental Status: She is alert and oriented to person, place, and time.  Psychiatric:        Mood and Affect: Mood normal.        Behavior: Behavior normal.    Assessment & Plan:  1: Chronic heart failure with preserved ejection fraction- - NYHA class III - euvolemic today - weighing daily and daughter was reminded to call for an overnight weight gain of >2 pounds or a weekly weight gain of >5 pounds - not adding salt to her food except a "sprinkle" when eating seafood. Encouraged to not add any salt to her food and explained how to read food labels so that she can stay under 2059m sodium / day. Written dietary information was given to them about this as well.  - daughter says that she has had family members bringing patient saltier foods like fat back biscuits, pigfeet etc and she's been trying to get people to understand that patient needs to eat low sodium - unsure of how much fluids she's drinking daily but says that she "loves to drink cold drinks". Explained that she could possibly be drinking too much  fluids and that she should have between 50-60 ounces of fluids / day - limited in diuretic use due to renal function - daughter says that a home health referral is supposed to be made by her PCP - saw  cardiology (Dunn) 03/13/2018 - BNP 02/18/18 was 68.0 - PharmD reconciled medications with the patient - patient reports receiving her flu vaccine for this season  2: HTN- - BP looks good today - saw PCP (McLean-Scocuzza) 04/11/2018 - BMP from 04/11/2018 reviewed and showed sodium 138, potassium 4.9, creatinine 2.54 and GFR 21.43 - referral has been made to nephrology by PCP  3: DM- - A1c 04/11/2018 was 7.8% - unsure of what her glucose was this morning but patient says that it was checked  4: Lymphedema- - stage 2 - elevating her legs "at times"; encouraged her to elevate her legs when sitting for long periods of time - wearing support socks sometimes depending on who is assisting her for the day; encouraged her to wear them daily with removal at bedtime. Patient says that the socks sometimes hurt her legs - limited in her ability to exercise due to her fatigue - discussed lymphapress compression boots if symptoms persist  Medication bottles were reviewed.  Return in 6 weeks or sooner for any questions/problems before then.

## 2018-04-16 ENCOUNTER — Encounter: Payer: Self-pay | Admitting: Family

## 2018-04-16 ENCOUNTER — Other Ambulatory Visit: Payer: Self-pay

## 2018-04-16 ENCOUNTER — Telehealth: Payer: Self-pay | Admitting: Internal Medicine

## 2018-04-16 ENCOUNTER — Ambulatory Visit: Payer: PPO | Attending: Family | Admitting: Family

## 2018-04-16 VITALS — BP 139/77 | HR 75 | Temp 97.7°F | Resp 15 | Ht 60.0 in | Wt 179.0 lb

## 2018-04-16 DIAGNOSIS — I5032 Chronic diastolic (congestive) heart failure: Secondary | ICD-10-CM | POA: Insufficient documentation

## 2018-04-16 DIAGNOSIS — E119 Type 2 diabetes mellitus without complications: Secondary | ICD-10-CM | POA: Insufficient documentation

## 2018-04-16 DIAGNOSIS — I110 Hypertensive heart disease with heart failure: Secondary | ICD-10-CM | POA: Insufficient documentation

## 2018-04-16 DIAGNOSIS — M109 Gout, unspecified: Secondary | ICD-10-CM | POA: Insufficient documentation

## 2018-04-16 DIAGNOSIS — E785 Hyperlipidemia, unspecified: Secondary | ICD-10-CM | POA: Diagnosis not present

## 2018-04-16 DIAGNOSIS — I471 Supraventricular tachycardia: Secondary | ICD-10-CM | POA: Insufficient documentation

## 2018-04-16 DIAGNOSIS — Z83511 Family history of glaucoma: Secondary | ICD-10-CM | POA: Insufficient documentation

## 2018-04-16 DIAGNOSIS — Z79899 Other long term (current) drug therapy: Secondary | ICD-10-CM | POA: Insufficient documentation

## 2018-04-16 DIAGNOSIS — E569 Vitamin deficiency, unspecified: Secondary | ICD-10-CM | POA: Insufficient documentation

## 2018-04-16 DIAGNOSIS — K219 Gastro-esophageal reflux disease without esophagitis: Secondary | ICD-10-CM | POA: Diagnosis not present

## 2018-04-16 DIAGNOSIS — I890 Lymphedema, not elsewhere classified: Secondary | ICD-10-CM | POA: Insufficient documentation

## 2018-04-16 DIAGNOSIS — I350 Nonrheumatic aortic (valve) stenosis: Secondary | ICD-10-CM | POA: Insufficient documentation

## 2018-04-16 DIAGNOSIS — R4189 Other symptoms and signs involving cognitive functions and awareness: Secondary | ICD-10-CM | POA: Insufficient documentation

## 2018-04-16 DIAGNOSIS — H409 Unspecified glaucoma: Secondary | ICD-10-CM | POA: Insufficient documentation

## 2018-04-16 DIAGNOSIS — Z833 Family history of diabetes mellitus: Secondary | ICD-10-CM | POA: Insufficient documentation

## 2018-04-16 DIAGNOSIS — Z7982 Long term (current) use of aspirin: Secondary | ICD-10-CM | POA: Insufficient documentation

## 2018-04-16 DIAGNOSIS — D649 Anemia, unspecified: Secondary | ICD-10-CM | POA: Diagnosis not present

## 2018-04-16 DIAGNOSIS — F329 Major depressive disorder, single episode, unspecified: Secondary | ICD-10-CM | POA: Insufficient documentation

## 2018-04-16 DIAGNOSIS — E1122 Type 2 diabetes mellitus with diabetic chronic kidney disease: Secondary | ICD-10-CM

## 2018-04-16 DIAGNOSIS — I272 Pulmonary hypertension, unspecified: Secondary | ICD-10-CM | POA: Insufficient documentation

## 2018-04-16 DIAGNOSIS — I10 Essential (primary) hypertension: Secondary | ICD-10-CM

## 2018-04-16 DIAGNOSIS — N184 Chronic kidney disease, stage 4 (severe): Secondary | ICD-10-CM

## 2018-04-16 DIAGNOSIS — Z952 Presence of prosthetic heart valve: Secondary | ICD-10-CM | POA: Diagnosis not present

## 2018-04-16 NOTE — Patient Instructions (Signed)
Continue weighing daily and call for an overnight weight gain of > 2 pounds or a weekly weight gain of >5 pounds. 

## 2018-04-16 NOTE — Telephone Encounter (Signed)
Verbal given to switch. [facility] will call pt when ready

## 2018-04-16 NOTE — Telephone Encounter (Signed)
Ok to do call [facility]   [place]

## 2018-04-16 NOTE — Telephone Encounter (Signed)
Patients daughter is aware. Pt saw CHF clinic today

## 2018-04-16 NOTE — Telephone Encounter (Signed)
Copied from [place] [phone]. Topic: General - Other >> Apr 16, 2018  8:31 AM Lennox Solders wrote: Reason for CRM: [facility] not [facility] dimethicone cream in stock however they can order ointment. Please advise

## 2018-04-17 ENCOUNTER — Telehealth: Payer: Self-pay

## 2018-04-17 ENCOUNTER — Encounter: Payer: PPO | Attending: Physician Assistant | Admitting: Physician Assistant

## 2018-04-17 DIAGNOSIS — E119 Type 2 diabetes mellitus without complications: Secondary | ICD-10-CM | POA: Diagnosis not present

## 2018-04-17 DIAGNOSIS — I2510 Atherosclerotic heart disease of native coronary artery without angina pectoris: Secondary | ICD-10-CM | POA: Diagnosis not present

## 2018-04-17 DIAGNOSIS — R531 Weakness: Secondary | ICD-10-CM | POA: Insufficient documentation

## 2018-04-17 DIAGNOSIS — L539 Erythematous condition, unspecified: Secondary | ICD-10-CM | POA: Diagnosis not present

## 2018-04-17 DIAGNOSIS — L89329 Pressure ulcer of left buttock, unspecified stage: Secondary | ICD-10-CM | POA: Diagnosis not present

## 2018-04-17 DIAGNOSIS — Z7984 Long term (current) use of oral hypoglycemic drugs: Secondary | ICD-10-CM | POA: Insufficient documentation

## 2018-04-17 DIAGNOSIS — Z952 Presence of prosthetic heart valve: Secondary | ICD-10-CM | POA: Insufficient documentation

## 2018-04-17 DIAGNOSIS — I509 Heart failure, unspecified: Secondary | ICD-10-CM | POA: Diagnosis not present

## 2018-04-17 DIAGNOSIS — I110 Hypertensive heart disease with heart failure: Secondary | ICD-10-CM | POA: Insufficient documentation

## 2018-04-17 DIAGNOSIS — L89319 Pressure ulcer of right buttock, unspecified stage: Secondary | ICD-10-CM | POA: Diagnosis not present

## 2018-04-17 NOTE — Telephone Encounter (Signed)
The rec dose for the iron pills is 1x per day when I order in computer and this can be causing constipation  Inform daughter  [place]

## 2018-04-17 NOTE — Telephone Encounter (Signed)
Copied from [place] [phone]. Topic: General - Other >> Apr 17, 2018 11:06 AM Carolyn Stare wrote:  Pt daughter call to say the below RX was called in incorrectly, she said the doctor had increased the below med 2 twice a day but the RX was sent in for 1 times a day    iron polysaccharides (NIFEREX) 150 MG capsule

## 2018-04-20 ENCOUNTER — Telehealth: Payer: Self-pay | Admitting: Internal Medicine

## 2018-04-20 ENCOUNTER — Other Ambulatory Visit: Payer: Self-pay | Admitting: Internal Medicine

## 2018-04-20 ENCOUNTER — Ambulatory Visit: Payer: PPO | Admitting: Internal Medicine

## 2018-04-20 ENCOUNTER — Ambulatory Visit: Payer: Self-pay

## 2018-04-20 DIAGNOSIS — R1030 Lower abdominal pain, unspecified: Secondary | ICD-10-CM

## 2018-04-20 DIAGNOSIS — R634 Abnormal weight loss: Secondary | ICD-10-CM

## 2018-04-20 DIAGNOSIS — N814 Uterovaginal prolapse, unspecified: Secondary | ICD-10-CM

## 2018-04-20 DIAGNOSIS — N939 Abnormal uterine and vaginal bleeding, unspecified: Secondary | ICD-10-CM

## 2018-04-20 NOTE — Telephone Encounter (Signed)
Does daughter want me to order CT ab/pelvis to work up for bleeding down there h/o uterine prolapse ? We checked urine last visit no UTI  [place]

## 2018-04-20 NOTE — Telephone Encounter (Signed)
Daughter is ok with the CT.

## 2018-04-20 NOTE — Telephone Encounter (Signed)
Called and spoke with pt's daughter. Pt has an appt scheduled today with [facility] did advise daughter to bring her mother in to be seen. Daughter stated that her mother BP has been low as well.   Sent to PCP as an FYI  appt today is at 2:30 PM.

## 2018-04-20 NOTE — Telephone Encounter (Signed)
Pt.'s daughter.Enid Derry reports she noticed pt. Having vaginal bleeding this morning. Had to put a pad in her Depend. States she can see where the bleeding is - coming from prolapse. No pain, other than her knee.Daughter concerned "she could have an infection." No pain or burning with urination. Has seemed weaker over the past 2 weeks, but is still up ambulating. Daughter reports "I think she takes an aspirin daily." Appointment made for today.  Reason for Disposition . Postmenopausal vaginal bleeding  Answer Assessment - Initial Assessment Questions 1. AMOUNT: "Describe the bleeding that you are having." "How much bleeding is there?"    - SPOTTING: spotting, or pinkish / brownish mucous discharge; does not fill panti-liner or pad    - MILD:  less than 1 pad / hour; less than patient's  menstrual bleeding when she still had menstrual periods   - MODERATE: 1-2 pads / hour; small-medium blood clots (e.g., pea, grape, small coin)    - SEVERE: soaking 2 or more pads/hour for 2 or more hours; bleeding not contained by pads or continuous red blood from vagina; large blood clots (e.g., golf ball, large coin)      Moderate 2. ONSET: "When did the bleeding begin?" "Is it continuing now?"     This morning 3. MENOPAUSE: "When was your last menstrual period?"      Years ago - has uterine prolapse 4. ABDOMINAL PAIN: "Do you have any pain?" "How bad is the pain?"  (e.g., Scale 1-10; mild, moderate, or severe)   - MILD (1-3): doesn't interfere with normal activities, abdomen soft and not tender to touch    - MODERATE (4-7): interferes with normal activities or awakens from sleep, tender to touch    - SEVERE (8-10): excruciating pain, doubled over, unable to do any normal activities      ABDOMINAL SWELLING 5. BLOOD THINNERS: "Do you take any blood thinners?" (e.g., Coumadin/warfarin, Pradaxa/dabigatran, aspirin)     Asa 6. HORMONES: "Are you taking any hormone medications, prescription or OTC?" (e.g., birth  control pills, estrogen)     No 7. CAUSE: "What do you think is causing the bleeding?" (e.g., recent gyn surgery, recent gyn procedure; known bleeding disorder, uterine cancer)       Prolapsed uterus - maybe an infection 8. HEMODYNAMIC STATUS: "Are you weak or feeling lightheaded?" If so, ask: "Can you stand and walk normally?"       Needed more assistance over the past 2 weeks 9. OTHER SYMPTOMS: "What other symptoms are you having with the bleeding?" (e.g., back pain, burning with urination, fever)     Itching  Protocols used: VAGINAL BLEEDING - POSTMENOPAUSAL-A-AH

## 2018-04-20 NOTE — Telephone Encounter (Signed)
Please see CT ab/pelvis order   Thanks tMS

## 2018-04-20 NOTE — Telephone Encounter (Signed)
Patients daughter was informed of results and information.   Patients daughter. understood and no questions, comments, or concerns at this time.

## 2018-04-21 NOTE — Progress Notes (Signed)
[place] ([id number]) Visit Report for 04/17/2018 Chief Complaint Document Details Patient Name: [name], [name]. Date of Service: 04/17/2018 1:00 PM Medical Record Number: [id number] Patient Account Number: [id number] Date of Birth/Sex: 07/09/1924 (85 y.o. F) Treating RN: Montey Hora Primary Care Provider: Myrtie Hawk Other Clinician: Referring Provider: Myrtie Hawk Treating Provider/Extender: Melburn Hake, Sarabella Caprio Weeks in Treatment: 0 Information Obtained from: Patient Chief Complaint Gluteal pressure injury Electronic Signature(s) Signed: 04/17/2018 5:20:18 PM By: Worthy Keeler PA-C Entered By: Worthy Keeler on 04/17/2018 17:15:26 [place] ([id number]) -------------------------------------------------------------------------------- HPI Details Patient Name: [name], Cassandra M. Date of Service: 04/17/2018 1:00 PM Medical Record Number: [id number] Patient Account Number: [id number] Date of Birth/Sex: [birth year] (85 y.o. F) Treating RN: Montey Hora Primary Care Provider: Myrtie Hawk Other Clinician: Referring Provider: Myrtie Hawk Treating Provider/Extender: Melburn Hake, Josey Forcier Weeks in Treatment: 0 History of Present Illness HPI Description: 04/17/18 on evaluation today patient presents for initial inspection in our clinic concerning issues that she has been having with her gluteal region. Things actually appear to be doing much better at this point although we could go her daughter tells me that the patient was actually having areas that were draining in the gluteal region bilaterally. Fortunately these regions appear to have resolved. Still there is evidence of pressure injury to the gluteal region she has blanchable erythema but decreased capillary refill to the area which again is an early sign of pressure. Again I do think that her daily activity may be the cause of such. She does have a  history of diabetes with a hemoglobin A1c of 7.8 on 04/11/18. She also has gout and hypertension. The patient does get around in her home according to what her daughter tells me. However she spends most of her day and actually her night as well in her recliner which I think is likely where the pressure is coming from. She tells me that she doesn't sleep in a bed and pretty much unless she's getting up to use the bathroom or eat she is in her recliner. This again I think is likely the biggest issue that we're seeing right now for what's causing the pressure to her gluteal region. Electronic Signature(s) Signed: 04/17/2018 5:20:18 PM By: Worthy Keeler PA-C Entered By: Worthy Keeler on 04/17/2018 17:16:49 [place] ([id number]) -------------------------------------------------------------------------------- Physical Exam Details Patient Name: [name], Cassandra M. Date of Service: 04/17/2018 1:00 PM Medical Record Number: [id number] Patient Account Number: [id number] Date of Birth/Sex: 1924/06/13 (85 y.o. F) Treating RN: Montey Hora Primary Care Provider: Myrtie Hawk Other Clinician: Referring Provider: Myrtie Hawk Treating Provider/Extender: Melburn Hake, Adelbert Gaspard Weeks in Treatment: 0 Constitutional sitting or standing blood pressure is within target range for patient.. pulse regular and within target range for patient.Marland Kitchen respirations regular, non-labored and within target range for patient.Marland Kitchen temperature within target range for patient.. Well- nourished and well-hydrated in no acute distress. Eyes conjunctiva clear no eyelid edema noted. pupils equal round and reactive to light and accommodation. Ears, Nose, Mouth, and Throat no gross abnormality of ear auricles or external auditory canals. normal hearing noted during conversation. mucus membranes moist. Respiratory normal breathing without difficulty. clear to auscultation  bilaterally. Cardiovascular regular rate and rhythm with normal S1, S2. no clubbing, cyanosis, significant edema, <3 sec cap refill. Gastrointestinal (GI) soft, non-tender, non-distended, +BS. no ventral hernia noted. Musculoskeletal Patient unable to walk without assistance. Psychiatric this patient is able to make decisions and demonstrates good insight into disease  process. Alert and Oriented x 3. pleasant and cooperative. Notes Upon inspection today patient's gluteal region actually appears to show no signs of skin breakdown although again she does have blanchable erythema which has a very slow capillary refill this has me concerned about the possibility again of ongoing pressure injury to this region which I think is supported by her story as well today fortunately there's nothing open but again we want to ensure that nothing opens. Therefore I did have a lengthy discussion concerning offloading with the patient and her daughter today. Electronic Signature(s) Signed: 04/17/2018 5:20:18 PM By: Worthy Keeler PA-C Entered By: Worthy Keeler on 04/17/2018 17:17:32 [place] ([id number]) -------------------------------------------------------------------------------- Physician Orders Details Patient Name: [name], Cassandra M. Date of Service: 04/17/2018 1:00 PM Medical Record Number: [id number] Patient Account Number: [id number] Date of Birth/Sex: Mar 01, 1924 (85 y.o. F) Treating RN: Montey Hora Primary Care Provider: Myrtie Hawk Other Clinician: Referring Provider: Myrtie Hawk Treating Provider/Extender: Melburn Hake, Eiza Canniff Weeks in Treatment: 0 Verbal / Phone Orders: No Diagnosis Coding ICD-10 Coding Code Description L89.316 Pressure-induced deep tissue damage of right buttock L89.326 Pressure-induced deep tissue damage of left buttock E11.622 Type 2 diabetes mellitus with other skin ulcer R53.1 Weakness Discharge From [facility] o  Discharge from [place] Signature(s) Signed: 04/17/2018 4:18:49 PM By: Montey Hora Signed: 04/17/2018 5:20:18 PM By: Worthy Keeler PA-C Entered By: Montey Hora on 04/17/2018 14:04:01 [name], [name] ([id number]) -------------------------------------------------------------------------------- Problem List Details Patient Name: [name], Cassandra M. Date of Service: 04/17/2018 1:00 PM Medical Record Number: [id number] Patient Account Number: [id number] Date of Birth/Sex: [birth year] (85 y.o. F) Treating RN: Montey Hora Primary Care Provider: Myrtie Hawk Other Clinician: Referring Provider: Myrtie Hawk Treating Provider/Extender: Melburn Hake, Telford Archambeau Weeks in Treatment: 0 Active Problems ICD-10 Evaluated Encounter Code Description Active Date Today Diagnosis L89.316 Pressure-induced deep tissue damage of right buttock 04/17/2018 No Yes L89.326 Pressure-induced deep tissue damage of left buttock 04/17/2018 No Yes E11.622 Type 2 diabetes mellitus with other skin ulcer 04/17/2018 No Yes R53.1 Weakness 04/17/2018 No Yes Inactive Problems Resolved Problems Electronic Signature(s) Signed: 04/17/2018 5:20:18 PM By: Worthy Keeler PA-C Entered By: Worthy Keeler on 04/17/2018 13:37:26 [name], [name] ([id number]) -------------------------------------------------------------------------------- Progress Note Details Patient Name: [name], Cassandra M. Date of Service: 04/17/2018 1:00 PM Medical Record Number: [id number] Patient Account Number: [id number] Date of Birth/Sex: 1925-01-23 (85 y.o. F) Treating RN: Montey Hora Primary Care Provider: Myrtie Hawk Other Clinician: Referring Provider: Myrtie Hawk Treating Provider/Extender: Melburn Hake, Shakeera Rightmyer Weeks in Treatment: 0 Subjective Chief Complaint Information obtained from Patient Gluteal pressure injury History of Present Illness  (HPI) 04/17/18 on evaluation today patient presents for initial inspection in our clinic concerning issues that she has been having with her gluteal region. Things actually appear to be doing much better at this point although we could go her daughter tells me that the patient was actually having areas that were draining in the gluteal region bilaterally. Fortunately these regions appear to have resolved. Still there is evidence of pressure injury to the gluteal region she has blanchable erythema but decreased capillary refill to the area which again is an early sign of pressure. Again I do think that her daily activity may be the cause of such. She does have a history of diabetes with a hemoglobin A1c of 7.8 on 04/11/18. She also has gout and hypertension. The patient does get around in her home according to what her daughter  tells me. However she spends most of her day and actually her night as well in her recliner which I think is likely where the pressure is coming from. She tells me that she doesn't sleep in a bed and pretty much unless she's getting up to use the bathroom or eat she is in her recliner. This again I think is likely the biggest issue that we're seeing right now for what's causing the pressure to her gluteal region. Wound History Patient reportedly has not tested positive for osteomyelitis. Patient History Information obtained from Patient, Caregiver, Chart. Allergies metformin (Severity: Moderate, Reaction: nausea) Family History Cancer - Child, Diabetes - Siblings, Heart Disease - Father,Child, Hypertension - Child, Thyroid Problems - Siblings, No family history of Kidney Disease, Lung Disease, Seizures, Tuberculosis. Social History Never smoker, Marital Status - Widowed, Alcohol Use - Never, Drug Use - No History, Caffeine Use - Moderate. Medical History Eyes Patient has history of Cataracts - removed, Glaucoma Ear/Nose/Mouth/Throat Denies history of Chronic sinus  problems/congestion, Middle ear problems Hematologic/Lymphatic Denies history of Anemia, Hemophilia, Human Immunodeficiency Virus, Lymphedema, Sickle Cell Disease Respiratory Denies history of Aspiration, Asthma, Chronic Obstructive Pulmonary Disease (COPD), Pneumothorax, Sleep Apnea, Tuberculosis Cardiovascular Patient has history of Arrhythmia, Congestive Heart Failure, Coronary Artery Disease, Hypertension [name], [name]. ([id number]) Denies history of Hypotension, Peripheral Arterial Disease, Peripheral Venous Disease, Phlebitis, Vasculitis Gastrointestinal Denies history of Cirrhosis , Colitis, Crohn s, Hepatitis A, Hepatitis B, Hepatitis C Endocrine Patient has history of Type II Diabetes Denies history of Type I Diabetes Genitourinary Denies history of End Stage Renal Disease Immunological Denies history of Lupus Erythematosus, Raynaud s, Scleroderma Integumentary (Skin) Denies history of History of Burn, History of pressure wounds Musculoskeletal Patient has history of Gout, Osteoarthritis - bilateral knees Denies history of Rheumatoid Arthritis, Osteomyelitis Neurologic Denies history of Dementia, Neuropathy, Quadriplegia, Paraplegia, Seizure Disorder Oncologic Denies history of Received Chemotherapy, Received Radiation Psychiatric Denies history of Anorexia/bulimia, Confinement Anxiety Patient is treated with Oral Agents. Blood sugar is tested. Hospitalization/Surgery History - 11/28/2016, [facility], Heart valve replacement. Review of Systems (ROS) Constitutional Symptoms (General Health) The patient has no complaints or symptoms. Eyes Complains or has symptoms of Glasses / Contacts - glasses. Ear/Nose/Mouth/Throat The patient has no complaints or symptoms. Hematologic/Lymphatic The patient has no complaints or symptoms. Respiratory Complains or has symptoms of Shortness of Breath. Denies complaints or symptoms of Chronic or frequent  coughs. Cardiovascular Denies complaints or symptoms of Chest pain. Gastrointestinal The patient has no complaints or symptoms. Endocrine Complains or has symptoms of Thyroid disease - nodules. Denies complaints or symptoms of Hepatitis. Genitourinary Denies complaints or symptoms of Kidney failure/ Dialysis, Incontinence/dribbling. Immunological Complains or has symptoms of Itching - vaginal. Denies complaints or symptoms of Hives. Integumentary (Skin) Complains or has symptoms of Wounds - sacral, Bleeding or bruising tendency - Asprin 81mg , Breakdown. Denies complaints or symptoms of Swelling. Musculoskeletal Denies complaints or symptoms of Muscle Pain, Muscle Weakness. Neurologic Denies complaints or symptoms of Numbness/parasthesias, Focal/Weakness. Oncologic The patient has no complaints or symptoms. Psychiatric [place] ([id number]) Complains or has symptoms of Anxiety. Denies complaints or symptoms of Claustrophobia. Objective Constitutional sitting or standing blood pressure is within target range for patient.. pulse regular and within target range for patient.Marland Kitchen respirations regular, non-labored and within target range for patient.Marland Kitchen temperature within target range for patient.. Well- nourished and well-hydrated in no acute distress. Vitals Time Taken: 1:01 PM, Height: 60 in, Source: Stated, Weight: 175 lbs, Source: Stated, BMI: 34.2, Temperature: 98.3  F, Pulse: 75 bpm, Respiratory Rate: 16 breaths/min, Blood Pressure: 126/58 mmHg. Eyes conjunctiva clear no eyelid edema noted. pupils equal round and reactive to light and accommodation. Ears, Nose, Mouth, and Throat no gross abnormality of ear auricles or external auditory canals. normal hearing noted during conversation. mucus membranes moist. Respiratory normal breathing without difficulty. clear to auscultation bilaterally. Cardiovascular regular rate and rhythm with normal S1, S2. no clubbing,  cyanosis, significant edema, Gastrointestinal (GI) soft, non-tender, non-distended, +BS. no ventral hernia noted. Musculoskeletal Patient unable to walk without assistance. Psychiatric this patient is able to make decisions and demonstrates good insight into disease process. Alert and Oriented x 3. pleasant and cooperative. General Notes: Upon inspection today patient's gluteal region actually appears to show no signs of skin breakdown although again she does have blanchable erythema which has a very slow capillary refill this has me concerned about the possibility again of ongoing pressure injury to this region which I think is supported by her story as well today fortunately there's nothing open but again we want to ensure that nothing opens. Therefore I did have a lengthy discussion concerning offloading with the patient and her daughter today. Other Condition(s) Patient presents with Suspected Deep Tissue Injury located on the Bilateral Gluteus. The skin appearance exhibited: Dry/Scaly, Hemosiderin Staining, Induration. The skin appearance did not exhibit: Atrophie Blanche, Callus, Crepitus, Cyanosis, Ecchymosis, Erythema, Excoriation, Friable, Maceration, Mottled, Pallor, Rash, Rubor, Scarring. Skin temperature was noted as No Abnormality. [name], [name] ([id number]) Assessment Active Problems ICD-10 Pressure-induced deep tissue damage of right buttock Pressure-induced deep tissue damage of left buttock Type 2 diabetes mellitus with other skin ulcer Weakness Plan Discharge From [facility]: Discharge from [place] Again during this consult actually did have a very lengthy discussion about the possibility of getting a Roho cushion for her chair along with changing positions frequently throughout the day. This would include not sleeping in her recliner but rather sleeping in her bed which will help tremendously and of itself. Subsequently I suggest you  not stay in the chair longer than two hours and really I would like for her to get up around each hour to see if she can move around and take pressure off of the gluteal region. Subsequently I also think that it may be good for her after a couple hours in her chair spent some time on her couch which would provide a different position and location for her to sit without having to necessarily just go lay down. All of this I think will lend itself to preventing further breakdown in her ending up with a more significant wound which could be detrimental. All this was discussed in great detail with the patient and her daughter. I do believe that they understand the significance here. We will subsequently see her back only as needed at this point hopefully this will not be needed. Otherwise it was a pleasure meeting her today. Electronic Signature(s) Signed: 04/17/2018 5:20:18 PM By: Worthy Keeler PA-C Entered By: Worthy Keeler on 04/17/2018 17:19:26 [place] ([id number]) -------------------------------------------------------------------------------- ROS/PFSH Details Patient Name: [name], Cassandra M. Date of Service: 04/17/2018 1:00 PM Medical Record Number: [id number] Patient Account Number: [id number] Date of Birth/Sex: 09/06/1924 (85 y.o. F) Treating RN: Army Melia Primary Care Provider: Myrtie Hawk Other Clinician: Referring Provider: Myrtie Hawk Treating Provider/Extender: Melburn Hake, Sheilah Rayos Weeks in Treatment: 0 Information Obtained From Patient Caregiver Chart Wound History Do you currently have one or more open woundso  No Have you tested positive for osteomyelitis (bone infection)o No Eyes Complaints and Symptoms: Positive for: Glasses / Contacts - glasses Medical History: Positive for: Cataracts - removed; Glaucoma Hematologic/Lymphatic Complaints and Symptoms: No Complaints or Symptoms Complaints and Symptoms: Negative for: Bleeding  / Clotting Disorders; Human Immunodeficiency Virus Medical History: Negative for: Anemia; Hemophilia; Human Immunodeficiency Virus; Lymphedema; Sickle Cell Disease Respiratory Complaints and Symptoms: Positive for: Shortness of Breath Negative for: Chronic or frequent coughs Medical History: Negative for: Aspiration; Asthma; Chronic Obstructive Pulmonary Disease (COPD); Pneumothorax; Sleep Apnea; Tuberculosis Cardiovascular Complaints and Symptoms: Negative for: Chest pain Medical History: Positive for: Arrhythmia; Congestive Heart Failure; Coronary Artery Disease; Hypertension Negative for: Hypotension; Peripheral Arterial Disease; Peripheral Venous Disease; Phlebitis; Vasculitis Endocrine Complaints and Symptoms: Positive for: Thyroid disease - nodules Negative for: Hepatitis [name], [name] ([id number]) Medical History: Positive for: Type II Diabetes Negative for: Type I Diabetes Time with diabetes: 20 years Treated with: Oral agents Blood sugar tested every day: Yes Tested : twice daily Genitourinary Complaints and Symptoms: Negative for: Kidney failure/ Dialysis; Incontinence/dribbling Medical History: Negative for: End Stage Renal Disease Immunological Complaints and Symptoms: Positive for: Itching - vaginal Negative for: Hives Medical History: Negative for: Lupus Erythematosus; Raynaudos; Scleroderma Integumentary (Skin) Complaints and Symptoms: Positive for: Wounds - sacral; Bleeding or bruising tendency - Asprin 81mg ; Breakdown Negative for: Swelling Medical History: Negative for: History of Burn; History of pressure wounds Musculoskeletal Complaints and Symptoms: Negative for: Muscle Pain; Muscle Weakness Medical History: Positive for: Gout; Osteoarthritis - bilateral knees Negative for: Rheumatoid Arthritis; Osteomyelitis Neurologic Complaints and Symptoms: Negative for: Numbness/parasthesias; Focal/Weakness Medical History: Negative for:  Dementia; Neuropathy; Quadriplegia; Paraplegia; Seizure Disorder Psychiatric Complaints and Symptoms: Positive for: Anxiety Negative for: Claustrophobia Medical History: Negative for: Anorexia/bulimia; Confinement Anxiety Cassandra KAYTIE, RATCLIFFE ([id number]) Constitutional Symptoms (General Health) Complaints and Symptoms: No Complaints or Symptoms Ear/Nose/Mouth/Throat Complaints and Symptoms: No Complaints or Symptoms Medical History: Negative for: Chronic sinus problems/congestion; Middle ear problems Gastrointestinal Complaints and Symptoms: No Complaints or Symptoms Medical History: Negative for: Cirrhosis ; Colitis; Crohnos; Hepatitis A; Hepatitis B; Hepatitis C Oncologic Complaints and Symptoms: No Complaints or Symptoms Medical History: Negative for: Received Chemotherapy; Received Radiation HBO Extended History Items Eyes: Eyes: Cataracts Glaucoma Immunizations Pneumococcal Vaccine: Received Pneumococcal Vaccination: Yes Implantable Devices Hospitalization / Surgery History Name of Hospital Purpose of Hospitalization/Surgery Date [facility] Heart valve replacement 11/28/2016 Family and Social History Cancer: Yes - Child; Diabetes: Yes - Siblings; Heart Disease: Yes - Father,Child; Hypertension: Yes - Child; Kidney Disease: No; Lung Disease: No; Seizures: No; Thyroid Problems: Yes - Siblings; Tuberculosis: No; Never smoker; Marital Status - Widowed; Alcohol Use: Never; Drug Use: No History; Caffeine Use: Moderate; Advanced Directives: Yes; Do not resuscitate: No; Living Will: Yes; Medical Power of Attorney: Yes - Rich Number) Signed: 04/17/2018 5:20:18 PM By: Worthy Keeler PA-C Signed: 04/20/2018 4:09:31 PM By: Army Melia Entered By: Army Melia on 04/17/2018 13:24:11 [name], [name] ([id number]) -------------------------------------------------------------------------------- SuperBill Details Patient Name: [name],  Shantelle M. Date of Service: 04/17/2018 Medical Record Number: [id number] Patient Account Number: [id number] Date of Birth/Sex: Dec 12, 1924 (85 y.o. F) Treating RN: Montey Hora Primary Care Provider: Myrtie Hawk Other Clinician: Referring Provider: Myrtie Hawk Treating Provider/Extender: Melburn Hake, Ingris Pasquarella Weeks in Treatment: 0 Diagnosis Coding ICD-10 Codes Code Description L89.316 Pressure-induced deep tissue damage of right buttock L89.326 Pressure-induced deep tissue damage of left buttock E11.622 Type 2 diabetes mellitus with other skin ulcer R53.1 Weakness Facility Procedures CPT4 Code: [id number] Description:  [place] VISIT-LEV 3 EST PT Modifier: Quantity: 1 Physician Procedures CPT4 Code: [id number] Description: WC PHYS LEVEL 3 o NEW PT ICD-10 Diagnosis Description L89.316 Pressure-induced deep tissue damage of right buttock L89.326 Pressure-induced deep tissue damage of left buttock E11.622 Type 2 diabetes mellitus with other skin ulcer R53.1  Weakness Modifier: Quantity: 1 Electronic Signature(s) Signed: 04/17/2018 5:20:18 PM By: Worthy Keeler PA-C Entered By: Worthy Keeler on 04/17/2018 17:19:38

## 2018-04-21 NOTE — Progress Notes (Signed)
[name], [name] ([id number]) Visit Report for 04/17/2018 Allergy List Details Patient Name: [name]. Date of Service: 04/17/2018 1:00 PM Medical Record Number: [id number] Patient Account Number: [id number] Date of Birth/Sex: 08/15/1924 (85 y.o. F) Treating RN: Army Melia Primary Care Shandora Koogler: Myrtie Hawk Other Clinician: Referring Melodye Swor: Myrtie Hawk Treating Irl Bodie/Extender: STONE III, HOYT Weeks in Treatment: 0 Allergies Active Allergies metformin Reaction: nausea Severity: Moderate Allergy Notes Electronic Signature(s) Signed: 04/20/2018 4:09:31 PM By: Army Melia Entered By: Army Melia on 04/17/2018 13:06:21 [place], [name] ([id number]) -------------------------------------------------------------------------------- Arrival Information Details Patient Name: [name], Cassandra M. Date of Service: 04/17/2018 1:00 PM Medical Record Number: [id number] Patient Account Number: [id number] Date of Birth/Sex: [birth year] (85 y.o. F) Treating RN: Army Melia Primary Care Edwinna Rochette: Myrtie Hawk Other Clinician: Referring Nesbit Michon: Myrtie Hawk Treating Keigo Whalley/Extender: Melburn Hake, HOYT Weeks in Treatment: 0 Visit Information Patient Arrived: Wheel Chair Arrival Time: 12:59 Accompanied By: daughter Transfer Assistance: None Patient Identification Verified: Yes Electronic Signature(s) Signed: 04/20/2018 4:09:31 PM By: Army Melia Entered By: Army Melia on 04/17/2018 12:59:38 [name], [name] ([id number]) -------------------------------------------------------------------------------- Clinic Level of Care Assessment Details Patient Name: [name], Cassandra M. Date of Service: 04/17/2018 1:00 PM Medical Record Number: [id number] Patient Account Number: [id number] Date of Birth/Sex: 03-31-1924 (85 y.o. F) Treating RN: Montey Hora Primary Care Lovelyn Sheeran: Myrtie Hawk  Other Clinician: Referring Markeese Boyajian: Myrtie Hawk Treating Kewanda Poland/Extender: Melburn Hake, HOYT Weeks in Treatment: 0 Clinic Level of Care Assessment Items TOOL 2 Quantity Score []  - Use when only an EandM is performed on the INITIAL visit 0 ASSESSMENTS - Nursing Assessment / Reassessment X - General Physical Exam (combine w/ comprehensive assessment (listed just below) when 1 20 performed on new pt. evals) X- 1 25 Comprehensive Assessment (HX, ROS, Risk Assessments, Wounds Hx, etc.) ASSESSMENTS - Wound and Skin Assessment / Reassessment []  - Simple Wound Assessment / Reassessment - one wound 0 []  - 0 Complex Wound Assessment / Reassessment - multiple wounds X- 1 10 Dermatologic / Skin Assessment (not related to wound area) ASSESSMENTS - Ostomy and/or Continence Assessment and Care []  - Incontinence Assessment and Management 0 []  - 0 Ostomy Care Assessment and Management (repouching, etc.) PROCESS - Coordination of Care X - Simple Patient / Family Education for ongoing care 1 15 []  - 0 Complex (extensive) Patient / Family Education for ongoing care X- 1 10 Staff obtains Programmer, systems, Records, Test Results / Process Orders []  - 0 Staff telephones HHA, Nursing Homes / Clarify orders / etc []  - 0 Routine Transfer to another Facility (non-emergent condition) []  - 0 Routine Hospital Admission (non-emergent condition) X- 1 15 New Admissions / Biomedical engineer / Ordering NPWT, Apligraf, etc. []  - 0 Emergency Hospital Admission (emergent condition) X- 1 10 Simple Discharge Coordination []  - 0 Complex (extensive) Discharge Coordination PROCESS - Special Needs []  - Pediatric / Minor Patient Management 0 []  - 0 Isolation Patient Management [name], [name] ([id number]) []  - 0 Hearing / Language / Visual special needs []  - 0 Assessment of Community assistance (transportation, D/C planning, etc.) []  - 0 Additional assistance / Altered mentation []  -  0 Support Surface(s) Assessment (bed, cushion, seat, etc.) INTERVENTIONS - Wound Cleansing / Measurement X - Wound Imaging (photographs - any number of wounds) 1 5 []  - 0 Wound Tracing (instead of photographs) []  - 0 Simple Wound Measurement - one wound []  - 0 Complex Wound Measurement - multiple wounds []  - 0 Simple Wound Cleansing - one wound []  -  0 Complex Wound Cleansing - multiple wounds INTERVENTIONS - Wound Dressings []  - Small Wound Dressing one or multiple wounds 0 []  - 0 Medium Wound Dressing one or multiple wounds []  - 0 Large Wound Dressing one or multiple wounds []  - 0 Application of Medications - injection INTERVENTIONS - Miscellaneous []  - External ear exam 0 []  - 0 Specimen Collection (cultures, biopsies, blood, body fluids, etc.) []  - 0 Specimen(s) / Culture(s) sent or taken to Lab for analysis []  - 0 Patient Transfer (multiple staff / Harrel Lemon Lift / Similar devices) []  - 0 Simple Staple / Suture removal (25 or less) []  - 0 Complex Staple / Suture removal (26 or more) []  - 0 Hypo / Hyperglycemic Management (close monitor of Blood Glucose) []  - 0 Ankle / Brachial Index (ABI) - do not check if billed separately Has the patient been seen at the hospital within the last three years: Yes Total Score: 110 Level Of Care: New/Established - Level 3 Electronic Signature(s) Signed: 04/17/2018 4:18:49 PM By: Montey Hora Entered By: Montey Hora on 04/17/2018 14:03:02 [name], [name] ([id number]) -------------------------------------------------------------------------------- Encounter Discharge Information Details Patient Name: [name], Cassandra M. Date of Service: 04/17/2018 1:00 PM Medical Record Number: [id number] Patient Account Number: [id number] Date of Birth/Sex: 1924/03/18 (85 y.o. F) Treating RN: Montey Hora Primary Care Ryian Lynde: Myrtie Hawk Other Clinician: Referring Bearett Porcaro: Myrtie Hawk Treating  Ulices Maack/Extender: Melburn Hake, HOYT Weeks in Treatment: 0 Encounter Discharge Information Items Discharge Condition: Stable Ambulatory Status: Wheelchair Discharge Destination: Home Transportation: Private Auto Accompanied By: daughter Schedule Follow-up Appointment: No Clinical Summary of Care: Electronic Signature(s) Signed: 04/17/2018 4:18:49 PM By: Montey Hora Entered By: Montey Hora on 04/17/2018 14:04:48 [place], [name] ([id number]) -------------------------------------------------------------------------------- Lower Extremity Assessment Details Patient Name: [name], Cassandra M. Date of Service: 04/17/2018 1:00 PM Medical Record Number: [id number] Patient Account Number: [id number] Date of Birth/Sex: 1924-03-27 (85 y.o. F) Treating RN: Army Melia Primary Care Wilena Tyndall: Myrtie Hawk Other Clinician: Referring Ellenie Salome: Myrtie Hawk Treating Westley Blass/Extender: Melburn Hake, HOYT Weeks in Treatment: 0 Electronic Signature(s) Signed: 04/20/2018 4:09:31 PM By: Army Melia Entered By: Army Melia on 04/17/2018 13:30:32 [place], [name] ([id number]) -------------------------------------------------------------------------------- Multi Wound Chart Details Patient Name: [name], Cassandra M. Date of Service: 04/17/2018 1:00 PM Medical Record Number: [id number] Patient Account Number: [id number] Date of Birth/Sex: 1924-10-20 (85 y.o. F) Treating RN: Montey Hora Primary Care Hussein Macdougal: Myrtie Hawk Other Clinician: Referring Meribeth Vitug: Myrtie Hawk Treating Colbi Schiltz/Extender: Melburn Hake, HOYT Weeks in Treatment: 0 Vital Signs Height(in): 60 Pulse(bpm): 75 Weight(lbs): 175 Blood Pressure(mmHg): 126/58 Body Mass Index(BMI): 34 Temperature(F): 98.3 Respiratory Rate 16 (breaths/min): Wound Assessments Treatment Notes Electronic Signature(s) Signed: 04/17/2018 4:18:49 PM By: Montey Hora Entered By:  Montey Hora on 04/17/2018 13:54:17 [name], [name] ([id number]) -------------------------------------------------------------------------------- Non-Wound Condition Assessment Details Patient Name: [name], Cassandra M. Date of Service: 04/17/2018 1:00 PM Medical Record Number: [id number] Patient Account Number: [id number] Date of Birth/Sex: 1924/03/25 (85 y.o. F) Treating RN: Army Melia Primary Care Cara Thaxton: Myrtie Hawk Other Clinician: Referring Donesha Wallander: Myrtie Hawk Treating Mazen Marcin/Extender: STONE III, HOYT Weeks in Treatment: 0 Non-Wound Condition: Condition: Suspected Deep Tissue Injury Location: Gluteus Side: Bilateral Photos Photo Uploaded By: Army Melia on 04/17/2018 15:42:32 Periwound Skin Texture Texture Color No Abnormalities Noted: No No Abnormalities Noted: No Callus: No Atrophie Blanche: No Crepitus: No Cyanosis: No Excoriation: No Ecchymosis: No Friable: No Erythema: No Induration: Yes Hemosiderin Staining: Yes Rash: No Mottled: No Scarring: No Pallor: No Rubor: No Moisture No Abnormalities Noted: No  Temperature / Pain Dry / Scaly: Yes Temperature: No Abnormality Maceration: No Electronic Signature(s) Signed: 04/20/2018 4:09:31 PM By: Army Melia Entered By: Army Melia on 04/17/2018 13:30:16 [name], [name] ([id number]) -------------------------------------------------------------------------------- Pain Assessment Details Patient Name: [name], Cassandra M. Date of Service: 04/17/2018 1:00 PM Medical Record Number: [id number] Patient Account Number: [id number] Date of Birth/Sex: 1924-08-22 (85 y.o. F) Treating RN: Army Melia Primary Care Lynnley Doddridge: Myrtie Hawk Other Clinician: Referring Jaidan Stachnik: Myrtie Hawk Treating Ximenna Fonseca/Extender: Melburn Hake, HOYT Weeks in Treatment: 0 Active Problems Location of Pain Severity and Description of Pain Patient Has Paino No Site  Locations Pain Management and Medication Current Pain Management: Electronic Signature(s) Signed: 04/20/2018 4:09:31 PM By: Army Melia Entered By: Army Melia on 04/17/2018 13:03:39 [name], [name] ([id number]) -------------------------------------------------------------------------------- Patient/Caregiver Education Details Patient Name: [name], Cassandra M. Date of Service: 04/17/2018 1:00 PM Medical Record Number: [id number] Patient Account Number: [id number] Date of Birth/Gender: [birth year] (85 y.o. F) Treating RN: Montey Hora Primary Care Physician: Myrtie Hawk Other Clinician: Referring Physician: Myrtie Hawk Treating Physician/Extender: Sharalyn Ink in Treatment: 0 Education Assessment Education Provided To: Patient and Caregiver Education Topics Provided Pressure: Handouts: Preventing Pressure Ulcers Methods: Explain/Verbal Responses: State content correctly Electronic Signature(s) Signed: 04/17/2018 4:18:49 PM By: Montey Hora Entered By: Montey Hora on 04/17/2018 14:03:25 [name], [name] ([id number]) -------------------------------------------------------------------------------- Vitals Details Patient Name: [name], Cassandra M. Date of Service: 04/17/2018 1:00 PM Medical Record Number: [id number] Patient Account Number: [id number] Date of Birth/Sex: 1925-02-05 (85 y.o. F) Treating RN: Army Melia Primary Care Idalia Allbritton: Myrtie Hawk Other Clinician: Referring Lysander Calixte: Myrtie Hawk Treating Lynesha Bango/Extender: Melburn Hake, HOYT Weeks in Treatment: 0 Vital Signs Time Taken: 13:01 Temperature (F): 98.3 Height (in): 60 Pulse (bpm): 75 Source: Stated Respiratory Rate (breaths/min): 16 Weight (lbs): 175 Blood Pressure (mmHg): 126/58 Source: Stated Reference Range: 80 - 120 mg / dl Body Mass Index (BMI): 34.2 Electronic Signature(s) Signed: 04/20/2018 4:09:31 PM By: Army Melia Entered By: Army Melia on 04/17/2018 13:04:17

## 2018-04-21 NOTE — Progress Notes (Signed)
[name], [name] ([id number]) Visit Report for 04/17/2018 Abuse/Suicide Risk Screen Details Patient Name: [name], [name]. Date of Service: 04/17/2018 1:00 PM Medical Record Number: [id number] Patient Account Number: [id number] Date of Birth/Sex: March 29, 1924 (85 y.o. F) Treating RN: Army Melia Primary Care Bryce Cheever: Myrtie Hawk Other Clinician: Referring Salvatore Poe: Myrtie Hawk Treating Tomeca Helm/Extender: Melburn Hake, HOYT Weeks in Treatment: 0 Abuse/Suicide Risk Screen Items Answer ABUSE/SUICIDE RISK SCREEN: Has anyone close to you tried to hurt or harm you recentlyo No Do you feel uncomfortable with anyone in your familyo No Has anyone forced you do things that you didnot want to doo No Do you have any thoughts of harming yourselfo No Patient displays signs or symptoms of abuse and/or neglect. No Electronic Signature(s) Signed: 04/20/2018 4:09:31 PM By: Army Melia Entered By: Army Melia on 04/17/2018 13:24:36 [name], [name] ([id number]) -------------------------------------------------------------------------------- Activities of Daily Living Details Patient Name: [name], Cassandra M. Date of Service: 04/17/2018 1:00 PM Medical Record Number: [id number] Patient Account Number: [id number] Date of Birth/Sex: [birth year] (85 y.o. F) Treating RN: Army Melia Primary Care Aashika Carta: Myrtie Hawk Other Clinician: Referring Latese Dufault: Myrtie Hawk Treating Nahome Bublitz/Extender: Melburn Hake, HOYT Weeks in Treatment: 0 Activities of Daily Living Items Answer Activities of Daily Living (Please select one for each item) Drive Automobile Not Able Take Medications Completely Able Use Telephone Completely Able Care for Appearance Need Assistance Use Toilet Need Assistance Bath / Shower Need Assistance Dress Self Need Assistance Feed Self Need Assistance Walk Need Assistance Get In / Out Bed Need Assistance Housework  Need Assistance Prepare Meals Need Assistance Handle Money Need Assistance Shop for Self Need Assistance Electronic Signature(s) Signed: 04/20/2018 4:09:31 PM By: Army Melia Entered By: Army Melia on 04/17/2018 13:25:34 [name], [name] ([id number]) -------------------------------------------------------------------------------- Education Assessment Details Patient Name: [name], Cassandra M. Date of Service: 04/17/2018 1:00 PM Medical Record Number: [id number] Patient Account Number: [id number] Date of Birth/Sex: [birth year] (85 y.o. F) Treating RN: Army Melia Primary Care Ulrick Methot: Myrtie Hawk Other Clinician: Referring Hogan Hoobler: Myrtie Hawk Treating Samanyu Tinnell/Extender: Melburn Hake, HOYT Weeks in Treatment: 0 Primary Learner Assessed: Patient Learning Preferences/Education Level/Primary Language Learning Preference: Explanation, Demonstration Highest Education Level: Grade School Preferred Language: English Cognitive Barrier Assessment/Beliefs Language Barrier: No Translator Needed: No Memory Deficit: No Emotional Barrier: No Cultural/Religious Beliefs Affecting Medical Care: No Physical Barrier Assessment Impaired Vision: Yes Glasses Impaired Hearing: No Decreased Hand dexterity: No Knowledge/Comprehension Assessment Knowledge Level: High Comprehension Level: High Ability to understand written High instructions: Ability to understand verbal High instructions: Motivation Assessment Anxiety Level: Calm Cooperation: Cooperative Education Importance: Acknowledges Need Interest in Health Problems: Asks Questions Perception: Coherent Willingness to Engage in Self- High Management Activities: Readiness to Engage in Self- High Management Activities: Electronic Signature(s) Signed: 04/20/2018 4:09:31 PM By: Army Melia Entered By: Army Melia on 04/17/2018 13:26:48 [name], [name]  ([id number]) -------------------------------------------------------------------------------- Fall Risk Assessment Details Patient Name: [name], Cassandra M. Date of Service: 04/17/2018 1:00 PM Medical Record Number: [id number] Patient Account Number: [id number] Date of Birth/Sex: [birth year] (85 y.o. F) Treating RN: Army Melia Primary Care Roslin Norwood: Myrtie Hawk Other Clinician: Referring Remas Sobel: Myrtie Hawk Treating Trelyn Vanderlinde/Extender: Melburn Hake, HOYT Weeks in Treatment: 0 Fall Risk Assessment Items Have you had 2 or more falls in the last 12 monthso 0 No Have you had any fall that resulted in injury in the last 12 monthso 0 No FALL RISK ASSESSMENT: History of falling - immediate or within 3 months 0 No Secondary diagnosis 0 No Ambulatory aid  None/bed rest/wheelchair/nurse 0 No Crutches/cane/walker 15 Yes Furniture 0 No IV Access/Saline Lock 0 No Gait/Training Normal/bed rest/immobile 0 Yes Weak 0 No Impaired 0 No Mental Status Oriented to own ability 0 No Electronic Signature(s) Signed: 04/20/2018 4:09:31 PM By: Army Melia Entered By: Army Melia on 04/17/2018 13:27:26 [name], [name] ([id number]) -------------------------------------------------------------------------------- Foot Assessment Details Patient Name: [name], Cassandra M. Date of Service: 04/17/2018 1:00 PM Medical Record Number: [id number] Patient Account Number: [id number] Date of Birth/Sex: Sep 28, 1924 (85 y.o. F) Treating RN: Army Melia Primary Care Taeya Theall: Myrtie Hawk Other Clinician: Referring Tamberly Pomplun: Myrtie Hawk Treating Orra Nolde/Extender: Melburn Hake, HOYT Weeks in Treatment: 0 Foot Assessment Items Site Locations + = Sensation present, - = Sensation absent, C = Callus, U = Ulcer R = Redness, W = Warmth, M = Maceration, PU = Pre-ulcerative lesion F = Fissure, S = Swelling, D = Dryness Assessment Right: Left: Other Deformity: No  No Prior Foot Ulcer: No No Prior Amputation: No No Charcot Joint: No No Ambulatory Status: Ambulatory With Help Assistance Device: Walker Gait: Steady Electronic Signature(s) Signed: 04/20/2018 4:09:31 PM By: Army Melia Entered By: Army Melia on 04/17/2018 13:29:09 [name], [name] ([id number]) -------------------------------------------------------------------------------- Nutrition Risk Assessment Details Patient Name: [name], [name] Date of Service: 04/17/2018 1:00 PM Medical Record Number: [id number] Patient Account Number: [id number] Date of Birth/Sex: 04-Oct-1924 (85 y.o. F) Treating RN: Army Melia Primary Care Grettell Ransdell: Myrtie Hawk Other Clinician: Referring Damico Partin: Myrtie Hawk Treating Linde Wilensky/Extender: Melburn Hake, HOYT Weeks in Treatment: 0 Height (in): 60 Weight (lbs): 175 Body Mass Index (BMI): 34.2 Nutrition Risk Assessment Items NUTRITION RISK SCREEN: I have an illness or condition that made me change the kind and/or amount of 0 No food I eat I eat fewer than two meals per day 0 No I eat few fruits and vegetables, or milk products 0 No I have three or more drinks of beer, liquor or wine almost every day 0 No I have tooth or mouth problems that make it hard for me to eat 0 No I don't always have enough money to buy the food I need 0 No I eat alone most of the time 0 No I take three or more different prescribed or over-the-counter drugs a day 1 Yes Without wanting to, I have lost or gained 10 pounds in the last six months 2 Yes I am not always physically able to shop, cook and/or feed myself 0 No Nutrition Protocols Good Risk Protocol Provide education on elevated blood sugars and Moderate Risk Protocol 0 impact on wound healing, as applicable Electronic Signature(s) Signed: 04/20/2018 4:09:31 PM By: Army Melia Entered By: Army Melia on 04/17/2018 13:28:51

## 2018-04-24 ENCOUNTER — Telehealth: Payer: Self-pay | Admitting: Cardiovascular Disease

## 2018-04-24 ENCOUNTER — Ambulatory Visit: Admission: RE | Admit: 2018-04-24 | Payer: PPO | Source: Ambulatory Visit

## 2018-04-24 NOTE — Telephone Encounter (Signed)
Ok to hold lisinopril

## 2018-04-24 NOTE — Telephone Encounter (Signed)
Patient daughter calling States that patient BP has been running low and would like to review medications with nurse Please call to discuss

## 2018-04-24 NOTE — Telephone Encounter (Signed)
Call to daughter, [name]. Pt w c/o low BP, SOB, cough and LE swelling. Per daughter pressures are lower in the evenings. Lower values include, 98/50, 108/60. HR range 68-73.  Pt had recent uterine prolapse with vaginal bleeding Thursday - sat. appt was made with PCP but pt did not go d/t weather and "getting tired of going to doctors".  Per daughter pt has been making comments such as "I am done fighting, im ready to be with the [place]". She had an appt today that she refused.   Daughter has considered palliative care but is requesting more information. If nothing else she would like information on consolidating care and possibly be seen in her home. [name] reports that she had conversation with her mother today and is willing to go to the hospital if she needs to in the future but is "tired of all these appts".   Message routed to provider for advice on medications changes, if any and/or palliative care suggestions.

## 2018-04-24 NOTE — Telephone Encounter (Signed)
Spoke with patient's daughter. She verbalized understanding to hold the lisinopril. She plans to call PCP in the morning.

## 2018-04-24 NOTE — Telephone Encounter (Signed)
I'm sorry to hear that she is feeling poorly.  If she is acutely ill and seeking help, than certainly an ER referral is appropriate.  If however she is stable but potentially seeking palliative care information, I think her PCP is better suited to help her with that.  Is it possible to engage case mgmt on [name]'s behalf?

## 2018-04-24 NOTE — Telephone Encounter (Signed)
Patient's daughter verbalized understanding of recommendations and to call PCP in regards to information pertaining to palliative care. She did want to know should they decrease any of her BP medications since BP had been running low. She was me to readdress this with provider. Routing to provider for review (see previous entries).

## 2018-04-29 ENCOUNTER — Other Ambulatory Visit: Payer: Self-pay | Admitting: Internal Medicine

## 2018-04-29 DIAGNOSIS — R1030 Lower abdominal pain, unspecified: Secondary | ICD-10-CM

## 2018-04-29 DIAGNOSIS — K5790 Diverticulosis of intestine, part unspecified, without perforation or abscess without bleeding: Secondary | ICD-10-CM

## 2018-04-29 DIAGNOSIS — N814 Uterovaginal prolapse, unspecified: Secondary | ICD-10-CM

## 2018-04-29 DIAGNOSIS — N939 Abnormal uterine and vaginal bleeding, unspecified: Secondary | ICD-10-CM

## 2018-04-29 NOTE — Progress Notes (Signed)
MRN : [id number]  [name] [name] is a 85 y.o. (1924/05/31) female who presents with chief complaint of No chief complaint on file. Marland Kitchen  History of Present Illness:    The patient is seen for evaluation of painful lower extremities and diminished pulses. Patient notes the pain is always associated with activity and is very consistent day today. Typically, the pain occurs at less than one block, progress is as activity continues to the point that the patient must stop walking. Resting including standing still for several minutes allowed resumption of the activity and the ability to walk a similar distance before stopping again. Uneven terrain and inclined shorten the distance. The pain has been progressive over the past several years. The patient states the inability to walk is now having a profound negative impact on quality of life and daily activities.  The patient denies rest pain or dangling of an extremity off the side of the bed during the night for relief. No open wounds or sores at this time. No prior interventions or surgeries.  No history of back problems or DJD of the lumbar sacral spine.   Patient also has problems with leg swelling. The patient first noticed the swelling remotely but is now concerned because of a significant increase in the overall edema. The swelling is associated with pain and discoloration. The patient notes that in the morning the legs are improved but they steadily worsened throughout the course of the day. Elevation makes the legs better, dependency makes them much worse.   There is no history of ulcerations associated with the swelling.   The patient denies any recent changes in their medications.  The patient has been wearing graduated compression on a daily basis.  The patient has no had any past angiography, interventions or vascular surgery.  The patient denies a history of DVT or PE. There is no prior history of phlebitis. There is no  history of primary lymphedema.  There is no history of radiation treatment to the groin or pelvis No history of malignancies. No history of trauma or groin or pelvic surgery. No history of foreign travel or parasitic infections area   The patient's blood pressure has been stable and relatively well controlled. The patient denies amaurosis fugax or recent TIA symptoms. There are no recent neurological changes noted. The patient denies history of DVT, PE or superficial thrombophlebitis. The patient denies recent episodes of angina or shortness of breath.     Past Medical History:  Diagnosis Date  . Anemia   . Carotid arterial disease ([place])    a. 08/2014 U/S: Bilateral < 50% stenosis. Patent vertebrals w/ antegrade flow.   . Cervical spine arthritis   . Chronic diastolic CHF (congestive heart failure) ([place])    a. echo 2014: EF 55-60%, DD;  b. 08/2014 Echo: EF 55-60%, no RWMA, GR1DD; c. 02/2016 Echo: EF 65-70%, Gr1 DD.  Marland Kitchen Cognitive impairment   . Depression   . Diabetes mellitus without complication ([place])   . Essential hypertension   . GERD (gastroesophageal reflux disease)   . Glaucoma   . Gout   . History of renal impairment   . Hyperlipidemia   . Hypotension    a. Related to Norvasc - 11/2014 ED visit.  . Multinodular goiter    a. Noted incidentally on CT 07/2012 and carotid U/S 08/2014;  b. Nl TSH 11/2014.  Marland Kitchen Paroxysmal SVT (supraventricular tachycardia) (HCC)    a. on Toprol   . Prolapsed uterus   .  Severe aortic stenosis    a. 11/15/16: s/p TAVR Edwards Sapien 3 THV (size 23 mm, model # 9600TFX, serial # [id number])  . Stroke [facility])    a. CT head 07/2014 showed small thalamic infarct  . Vitamin deficiency     Past Surgical History:  Procedure Laterality Date  . EYE SURGERY     unsure of what procedure, did know that laser was used  . IR RADIOLOGY PERIPHERAL GUIDED IV START  11/03/2016  . IR US GUIDE VASC ACCESS RIGHT  11/03/2016  . RIGHT/LEFT HEART CATH AND CORONARY ANGIOGRAPHY  N/A 10/26/2016   Procedure: RIGHT/LEFT HEART CATH AND CORONARY ANGIOGRAPHY;  Surgeon: [name], MD;  Location: [facility] [facility];  Service: Cardiovascular;  Laterality: N/A;  . TEE WITHOUT CARDIOVERSION N/A 03/31/2016   Procedure: TRANSESOPHAGEAL ECHOCARDIOGRAM (TEE);  Surgeon: Minna Merritts, MD;  Location: [facility];  Service: Cardiovascular;  Laterality: N/A;  . TEE WITHOUT CARDIOVERSION N/A 11/15/2016   Procedure: TRANSESOPHAGEAL ECHOCARDIOGRAM (TEE);  Surgeon: [name], MD;  Location: [place];  Service: Open Heart Surgery;  Laterality: N/A;  . TRANSCATHETER AORTIC VALVE REPLACEMENT, TRANSFEMORAL N/A 11/15/2016   Procedure: TRANSCATHETER AORTIC VALVE REPLACEMENT, TRANSFEMORAL;  Surgeon: [name], MD;  Location: [place];  Service: Open Heart Surgery;  Laterality: N/A;  . TUBAL LIGATION      Social History Social History   Tobacco Use  . Smoking status: Never Smoker  . Smokeless tobacco: Never Used  Substance Use Topics  . Alcohol use: No  . Drug use: No    Family History Family History  Problem Relation Age of Onset  . Glaucoma Mother   . Diabetes Mother   . Peptic Ulcer Father   . Colitis Father   No family history of bleeding/clotting disorders, porphyria or autoimmune disease   Allergies  Allergen Reactions  . Metformin Hcl Other (See Comments)    GI upset  . Metformin Other (See Comments), Diarrhea and Nausea And Vomiting  . Metformin And Related Diarrhea     REVIEW OF SYSTEMS (Negative unless checked)  Constitutional: [] Weight loss  [] Fever  [] Chills Cardiac: [] Chest pain   [] Chest pressure   [] Palpitations   [] Shortness of breath when laying flat   [x] Shortness of breath with exertion. Vascular:  [x] Pain in legs with walking   [x] Pain in legs at rest  [] History of DVT   [] Phlebitis   [x] Swelling in legs   [] Varicose veins   [] Non-healing ulcers Pulmonary:   [] Uses home oxygen   [] Productive cough   [] Hemoptysis   [] Wheeze  [] COPD    [] Asthma Neurologic:  [] Dizziness   [] Seizures   [] History of stroke   [] History of TIA  [] Aphasia   [] Vissual changes   [] Weakness or numbness in arm   [] Weakness or numbness in leg Musculoskeletal:   [] Joint swelling   [x] Joint pain   [] Low back pain Hematologic:  [] Easy bruising  [] Easy bleeding   [] Hypercoagulable state   [] Anemic Gastrointestinal:  [] Diarrhea   [] Vomiting  [] Gastroesophageal reflux/heartburn   [] Difficulty swallowing. Genitourinary:  [] Chronic kidney disease   [] Difficult urination  [] Frequent urination   [] Blood in urine Skin:  [x] Rashes   [] Ulcers  Psychological:  [] History of anxiety   []  History of major depression.  Physical Examination  There were no vitals filed for this visit. There is no height or weight on file to calculate BMI. Gen: WD/WN, NAD Head: [place]/AT, No temporalis wasting.  Ear/Nose/Throat: Hearing grossly intact, nares w/o erythema or drainage, poor dentition Eyes:  PER, EOMI, sclera nonicteric.  Neck: Supple, no masses.  No bruit or JVD.  Pulmonary:  Good air movement, clear to auscultation bilaterally, no use of accessory muscles.  Cardiac: RRR, normal S1, S2, no Murmurs. Vascular: scattered varicosities present bilaterally.  Mild venous stasis changes to the legs bilaterally.  3-4+ hard nonpitting edema Vessel Right Left  Radial Palpable Palpable  PT Not Palpable Not Palpable  DP Not Palpable Not Palpable  Gastrointestinal: soft, non-distended. No guarding/no peritoneal signs.  Musculoskeletal: M/S 5/5 throughout.  No deformity or atrophy.  Neurologic: CN 2-12 intact. Pain and light touch intact in extremities.  Symmetrical.  Speech is fluent. Motor exam as listed above. Psychiatric: Judgment intact, Mood & affect appropriate for pt's clinical situation. Dermatologic: severe venous rashes no ulcers noted.  No changes consistent with cellulitis. Lymph : No Cervical lymphadenopathy, no lichenification or skin changes of chronic  lymphedema.  CBC Lab Results  Component Value Date   WBC 9.6 03/05/2018   HGB 11.5 (L) 03/05/2018   HCT 36.4 03/05/2018   MCV 95.5 03/05/2018   PLT 168 03/05/2018    BMET    Component Value Date/Time   NA 138 04/11/2018 0941   NA 143 03/13/2018 1037   NA 139 06/16/2014 1240   K 4.9 04/11/2018 0941   K 3.2 (L) 06/16/2014 1240   CL 99 04/11/2018 0941   CL 104 06/16/2014 1240   CO2 28 04/11/2018 0941   CO2 27 06/16/2014 1240   GLUCOSE 120 (H) 04/11/2018 0941   GLUCOSE 216 (H) 06/16/2014 1240   BUN 77 (H) 04/11/2018 0941   BUN 37 (H) 03/13/2018 1037   BUN 19 06/16/2014 1240   CREATININE 2.54 (H) 04/11/2018 0941   CREATININE 1.10 (H) 06/16/2014 1240   CALCIUM 9.4 04/11/2018 0941   CALCIUM 8.9 06/16/2014 1240   GFRNONAA 34 (L) 03/13/2018 1037   GFRNONAA 45 (L) 06/16/2014 1240   GFRAA 39 (L) 03/13/2018 1037   GFRAA 52 (L) 06/16/2014 1240   Estimated Creatinine Clearance: 13.6 mL/min (A) (by C-G formula based on SCr of 2.54 mg/dL (H)).  COAG Lab Results  Component Value Date   INR 1.23 11/15/2016   INR 1.10 11/14/2016   INR 1.04 10/26/2016    Radiology Dg Knee Complete 4 Views Left  Result Date: 04/11/2018 CLINICAL DATA:  Left knee pain and weakness.  No known injury. EXAM: LEFT KNEE - COMPLETE 4+ VIEW COMPARISON:  None. FINDINGS: No fracture or dislocation is identified. Mild-to-moderate osteoarthritis about the knee appears worst in the lateral compartment. Chondrocalcinosis is noted. Small joint effusion is seen. Atherosclerosis is identified. IMPRESSION: No acute abnormality. Mild to moderate osteoarthritis appears worst in the lateral compartment. Chondrocalcinosis. Atherosclerosis. Electronically Signed   By: Inge Rise M.D.   On: 04/11/2018 15:02     Assessment/Plan 1. PAD (peripheral artery disease) (HCC)  Recommend:  The patient has evidence of atherosclerosis of the lower extremities with claudication.  The patient does not voice lifestyle limiting  changes at this point in time.  STAT ABI's show near normal perfusion bilaterally  No invasive studies, angiography or surgery at this time  The patient should continue walking and begin a more formal exercise program.  The patient should continue antiplatelet therapy and aggressive treatment of the lipid abnormalities  No changes in the patient's medications at this time  The patient should continue wearing graduated compression socks 10-15 mmHg strength to control the mild edema.   2. Lymphedema I believe a lymph pump is  indicated.  I have had a long discussion with the patient regarding swelling and why it  causes symptoms.  Patient will begin wearing graduated compression stockings class 1 (20-30 mmHg) on a daily basis a prescription was given. The patient will  beginning wearing the stockings first thing in the morning and removing them in the evening. The patient is instructed specifically not to sleep in the stockings.   In addition, behavioral modification will be initiated.  This will include frequent elevation, use of over the counter pain medications and exercise such as walking.  I have reviewed systemic causes for chronic edema such as liver, kidney and cardiac etiologies.  The patient denies problems with these organ systems.    I believe a lymph pump is indicated.  This would help to improve the edema control and prevent sequela such as ulcers and infections   Patient should undergo duplex ultrasound of the venous system to ensure that DVT or reflux is not present.  The patient will follow-up with me after the ultrasound.   - VAS [place] LOWER EXTREMITY VENOUS (DVT); Future  3. Bilateral carotid artery stenosis Recommend:  Given the patient's asymptomatic subcritical stenosis no further invasive testing or surgery at this time.  Continue antiplatelet therapy as prescribed Continue management of CAD, HTN and Hyperlipidemia Healthy heart diet,  encouraged exercise at  least 4 times per week   4. Essential hypertension Continue antihypertensive medications as already ordered, these medications have been reviewed and there are no changes at this time.   5. PSVT (paroxysmal supraventricular tachycardia) (HCC) Continue antiarrhythmia medications as already ordered, these medications have been reviewed and there are no changes at this time.  Continue anticoagulation as ordered by Cardiology Service   6. Type 2 diabetes mellitus with stage 4 chronic kidney disease, without long-term current use of insulin (HCC) Continue hypoglycemic medications as already ordered, these medications have been reviewed and there are no changes at this time.  Hgb A1C to be monitored as already arranged by primary service   7. Pure hypercholesterolemia Continue statin as ordered and reviewed, no changes at this time     Hortencia Pilar, MD  04/29/2018 3:53 PM

## 2018-04-30 ENCOUNTER — Other Ambulatory Visit: Payer: Self-pay

## 2018-04-30 ENCOUNTER — Ambulatory Visit (INDEPENDENT_AMBULATORY_CARE_PROVIDER_SITE_OTHER): Payer: PPO | Admitting: Vascular Surgery

## 2018-04-30 ENCOUNTER — Ambulatory Visit (INDEPENDENT_AMBULATORY_CARE_PROVIDER_SITE_OTHER): Payer: PPO

## 2018-04-30 ENCOUNTER — Encounter (INDEPENDENT_AMBULATORY_CARE_PROVIDER_SITE_OTHER): Payer: Self-pay | Admitting: Vascular Surgery

## 2018-04-30 ENCOUNTER — Other Ambulatory Visit (INDEPENDENT_AMBULATORY_CARE_PROVIDER_SITE_OTHER): Payer: Self-pay | Admitting: Vascular Surgery

## 2018-04-30 VITALS — BP 154/80 | HR 77 | Resp 14 | Ht 60.0 in | Wt 180.0 lb

## 2018-04-30 DIAGNOSIS — I700 Atherosclerosis of aorta: Secondary | ICD-10-CM

## 2018-04-30 DIAGNOSIS — I739 Peripheral vascular disease, unspecified: Secondary | ICD-10-CM

## 2018-04-30 DIAGNOSIS — E1151 Type 2 diabetes mellitus with diabetic peripheral angiopathy without gangrene: Secondary | ICD-10-CM | POA: Diagnosis not present

## 2018-04-30 DIAGNOSIS — I10 Essential (primary) hypertension: Secondary | ICD-10-CM | POA: Diagnosis not present

## 2018-04-30 DIAGNOSIS — M79606 Pain in leg, unspecified: Secondary | ICD-10-CM

## 2018-04-30 DIAGNOSIS — I129 Hypertensive chronic kidney disease with stage 1 through stage 4 chronic kidney disease, or unspecified chronic kidney disease: Secondary | ICD-10-CM

## 2018-04-30 DIAGNOSIS — E1122 Type 2 diabetes mellitus with diabetic chronic kidney disease: Secondary | ICD-10-CM

## 2018-04-30 DIAGNOSIS — I471 Supraventricular tachycardia: Secondary | ICD-10-CM

## 2018-04-30 DIAGNOSIS — E7800 Pure hypercholesterolemia, unspecified: Secondary | ICD-10-CM

## 2018-04-30 DIAGNOSIS — N184 Chronic kidney disease, stage 4 (severe): Secondary | ICD-10-CM

## 2018-04-30 DIAGNOSIS — I6523 Occlusion and stenosis of bilateral carotid arteries: Secondary | ICD-10-CM

## 2018-04-30 DIAGNOSIS — I890 Lymphedema, not elsewhere classified: Secondary | ICD-10-CM

## 2018-05-08 ENCOUNTER — Other Ambulatory Visit: Payer: Self-pay

## 2018-05-08 ENCOUNTER — Ambulatory Visit
Admission: RE | Admit: 2018-05-08 | Discharge: 2018-05-08 | Disposition: A | Discharge status: Home / Self Care | Payer: PPO | Source: Ambulatory Visit | Attending: Internal Medicine | Admitting: Internal Medicine

## 2018-05-08 DIAGNOSIS — R1030 Lower abdominal pain, unspecified: Secondary | ICD-10-CM | POA: Insufficient documentation

## 2018-05-08 DIAGNOSIS — K5730 Diverticulosis of large intestine without perforation or abscess without bleeding: Secondary | ICD-10-CM | POA: Diagnosis not present

## 2018-05-08 DIAGNOSIS — K449 Diaphragmatic hernia without obstruction or gangrene: Secondary | ICD-10-CM | POA: Diagnosis not present

## 2018-05-08 DIAGNOSIS — N939 Abnormal uterine and vaginal bleeding, unspecified: Secondary | ICD-10-CM | POA: Diagnosis not present

## 2018-05-08 DIAGNOSIS — N814 Uterovaginal prolapse, unspecified: Secondary | ICD-10-CM | POA: Diagnosis not present

## 2018-05-08 DIAGNOSIS — K5790 Diverticulosis of intestine, part unspecified, without perforation or abscess without bleeding: Secondary | ICD-10-CM | POA: Diagnosis not present

## 2018-05-14 ENCOUNTER — Telehealth: Payer: Self-pay | Admitting: Internal Medicine

## 2018-05-14 NOTE — Telephone Encounter (Signed)
Copied from [place] [phone]. Topic: Quick Communication - See Telephone Encounter >> May 14, 2018  6:33 PM Blase Mess A wrote: CRM for notification. See Telephone encounter for: 05/14/18.  Patient daughter Percell Locus (Daughter) [phone] is calling back for lab results. Please advise. Thank you.

## 2018-05-15 ENCOUNTER — Telehealth: Payer: Self-pay | Admitting: Internal Medicine

## 2018-05-15 NOTE — Telephone Encounter (Signed)
Noted  TMS 

## 2018-05-15 NOTE — Telephone Encounter (Signed)
Pt's daughter called in, [name] and was given mother's lab results from Dr. Aundra Dubin dated 04/11/2018 at 4:54 PM & 04/12/2018 at 12:09 PM.  Per [name] they have an appt set up with the kidney doctor.    They are only giving her 20 mg of Lasix a day due to her being unstable when she walks due to knee and urinary urgency.   It creates hardship per daughter  To give mother 40 mg a day.   Hard to get to the toilet on time.  These notes sent to Dr. Claris Gladden office.

## 2018-05-16 ENCOUNTER — Telehealth: Payer: Self-pay

## 2018-05-16 NOTE — Telephone Encounter (Signed)
Mailed mychart results to pt regarding CT

## 2018-05-21 ENCOUNTER — Ambulatory Visit
Admission: RE | Admit: 2018-05-21 | Discharge: 2018-05-21 | Disposition: A | Discharge status: Home / Self Care | Payer: PPO | Source: Ambulatory Visit | Attending: Family | Admitting: Family

## 2018-05-21 ENCOUNTER — Ambulatory Visit: Payer: PPO | Admitting: Podiatry

## 2018-05-21 ENCOUNTER — Encounter: Payer: Self-pay | Admitting: Family

## 2018-05-21 ENCOUNTER — Ambulatory Visit (INDEPENDENT_AMBULATORY_CARE_PROVIDER_SITE_OTHER): Payer: PPO | Admitting: Family

## 2018-05-21 ENCOUNTER — Other Ambulatory Visit: Payer: Self-pay

## 2018-05-21 ENCOUNTER — Encounter: Payer: Self-pay | Admitting: Podiatry

## 2018-05-21 ENCOUNTER — Telehealth: Payer: Self-pay | Admitting: Cardiovascular Disease

## 2018-05-21 VITALS — BP 142/74 | HR 75 | Temp 98.1°F | Wt 186.8 lb

## 2018-05-21 DIAGNOSIS — R600 Localized edema: Secondary | ICD-10-CM

## 2018-05-21 DIAGNOSIS — I509 Heart failure, unspecified: Secondary | ICD-10-CM | POA: Diagnosis not present

## 2018-05-21 DIAGNOSIS — D689 Coagulation defect, unspecified: Secondary | ICD-10-CM | POA: Diagnosis not present

## 2018-05-21 DIAGNOSIS — E1159 Type 2 diabetes mellitus with other circulatory complications: Secondary | ICD-10-CM | POA: Diagnosis not present

## 2018-05-21 DIAGNOSIS — M79676 Pain in unspecified toe(s): Secondary | ICD-10-CM | POA: Diagnosis not present

## 2018-05-21 DIAGNOSIS — B351 Tinea unguium: Secondary | ICD-10-CM

## 2018-05-21 DIAGNOSIS — R0602 Shortness of breath: Secondary | ICD-10-CM | POA: Diagnosis not present

## 2018-05-21 MED ORDER — MUPIROCIN 2 % EX OINT: 1.0000 "application " | TOPICAL_OINTMENT | Freq: Two times a day (BID) | CUTANEOUS | 0 refills | Status: DC

## 2018-05-21 NOTE — Patient Instructions (Addendum)
Very short burst of increased demadex; however I would like you to come back and repeat your labs to ensure electrolytes, kidney function remained stable.  Labs on Friday.   Please take 1 and 1/2 tablet of Demadex  ( 30mg ) daily the next 2 to 3 days.  Then resume regular 1 ( 20mg ) tablet of Demadex.  We want to target lifestyle interventions.  Low-salt, elevation of legs is of utmost importance.   If swelling does not improve and certainly weight does not come down closer to baseline, please call the office and let me know.  Today we discussed referrals, orders.  Chest x-ray, ultrasound at [facility]   I have placed these orders in the system for you.  Please be sure to give [place] a call if you have not heard from our office regarding this. We should hear from [place] within ONE week with information regarding your appointment. If not, please let me know immediately.      Peripheral Edema  Peripheral edema is swelling that is caused by a buildup of fluid. Peripheral edema most often affects the lower legs, ankles, and feet. It can also develop in the arms, hands, and face. The area of the body that has peripheral edema will look swollen. It may also feel heavy or warm. Your clothes may start to feel tight. Pressing on the area may make a temporary dent in your skin. You may not be able to move your arm or leg as much as usual. There are many causes of peripheral edema. It can be a complication of other diseases, such as congestive heart failure, kidney disease, or a problem with your blood circulation. It also can be a side effect of certain medicines. It often happens to women during pregnancy. Sometimes, the cause is not known. Treating the underlying condition is often the only treatment for peripheral edema. Follow these instructions at home: Pay attention to any changes in your symptoms. Take these actions to help with your discomfort:  Raise (elevate) your legs while you are sitting or lying  down.  Move around often to prevent stiffness and to lessen swelling. Do not sit or stand for long periods of time.  Wear support stockings as told by your health care provider.  Follow instructions from your health care provider about limiting salt (sodium) in your diet. Sometimes eating less salt can reduce swelling.  Take over-the-counter and prescription medicines only as told by your health care provider. Your health care provider may prescribe medicine to help your body get rid of excess water (diuretic).  Keep all follow-up visits as told by your health care provider. This is important. Contact a health care provider if:  You have a fever.  Your edema starts suddenly or is getting worse, especially if you are pregnant or have a medical condition.  You have swelling in only one leg.  You have increased swelling and pain in your legs. Get help right away if:  You develop shortness of breath, especially when you are lying down.  You have pain in your chest or abdomen.  You feel weak.  You faint. This information is not intended to replace advice given to you by your health care provider. Make sure you discuss any questions you have with your health care provider. Document Released: 03/24/2004 Document Revised: 07/20/2015 Document Reviewed: 08/27/2014 Elsevier Interactive Patient Education  2019 [employer].

## 2018-05-21 NOTE — Assessment & Plan Note (Addendum)
Acute on chronic.  Per daughter, appears that typically well controlled with Demadex.  Patient education provided in regards to how to elevate safely, effectively.  Advised that elevation is essential to treating chronic edema.  Advised with proper elevation she may find she is able to wear compression stockings more comfortably.  Reiterated low-salt diet.  She will continue to monitor weight, short burst of increased Demadex.  Topical bactroban for prevention of infection, no obvious infection seen today.  Counseled daughter on signs, symptoms to look for; she verbalized understanding of all

## 2018-05-21 NOTE — Progress Notes (Signed)
Subjective:    Patient ID: [name], female    DOB: Aug 31, 1924, 85 y.o.   MRN: [id number]  CC: [name] is a 85 y.o. female who presents today for an acute visit.    HPI: Here today for primary concern regards to discoloration of bilateral lower extremities and also scabs noted over the past month, unchanged.  No history of cellulitis, MRSA.  Marland KitchenSwelling is more or less at baseline per daughter who accompanies her today.  Daughter is primary historian.    She feels this is usually managed with diuretic therapy.  In the past has taken a tablet in half or 2 tablets of demadex with resolve of swelling.  Normally wears compression stockings however become uncomfortable; wearing ace wrap today.   No fever, warmth of extremities, purulent discharge from breaks in skin, arthralgias.  Otherwise, daughter states mother feels well, with no other complaints   Per daughter, doesn't like to sleep supine. Feet 'dangle' in recliner when sleeps. Not elevating legs. SOB when walks down hallway at home, this has been going on for 6 months, unchanged. NO cough.  Weight from 174 to 186 over past 2 weeks.   H/o DM , CKD,CHF, HTN  Pending appt with Nephrology in April.   Follows with podiatry.   Cardiogram January of this year.  Ejection fraction 50 to 55%.  Follows with Dr. Rockey Situ. Also following with HF failure clinic per daughter.    HISTORY:  Past Medical History:  Diagnosis Date   Anemia    Carotid arterial disease ([place])    a. 08/2014 U/S: Bilateral < 50% stenosis. Patent vertebrals w/ antegrade flow.    Cervical spine arthritis    Chronic diastolic CHF (congestive heart failure) ([place])    a. echo 2014: EF 55-60%, DD;  b. 08/2014 Echo: EF 55-60%, no RWMA, GR1DD; c. 02/2016 Echo: EF 65-70%, Gr1 DD.   Cognitive impairment    Depression    Diabetes mellitus without complication ([place])    Essential hypertension    GERD (gastroesophageal reflux disease)     Glaucoma    Gout    History of renal impairment    Hyperlipidemia    Hypotension    a. Related to Norvasc - 11/2014 ED visit.   Multinodular goiter    a. Noted incidentally on CT 07/2012 and carotid U/S 08/2014;  b. Nl TSH 11/2014.   Paroxysmal SVT (supraventricular tachycardia) ([facility])    a. on Toprol    Prolapsed uterus    Severe aortic stenosis    a. 11/15/16: s/p TAVR Edwards Sapien 3 THV (size 23 mm, model # 9600TFX, serial # [id number])   Stroke ([place])    a. CT head 07/2014 showed small thalamic infarct   Vitamin deficiency    Past Surgical History:  Procedure Laterality Date   EYE SURGERY     unsure of what procedure, did know that laser was used   IR RADIOLOGY PERIPHERAL GUIDED IV START  11/03/2016   IR US GUIDE VASC ACCESS RIGHT  11/03/2016   RIGHT/LEFT HEART CATH AND CORONARY ANGIOGRAPHY N/A 10/26/2016   Procedure: RIGHT/LEFT HEART CATH AND CORONARY ANGIOGRAPHY;  Surgeon: [name], MD;  Location: [facility];  Service: Cardiovascular;  Laterality: N/A;   TEE WITHOUT CARDIOVERSION N/A 03/31/2016   Procedure: TRANSESOPHAGEAL ECHOCARDIOGRAM (TEE);  Surgeon: Minna Merritts, MD;  Location: [facility];  Service: Cardiovascular;  Laterality: N/A;   TEE WITHOUT CARDIOVERSION N/A 11/15/2016   Procedure: TRANSESOPHAGEAL ECHOCARDIOGRAM (  TEE);  Surgeon: [name], MD;  Location: [facility];  Service: Open Heart Surgery;  Laterality: N/A;   TRANSCATHETER AORTIC VALVE REPLACEMENT, TRANSFEMORAL N/A 11/15/2016   Procedure: TRANSCATHETER AORTIC VALVE REPLACEMENT, TRANSFEMORAL;  Surgeon: [name], MD;  Location: [facility];  Service: Open Heart Surgery;  Laterality: N/A;   TUBAL LIGATION     Family History  Problem Relation Age of Onset   Glaucoma Mother    Diabetes Mother    Peptic Ulcer Father    Colitis Father     Allergies: Metformin hcl; Metformin; and Metformin and related Current Outpatient Medications on File Prior to Visit  Medication Sig Dispense  Refill   ACCU-CHEK AVIVA PLUS test strip USE 1 STRIP TO TEST BLOOD SUGARS 3 TIMES A DAY 300 each 4   acetaminophen (TYLENOL) 500 MG tablet Take 1 tablet (500 mg total) by mouth every 6 (six) hours as needed for mild pain or headache. 360 tablet 3   allopurinol (ZYLOPRIM) 100 MG tablet Take 1 tablet (100 mg total) by mouth daily. 90 tablet 3   Amino Acids-Protein Hydrolys (FEEDING SUPPLEMENT, PRO-STAT SUGAR FREE 64,) LIQD Take 30 mLs by mouth 3 (three) times daily with meals. Takes as needed     aspirin 81 MG chewable tablet Chew 1 tablet (81 mg total) by mouth daily. 90 tablet 3   blood glucose meter kit and supplies Dispense based on patient and insurance preference. Use 1x daily as directed. (FOR ICD-10 E10.9, E11.9). 1 each 0   brimonidine (ALPHAGAN P) 0.1 % SOLN Place 1 drop into the right eye 2 (two) times daily.     Carboxymethylcellulose Sodium (ARTIFICIAL TEARS OP) Apply 1 drop to eye 2 (two) times daily. Left eye     cholecalciferol (VITAMIN D) 25 MCG (1000 UT) tablet Take 1 tablet (1,000 Units total) by mouth daily. (Patient taking differently: Take 2,000 Units by mouth daily. ) 90 tablet 3   clotrimazole (LOTRIMIN) 1 % cream APPLY TOPICALLY TO THE AFFECTED AREA ONCE A DAY AT BEDTIME FOR 7-14 DAYS 60 g 11   dimethicone (PROSHIELD PLUS SKIN PROTECTANT) 1 % cream 1 application to the buttock 2 times daily to help protect skin 114 g 11   Dorzolamide HCl-Timolol Mal (COSOPT OP) Place 1 drop into the right eye 2 (two) times daily.      feeding supplement, ENSURE ENLIVE, (ENSURE ENLIVE) LIQD Take 237 mLs by mouth 2 (two) times daily between meals. 237 mL 12   fluticasone (FLONASE) 50 MCG/ACT nasal spray Place 1 spray into both nostrils daily. Prn max 2 sprays total each nose 16 g 11   gabapentin (NEURONTIN) 300 MG capsule Take 1 capsule (300 mg total) by mouth 2 (two) times daily. 180 capsule 3   glucose blood test strip Accu-Chek Aviva Plus test strips 300 each 4   hydrALAZINE  (APRESOLINE) 10 MG tablet Take 0.5 tablets (5 mg total) by mouth 3 (three) times daily. Please hold the hydralazine for systolic pressure <379 And for Diastolic pressure <02 90 tablet 3   HYDROcodone-acetaminophen (NORCO/VICODIN) 5-325 MG tablet Take 1 tablet by mouth every 6 (six) hours as needed for moderate pain. 30 tablet 0   Infant Care Products (DERMACLOUD) CREA Apply 1 application topically 2 (two) times daily as needed. 430 g 11   iron polysaccharides (NIFEREX) 150 MG capsule Take 1 capsule (150 mg total) by mouth daily. Note reduction in frequency from 2x to 1 x per day 90 capsule 3   latanoprost (XALATAN) 0.005 %  ophthalmic solution Place 1 drop into the right eye at bedtime.      lidocaine (LIDODERM) 5 % Place onto the skin.     LORazepam (ATIVAN) 0.5 MG tablet Take 1 tablet (0.5 mg total) by mouth 2 (two) times daily as needed. 60 tablet 5   lovastatin (MEVACOR) 40 MG tablet Take 1 tablet (40 mg total) by mouth at bedtime. 90 tablet 3   metoprolol succinate (TOPROL-XL) 25 MG 24 hr tablet Take 1 tablet (25 mg total) by mouth daily. 90 tablet 3   Multiple Vitamin (MULTI-VITAMIN PO) Take 1 tablet by mouth daily.     Nutritional Supplements (BOOST GLUCOSE CONTROL PO) Take 1 Container by mouth daily.     ONETOUCH DELICA LANCETS 51O MISC USE TO TEST BLOOD SUGAR ONCE A DAY  0   pantoprazole (PROTONIX) 40 MG tablet Take 1 tablet (40 mg total) by mouth daily. 30 minutes before breakfast OR dinner 90 tablet 3   pilocarpine (PILOCAR) 4 % ophthalmic solution Place 1 drop into the right eye 4 (four) times daily.      polyethylene glycol (MIRALAX) packet Take 17 g by mouth daily. prn 30 each 11   potassium chloride (K-DUR) 10 MEQ tablet TAKE 1 TABLET BY MOUTH EVERY DAY TAKE WITH LASIX     potassium chloride (K-DUR,KLOR-CON) 10 MEQ tablet Take 10 mEq by mouth daily. With fluid pill     senna-docusate (SENOKOT-S) 8.6-50 MG tablet Take 2 tablets by mouth daily as needed for mild  constipation.      sertraline (ZOLOFT) 50 MG tablet Take 1 tablet (50 mg total) by mouth daily. 90 tablet 3   sitaGLIPtin (JANUVIA) 50 MG tablet Take 1 tablet (50 mg total) by mouth daily. 90 tablet 3   torsemide (DEMADEX) 20 MG tablet Take 1 tablet (20 mg total) by mouth daily. 90 tablet 3   traZODone (DESYREL) 100 MG tablet Take 0.5-1 tablets (50-100 mg total) by mouth at bedtime as needed. 90 tablet 3   vitamin C (ASCORBIC ACID) 500 MG tablet Take 1 tablet (500 mg total) by mouth daily. 90 tablet 3   Zinc Oxide 10 % OINT Apply 1 application topically daily as needed (rash). Applied to patient's bottom 226.8 g 11   No current facility-administered medications on file prior to visit.     Social History   Tobacco Use   Smoking status: Never Smoker   Smokeless tobacco: Never Used  Substance Use Topics   Alcohol use: No   Drug use: No    Review of Systems  Constitutional: Negative for chills and fever.  Respiratory: Positive for shortness of breath. Negative for cough and wheezing.   Cardiovascular: Positive for leg swelling. Negative for chest pain and palpitations.  Gastrointestinal: Negative for nausea and vomiting.  Neurological: Negative for dizziness.      Objective:    BP (!) 142/74 (BP Location: Left Arm, Patient Position: Sitting, Cuff Size: Large)    Pulse 75    Temp 98.1 F (36.7 C)    Wt 186 lb 12.8 oz (84.7 kg)    SpO2 97%    BMI 36.48 kg/m  Wt Readings from Last 3 Encounters:  05/21/18 186 lb 12.8 oz (84.7 kg)  04/30/18 180 lb (81.6 kg)  04/16/18 179 lb (81.2 kg)    Physical Exam Vitals signs reviewed.  Constitutional:      Appearance: She is well-developed.  Eyes:     Conjunctiva/sclera: Conjunctivae normal.  Cardiovascular:     Rate  and Rhythm: Normal rate and regular rhythm.     Pulses: Normal pulses.     Heart sounds: Normal heart sounds.     Comments: No palpable cords or masses BLE. No erythema or increased warmth. No asymmetry in calf  size when compared bilaterally Hyperpigmentation noted BLE. No varicosities noted.  LE warm and palpable pedal pulses.  Pulmonary:     Effort: Pulmonary effort is normal.     Breath sounds: Normal breath sounds. No wheezing, rhonchi or rales.  Musculoskeletal:     Right lower leg: 1+ Pitting Edema present.     Left lower leg: 1+ Pitting Edema present.  Skin:    General: Skin is warm and dry.     Findings: Wound present. No erythema or rash.          Comments: Punctuate intact scabs noted as marked on diagram.  No surrounding erythema, increased warmth or streaking appreciated on exam.  Neurological:     Mental Status: She is alert.  Psychiatric:        Speech: Speech normal.        Behavior: Behavior normal.        Thought Content: Thought content normal.        Assessment & Plan:   Problem List Items Addressed This Visit      Cardiovascular and Mediastinum   CHF (congestive heart failure) ([place])    Patient is well-appearing, no acute respiratory distress.  Does appear to have orthopnea ( sleeping in recliner).  No adventitious lung sounds today.  She has notable weight gain, 6 pounds in last 3 weeks.  Pending chest x-ray.  Advised short increased burst of Demadex with repeat BMP.  Patient and daughter understand to let us know how she is doing particularly in setting of CHF        Other   Lower extremity edema - Primary    Acute on chronic.  Per daughter, appears that typically well controlled with Demadex.  Patient education provided in regards to how to elevate safely, effectively.  Advised that elevation is essential to treating chronic edema.  Advised with proper elevation she may find she is able to wear compression stockings more comfortably.  Reiterated low-salt diet.  She will continue to monitor weight, short burst of increased Demadex.  Topical bactroban for prevention of infection, no obvious infection seen today.  Counseled daughter on signs, symptoms to look for;  she verbalized understanding of all      Relevant Medications   mupirocin ointment (BACTROBAN) 2 %   Other Relevant Orders   US Venous Img Lower Bilateral   DG Chest 2 View   Basic metabolic panel        I am having [name] start on mupirocin ointment. I am also having her maintain her pilocarpine, brimonidine, Dorzolamide HCl-Timolol Mal (COSOPT OP), feeding supplement (ENSURE ENLIVE), HYDROcodone-acetaminophen, feeding supplement (PRO-STAT SUGAR FREE 64), senna-docusate, Multiple Vitamin (MULTI-VITAMIN PO), lidocaine, latanoprost, hydrALAZINE, blood glucose meter kit and supplies, OneTouch Delica Lancets 63K, torsemide, acetaminophen, allopurinol, aspirin, cholecalciferol, clotrimazole, dimethicone, fluticasone, gabapentin, glucose blood, Accu-Chek Aviva Plus, Dermacloud, iron polysaccharides, LORazepam, lovastatin, metoprolol succinate, pantoprazole, polyethylene glycol, sertraline, sitaGLIPtin, traZODone, vitamin C, Zinc Oxide, Nutritional Supplements (BOOST GLUCOSE CONTROL PO), potassium chloride, Carboxymethylcellulose Sodium (ARTIFICIAL TEARS OP), and potassium chloride.   Meds ordered this encounter  Medications   mupirocin ointment (BACTROBAN) 2 %    Sig: Place 1 application into the nose 2 (two) times daily.    Dispense:  22 g    Refill:  0    Order Specific Question:   Supervising Provider    Answer:   Crecencio Mc [[id number]]    Return precautions given.   Risks, benefits, and alternatives of the medications and treatment plan prescribed today were discussed, and patient expressed understanding.   Education regarding symptom management and diagnosis given to patient on AVS.  Continue to follow with McLean-Scocuzza, Nino Glow, MD for routine health maintenance.   [place] and I agreed with plan.   Mable Paris, FNP

## 2018-05-21 NOTE — Telephone Encounter (Signed)
Pt c/o swelling: STAT is pt has developed SOB within 24 hours  1) How much weight have you gained and in what time span? 6 lbs this week   2) If swelling, where is the swelling located? BLE up to knee   3) Are you currently taking a fluid pill? Yes   4) Are you currently SOB?  Yes sleeping in recliner as exertion and laying flat makes her tired starting the last month or 2   5) Do you have a log of your daily weights (if so, list)? Yes has with her now but driving currently        6) Have you gained 3 pounds in a day or 5 pounds in a week? Yes   7) Have you traveled recently? No

## 2018-05-21 NOTE — Progress Notes (Signed)
Complaint:  Visit Type: Patient returns to my office for continued preventative foot care services. Complaint: Patient states" my nails have grown long and thick and become painful to walk and wear shoes" Patient has been diagnosed with DM with no foot complications. The patient presents for preventative foot care services. No changes to ROS.  Patient is taking plavix.   Podiatric Exam: Vascular: dorsalis pedis  pulses are weakly palpable bilateral. PT pulses are not palpable  B/L. Capillary return is immediate. Temperature gradient is WNL. Skin turgor WNL  Sensorium: Normal Semmes Weinstein monofilament test. Normal tactile sensation bilaterally. Nail Exam: Pt has thick disfigured discolored nails with subungual debris noted bilateral entire nail hallux through fifth toenails Ulcer Exam: There is no evidence of ulcer or pre-ulcerative changes or infection. Orthopedic Exam: Muscle tone and strength are WNL. No limitations in general ROM. No crepitus or effusions noted. Foot type and digits show no abnormalities. HAV  B/L. Skin: No Porokeratosis. No infection or ulcers  Diagnosis:  Onychomycosis, , Pain in right toe, pain in left toes,  Diabetes with vascular disease. HAV  B/L.  Treatment & Plan Procedures and Treatment: Consent by patient was obtained for treatment procedures.   Debridement of mycotic and hypertrophic toenails, 1 through 5 bilateral and clearing of subungual debris. No ulceration, no infection noted. Return Visit-Office Procedure: Patient instructed to return to the office for a follow up visit 3 months for continued evaluation and treatment.    Gardiner Barefoot DPM

## 2018-05-21 NOTE — Assessment & Plan Note (Signed)
Patient is well-appearing, no acute respiratory distress.  Does appear to have orthopnea ( sleeping in recliner).  No adventitious lung sounds today.  She has notable weight gain, 6 pounds in last 3 weeks.  Pending chest x-ray.  Advised short increased burst of Demadex with repeat BMP.  Patient and daughter understand to let us know how she is doing particularly in setting of CHF

## 2018-05-21 NOTE — Telephone Encounter (Signed)
Returned the call to the patient's daughter. She stated that the patient has gained 6 pounds in a week. She is currently taking Torsemide 20 mg daily.  The daughter stated that the patient went to see her PCP today about the weight gain. They have increased the Torsemide to 30 mg daily for a few days. The daughter was driving and unable to tell the exact directions. The patient also had a chest x ray and venous doppler.   The daughter will call back if the weight doesn't decrease or the patient develops increased shortness of breath.

## 2018-05-25 ENCOUNTER — Other Ambulatory Visit (INDEPENDENT_AMBULATORY_CARE_PROVIDER_SITE_OTHER): Payer: PPO

## 2018-05-25 ENCOUNTER — Other Ambulatory Visit: Payer: Self-pay

## 2018-05-25 DIAGNOSIS — R600 Localized edema: Secondary | ICD-10-CM | POA: Diagnosis not present

## 2018-05-25 LAB — BASIC METABOLIC PANEL
BUN: 22 mg/dL (ref 6–23)
CO2: 31 mEq/L (ref 19–32)
Calcium: 9.5 mg/dL (ref 8.4–10.5)
Chloride: 99 mEq/L (ref 96–112)
Creatinine, Ser: 1.26 mg/dL — ABNORMAL HIGH (ref 0.40–1.20)
GFR: 48.1 mL/min — AB (ref >60.00)
Glucose, Bld: 138 mg/dL — ABNORMAL HIGH (ref 70–99)
Potassium: 4.3 mEq/L (ref 3.5–5.1)
Sodium: 139 mEq/L (ref 135–145)

## 2018-05-29 ENCOUNTER — Telehealth: Payer: Self-pay

## 2018-05-29 ENCOUNTER — Encounter: Payer: Self-pay | Admitting: Family

## 2018-05-29 ENCOUNTER — Other Ambulatory Visit: Payer: Self-pay

## 2018-05-29 ENCOUNTER — Ambulatory Visit: Payer: PPO | Attending: Family | Admitting: Family

## 2018-05-29 VITALS — BP 127/75 | HR 70 | Wt 182.2 lb

## 2018-05-29 DIAGNOSIS — I890 Lymphedema, not elsewhere classified: Secondary | ICD-10-CM

## 2018-05-29 DIAGNOSIS — I10 Essential (primary) hypertension: Secondary | ICD-10-CM

## 2018-05-29 DIAGNOSIS — I5032 Chronic diastolic (congestive) heart failure: Secondary | ICD-10-CM

## 2018-05-29 NOTE — Patient Instructions (Signed)
Continue weighing daily and call for an overnight weight gain of > 2 pounds or a weekly weight gain of >5 pounds.  Referral is being made for lymphapress compression boots.

## 2018-05-29 NOTE — Progress Notes (Signed)
Virtual Visit via Telephone Note     Evaluation Performed:  Follow-up visit  This visit type was conducted due to national recommendations for restrictions regarding the COVID-19 Pandemic (e.g. social distancing).  This format is felt to be most appropriate for this patient at this time.  All issues noted in this document were discussed and addressed.  No physical exam was performed (except for noted visual exam findings with Video Visits).  Please refer to the patient's chart (MyChart message for video visits and phone note for telephone visits) for the patient's consent to telehealth for [facility]  Date:  05/29/2018   ID:  [name], DOB [birth year], MRN [id number]  Patient Location:  [place] [address]   Provider location:   [facility] [place] [address], [place] [address]  PCP:  McLean-Scocuzza, Nino Glow, MD  Cardiologist:  Christell Faith, [place] Electrophysiologist:  None   Chief Complaint: moderate shortness of breath  History of Present Illness:    [name] is a 85 y.o. female who presents via audio/video conferencing for a telehealth visit today.    The patient does not have symptoms concerning for COVID-19 infection (fever, chills, cough, or new SHORTNESS OF BREATH).   Patient describes a moderate amount of shortness of breath upon minimal exertion. She says that this has been present for several years. She has associated fatigue, chronic difficulty sleeping, dry cough (present for ~ 1 year), swelling in her legs and fluctuating weight along with this. She denies any fever, sore throat, chest pain, palpitations, dizziness or swelling in her abdomen. She did get established with a nephrologist but can't recall his name. Has been wearing her compression socks but edema continues and they are now making her legs itch.   Prior CV studies:   The following studies were reviewed today:  Echo report from  03/09/2018 reviewed and showed EF of 50-55% along with elevated PA pressure of 45 mm Hg. Bioprosthesis was present and functioning normally.   Past Medical History:  Diagnosis Date  . Anemia   . Carotid arterial disease ([facility])    a. 08/2014 U/S: Bilateral < 50% stenosis. Patent vertebrals w/ antegrade flow.   . Cervical spine arthritis   . Chronic diastolic CHF (congestive heart failure) ([facility])    a. echo 2014: EF 55-60%, DD;  b. 08/2014 Echo: EF 55-60%, no RWMA, GR1DD; c. 02/2016 Echo: EF 65-70%, Gr1 DD.  Marland Kitchen Cognitive impairment   . Depression   . Diabetes mellitus without complication ([facility])   . Essential hypertension   . GERD (gastroesophageal reflux disease)   . Glaucoma   . Gout   . History of renal impairment   . Hyperlipidemia   . Hypotension    a. Related to Norvasc - 11/2014 ED visit.  . Multinodular goiter    a. Noted incidentally on CT 07/2012 and carotid U/S 08/2014;  b. Nl TSH 11/2014.  Marland Kitchen Paroxysmal SVT (supraventricular tachycardia) (HCC)    a. on Toprol   . Prolapsed uterus   . Severe aortic stenosis    a. 11/15/16: s/p TAVR Edwards Sapien 3 THV (size 23 mm, model # 9600TFX, serial # [id number])  . Stroke [facility])    a. CT head 07/2014 showed small thalamic infarct  . Vitamin deficiency    Past Surgical History:  Procedure Laterality Date  . EYE SURGERY     unsure of what procedure, did know that laser was  used  . IR RADIOLOGY PERIPHERAL GUIDED IV START  11/03/2016  . IR US GUIDE VASC ACCESS RIGHT  11/03/2016  . RIGHT/LEFT HEART CATH AND CORONARY ANGIOGRAPHY N/A 10/26/2016   Procedure: RIGHT/LEFT HEART CATH AND CORONARY ANGIOGRAPHY;  Surgeon: [name], MD;  Location: [facility];  Service: Cardiovascular;  Laterality: N/A;  . TEE WITHOUT CARDIOVERSION N/A 03/31/2016   Procedure: TRANSESOPHAGEAL ECHOCARDIOGRAM (TEE);  Surgeon: Minna Merritts, MD;  Location: [facility];  Service: Cardiovascular;  Laterality: N/A;  . TEE WITHOUT CARDIOVERSION N/A 11/15/2016   Procedure:  TRANSESOPHAGEAL ECHOCARDIOGRAM (TEE);  Surgeon: [name], MD;  Location: [facility];  Service: Open Heart Surgery;  Laterality: N/A;  . TRANSCATHETER AORTIC VALVE REPLACEMENT, TRANSFEMORAL N/A 11/15/2016   Procedure: TRANSCATHETER AORTIC VALVE REPLACEMENT, TRANSFEMORAL;  Surgeon: [name], MD;  Location: [facility];  Service: Open Heart Surgery;  Laterality: N/A;  . TUBAL LIGATION       Current Meds  Medication Sig  . ACCU-CHEK AVIVA PLUS test strip USE 1 STRIP TO TEST BLOOD SUGARS 3 TIMES A DAY  . acetaminophen (TYLENOL) 500 MG tablet Take 1 tablet (500 mg total) by mouth every 6 (six) hours as needed for mild pain or headache.  . allopurinol (ZYLOPRIM) 100 MG tablet Take 1 tablet (100 mg total) by mouth daily.  Marland Kitchen aspirin 81 MG chewable tablet Chew 1 tablet (81 mg total) by mouth daily.  . blood glucose meter kit and supplies Dispense based on patient and insurance preference. Use 1x daily as directed. (FOR ICD-10 E10.9, E11.9).  . brimonidine (ALPHAGAN P) 0.1 % SOLN Place 1 drop into the right eye 2 (two) times daily.  . Carboxymethylcellulose Sodium (ARTIFICIAL TEARS OP) Apply 1 drop to eye 2 (two) times daily. Left eye  . cholecalciferol (VITAMIN D) 25 MCG (1000 UT) tablet Take 1 tablet (1,000 Units total) by mouth daily.  . clotrimazole (LOTRIMIN) 1 % cream APPLY TOPICALLY TO THE AFFECTED AREA ONCE A DAY AT BEDTIME FOR 7-14 DAYS  . dimethicone (PROSHIELD PLUS SKIN PROTECTANT) 1 % cream 1 application to the buttock 2 times daily to help protect skin  . Dorzolamide HCl-Timolol Mal (COSOPT OP) Place 1 drop into the right eye 2 (two) times daily.   . feeding supplement, ENSURE ENLIVE, (ENSURE ENLIVE) LIQD Take 237 mLs by mouth 2 (two) times daily between meals.  . fluticasone (FLONASE) 50 MCG/ACT nasal spray Place 1 spray into both nostrils daily. Prn max 2 sprays total each nose  . gabapentin (NEURONTIN) 300 MG capsule Take 1 capsule (300 mg total) by mouth 2 (two) times daily.  Marland Kitchen glucose  blood test strip Accu-Chek Aviva Plus test strips  . iron polysaccharides (NIFEREX) 150 MG capsule Take 1 capsule (150 mg total) by mouth daily. Note reduction in frequency from 2x to 1 x per day (Patient taking differently: Take 150 mg by mouth daily. )  . latanoprost (XALATAN) 0.005 % ophthalmic solution Place 1 drop into the right eye at bedtime.   . lovastatin (MEVACOR) 40 MG tablet Take 1 tablet (40 mg total) by mouth at bedtime.  . metoprolol succinate (TOPROL-XL) 25 MG 24 hr tablet Take 1 tablet (25 mg total) by mouth daily.  . Multiple Vitamin (MULTI-VITAMIN PO) Take 1 tablet by mouth daily.  . mupirocin ointment (BACTROBAN) 2 % Place 1 application into the nose 2 (two) times daily.  . Nutritional Supplements (BOOST GLUCOSE CONTROL PO) Take 1 Container by mouth daily.  Glory Rosebush DELICA LANCETS 13K MISC USE  TO TEST BLOOD SUGAR ONCE A DAY  . pantoprazole (PROTONIX) 40 MG tablet Take 1 tablet (40 mg total) by mouth daily. 30 minutes before breakfast OR dinner  . pilocarpine (PILOCAR) 4 % ophthalmic solution Place 1 drop into the right eye 4 (four) times daily.   . polyethylene glycol (MIRALAX) packet Take 17 g by mouth daily. prn  . potassium chloride (K-DUR,KLOR-CON) 10 MEQ tablet Take 10 mEq by mouth daily. With fluid pill  . senna-docusate (SENOKOT-S) 8.6-50 MG tablet Take 2 tablets by mouth daily as needed for mild constipation.   . sertraline (ZOLOFT) 50 MG tablet Take 1 tablet (50 mg total) by mouth daily.  . sitaGLIPtin (JANUVIA) 50 MG tablet Take 1 tablet (50 mg total) by mouth daily.  Marland Kitchen torsemide (DEMADEX) 20 MG tablet Take 1 tablet (20 mg total) by mouth daily.  . traZODone (DESYREL) 100 MG tablet Take 0.5-1 tablets (50-100 mg total) by mouth at bedtime as needed.  . vitamin C (ASCORBIC ACID) 500 MG tablet Take 1 tablet (500 mg total) by mouth daily.  . Zinc Oxide 10 % OINT Apply 1 application topically daily as needed (rash). Applied to patient's bottom     Allergies:    Metformin hcl; Metformin; and Metformin and related   Social History   Tobacco Use  . Smoking status: Never Smoker  . Smokeless tobacco: Never Used  Substance Use Topics  . Alcohol use: No  . Drug use: No     Family Hx: The patient's family history includes Colitis in her father; Diabetes in her mother; Glaucoma in her mother; Peptic Ulcer in her father.  ROS:   Please see the history of present illness.     All other systems reviewed and are negative.   Labs/Other Tests and Data Reviewed:    Recent Labs: 01/04/2018: TSH 1.21 02/18/2018: B Natriuretic Peptide 68.0 03/05/2018: Hemoglobin 11.5; Platelets 168 04/11/2018: ALT 9; Magnesium 1.9 05/25/2018: BUN 22; Creatinine, Ser 1.26; Potassium 4.3; Sodium 139   Recent Lipid Panel Lab Results  Component Value Date/Time   CHOL 144 04/11/2018 09:41 AM   TRIG 119.0 04/11/2018 09:41 AM   HDL 41.50 04/11/2018 09:41 AM   CHOLHDL 3 04/11/2018 09:41 AM   LDLCALC 79 04/11/2018 09:41 AM    Wt Readings from Last 3 Encounters:  05/29/18 182 lb 4 oz (82.7 kg)  05/21/18 186 lb 12.8 oz (84.7 kg)  04/30/18 180 lb (81.6 kg)     Exam:    Vital Signs:  BP 127/75 Comment: self-reported  Pulse 70 Comment: self-reported  Wt 182 lb 4 oz (82.7 kg) Comment: self-reported  BMI 35.59 kg/m    Well nourished, well developed female in no  acute distress.   ASSESSMENT & PLAN:    1. Chronic heart failure with preserved ejection fraction- - NYHA class III - euvolemic based on patient's description - weighing daily and weight has fluctuated between 176-182 pounds but doesn't experience a sudden weight gain. Reminded to call for an overnight weight gain of >2 pounds or a weekly weight gain of >5 pounds - not adding salt and family is trying to closely monitor sodium intake of foods that are being brought to her  2: HTN- - self reported BP was good yesterday morning (127/75) - saw PCP ([facility]) 05/21/2018 - BMP 05/25/2018 reviewed and showed  sodium 139, potassium 4.3, creatinine 1.26 and GFR 48.10 - she has seen nephrology but can't recall what his name is  3: Lymphedema- - stage 2 -  elevating her legs but edema persists - wearing compression socks daily but edema persists; patient is now saying that the socks are making her legs itch - limited in her ability to exercise due to her shortness of breath - will make a referral for lymphapress compression boots  COVID-19 Education: The signs and symptoms of COVID-19 were discussed with the patient and how to seek care for testing (follow up with PCP or arrange E-visit).  The importance of social distancing was discussed today.  Patient Risk:   After full review of this patients clinical status, I feel that they are at least moderate risk at this time.  Time:   Today, I have spent 19 minutes with the patient with telehealth technology discussing heart failure, daily weights, medications, compression boots and symptoms to call about.  Medication Adjustments/Labs and Tests Ordered: Current medicines are reviewed at length with the patient today.  Concerns regarding medicines are outlined above.   Tests Ordered: No orders of the defined types were placed in this encounter.  Medication Changes: No orders of the defined types were placed in this encounter.   Disposition: Follow-up in 1 month or sooner for any questions/problems before then.   Signed, Alisa Graff, FNP  05/29/2018 9:19 AM    [facility]

## 2018-05-29 NOTE — Telephone Encounter (Signed)
LMOV for patient to call back.  Need to change appointment to Evisit.

## 2018-05-31 NOTE — Telephone Encounter (Signed)
Virtual Visit Pre-Appointment Phone Call  Steps For Call:  1. Confirm consent - "In the setting of the current Covid19 crisis, you are scheduled for a VIDEO visit with your provider on 06/19/2018 at 8:40AM.  Just as we do with many in-office visits, in order for you to participate in this visit, we must obtain consent.  If you'd like, I can send this to your mychart (if signed up) or email for you to review.  Otherwise, I can obtain your verbal consent now.  All virtual visits are billed to your insurance company just like a normal visit would be.  By agreeing to a virtual visit, we'd like you to understand that the technology does not allow for your provider to perform an examination, and thus may limit your provider's ability to fully assess your condition.  Finally, though the technology is pretty good, we cannot assure that it will always work on either your or our end, and in the setting of a video visit, we may have to convert it to a phone-only visit.  In either situation, we cannot ensure that we have a secure connection.  Are you willing to proceed?"  2. Give patient instructions for WebEx download to smartphone as below if video visit  3. Advise patient to be prepared with any vital sign or heart rhythm information, their current medicines, and a piece of paper and pen handy for any instructions they may receive the day of their visit  4. Inform patient they will receive a phone call 15 minutes prior to their appointment time (may be from unknown caller ID) so they should be prepared to answer  5. Confirm that appointment type is correct in Epic appointment notes (video vs telephone)    TELEPHONE CALL NOTE  [facility] has been deemed a candidate for a follow-up tele-health visit to limit community exposure during the Covid-19 pandemic. I spoke with the patient via phone to ensure availability of phone/video source, confirm preferred email & phone number, and discuss  instructions and expectations.  I reminded [name] to be prepared with any vital sign and/or heart rhythm information that could potentially be obtained via home monitoring, at the time of her visit. I reminded [name] to expect a phone call at the time of her visit if her visit.  Did the patient verbally acknowledge consent to treatment? [place], [place] 05/31/2018 2:10 PM  CONSENT FOR TELE-HEALTH VISIT - PLEASE REVIEW  I hereby voluntarily request, consent and authorize [facility] and its employed or contracted physicians, physician assistants, nurse practitioners or other licensed health care professionals (the Practitioner), to provide me with telemedicine health care services (the Services") as deemed necessary by the treating Practitioner. I acknowledge and consent to receive the Services by the Practitioner via telemedicine. I understand that the telemedicine visit will involve communicating with the Practitioner through live audiovisual communication technology and the disclosure of certain medical information by electronic transmission. I acknowledge that I have been given the opportunity to request an in-person assessment or other available alternative prior to the telemedicine visit and am voluntarily participating in the telemedicine visit.  I understand that I have the right to withhold or withdraw my consent to the use of telemedicine in the course of my care at any time, without affecting my right to future care or treatment, and that the Practitioner or I may terminate the telemedicine visit at any time. I understand  that I have the right to inspect all information obtained and/or recorded in the course of the telemedicine visit and may receive copies of available information for a reasonable fee.  I understand that some of the potential risks of receiving the Services via telemedicine include:   Delay or interruption in medical  evaluation due to technological equipment failure or disruption;  Information transmitted may not be sufficient (e.g. poor resolution of images) to allow for appropriate medical decision making by the Practitioner; and/or   In rare instances, security protocols could fail, causing a breach of personal health information.  Furthermore, I acknowledge that it is my responsibility to provide information about my medical history, conditions and care that is complete and accurate to the best of my ability. I acknowledge that Practitioner's advice, recommendations, and/or decision may be based on factors not within their control, such as incomplete or inaccurate data provided by me or distortions of diagnostic images or specimens that may result from electronic transmissions. I understand that the practice of medicine is not an exact science and that Practitioner makes no warranties or guarantees regarding treatment outcomes. I acknowledge that I will receive a copy of this consent concurrently upon execution via email to the email address I last provided but may also request a printed copy by calling the office of [facility].    I understand that my insurance will be billed for this visit.   I have read or had this consent read to me.  I understand the contents of this consent, which adequately explains the benefits and risks of the Services being provided via telemedicine.   I have been provided ample opportunity to ask questions regarding this consent and the Services and have had my questions answered to my satisfaction.  I give my informed consent for the services to be provided through the use of telemedicine in my medical care  By participating in this telemedicine visit I agree to the above.

## 2018-06-18 NOTE — Progress Notes (Signed)
Virtual Visit via Video Note   This visit type was conducted due to national recommendations for restrictions regarding the COVID-19 Pandemic (e.g. social distancing) in an effort to limit this patient's exposure and mitigate transmission in our community.  Due to her co-morbid illnesses, this patient is at least at moderate risk for complications without adequate follow up.  This format is felt to be most appropriate for this patient at this time.  All issues noted in this document were discussed and addressed.  A limited physical exam was performed with this format.  Please refer to the patient's chart for her consent to telehealth for [facility].   I connected with  [name] on 06/18/18 by a video enabled telemedicine application and verified that I am speaking with the correct person using two identifiers. I discussed the limitations of evaluation and management by telemedicine. The patient expressed understanding and agreed to proceed.   Evaluation Performed:  Follow-up visit  Date:  06/18/2018   ID:  [name], [name] 1924/03/22, MRN [id number]  Patient Location:  [place] [address]   Provider location:   [facility], [place] office  PCP:  McLean-Scocuzza, Nino Glow, MD  Cardiologist:  Patsy Baltimore   Chief Complaint:  Leg swelling   History of Present Illness:    [name] is a 85 y.o. female who presents via audio/video conferencing for a telehealth visit today.   The patient does not symptoms concerning for COVID-19 infection (fever, chills, cough, or new SHORTNESS OF BREATH).   Patient has a past medical history of diabetes,  hypertension,  SVT, aortic valve stenosis, s/p  TAVR September 2018 hospital admission July 2016 for SVT after she developed numerous bug bites with associated pain. She presents for follow-up of her TAVR and lower extremity edema  On torsemide 20 daily Swelling  still "presents" Took torsemide BID yesterday   Rare cramps, asking for mustard periodically  Daughter visits daily Helps keeps patient at home,  Lab Results  Component Value Date   CREATININE 1.26 (H) 05/25/2018   CREATININE 2.54 (H) 04/11/2018   CREATININE 1.37 (H) 03/13/2018    Good memory, Active, Gait instability, walker No falls Gets around in a wheelchair  Good appetite Denies chest pain No tachycardia chronic back pain  Have to periodically hold hydralazine for low blood pressure Yesterday 113/60 in the afternoon and hydralazine held     Other past medical history reviewed Previous hospitalization end of January with discharge 04/01/2016 She had sepsis, enterococcus, had TEE done in the hospital to rule out endocarditis Moderate aortic valve stenosis noted  emergency room 04/12/16  fatigue, shortness breath Felt it was from anemia, deconditioning. Was not admitted to the hospital  hospital admission July 2016,She reports that she had a rash at the time. Turns out she went walking in her yard, developed insect bites all of her legs and abdomen (chiger bits). She was in a tremendous amount of discomfort with severe itching. Likely secondary to that, she developed arrhythmia. she converted back to normal sinus rhythm in the hospital. At the time of discharge her metoprolol was increased from 50 up to 75 mg daily. She is felt well with no further episodes of tachycardia. She currently lives at home, independent  admitted to the hospital 09/20/2012 discharged on 09/25/2012 for Klebsiella UTI, shortness breath, tachycardia, SVT. Arrival to the hospital, she was very weak and was unable to swallow.  She was felt to be very dehydrated. Her potassium was repleted, she was started on metoprolol and Cardizem.  Echocardiogram in the hospital 09/19/2012 showed ejection fraction 61-60%, diastolic dysfunction, mild LVH, moderate aortic valve stenosis, normal RV function  and size  CT scan of the neck showed enlargement of the thyroid with multinodular appearance, atherosclerotic disease, right pleural effusion Lab work in the hospital 09/11/2012 showing creatinine 1.57, BUN 44 Followup blood work showed creatinine 1.07, BUN 17 on July 23    Prior CV studies:   The following studies were reviewed today: Echo 02/2018 Left ventricle: The cavity size was normal. There was moderate   concentric hypertrophy. Systolic function was normal. The   estimated ejection fraction was in the range of 50% to 55%. Wall   motion was normal; there were no regional wall motion   abnormalities. Features are consistent with a pseudonormal left   ventricular filling pattern, with concomitant abnormal relaxation   and increased filling pressure (grade 2 diastolic dysfunction). - Aortic valve: A bioprosthesis ( TAVR) was present and functioning   normally. Mean gradient (S): 15 mm Hg. Valve area (VTI): 1.51   cm^2. - Mitral valve: Calcified annulus. - Left atrium: The atrium was mildly dilated. - Atrial septum: There was an atrial septal aneurysm. - Pulmonary arteries: Systolic pressure was mildly increased. PA   peak pressure: 45 mm Hg (S).   Past Medical History:  Diagnosis Date  . Anemia   . Carotid arterial disease ([place])    a. 08/2014 U/S: Bilateral < 50% stenosis. Patent vertebrals w/ antegrade flow.   . Cervical spine arthritis   . Chronic diastolic CHF (congestive heart failure) ([place])    a. echo 2014: EF 55-60%, DD;  b. 08/2014 Echo: EF 55-60%, no RWMA, GR1DD; c. 02/2016 Echo: EF 65-70%, Gr1 DD.  Marland Kitchen Cognitive impairment   . Depression   . Diabetes mellitus without complication ([place])   . Essential hypertension   . GERD (gastroesophageal reflux disease)   . Glaucoma   . Gout   . History of renal impairment   . Hyperlipidemia   . Hypotension    a. Related to Norvasc - 11/2014 ED visit.  . Multinodular goiter    a. Noted incidentally on CT 07/2012 and carotid U/S  08/2014;  b. Nl TSH 11/2014.  Marland Kitchen Paroxysmal SVT (supraventricular tachycardia) (HCC)    a. on Toprol   . Prolapsed uterus   . Severe aortic stenosis    a. 11/15/16: s/p TAVR Edwards Sapien 3 THV (size 23 mm, model # 9600TFX, serial # [id number])  . Stroke [facility])    a. CT head 07/2014 showed small thalamic infarct  . Vitamin deficiency    Past Surgical History:  Procedure Laterality Date  . EYE SURGERY     unsure of what procedure, did know that laser was used  . IR RADIOLOGY PERIPHERAL GUIDED IV START  11/03/2016  . IR US GUIDE VASC ACCESS RIGHT  11/03/2016  . RIGHT/LEFT HEART CATH AND CORONARY ANGIOGRAPHY N/A 10/26/2016   Procedure: RIGHT/LEFT HEART CATH AND CORONARY ANGIOGRAPHY;  Surgeon: [name], MD;  Location: [facility];  Service: Cardiovascular;  Laterality: N/A;  . TEE WITHOUT CARDIOVERSION N/A 03/31/2016   Procedure: TRANSESOPHAGEAL ECHOCARDIOGRAM (TEE);  Surgeon: Minna Merritts, MD;  Location: [facility];  Service: Cardiovascular;  Laterality: N/A;  . TEE WITHOUT CARDIOVERSION N/A 11/15/2016   Procedure: TRANSESOPHAGEAL ECHOCARDIOGRAM (TEE);  Surgeon: [name], MD;  Location: [place];  Service: Open Heart Surgery;  Laterality: N/A;  . TRANSCATHETER AORTIC VALVE REPLACEMENT, TRANSFEMORAL N/A 11/15/2016   Procedure: TRANSCATHETER AORTIC VALVE REPLACEMENT, TRANSFEMORAL;  Surgeon: [name], MD;  Location: [place];  Service: Open Heart Surgery;  Laterality: N/A;  . TUBAL LIGATION       No outpatient medications have been marked as taking for the 06/19/18 encounter (Appointment) with Minna Merritts, MD.     Allergies:   Metformin hcl; Metformin; and Metformin and related   Social History   Tobacco Use  . Smoking status: Never Smoker  . Smokeless tobacco: Never Used  Substance Use Topics  . Alcohol use: No  . Drug use: No     Current Outpatient Medications on File Prior to Visit  Medication Sig Dispense Refill  . ACCU-CHEK AVIVA PLUS test strip USE 1 STRIP TO  TEST BLOOD SUGARS 3 TIMES A DAY 300 each 4  . acetaminophen (TYLENOL) 500 MG tablet Take 1 tablet (500 mg total) by mouth every 6 (six) hours as needed for mild pain or headache. 360 tablet 3  . allopurinol (ZYLOPRIM) 100 MG tablet Take 1 tablet (100 mg total) by mouth daily. 90 tablet 3  . aspirin 81 MG chewable tablet Chew 1 tablet (81 mg total) by mouth daily. 90 tablet 3  . blood glucose meter kit and supplies Dispense based on patient and insurance preference. Use 1x daily as directed. (FOR ICD-10 E10.9, E11.9). 1 each 0  . brimonidine (ALPHAGAN P) 0.1 % SOLN Place 1 drop into the right eye 2 (two) times daily.    . Carboxymethylcellulose Sodium (ARTIFICIAL TEARS OP) Apply 1 drop to eye 2 (two) times daily. Left eye    . cholecalciferol (VITAMIN D) 25 MCG (1000 UT) tablet Take 1 tablet (1,000 Units total) by mouth daily. 90 tablet 3  . clotrimazole (LOTRIMIN) 1 % cream APPLY TOPICALLY TO THE AFFECTED AREA ONCE A DAY AT BEDTIME FOR 7-14 DAYS 60 g 11  . dimethicone (PROSHIELD PLUS SKIN PROTECTANT) 1 % cream 1 application to the buttock 2 times daily to help protect skin 114 g 11  . Dorzolamide HCl-Timolol Mal (COSOPT OP) Place 1 drop into the right eye 2 (two) times daily.     . feeding supplement, ENSURE ENLIVE, (ENSURE ENLIVE) LIQD Take 237 mLs by mouth 2 (two) times daily between meals. 237 mL 12  . fluticasone (FLONASE) 50 MCG/ACT nasal spray Place 1 spray into both nostrils daily. Prn max 2 sprays total each nose 16 g 11  . gabapentin (NEURONTIN) 300 MG capsule Take 1 capsule (300 mg total) by mouth 2 (two) times daily. 180 capsule 3  . glucose blood test strip Accu-Chek Aviva Plus test strips 300 each 4  . iron polysaccharides (NIFEREX) 150 MG capsule Take 1 capsule (150 mg total) by mouth daily. Note reduction in frequency from 2x to 1 x per day (Patient taking differently: Take 150 mg by mouth daily. ) 90 capsule 3  . latanoprost (XALATAN) 0.005 % ophthalmic solution Place 1 drop into the  right eye at bedtime.     . lovastatin (MEVACOR) 40 MG tablet Take 1 tablet (40 mg total) by mouth at bedtime. 90 tablet 3  . metoprolol succinate (TOPROL-XL) 25 MG 24 hr tablet Take 1 tablet (25 mg total) by mouth daily. 90 tablet 3  . Multiple Vitamin (MULTI-VITAMIN PO) Take 1 tablet by mouth daily.    . mupirocin ointment (BACTROBAN) 2 % Place 1 application into the nose 2 (two) times daily. 22 g 0  .  Nutritional Supplements (BOOST GLUCOSE CONTROL PO) Take 1 Container by mouth daily.    Glory Rosebush DELICA LANCETS 12X MISC USE TO TEST BLOOD SUGAR ONCE A DAY  0  . pantoprazole (PROTONIX) 40 MG tablet Take 1 tablet (40 mg total) by mouth daily. 30 minutes before breakfast OR dinner 90 tablet 3  . pilocarpine (PILOCAR) 4 % ophthalmic solution Place 1 drop into the right eye 4 (four) times daily.     . polyethylene glycol (MIRALAX) packet Take 17 g by mouth daily. prn 30 each 11  . potassium chloride (K-DUR,KLOR-CON) 10 MEQ tablet Take 10 mEq by mouth daily. With fluid pill    . senna-docusate (SENOKOT-S) 8.6-50 MG tablet Take 2 tablets by mouth daily as needed for mild constipation.     . sertraline (ZOLOFT) 50 MG tablet Take 1 tablet (50 mg total) by mouth daily. 90 tablet 3  . sitaGLIPtin (JANUVIA) 50 MG tablet Take 1 tablet (50 mg total) by mouth daily. 90 tablet 3  . torsemide (DEMADEX) 20 MG tablet Take 1 tablet (20 mg total) by mouth daily. 90 tablet 3  . traZODone (DESYREL) 100 MG tablet Take 0.5-1 tablets (50-100 mg total) by mouth at bedtime as needed. 90 tablet 3  . vitamin C (ASCORBIC ACID) 500 MG tablet Take 1 tablet (500 mg total) by mouth daily. 90 tablet 3  . Zinc Oxide 10 % OINT Apply 1 application topically daily as needed (rash). Applied to patient's bottom 226.8 g 11   No current facility-administered medications on file prior to visit.      Family Hx: The patient's family history includes Colitis in her father; Diabetes in her mother; Glaucoma in her mother; Peptic Ulcer in  her father.  ROS:   Please see the history of present illness.    Review of Systems  Constitutional: Negative.   Respiratory: Negative.   Cardiovascular: Negative.   Gastrointestinal: Negative.   Musculoskeletal: Positive for back pain.       Unsteady gait  Neurological: Negative.   Psychiatric/Behavioral: Negative.   All other systems reviewed and are negative.     Labs/Other Tests and Data Reviewed:    Recent Labs: 01/04/2018: TSH 1.21 02/18/2018: B Natriuretic Peptide 68.0 03/05/2018: Hemoglobin 11.5; Platelets 168 04/11/2018: ALT 9; Magnesium 1.9 05/25/2018: BUN 22; Creatinine, Ser 1.26; Potassium 4.3; Sodium 139   Recent Lipid Panel Lab Results  Component Value Date/Time   CHOL 144 04/11/2018 09:41 AM   TRIG 119.0 04/11/2018 09:41 AM   HDL 41.50 04/11/2018 09:41 AM   CHOLHDL 3 04/11/2018 09:41 AM   LDLCALC 79 04/11/2018 09:41 AM    Wt Readings from Last 3 Encounters:  05/29/18 182 lb 4 oz (82.7 kg)  05/21/18 186 lb 12.8 oz (84.7 kg)  04/30/18 180 lb (81.6 kg)     Exam:    Vital Signs: Vital signs may also be detailed in the HPI There were no vitals taken for this visit.  Wt Readings from Last 3 Encounters:  05/29/18 182 lb 4 oz (82.7 kg)  05/21/18 186 lb 12.8 oz (84.7 kg)  04/30/18 180 lb (81.6 kg)   Temp Readings from Last 3 Encounters:  05/21/18 98.1 F (36.7 C)  04/16/18 97.7 F (36.5 C) (Oral)  04/11/18 97.7 F (36.5 C) (Oral)   BP Readings from Last 3 Encounters:  05/29/18 127/75  05/21/18 (!) 142/74  04/30/18 (!) 154/80   Pulse Readings from Last 3 Encounters:  05/29/18 70  05/21/18 75  04/30/18 77  127/73 Pulse 73 Resp 16  Well nourished, well developed female in no acute distress. Constitutional:  oriented to person, place, and time. No distress.  Head: Normocephalic and atraumatic.  Eyes:  no discharge. No scleral icterus.  Neck: Normal range of motion. Neck supple.  Pulmonary/Chest: No audible wheezing, no distress, appears  comfortable Musculoskeletal: Normal range of motion.  no  tenderness or deformity.  Neurological:   Coordination normal. Full exam not performed Skin:  No rash Psychiatric:  normal mood and affect. behavior is normal. Thought content normal.    ASSESSMENT & PLAN:    SVT (supraventricular tachycardia) (HCC) No significant arrhythmia Stable. Stay on metoprolol  Essential hypertension, malignant well controlled On metoprolol  Chronic diastolic CHF (congestive heart failure) ([place]) Euvolemic Daughter is giving extra torsemide as needed Daughter would prefer if she takes 30 mg daily Feels the torsemide 20 does not working very well, often having to give extra  Aortic valve stenosis, s/p TAVR  Echocardiogram done 02/2018 Valve functioning well  SOB (shortness of breath) Euvolemic, walking with walker in her house Torsemide for heart failure  Diabetes mellitus without complication ([place]) Management per primary care " Eating too much" per the daughter Weight stable  Acute midline low back pain, with sciatica presence unspecified Chronic back pain, stable Walks with walker  COVID-19 Education: The signs and symptoms of COVID-19 were discussed with the patient and how to seek care for testing (follow up with PCP or arrange E-visit).  The importance of social distancing was discussed today.  Patient Risk:   After full review of this patients clinical status, I feel that they are at least moderate risk at this time.  Time:   Today, I have spent 25 minutes with the patient with telehealth technology discussing the cardiac and medical problems/diagnoses detailed above   10 min spent reviewing the chart prior to patient visit today   Medication Adjustments/Labs and Tests Ordered: Current medicines are reviewed at length with the patient today.  Concerns regarding medicines are outlined above.   Tests Ordered: No tests ordered   Medication Changes: No changes made    Disposition: Follow-up in 12 months   Signed, Ida Rogue, MD  06/18/2018 1:12 PM    [place] Office [address] [place] [address], [place], [place] [address]

## 2018-06-19 ENCOUNTER — Telehealth (INDEPENDENT_AMBULATORY_CARE_PROVIDER_SITE_OTHER): Payer: PPO | Admitting: Cardiovascular Disease

## 2018-06-19 ENCOUNTER — Other Ambulatory Visit: Payer: Self-pay

## 2018-06-19 DIAGNOSIS — I272 Pulmonary hypertension, unspecified: Secondary | ICD-10-CM | POA: Diagnosis not present

## 2018-06-19 DIAGNOSIS — E1122 Type 2 diabetes mellitus with diabetic chronic kidney disease: Secondary | ICD-10-CM | POA: Diagnosis not present

## 2018-06-19 DIAGNOSIS — N184 Chronic kidney disease, stage 4 (severe): Secondary | ICD-10-CM

## 2018-06-19 DIAGNOSIS — Z952 Presence of prosthetic heart valve: Secondary | ICD-10-CM | POA: Diagnosis not present

## 2018-06-19 DIAGNOSIS — I6523 Occlusion and stenosis of bilateral carotid arteries: Secondary | ICD-10-CM | POA: Diagnosis not present

## 2018-06-19 DIAGNOSIS — I350 Nonrheumatic aortic (valve) stenosis: Secondary | ICD-10-CM | POA: Diagnosis not present

## 2018-06-19 DIAGNOSIS — I5032 Chronic diastolic (congestive) heart failure: Secondary | ICD-10-CM

## 2018-06-19 DIAGNOSIS — I890 Lymphedema, not elsewhere classified: Secondary | ICD-10-CM

## 2018-06-19 DIAGNOSIS — I10 Essential (primary) hypertension: Secondary | ICD-10-CM

## 2018-06-19 DIAGNOSIS — I700 Atherosclerosis of aorta: Secondary | ICD-10-CM | POA: Diagnosis not present

## 2018-06-19 DIAGNOSIS — I110 Hypertensive heart disease with heart failure: Secondary | ICD-10-CM | POA: Diagnosis not present

## 2018-06-19 MED ORDER — TORSEMIDE 20 MG PO TABS: 30.0000 mg | ORAL_TABLET | Freq: Every day | ORAL | 3 refills | Status: DC

## 2018-06-19 NOTE — Patient Instructions (Signed)
Medication Instructions:  Please increase the torsemide up to 30 mg daily  If you need a refill on your cardiac medications before your next appointment, please call your pharmacy.    Lab work: No new labs needed   If you have labs (blood work) drawn today and your tests are completely normal, you will receive your results only by: Marland Kitchen MyChart Message (if you have MyChart) OR . A paper copy in the mail If you have any lab test that is abnormal or we need to change your treatment, we will call you to review the results.   Testing/Procedures: No new testing needed   Follow-Up: At [facility], you and your health needs are our priority.  As part of our continuing mission to provide you with exceptional heart care, we have created designated Provider Care Teams.  These Care Teams include your primary Cardiologist (physician) and Advanced Practice Providers (APPs -  Physician Assistants and Nurse Practitioners) who all work together to provide you with the care you need, when you need it.  . You will need a follow up appointment in 12 months .   Please call our office 2 months in advance to schedule this appointment.    . Providers on your designated Care Team:   . Murray Hodgkins, NP . Christell Faith, PA-C . Marrianne Mood, PA-C  Any Other Special Instructions Will Be Listed Below (If Applicable).  For educational health videos Log in to : [email or web address] Or : [email or web address], password : [email or web address]

## 2018-06-26 ENCOUNTER — Telehealth: Payer: Self-pay

## 2018-06-26 NOTE — Telephone Encounter (Signed)
TELEPHONE CALL NOTE  Jeyda M Shoffner [name] has been deemed a candidate for a follow-up tele-health visit to limit community exposure during the Covid-19 pandemic. I spoke with the patient via phone to ensure availability of phone/video source, confirm preferred email & phone number, discuss instructions and expectations, and review consent.   I reminded Torianna Clare Gandy to be prepared with any vital sign and/or heart rhythm information that could potentially be obtained via home monitoring, at the time of her visit.  Finally, I reminded Sequoyah Clare Gandy to expect an e-mail containing a link for their video-based visit approximately 15 minutes before her visit, or alternatively, a phone call at the time of her visit if her visit is planned to be a phone encounter.  Did the patient verbally consent to treatment as below? YES  Gaylord Shih, CMA 06/26/2018 10:54 AM  CONSENT FOR TELE-HEALTH VISIT - PLEASE REVIEW  I hereby voluntarily request, consent and authorize [facility] and its employed or contracted physicians, physician assistants, nurse practitioners or other licensed health care professionals (the Practitioner), to provide me with telemedicine health care services (the "Services") as deemed necessary by the treating Practitioner. I acknowledge and consent to receive the Services by the Practitioner via telemedicine. I understand that the telemedicine visit will involve communicating with the Practitioner through telephonic communication technology and the disclosure of certain medical information by electronic transmission. I acknowledge that I have been given the opportunity to request an in-person assessment or other available alternative prior to the telemedicine visit and am voluntarily participating in the telemedicine visit.  I understand that I have the right to withhold or withdraw my consent to the use of telemedicine in the course of my care at any  time, without affecting my right to future care or treatment, and that the Practitioner or I may terminate the telemedicine visit at any time. I understand that I have the right to inspect all information obtained and/or recorded in the course of the telemedicine visit and may receive copies of available information for a reasonable fee.  I understand that some of the potential risks of receiving the Services via telemedicine include:  Marland Kitchen Delay or interruption in medical evaluation due to technological equipment failure or disruption; . Information transmitted may not be sufficient (e.g. poor resolution of images) to allow for appropriate medical decision making by the Practitioner; and/or  . In rare instances, security protocols could fail, causing a breach of personal health information.  Furthermore, I acknowledge that it is my responsibility to provide information about my medical history, conditions and care that is complete and accurate to the best of my ability. I acknowledge that Practitioner's advice, recommendations, and/or decision may be based on factors not within their control, such as incomplete or inaccurate data provided by me or lack of visual representation. I understand that the practice of medicine is not an exact science and that Practitioner makes no warranties or guarantees regarding treatment outcomes. I acknowledge that I will receive a copy of this consent concurrently upon execution via email to the email address I last provided but may also request a printed copy by calling the office of [facility].    I understand that my insurance may be billed for this visit.   I have read or had this consent read to me. . I understand the contents of this consent, which adequately explains the benefits and risks of the Services being provided via  telemedicine.  . I have been provided ample opportunity to ask questions regarding this consent and the Services and have had my  questions answered to my satisfaction. . I give my informed consent for the services to be provided through the use of telemedicine in my medical care  By participating in this telemedicine visit I agree to the above.

## 2018-06-26 NOTE — Telephone Encounter (Signed)
   TELEPHONE CALL NOTE  This patient has been deemed a candidate for follow-up tele-health visit to limit community exposure during the Covid-19 pandemic. I spoke with the patient via phone to discuss instructions. The patient was advised to review the section on consent for treatment as well. The patient will receive a phone call 2-3 days prior to their E-Visit at which time consent will be verbally confirmed. A Virtual Office Visit appointment type has been scheduled for 07/05/2018 with Darylene Price FNP.  Gaylord Shih, CMA 06/26/2018 10:54 AM

## 2018-06-28 ENCOUNTER — Telehealth: Payer: PPO | Admitting: Family

## 2018-07-05 ENCOUNTER — Other Ambulatory Visit: Payer: Self-pay

## 2018-07-05 ENCOUNTER — Ambulatory Visit: Payer: PPO | Attending: Family | Admitting: Family

## 2018-07-05 ENCOUNTER — Encounter: Payer: Self-pay | Admitting: Family

## 2018-07-05 VITALS — BP 124/78 | HR 66 | Wt 185.0 lb

## 2018-07-05 DIAGNOSIS — I5032 Chronic diastolic (congestive) heart failure: Secondary | ICD-10-CM

## 2018-07-05 DIAGNOSIS — I890 Lymphedema, not elsewhere classified: Secondary | ICD-10-CM

## 2018-07-05 DIAGNOSIS — I10 Essential (primary) hypertension: Secondary | ICD-10-CM

## 2018-07-05 NOTE — Patient Instructions (Signed)
Continue weighing daily and call for an overnight weight gain of > 2 pounds or a weekly weight gain of >5 pounds. 

## 2018-07-05 NOTE — Progress Notes (Signed)
Virtual Visit via Telephone Note   Evaluation Performed:  Follow-up visit  This visit type was conducted due to national recommendations for restrictions regarding the COVID-19 Pandemic (e.g. social distancing).  This format is felt to be most appropriate for this patient at this time.  All issues noted in this document were discussed and addressed.  No physical exam was performed (except for noted visual exam findings with Video Visits).  Please refer to the patient's chart (MyChart message for video visits and phone note for telephone visits) for the patient's consent to telehealth for [facility]  Date:  07/05/2018   ID:  [name], DOB [birth year], MRN [id number]  Patient Location:  [place] [address]   Provider location:   [facility] [place] [address], [place] [address]  PCP:  McLean-Scocuzza, Nino Glow, MD  Cardiologist: Ida Rogue, MD Electrophysiologist:  None   Chief Complaint:  Fatigue  History of Present Illness:    [name] is a 85 y.o. female who presents via audio/video conferencing for a telehealth visit today.  Patient verified DOB and address.  The patient does not have symptoms concerning for COVID-19 infection (fever, chills, cough, or new SHORTNESS OF BREATH).   Patient reports moderate fatigue upon minimal exertion. She describes this as chronic in nature having been present for several years. She has associated back pain, pedal edema, shortness of breath, cough and chronic difficulty sleeping. She denies any dizziness, palpitations, chest pain or weight gain. Says that her weight has ranged from 185-188 pounds.   Prior CV studies:   The following studies were reviewed today:  Echo report from 03/09/2018 reviewed and showed an EF of 50-55% along with mildly dilated left atrium, ASD and and elevated PA pressure of 45 mm Hg.   Past Medical History:  Diagnosis Date  . Anemia    . Carotid arterial disease ([place])    a. 08/2014 U/S: Bilateral < 50% stenosis. Patent vertebrals w/ antegrade flow.   . Cervical spine arthritis   . Chronic diastolic CHF (congestive heart failure) ([place])    a. echo 2014: EF 55-60%, DD;  b. 08/2014 Echo: EF 55-60%, no RWMA, GR1DD; c. 02/2016 Echo: EF 65-70%, Gr1 DD.  Marland Kitchen Cognitive impairment   . Depression   . Diabetes mellitus without complication ([place])   . Essential hypertension   . GERD (gastroesophageal reflux disease)   . Glaucoma   . Gout   . History of renal impairment   . Hyperlipidemia   . Hypotension    a. Related to Norvasc - 11/2014 ED visit.  . Multinodular goiter    a. Noted incidentally on CT 07/2012 and carotid U/S 08/2014;  b. Nl TSH 11/2014.  Marland Kitchen Paroxysmal SVT (supraventricular tachycardia) (HCC)    a. on Toprol   . Prolapsed uterus   . Severe aortic stenosis    a. 11/15/16: s/p TAVR Edwards Sapien 3 THV (size 23 mm, model # 9600TFX, serial # [id number])  . Stroke [facility])    a. CT head 07/2014 showed small thalamic infarct  . Vitamin deficiency    Past Surgical History:  Procedure Laterality Date  . EYE SURGERY     unsure of what procedure, did know that laser was used  . IR RADIOLOGY PERIPHERAL GUIDED IV START  11/03/2016  . IR US GUIDE VASC ACCESS RIGHT  11/03/2016  . RIGHT/LEFT HEART CATH AND CORONARY ANGIOGRAPHY N/A 10/26/2016   Procedure: RIGHT/LEFT  HEART CATH AND CORONARY ANGIOGRAPHY;  Surgeon: [name], MD;  Location: [place] [facility];  Service: Cardiovascular;  Laterality: N/A;  . TEE WITHOUT CARDIOVERSION N/A 03/31/2016   Procedure: TRANSESOPHAGEAL ECHOCARDIOGRAM (TEE);  Surgeon: Minna Merritts, MD;  Location: [facility] ORS;  Service: Cardiovascular;  Laterality: N/A;  . TEE WITHOUT CARDIOVERSION N/A 11/15/2016   Procedure: TRANSESOPHAGEAL ECHOCARDIOGRAM (TEE);  Surgeon: [name], MD;  Location: [place];  Service: Open Heart Surgery;  Laterality: N/A;  . TRANSCATHETER AORTIC VALVE REPLACEMENT, TRANSFEMORAL N/A  11/15/2016   Procedure: TRANSCATHETER AORTIC VALVE REPLACEMENT, TRANSFEMORAL;  Surgeon: [name], MD;  Location: [place];  Service: Open Heart Surgery;  Laterality: N/A;  . TUBAL LIGATION       Current Meds  Medication Sig  . ACCU-CHEK AVIVA PLUS test strip USE 1 STRIP TO TEST BLOOD SUGARS 3 TIMES A DAY  . acetaminophen (TYLENOL) 500 MG tablet Take 1 tablet (500 mg total) by mouth every 6 (six) hours as needed for mild pain or headache.  . allopurinol (ZYLOPRIM) 100 MG tablet Take 1 tablet (100 mg total) by mouth daily.  Marland Kitchen aspirin 81 MG chewable tablet Chew 1 tablet (81 mg total) by mouth daily.  . blood glucose meter kit and supplies Dispense based on patient and insurance preference. Use 1x daily as directed. (FOR ICD-10 E10.9, E11.9).  . brimonidine (ALPHAGAN P) 0.1 % SOLN Place 1 drop into the right eye 2 (two) times daily.  . Carboxymethylcellulose Sodium (ARTIFICIAL TEARS OP) Apply 1 drop to eye 2 (two) times daily. Left eye  . cholecalciferol (VITAMIN D) 25 MCG (1000 UT) tablet Take 1 tablet (1,000 Units total) by mouth daily.  . clotrimazole (LOTRIMIN) 1 % cream APPLY TOPICALLY TO THE AFFECTED AREA ONCE A DAY AT BEDTIME FOR 7-14 DAYS  . dimethicone (PROSHIELD PLUS SKIN PROTECTANT) 1 % cream 1 application to the buttock 2 times daily to help protect skin  . Dorzolamide HCl-Timolol Mal (COSOPT OP) Place 1 drop into the right eye 2 (two) times daily.   . feeding supplement, ENSURE ENLIVE, (ENSURE ENLIVE) LIQD Take 237 mLs by mouth 2 (two) times daily between meals.  . fluticasone (FLONASE) 50 MCG/ACT nasal spray Place 1 spray into both nostrils daily. Prn max 2 sprays total each nose  . gabapentin (NEURONTIN) 300 MG capsule Take 1 capsule (300 mg total) by mouth 2 (two) times daily.  Marland Kitchen glucose blood test strip Accu-Chek Aviva Plus test strips  . iron polysaccharides (NIFEREX) 150 MG capsule Take 1 capsule (150 mg total) by mouth daily. Note reduction in frequency from 2x to 1 x per day  (Patient taking differently: Take 150 mg by mouth daily. )  . latanoprost (XALATAN) 0.005 % ophthalmic solution Place 1 drop into the right eye at bedtime.   . lovastatin (MEVACOR) 40 MG tablet Take 1 tablet (40 mg total) by mouth at bedtime.  . metoprolol succinate (TOPROL-XL) 25 MG 24 hr tablet Take 1 tablet (25 mg total) by mouth daily.  . Multiple Vitamin (MULTI-VITAMIN PO) Take 1 tablet by mouth daily.  . mupirocin ointment (BACTROBAN) 2 % Place 1 application into the nose 2 (two) times daily. (Patient taking differently: Apply 1 application topically 2 (two) times daily. )  . Nutritional Supplements (BOOST GLUCOSE CONTROL PO) Take 1 Container by mouth daily.  Glory Rosebush DELICA LANCETS 11H MISC USE TO TEST BLOOD SUGAR ONCE A DAY  . pantoprazole (PROTONIX) 40 MG tablet Take 1 tablet (40 mg total) by mouth daily.  30 minutes before breakfast OR dinner  . pilocarpine (PILOCAR) 4 % ophthalmic solution Place 1 drop into the right eye 4 (four) times daily.   . potassium chloride (K-DUR,KLOR-CON) 10 MEQ tablet Take 10 mEq by mouth daily. With fluid pill  . senna-docusate (SENOKOT-S) 8.6-50 MG tablet Take 2 tablets by mouth daily as needed for mild constipation.   . sertraline (ZOLOFT) 50 MG tablet Take 1 tablet (50 mg total) by mouth daily.  . sitaGLIPtin (JANUVIA) 50 MG tablet Take 1 tablet (50 mg total) by mouth daily.  Marland Kitchen torsemide (DEMADEX) 20 MG tablet Take 1.5 tablets (30 mg total) by mouth daily.  . traZODone (DESYREL) 100 MG tablet Take 0.5-1 tablets (50-100 mg total) by mouth at bedtime as needed.  . vitamin C (ASCORBIC ACID) 500 MG tablet Take 1 tablet (500 mg total) by mouth daily.  . Zinc Oxide 10 % OINT Apply 1 application topically daily as needed (rash). Applied to patient's bottom     Allergies:   Metformin hcl; Metformin; and Metformin and related   Social History   Tobacco Use  . Smoking status: Never Smoker  . Smokeless tobacco: Never Used  Substance Use Topics  . Alcohol  use: No  . Drug use: No     Family Hx: The patient's family history includes Colitis in her father; Diabetes in her mother; Glaucoma in her mother; Peptic Ulcer in her father.  ROS:   Please see the history of present illness.     All other systems reviewed and are negative.   Labs/Other Tests and Data Reviewed:    Recent Labs: 01/04/2018: TSH 1.21 02/18/2018: B Natriuretic Peptide 68.0 03/05/2018: Hemoglobin 11.5; Platelets 168 04/11/2018: ALT 9; Magnesium 1.9 05/25/2018: BUN 22; Creatinine, Ser 1.26; Potassium 4.3; Sodium 139   Recent Lipid Panel Lab Results  Component Value Date/Time   CHOL 144 04/11/2018 09:41 AM   TRIG 119.0 04/11/2018 09:41 AM   HDL 41.50 04/11/2018 09:41 AM   CHOLHDL 3 04/11/2018 09:41 AM   LDLCALC 79 04/11/2018 09:41 AM    Wt Readings from Last 3 Encounters:  07/05/18 185 lb (83.9 kg)  05/29/18 182 lb 4 oz (82.7 kg)  05/21/18 186 lb 12.8 oz (84.7 kg)     Exam:    Vital Signs:  BP 124/78 Comment: self-reported  Pulse 66 Comment: self-reported  Wt 185 lb (83.9 kg) Comment: self-reported  BMI 36.13 kg/m    Well nourished, well developed female in no  acute distress.   ASSESSMENT & PLAN:    1. Chronic heart failure with preserved ejection fraction- - NYHA class III - euvolemic based on patient's description - weighing daily and weight has fluctuated between 185-188 pounds. Reminded to call for an overnight weight gain of >2 pounds or a weekly weight gain of >5 pounds - not adding salt and family is trying to closely monitor sodium intake of foods that are being brought to her - had telemedicine visit with cardiology Rockey Situ) 06/19/2018 and is now taking 78m furosemide daily - had TAVR Sept 2018  2: HTN- - self reported BP was good today (124/78) - saw PCP (Arnett) 05/21/2018 - BMP 05/25/2018 reviewed and showed sodium 139, potassium 4.3, creatinine 1.26 and GFR 48.10 - she has seen nephrology but can't recall what his name is  3:  Lymphedema- - stage 2 - elevating her legs but edema persists - hasn't been wearing her compression socks consistently due to itching; encouraged her to wear them every day even if  for a short period of time - limited in her ability to exercise due to her shortness of breath - referral made for lymphapress compression boots but denied through [facility] without ankle measurements. Will have to measure patient's legs at her next visit and try the referral again.   COVID-19 Education: The signs and symptoms of COVID-19 were discussed with the patient and how to seek care for testing (follow up with PCP or arrange E-visit).  The importance of social distancing was discussed today.  Patient Risk:   After full review of this patients clinical status, I feel that they are at least moderate risk at this time.  Time:   Today, I have spent 13 minutes with the patient with telehealth technology discussing medications, diet and symptoms to report.     Medication Adjustments/Labs and Tests Ordered: Current medicines are reviewed at length with the patient today.  Concerns regarding medicines are outlined above.   Tests Ordered: No orders of the defined types were placed in this encounter.  Medication Changes: No orders of the defined types were placed in this encounter.   Disposition:  Follow-up in 3 months or sooner for any questions/problems before then.   Signed, Alisa Graff, FNP  07/05/2018 11:41 AM    [facility]

## 2018-07-24 ENCOUNTER — Telehealth: Payer: Self-pay | Admitting: Family

## 2018-07-24 ENCOUNTER — Other Ambulatory Visit: Payer: Self-pay | Admitting: Family

## 2018-07-24 DIAGNOSIS — I5033 Acute on chronic diastolic (congestive) heart failure: Secondary | ICD-10-CM

## 2018-07-24 NOTE — Telephone Encounter (Signed)
Received phone call from patient's daughter, [name], regarding patient's swelling in her legs. She says that the edema is worsening and she's now having some blisters forming resulting in weeping of fluids. Her weight had gone up so [name] gave her 40mg  torsemide (normally takes 30mg ) yesterday as well as 3 days prior. Patient currently denies any shortness of breath or chest pain.   Will schedule patient for IV lasix tomorrow as patient's aide says it would be difficulty to get her here today. [name] was informed of the date/ time. Have asked [name] to call us back later this week to update on swelling.

## 2018-07-25 ENCOUNTER — Ambulatory Visit
Admission: RE | Admit: 2018-07-25 | Discharge: 2018-07-25 | Disposition: A | Discharge status: Home / Self Care | Payer: PPO | Source: Ambulatory Visit | Attending: Internal Medicine | Admitting: Internal Medicine

## 2018-07-25 ENCOUNTER — Other Ambulatory Visit: Payer: Self-pay

## 2018-07-25 ENCOUNTER — Other Ambulatory Visit: Payer: Self-pay | Admitting: Family

## 2018-07-25 DIAGNOSIS — I5033 Acute on chronic diastolic (congestive) heart failure: Secondary | ICD-10-CM | POA: Insufficient documentation

## 2018-07-25 DIAGNOSIS — R600 Localized edema: Secondary | ICD-10-CM

## 2018-07-25 LAB — BASIC METABOLIC PANEL
Anion gap: 12 (ref 5–15)
BUN: 17 mg/dL (ref 8–23)
CO2: 31 mmol/L (ref 22–32)
Calcium: 10.1 mg/dL (ref 8.9–10.3)
Chloride: 97 mmol/L — ABNORMAL LOW (ref 98–111)
Creatinine, Ser: 1.22 mg/dL — ABNORMAL HIGH (ref 0.44–1.00)
GFR calc Af Amer: 45 mL/min — ABNORMAL LOW (ref >60)
GFR calc non Af Amer: 39 mL/min — ABNORMAL LOW (ref >60)
Glucose, Bld: 182 mg/dL — ABNORMAL HIGH (ref 70–99)
Potassium: 4 mmol/L (ref 3.5–5.1)
Sodium: 140 mmol/L (ref 135–145)

## 2018-07-25 LAB — BRAIN NATRIURETIC PEPTIDE: B Natriuretic Peptide: 95 pg/mL (ref 0.0–100.0)

## 2018-07-25 MED ORDER — FUROSEMIDE 10 MG/ML IJ SOLN
INTRAMUSCULAR | Status: AC
  Administered 2018-07-25: 14:00:00 80 mg via INTRAVENOUS
  Filled 2018-07-25: qty 8

## 2018-07-25 MED ORDER — SODIUM CHLORIDE FLUSH 0.9 % IV SOLN
INTRAVENOUS | Status: AC
  Filled 2018-07-25: qty 10

## 2018-07-25 MED ORDER — POTASSIUM CHLORIDE CRYS ER 20 MEQ PO TBCR
EXTENDED_RELEASE_TABLET | ORAL | Status: AC
  Administered 2018-07-25: 40 meq via ORAL
  Filled 2018-07-25: qty 2

## 2018-07-25 MED ORDER — FUROSEMIDE 10 MG/ML IJ SOLN
80.0000 mg | Freq: Once | INTRAMUSCULAR | Status: AC
  Administered 2018-07-25: 80 mg via INTRAVENOUS

## 2018-07-25 MED ORDER — POTASSIUM CHLORIDE CRYS ER 20 MEQ PO TBCR
40.0000 meq | EXTENDED_RELEASE_TABLET | Freq: Once | ORAL | Status: AC
  Administered 2018-07-25: 40 meq via ORAL

## 2018-07-25 NOTE — OR Nursing (Signed)
Patient arrived to [facility] via wheelchair and was able to state that she was here to get medicine to get fluid off. IV started after labs were drawn, lasix given and kdur given. Patient tolerated well but voided sponts in her depends and got her skirt wet. Patient was placed on bedside commode and voided again moderate amt. No depends available in the hospital so a large green diaper with tabs was applied and hospital gown put on top of her shirt. Daughter called for pickup and patient transported via wheelchair to care of her daughter [name].

## 2018-07-26 ENCOUNTER — Other Ambulatory Visit: Payer: Self-pay | Admitting: Internal Medicine

## 2018-07-26 ENCOUNTER — Telehealth: Payer: Self-pay

## 2018-07-26 DIAGNOSIS — R600 Localized edema: Secondary | ICD-10-CM

## 2018-07-26 DIAGNOSIS — R238 Other skin changes: Secondary | ICD-10-CM

## 2018-07-26 MED ORDER — MUPIROCIN 2 % EX OINT: 1.0000 "application " | TOPICAL_OINTMENT | Freq: Two times a day (BID) | CUTANEOUS | 2 refills | Status: DC

## 2018-07-26 NOTE — Telephone Encounter (Signed)
She can use bactroban to blisters on skin 2x per day  If they are not resolving I rec wound clinic for further care  Inform crystal   [place]

## 2018-07-26 NOTE — Telephone Encounter (Signed)
Copied from [place] [id number]. Topic: General - Other >> Jul 25, 2018  4:22 PM Leward Quan A wrote: Reason for CRM: Percell Locus (Daughter) called to request an Rx sent to the pharmacy for the topical last prescribed for blisters on the patients legs. She is not sure which medication it is and also need clarification on what the mupirocin ointment (BACTROBAN) 2 % is to be used for and how to use it. Ph# [phone]

## 2018-07-26 NOTE — Telephone Encounter (Signed)
[name] has been informed.

## 2018-07-30 ENCOUNTER — Other Ambulatory Visit: Payer: Self-pay

## 2018-07-30 MED ORDER — POTASSIUM CHLORIDE CRYS ER 10 MEQ PO TBCR: 10.0000 meq | EXTENDED_RELEASE_TABLET | Freq: Every day | ORAL | 3 refills | Status: DC

## 2018-07-31 ENCOUNTER — Encounter (INDEPENDENT_AMBULATORY_CARE_PROVIDER_SITE_OTHER): Payer: PPO

## 2018-08-02 ENCOUNTER — Ambulatory Visit (INDEPENDENT_AMBULATORY_CARE_PROVIDER_SITE_OTHER): Payer: PPO | Admitting: Vascular Surgery

## 2018-08-02 ENCOUNTER — Other Ambulatory Visit: Payer: Self-pay

## 2018-08-02 ENCOUNTER — Encounter (INDEPENDENT_AMBULATORY_CARE_PROVIDER_SITE_OTHER): Payer: PPO

## 2018-08-02 ENCOUNTER — Encounter: Payer: PPO | Attending: Physician Assistant | Admitting: Physician Assistant

## 2018-08-02 DIAGNOSIS — I890 Lymphedema, not elsewhere classified: Secondary | ICD-10-CM | POA: Insufficient documentation

## 2018-08-02 DIAGNOSIS — H409 Unspecified glaucoma: Secondary | ICD-10-CM | POA: Diagnosis not present

## 2018-08-02 DIAGNOSIS — E11622 Type 2 diabetes mellitus with other skin ulcer: Secondary | ICD-10-CM | POA: Insufficient documentation

## 2018-08-02 DIAGNOSIS — M109 Gout, unspecified: Secondary | ICD-10-CM | POA: Diagnosis not present

## 2018-08-02 DIAGNOSIS — Z809 Family history of malignant neoplasm, unspecified: Secondary | ICD-10-CM | POA: Insufficient documentation

## 2018-08-02 DIAGNOSIS — Z841 Family history of disorders of kidney and ureter: Secondary | ICD-10-CM | POA: Diagnosis not present

## 2018-08-02 DIAGNOSIS — Z833 Family history of diabetes mellitus: Secondary | ICD-10-CM | POA: Diagnosis not present

## 2018-08-02 DIAGNOSIS — Z8249 Family history of ischemic heart disease and other diseases of the circulatory system: Secondary | ICD-10-CM | POA: Insufficient documentation

## 2018-08-02 DIAGNOSIS — I110 Hypertensive heart disease with heart failure: Secondary | ICD-10-CM | POA: Insufficient documentation

## 2018-08-02 DIAGNOSIS — I2510 Atherosclerotic heart disease of native coronary artery without angina pectoris: Secondary | ICD-10-CM | POA: Diagnosis not present

## 2018-08-02 DIAGNOSIS — I509 Heart failure, unspecified: Secondary | ICD-10-CM | POA: Insufficient documentation

## 2018-08-02 NOTE — Progress Notes (Signed)
Cassandra ANNALYNNE, IBANEZ ([id number]) Visit Report for 08/02/2018 Abuse/Suicide Risk Screen Details Patient Name: [name], [name]. Date of Service: 08/02/2018 1:15 PM Medical Record Number: [id number] Patient Account Number: [id number] Date of Birth/Sex: [birth year] (85 y.o. F) Treating RN: Montey Hora Primary Care Osceola Holian: Myrtie Hawk Other Clinician: Referring Cloys Vera: Myrtie Hawk Treating Chontel Warning/Extender: Melburn Hake, HOYT Weeks in Treatment: 0 Abuse/Suicide Risk Screen Items Answer ABUSE RISK SCREEN: Has anyone close to you tried to hurt or harm you recentlyo No Do you feel uncomfortable with anyone in your familyo No Has anyone forced you do things that you didnot want to doo No Electronic Signature(s) Signed: 08/02/2018 4:45:05 PM By: Montey Hora Entered By: Montey Hora on 08/02/2018 13:33:05 [name], [name] ([id number]) -------------------------------------------------------------------------------- Activities of Daily Living Details Patient Name: [name], Cassandra M. Date of Service: 08/02/2018 1:15 PM Medical Record Number: [id number] Patient Account Number: [id number] Date of Birth/Sex: 12/01/1924 (85 y.o. F) Treating RN: Montey Hora Primary Care Makoto Sellitto: Myrtie Hawk Other Clinician: Referring Carsen Leaf: Myrtie Hawk Treating Baylon Santelli/Extender: Melburn Hake, HOYT Weeks in Treatment: 0 Activities of Daily Living Items Answer Activities of Daily Living (Please select one for each item) Drive Automobile Not Able Take Medications Need Assistance Use Telephone Need Assistance Care for Appearance Need Assistance Use Toilet Need Assistance Bath / Shower Need Assistance Dress Self Need Assistance Feed Self Need Assistance Walk Not Able Get In / Out Bed Need Assistance Housework Not Able Prepare Meals Not Able Handle Money Need Assistance Shop for Self Need Assistance Electronic Signature(s) Signed:  08/02/2018 4:45:05 PM By: Montey Hora Entered By: Montey Hora on 08/02/2018 13:34:18 [name], [name] ([id number]) -------------------------------------------------------------------------------- Education Screening Details Patient Name: [name], Cassandra M. Date of Service: 08/02/2018 1:15 PM Medical Record Number: [id number] Patient Account Number: [id number] Date of Birth/Sex: 24-Mar-1924 (85 y.o. F) Treating RN: Montey Hora Primary Care Tiannah [name]: Myrtie Hawk Other Clinician: Referring Samie Barclift: Myrtie Hawk Treating Jah Alarid/Extender: Melburn Hake, HOYT Weeks in Treatment: 0 Primary Learner Assessed: Caregiver caregiver Reason Patient is not Primary Learner: wound location Learning Preferences/Education Level/Primary Language Learning Preference: Explanation, Demonstration Highest Education Level: High School Preferred Language: English Cognitive Barrier Language Barrier: No Translator Needed: No Memory Deficit: No Emotional Barrier: No Cultural/Religious Beliefs Affecting Medical Care: No Physical Barrier Impaired Vision: No Impaired Hearing: No Decreased Hand dexterity: No Knowledge/Comprehension Knowledge Level: Medium Comprehension Level: Medium Ability to understand written Medium instructions: Ability to understand verbal Medium instructions: Motivation Anxiety Level: Calm Cooperation: Cooperative Education Importance: Acknowledges Need Interest in Health Problems: Asks Questions Perception: Coherent Willingness to Engage in Self- Medium Management Activities: Readiness to Engage in Self- Medium Management Activities: Electronic Signature(s) Signed: 08/02/2018 4:45:05 PM By: Montey Hora Entered By: Montey Hora on 08/02/2018 13:34:49 [name], [name] ([id number]) -------------------------------------------------------------------------------- Fall Risk Assessment Details Patient Name: [name],  Cassandra M. Date of Service: 08/02/2018 1:15 PM Medical Record Number: [id number] Patient Account Number: [id number] Date of Birth/Sex: [birth year] (85 y.o. F) Treating RN: Montey Hora Primary Care Tniya Bowditch: Myrtie Hawk Other Clinician: Referring Kempton Milne: Myrtie Hawk Treating Lurdes Haltiwanger/Extender: Melburn Hake, HOYT Weeks in Treatment: 0 Fall Risk Assessment Items Have you had 2 or more falls in the last 12 monthso 0 No Have you had any fall that resulted in injury in the last 12 monthso 0 No FALLS RISK SCREEN History of falling - immediate or within 3 months 0 No Secondary diagnosis (Do you have 2 or more medical diagnoseso) 0 No Ambulatory aid None/bed rest/wheelchair/nurse 0 No Crutches/cane/walker 15 Yes  Furniture 0 No Intravenous therapy Access/Saline/Heparin Lock 0 No Gait/Transferring Normal/ bed rest/ wheelchair 0 No Weak (short steps with or without shuffle, stooped but able to lift head while 10 Yes walking, may seek support from furniture) Impaired (short steps with shuffle, may have difficulty arising from chair, head 20 Yes down, impaired balance) Mental Status Oriented to own ability 0 Yes Electronic Signature(s) Signed: 08/02/2018 4:45:05 PM By: Montey Hora Entered By: Montey Hora on 08/02/2018 13:35:27 [name], [name] ([id number]) -------------------------------------------------------------------------------- Foot Assessment Details Patient Name: [name], Cassandra M. Date of Service: 08/02/2018 1:15 PM Medical Record Number: [id number] Patient Account Number: [id number] Date of Birth/Sex: 1924/10/06 (85 y.o. F) Treating RN: Montey Hora Primary Care Darcey Cardy: Myrtie Hawk Other Clinician: Referring Makira Holleman: Myrtie Hawk Treating Laasia Arcos/Extender: Melburn Hake, HOYT Weeks in Treatment: 0 Foot Assessment Items Site Locations + = Sensation present, - = Sensation absent, C = Callus, U = Ulcer R = Redness, W =  Warmth, M = Maceration, PU = Pre-ulcerative lesion F = Fissure, S = Swelling, D = Dryness Assessment Right: Left: Other Deformity: No No Prior Foot Ulcer: No No Prior Amputation: No No Charcot Joint: No No Ambulatory Status: Non-ambulatory Assistance Device: Wheelchair GaitEnergy manager) Signed: 08/02/2018 4:45:05 PM By: Montey Hora Entered By: Montey Hora on 08/02/2018 13:36:20 [name], [name] ([id number]) -------------------------------------------------------------------------------- Nutrition Risk Screening Details Patient Name: [name], Cassandra M. Date of Service: 08/02/2018 1:15 PM Medical Record Number: [id number] Patient Account Number: [id number] Date of Birth/Sex: 12-07-1924 (85 y.o. F) Treating RN: Montey Hora Primary Care Ashden Sonnenberg: Myrtie Hawk Other Clinician: Referring Jenavive Lamboy: Myrtie Hawk Treating Keyan Folson/Extender: Melburn Hake, HOYT Weeks in Treatment: 0 Height (in): Weight (lbs): 185 Body Mass Index (BMI): Nutrition Risk Screening Items Score Screening NUTRITION RISK SCREEN: I have an illness or condition that made me change the kind and/or amount of 0 No food I eat I eat fewer than two meals per day 0 No I eat few fruits and vegetables, or milk products 0 No I have three or more drinks of beer, liquor or wine almost every day 0 No I have tooth or mouth problems that make it hard for me to eat 0 No I don't always have enough money to buy the food I need 0 No I eat alone most of the time 0 No I take three or more different prescribed or over-the-counter drugs a day 1 Yes Without wanting to, I have lost or gained 10 pounds in the last six months 0 No I am not always physically able to shop, cook and/or feed myself 0 No Nutrition Protocols Good Risk Protocol 0 No interventions needed Moderate Risk Protocol High Risk Proctocol Risk Level: Good Risk Score: 1 Electronic Signature(s) Signed: 08/02/2018  4:45:05 PM By: Montey Hora Entered By: Montey Hora on 08/02/2018 13:35:35

## 2018-08-03 NOTE — Progress Notes (Signed)
Cassandra STEPHINE, LANGBEHN ([id number]) Visit Report for 08/02/2018 Allergy List Details Patient Name: [name], [name]. Date of Service: 08/02/2018 1:15 PM Medical Record Number: [id number] Patient Account Number: [id number] Date of Birth/Sex: March 31, 1924 (85 y.o. F) Treating RN: Montey Hora Primary Care Averlee Swartz: Myrtie Hawk Other Clinician: Referring Lucielle Vokes: Myrtie Hawk Treating Lashaun Poch/Extender: STONE III, HOYT Weeks in Treatment: 0 Allergies Active Allergies metformin Reaction: nausea Severity: Moderate Allergy Notes Electronic Signature(s) Signed: 08/02/2018 4:45:05 PM By: Montey Hora Entered By: Montey Hora on 08/02/2018 13:28:05 [name], [name] ([id number]) -------------------------------------------------------------------------------- Arrival Information Details Patient Name: [name], [name]. Date of Service: 08/02/2018 1:15 PM Medical Record Number: [id number] Patient Account Number: [id number] Date of Birth/Sex: Oct 15, 1924 (85 y.o. F) Treating RN: Montey Hora Primary Care Trevian Hayashida: Myrtie Hawk Other Clinician: Referring Macrae Wiegman: Myrtie Hawk Treating Ermine Stebbins/Extender: Melburn Hake, HOYT Weeks in Treatment: 0 Visit Information Patient Arrived: Wheel Chair Arrival Time: 13:23 Accompanied By: caregiver Transfer Assistance: Manual Patient Identification Verified: Yes Secondary Verification Process Completed: Yes Patient Has Alerts: Yes Patient Alerts: DMII History Since Last Visit Added or deleted any medications: No Any new allergies or adverse reactions: No Had a fall or experienced change in activities of daily living that may affect risk of falls: No Signs or symptoms of abuse/neglect since last visito No Hospitalized since last visit: No Implantable device outside of the clinic excluding cellular tissue based products placed in the center since last visit: No Has Dressing in Place as  Prescribed: Yes Electronic Signature(s) Signed: 08/02/2018 4:45:05 PM By: Montey Hora Entered By: Montey Hora on 08/02/2018 13:26:58 [name], [name] ([id number]) -------------------------------------------------------------------------------- Clinic Level of Care Assessment Details Patient Name: [name], [name]. Date of Service: 08/02/2018 1:15 PM Medical Record Number: [id number] Patient Account Number: [id number] Date of Birth/Sex: [birth year] (85 y.o. F) Treating RN: Cornell Barman Primary Care Ramsha Lonigro: Myrtie Hawk Other Clinician: Referring Alexsys Eskin: Myrtie Hawk Treating Jerrilynn Mikowski/Extender: Melburn Hake, HOYT Weeks in Treatment: 0 Clinic Level of Care Assessment Items TOOL 2 Quantity Score []  - Use when only an EandM is performed on the INITIAL visit 0 ASSESSMENTS - Nursing Assessment / Reassessment X - General Physical Exam (combine w/ comprehensive assessment (listed just below) when 1 20 performed on new pt. evals) X- 1 25 Comprehensive Assessment (HX, ROS, Risk Assessments, Wounds Hx, etc.) ASSESSMENTS - Wound and Skin Assessment / Reassessment X - Simple Wound Assessment / Reassessment - one wound 1 5 []  - 0 Complex Wound Assessment / Reassessment - multiple wounds []  - 0 Dermatologic / Skin Assessment (not related to wound area) ASSESSMENTS - Ostomy and/or Continence Assessment and Care []  - Incontinence Assessment and Management 0 []  - 0 Ostomy Care Assessment and Management (repouching, etc.) PROCESS - Coordination of Care X - Simple Patient / Family Education for ongoing care 1 15 []  - 0 Complex (extensive) Patient / Family Education for ongoing care []  - 0 Staff obtains Programmer, systems, Records, Test Results / Process Orders []  - 0 Staff telephones HHA, Nursing Homes / Clarify orders / etc []  - 0 Routine Transfer to another Facility (non-emergent condition) []  - 0 Routine Hospital Admission (non-emergent condition) X- 1 15 New  Admissions / Biomedical engineer / Ordering NPWT, Apligraf, etc. []  - 0 Emergency Hospital Admission (emergent condition) X- 1 10 Simple Discharge Coordination []  - 0 Complex (extensive) Discharge Coordination PROCESS - Special Needs []  - Pediatric / Minor Patient Management 0 []  - 0 Isolation Patient Management Cassandra KRINA, MRAZ. ([id number]) []  - 0 Hearing /  Language / Visual special needs []  - 0 Assessment of Community assistance (transportation, D/C planning, etc.) []  - 0 Additional assistance / Altered mentation []  - 0 Support Surface(s) Assessment (bed, cushion, seat, etc.) INTERVENTIONS - Wound Cleansing / Measurement X - Wound Imaging (photographs - any number of wounds) 1 5 []  - 0 Wound Tracing (instead of photographs) []  - 0 Simple Wound Measurement - one wound []  - 0 Complex Wound Measurement - multiple wounds []  - 0 Simple Wound Cleansing - one wound []  - 0 Complex Wound Cleansing - multiple wounds INTERVENTIONS - Wound Dressings []  - Small Wound Dressing one or multiple wounds 0 []  - 0 Medium Wound Dressing one or multiple wounds []  - 0 Large Wound Dressing one or multiple wounds []  - 0 Application of Medications - injection INTERVENTIONS - Miscellaneous []  - External ear exam 0 []  - 0 Specimen Collection (cultures, biopsies, blood, body fluids, etc.) []  - 0 Specimen(s) / Culture(s) sent or taken to Lab for analysis []  - 0 Patient Transfer (multiple staff / Civil Service fast streamer / Similar devices) []  - 0 Simple Staple / Suture removal (25 or less) []  - 0 Complex Staple / Suture removal (26 or more) []  - 0 Hypo / Hyperglycemic Management (close monitor of Blood Glucose) []  - 0 Ankle / Brachial Index (ABI) - do not check if billed separately Has the patient been seen at the hospital within the last three years: Yes Total Score: 95 Level Of Care: New/Established - Level 3 Electronic Signature(s) Signed: 08/02/2018 5:05:56 PM By: Gretta Cool, BSN, RN,  CWS, Kim RN, BSN Entered By: Gretta Cool, BSN, RN, CWS, Kim on 08/02/2018 14:05:46 [name], [name] ([id number]) -------------------------------------------------------------------------------- Encounter Discharge Information Details Patient Name: [name], [name]. Date of Service: 08/02/2018 1:15 PM Medical Record Number: [id number] Patient Account Number: [id number] Date of Birth/Sex: 1924-07-14 (85 y.o. F) Treating RN: Cornell Barman Primary Care Brazos Sandoval: Myrtie Hawk Other Clinician: Referring Kensley Valladares: Myrtie Hawk Treating Adrieanna Boteler/Extender: Melburn Hake, HOYT Weeks in Treatment: 0 Encounter Discharge Information Items Discharge Condition: Stable Ambulatory Status: Wheelchair Discharge Destination: Home Transportation: Private Auto Accompanied By: self Schedule Follow-up Appointment: Yes Clinical Summary of Care: Electronic Signature(s) Signed: 08/02/2018 5:05:56 PM By: Gretta Cool, BSN, RN, CWS, Kim RN, BSN Entered By: Gretta Cool, BSN, RN, CWS, Kim on 08/02/2018 14:07:29 [name], [name] ([id number]) -------------------------------------------------------------------------------- Lower Extremity Assessment Details Patient Name: [name], [name]. Date of Service: 08/02/2018 1:15 PM Medical Record Number: [id number] Patient Account Number: [id number] Date of Birth/Sex: 10-24-1924 (85 y.o. F) Treating RN: Montey Hora Primary Care Calyb Mcquarrie: Myrtie Hawk Other Clinician: Referring Layney Gillson: Myrtie Hawk Treating Kenzy Campoverde/Extender: Melburn Hake, HOYT Weeks in Treatment: 0 Edema Assessment Assessed: [Left: No] [Right: No] Edema: [Left: Yes] [Right: Yes] Calf Left: Right: Point of Measurement: 33 cm From Medial Instep 41.5 cm 40.5 cm Ankle Left: Right: Point of Measurement: 11 cm From Medial Instep 27.5 cm 27.5 cm Vascular Assessment Pulses: Dorsalis Pedis Palpable: [Left:Yes] [Right:Yes] Notes ABI AVVS [place] bilateral with TBI R  .70 L .98 Electronic Signature(s) Signed: 08/02/2018 4:45:05 PM By: Montey Hora Entered By: Montey Hora on 08/02/2018 13:39:26 [name], [name] ([id number]) -------------------------------------------------------------------------------- Multi Wound Chart Details Patient Name: [name], Ronnika Green. Date of Service: 08/02/2018 1:15 PM Medical Record Number: [id number] Patient Account Number: [id number] Date of Birth/Sex: 12/18/1924 (85 y.o. F) Treating RN: Cornell Barman Primary Care Martika Egler: Myrtie Hawk Other Clinician: Referring Clayten Allcock: Myrtie Hawk Treating Alyze Lauf/Extender: Melburn Hake, HOYT Weeks in Treatment: 0 Vital Signs Height(in): Pulse(bpm): 72 Weight(lbs):  185 Blood Pressure(mmHg): 146/74 Body Mass Index(BMI): Temperature(F): 98.9 Respiratory Rate 18 (breaths/min): Wound Assessments Treatment Notes Electronic Signature(s) Signed: 08/02/2018 5:05:56 PM By: Gretta Cool, BSN, RN, CWS, Kim RN, BSN Entered By: Gretta Cool, BSN, RN, CWS, Kim on 08/02/2018 13:51:36 [place], [name] ([id number]) -------------------------------------------------------------------------------- [place] Details Patient Name: [name], [name]. Date of Service: 08/02/2018 1:15 PM Medical Record Number: [id number] Patient Account Number: [id number] Date of Birth/Sex: 1924-07-10 (85 y.o. F) Treating RN: Cornell Barman Primary Care Ruby Dilone: Myrtie Hawk Other Clinician: Referring Ceniya Fowers: Myrtie Hawk Treating Welma Mccombs/Extender: Sharalyn Ink in Treatment: 0 Active Inactive Electronic Signature(s) Signed: 08/02/2018 5:05:56 PM By: Gretta Cool, BSN, RN, CWS, Kim RN, BSN Entered By: Gretta Cool, BSN, RN, CWS, Kim on 08/02/2018 13:51:10 [name], [name] ([id number]) -------------------------------------------------------------------------------- Non-Wound Condition Assessment Details Patient Name: [name],  [name]. Date of Service: 08/02/2018 1:15 PM Medical Record Number: [id number] Patient Account Number: [id number] Date of Birth/Sex: September 14, 1924 (85 y.o. F) Treating RN: Harold Barban Primary Care Bali Lyn: Myrtie Hawk Other Clinician: Referring Adriane Guglielmo: Myrtie Hawk Treating Joannie Medine/Extender: STONE III, HOYT Weeks in Treatment: 0 Non-Wound Condition: Condition: Suspected Deep Tissue Injury Location: Gluteus Side: Bilateral Photos Electronic Signature(s) Signed: 08/02/2018 4:49:02 PM By: Harold Barban Entered By: Harold Barban on 08/02/2018 16:42:03 [place], [name] ([id number]) -------------------------------------------------------------------------------- Pain Assessment Details Patient Name: [name], Eris Green. Date of Service: 08/02/2018 1:15 PM Medical Record Number: [id number] Patient Account Number: [id number] Date of Birth/Sex: 03/12/1924 (85 y.o. F) Treating RN: Cornell Barman Primary Care Dania Marsan: Myrtie Hawk Other Clinician: Referring Javel Hersh: Myrtie Hawk Treating Avien Taha/Extender: Melburn Hake, HOYT Weeks in Treatment: 0 Active Problems Location of Pain Severity and Description of Pain Patient Has Paino No Site Locations Pain Management and Medication Current Pain Management: Notes Patient states she does not have pain. Electronic Signature(s) Signed: 08/02/2018 5:05:56 PM By: Gretta Cool, BSN, RN, CWS, Kim RN, BSN Entered By: Gretta Cool, BSN, RN, CWS, Kim on 08/02/2018 13:50:52 [name] ([id number]) -------------------------------------------------------------------------------- Patient/Caregiver Education Details Patient Name: [name], Sekai Green. Date of Service: 08/02/2018 1:15 PM Medical Record Number: [id number] Patient Account Number: [id number] Date of Birth/Gender: 1924-04-04 (85 y.o. F) Treating RN: Cornell Barman Primary Care Physician: Myrtie Hawk Other Clinician: Referring  Physician: Myrtie Hawk Treating Physician/Extender: Sharalyn Ink in Treatment: 0 Education Assessment Education Provided To: Patient Education Topics Provided Basic Hygiene: Handouts: Other: daily moisturizing Methods: Demonstration, Explain/Verbal Responses: State content correctly Venous: Handouts: Controlling Swelling with Compression Stockings Methods: Demonstration, Explain/Verbal Responses: State content correctly Electronic Signature(s) Signed: 08/02/2018 5:05:56 PM By: Gretta Cool, BSN, RN, CWS, Kim RN, BSN Entered By: Gretta Cool, BSN, RN, CWS, Kim on 08/02/2018 14:06:36 [name], [name] ([id number]) -------------------------------------------------------------------------------- [place] Details Patient Name: [name], Laurencia Green. Date of Service: 08/02/2018 1:15 PM Medical Record Number: [id number] Patient Account Number: [id number] Date of Birth/Sex: Mar 16, 1924 (85 y.o. F) Treating RN: Montey Hora Primary Care Trevion Hoben: Myrtie Hawk Other Clinician: Referring Dera Vanaken: Myrtie Hawk Treating Johndaniel Catlin/Extender: Melburn Hake, HOYT Weeks in Treatment: 0 Vital Signs Time Taken: 13:27 Temperature (F): 98.9 Weight (lbs): 185 Pulse (bpm): 72 Source: Measured Respiratory Rate (breaths/min): 18 Blood Pressure (mmHg): 146/74 Reference Range: 80 - 120 mg / dl Electronic Signature(s) Signed: 08/02/2018 4:45:05 PM By: Montey Hora Entered By: Montey Hora on 08/02/2018 13:27:52

## 2018-08-03 NOTE — Progress Notes (Signed)
[place] ([id number]) Visit Report for 08/02/2018 Chief Complaint Document Details Patient Name: [name]. Date of Service: 08/02/2018 1:15 PM Medical Record Number: [id number] Patient Account Number: [id number] Date of Birth/Sex: [birth year] (85 y.o. F) Treating RN: Cornell Barman Primary Care Provider: Myrtie Hawk Other Clinician: Referring Provider: Myrtie Hawk Treating Provider/Extender: Melburn Hake, Ajanae Virag Weeks in Treatment: 0 Information Obtained from: Patient Chief Complaint Bilateral LE lymphedema Electronic Signature(s) Signed: 08/03/2018 4:38:37 PM By: Worthy Keeler PA-C Entered By: Worthy Keeler on 08/02/2018 14:12:57 [name], [name] ([id number]) -------------------------------------------------------------------------------- HPI Details Patient Name: [name], Cassandra M. Date of Service: 08/02/2018 1:15 PM Medical Record Number: [id number] Patient Account Number: [id number] Date of Birth/Sex: [birth year] (85 y.o. F) Treating RN: Cornell Barman Primary Care Provider: Myrtie Hawk Other Clinician: Referring Provider: Myrtie Hawk Treating Provider/Extender: Melburn Hake, Lumir Demetriou Weeks in Treatment: 0 History of Present Illness HPI Description: 04/17/18 on evaluation today patient presents for initial inspection in our clinic concerning issues that she has been having with her gluteal region. Things actually appear to be doing much better at this point although we could go her daughter tells me that the patient was actually having areas that were draining in the gluteal region bilaterally. Fortunately these regions appear to have resolved. Still there is evidence of pressure injury to the gluteal region she has blanchable erythema but decreased capillary refill to the area which again is an early sign of pressure. Again I do think that her daily activity may be the cause of such. She does have a history of  diabetes with a hemoglobin A1c of 7.8 on 04/11/18. She also has gout and hypertension. The patient does get around in her home according to what her daughter tells me. However she spends most of her day and actually her night as well in her recliner which I think is likely where the pressure is coming from. She tells me that she doesn't sleep in a bed and pretty much unless she's getting up to use the bathroom or eat she is in her recliner. This again I think is likely the biggest issue that we're seeing right now for what's causing the pressure to her gluteal region. 08/02/18 on evaluation today patient actually presents for reevaluation in clinic concerning issues that she's been having with her gluteal region which the good news is appears to be completely healed at this time. There's no open wound currently. She did have bilateral lower extremity lymphedema which was starting to week more on the right lower extremity especially. With that being said this seems to be also doing better in resolving she's not wearing the compression stockings of I think she really is that compression stockings pretty much all the time. She does have TBI's that were performed and finalized on 04/30/18 they appear to be doing well with a right TBI of 0.70 in the left TBI of 0.98. Overall I feel like the patient is actually doing quite well although we do need to have some things that we go through with them as far as teaching is concerned in order to prevent issues from showing up in the future. No fevers, chills, nausea, or vomiting noted at this time. Electronic Signature(s) Signed: 08/03/2018 4:38:37 PM By: Worthy Keeler PA-C Entered By: Worthy Keeler on 08/02/2018 14:18:44 [place] ([id number]) -------------------------------------------------------------------------------- Physical Exam Details Patient Name: [name], Cassandra M. Date of Service: 08/02/2018 1:15 PM Medical Record Number:  [id number] Patient Account Number:  [id number] Date of Birth/Sex: 1924-06-19 (85 y.o. F) Treating RN: Cornell Barman Primary Care Provider: Myrtie Hawk Other Clinician: Referring Provider: Myrtie Hawk Treating Provider/Extender: Melburn Hake, Layna Roeper Weeks in Treatment: 0 Constitutional patient is hypertensive.. pulse regular and within target range for patient.Marland Kitchen respirations regular, non-labored and within target range for patient.Marland Kitchen temperature within target range for patient.. Well-nourished and well-hydrated in no acute distress. Eyes conjunctiva clear no eyelid edema noted. pupils equal round and reactive to light and accommodation. Ears, Nose, Mouth, and Throat no gross abnormality of ear auricles or external auditory canals. normal hearing noted during conversation. mucus membranes moist. Respiratory normal breathing without difficulty. clear to auscultation bilaterally. Cardiovascular regular rate and rhythm with normal S1, S2. 1+ dorsalis pedis/posterior tibialis pulses. Stage 2 lymphedema bilateral. Gastrointestinal (GI) soft, non-tender, non-distended, +BS. no ventral hernia noted. Musculoskeletal Patient unable to walk without assistance. no significant deformity or arthritic changes, no loss or range of motion, no clubbing. Psychiatric this patient is able to make decisions and demonstrates good insight into disease process. Alert and Oriented x 3. pleasant and cooperative. Notes Patient has no open wounds at this time which is good news. She does have bilateral lower extremity lymphedema unfortunately in this is something that definitely I think needs to be treated with compression stockings on a regular basis. We discussed that today and actually measured her legs as well as gave the information for elastic therapy so that they can order these. Electronic Signature(s) Signed: 08/03/2018 4:38:37 PM By: Worthy Keeler PA-C Entered By: Worthy Keeler on 08/02/2018  14:19:32 [name], [name] ([id number]) -------------------------------------------------------------------------------- Physician Orders Details Patient Name: [name], Cassandra M. Date of Service: 08/02/2018 1:15 PM Medical Record Number: [id number] Patient Account Number: [id number] Date of Birth/Sex: [birth year] (85 y.o. F) Treating RN: Cornell Barman Primary Care Provider: Myrtie Hawk Other Clinician: Referring Provider: Myrtie Hawk Treating Provider/Extender: Melburn Hake, Merilee Wible Weeks in Treatment: 0 Verbal / Phone Orders: No Diagnosis Coding ICD-10 Coding Code Description I89.0 Lymphedema, not elsewhere classified I10 Essential (primary) hypertension Skin Barriers/Peri-Wound Care o Moisturizing lotion - Eucerin cream in a jar to moisturizer legs daily. Follow-up Appointments o Other: - Only if needed. Edema Control o Patient to wear own compression stockings - Wear daily. Measurements given to caregiver. Off-Loading o Turn and reposition every 2 hours o Other: - Try different positions in the recliner and on the couch or bed on your side. Electronic Signature(s) Signed: 08/02/2018 5:05:56 PM By: Gretta Cool, BSN, RN, CWS, Kim RN, BSN Signed: 08/03/2018 4:38:37 PM By: Worthy Keeler PA-C Entered By: Gretta Cool BSN, RN, CWS, Kim on 08/02/2018 14:04:37 [place], [name] ([id number]) -------------------------------------------------------------------------------- Problem List Details Patient Name: [name], Ridley M. Date of Service: 08/02/2018 1:15 PM Medical Record Number: [id number] Patient Account Number: [id number] Date of Birth/Sex: 1924/06/08 (85 y.o. F) Treating RN: Cornell Barman Primary Care Provider: Myrtie Hawk Other Clinician: Referring Provider: Myrtie Hawk Treating Provider/Extender: Melburn Hake, Romero Letizia Weeks in Treatment: 0 Active Problems ICD-10 Evaluated Encounter Code Description Active Date Today  Diagnosis I89.0 Lymphedema, not elsewhere classified 08/02/2018 No Yes E11.622 Type 2 diabetes mellitus with other skin ulcer 08/02/2018 No Yes R53.1 Weakness 08/02/2018 No Yes Inactive Problems Resolved Problems Electronic Signature(s) Signed: 08/03/2018 4:38:37 PM By: Worthy Keeler PA-C Entered By: Worthy Keeler on 08/02/2018 14:13:47 [place] ([id number]) -------------------------------------------------------------------------------- Progress Note Details Patient Name: [name], Soliyana M. Date of Service: 08/02/2018 1:15 PM Medical Record Number: [id number] Patient Account Number: [id number] Date of  Birth/Sex: [birth year] (85 y.o. F) Treating RN: Cornell Barman Primary Care Provider: Myrtie Hawk Other Clinician: Referring Provider: Myrtie Hawk Treating Provider/Extender: Melburn Hake, Alfons Sulkowski Weeks in Treatment: 0 Subjective Chief Complaint Information obtained from Patient Bilateral LE lymphedema History of Present Illness (HPI) 04/17/18 on evaluation today patient presents for initial inspection in our clinic concerning issues that she has been having with her gluteal region. Things actually appear to be doing much better at this point although we could go her daughter tells me that the patient was actually having areas that were draining in the gluteal region bilaterally. Fortunately these regions appear to have resolved. Still there is evidence of pressure injury to the gluteal region she has blanchable erythema but decreased capillary refill to the area which again is an early sign of pressure. Again I do think that her daily activity may be the cause of such. She does have a history of diabetes with a hemoglobin A1c of 7.8 on 04/11/18. She also has gout and hypertension. The patient does get around in her home according to what her daughter tells me. However she spends most of her day and actually her night as well in her recliner which I think is  likely where the pressure is coming from. She tells me that she doesn't sleep in a bed and pretty much unless she's getting up to use the bathroom or eat she is in her recliner. This again I think is likely the biggest issue that we're seeing right now for what's causing the pressure to her gluteal region. 08/02/18 on evaluation today patient actually presents for reevaluation in clinic concerning issues that she's been having with her gluteal region which the good news is appears to be completely healed at this time. There's no open wound currently. She did have bilateral lower extremity lymphedema which was starting to week more on the right lower extremity especially. With that being said this seems to be also doing better in resolving she's not wearing the compression stockings of I think she really is that compression stockings pretty much all the time. She does have TBI's that were performed and finalized on 04/30/18 they appear to be doing well with a right TBI of 0.70 in the left TBI of 0.98. Overall I feel like the patient is actually doing quite well although we do need to have some things that we go through with them as far as teaching is concerned in order to prevent issues from showing up in the future. No fevers, chills, nausea, or vomiting noted at this time. Patient History Information obtained from Patient. Allergies metformin (Severity: Moderate, Reaction: nausea) Family History Cancer - Child, Diabetes - Siblings, Heart Disease - Father,Child, Hypertension - Child, Thyroid Problems - Siblings, No family history of Kidney Disease, Lung Disease, Seizures, Tuberculosis. Social History Never smoker, Marital Status - Widowed, Alcohol Use - Never, Drug Use - No History, Caffeine Use - Moderate. Medical History Eyes Patient has history of Cataracts - removed, Glaucoma Denies history of Optic Neuritis Ear/Nose/Mouth/Throat Denies history of Chronic sinus problems/congestion, Middle  ear problems SHOFFNER BRAIDEN, PRESUTTI ([id number]) Hematologic/Lymphatic Denies history of Anemia, Hemophilia, Human Immunodeficiency Virus, Lymphedema, Sickle Cell Disease Respiratory Denies history of Aspiration, Asthma, Chronic Obstructive Pulmonary Disease (COPD), Pneumothorax, Sleep Apnea, Tuberculosis Cardiovascular Patient has history of Arrhythmia, Congestive Heart Failure, Coronary Artery Disease, Hypertension Denies history of Angina, Hypotension, Myocardial Infarction, Peripheral Arterial Disease, Peripheral Venous Disease, Phlebitis, Vasculitis Gastrointestinal Denies history of Cirrhosis , Colitis, Crohn s, Hepatitis A,  Hepatitis B, Hepatitis C Endocrine Patient has history of Type II Diabetes Denies history of Type I Diabetes Genitourinary Denies history of End Stage Renal Disease Immunological Denies history of Lupus Erythematosus, Raynaud s, Scleroderma Integumentary (Skin) Denies history of History of Burn, History of pressure wounds Musculoskeletal Patient has history of Gout, Osteoarthritis - bilateral knees Denies history of Rheumatoid Arthritis, Osteomyelitis Neurologic Denies history of Dementia, Neuropathy, Quadriplegia, Paraplegia, Seizure Disorder Oncologic Denies history of Received Chemotherapy, Received Radiation Psychiatric Denies history of Anorexia/bulimia, Confinement Anxiety Hospitalization/Surgery History - Heart valve replacement. Review of Systems (ROS) Eyes Denies complaints or symptoms of Dry Eyes, Vision Changes, Glasses / Contacts. Ear/Nose/Mouth/Throat Denies complaints or symptoms of Difficult clearing ears, Sinusitis. Hematologic/Lymphatic Denies complaints or symptoms of Bleeding / Clotting Disorders, Human Immunodeficiency Virus. Respiratory Denies complaints or symptoms of Chronic or frequent coughs, Shortness of Breath. Cardiovascular Denies complaints or symptoms of Chest pain, LE edema. Gastrointestinal Denies complaints or  symptoms of Frequent diarrhea, Nausea, Vomiting. Endocrine Denies complaints or symptoms of Hepatitis, Thyroid disease, Polydypsia (Excessive Thirst). Genitourinary Complains or has symptoms of Incontinence/dribbling. Denies complaints or symptoms of Kidney failure/ Dialysis. Immunological Denies complaints or symptoms of Hives, Itching. Integumentary (Skin) Complains or has symptoms of Wounds, Swelling. Denies complaints or symptoms of Bleeding or bruising tendency, Breakdown. Musculoskeletal Denies complaints or symptoms of Muscle Pain, Muscle Weakness. Neurologic Denies complaints or symptoms of Numbness/parasthesias, Focal/Weakness. Psychiatric [place] ([id number]) Denies complaints or symptoms of Anxiety, Claustrophobia. Objective Constitutional patient is hypertensive.. pulse regular and within target range for patient.Marland Kitchen respirations regular, non-labored and within target range for patient.Marland Kitchen temperature within target range for patient.. Well-nourished and well-hydrated in no acute distress. Vitals Time Taken: 1:27 PM, Weight: 185 lbs, Source: Measured, Temperature: 98.9 F, Pulse: 72 bpm, Respiratory Rate: 18 breaths/min, Blood Pressure: 146/74 mmHg. Eyes conjunctiva clear no eyelid edema noted. pupils equal round and reactive to light and accommodation. Ears, Nose, Mouth, and Throat no gross abnormality of ear auricles or external auditory canals. normal hearing noted during conversation. mucus membranes moist. Respiratory normal breathing without difficulty. clear to auscultation bilaterally. Cardiovascular regular rate and rhythm with normal S1, S2. 1+ dorsalis pedis/posterior tibialis pulses. Stage 2 lymphedema bilateral. Gastrointestinal (GI) soft, non-tender, non-distended, +BS. no ventral hernia noted. Musculoskeletal Patient unable to walk without assistance. no significant deformity or arthritic changes, no loss or range of motion,  no clubbing. Psychiatric this patient is able to make decisions and demonstrates good insight into disease process. Alert and Oriented x 3. pleasant and cooperative. General Notes: Patient has no open wounds at this time which is good news. She does have bilateral lower extremity lymphedema unfortunately in this is something that definitely I think needs to be treated with compression stockings on a regular basis. We discussed that today and actually measured her legs as well as gave the information for elastic therapy so that they can order these. Assessment Active Problems ICD-10 SHOFFNER LILLYAN, HITSON. ([id number]) Lymphedema, not elsewhere classified Type 2 diabetes mellitus with other skin ulcer Weakness Plan Skin Barriers/Peri-Wound Care: Moisturizing lotion - Eucerin cream in a jar to moisturizer legs daily. Follow-up Appointments: Other: - Only if needed. Edema Control: Patient to wear own compression stockings - Wear daily. Measurements given to caregiver. Off-Loading: Turn and reposition every 2 hours Other: - Try different positions in the recliner and on the couch or bed on your side. I had an extensive conversation with the patient and her caregiver today as well as her daughter on  the phone concerning what we need to do to prevent issues from occurring in the future. I think that the patient needs to have compression therapy at all times also think she needs to work on repositioning so that she can be in different areas such as possibly encounter the post just always in her recliner which is gonna cause her bottom to break open because of the issues as well. Obviously that's not what we want. Will subtly see were things stand at follow-up as needed in the future thinking changes or worsens. Otherwise we were to see her back as needed. Electronic Signature(s) Signed: 08/03/2018 4:38:37 PM By: Worthy Keeler PA-C Entered By: Worthy Keeler on 08/02/2018  14:20:18 [name], [name] ([id number]) -------------------------------------------------------------------------------- ROS/PFSH Details Patient Name: [name], Carlynn M. Date of Service: 08/02/2018 1:15 PM Medical Record Number: [id number] Patient Account Number: [id number] Date of Birth/Sex: 1924/03/11 (85 y.o. F) Treating RN: Montey Hora Primary Care Provider: Myrtie Hawk Other Clinician: Referring Provider: Myrtie Hawk Treating Provider/Extender: Melburn Hake, Tavious Griesinger Weeks in Treatment: 0 Information Obtained From Patient Eyes Complaints and Symptoms: Negative for: Dry Eyes; Vision Changes; Glasses / Contacts Medical History: Positive for: Cataracts - removed; Glaucoma Negative for: Optic Neuritis Ear/Nose/Mouth/Throat Complaints and Symptoms: Negative for: Difficult clearing ears; Sinusitis Medical History: Negative for: Chronic sinus problems/congestion; Middle ear problems Hematologic/Lymphatic Complaints and Symptoms: Negative for: Bleeding / Clotting Disorders; Human Immunodeficiency Virus Medical History: Negative for: Anemia; Hemophilia; Human Immunodeficiency Virus; Lymphedema; Sickle Cell Disease Respiratory Complaints and Symptoms: Negative for: Chronic or frequent coughs; Shortness of Breath Medical History: Negative for: Aspiration; Asthma; Chronic Obstructive Pulmonary Disease (COPD); Pneumothorax; Sleep Apnea; Tuberculosis Cardiovascular Complaints and Symptoms: Negative for: Chest pain; LE edema Medical History: Positive for: Arrhythmia; Congestive Heart Failure; Coronary Artery Disease; Hypertension Negative for: Angina; Hypotension; Myocardial Infarction; Peripheral Arterial Disease; Peripheral Venous Disease; Phlebitis; Vasculitis Gastrointestinal Complaints and Symptoms: Negative for: Frequent diarrhea; Nausea; Vomiting SHOFFNER SALONI, LABLANC. ([id number]) Medical History: Negative for: Cirrhosis ; Colitis;  Crohnos; Hepatitis A; Hepatitis B; Hepatitis C Endocrine Complaints and Symptoms: Negative for: Hepatitis; Thyroid disease; Polydypsia (Excessive Thirst) Medical History: Positive for: Type II Diabetes Negative for: Type I Diabetes Time with diabetes: 20 years Treated with: Oral agents Blood sugar tested every day: Yes Tested : twice daily Genitourinary Complaints and Symptoms: Positive for: Incontinence/dribbling Negative for: Kidney failure/ Dialysis Medical History: Negative for: End Stage Renal Disease Immunological Complaints and Symptoms: Negative for: Hives; Itching Medical History: Negative for: Lupus Erythematosus; Raynaudos; Scleroderma Integumentary (Skin) Complaints and Symptoms: Positive for: Wounds; Swelling Negative for: Bleeding or bruising tendency; Breakdown Medical History: Negative for: History of Burn; History of pressure wounds Musculoskeletal Complaints and Symptoms: Negative for: Muscle Pain; Muscle Weakness Medical History: Positive for: Gout; Osteoarthritis - bilateral knees Negative for: Rheumatoid Arthritis; Osteomyelitis Neurologic Complaints and Symptoms: Negative for: Numbness/parasthesias; Focal/Weakness Medical History: Negative for: Dementia; Neuropathy; Quadriplegia; Paraplegia; Seizure Disorder SHOFFNER ARBELL, [place]CO[place] ([id number]) Psychiatric Complaints and Symptoms: Negative for: Anxiety; Claustrophobia Medical History: Negative for: Anorexia/bulimia; Confinement Anxiety Oncologic Medical History: Negative for: Received Chemotherapy; Received Radiation HBO Extended History Items Eyes: Eyes: Cataracts Glaucoma Immunizations Pneumococcal Vaccine: Received Pneumococcal Vaccination: Yes Implantable Devices None Hospitalization / Surgery History Type of Hospitalization/Surgery Heart valve replacement Family and Social History Cancer: Yes - Child; Diabetes: Yes - Siblings; Heart Disease: Yes - Father,Child; Hypertension: Yes  - Child; Kidney Disease: No; Lung Disease: No; Seizures: No; Thyroid Problems: Yes - Siblings; Tuberculosis: No; Never smoker; Marital Status - Widowed; Alcohol  Use: Never; Drug Use: No History; Caffeine Use: Moderate Electronic Signature(s) Signed: 08/02/2018 4:45:05 PM By: Montey Hora Signed: 08/03/2018 4:38:37 PM By: Worthy Keeler PA-C Entered By: Montey Hora on 08/02/2018 13:30:13 [name], [name] ([id number]) -------------------------------------------------------------------------------- SuperBill Details Patient Name: [name], Der M. Date of Service: 08/02/2018 Medical Record Number: [id number] Patient Account Number: [id number] Date of Birth/Sex: 1924/09/12 (85 y.o. F) Treating RN: Cornell Barman Primary Care Provider: Myrtie Hawk Other Clinician: Referring Provider: Myrtie Hawk Treating Provider/Extender: Melburn Hake, Deborra Phegley Weeks in Treatment: 0 Diagnosis Coding ICD-10 Codes Code Description I89.0 Lymphedema, not elsewhere classified E11.622 Type 2 diabetes mellitus with other skin ulcer R53.1 Weakness Facility Procedures CPT4 Code: [id number] Description: 99213 - WOUND CARE VISIT-LEV 3 EST PT Modifier: Quantity: 1 Physician Procedures CPT4 Code: 1761607 Description: 37106 - WC PHYS LEVEL 4 - EST PT ICD-10 Diagnosis Description I89.0 Lymphedema, not elsewhere classified E11.622 Type 2 diabetes mellitus with other skin ulcer R53.1 Weakness Modifier: Quantity: 1 Electronic Signature(s) Signed: 08/03/2018 4:38:37 PM By: Worthy Keeler PA-C Entered By: Worthy Keeler on 08/02/2018 14:20:40

## 2018-08-14 ENCOUNTER — Other Ambulatory Visit: Payer: Self-pay

## 2018-08-14 ENCOUNTER — Ambulatory Visit (INDEPENDENT_AMBULATORY_CARE_PROVIDER_SITE_OTHER): Payer: PPO | Admitting: Internal Medicine

## 2018-08-14 DIAGNOSIS — D649 Anemia, unspecified: Secondary | ICD-10-CM

## 2018-08-14 DIAGNOSIS — M1712 Unilateral primary osteoarthritis, left knee: Secondary | ICD-10-CM

## 2018-08-14 DIAGNOSIS — M109 Gout, unspecified: Secondary | ICD-10-CM

## 2018-08-14 DIAGNOSIS — E1165 Type 2 diabetes mellitus with hyperglycemia: Secondary | ICD-10-CM | POA: Diagnosis not present

## 2018-08-14 DIAGNOSIS — R600 Localized edema: Secondary | ICD-10-CM

## 2018-08-14 DIAGNOSIS — I890 Lymphedema, not elsewhere classified: Secondary | ICD-10-CM | POA: Diagnosis not present

## 2018-08-14 DIAGNOSIS — I10 Essential (primary) hypertension: Secondary | ICD-10-CM

## 2018-08-14 NOTE — Progress Notes (Signed)
telephone Note  I connected with [name]  on 08/14/18 at  9:10 AM EDT by a telephone and verified that I am speaking with the correct person using two identifiers.  Location patient: home Location provider:work or home office Persons participating in the virtual visit: patient, provider, pts daughter [name]   I discussed the limitations of evaluation and management by telemedicine and the availability of in person appointments. The patient expressed understanding and agreed to proceed.   HPI: 1. HTN BP 157/89 with exertion and 116/65 range from 120s-140s/60s-80s on current meds  2. Chronic leg edema daughter she gave her an extra dose of torsemide 30 mg yesterday current weight is 189.6 lbs. She does have compression stockings which she is wearing  3. C/o discoloration to b/l legs and ABI 04/2018 not conclusive appt with [facility] 08/23/18 due to chronic lymphedema she currently does not have lymph pumps 4. Wound to buttocks saw wound clinic and improved last seen 2 weeks ago  5. DM 2 A1C 7.8 04/11/18 on current meds pending eye exam  6. Left knee pain with walking Xray 03/2018 mild to moderate OA, chrondrocalcinosis only painful with walking declines to see ortho currently   ROS: See pertinent positives and negatives per HPI.  Past Medical History:  Diagnosis Date  . Anemia   . Carotid arterial disease ([place])    a. 08/2014 U/S: Bilateral < 50% stenosis. Patent vertebrals w/ antegrade flow.   . Cervical spine arthritis   . Chronic diastolic CHF (congestive heart failure) ([place])    a. echo 2014: EF 55-60%, DD;  b. 08/2014 Echo: EF 55-60%, no RWMA, GR1DD; c. 02/2016 Echo: EF 65-70%, Gr1 DD.  Marland Kitchen Cognitive impairment   . Depression   . Diabetes mellitus without complication ([place])   . Essential hypertension   . GERD (gastroesophageal reflux disease)   . Glaucoma   . Gout   . History of renal impairment   . Hyperlipidemia   . Hypotension    a. Related to Norvasc - 11/2014 ED visit.  .  Multinodular goiter    a. Noted incidentally on CT 07/2012 and carotid U/S 08/2014;  b. Nl TSH 11/2014.  Marland Kitchen Paroxysmal SVT (supraventricular tachycardia) (HCC)    a. on Toprol   . Prolapsed uterus   . Severe aortic stenosis    a. 11/15/16: s/p TAVR Edwards Sapien 3 THV (size 23 mm, model # 9600TFX, serial # [id number])  . Stroke [facility])    a. CT head 07/2014 showed small thalamic infarct  . Vitamin deficiency     Past Surgical History:  Procedure Laterality Date  . EYE SURGERY     unsure of what procedure, did know that laser was used  . IR RADIOLOGY PERIPHERAL GUIDED IV START  11/03/2016  . IR US GUIDE VASC ACCESS RIGHT  11/03/2016  . RIGHT/LEFT HEART CATH AND CORONARY ANGIOGRAPHY N/A 10/26/2016   Procedure: RIGHT/LEFT HEART CATH AND CORONARY ANGIOGRAPHY;  Surgeon: [name], MD;  Location: [facility];  Service: Cardiovascular;  Laterality: N/A;  . TEE WITHOUT CARDIOVERSION N/A 03/31/2016   Procedure: TRANSESOPHAGEAL ECHOCARDIOGRAM (TEE);  Surgeon: Minna Merritts, MD;  Location: [facility];  Service: Cardiovascular;  Laterality: N/A;  . TEE WITHOUT CARDIOVERSION N/A 11/15/2016   Procedure: TRANSESOPHAGEAL ECHOCARDIOGRAM (TEE);  Surgeon: [name], MD;  Location: [place];  Service: Open Heart Surgery;  Laterality: N/A;  . TRANSCATHETER AORTIC VALVE REPLACEMENT, TRANSFEMORAL N/A 11/15/2016   Procedure: TRANSCATHETER AORTIC VALVE REPLACEMENT, TRANSFEMORAL;  Surgeon: [name],  MD;  [facility];  Service: Open Heart Surgery;  Laterality: N/A;  . TUBAL LIGATION      Family History  Problem Relation Age of Onset  . Glaucoma Mother   . Diabetes Mother   . Peptic Ulcer Father   . Colitis Father     SOCIAL HX: lives at home daughter is caretaker [name]    Current Outpatient Medications:  .  ACCU-CHEK AVIVA PLUS test strip, USE 1 STRIP TO TEST BLOOD SUGARS 3 TIMES A DAY, Disp: 300 each, Rfl: 4 .  acetaminophen (TYLENOL) 500 MG tablet, Take 1 tablet (500 mg total) by mouth  every 6 (six) hours as needed for mild pain or headache., Disp: 360 tablet, Rfl: 3 .  allopurinol (ZYLOPRIM) 100 MG tablet, Take 1 tablet (100 mg total) by mouth daily., Disp: 90 tablet, Rfl: 3 .  aspirin 81 MG chewable tablet, Chew 1 tablet (81 mg total) by mouth daily., Disp: 90 tablet, Rfl: 3 .  blood glucose meter kit and supplies, Dispense based on patient and insurance preference. Use 1x daily as directed. (FOR ICD-10 E10.9, E11.9)., Disp: 1 each, Rfl: 0 .  brimonidine (ALPHAGAN P) 0.1 % SOLN, Place 1 drop into the right eye 2 (two) times daily., Disp: , Rfl:  .  Carboxymethylcellulose Sodium (ARTIFICIAL TEARS OP), Apply 1 drop to eye 2 (two) times daily. Left eye, Disp: , Rfl:  .  cholecalciferol (VITAMIN D) 25 MCG (1000 UT) tablet, Take 1 tablet (1,000 Units total) by mouth daily., Disp: 90 tablet, Rfl: 3 .  clotrimazole (LOTRIMIN) 1 % cream, APPLY TOPICALLY TO THE AFFECTED AREA ONCE A DAY AT BEDTIME FOR 7-14 DAYS, Disp: 60 g, Rfl: 11 .  dimethicone (PROSHIELD PLUS SKIN PROTECTANT) 1 % cream, 1 application to the buttock 2 times daily to help protect skin, Disp: 114 g, Rfl: 11 .  Dorzolamide HCl-Timolol Mal (COSOPT OP), Place 1 drop into the right eye 2 (two) times daily. , Disp: , Rfl:  .  feeding supplement, ENSURE ENLIVE, (ENSURE ENLIVE) LIQD, Take 237 mLs by mouth 2 (two) times daily between meals., Disp: 237 mL, Rfl: 12 .  fluticasone (FLONASE) 50 MCG/ACT nasal spray, Place 1 spray into both nostrils daily. Prn max 2 sprays total each nose, Disp: 16 g, Rfl: 11 .  gabapentin (NEURONTIN) 300 MG capsule, Take 1 capsule (300 mg total) by mouth 2 (two) times daily., Disp: 180 capsule, Rfl: 3 .  glucose blood test strip, Accu-Chek Aviva Plus test strips, Disp: 300 each, Rfl: 4 .  iron polysaccharides (NIFEREX) 150 MG capsule, Take 1 capsule (150 mg total) by mouth daily. Note reduction in frequency from 2x to 1 x per day (Patient taking differently: Take 150 mg by mouth daily. ), Disp: 90  capsule, Rfl: 3 .  latanoprost (XALATAN) 0.005 % ophthalmic solution, Place 1 drop into the right eye at bedtime. , Disp: , Rfl:  .  lovastatin (MEVACOR) 40 MG tablet, Take 1 tablet (40 mg total) by mouth at bedtime., Disp: 90 tablet, Rfl: 3 .  metoprolol succinate (TOPROL-XL) 25 MG 24 hr tablet, Take 1 tablet (25 mg total) by mouth daily., Disp: 90 tablet, Rfl: 3 .  Multiple Vitamin (MULTI-VITAMIN PO), Take 1 tablet by mouth daily., Disp: , Rfl:  .  mupirocin ointment (BACTROBAN) 2 %, APPLY 1 APPLICATION INTO NOSE 2 TIMES DAILY, Disp: 22 g, Rfl: 0 .  mupirocin ointment (BACTROBAN) 2 %, Apply 1 application topically 2 (two) times daily. To open sores as  needed, Disp: 30 g, Rfl: 2 .  Nutritional Supplements (BOOST GLUCOSE CONTROL PO), Take 1 Container by mouth daily., Disp: , Rfl:  .  ONETOUCH DELICA LANCETS 76E MISC, USE TO TEST BLOOD SUGAR ONCE A DAY, Disp: , Rfl: 0 .  pantoprazole (PROTONIX) 40 MG tablet, Take 1 tablet (40 mg total) by mouth daily. 30 minutes before breakfast OR dinner, Disp: 90 tablet, Rfl: 3 .  pilocarpine (PILOCAR) 4 % ophthalmic solution, Place 1 drop into the right eye 4 (four) times daily. , Disp: , Rfl:  .  polyethylene glycol (MIRALAX) packet, Take 17 g by mouth daily. prn, Disp: 30 each, Rfl: 11 .  potassium chloride (K-DUR) 10 MEQ tablet, Take 1 tablet (10 mEq total) by mouth daily. With fluid pill, Disp: 30 tablet, Rfl: 3 .  senna-docusate (SENOKOT-S) 8.6-50 MG tablet, Take 2 tablets by mouth daily as needed for mild constipation. , Disp: , Rfl:  .  sertraline (ZOLOFT) 50 MG tablet, Take 1 tablet (50 mg total) by mouth daily., Disp: 90 tablet, Rfl: 3 .  sitaGLIPtin (JANUVIA) 50 MG tablet, Take 1 tablet (50 mg total) by mouth daily., Disp: 90 tablet, Rfl: 3 .  torsemide (DEMADEX) 20 MG tablet, Take 1.5 tablets (30 mg total) by mouth daily., Disp: 135 tablet, Rfl: 3 .  traZODone (DESYREL) 100 MG tablet, Take 0.5-1 tablets (50-100 mg total) by mouth at bedtime as needed.,  Disp: 90 tablet, Rfl: 3 .  vitamin C (ASCORBIC ACID) 500 MG tablet, Take 1 tablet (500 mg total) by mouth daily., Disp: 90 tablet, Rfl: 3 .  Zinc Oxide 10 % OINT, Apply 1 application topically daily as needed (rash). Applied to patient's bottom, Disp: 226.8 g, Rfl: 11  EXAM:  VITALS per patient if applicable:  GENERAL: alert, oriented, appears well and in no acute distress  PSYCH/NEURO: pleasant and cooperative, no obvious depression or anxiety, speech and thought processing grossly intact  ASSESSMENT AND PLAN:  Discussed the following assessment and plan:  Type 2 diabetes mellitus with hyperglycemia, without long-term current use of insulin ([place]) - Plan: Comprehensive metabolic panel, CBC with Differential/Platelet, Hemoglobin A1c 10/10/2018 fasting Fax eye exam when able  Anemia, unspecified type - Plan: CBC with Differential/Platelet, sch 10/10/2018   Gout, unspecified cause, unspecified chronicity, unspecified site - Plan: Uric acid, cont meds   Essential hypertension - Plan: cont meds   Lower extremity edema  Lymphedema -compression stockings, pending lymph pump Daughter c/w leg discoloration will disc with [facility] 08/23/2018    Arthritis of left knee - Plan: Rx knee brace   HM Flu shot utd  Consider pna 23 and Tdap in future  Rx shingrix in the past  prevnar utd  mmr immune  Pap 05/07/12 negative  Mammogram 04/27/16 negative  DEXA 12/18/06 osteopenia vitamin D 80.01 01/04/18  Colonoscopy 03/04/97 hyperplastic polyps no f/u   I discussed the assessment and treatment plan with the patient. The patient was provided an opportunity to ask questions and all were answered. The patient agreed with the plan and demonstrated an understanding of the instructions.   The patient was advised to call back or seek an in-person evaluation if the symptoms worsen or if the condition fails to improve as anticipated.  Time spent 15 minutes  Delorise Jackson, MD

## 2018-08-15 ENCOUNTER — Encounter: Payer: Self-pay | Admitting: Internal Medicine

## 2018-08-16 ENCOUNTER — Other Ambulatory Visit: Payer: Self-pay | Admitting: Internal Medicine

## 2018-08-16 DIAGNOSIS — I872 Venous insufficiency (chronic) (peripheral): Secondary | ICD-10-CM

## 2018-08-16 MED ORDER — TRIAMCINOLONE ACETONIDE 0.1 % EX CREA: 1.0000 "application " | TOPICAL_CREAM | Freq: Two times a day (BID) | CUTANEOUS | 0 refills | Status: DC

## 2018-08-20 ENCOUNTER — Ambulatory Visit: Payer: PPO | Admitting: Podiatry

## 2018-08-23 ENCOUNTER — Encounter (INDEPENDENT_AMBULATORY_CARE_PROVIDER_SITE_OTHER): Payer: Self-pay | Admitting: Vascular Surgery

## 2018-08-23 ENCOUNTER — Ambulatory Visit (INDEPENDENT_AMBULATORY_CARE_PROVIDER_SITE_OTHER): Payer: PPO

## 2018-08-23 ENCOUNTER — Other Ambulatory Visit: Payer: Self-pay

## 2018-08-23 ENCOUNTER — Ambulatory Visit (INDEPENDENT_AMBULATORY_CARE_PROVIDER_SITE_OTHER): Payer: PPO | Admitting: Vascular Surgery

## 2018-08-23 VITALS — BP 154/89 | HR 80 | Resp 12 | Ht 60.0 in | Wt 192.0 lb

## 2018-08-23 DIAGNOSIS — E7800 Pure hypercholesterolemia, unspecified: Secondary | ICD-10-CM

## 2018-08-23 DIAGNOSIS — I5032 Chronic diastolic (congestive) heart failure: Secondary | ICD-10-CM | POA: Diagnosis not present

## 2018-08-23 DIAGNOSIS — N184 Chronic kidney disease, stage 4 (severe): Secondary | ICD-10-CM | POA: Diagnosis not present

## 2018-08-23 DIAGNOSIS — E1122 Type 2 diabetes mellitus with diabetic chronic kidney disease: Secondary | ICD-10-CM

## 2018-08-23 DIAGNOSIS — Z79899 Other long term (current) drug therapy: Secondary | ICD-10-CM

## 2018-08-23 DIAGNOSIS — I129 Hypertensive chronic kidney disease with stage 1 through stage 4 chronic kidney disease, or unspecified chronic kidney disease: Secondary | ICD-10-CM | POA: Diagnosis not present

## 2018-08-23 DIAGNOSIS — I872 Venous insufficiency (chronic) (peripheral): Secondary | ICD-10-CM | POA: Diagnosis not present

## 2018-08-23 DIAGNOSIS — I890 Lymphedema, not elsewhere classified: Secondary | ICD-10-CM

## 2018-08-23 DIAGNOSIS — I10 Essential (primary) hypertension: Secondary | ICD-10-CM

## 2018-08-23 NOTE — Progress Notes (Signed)
MRN : [id number]  [name] [name] is a 85 y.o. (02-25-1925) female who presents with chief complaint of No chief complaint on file. Marland Kitchen  History of Present Illness:   The patient returns to the office for followup evaluation regarding leg swelling.  The swelling has persisted and the pain associated with swelling continues. There have not been any interval development of a ulcerations or wounds.  Since the previous visit the patient has been wearing graduated compression stockings and has noted little if any improvement in the lymphedema. The patient has been using compression routinely morning until night.  The patient also states elevation during the day and exercise is being done too.   No outpatient medications have been marked as taking for the 08/23/18 encounter (Appointment) with Delana Meyer, Dolores Lory, MD.    Past Medical History:  Diagnosis Date  . Anemia   . Carotid arterial disease ([place])    a. 08/2014 U/S: Bilateral < 50% stenosis. Patent vertebrals w/ antegrade flow.   . Cervical spine arthritis   . Chronic diastolic CHF (congestive heart failure) ([place])    a. echo 2014: EF 55-60%, DD;  b. 08/2014 Echo: EF 55-60%, no RWMA, GR1DD; c. 02/2016 Echo: EF 65-70%, Gr1 DD.  Marland Kitchen Cognitive impairment   . Depression   . Diabetes mellitus without complication ([place])   . Essential hypertension   . GERD (gastroesophageal reflux disease)   . Glaucoma   . Gout   . History of renal impairment   . Hyperlipidemia   . Hypotension    a. Related to Norvasc - 11/2014 ED visit.  . Multinodular goiter    a. Noted incidentally on CT 07/2012 and carotid U/S 08/2014;  b. Nl TSH 11/2014.  Marland Kitchen Paroxysmal SVT (supraventricular tachycardia) (HCC)    a. on Toprol   . Prolapsed uterus   . Severe aortic stenosis    a. 11/15/16: s/p TAVR Edwards Sapien 3 THV (size 23 mm, model # 9600TFX, serial # [id number])  . Stroke [facility])    a. CT head 07/2014 showed small thalamic infarct  . Vitamin deficiency      Past Surgical History:  Procedure Laterality Date  . EYE SURGERY     unsure of what procedure, did know that laser was used  . IR RADIOLOGY PERIPHERAL GUIDED IV START  11/03/2016  . IR US GUIDE VASC ACCESS RIGHT  11/03/2016  . RIGHT/LEFT HEART CATH AND CORONARY ANGIOGRAPHY N/A 10/26/2016   Procedure: RIGHT/LEFT HEART CATH AND CORONARY ANGIOGRAPHY;  Surgeon: [name], MD;  Location: [place] [facility];  Service: Cardiovascular;  Laterality: N/A;  . TEE WITHOUT CARDIOVERSION N/A 03/31/2016   Procedure: TRANSESOPHAGEAL ECHOCARDIOGRAM (TEE);  Surgeon: Minna Merritts, MD;  Location: [facility];  Service: Cardiovascular;  Laterality: N/A;  . TEE WITHOUT CARDIOVERSION N/A 11/15/2016   Procedure: TRANSESOPHAGEAL ECHOCARDIOGRAM (TEE);  Surgeon: [name], MD;  Location: [place];  Service: Open Heart Surgery;  Laterality: N/A;  . TRANSCATHETER AORTIC VALVE REPLACEMENT, TRANSFEMORAL N/A 11/15/2016   Procedure: TRANSCATHETER AORTIC VALVE REPLACEMENT, TRANSFEMORAL;  Surgeon: [name], MD;  Location: [place];  Service: Open Heart Surgery;  Laterality: N/A;  . TUBAL LIGATION      Social History Social History   Tobacco Use  . Smoking status: Never Smoker  . Smokeless tobacco: Never Used  Substance Use Topics  . Alcohol use: No  . Drug use: No    Family History Family History  Problem Relation Age of Onset  . Glaucoma Mother   .  Diabetes Mother   . Peptic Ulcer Father   . Colitis Father     Allergies  Allergen Reactions  . Metformin Hcl Other (See Comments)    GI upset  . Metformin Other (See Comments), Diarrhea and Nausea And Vomiting  . Metformin And Related Diarrhea     REVIEW OF SYSTEMS (Negative unless checked)  Constitutional: [] Weight loss  [] Fever  [] Chills Cardiac: [] Chest pain   [] Chest pressure   [] Palpitations   [] Shortness of breath when laying flat   [] Shortness of breath with exertion. Vascular:  [] Pain in legs with walking   [x] Pain in legs at rest  [] History  of DVT   [] Phlebitis   [x] Swelling in legs   [] Varicose veins   [] Non-healing ulcers Pulmonary:   [] Uses home oxygen   [] Productive cough   [] Hemoptysis   [] Wheeze  [] COPD   [] Asthma Neurologic:  [] Dizziness   [] Seizures   [] History of stroke   [] History of TIA  [] Aphasia   [] Vissual changes   [] Weakness or numbness in arm   [] Weakness or numbness in leg Musculoskeletal:   [] Joint swelling   [] Joint pain   [] Low back pain Hematologic:  [] Easy bruising  [] Easy bleeding   [] Hypercoagulable state   [] Anemic Gastrointestinal:  [] Diarrhea   [] Vomiting  [] Gastroesophageal reflux/heartburn   [] Difficulty swallowing. Genitourinary:  [] Chronic kidney disease   [] Difficult urination  [] Frequent urination   [] Blood in urine Skin:  [] Rashes   [] Ulcers  Psychological:  [] History of anxiety   []  History of major depression.  Physical Examination  There were no vitals filed for this visit. There is no height or weight on file to calculate BMI. Gen: WD/WN, NAD Head: [place]/AT, No temporalis wasting.  Ear/Nose/Throat: Hearing grossly intact, nares w/o erythema or drainage Eyes: PER, EOMI, sclera nonicteric.  Neck: Supple, no large masses.   Pulmonary:  Good air movement, no audible wheezing bilaterally, no use of accessory muscles.  Cardiac: RRR, no JVD Vascular: scattered varicosities present bilaterally.  Mild venous stasis changes to the legs bilaterally.  3+ soft pitting edema Vessel Right Left  Radial Palpable Palpable  Gastrointestinal: Non-distended. No guarding/no peritoneal signs.  Musculoskeletal: M/S 5/5 throughout.  No deformity or atrophy.  Neurologic: CN 2-12 intact. Symmetrical.  Speech is fluent. Motor exam as listed above. Psychiatric: Judgment intact, Mood & affect appropriate for pt's clinical situation. Dermatologic: venous rashes no ulcers noted.  No changes consistent with cellulitis. Lymph : No lichenification or skin changes of chronic lymphedema.  CBC Lab Results  Component Value  Date   WBC 9.6 03/05/2018   HGB 11.5 (L) 03/05/2018   HCT 36.4 03/05/2018   MCV 95.5 03/05/2018   PLT 168 03/05/2018    BMET    Component Value Date/Time   NA 140 07/25/2018 1314   NA 143 03/13/2018 1037   NA 139 06/16/2014 1240   K 4.0 07/25/2018 1314   K 3.2 (L) 06/16/2014 1240   CL 97 (L) 07/25/2018 1314   CL 104 06/16/2014 1240   CO2 31 07/25/2018 1314   CO2 27 06/16/2014 1240   GLUCOSE 182 (H) 07/25/2018 1314   GLUCOSE 216 (H) 06/16/2014 1240   BUN 17 07/25/2018 1314   BUN 37 (H) 03/13/2018 1037   BUN 19 06/16/2014 1240   CREATININE 1.22 (H) 07/25/2018 1314   CREATININE 1.10 (H) 06/16/2014 1240   CALCIUM 10.1 07/25/2018 1314   CALCIUM 8.9 06/16/2014 1240   GFRNONAA 39 (L) 07/25/2018 1314   GFRNONAA 45 (L) 06/16/2014 1240  GFRAA 45 (L) 07/25/2018 1314   GFRAA 52 (L) 06/16/2014 1240   CrCl cannot be calculated (Patient's most recent lab result is older than the maximum 21 days allowed.).  COAG Lab Results  Component Value Date   INR 1.23 11/15/2016   INR 1.10 11/14/2016   INR 1.04 10/26/2016    Radiology No results found.   Assessment/Plan 1. Lymphedema Recommend:  No surgery or intervention at this point in time.    I have reviewed my previous discussion with the patient regarding swelling and why it causes symptoms.  Patient will continue wearing graduated compression stockings class 1 (20-30 mmHg) on a daily basis. The patient will  beginning wearing the stockings first thing in the morning and removing them in the evening. The patient is instructed specifically not to sleep in the stockings.    In addition, behavioral modification including several periods of elevation of the lower extremities during the day will be continued.  This was reviewed with the patient during the initial visit.  The patient will also continue routine exercise, especially walking on a daily basis as was discussed during the initial visit.    Despite conservative treatments  including graduated compression therapy class 1 and behavioral modification including exercise and elevation the patient  has not obtained adequate control of the lymphedema.  The patient still has stage 3 lymphedema and therefore, I believe that a lymph pump should be added to improve the control of the patient's lymphedema.  Additionally, a lymph pump is warranted because it will reduce the risk of cellulitis and ulceration in the future.  Patient should follow-up in six months    2. Chronic venous insufficiency Recommend:  No surgery or intervention at this point in time.    I have reviewed my previous discussion with the patient regarding swelling and why it causes symptoms.  Patient will continue wearing graduated compression stockings class 1 (20-30 mmHg) on a daily basis. The patient will  beginning wearing the stockings first thing in the morning and removing them in the evening. The patient is instructed specifically not to sleep in the stockings.    In addition, behavioral modification including several periods of elevation of the lower extremities during the day will be continued.  This was reviewed with the patient during the initial visit.  The patient will also continue routine exercise, especially walking on a daily basis as was discussed during the initial visit.    Despite conservative treatments including graduated compression therapy class 1 and behavioral modification including exercise and elevation the patient  has not obtained adequate control of the lymphedema.  The patient still has stage 3 lymphedema and therefore, I believe that a lymph pump should be added to improve the control of the patient's lymphedema.  Additionally, a lymph pump is warranted because it will reduce the risk of cellulitis and ulceration in the future.  Patient should follow-up in six months    3. Essential hypertension Continue antihypertensive medications as already ordered, these medications  have been reviewed and there are no changes at this time.   4. Chronic diastolic CHF (congestive heart failure) (HCC) Continue cardiac and antihypertensive medications as already ordered and reviewed, no changes at this time.  Continue statin as ordered and reviewed, no changes at this time  Nitrates PRN for chest pain   5. Type 2 diabetes mellitus with stage 4 chronic kidney disease, without long-term current use of insulin (HCC) Continue hypoglycemic medications as already ordered, these medications have  been reviewed and there are no changes at this time.  Hgb A1C to be monitored as already arranged by primary service   6. Pure hypercholesterolemia Continue statin as ordered and reviewed, no changes at this time     Hortencia Pilar, MD  08/23/2018 3:29 PM

## 2018-09-03 ENCOUNTER — Other Ambulatory Visit: Payer: Self-pay

## 2018-09-03 ENCOUNTER — Encounter: Payer: Self-pay | Admitting: Podiatry

## 2018-09-03 ENCOUNTER — Ambulatory Visit: Payer: PPO | Admitting: Podiatry

## 2018-09-03 DIAGNOSIS — M79675 Pain in left toe(s): Secondary | ICD-10-CM

## 2018-09-03 DIAGNOSIS — E1142 Type 2 diabetes mellitus with diabetic polyneuropathy: Secondary | ICD-10-CM

## 2018-09-03 DIAGNOSIS — E1140 Type 2 diabetes mellitus with diabetic neuropathy, unspecified: Secondary | ICD-10-CM | POA: Insufficient documentation

## 2018-09-03 DIAGNOSIS — M79674 Pain in right toe(s): Secondary | ICD-10-CM

## 2018-09-03 DIAGNOSIS — B351 Tinea unguium: Secondary | ICD-10-CM | POA: Diagnosis not present

## 2018-09-03 NOTE — Progress Notes (Signed)
Complaint:  Visit Type: Patient returns to my office for continued preventative foot care services. Complaint: Patient states" my nails have grown long and thick and become painful to walk and wear shoes" Patient has been diagnosed with DM with neuropathy. The patient presents for preventative foot care services. No changes to ROS.  Patient is taking gabapentin.  Podiatric Exam: Vascular: dorsalis pedis  pulses are weakly palpable bilateral. PT pulses are not palpable  B/L. Capillary return is immediate. Temperature gradient is WNL. Skin turgor WNL  Sensorium: Normal Semmes Weinstein monofilament test. Normal tactile sensation bilaterally. Nail Exam: Pt has thick disfigured discolored nails with subungual debris noted bilateral entire nail hallux through fifth toenails Ulcer Exam: There is no evidence of ulcer or pre-ulcerative changes or infection. Orthopedic Exam: Muscle tone and strength are WNL. No limitations in general ROM. No crepitus or effusions noted. Foot type and digits show no abnormalities. HAV  B/L. Skin: No Porokeratosis. No infection or ulcers  Diagnosis:  Onychomycosis, , Pain in right toe, pain in left toes,  Diabetes with vascular disease. HAV  B/L.  Treatment & Plan Procedures and Treatment: Consent by patient was obtained for treatment procedures.   Debridement of mycotic and hypertrophic toenails, 1 through 5 bilateral and clearing of subungual debris. No ulceration, no infection noted. Return Visit-Office Procedure: Patient instructed to return to the office for a follow up visit 3 months for continued evaluation and treatment.    Gardiner Barefoot DPM

## 2018-10-02 DIAGNOSIS — I890 Lymphedema, not elsewhere classified: Secondary | ICD-10-CM | POA: Diagnosis not present

## 2018-10-04 ENCOUNTER — Ambulatory Visit: Payer: PPO | Admitting: Family

## 2018-10-08 NOTE — Progress Notes (Signed)
Patient ID: [name], female    DOB: [birth year], 85 y.o.   MRN: [id number]  HPI  Ms [name] is a 85 y/o female with a history of Dm, hyperlipidemia, HTN, stroke, thyroid disease, GERD, anemia, SVT, severe aortic stenosis and chronic heart failure.   Echo report from 03/09/2018 reviewed and showed an EF of 50-55% along with mildly elevated PA pressure of 62m Hg.   Catheterization done 10/26/16 reviewed and showed: Patent coronary arteries with the exception of severe stenosis in the right PDA. There is mild nonobstructive stenosis of the LAD and RCA and a widely patent left main and left circumflex 2. Moderate to severe pulmonary hypertension with pulmonary artery pressure of 74/21 with a mean of 45 and a pulmonary capillary wedge pressure of 23 with a 32 mmHg T-wave 3. Mean transaortic valve gradient of 17 mmHg and calculated aortic valve area of 1.0 cm, suspect contamination of the pressure gradient with use of an AL-1 catheter and difficult measurement because of frequent ectopy  Has not been admitted or been in the ED in the last 6 months.   She presents today for a follow-up visit with a chief complaint of moderate fatigue upon minimal exertion. She describes this as chronic in nature having been present for several years. She has associated shortness of breath, pedal edema and difficulty sleeping along with this. She denies any dizziness, abdominal distention, palpitations, chest pain or cough. Has been wearing compression boots twice daily for the last week and feels like her swelling has improved.   Past Medical History:  Diagnosis Date  . Anemia   . Carotid arterial disease ([facility]    a. 08/2014 U/S: Bilateral < 50% stenosis. Patent vertebrals w/ antegrade flow.   . Cervical spine arthritis   . Chronic diastolic CHF (congestive heart failure) ([facility]    a. echo 2014: EF 55-60%, DD;  b. 08/2014 Echo: EF 55-60%, no RWMA, GR1DD; c. 02/2016 Echo: EF 65-70%, Gr1 DD.  .Marland KitchenCognitive  impairment   . Depression   . Diabetes mellitus without complication ([facility]   . Essential hypertension   . GERD (gastroesophageal reflux disease)   . Glaucoma   . Gout   . History of renal impairment   . Hyperlipidemia   . Hypotension    a. Related to Norvasc - 11/2014 ED visit.  . Multinodular goiter    a. Noted incidentally on CT 07/2012 and carotid U/S 08/2014;  b. Nl TSH 11/2014.  .Marland KitchenParoxysmal SVT (supraventricular tachycardia) (HCC)    a. on Toprol   . Prolapsed uterus   . Severe aortic stenosis    a. 11/15/16: s/p TAVR Edwards Sapien 3 THV (size 23 mm, model # 9600TFX, serial # [id number]  . Stroke ([facility]    a. CT head 07/2014 showed small thalamic infarct  . Vitamin deficiency    Past Surgical History:  Procedure Laterality Date  . EYE SURGERY     unsure of what procedure, did know that laser was used  . IR RADIOLOGY PERIPHERAL GUIDED IV START  11/03/2016  . IR [place] V[place] ACCESS RIGHT  11/03/2016  . RIGHT/LEFT HEART CATH AND CORONARY ANGIOGRAPHY N/A 10/26/2016   Procedure: RIGHT/LEFT HEART CATH AND CORONARY ANGIOGRAPHY;  Surgeon: CSherren Mocha MD;  [facility];  Service: Cardiovascular;  Laterality: N/A;  . TEE WITHOUT CARDIOVERSION N/A 03/31/2016   Procedure: TRANSESOPHAGEAL ECHOCARDIOGRAM (TEE);  Surgeon: TMinna Merritts MD;  Location: [facility] OR[facility];  Service: Cardiovascular;  Laterality:  N/A;  . TEE WITHOUT CARDIOVERSION N/A 11/15/2016   Procedure: TRANSESOPHAGEAL ECHOCARDIOGRAM (TEE);  Surgeon: [name], MD;  Location: [place];  Service: Open Heart Surgery;  Laterality: N/A;  . TRANSCATHETER AORTIC VALVE REPLACEMENT, TRANSFEMORAL N/A 11/15/2016   Procedure: TRANSCATHETER AORTIC VALVE REPLACEMENT, TRANSFEMORAL;  Surgeon: [name], MD;  Location: [place];  Service: Open Heart Surgery;  Laterality: N/A;  . TUBAL LIGATION     Family History  Problem Relation Age of Onset  . Glaucoma Mother   . Diabetes Mother   . Peptic Ulcer Father   . Colitis Father     Social History   Tobacco Use  . Smoking status: Never Smoker  . Smokeless tobacco: Never Used  Substance Use Topics  . Alcohol use: No   Allergies  Allergen Reactions  . Metformin Hcl Other (See Comments)    GI upset  . Metformin Other (See Comments), Diarrhea and Nausea And Vomiting  . Metformin And Related Diarrhea   Prior to Admission medications   Medication Sig Start Date End Date Taking? Authorizing Provider  ACCU-CHEK AVIVA PLUS test strip USE 1 STRIP TO TEST BLOOD SUGARS 3 TIMES A DAY 04/15/18  Yes McLean-Scocuzza, Nino Glow, MD  acetaminophen (TYLENOL) 500 MG tablet Take 1 tablet (500 mg total) by mouth every 6 (six) hours as needed for mild pain or headache. 04/15/18  Yes McLean-Scocuzza, Nino Glow, MD  allopurinol (ZYLOPRIM) 100 MG tablet Take 1 tablet (100 mg total) by mouth daily. 04/15/18  Yes McLean-Scocuzza, Nino Glow, MD  aspirin 81 MG chewable tablet Chew 1 tablet (81 mg total) by mouth daily. 04/15/18  Yes McLean-Scocuzza, Nino Glow, MD  blood glucose meter kit and supplies Dispense based on patient and insurance preference. Use 1x daily as directed. (FOR ICD-10 E10.9, E11.9). 11/30/17  Yes McLean-Scocuzza, Nino Glow, MD  brimonidine (ALPHAGAN P) 0.1 % SOLN Place 1 drop into the right eye 2 (two) times daily.   Yes [provider]  Carboxymethylcellulose Sodium (ARTIFICIAL TEARS OP) Apply 1 drop to eye 2 (two) times daily. Left eye   Yes [provider]  cholecalciferol (VITAMIN D) 25 MCG (1000 UT) tablet Take 1 tablet (1,000 Units total) by mouth daily. 04/15/18  Yes McLean-Scocuzza, Nino Glow, MD  clotrimazole (LOTRIMIN) 1 % cream APPLY TOPICALLY TO THE AFFECTED AREA ONCE A DAY AT BEDTIME FOR 7-14 DAYS 04/15/18  Yes McLean-Scocuzza, Nino Glow, MD  dimethicone (PROSHIELD PLUS SKIN PROTECTANT) 1 % cream 1 application to the buttock 2 times daily to help protect skin 04/15/18  Yes McLean-Scocuzza, Nino Glow, MD  Dorzolamide HCl-Timolol Mal (COSOPT OP) Place 1 drop into the  right eye 2 (two) times daily.    Yes [provider]  feeding supplement, ENSURE ENLIVE, (ENSURE ENLIVE) LIQD Take 237 mLs by mouth 2 (two) times daily between meals. 04/01/16  Yes Fritzi Mandes, MD  fluticasone (FLONASE) 50 MCG/ACT nasal spray Place 1 spray into both nostrils daily. Prn max 2 sprays total each nose 04/15/18  Yes McLean-Scocuzza, Nino Glow, MD  gabapentin (NEURONTIN) 300 MG capsule Take 1 capsule (300 mg total) by mouth 2 (two) times daily. 04/15/18  Yes McLean-Scocuzza, Nino Glow, MD  glucose blood test strip Accu-Chek Aviva Plus test strips 04/15/18  Yes McLean-Scocuzza, Nino Glow, MD  iron polysaccharides (NIFEREX) 150 MG capsule Take 1 capsule (150 mg total) by mouth daily. Note reduction in frequency from 2x to 1 x per day Patient taking differently: Take 150 mg by mouth daily.  04/15/18  Yes McLean-Scocuzza, Nino Glow, MD  latanoprost (XALATAN) 0.005 % ophthalmic solution Place 1 drop into the right eye at bedtime.    Yes [provider]  lisinopril (ZESTRIL) 2.5 MG tablet TAKE 1 TABLET (2.5 MG TOTAL) BY MOUTH DAILY. HOLD FOR BLOOD PRESSURE LOWER THAN 90/60 07/06/18  Yes [provider]  lovastatin (MEVACOR) 40 MG tablet Take 1 tablet (40 mg total) by mouth at bedtime. 04/15/18  Yes McLean-Scocuzza, Nino Glow, MD  metoprolol succinate (TOPROL-XL) 25 MG 24 hr tablet Take 1 tablet (25 mg total) by mouth daily. 04/15/18  Yes McLean-Scocuzza, Nino Glow, MD  Multiple Vitamin (MULTI-VITAMIN PO) Take 1 tablet by mouth daily.   Yes [provider]  mupirocin ointment (BACTROBAN) 2 % APPLY 1 APPLICATION INTO NOSE 2 TIMES DAILY 07/27/18  Yes McLean-Scocuzza, Nino Glow, MD  mupirocin ointment (BACTROBAN) 2 % Apply 1 application topically 2 (two) times daily. To open sores as needed 07/26/18  Yes McLean-Scocuzza, Nino Glow, MD  Nutritional Supplements (BOOST GLUCOSE CONTROL PO) Take 1 Container by mouth daily.   Yes [provider]  Jonetta Speak LANCETS 74B MISC USE TO  TEST BLOOD SUGAR ONCE A DAY 11/30/17  Yes [provider]  pantoprazole (PROTONIX) 40 MG tablet Take 1 tablet (40 mg total) by mouth daily. 30 minutes before breakfast OR dinner 04/15/18  Yes McLean-Scocuzza, Nino Glow, MD  pilocarpine (PILOCAR) 4 % ophthalmic solution Place 1 drop into the right eye 4 (four) times daily.    Yes [provider]  polyethylene glycol (MIRALAX) packet Take 17 g by mouth daily. prn 04/15/18  Yes McLean-Scocuzza, Nino Glow, MD  potassium chloride (K-DUR) 10 MEQ tablet Take 1 tablet (10 mEq total) by mouth daily. With fluid pill 07/30/18  Yes Gollan, Kathlene November, MD  Potassium Chloride ER 20 MEQ TBCR TAKE 1 TABLET BY MOUTH EVERY DAY WITH FLUID PILL 07/30/18  Yes [provider]  senna-docusate (SENOKOT-S) 8.6-50 MG tablet Take 2 tablets by mouth daily as needed for mild constipation.    Yes [provider]  sertraline (ZOLOFT) 50 MG tablet Take 1 tablet (50 mg total) by mouth daily. 04/15/18  Yes McLean-Scocuzza, Nino Glow, MD  sitaGLIPtin (JANUVIA) 50 MG tablet Take 1 tablet (50 mg total) by mouth daily. 04/15/18  Yes McLean-Scocuzza, Nino Glow, MD  torsemide (DEMADEX) 20 MG tablet Take 1.5 tablets (30 mg total) by mouth daily. 06/19/18 10/09/18 Yes Gollan, Kathlene November, MD  traZODone (DESYREL) 100 MG tablet Take 0.5-1 tablets (50-100 mg total) by mouth at bedtime as needed. 04/15/18  Yes McLean-Scocuzza, Nino Glow, MD  triamcinolone cream (KENALOG) 0.1 % Apply 1 application topically 2 (two) times daily. Prn legs 08/16/18  Yes McLean-Scocuzza, Nino Glow, MD  vitamin C (ASCORBIC ACID) 500 MG tablet Take 1 tablet (500 mg total) by mouth daily. 04/15/18  Yes McLean-Scocuzza, Nino Glow, MD  Zinc Oxide 10 % OINT Apply 1 application topically daily as needed (rash). Applied to patient's bottom 04/15/18  Yes McLean-Scocuzza, Nino Glow, MD    Review of Systems  Constitutional: Positive for fatigue (easily). Negative for appetite change.  HENT: Negative for congestion,  postnasal drip and sore throat.   Eyes: Negative.   Respiratory: Positive for shortness of breath (minimal). Negative for chest tightness.   Cardiovascular: Positive for leg swelling. Negative for chest pain and palpitations.  Gastrointestinal: Negative for abdominal distention and abdominal pain.  Endocrine: Negative.   Genitourinary: Negative.   Musculoskeletal: Positive for arthralgias (left knee). Negative for back  pain and neck pain.  Skin: Negative.   Allergic/Immunologic: Negative.   Neurological: Negative for dizziness and light-headedness.  Hematological: Negative for adenopathy. Does not bruise/bleed easily.  Psychiatric/Behavioral: Positive for sleep disturbance. Negative for dysphoric mood. The patient is not nervous/anxious.     Vitals:   10/09/18 1257  BP: (!) 142/58  Pulse: 71  Resp: 18  SpO2: 93%  Weight: 187 lb (84.8 kg)  Height: 5' (1.524 m)   Wt Readings from Last 3 Encounters:  10/09/18 187 lb (84.8 kg)  08/23/18 192 lb (87.1 kg)  07/05/18 185 lb (83.9 kg)   Lab Results  Component Value Date   CREATININE 1.22 (H) 07/25/2018   CREATININE 1.26 (H) 05/25/2018   CREATININE 2.54 (H) 04/11/2018    Physical Exam Vitals signs and nursing note reviewed.  Constitutional:      Appearance: Normal appearance.  HENT:     Head: Normocephalic and atraumatic.  Neck:     Musculoskeletal: Normal range of motion and neck supple.  Cardiovascular:     Rate and Rhythm: Normal rate and regular rhythm.  Pulmonary:     Effort: Pulmonary effort is normal. No respiratory distress.     Breath sounds: No wheezing or rales.  Abdominal:     General: There is no distension.     Palpations: Abdomen is soft.  Musculoskeletal:        General: No tenderness.     Right lower leg: Edema (1+ pitting) present.     Left lower leg: Edema (1+ pitting) present.  Skin:    General: Skin is warm and dry.  Neurological:     General: No focal deficit present.     Mental Status: She is  alert and oriented to person, place, and time.  Psychiatric:        Mood and Affect: Mood normal.        Behavior: Behavior normal.    Assessment & Plan:  1: Chronic heart failure with preserved ejection fraction- - NYHA class III - euvolemic today - weighing daily and daughter was reminded to call for an overnight weight gain of >2 pounds or a weekly weight gain of >5 pounds - weight up 8 pounds since her last visit here 6 months ago - not adding salt to her food except a "sprinkle" when eating seafood.  - limited in diuretic use due to renal function - saw cardiology ([place]) 03/13/2018 - BNP 07/25/2018 was 95.0  2: HTN- - BP looks good today - saw PCP (McLean-Scocuzza) 08/14/2018 - BMP from 07/25/2018 reviewed and showed sodium 140, potassium 4.0, creatinine 1.22 and GFR 45  3: DM- - A1c 04/11/2018 was 7.8% - patient did not check glucose this morning  4: Lymphedema- - stage 2 - saw vascular 08/23/2018 - elevating her legs "at times"; encouraged her to elevate her legs when sitting for long periods of time - has been wearing support socks daily - limited in her ability to exercise due to her fatigue - has been wearing compression boots twice daily for the last week and family member says that her swelling improves after wearing them.   Medication bottles were reviewed.  Return in 3 months or sooner for any questions/problems before then.

## 2018-10-09 ENCOUNTER — Encounter: Payer: Self-pay | Admitting: Family

## 2018-10-09 ENCOUNTER — Ambulatory Visit: Payer: PPO | Attending: Family | Admitting: Family

## 2018-10-09 ENCOUNTER — Other Ambulatory Visit: Payer: Self-pay

## 2018-10-09 VITALS — BP 142/58 | HR 71 | Resp 18 | Ht 60.0 in | Wt 187.0 lb

## 2018-10-09 DIAGNOSIS — I110 Hypertensive heart disease with heart failure: Secondary | ICD-10-CM | POA: Insufficient documentation

## 2018-10-09 DIAGNOSIS — E785 Hyperlipidemia, unspecified: Secondary | ICD-10-CM | POA: Diagnosis not present

## 2018-10-09 DIAGNOSIS — F329 Major depressive disorder, single episode, unspecified: Secondary | ICD-10-CM | POA: Diagnosis not present

## 2018-10-09 DIAGNOSIS — Z79899 Other long term (current) drug therapy: Secondary | ICD-10-CM | POA: Insufficient documentation

## 2018-10-09 DIAGNOSIS — K219 Gastro-esophageal reflux disease without esophagitis: Secondary | ICD-10-CM | POA: Insufficient documentation

## 2018-10-09 DIAGNOSIS — Z833 Family history of diabetes mellitus: Secondary | ICD-10-CM | POA: Insufficient documentation

## 2018-10-09 DIAGNOSIS — D649 Anemia, unspecified: Secondary | ICD-10-CM | POA: Diagnosis not present

## 2018-10-09 DIAGNOSIS — I471 Supraventricular tachycardia: Secondary | ICD-10-CM | POA: Insufficient documentation

## 2018-10-09 DIAGNOSIS — Z8673 Personal history of transient ischemic attack (TIA), and cerebral infarction without residual deficits: Secondary | ICD-10-CM | POA: Diagnosis not present

## 2018-10-09 DIAGNOSIS — R4189 Other symptoms and signs involving cognitive functions and awareness: Secondary | ICD-10-CM | POA: Diagnosis not present

## 2018-10-09 DIAGNOSIS — E079 Disorder of thyroid, unspecified: Secondary | ICD-10-CM | POA: Diagnosis not present

## 2018-10-09 DIAGNOSIS — E1122 Type 2 diabetes mellitus with diabetic chronic kidney disease: Secondary | ICD-10-CM

## 2018-10-09 DIAGNOSIS — Z7984 Long term (current) use of oral hypoglycemic drugs: Secondary | ICD-10-CM | POA: Diagnosis not present

## 2018-10-09 DIAGNOSIS — I10 Essential (primary) hypertension: Secondary | ICD-10-CM

## 2018-10-09 DIAGNOSIS — M109 Gout, unspecified: Secondary | ICD-10-CM | POA: Insufficient documentation

## 2018-10-09 DIAGNOSIS — E119 Type 2 diabetes mellitus without complications: Secondary | ICD-10-CM | POA: Diagnosis not present

## 2018-10-09 DIAGNOSIS — I890 Lymphedema, not elsewhere classified: Secondary | ICD-10-CM

## 2018-10-09 DIAGNOSIS — Z7982 Long term (current) use of aspirin: Secondary | ICD-10-CM | POA: Diagnosis not present

## 2018-10-09 DIAGNOSIS — I350 Nonrheumatic aortic (valve) stenosis: Secondary | ICD-10-CM | POA: Diagnosis not present

## 2018-10-09 DIAGNOSIS — I5032 Chronic diastolic (congestive) heart failure: Secondary | ICD-10-CM | POA: Diagnosis not present

## 2018-10-09 NOTE — Patient Instructions (Signed)
Continue weighing daily and call for an overnight weight gain of > 2 pounds or a weekly weight gain of >5 pounds. 

## 2018-10-10 ENCOUNTER — Other Ambulatory Visit: Payer: PPO

## 2018-10-10 ENCOUNTER — Encounter: Payer: Self-pay | Admitting: Family

## 2018-10-19 ENCOUNTER — Other Ambulatory Visit: Payer: Self-pay

## 2018-10-19 ENCOUNTER — Encounter: Payer: PPO | Attending: Physician Assistant | Admitting: Physician Assistant

## 2018-10-19 DIAGNOSIS — I110 Hypertensive heart disease with heart failure: Secondary | ICD-10-CM | POA: Diagnosis not present

## 2018-10-19 DIAGNOSIS — L97812 Non-pressure chronic ulcer of other part of right lower leg with fat layer exposed: Secondary | ICD-10-CM | POA: Diagnosis not present

## 2018-10-19 DIAGNOSIS — I2510 Atherosclerotic heart disease of native coronary artery without angina pectoris: Secondary | ICD-10-CM | POA: Diagnosis not present

## 2018-10-19 DIAGNOSIS — M170 Bilateral primary osteoarthritis of knee: Secondary | ICD-10-CM | POA: Diagnosis not present

## 2018-10-19 DIAGNOSIS — L97822 Non-pressure chronic ulcer of other part of left lower leg with fat layer exposed: Secondary | ICD-10-CM | POA: Insufficient documentation

## 2018-10-19 DIAGNOSIS — I890 Lymphedema, not elsewhere classified: Secondary | ICD-10-CM | POA: Diagnosis not present

## 2018-10-19 DIAGNOSIS — Z8249 Family history of ischemic heart disease and other diseases of the circulatory system: Secondary | ICD-10-CM | POA: Insufficient documentation

## 2018-10-19 DIAGNOSIS — E11622 Type 2 diabetes mellitus with other skin ulcer: Secondary | ICD-10-CM | POA: Insufficient documentation

## 2018-10-19 DIAGNOSIS — I509 Heart failure, unspecified: Secondary | ICD-10-CM | POA: Insufficient documentation

## 2018-10-19 DIAGNOSIS — J449 Chronic obstructive pulmonary disease, unspecified: Secondary | ICD-10-CM | POA: Diagnosis not present

## 2018-10-19 DIAGNOSIS — E1151 Type 2 diabetes mellitus with diabetic peripheral angiopathy without gangrene: Secondary | ICD-10-CM | POA: Insufficient documentation

## 2018-10-19 DIAGNOSIS — L97222 Non-pressure chronic ulcer of left calf with fat layer exposed: Secondary | ICD-10-CM | POA: Diagnosis not present

## 2018-10-19 DIAGNOSIS — I872 Venous insufficiency (chronic) (peripheral): Secondary | ICD-10-CM | POA: Insufficient documentation

## 2018-10-19 NOTE — Progress Notes (Signed)
[name] ([id number]) Visit Report for 10/19/2018 Abuse/Suicide Risk Screen Details Patient Name: [name], [name]. Date of Service: 10/19/2018 2:15 PM Medical Record Number: [id number] Patient Account Number: [id number] Date of Birth/Sex: Oct 17, 1924 (85 y.o. F) Treating RN: Army Melia Primary Care Mikey Maffett: Myrtie Hawk Other Clinician: Referring Sadie Hazelett: Referral, Self Treating Alishah Schulte/Extender: STONE III, HOYT Weeks in Treatment: 0 Abuse/Suicide Risk Screen Items Answer ABUSE RISK SCREEN: Has anyone close to you tried to hurt or harm you recentlyo No Do you feel uncomfortable with anyone in your familyo No Has anyone forced you do things that you didnot want to doo No Electronic Signature(s) Signed: 10/19/2018 4:20:53 PM By: Army Melia Entered By: Army Melia on 10/19/2018 14:03:03 [name], [name] ([id number]) -------------------------------------------------------------------------------- Activities of Daily Living Details Patient Name: [name], Cassandra M. Date of Service: 10/19/2018 2:15 PM Medical Record Number: [id number] Patient Account Number: [id number] Date of Birth/Sex: May 27, 1924 (85 y.o. F) Treating RN: Army Melia Primary Care Kedar Sedano: Myrtie Hawk Other Clinician: Referring Maryalice Pasley: Referral, Self Treating Judithann Villamar/Extender: STONE III, HOYT Weeks in Treatment: 0 Activities of Daily Living Items Answer Activities of Daily Living (Please select one for each item) Drive Automobile Not Able Take Medications Completely Able Use Telephone Completely Able Care for Appearance Need Assistance Use Toilet Need Assistance Bath / Shower Need Assistance Dress Self Need Assistance Feed Self Need Assistance Walk Need Assistance Get In / Out Bed Need Assistance Housework Need Assistance Prepare Meals Need Assistance Handle Money Need Assistance Shop for Self Need Assistance Electronic Signature(s) Signed:  10/19/2018 4:20:53 PM By: Army Melia Entered By: Army Melia on 10/19/2018 14:03:29 [name], [name] ([id number]) -------------------------------------------------------------------------------- Education Screening Details Patient Name: [name], Cassandra M. Date of Service: 10/19/2018 2:15 PM Medical Record Number: [id number] Patient Account Number: [id number] Date of Birth/Sex: February 21, 1925 (85 y.o. F) Treating RN: Army Melia Primary Care Monzerrat Wellen: Myrtie Hawk Other Clinician: Referring Cherron Blitzer: Referral, Self Treating Ayzia Day/Extender: Melburn Hake, HOYT Weeks in Treatment: 0 Primary Learner Assessed: Patient Learning Preferences/Education Level/Primary Language Learning Preference: Explanation, Demonstration Highest Education Level: Grade School Preferred Language: English Cognitive Barrier Language Barrier: No Translator Needed: No Memory Deficit: No Emotional Barrier: No Cultural/Religious Beliefs Affecting Medical Care: No Physical Barrier Impaired Vision: No Impaired Hearing: No Decreased Hand dexterity: No Knowledge/Comprehension Knowledge Level: High Comprehension Level: High Ability to understand written High instructions: Ability to understand verbal High instructions: Motivation Anxiety Level: Calm Cooperation: Cooperative Education Importance: Acknowledges Need Interest in Health Problems: Asks Questions Perception: Coherent Willingness to Engage in Self- High Management Activities: Readiness to Engage in Self- High Management Activities: Electronic Signature(s) Signed: 10/19/2018 4:20:53 PM By: Army Melia Entered By: Army Melia on 10/19/2018 14:03:53 [name], [name] ([id number]) -------------------------------------------------------------------------------- Fall Risk Assessment Details Patient Name: [name], Cassandra M. Date of Service: 10/19/2018 2:15 PM Medical Record Number: [id number] Patient Account  Number: [id number] Date of Birth/Sex: [birth year] (85 y.o. F) Treating RN: Army Melia Primary Care Markail Diekman: Myrtie Hawk Other Clinician: Referring Jennie Bolar: Referral, Self Treating Nejla Reasor/Extender: STONE III, HOYT Weeks in Treatment: 0 Fall Risk Assessment Items Have you had 2 or more falls in the last 12 monthso 0 No Have you had any fall that resulted in injury in the last 12 monthso 0 No FALLS RISK SCREEN History of falling - immediate or within 3 months 0 No Secondary diagnosis (Do you have 2 or more medical diagnoseso) 0 No Ambulatory aid None/bed rest/wheelchair/nurse 0 No Crutches/cane/walker 15 Yes Furniture 0 No Intravenous therapy Access/Saline/Heparin Lock 0 No  Gait/Transferring Normal/ bed rest/ wheelchair 0 No Weak (short steps with or without shuffle, stooped but able to lift head while 10 Yes walking, may seek support from furniture) Impaired (short steps with shuffle, may have difficulty arising from chair, head 20 Yes down, impaired balance) Mental Status Oriented to own ability 0 No Electronic Signature(s) Signed: 10/19/2018 4:20:53 PM By: Army Melia Entered By: Army Melia on 10/19/2018 14:04:10 [name], [name] ([id number]) -------------------------------------------------------------------------------- Foot Assessment Details Patient Name: [name], Cassandra M. Date of Service: 10/19/2018 2:15 PM Medical Record Number: [id number] Patient Account Number: [id number] Date of Birth/Sex: 12-31-1924 (85 y.o. F) Treating RN: Army Melia Primary Care Taylor Levick: Myrtie Hawk Other Clinician: Referring Tanyika Barros: Referral, Self Treating Marca Gadsby/Extender: STONE III, HOYT Weeks in Treatment: 0 Foot Assessment Items Site Locations + = Sensation present, - = Sensation absent, C = Callus, U = Ulcer R = Redness, W = Warmth, M = Maceration, PU = Pre-ulcerative lesion F = Fissure, S = Swelling, D = Dryness Assessment Right:  Left: Other Deformity: No No Prior Foot Ulcer: No No Prior Amputation: No No Charcot Joint: No No Ambulatory Status: Ambulatory With Help Assistance Device: Walker Gait: Electronic Signature(s) Signed: 10/19/2018 4:20:53 PM By: Army Melia Entered By: Army Melia on 10/19/2018 14:07:08 [name], [name] ([id number]) -------------------------------------------------------------------------------- Nutrition Risk Screening Details Patient Name: [name], Cassandra M. Date of Service: 10/19/2018 2:15 PM Medical Record Number: [id number] Patient Account Number: [id number] Date of Birth/Sex: [birth year] (85 y.o. F) Treating RN: Army Melia Primary Care Vadim Centola: Myrtie Hawk Other Clinician: Referring Fifi Schindler: Referral, Self Treating Kolson Chovanec/Extender: STONE III, HOYT Weeks in Treatment: 0 Height (in): 60 Weight (lbs): 190 Body Mass Index (BMI): 37.1 Nutrition Risk Screening Items Score Screening NUTRITION RISK SCREEN: I have an illness or condition that made me change the kind and/or amount of 0 No food I eat I eat fewer than two meals per day 0 No I eat few fruits and vegetables, or milk products 0 No I have three or more drinks of beer, liquor or wine almost every day 0 No I have tooth or mouth problems that make it hard for me to eat 0 No I don't always have enough money to buy the food I need 0 No I eat alone most of the time 0 No I take three or more different prescribed or over-the-counter drugs a day 0 No Without wanting to, I have lost or gained 10 pounds in the last six months 0 No I am not always physically able to shop, cook and/or feed myself 0 No Nutrition Protocols Good Risk Protocol 0 No interventions needed Moderate Risk Protocol High Risk Proctocol Risk Level: Good Risk Score: 0 Electronic Signature(s) Signed: 10/19/2018 4:20:53 PM By: Army Melia Entered By: Army Melia on 10/19/2018 14:04:17

## 2018-10-19 NOTE — Progress Notes (Signed)
[name] ([id number]) Visit Report for 10/19/2018 Chief Complaint Document Details Patient Name: [name], LANGHANS. Date of Service: 10/19/2018 2:15 PM Medical Record Number: [id number] Patient Account Number: [id number] Date of Birth/Sex: Sep 16, 1924 (85 y.o. F) Treating RN: Montey Hora Primary Care Provider: Myrtie Hawk Other Clinician: Referring Provider: Referral, Self Treating Provider/Extender: Melburn Hake, Leda [place] Weeks in Treatment: 0 Information Obtained from: Patient Chief Complaint Bilateral LE lymphedema Electronic Signature(s) Signed: 10/19/2018 2:24:10 PM By: Worthy Keeler PA-C Entered By: Worthy Keeler on 10/19/2018 14:24:10 [name], [name] ([id number]) -------------------------------------------------------------------------------- HPI Details Patient Name: [name], Cassandra M. Date of Service: 10/19/2018 2:15 PM Medical Record Number: [id number] Patient Account Number: [id number] Date of Birth/Sex: 07/28/1924 (85 y.o. F) Treating RN: Montey Hora Primary Care Provider: Myrtie Hawk Other Clinician: Referring Provider: Referral, Self Treating Provider/Extender: Melburn Hake, Danial Sisley Weeks in Treatment: 0 History of Present Illness HPI Description: 04/17/18 on evaluation today patient presents for initial inspection in our clinic concerning issues that she has been having with her gluteal region. Things actually appear to be doing much better at this point although we could go her daughter tells me that the patient was actually having areas that were draining in the gluteal region bilaterally. Fortunately these regions appear to have resolved. Still there is evidence of pressure injury to the gluteal region she has blanchable erythema but decreased capillary refill to the area which again is an early sign of pressure. Again I do think that her daily activity may be the cause of such. She does have a history of diabetes  with a hemoglobin A1c of 7.8 on 04/11/18. She also has gout and hypertension. The patient does get around in her home according to what her daughter tells me. However she spends most of her day and actually her night as well in her recliner which I think is likely where the pressure is coming from. She tells me that she doesn't sleep in a bed and pretty much unless she's getting up to use the bathroom or eat she is in her recliner. This again I think is likely the biggest issue that we're seeing right now for what's causing the pressure to her gluteal region. 08/02/18 on evaluation today patient actually presents for reevaluation in clinic concerning issues that she's been having with her gluteal region which the good news is appears to be completely healed at this time. There's no open wound currently. She did have bilateral lower extremity lymphedema which was starting to week more on the right lower extremity especially. With that being said this seems to be also doing better in resolving she's not wearing the compression stockings of I think she really is that compression stockings pretty much all the time. She does have TBI's that were performed and finalized on 04/30/18 they appear to be doing well with a right TBI of 0.70 in the left TBI of 0.98. Overall I feel like the patient is actually doing quite well although we do need to have some things that we go through with them as far as teaching is concerned in order to prevent issues from showing up in the future. No fevers, chills, nausea, or vomiting noted at this time. 10/19/2018 upon evaluation today patient presents for reevaluation our clinic concerning issues that she is actually having with her bilateral lower extremities. I have seen her a couple times previous where we have discussed a wound on her gluteal region which actually was not giving her any trouble  technically the last time I saw her. She is always had issues with her legs and  swelling she does have lymphedema pumps. In fact by some crazy coincidence she actually ended up having them ordered from 2 different locations all likely from the congestive heart failure clinic as well as vascular and ended up with 2 sets of lymphedema pumps. Nonetheless 1 of them she got covered by insurance the other they had to pay $700 for. Obviously I am not exactly sure what is going on in this regard but she obviously does not need to have 2 sets of pumps. Fortunately there are no signs of infection in regard to patient's bilateral lower extremities. She has been tolerating the dressing changes without complication. No fevers, chills, nausea, vomiting, or diarrhea. She was wearing compression stockings for some time until her leg started to weep. She was also using the lymphedema pumps according to her daughter who was present during the visit today until she started having issues with the weeping as well and then they were sure that she continue to use them. Electronic Signature(s) Signed: 10/19/2018 3:09:55 PM By: Worthy Keeler PA-C Entered By: Worthy Keeler on 10/19/2018 15:09:55 [place] ([id number]) -------------------------------------------------------------------------------- Physical Exam Details Patient Name: [name], Cassandra M. Date of Service: 10/19/2018 2:15 PM Medical Record Number: [id number] Patient Account Number: [id number] Date of Birth/Sex: 1924-12-13 (85 y.o. F) Treating RN: Montey Hora Primary Care Provider: Myrtie Hawk Other Clinician: Referring Provider: Referral, Self Treating Provider/Extender: STONE III, Moriah Shawley Weeks in Treatment: 0 Constitutional patient is hypertensive.. pulse regular and within target range for patient.Marland Kitchen respirations regular, non-labored and within target range for patient.Marland Kitchen temperature within target range for patient.. Well-nourished and well-hydrated in no acute distress. Eyes conjunctiva clear no  eyelid edema noted. pupils equal round and reactive to light and accommodation. Ears, Nose, Mouth, and Throat no gross abnormality of ear auricles or external auditory canals. normal hearing noted during conversation. mucus membranes moist. Respiratory normal breathing without difficulty. clear to auscultation bilaterally. Cardiovascular regular rate and rhythm with normal S1, S2. 1+ dorsalis pedis/posterior tibialis pulses. 1+ pitting edema of the bilateral lower extremities. Gastrointestinal (GI) soft, non-tender, non-distended, +BS. no ventral hernia noted. Musculoskeletal Patient unable to walk without assistance. no significant deformity or arthritic changes, no loss or range of motion, no clubbing. Psychiatric this patient is able to make decisions and demonstrates good insight into disease process. Alert and Oriented x 3. pleasant and cooperative. Notes Upon inspection today patient has multiple wounds noted over the bilateral lower extremities at this time. None of these areas appear to be infected which is good news. With that being said she does have several areas of opening which are weeping some worse than others. The largest on the right is actually on the medial part of the leg the largest on the left are really 3 similarly sized wound scattered over the anterior lower extremity. Electronic Signature(s) Signed: 10/19/2018 3:10:48 PM By: Worthy Keeler PA-C Entered By: Worthy Keeler on 10/19/2018 15:10:48 [place] ([id number]) -------------------------------------------------------------------------------- Physician Orders Details Patient Name: [name], Carmelita M. Date of Service: 10/19/2018 2:15 PM Medical Record Number: [id number] Patient Account Number: [id number] Date of Birth/Sex: 1924/03/22 (85 y.o. F) Treating RN: Montey Hora Primary Care Provider: Myrtie Hawk Other Clinician: Referring Provider: Referral, Self Treating  Provider/Extender: STONE III, Ho Parisi Weeks in Treatment: 0 Verbal / Phone Orders: No Diagnosis Coding ICD-10 Coding Code Description E11.622 Type 2 diabetes mellitus with other  skin ulcer I87.2 Venous insufficiency (chronic) (peripheral) L97.822 Non-pressure chronic ulcer of other part of left lower leg with fat layer exposed L97.812 Non-pressure chronic ulcer of other part of right lower leg with fat layer exposed I10 Essential (primary) hypertension J44.9 Chronic obstructive pulmonary disease, unspecified Wound Cleansing Wound #1 Left,Anterior Lower Leg o Cleanse wound with mild soap and water o May shower with protection. - Please do not get your wraps wet Wound #2 Left,Medial,Posterior Lower Leg o Cleanse wound with mild soap and water o May shower with protection. - Please do not get your wraps wet Wound #3 Left,Distal,Medial Lower Leg o Cleanse wound with mild soap and water o May shower with protection. - Please do not get your wraps wet Wound #4 Right,Medial Lower Leg o Cleanse wound with mild soap and water o May shower with protection. - Please do not get your wraps wet Wound #5 Right,Anterior Lower Leg o Cleanse wound with mild soap and water o May shower with protection. - Please do not get your wraps wet Primary Wound Dressing Wound #1 Left,Anterior Lower Leg o Silver Alginate Wound #2 Left,Medial,Posterior Lower Leg o Silver Alginate Wound #3 Left,Distal,Medial Lower Leg o Silver Alginate Wound #4 Right,Medial Lower Leg o Silver Alginate [name], Juliona M. ([id number]) Wound #5 Right,Anterior Lower Leg o Silver Alginate Secondary Dressing Wound #1 Left,Anterior Lower Leg o ABD pad Wound #2 Left,Medial,Posterior Lower Leg o ABD pad Wound #3 Left,Distal,Medial Lower Leg o ABD pad Wound #4 Right,Medial Lower Leg o ABD pad Wound #5 Right,Anterior Lower Leg o ABD pad Dressing Change Frequency o Other: - Twice  Weekly - Friday and Tuesday Follow-up Appointments o Return Appointment in 1 week. o Nurse Visit as needed - Monday or Tuesday Edema Control o 3 Layer Compression System - Bilateral o Compression Pump: Use compression pump on left lower extremity for 60 minutes, twice daily. o Compression Pump: Use compression pump on right lower extremity for 60 minutes, twice daily. Electronic Signature(s) Signed: 10/19/2018 4:48:17 PM By: Montey Hora Signed: 10/19/2018 4:53:08 PM By: Worthy Keeler PA-C Entered By: Montey Hora on 10/19/2018 14:39:02 [name], [name] ([id number]) -------------------------------------------------------------------------------- Problem List Details Patient Name: [name], Jezabelle M. Date of Service: 10/19/2018 2:15 PM Medical Record Number: [id number] Patient Account Number: [id number] Date of Birth/Sex: 05-Nov-1924 (85 y.o. F) Treating RN: Montey Hora Primary Care Provider: Myrtie Hawk Other Clinician: Referring Provider: Referral, Self Treating Provider/Extender: Melburn Hake, Sasan Wilkie Weeks in Treatment: 0 Active Problems ICD-10 Evaluated Encounter Code Description Active Date Today Diagnosis E11.622 Type 2 diabetes mellitus with other skin ulcer 10/19/2018 No Yes I89.0 Lymphedema, not elsewhere classified 10/19/2018 No Yes I87.2 Venous insufficiency (chronic) (peripheral) 10/19/2018 No Yes L97.822 Non-pressure chronic ulcer of other part of left lower leg with 10/19/2018 No Yes fat layer exposed L97.812 Non-pressure chronic ulcer of other part of right lower leg 10/19/2018 No Yes with fat layer exposed I10 Essential (primary) hypertension 10/19/2018 No Yes J44.9 Chronic obstructive pulmonary disease, unspecified 10/19/2018 No Yes Inactive Problems Resolved Problems Electronic Signature(s) Signed: 10/19/2018 3:11:15 PM By: Worthy Keeler PA-C Previous Signature: 10/19/2018 2:22:36 PM Version By: Worthy Keeler PA-C Entered By:  Worthy Keeler on 10/19/2018 15:11:14 [place] ([id number]) -------------------------------------------------------------------------------- Progress Note Details Patient Name: [name], Dagny M. Date of Service: 10/19/2018 2:15 PM Medical Record Number: [id number] Patient Account Number: [id number] Date of Birth/Sex: Dec 26, 1924 (85 y.o. F) Treating RN: Montey Hora Primary Care Provider: Myrtie Hawk Other Clinician: Referring Provider: Referral, Self  Treating Provider/Extender: STONE III, Tiffancy Moger Weeks in Treatment: 0 Subjective Chief Complaint Information obtained from Patient Bilateral LE lymphedema History of Present Illness (HPI) 04/17/18 on evaluation today patient presents for initial inspection in our clinic concerning issues that she has been having with her gluteal region. Things actually appear to be doing much better at this point although we could go her daughter tells me that the patient was actually having areas that were draining in the gluteal region bilaterally. Fortunately these regions appear to have resolved. Still there is evidence of pressure injury to the gluteal region she has blanchable erythema but decreased capillary refill to the area which again is an early sign of pressure. Again I do think that her daily activity may be the cause of such. She does have a history of diabetes with a hemoglobin A1c of 7.8 on 04/11/18. She also has gout and hypertension. The patient does get around in her home according to what her daughter tells me. However she spends most of her day and actually her night as well in her recliner which I think is likely where the pressure is coming from. She tells me that she doesn't sleep in a bed and pretty much unless she's getting up to use the bathroom or eat she is in her recliner. This again I think is likely the biggest issue that we're seeing right now for what's causing the pressure to her gluteal  region. 08/02/18 on evaluation today patient actually presents for reevaluation in clinic concerning issues that she's been having with her gluteal region which the good news is appears to be completely healed at this time. There's no open wound currently. She did have bilateral lower extremity lymphedema which was starting to week more on the right lower extremity especially. With that being said this seems to be also doing better in resolving she's not wearing the compression stockings of I think she really is that compression stockings pretty much all the time. She does have TBI's that were performed and finalized on 04/30/18 they appear to be doing well with a right TBI of 0.70 in the left TBI of 0.98. Overall I feel like the patient is actually doing quite well although we do need to have some things that we go through with them as far as teaching is concerned in order to prevent issues from showing up in the future. No fevers, chills, nausea, or vomiting noted at this time. 10/19/2018 upon evaluation today patient presents for reevaluation our clinic concerning issues that she is actually having with her bilateral lower extremities. I have seen her a couple times previous where we have discussed a wound on her gluteal region which actually was not giving her any trouble technically the last time I saw her. She is always had issues with her legs and swelling she does have lymphedema pumps. In fact by some crazy coincidence she actually ended up having them ordered from 2 different locations all likely from the congestive heart failure clinic as well as vascular and ended up with 2 sets of lymphedema pumps. Nonetheless 1 of them she got covered by insurance the other they had to pay $700 for. Obviously I am not exactly sure what is going on in this regard but she obviously does not need to have 2 sets of pumps. Fortunately there are no signs of infection in regard to patient's bilateral lower  extremities. She has been tolerating the dressing changes without complication. No fevers, chills, nausea, vomiting, or diarrhea.  She was wearing compression stockings for some time until her leg started to weep. She was also using the lymphedema pumps according to her daughter who was present during the visit today until she started having issues with the weeping as well and then they were sure that she continue to use them. Patient History Information obtained from Patient. Allergies metformin (Severity: Moderate, Reaction: nausea) Family History SHOFFNER REETA, KUK. ([id number]) Cancer - Child, Diabetes - Siblings, Heart Disease - Father,Child, Hypertension - Child, Thyroid Problems - Siblings, No family history of Kidney Disease, Lung Disease, Seizures, Tuberculosis. Social History Never smoker, Marital Status - Widowed, Alcohol Use - Never, Drug Use - No History, Caffeine Use - Moderate. Medical History Eyes Patient has history of Cataracts - removed, Glaucoma Denies history of Optic Neuritis Ear/Nose/Mouth/Throat Denies history of Chronic sinus problems/congestion, Middle ear problems Hematologic/Lymphatic Denies history of Anemia, Hemophilia, Human Immunodeficiency Virus, Lymphedema, Sickle Cell Disease Respiratory Denies history of Aspiration, Asthma, Chronic Obstructive Pulmonary Disease (COPD), Pneumothorax, Sleep Apnea, Tuberculosis Cardiovascular Patient has history of Arrhythmia, Congestive Heart Failure, Coronary Artery Disease, Hypertension Denies history of Angina, Hypotension, Myocardial Infarction, Peripheral Arterial Disease, Peripheral Venous Disease, Phlebitis, Vasculitis Gastrointestinal Denies history of Cirrhosis , Colitis, Crohn s, Hepatitis A, Hepatitis B, Hepatitis C Endocrine Patient has history of Type II Diabetes Denies history of Type I Diabetes Genitourinary Denies history of End Stage Renal Disease Immunological Denies history of Lupus  Erythematosus, Raynaud s, Scleroderma Integumentary (Skin) Denies history of History of Burn, History of pressure wounds Musculoskeletal Patient has history of Gout, Osteoarthritis - bilateral knees Denies history of Rheumatoid Arthritis, Osteomyelitis Neurologic Denies history of Dementia, Neuropathy, Quadriplegia, Paraplegia, Seizure Disorder Oncologic Denies history of Received Chemotherapy, Received Radiation Psychiatric Denies history of Anorexia/bulimia, Confinement Anxiety Hospitalization/Surgery History - Heart valve replacement. Review of Systems (ROS) Eyes Denies complaints or symptoms of Dry Eyes, Vision Changes, Glasses / Contacts. Ear/Nose/Mouth/Throat Denies complaints or symptoms of Difficult clearing ears, Sinusitis. Hematologic/Lymphatic Denies complaints or symptoms of Bleeding / Clotting Disorders, Human Immunodeficiency Virus. Respiratory Denies complaints or symptoms of Chronic or frequent coughs, Shortness of Breath. Cardiovascular Denies complaints or symptoms of Chest pain, LE edema. Gastrointestinal Denies complaints or symptoms of Frequent diarrhea, Nausea, Vomiting. Endocrine Denies complaints or symptoms of Hepatitis, Thyroid disease, Polydypsia (Excessive Thirst). [place] ([id number]) Denies complaints or symptoms of Kidney failure/ Dialysis, Incontinence/dribbling. Immunological Denies complaints or symptoms of Hives, Itching. Integumentary (Skin) Denies complaints or symptoms of Wounds, Bleeding or bruising tendency, Breakdown, Swelling. Musculoskeletal Denies complaints or symptoms of Muscle Pain, Muscle Weakness. Neurologic Denies complaints or symptoms of Numbness/parasthesias, Focal/Weakness. Psychiatric Denies complaints or symptoms of Anxiety, Claustrophobia. Objective Constitutional patient is hypertensive.. pulse regular and within target range for patient.Marland Kitchen respirations regular, non-labored and within  target range for patient.Marland Kitchen temperature within target range for patient.. Well-nourished and well-hydrated in no acute distress. Vitals Time Taken: 2:00 PM, Height: 60 in, Source: Stated, Weight: 190 lbs, Source: Stated, BMI: 37.1, Temperature: 98.1 F, Pulse: 72 bpm, Respiratory Rate: 16 breaths/min, Blood Pressure: 165/76 mmHg. Eyes conjunctiva clear no eyelid edema noted. pupils equal round and reactive to light and accommodation. Ears, Nose, Mouth, and Throat no gross abnormality of ear auricles or external auditory canals. normal hearing noted during conversation. mucus membranes moist. Respiratory normal breathing without difficulty. clear to auscultation bilaterally. Cardiovascular regular rate and rhythm with normal S1, S2. 1+ dorsalis pedis/posterior tibialis pulses. 1+ pitting edema of the bilateral lower extremities. Gastrointestinal (GI) soft, non-tender, non-distended, +BS. no  ventral hernia noted. Musculoskeletal Patient unable to walk without assistance. no significant deformity or arthritic changes, no loss or range of motion, no clubbing. Psychiatric this patient is able to make decisions and demonstrates good insight into disease process. Alert and Oriented x 3. pleasant and cooperative. General Notes: Upon inspection today patient has multiple wounds noted over the bilateral lower extremities at this time. None of these areas appear to be infected which is good news. With that being said she does have several areas of opening which are weeping some worse than others. The largest on the right is actually on the medial part of the leg the largest on the [place] ([id number]) left are really 3 similarly sized wound scattered over the anterior lower extremity. Integumentary (Hair, Skin) Wound #1 status is Open. Original cause of wound was Blister. The wound is located on the Left,Anterior Lower Leg. The wound measures 1.2cm length x 1.7cm width x 0.1cm  depth; 1.602cm^2 area and 0.16cm^3 volume. There is Fat Layer (Subcutaneous Tissue) Exposed exposed. There is no tunneling or undermining noted. There is a medium amount of serosanguineous drainage noted. There is large (67-100%) red granulation within the wound bed. There is a small (1-33%) amount of necrotic tissue within the wound bed including Adherent Slough. Wound #2 status is Open. Original cause of wound was Blister. The wound is located on the Left,Medial,Posterior Lower Leg. The wound measures 1.4cm length x 1.6cm width x 0.1cm depth; 1.759cm^2 area and 0.176cm^3 volume. There is Fat Layer (Subcutaneous Tissue) Exposed exposed. There is no tunneling or undermining noted. There is a medium amount of serosanguineous drainage noted. There is large (67-100%) red granulation within the wound bed. There is a small (1-33%) amount of necrotic tissue within the wound bed including Adherent Slough. Wound #3 status is Open. Original cause of wound was Blister. The wound is located on the Left,Distal,Medial Lower Leg. The wound measures 0.7cm length x 0.7cm width x 0.1cm depth; 0.385cm^2 area and 0.038cm^3 volume. There is Fat Layer (Subcutaneous Tissue) Exposed exposed. There is no tunneling or undermining noted. There is a medium amount of serosanguineous drainage noted. There is large (67-100%) red granulation within the wound bed. There is a small (1-33%) amount of necrotic tissue within the wound bed including Adherent Slough. Wound #4 status is Open. Original cause of wound was Blister. The wound is located on the Right,Medial Lower Leg. The wound measures 2.6cm length x 3cm width x 0.1cm depth; 6.126cm^2 area and 0.613cm^3 volume. There is Fat Layer (Subcutaneous Tissue) Exposed exposed. There is no tunneling or undermining noted. There is a medium amount of sanguinous drainage noted. There is large (67-100%) red granulation within the wound bed. There is a small (1-33%) amount of necrotic  tissue within the wound bed including Adherent Slough. Wound #5 status is Open. Original cause of wound was Blister. The wound is located on the Right,Anterior Lower Leg. The wound measures 0.9cm length x 0.7cm width x 0.1cm depth; 0.495cm^2 area and 0.049cm^3 volume. There is Fat Layer (Subcutaneous Tissue) Exposed exposed. There is no tunneling or undermining noted. There is a medium amount of serosanguineous drainage noted. There is large (67-100%) red granulation within the wound bed. There is a small (1-33%) amount of necrotic tissue within the wound bed including Adherent Slough. Assessment Active Problems ICD-10 Type 2 diabetes mellitus with other skin ulcer Lymphedema, not elsewhere classified Venous insufficiency (chronic) (peripheral) Non-pressure chronic ulcer of other part of left lower leg with fat layer exposed  Non-pressure chronic ulcer of other part of right lower leg with fat layer exposed Essential (primary) hypertension Chronic obstructive pulmonary disease, unspecified Procedures Wound #1 Pre-procedure diagnosis of Wound #1 is a Diabetic Wound/Ulcer of the Lower Extremity located on the [place] ([id number]) Leg . There was a Three Layer Compression Therapy Procedure with a pre-treatment ABI of 0.7 by Montey Hora, RN. Post procedure Diagnosis Wound #1: Same as Pre-Procedure Notes: ABI 04/30/18 [place] Bilateral and TBI L .98 and R .7. Wound #2 Pre-procedure diagnosis of Wound #2 is a Diabetic Wound/Ulcer of the Lower Extremity located on the Left,Medial,Posterior Lower Leg . There was a Three Layer Compression Therapy Procedure with a pre-treatment ABI of 0.7 by Montey Hora, RN. Post procedure Diagnosis Wound #2: Same as Pre-Procedure Notes: ABI 04/30/18 [place] Bilateral and TBI L .98 and R .7. Wound #3 Pre-procedure diagnosis of Wound #3 is a Diabetic Wound/Ulcer of the Lower Extremity located on the Left,Distal,Medial Lower Leg .  There was a Three Layer Compression Therapy Procedure with a pre-treatment ABI of 0.7 by Montey Hora, RN. Post procedure Diagnosis Wound #3: Same as Pre-Procedure Notes: ABI 04/30/18 [place] Bilateral and TBI L .98 and R .7. Wound #4 Pre-procedure diagnosis of Wound #4 is a Diabetic Wound/Ulcer of the Lower Extremity located on the Right,Medial Lower Leg . There was a Three Layer Compression Therapy Procedure with a pre-treatment ABI of 0.7 by Montey Hora, RN. Post procedure Diagnosis Wound #4: Same as Pre-Procedure Notes: ABI 04/30/18 [place] Bilateral and TBI L .98 and R .7. Wound #5 Pre-procedure diagnosis of Wound #5 is a Diabetic Wound/Ulcer of the Lower Extremity located on the Right,Anterior Lower Leg . There was a Three Layer Compression Therapy Procedure with a pre-treatment ABI of 0.7 by Montey Hora, RN. Post procedure Diagnosis Wound #5: Same as Pre-Procedure Notes: ABI 04/30/18 [place] Bilateral and TBI L .98 and R .7. Plan Wound Cleansing: Wound #1 Left,Anterior Lower Leg: Cleanse wound with mild soap and water May shower with protection. - Please do not get your wraps wet Wound #2 Left,Medial,Posterior Lower Leg: Cleanse wound with mild soap and water May shower with protection. - Please do not get your wraps wet Wound #3 Left,Distal,Medial Lower Leg: Cleanse wound with mild soap and water May shower with protection. - Please do not get your wraps wet Wound #4 Right,Medial Lower Leg: Cleanse wound with mild soap and water May shower with protection. - Please do not get your wraps wet Wound #5 Right,Anterior Lower Leg: Cleanse wound with mild soap and water May shower with protection. - Please do not get your wraps wet Primary Wound Dressing: Wound #1 Left,Anterior Lower Leg: Silver Alginate Wound #2 Left,Medial,Posterior Lower Leg: Silver Alginate Wound #3 Left,Distal,Medial Lower Leg: Silver Alginate [name], Vauda M. ([id number]) Wound #4 Right,Medial Lower  Leg: Silver Alginate Wound #5 Right,Anterior Lower Leg: Silver Alginate Secondary Dressing: Wound #1 Left,Anterior Lower Leg: ABD pad Wound #2 Left,Medial,Posterior Lower Leg: ABD pad Wound #3 Left,Distal,Medial Lower Leg: ABD pad Wound #4 Right,Medial Lower Leg: ABD pad Wound #5 Right,Anterior Lower Leg: ABD pad Dressing Change Frequency: Other: - Twice Weekly - Friday and Tuesday Follow-up Appointments: Return Appointment in 1 week. Nurse Visit as needed - Monday or Tuesday Edema Control: 3 Layer Compression System - Bilateral Compression Pump: Use compression pump on left lower extremity for 60 minutes, twice daily. Compression Pump: Use compression pump on right lower extremity for 60 minutes, twice daily. 1. I would recommend that  we go ahead and initiate treatment with a silver alginate dressing I think this will be beneficial for the patient. She is in agreement with this plan. 2. I would recommend that we initiate a 3 layer compression wrap for her as well I think this will do well to keep her swelling under control obviously that is a big part of what is needed at this point in order to help her to heal appropriately. 3. I am going to suggest as well that she elevate her legs as often as possible I think this too can be beneficial in helping the wounds to heal appropriately. 4. I also suggest that she use her lymphedema pumps 1 hour 2 times a day on a daily basis. I explained that this can be done over top of the compression wraps with no issues. We will see patient back for reevaluation in 1 week here in the clinic. If anything worsens or changes patient will contact our office for additional recommendations. Electronic Signature(s) Signed: 10/19/2018 3:12:00 PM By: Worthy Keeler PA-C Entered By: Worthy Keeler on 10/19/2018 15:11:59 [place] ([id number]) -------------------------------------------------------------------------------- ROS/PFSH  Details Patient Name: [name], Cassandra M. Date of Service: 10/19/2018 2:15 PM Medical Record Number: [id number] Patient Account Number: [id number] Date of Birth/Sex: 1924-08-01 (85 y.o. F) Treating RN: Army Melia Primary Care Provider: Myrtie Hawk Other Clinician: Referring Provider: Referral, Self Treating Provider/Extender: STONE III, Keilyn Haggard Weeks in Treatment: 0 Information Obtained From Patient Eyes Complaints and Symptoms: Negative for: Dry Eyes; Vision Changes; Glasses / Contacts Medical History: Positive for: Cataracts - removed; Glaucoma Negative for: Optic Neuritis Ear/Nose/Mouth/Throat Complaints and Symptoms: Negative for: Difficult clearing ears; Sinusitis Medical History: Negative for: Chronic sinus problems/congestion; Middle ear problems Hematologic/Lymphatic Complaints and Symptoms: Negative for: Bleeding / Clotting Disorders; Human Immunodeficiency Virus Medical History: Negative for: Anemia; Hemophilia; Human Immunodeficiency Virus; Lymphedema; Sickle Cell Disease Respiratory Complaints and Symptoms: Negative for: Chronic or frequent coughs; Shortness of Breath Medical History: Negative for: Aspiration; Asthma; Chronic Obstructive Pulmonary Disease (COPD); Pneumothorax; Sleep Apnea; Tuberculosis Cardiovascular Complaints and Symptoms: Negative for: Chest pain; LE edema Medical History: Positive for: Arrhythmia; Congestive Heart Failure; Coronary Artery Disease; Hypertension Negative for: Angina; Hypotension; Myocardial Infarction; Peripheral Arterial Disease; Peripheral Venous Disease; Phlebitis; Vasculitis Gastrointestinal Complaints and Symptoms: Negative for: Frequent diarrhea; Nausea; Vomiting SHOFFNER KENDRIA, HALBERG. ([id number]) Medical History: Negative for: Cirrhosis ; Colitis; Crohnos; Hepatitis A; Hepatitis B; Hepatitis C Endocrine Complaints and Symptoms: Negative for: Hepatitis; Thyroid disease; Polydypsia (Excessive  Thirst) Medical History: Positive for: Type II Diabetes Negative for: Type I Diabetes Time with diabetes: 20 years Treated with: Oral agents Blood sugar tested every day: Yes Tested : twice daily Genitourinary Complaints and Symptoms: Negative for: Kidney failure/ Dialysis; Incontinence/dribbling Medical History: Negative for: End Stage Renal Disease Immunological Complaints and Symptoms: Negative for: Hives; Itching Medical History: Negative for: Lupus Erythematosus; Raynaudos; Scleroderma Integumentary (Skin) Complaints and Symptoms: Negative for: Wounds; Bleeding or bruising tendency; Breakdown; Swelling Medical History: Negative for: History of Burn; History of pressure wounds Musculoskeletal Complaints and Symptoms: Negative for: Muscle Pain; Muscle Weakness Medical History: Positive for: Gout; Osteoarthritis - bilateral knees Negative for: Rheumatoid Arthritis; Osteomyelitis Neurologic Complaints and Symptoms: Negative for: Numbness/parasthesias; Focal/Weakness Medical History: Negative for: Dementia; Neuropathy; Quadriplegia; Paraplegia; Seizure Disorder Psychiatric SHOFFNER CHASLYN, EISEN ([id number]) Complaints and Symptoms: Negative for: Anxiety; Claustrophobia Medical History: Negative for: Anorexia/bulimia; Confinement Anxiety Oncologic Medical History: Negative for: Received Chemotherapy; Received Radiation HBO Extended History Items Eyes: Eyes: Cataracts Glaucoma Immunizations  Pneumococcal Vaccine: Received Pneumococcal Vaccination: Yes Implantable Devices None Hospitalization / Surgery History Type of Hospitalization/Surgery Heart valve replacement Family and Social History Cancer: Yes - Child; Diabetes: Yes - Siblings; Heart Disease: Yes - Father,Child; Hypertension: Yes - Child; Kidney Disease: No; Lung Disease: No; Seizures: No; Thyroid Problems: Yes - Siblings; Tuberculosis: No; Never smoker; Marital Status - Widowed; Alcohol Use: Never;  Drug Use: No History; Caffeine Use: Moderate Electronic Signature(s) Signed: 10/19/2018 4:20:53 PM By: Army Melia Signed: 10/19/2018 4:53:08 PM By: Worthy Keeler PA-C Entered By: Army Melia on 10/19/2018 14:02:57 [place], [name] ([id number]) -------------------------------------------------------------------------------- SuperBill Details Patient Name: [name], Cassandra M. Date of Service: 10/19/2018 Medical Record Number: [id number] Patient Account Number: [id number] Date of Birth/Sex: 30-Apr-1924 (85 y.o. F) Treating RN: Montey Hora Primary Care Provider: Myrtie Hawk Other Clinician: Referring Provider: Referral, Self Treating Provider/Extender: Melburn Hake, Corine Solorio Weeks in Treatment: 0 Diagnosis Coding ICD-10 Codes Code Description E11.622 Type 2 diabetes mellitus with other skin ulcer I89.0 Lymphedema, not elsewhere classified I87.2 Venous insufficiency (chronic) (peripheral) L97.822 Non-pressure chronic ulcer of other part of left lower leg with fat layer exposed L97.812 Non-pressure chronic ulcer of other part of right lower leg with fat layer exposed I10 Essential (primary) hypertension J44.9 Chronic obstructive pulmonary disease, unspecified Facility Procedures CPT4 Code: [id number] Description: 99213 - WOUND CARE VISIT-LEV 3 EST PT Modifier: Quantity: 1 Physician Procedures CPT4 Code Description: [id number] 96438 - WC PHYS LEVEL 4 - EST PT ICD-10 Diagnosis Description E11.622 Type 2 diabetes mellitus with other skin ulcer I89.0 Lymphedema, not elsewhere classified I87.2 Venous insufficiency (chronic) (peripheral) L97.822  Non-pressure chronic ulcer of other part of left lower leg wit Modifier: h fat layer expos Quantity: 1 ed Electronic Signature(s) Signed: 10/19/2018 3:17:08 PM By: Worthy Keeler PA-C Entered By: Worthy Keeler on 10/19/2018 15:17:08

## 2018-10-19 NOTE — Progress Notes (Signed)
SHOFFNER NIASHA, DEVINS ([id number]) Visit Report for 10/19/2018 Allergy List Details Patient Name: [name], [name]. Date of Service: 10/19/2018 2:15 PM Medical Record Number: [id number] Patient Account Number: [id number] Date of Birth/Sex: 1924/11/29 (85 y.o. F) Treating RN: Army Melia Primary Care Taleigh Gero: Myrtie Hawk Other Clinician: Referring Heavenleigh Petruzzi: Referral, Self Treating Burk Hoctor/Extender: STONE III, HOYT Weeks in Treatment: 0 Allergies Active Allergies metformin Reaction: nausea Severity: Moderate Allergy Notes Electronic Signature(s) Signed: 10/19/2018 4:20:53 PM By: Army Melia Entered By: Army Melia on 10/19/2018 14:01:43 Rattan, [name] ([id number]) -------------------------------------------------------------------------------- Arrival Information Details Patient Name: [name], Cassandra M. Date of Service: 10/19/2018 2:15 PM Medical Record Number: [id number] Patient Account Number: [id number] Date of Birth/Sex: [birth year] (85 y.o. F) Treating RN: Army Melia Primary Care Bert Ptacek: Myrtie Hawk Other Clinician: Referring Harjas Biggins: Referral, Self Treating Dorie Ohms/Extender: Melburn Hake, HOYT Weeks in Treatment: 0 Visit Information Patient Arrived: Wheel Chair Arrival Time: 13:59 Accompanied By: daughter Transfer Assistance: Manual Patient Has Alerts: Yes Patient Alerts: ABI 04/30/18 [place] TBI L .70 R .98 DMII History Since Last Visit Added or deleted any medications: No Any new allergies or adverse reactions: No Had a fall or experienced change in activities of daily living that may affect risk of falls: No Signs or symptoms of abuse/neglect since last visito No Hospitalized since last visit: No Has Dressing in Place as Prescribed: Yes Electronic Signature(s) Signed: 10/19/2018 4:48:17 PM By: Montey Hora Previous Signature: 10/19/2018 2:19:08 PM Version By: Army Melia Entered By: Montey Hora on  10/19/2018 14:29:01 [name], [name] ([id number]) -------------------------------------------------------------------------------- Clinic Level of Care Assessment Details Patient Name: [name], Cassandra M. Date of Service: 10/19/2018 2:15 PM Medical Record Number: [id number] Patient Account Number: [id number] Date of Birth/Sex: 08/20/1924 (85 y.o. F) Treating RN: Montey Hora Primary Care Aubert Choyce: Myrtie Hawk Other Clinician: Referring Johnni Wunschel: Referral, Self Treating [name]/Extender: STONE III, HOYT Weeks in Treatment: 0 Clinic Level of Care Assessment Items TOOL 1 Quantity Score []  - Use when EandM and Procedure is performed on INITIAL visit 0 ASSESSMENTS - Nursing Assessment / Reassessment X - General Physical Exam (combine w/ comprehensive assessment (listed just below) when 1 20 performed on new pt. evals) X- 1 25 Comprehensive Assessment (HX, ROS, Risk Assessments, Wounds Hx, etc.) ASSESSMENTS - Wound and Skin Assessment / Reassessment []  - Dermatologic / Skin Assessment (not related to wound area) 0 ASSESSMENTS - Ostomy and/or Continence Assessment and Care []  - Incontinence Assessment and Management 0 []  - 0 Ostomy Care Assessment and Management (repouching, etc.) PROCESS - Coordination of Care X - Simple Patient / Family Education for ongoing care 1 15 []  - 0 Complex (extensive) Patient / Family Education for ongoing care X- 1 10 Staff obtains Programmer, systems, Records, Test Results / Process Orders []  - 0 Staff telephones HHA, Nursing Homes / Clarify orders / etc []  - 0 Routine Transfer to another Facility (non-emergent condition) []  - 0 Routine Hospital Admission (non-emergent condition) X- 1 15 New Admissions / Biomedical engineer / Ordering NPWT, Apligraf, etc. []  - 0 Emergency Hospital Admission (emergent condition) PROCESS - Special Needs []  - Pediatric / Minor Patient Management 0 []  - 0 Isolation Patient Management []  -  0 Hearing / Language / Visual special needs []  - 0 Assessment of Community assistance (transportation, D/C planning, etc.) []  - 0 Additional assistance / Altered mentation []  - 0 Support Surface(s) Assessment (bed, cushion, seat, etc.) [name], Analyah M. ([id number]) INTERVENTIONS - Miscellaneous []  - External ear exam 0 []  -  0 Patient Transfer (multiple staff / Civil Service fast streamer / Similar devices) []  - 0 Simple Staple / Suture removal (25 or less) []  - 0 Complex Staple / Suture removal (26 or more) []  - 0 Hypo/Hyperglycemic Management (do not check if billed separately) []  - 0 Ankle / Brachial Index (ABI) - do not check if billed separately Has the patient been seen at the hospital within the last three years: Yes Total Score: 85 Level Of Care: New/Established - Level 3 Electronic Signature(s) Signed: 10/19/2018 4:48:17 PM By: Montey Hora Entered By: Montey Hora on 10/19/2018 14:40:39 [name], [name] ([id number]) -------------------------------------------------------------------------------- Compression Therapy Details Patient Name: [name], Cassandra M. Date of Service: 10/19/2018 2:15 PM Medical Record Number: [id number] Patient Account Number: [id number] Date of Birth/Sex: 1925-02-07 (85 y.o. F) Treating RN: Montey Hora Primary Care Cyani Kallstrom: Myrtie Hawk Other Clinician: Referring Khali Perella: Referral, Self Treating Elienai Gailey/Extender: STONE III, HOYT Weeks in Treatment: 0 Compression Therapy Performed for Wound Assessment: Wound #1 Left,Anterior Lower Leg Performed By: Clinician Montey Hora, RN Compression Type: Three Layer Pre Treatment ABI: 0.7 Post Procedure Diagnosis Same as Pre-procedure Notes ABI 04/30/18 [place] Bilateral and TBI L .98 and R .7 Electronic Signature(s) Signed: 10/19/2018 4:48:17 PM By: Montey Hora Entered By: Montey Hora on 10/19/2018 14:36:45 [place], [name]  ([id number]) -------------------------------------------------------------------------------- Compression Therapy Details Patient Name: [name], [name] Date of Service: 10/19/2018 2:15 PM Medical Record Number: [id number] Patient Account Number: [id number] Date of Birth/Sex: 30-Nov-1924 (85 y.o. F) Treating RN: Montey Hora Primary Care Kalvyn Desa: Myrtie Hawk Other Clinician: Referring Aubrianne Molyneux: Referral, Self Treating Blaise Grieshaber/Extender: STONE III, HOYT Weeks in Treatment: 0 Compression Therapy Performed for Wound Assessment: Wound #2 Left,Medial,Posterior Lower Leg Performed By: Clinician Montey Hora, RN Compression Type: Three Layer Pre Treatment ABI: 0.7 Post Procedure Diagnosis Same as Pre-procedure Notes ABI 04/30/18 [place] Bilateral and TBI L .98 and R .7 Electronic Signature(s) Signed: 10/19/2018 4:48:17 PM By: Montey Hora Entered By: Montey Hora on 10/19/2018 14:36:45 [place], [name] ([id number]) -------------------------------------------------------------------------------- Compression Therapy Details Patient Name: [name], Katelyne M. Date of Service: 10/19/2018 2:15 PM Medical Record Number: [id number] Patient Account Number: [id number] Date of Birth/Sex: 08-08-1924 (85 y.o. F) Treating RN: Montey Hora Primary Care Khaya Theissen: Myrtie Hawk Other Clinician: Referring Silverio Hagan: Referral, Self Treating Christoph Copelan/Extender: STONE III, HOYT Weeks in Treatment: 0 Compression Therapy Performed for Wound Assessment: Wound #3 Left,Distal,Medial Lower Leg Performed By: Clinician Montey Hora, RN Compression Type: Three Layer Pre Treatment ABI: 0.7 Post Procedure Diagnosis Same as Pre-procedure Notes ABI 04/30/18 [place] Bilateral and TBI L .98 and R .7 Electronic Signature(s) Signed: 10/19/2018 4:48:17 PM By: Montey Hora Entered By: Montey Hora on 10/19/2018 14:36:45 [place], [name]  ([id number]) -------------------------------------------------------------------------------- Compression Therapy Details Patient Name: [name], Yatzil M. Date of Service: 10/19/2018 2:15 PM Medical Record Number: [id number] Patient Account Number: [id number] Date of Birth/Sex: [birth year] (85 y.o. F) Treating RN: Montey Hora Primary Care Abrar Koone: Myrtie Hawk Other Clinician: Referring Arine Foley: Referral, Self Treating Tomasita Beevers/Extender: STONE III, HOYT Weeks in Treatment: 0 Compression Therapy Performed for Wound Assessment: Wound #4 Right,Medial Lower Leg Performed By: Clinician Montey Hora, RN Compression Type: Three Layer Pre Treatment ABI: 0.7 Post Procedure Diagnosis Same as Pre-procedure Notes ABI 04/30/18 [place] Bilateral and TBI L .98 and R .7 Electronic Signature(s) Signed: 10/19/2018 4:48:17 PM By: Montey Hora Entered By: Montey Hora on 10/19/2018 14:36:45 [place], [name] ([id number]) -------------------------------------------------------------------------------- Compression Therapy Details Patient Name: [name], Burna M. Date of Service: 10/19/2018 2:15 PM Medical Record  Number: [id number] Patient Account Number: [id number] Date of Birth/Sex: 05/11/1924 (85 y.o. F) Treating RN: Montey Hora Primary Care Yisel Megill: Myrtie Hawk Other Clinician: Referring Bianco Cange: Referral, Self Treating Monchel Pollitt/Extender: STONE III, HOYT Weeks in Treatment: 0 Compression Therapy Performed for Wound Assessment: Wound #5 Right,Anterior Lower Leg Performed By: Clinician Montey Hora, RN Compression Type: Three Layer Pre Treatment ABI: 0.7 Post Procedure Diagnosis Same as Pre-procedure Notes ABI 04/30/18 [place] Bilateral and TBI L .98 and R .7 Electronic Signature(s) Signed: 10/19/2018 4:48:17 PM By: Montey Hora Entered By: Montey Hora on 10/19/2018 14:36:46 [place], [name]  ([id number]) -------------------------------------------------------------------------------- Encounter Discharge Information Details Patient Name: [name], Conya M. Date of Service: 10/19/2018 2:15 PM Medical Record Number: [id number] Patient Account Number: [id number] Date of Birth/Sex: 10-08-1924 (85 y.o. F) Treating RN: Montey Hora Primary Care Maan Zarcone: Myrtie Hawk Other Clinician: Referring Lauryl Seyer: Referral, Self Treating Chea Malan/Extender: Melburn Hake, HOYT Weeks in Treatment: 0 Encounter Discharge Information Items Discharge Condition: Stable Ambulatory Status: Wheelchair Discharge Destination: Home Transportation: Private Auto Accompanied By: daughter Schedule Follow-up Appointment: Yes Clinical Summary of Care: Electronic Signature(s) Signed: 10/19/2018 4:48:17 PM By: Montey Hora Entered By: Montey Hora on 10/19/2018 14:41:52 [name], [name] ([id number]) -------------------------------------------------------------------------------- Lower Extremity Assessment Details Patient Name: [name], Elner M. Date of Service: 10/19/2018 2:15 PM Medical Record Number: [id number] Patient Account Number: [id number] Date of Birth/Sex: [birth year] (85 y.o. F) Treating RN: Army Melia Primary Care Moncia Annas: Myrtie Hawk Other Clinician: Referring Solenne Manwarren: Referral, Self Treating Lamis Behrmann/Extender: STONE III, HOYT Weeks in Treatment: 0 Edema Assessment Assessed: [Left: No] [Right: No] [Left: Edema] [Right: :] Calf Left: Right: Point of Measurement: 39 cm From Medial Instep 32 cm 37 cm Ankle Left: Right: Point of Measurement: 27 cm From Medial Instep 10 cm 10 cm Vascular Assessment Pulses: Dorsalis Pedis Palpable: [Left:Yes] [Right:Yes] Electronic Signature(s) Signed: 10/19/2018 4:20:53 PM By: Army Melia Entered By: Army Melia on 10/19/2018 14:17:11 [place], [name]  ([id number]) -------------------------------------------------------------------------------- Multi Wound Chart Details Patient Name: [name], Jamieka M. Date of Service: 10/19/2018 2:15 PM Medical Record Number: [id number] Patient Account Number: [id number] Date of Birth/Sex: [birth year] (85 y.o. 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 Lower Extremity Lower Extremity Comorbid History: Cataracts, Glaucoma, Cataracts, Glaucoma, Cataracts, Glaucoma, Arrhythmia, Congestive Heart Arrhythmia, Congestive Heart Arrhythmia, Congestive Heart Failure, Coronary Artery Failure, Coronary Artery Failure, Coronary Artery Disease, Hypertension, Type II Disease, Hypertension, Type II Disease, Hypertension, Type II Diabetes, Gout, Osteoarthritis Diabetes, Gout, Osteoarthritis Diabetes, Gout, Osteoarthritis Date Acquired: 10/13/2018 10/13/2018 10/13/2018 Weeks of Treatment: 0 0 0 Wound Status: Open Open Open Measurements L x W x D 1.2x1.7x0.1 1.4x1.6x0.1 0.7x0.7x0.1 (cm) Area (cm) : 1.602 1.759 0.385 Volume (cm) : 0.16 0.176 0.038 Classification: Grade 2 Grade 2 Grade 2 Exudate Amount: Medium Medium Medium Exudate Type: Serosanguineous Serosanguineous Serosanguineous Exudate Color: red, brown red, brown red, brown Granulation Amount: Large (67-100%) Large (67-100%) Large (67-100%) Granulation Quality:  Red Red Red Necrotic Amount: Small (1-33%) Small (1-33%) Small (1-33%) Exposed Structures: Fat Layer (Subcutaneous Fat Layer (Subcutaneous Fat Layer (Subcutaneous Tissue) Exposed:  Yes Tissue) Exposed: Yes Tissue) Exposed: Yes Fascia: No Fascia: No Fascia: No Tendon: No Tendon: No Tendon: No Muscle: No Muscle: No Muscle: No SHOFFNER PEMA, THOMURE ([id number]) Joint: No Joint: No Joint: No Bone: No Bone: No Bone: No Epithelialization: None None None Wound Number: 4 5 N/A Photos: N/A Wound Location: Right Lower Leg - Medial Right Lower Leg - Anterior N/A Wounding Event: Blister Blister N/A Primary Etiology: Diabetic Wound/Ulcer of the Diabetic Wound/Ulcer of the N/A Lower Extremity Lower Extremity Comorbid History: Cataracts, Glaucoma, Cataracts, Glaucoma, N/A Arrhythmia, Congestive Heart Arrhythmia, Congestive Heart Failure, Coronary Artery Failure, Coronary Artery Disease, Hypertension, Type II Disease, Hypertension, Type II Diabetes, Gout, Osteoarthritis Diabetes, Gout, Osteoarthritis Date Acquired: 10/13/2018 10/13/2018 N/A Weeks of Treatment: 0 0 N/A Wound Status: Open Open N/A Measurements L x W x D 2.6x3x0.1 0.9x0.7x0.1 N/A (cm) Area (cm) : 6.126 0.495 N/A Volume (cm) : 0.613 0.049 N/A Classification: Grade 2 Grade 2 N/A Exudate Amount: Medium Medium N/A Exudate Type: Sanguinous Serosanguineous N/A Exudate Color: red red, brown N/A Granulation Amount: Large (67-100%) Large (67-100%) N/A Granulation Quality: Red Red N/A Necrotic Amount: Small (1-33%) Small (1-33%) N/A Exposed Structures: Fat Layer (Subcutaneous Fat Layer (Subcutaneous N/A Tissue) Exposed: Yes Tissue) Exposed: Yes Fascia: No Fascia: No Tendon: No Tendon: No Muscle: No Muscle: No Joint: No Joint: No Bone: No Bone: No Epithelialization: None None N/A Treatment Notes Electronic Signature(s) Signed: 10/19/2018 4:48:17 PM By: Montey Hora Entered By: Montey Hora on 10/19/2018  14:30:25 [name], [name] ([id number]) -------------------------------------------------------------------------------- [place] Details Patient Name: [name], Eliane M. Date of Service: 10/19/2018 2:15 PM Medical Record Number: [id number] Patient Account Number: [id number] Date of Birth/Sex: 01-26-1925 (85 y.o. F) Treating RN: Montey Hora Primary Care Lizandra Zakrzewski: Myrtie Hawk Other Clinician: Referring Roan Miklos: Referral, Self Treating Mishika Flippen/Extender: STONE III, HOYT Weeks in Treatment: 0 Active Inactive Abuse / Safety / Falls / Self Care Management Nursing Diagnoses: Potential for falls Goals: Patient will not experience any injury related to falls Date Initiated: 10/19/2018 Target Resolution Date: 01/05/2019 Goal Status: Active Interventions: Assess fall risk on admission and as needed Notes: Nutrition Nursing Diagnoses: Impaired glucose control: actual or potential Goals: Patient/caregiver agrees to and verbalizes understanding of need to use nutritional supplements and/or vitamins as prescribed Date Initiated: 10/19/2018 Target Resolution Date: 01/05/2019 Goal Status: Active Interventions: Provide education on elevated blood sugars and impact on wound healing Notes: Orientation to the Wound Care Program Nursing Diagnoses: Knowledge deficit related to the wound healing center program Goals: Patient/caregiver will verbalize understanding of the [place] Program Date Initiated: 10/19/2018 Target Resolution Date: 01/05/2019 Goal Status: Active Interventions: Provide education on orientation to the wound center [name], Tacha M. ([id number]) Notes: Venous Leg Ulcer Nursing Diagnoses: Potential for venous Insuffiency (use before diagnosis confirmed) Goals: Patient will maintain optimal edema control Date Initiated: 10/19/2018 Target Resolution Date: 01/05/2019 Goal Status:  Active Interventions: Compression as ordered Notes: Wound/Skin Impairment Nursing Diagnoses: Impaired tissue integrity Goals: Ulcer/skin breakdown will heal within 14 weeks Date Initiated: 10/19/2018 Target Resolution Date: 01/05/2019 Goal Status: Active Interventions: Assess patient/caregiver ability to obtain necessary supplies Assess patient/caregiver ability to perform ulcer/skin care regimen upon admission and as needed Assess ulceration(s) every visit Notes: Electronic Signature(s) Signed: 10/19/2018 4:48:17 PM By: Montey Hora Entered By: Montey Hora on 10/19/2018 14:30:11 [name], [name] ([id number]) -------------------------------------------------------------------------------- Pain Assessment Details Patient Name: [name], Starlina M. Date of Service: 10/19/2018 2:15 PM Medical Record Number: [id number] Patient Account Number: [id number] Date of Birth/Sex:  1925/01/05 (85 y.o. F) Treating RN: Army Melia Primary Care Cordella Nyquist: Myrtie Hawk Other Clinician: Referring Niyati Heinke: Referral, Self Treating Phillis Thackeray/Extender: STONE III, HOYT Weeks in Treatment: 0 Active Problems Location of Pain Severity and Description of Pain Patient Has Paino No Site Locations Pain Management and Medication Current Pain Management: Electronic Signature(s) Signed: 10/19/2018 4:20:53 PM By: Army Melia Entered By: Army Melia on 10/19/2018 14:00:02 [name], [name] ([id number]) -------------------------------------------------------------------------------- Patient/Caregiver Education Details Patient Name: [name], Jayleene M. Date of Service: 10/19/2018 2:15 PM Medical Record Number: [id number] Patient Account Number: [id number] Date of Birth/Gender: [birth year] (85 y.o. F) Treating RN: Montey Hora Primary Care Physician: Myrtie Hawk Other Clinician: Referring Physician: Referral, Self Treating Physician/Extender: Melburn Hake,  HOYT Weeks in Treatment: 0 Education Assessment Education Provided To: Patient and Caregiver Education Topics Provided Venous: Handouts: Other: need for ongoing compression Methods: Demonstration, Explain/Verbal Responses: State content correctly Electronic Signature(s) Signed: 10/19/2018 4:48:17 PM By: Montey Hora Entered By: Montey Hora on 10/19/2018 14:41:00 [name], [name] ([id number]) -------------------------------------------------------------------------------- Wound Assessment Details Patient Name: [name], Mareta M. Date of Service: 10/19/2018 2:15 PM Medical Record Number: [id number] Patient Account Number: [id number] Date of Birth/Sex: Dec 27, 1924 (86 y.o. F) Treating RN: Army Melia Primary Care Rabab Currington: Myrtie Hawk Other Clinician: Referring Laythan Hayter: Referral, Self Treating Hoyte Ziebell/Extender: STONE III, HOYT Weeks in Treatment: 0 Wound Status Wound Number: 1 Primary Diabetic Wound/Ulcer of the Lower Extremity Etiology: Wound Location: Left Lower Leg - Anterior Wound Open Wounding Event: Blister Status: Date Acquired: 10/13/2018 Comorbid Cataracts, Glaucoma, Arrhythmia, Congestive Weeks Of Treatment: 0 History: Heart Failure, Coronary Artery Disease, Clustered Wound: No Hypertension, Type II Diabetes, Gout, Osteoarthritis Photos Wound Measurements Length: (cm) 1.2 % Reduction Width: (cm) 1.7 % Reduction Depth: (cm) 0.1 Epithelializ Area: (cm) 1.602 Tunneling: Volume: (cm) 0.16 Undermining in Area: in Volume: ation: None No : No Wound Description Classification: Grade 2 Foul Odor Af Exudate Amount: Medium Slough/Fibri Exudate Type: Serosanguineous Exudate Color: red, brown ter Cleansing: No no Yes Wound Bed Granulation Amount: Large (67-100%) Exposed Structure Granulation Quality: Red Fascia Exposed: No Necrotic Amount: Small (1-33%) Fat Layer (Subcutaneous Tissue) Exposed: Yes Necrotic Quality: Adherent  Slough Tendon Exposed: No Muscle Exposed: No Joint Exposed: No Bone Exposed: No Treatment Notes [name], Skila M. ([id number]) Wound #1 (Left, Anterior Lower Leg) Notes silvercel, abd, 3 layer wrap bilateral Electronic Signature(s) Signed: 10/19/2018 4:20:53 PM By: Army Melia Entered By: Army Melia on 10/19/2018 14:11:36 [name], [name] ([id number]) -------------------------------------------------------------------------------- Wound Assessment Details Patient Name: [name], Kathie M. Date of Service: 10/19/2018 2:15 PM Medical Record Number: [id number] Patient Account Number: [id number] Date of Birth/Sex: 01-11-1925 (85 y.o. F) Treating RN: Army Melia Primary Care Yichen Gilardi: Myrtie Hawk Other Clinician: Referring Tahlor Berenguer: Referral, Self Treating Corine Solorio/Extender: STONE III, HOYT Weeks in Treatment: 0 Wound Status Wound Number: 2 Primary Diabetic Wound/Ulcer of the Lower Extremity Etiology: Wound Location: Left Lower Leg - Medial, Posterior Wound Open Wounding Event: Blister Status: Date Acquired: 10/13/2018 Comorbid Cataracts, Glaucoma, Arrhythmia, Congestive Weeks Of Treatment: 0 History: Heart Failure, Coronary Artery Disease, Clustered Wound: No Hypertension, Type II Diabetes, Gout, Osteoarthritis Photos Wound Measurements Length: (cm) 1.4 Width: (cm) 1.6 Depth: (cm) 0.1 Area: (cm) 1.759 Volume: (cm) 0.176 % Reduction in Area: % Reduction in Volume: Epithelialization: None Tunneling: No Undermining: No Wound Description Classification: Grade 2 Exudate Amount: Medium Exudate Type: Serosanguineous Exudate Color: red, brown Foul Odor After Cleansing: No Slough/Fibrino Yes Wound Bed Granulation Amount: Large (67-100%) Exposed Structure Granulation Quality: Red Fascia Exposed: No Necrotic  Amount: Small (1-33%) Fat Layer (Subcutaneous Tissue) Exposed: Yes Necrotic Quality: Adherent Slough Tendon Exposed:  No Muscle Exposed: No Joint Exposed: No Bone Exposed: No Treatment Notes [name], Nyjai M. ([id number]) Wound #2 (Left, Medial, Posterior Lower Leg) Notes silvercel, abd, 3 layer wrap bilateral Electronic Signature(s) Signed: 10/19/2018 4:20:53 PM By: Army Melia Entered By: Army Melia on 10/19/2018 14:12:38 [place], [name] ([id number]) -------------------------------------------------------------------------------- Wound Assessment Details Patient Name: [name], Talor M. Date of Service: 10/19/2018 2:15 PM Medical Record Number: [id number] Patient Account Number: [id number] Date of Birth/Sex: 03-20-1924 (85 y.o. F) Treating RN: Army Melia Primary Care Tedra Coppernoll: Myrtie Hawk Other Clinician: Referring Tavonte Seybold: Referral, Self Treating Mar Walmer/Extender: STONE III, HOYT Weeks in Treatment: 0 Wound Status Wound Number: 3 Primary Diabetic Wound/Ulcer of the Lower Extremity Etiology: Wound Location: Left Lower Leg - Medial, Distal Wound Open Wounding Event: Blister Status: Date Acquired: 10/13/2018 Comorbid Cataracts, Glaucoma, Arrhythmia, Congestive Weeks Of Treatment: 0 History: Heart Failure, Coronary Artery Disease, Clustered Wound: No Hypertension, Type II Diabetes, Gout, Osteoarthritis Photos Wound Measurements Length: (cm) 0.7 Width: (cm) 0.7 Depth: (cm) 0.1 Area: (cm) 0.385 Volume: (cm) 0.038 % Reduction in Area: % Reduction in Volume: Epithelialization: None Tunneling: No Undermining: No Wound Description Classification: Grade 2 Foul Odor Exudate Amount: Medium Slough/Fib Exudate Type: Serosanguineous Exudate Color: red, brown After Cleansing: No rino Yes Wound Bed Granulation Amount: Large (67-100%) Exposed Structure Granulation Quality: Red Fascia Exposed: No Necrotic Amount: Small (1-33%) Fat Layer (Subcutaneous Tissue) Exposed: Yes Necrotic Quality: Adherent Slough Tendon Exposed: No Muscle Exposed:  No Joint Exposed: No Bone Exposed: No Treatment Notes [name], Treyana M. ([id number]) Wound #3 (Left, Distal, Medial Lower Leg) Notes silvercel, abd, 3 layer wrap bilateral Electronic Signature(s) Signed: 10/19/2018 4:20:53 PM By: Army Melia Entered By: Army Melia on 10/19/2018 14:13:41 [name], [name] ([id number]) -------------------------------------------------------------------------------- Wound Assessment Details Patient Name: [name], Kimbley M. Date of Service: 10/19/2018 2:15 PM Medical Record Number: [id number] Patient Account Number: [id number] Date of Birth/Sex: Sep 26, 1924 (85 y.o. F) Treating RN: Army Melia Primary Care Janyah Singleterry: Myrtie Hawk Other Clinician: Referring Jaylin Benzel: Referral, Self Treating May Manrique/Extender: STONE III, HOYT Weeks in Treatment: 0 Wound Status Wound Number: 4 Primary Diabetic Wound/Ulcer of the Lower Extremity Etiology: Wound Location: Right Lower Leg - Medial Wound Open Wounding Event: Blister Status: Date Acquired: 10/13/2018 Comorbid Cataracts, Glaucoma, Arrhythmia, Congestive Weeks Of Treatment: 0 History: Heart Failure, Coronary Artery Disease, Clustered Wound: No Hypertension, Type II Diabetes, Gout, Osteoarthritis Photos Wound Measurements Length: (cm) 2.6 % Reduction Width: (cm) 3 % Reduction Depth: (cm) 0.1 Epithelializ Area: (cm) 6.126 Tunneling: Volume: (cm) 0.613 Undermining in Area: in Volume: ation: None No : No Wound Description Classification: Grade 2 Foul Odor Af Exudate Amount: Medium Slough/Fibri Exudate Type: Sanguinous Exudate Color: red ter Cleansing: No no Yes Wound Bed Granulation Amount: Large (67-100%) Exposed Structure Granulation Quality: Red Fascia Exposed: No Necrotic Amount: Small (1-33%) Fat Layer (Subcutaneous Tissue) Exposed: Yes Necrotic Quality: Adherent Slough Tendon Exposed: No Muscle Exposed: No Joint Exposed: No Bone Exposed:  No Treatment Notes [name], Sherriann M. ([id number]) Wound #4 (Right, Medial Lower Leg) Notes silvercel, abd, 3 layer wrap bilateral Electronic Signature(s) Signed: 10/19/2018 4:20:53 PM By: Army Melia Entered By: Army Melia on 10/19/2018 14:15:04 [place], [name] ([id number]) -------------------------------------------------------------------------------- Wound Assessment Details Patient Name: [name], Juanice M. Date of Service: 10/19/2018 2:15 PM Medical Record Number: [id number] Patient Account Number: [id number] Date of Birth/Sex: 1924-11-09 (85 y.o. F) Treating RN: Army Melia Primary Care Alazae Crymes:  MCLEAN-SCOCOZZA, TRACY Other Clinician: Referring Oriyah Lamphear: Referral, Self Treating Kathryne Ramella/Extender: STONE III, HOYT Weeks in Treatment: 0 Wound Status Wound Number: 5 Primary Diabetic Wound/Ulcer of the Lower Extremity Etiology: Wound Location: Right Lower Leg - Anterior Wound Open Wounding Event: Blister Status: Date Acquired: 10/13/2018 Comorbid Cataracts, Glaucoma, Arrhythmia, Congestive Weeks Of Treatment: 0 History: Heart Failure, Coronary Artery Disease, Clustered Wound: No Hypertension, Type II Diabetes, Gout, Osteoarthritis Photos Wound Measurements Length: (cm) 0.9 % Reduction Width: (cm) 0.7 % Reduction Depth: (cm) 0.1 Epithelializ Area: (cm) 0.495 Tunneling: Volume: (cm) 0.049 Undermining in Area: in Volume: ation: None No : No Wound Description Classification: Grade 2 Foul Odor Af Exudate Amount: Medium Slough/Fibri Exudate Type: Serosanguineous Exudate Color: red, brown ter Cleansing: No no Yes Wound Bed Granulation Amount: Large (67-100%) Exposed Structure Granulation Quality: Red Fascia Exposed: No Necrotic Amount: Small (1-33%) Fat Layer (Subcutaneous Tissue) Exposed: Yes Necrotic Quality: Adherent Slough Tendon Exposed: No Muscle Exposed: No Joint Exposed: No Bone Exposed: No Treatment Notes SHOFFNER  SIMMONS, Violet M. ([id number]) Wound #5 (Right, Anterior Lower Leg) Notes silvercel, abd, 3 layer wrap bilateral Electronic Signature(s) Signed: 10/19/2018 4:20:53 PM By: Army Melia Entered By: Army Melia on 10/19/2018 14:16:04 [name], [name] ([id number]) -------------------------------------------------------------------------------- Vitals Details Patient Name: [name], Bellagrace M. Date of Service: 10/19/2018 2:15 PM Medical Record Number: [id number] Patient Account Number: [id number] Date of Birth/Sex: 1924-07-20 (85 y.o. F) Treating RN: Army Melia Primary Care Tequlia Gonsalves: Myrtie Hawk Other Clinician: Referring Ann Groeneveld: Referral, Self Treating Afua Hoots/Extender: STONE III, HOYT Weeks in Treatment: 0 Vital Signs Time Taken: 14:00 Temperature (F): 98.1 Height (in): 60 Pulse (bpm): 72 Source: Stated Respiratory Rate (breaths/min): 16 Weight (lbs): 190 Blood Pressure (mmHg): 165/76 Source: Stated Reference Range: 80 - 120 mg / dl Body Mass Index (BMI): 37.1 Electronic Signature(s) Signed: 10/19/2018 4:20:53 PM By: Army Melia Entered By: Army Melia on 10/19/2018 14:00:29

## 2018-10-22 ENCOUNTER — Other Ambulatory Visit: Payer: Self-pay

## 2018-10-22 DIAGNOSIS — E11622 Type 2 diabetes mellitus with other skin ulcer: Secondary | ICD-10-CM | POA: Diagnosis not present

## 2018-10-22 NOTE — Progress Notes (Signed)
[place] ([id number]) Visit Report for 10/22/2018 Arrival Information Details Patient Name: [name], [name]. Date of Service: 10/22/2018 1:00 PM Medical Record Number: [id number] Patient Account Number: [id number] Date of Birth/Sex: Jul 08, 1924 (86 y.o. F) Treating RN: Montey Hora Primary Care Cayleigh Paull: Myrtie Hawk Other Clinician: Referring Pearson Reasons: Myrtie Hawk Treating Camika Marsico/Extender: Melburn Hake, HOYT Weeks in Treatment: 0 Visit Information History Since Last Visit Added or deleted any medications: No Patient Arrived: Wheel Chair Any new allergies or adverse reactions: No Arrival Time: 13:03 Had a fall or experienced change in No Accompanied By: daughter activities of daily living that may affect Transfer Assistance: Manual risk of falls: Patient Identification Verified: Yes Signs or symptoms of abuse/neglect since last visito No Secondary Verification Process Yes Hospitalized since last visit: No Completed: Implantable device outside of the clinic excluding No Patient Has Alerts: Yes cellular tissue based products placed in the center Patient Alerts: ABI 04/30/18 L [place] R since last visit: [place] Has Dressing in Place as Prescribed: Yes TBI L .70 R .98 Has Compression in Place as Prescribed: Yes DMII Pain Present Now: No Electronic Signature(s) Signed: 10/22/2018 3:54:17 PM By: Montey Hora Entered By: Montey Hora on 10/22/2018 13:04:04 [name], [name] ([id number]) -------------------------------------------------------------------------------- Compression Therapy Details Patient Name: [name], Willowdean M. Date of Service: 10/22/2018 1:00 PM Medical Record Number: [id number] Patient Account Number: [id number] Date of Birth/Sex: [birth year] (85 y.o. F) Treating RN: Montey Hora Primary Care Yandiel Bergum: Myrtie Hawk Other Clinician: Referring Hyden Soley: Myrtie Hawk Treating  Calleen Alvis/Extender: Melburn Hake, HOYT Weeks in Treatment: 0 Compression Therapy Performed for Wound Assessment: Wound #1 Left,Anterior Lower Leg Performed By: Clinician Montey Hora, RN Compression Type: Three Layer Pre Treatment ABI: 0.7 Notes ABI [place] Bilateral but TBI .7 and .98 Electronic Signature(s) Signed: 10/22/2018 3:54:17 PM By: Montey Hora Entered By: Montey Hora on 10/22/2018 13:12:39 [name], [name] ([id number]) -------------------------------------------------------------------------------- Compression Therapy Details Patient Name: [name], Cassandra M. Date of Service: 10/22/2018 1:00 PM Medical Record Number: [id number] Patient Account Number: [id number] Date of Birth/Sex: Feb 20, 1925 (85 y.o. F) Treating RN: Montey Hora Primary Care Naoki Migliaccio: Myrtie Hawk Other Clinician: Referring Honest Safranek: Myrtie Hawk Treating Inaki Vantine/Extender: Melburn Hake, HOYT Weeks in Treatment: 0 Compression Therapy Performed for Wound Assessment: Wound #2 Left,Medial,Posterior Lower Leg Performed By: Clinician Montey Hora, RN Compression Type: Three Layer Pre Treatment ABI: 0.7 Notes ABI [place] Bilateral but TBI .7 and .98 Electronic Signature(s) Signed: 10/22/2018 3:54:17 PM By: Montey Hora Entered By: Montey Hora on 10/22/2018 13:12:39 [name], [name] ([id number]) -------------------------------------------------------------------------------- Compression Therapy Details Patient Name: [name], Cassandra M. Date of Service: 10/22/2018 1:00 PM Medical Record Number: [id number] Patient Account Number: [id number] Date of Birth/Sex: 1924-09-30 (85 y.o. F) Treating RN: Montey Hora Primary Care Noheli Melder: Myrtie Hawk Other Clinician: Referring Santi Troung: Myrtie Hawk Treating Jermarion Poffenberger/Extender: Melburn Hake, HOYT Weeks in Treatment: 0 Compression Therapy Performed for Wound Assessment: Wound #3 Left,Distal,Medial Lower  Leg Performed By: Clinician Montey Hora, RN Compression Type: Three Layer Pre Treatment ABI: 0.7 Notes ABI [place] Bilateral but TBI .7 and .98 Electronic Signature(s) Signed: 10/22/2018 3:54:17 PM By: Montey Hora Entered By: Montey Hora on 10/22/2018 13:12:39 [name], [name] ([id number]) -------------------------------------------------------------------------------- Compression Therapy Details Patient Name: [name], Cassandra M. Date of Service: 10/22/2018 1:00 PM Medical Record Number: [id number] Patient Account Number: [id number] Date of Birth/Sex: 1924-07-23 (85 y.o. F) Treating RN: Montey Hora Primary Care Irene Collings: Myrtie Hawk Other Clinician: Referring Sarie Stall: Myrtie Hawk Treating Lyman Balingit/Extender: STONE III, HOYT Weeks in Treatment: 0 Compression  Therapy Performed for Wound Assessment: Wound #4 Right,Medial Lower Leg Performed By: Clinician Montey Hora, RN Compression Type: Three Layer Pre Treatment ABI: 0.7 Notes ABI [place] Bilateral but TBI .7 and .98 Electronic Signature(s) Signed: 10/22/2018 3:54:17 PM By: Montey Hora Entered By: Montey Hora on 10/22/2018 13:12:39 [name], [name] ([id number]) -------------------------------------------------------------------------------- Compression Therapy Details Patient Name: [name], Cassandra M. Date of Service: 10/22/2018 1:00 PM Medical Record Number: [id number] Patient Account Number: [id number] Date of Birth/Sex: May 31, 1924 (85 y.o. F) Treating RN: Montey Hora Primary Care Matisyn Cabeza: Myrtie Hawk Other Clinician: Referring Cariah Salatino: Myrtie Hawk Treating Tayen Narang/Extender: Melburn Hake, HOYT Weeks in Treatment: 0 Compression Therapy Performed for Wound Assessment: Wound #5 Right,Anterior Lower Leg Performed By: Clinician Montey Hora, RN Compression Type: Three Layer Pre Treatment ABI: 0.7 Notes ABI [place] Bilateral but TBI .7 and  .98 Electronic Signature(s) Signed: 10/22/2018 3:54:17 PM By: Montey Hora Entered By: Montey Hora on 10/22/2018 13:12:39 [name], [name] ([id number]) -------------------------------------------------------------------------------- Encounter Discharge Information Details Patient Name: [name], Mishayla M. Date of Service: 10/22/2018 1:00 PM Medical Record Number: [id number] Patient Account Number: [id number] Date of Birth/Sex: 1924-08-22 (85 y.o. F) Treating RN: Montey Hora Primary Care Teodor Prater: Myrtie Hawk Other Clinician: Referring Cassanda Walmer: Myrtie Hawk Treating Jaydan Meidinger/Extender: Melburn Hake, HOYT Weeks in Treatment: 0 Encounter Discharge Information Items Discharge Condition: Stable Ambulatory Status: Wheelchair Discharge Destination: Home Transportation: Private Auto Accompanied By: daughter Schedule Follow-up Appointment: No Clinical Summary of Care: Electronic Signature(s) Signed: 10/22/2018 3:54:17 PM By: Montey Hora Entered By: Montey Hora on 10/22/2018 13:36:07 [name], [name] ([id number]) -------------------------------------------------------------------------------- Wound Assessment Details Patient Name: [name], Dejanique M. Date of Service: 10/22/2018 1:00 PM Medical Record Number: [id number] Patient Account Number: [id number] Date of Birth/Sex: 06/13/1924 (85 y.o. F) Treating RN: Montey Hora Primary Care Jevonte Clanton: Myrtie Hawk Other Clinician: Referring Tyrin Herbers: Myrtie Hawk Treating Audi Wettstein/Extender: STONE III, HOYT Weeks in Treatment: 0 Wound Status Wound Number: 1 Primary Diabetic Wound/Ulcer of the Lower Extremity Etiology: Wound Location: Left Lower Leg - Anterior Wound Open Wounding Event: Blister Status: Date Acquired: 10/13/2018 Comorbid Cataracts, Glaucoma, Arrhythmia, Congestive Weeks Of Treatment: 0 History: Heart Failure, Coronary Artery Disease, Clustered  Wound: No Hypertension, Type II Diabetes, Gout, Osteoarthritis Wound Measurements Length: (cm) 1.2 Width: (cm) 1.7 Depth: (cm) 0.1 Area: (cm) 1.602 Volume: (cm) 0.16 % Reduction in Area: 0% % Reduction in Volume: 0% Epithelialization: None Tunneling: No Undermining: No Wound Description Classification: Grade 2 Exudate Amount: Medium Exudate Type: Serosanguineous Exudate Color: red, brown Foul Odor After Cleansing: No Slough/Fibrino Yes Wound Bed Granulation Amount: Large (67-100%) Exposed Structure Granulation Quality: Red Fascia Exposed: No Necrotic Amount: Small (1-33%) Fat Layer (Subcutaneous Tissue) Exposed: Yes Necrotic Quality: Adherent Slough Tendon Exposed: No Muscle Exposed: No Joint Exposed: No Bone Exposed: No Treatment Notes Wound #1 (Left, Anterior Lower Leg) Notes silvercel, abd, 3 layer wrap bilateral Electronic Signature(s) Signed: 10/22/2018 3:54:17 PM By: Montey Hora Entered By: Montey Hora on 10/22/2018 13:11:12 [name], [name] ([id number]) -------------------------------------------------------------------------------- Wound Assessment Details Patient Name: [name], Delfina M. Date of Service: 10/22/2018 1:00 PM Medical Record Number: [id number] Patient Account Number: [id number] Date of Birth/Sex: 12-Apr-1924 (85 y.o. F) Treating RN: Montey Hora Primary Care Romelia Bromell: Myrtie Hawk Other Clinician: Referring Jaydence Arnesen: Myrtie Hawk Treating Damiah Mcdonald/Extender: STONE III, HOYT Weeks in Treatment: 0 Wound Status Wound Number: 2 Primary Diabetic Wound/Ulcer of the Lower Extremity Etiology: Wound Location: Left Lower Leg - Medial, Posterior Wound Open Wounding Event: Blister Status: Date Acquired: 10/13/2018 Comorbid Cataracts, Glaucoma, Arrhythmia, Congestive Weeks  Of Treatment: 0 History: Heart Failure, Coronary Artery Disease, Clustered Wound: No Hypertension, Type II Diabetes,  Gout, Osteoarthritis Wound Measurements Length: (cm) 1.4 Width: (cm) 1.6 Depth: (cm) 0.1 Area: (cm) 1.759 Volume: (cm) 0.176 % Reduction in Area: 0% % Reduction in Volume: 0% Epithelialization: None Tunneling: No Undermining: No Wound Description Classification: Grade 2 Exudate Amount: Medium Exudate Type: Serosanguineous Exudate Color: red, brown Foul Odor After Cleansing: No Slough/Fibrino Yes Wound Bed Granulation Amount: Large (67-100%) Exposed Structure Granulation Quality: Red Fascia Exposed: No Necrotic Amount: Small (1-33%) Fat Layer (Subcutaneous Tissue) Exposed: Yes Necrotic Quality: Adherent Slough Tendon Exposed: No Muscle Exposed: No Joint Exposed: No Bone Exposed: No Treatment Notes Wound #2 (Left, Medial, Posterior Lower Leg) Notes silvercel, abd, 3 layer wrap bilateral Electronic Signature(s) Signed: 10/22/2018 3:54:17 PM By: Montey Hora Entered By: Montey Hora on 10/22/2018 13:11:23 [name], [name] ([id number]) -------------------------------------------------------------------------------- Wound Assessment Details Patient Name: [name], Ikesha M. Date of Service: 10/22/2018 1:00 PM Medical Record Number: [id number] Patient Account Number: [id number] Date of Birth/Sex: [birth year] (85 y.o. F) Treating RN: Montey Hora Primary Care Gustaf Mccarter: Myrtie Hawk Other Clinician: Referring Halah Whiteside: Myrtie Hawk Treating Alizea Pell/Extender: STONE III, HOYT Weeks in Treatment: 0 Wound Status Wound Number: 3 Primary Diabetic Wound/Ulcer of the Lower Extremity Etiology: Wound Location: Left Lower Leg - Medial, Distal Wound Open Wounding Event: Blister Status: Date Acquired: 10/13/2018 Comorbid Cataracts, Glaucoma, Arrhythmia, Congestive Weeks Of Treatment: 0 History: Heart Failure, Coronary Artery Disease, Clustered Wound: No Hypertension, Type II Diabetes, Gout, Osteoarthritis Wound Measurements Length: (cm)  0.7 Width: (cm) 0.7 Depth: (cm) 0.1 Area: (cm) 0.385 Volume: (cm) 0.038 % Reduction in Area: 0% % Reduction in Volume: 0% Epithelialization: None Tunneling: No Undermining: No Wound Description Classification: Grade 2 Exudate Amount: Medium Exudate Type: Serosanguineous Exudate Color: red, brown Foul Odor After Cleansing: No Slough/Fibrino Yes Wound Bed Granulation Amount: Large (67-100%) Exposed Structure Granulation Quality: Red Fascia Exposed: No Necrotic Amount: Small (1-33%) Fat Layer (Subcutaneous Tissue) Exposed: Yes Necrotic Quality: Adherent Slough Tendon Exposed: No Muscle Exposed: No Joint Exposed: No Bone Exposed: No Treatment Notes Wound #3 (Left, Distal, Medial Lower Leg) Notes silvercel, abd, 3 layer wrap bilateral Electronic Signature(s) Signed: 10/22/2018 3:54:17 PM By: Montey Hora Entered By: Montey Hora on 10/22/2018 13:11:33 [name], [name] ([id number]) -------------------------------------------------------------------------------- Wound Assessment Details Patient Name: [name], Tenille M. Date of Service: 10/22/2018 1:00 PM Medical Record Number: [id number] Patient Account Number: [id number] Date of Birth/Sex: 01-05-1925 (85 y.o. F) Treating RN: Montey Hora Primary Care Phoenix Dresser: Myrtie Hawk Other Clinician: Referring Burman Bruington: Myrtie Hawk Treating Charisma Charlot/Extender: STONE III, HOYT Weeks in Treatment: 0 Wound Status Wound Number: 4 Primary Diabetic Wound/Ulcer of the Lower Extremity Etiology: Wound Location: Right Lower Leg - Medial Wound Open Wounding Event: Blister Status: Date Acquired: 10/13/2018 Comorbid Cataracts, Glaucoma, Arrhythmia, Congestive Weeks Of Treatment: 0 History: Heart Failure, Coronary Artery Disease, Clustered Wound: No Hypertension, Type II Diabetes, Gout, Osteoarthritis Wound Measurements Length: (cm) 2.6 Width: (cm) 3 Depth: (cm) 0.1 Area: (cm) 6.126 Volume:  (cm) 0.613 % Reduction in Area: 0% % Reduction in Volume: 0% Epithelialization: None Tunneling: No Undermining: No Wound Description Classification: Grade 2 Exudate Amount: Medium Exudate Type: Sanguinous Exudate Color: red Foul Odor After Cleansing: No Slough/Fibrino Yes Wound Bed Granulation Amount: Large (67-100%) Exposed Structure Granulation Quality: Red Fascia Exposed: No Necrotic Amount: Small (1-33%) Fat Layer (Subcutaneous Tissue) Exposed: Yes Necrotic Quality: Adherent Slough Tendon Exposed: No Muscle Exposed: No Joint Exposed: No Bone Exposed: No Treatment Notes Wound #4 (  Right, Medial Lower Leg) Notes silvercel, abd, 3 layer wrap bilateral Electronic Signature(s) Signed: 10/22/2018 3:54:17 PM By: Montey Hora Entered By: Montey Hora on 10/22/2018 13:11:44 [name], [name] ([id number]) -------------------------------------------------------------------------------- Wound Assessment Details Patient Name: [name], Freddye M. Date of Service: 10/22/2018 1:00 PM Medical Record Number: [id number] Patient Account Number: [id number] Date of Birth/Sex: Apr 07, 1924 (85 y.o. F) Treating RN: Montey Hora Primary Care Kay Shippy: Myrtie Hawk Other Clinician: Referring Malissa Slay: Myrtie Hawk Treating Ayuub Penley/Extender: STONE III, HOYT Weeks in Treatment: 0 Wound Status Wound Number: 5 Primary Diabetic Wound/Ulcer of the Lower Extremity Etiology: Wound Location: Right Lower Leg - Anterior Wound Open Wounding Event: Blister Status: Date Acquired: 10/13/2018 Comorbid Cataracts, Glaucoma, Arrhythmia, Congestive Weeks Of Treatment: 0 History: Heart Failure, Coronary Artery Disease, Clustered Wound: No Hypertension, Type II Diabetes, Gout, Osteoarthritis Wound Measurements Length: (cm) 0.9 Width: (cm) 0.7 Depth: (cm) 0.1 Area: (cm) 0.495 Volume: (cm) 0.049 % Reduction in Area: 0% % Reduction in Volume:  0% Epithelialization: None Tunneling: No Undermining: No Wound Description Classification: Grade 2 Exudate Amount: Medium Exudate Type: Serosanguineous Exudate Color: red, brown Foul Odor After Cleansing: No Slough/Fibrino Yes Wound Bed Granulation Amount: Large (67-100%) Exposed Structure Granulation Quality: Red Fascia Exposed: No Necrotic Amount: Small (1-33%) Fat Layer (Subcutaneous Tissue) Exposed: Yes Necrotic Quality: Adherent Slough Tendon Exposed: No Muscle Exposed: No Joint Exposed: No Bone Exposed: No Treatment Notes Wound #5 (Right, Anterior Lower Leg) Notes silvercel, abd, 3 layer wrap bilateral Electronic Signature(s) Signed: 10/22/2018 3:54:17 PM By: Montey Hora Entered By: Montey Hora on 10/22/2018 13:11:56

## 2018-10-23 ENCOUNTER — Other Ambulatory Visit: Payer: Self-pay

## 2018-10-23 ENCOUNTER — Other Ambulatory Visit (INDEPENDENT_AMBULATORY_CARE_PROVIDER_SITE_OTHER): Payer: PPO

## 2018-10-23 DIAGNOSIS — D649 Anemia, unspecified: Secondary | ICD-10-CM | POA: Diagnosis not present

## 2018-10-23 DIAGNOSIS — M109 Gout, unspecified: Secondary | ICD-10-CM

## 2018-10-23 DIAGNOSIS — E1165 Type 2 diabetes mellitus with hyperglycemia: Secondary | ICD-10-CM

## 2018-10-23 LAB — COMPREHENSIVE METABOLIC PANEL
ALT: 12 U/L (ref 0–35)
AST: 20 U/L (ref 0–37)
Albumin: 3.8 g/dL (ref 3.5–5.2)
Alkaline Phosphatase: 56 U/L (ref 39–117)
BUN: 17 mg/dL (ref 6–23)
CO2: 32 mEq/L (ref 19–32)
Calcium: 9.3 mg/dL (ref 8.4–10.5)
Chloride: 100 mEq/L (ref 96–112)
Creatinine, Ser: 1.2 mg/dL (ref 0.40–1.20)
GFR: 50.84 mL/min — ABNORMAL LOW (ref >60.00)
Glucose, Bld: 188 mg/dL — ABNORMAL HIGH (ref 70–99)
Potassium: 3.9 mEq/L (ref 3.5–5.1)
Sodium: 139 mEq/L (ref 135–145)
Total Bilirubin: 0.6 mg/dL (ref 0.2–1.2)
Total Protein: 6.7 g/dL (ref 6.0–8.3)

## 2018-10-23 LAB — URIC ACID: Uric Acid, Serum: 6.2 mg/dL (ref 2.4–7.0)

## 2018-10-23 LAB — CBC WITH DIFFERENTIAL/PLATELET
Basophils Absolute: 0.1 10*3/uL (ref 0.0–0.1)
Basophils Relative: 0.7 % (ref 0.0–3.0)
Eosinophils Absolute: 0.5 10*3/uL (ref 0.0–0.7)
Eosinophils Relative: 5.2 % — ABNORMAL HIGH (ref 0.0–5.0)
HCT: 33.2 % — ABNORMAL LOW (ref 36.0–46.0)
Hemoglobin: 11.1 g/dL — ABNORMAL LOW (ref 12.0–15.0)
Lymphocytes Relative: 11.8 % — ABNORMAL LOW (ref 12.0–46.0)
Lymphs Abs: 1.1 10*3/uL (ref 0.7–4.0)
MCHC: 33.5 g/dL (ref 30.0–36.0)
MCV: 93.5 fl (ref 78.0–100.0)
Monocytes Absolute: 0.6 10*3/uL (ref 0.1–1.0)
Monocytes Relative: 6.7 % (ref 3.0–12.0)
Neutro Abs: 6.9 10*3/uL (ref 1.4–7.7)
Neutrophils Relative %: 75.6 % (ref 43.0–77.0)
Platelets: 163 10*3/uL (ref 150.0–400.0)
RBC: 3.55 Mil/uL — ABNORMAL LOW (ref 3.87–5.11)
RDW: 15.7 % — ABNORMAL HIGH (ref 11.5–15.5)
WBC: 9.1 10*3/uL (ref 4.0–10.5)

## 2018-10-23 LAB — HEMOGLOBIN A1C: Hgb A1c MFr Bld: 10.3 % — ABNORMAL HIGH (ref 4.6–6.5)

## 2018-10-24 ENCOUNTER — Telehealth: Payer: Self-pay

## 2018-10-24 ENCOUNTER — Other Ambulatory Visit: Payer: Self-pay | Admitting: Internal Medicine

## 2018-10-24 DIAGNOSIS — E1165 Type 2 diabetes mellitus with hyperglycemia: Secondary | ICD-10-CM

## 2018-10-24 MED ORDER — GLIPIZIDE 5 MG PO TABS: 2.5000 mg | ORAL_TABLET | Freq: Two times a day (BID) | ORAL | 7 refills | Status: DC

## 2018-10-24 NOTE — Telephone Encounter (Signed)
I do not see anything in the chart about adding a new medication?

## 2018-10-24 NOTE — Telephone Encounter (Signed)
Pt's daughter called back in, she said that in pt's recent lab there were a recommendation from provider to take insulin or take a pill, pt would like to take the pill instead.

## 2018-10-24 NOTE — Telephone Encounter (Signed)
Copied from [place] [id number]. Topic: General - Inquiry >> Oct 24, 2018  9:14 AM Richardo Priest, NT wrote: Reason for CRM: Patient's daughter called in stating she would like the medication that is going to be added to mother's diabetic medication, to be called in today at the [facility] in [place].

## 2018-10-24 NOTE — Telephone Encounter (Signed)
Sent glipizide 2.5 mg 2x per day with food always as this can lower blood sugar if she does not eat [place]

## 2018-10-25 ENCOUNTER — Other Ambulatory Visit: Payer: Self-pay | Admitting: Cardiovascular Disease

## 2018-10-25 ENCOUNTER — Other Ambulatory Visit: Payer: Self-pay | Admitting: Family

## 2018-10-25 NOTE — Telephone Encounter (Signed)
Patients daughter is aware

## 2018-10-25 NOTE — Telephone Encounter (Signed)
I spoke with pt's daughter and she confirmed pt is taking the Potassium 10 meq qd. Please advise correct dose pt has both Potassium 20 meq and 10 meq on current medication list.

## 2018-10-25 NOTE — Telephone Encounter (Signed)
Potassium 10 meq qd.

## 2018-10-26 ENCOUNTER — Telehealth: Payer: Self-pay | Admitting: Internal Medicine

## 2018-10-26 ENCOUNTER — Encounter: Payer: PPO | Admitting: Physician Assistant

## 2018-10-26 ENCOUNTER — Other Ambulatory Visit: Payer: Self-pay

## 2018-10-26 ENCOUNTER — Encounter: Payer: Self-pay | Admitting: *Deleted

## 2018-10-26 ENCOUNTER — Other Ambulatory Visit: Payer: Self-pay | Admitting: Internal Medicine

## 2018-10-26 DIAGNOSIS — B3731 Acute candidiasis of vulva and vagina: Secondary | ICD-10-CM

## 2018-10-26 DIAGNOSIS — L97812 Non-pressure chronic ulcer of other part of right lower leg with fat layer exposed: Secondary | ICD-10-CM | POA: Diagnosis not present

## 2018-10-26 DIAGNOSIS — E11622 Type 2 diabetes mellitus with other skin ulcer: Secondary | ICD-10-CM | POA: Diagnosis not present

## 2018-10-26 DIAGNOSIS — E11621 Type 2 diabetes mellitus with foot ulcer: Secondary | ICD-10-CM | POA: Diagnosis not present

## 2018-10-26 DIAGNOSIS — L97822 Non-pressure chronic ulcer of other part of left lower leg with fat layer exposed: Secondary | ICD-10-CM | POA: Diagnosis not present

## 2018-10-26 DIAGNOSIS — B373 Candidiasis of vulva and vagina: Secondary | ICD-10-CM

## 2018-10-26 DIAGNOSIS — L97222 Non-pressure chronic ulcer of left calf with fat layer exposed: Secondary | ICD-10-CM | POA: Diagnosis not present

## 2018-10-26 MED ORDER — FLUCONAZOLE 150 MG PO TABS: 150.0000 mg | ORAL_TABLET | Freq: Once | ORAL | 0 refills | Status: AC

## 2018-10-26 NOTE — Telephone Encounter (Signed)
Sent Diflucan   

## 2018-10-26 NOTE — Telephone Encounter (Signed)
Harris,[name] (daughter) requesting diflucan due to yeast infection   [id number] [address], [place] [phone] (Phone) [phone] (Fax)

## 2018-10-26 NOTE — Telephone Encounter (Signed)
This patient is requesting a refill on diflucan, she has severe itching, and burning.  Nina,cma

## 2018-10-26 NOTE — Progress Notes (Addendum)
[place] ([id number]) Visit Report for 10/26/2018 Chief Complaint Document Details Patient Name: [name], [name]. Date of Service: 10/26/2018 10:30 AM Medical Record Number: [id number] Patient Account Number: [id number] Date of Birth/Sex: 1924/06/26 (85 y.o. F) Treating RN: Montey Hora Primary Care Provider: Myrtie Hawk Other Clinician: Referring Provider: Myrtie Hawk Treating Provider/Extender: Melburn Hake, HOYT Weeks in Treatment: 1 Information Obtained from: Patient Chief Complaint Bilateral LE lymphedema Electronic Signature(s) Signed: 10/26/2018 10:28:31 AM By: Worthy Keeler PA-C Entered By: Worthy Keeler on 10/26/2018 10:28:31 [name], [name] ([id number]) -------------------------------------------------------------------------------- Debridement Details Patient Name: [name], Jenai M. Date of Service: 10/26/2018 10:30 AM Medical Record Number: [id number] Patient Account Number: [id number] Date of Birth/Sex: 1924-07-06 (85 y.o. F) Treating RN: Montey Hora Primary Care Provider: Myrtie Hawk Other Clinician: Referring Provider: Myrtie Hawk Treating Provider/Extender: Melburn Hake, HOYT Weeks in Treatment: 1 Debridement Performed for Wound #2 Left,Medial,Posterior Lower Leg Assessment: Performed By: Physician STONE III, HOYT E., PA-C Debridement Type: Chemical/Enzymatic/Mechanical Agent Used: saline and gauze Severity of Tissue Pre Fat layer exposed Debridement: Level of Consciousness (Pre- Awake and Alert procedure): Pre-procedure Verification/Time Yes - 11:05 Out Taken: Start Time: 11:05 Pain Control: Lidocaine 4% Topical Solution Instrument: Other : gauze Bleeding: None End Time: 11:06 Procedural Pain: 0 Post Procedural Pain: 0 Response to Treatment: Procedure was tolerated well Level of Consciousness Awake and Alert (Post-procedure): Post Debridement Measurements of  Total Wound Length: (cm) 0.1 Width: (cm) 0.1 Depth: (cm) 0.1 Volume: (cm) 0.001 Character of Wound/Ulcer Post Debridement: Improved Severity of Tissue Post Debridement: Fat layer exposed Post Procedure Diagnosis Same as Pre-procedure Electronic Signature(s) Signed: 10/26/2018 1:01:55 PM By: Montey Hora Signed: 10/28/2018 6:59:38 PM By: Worthy Keeler PA-C Entered By: Montey Hora on 10/26/2018 11:29:33 [name], [name] ([id number]) -------------------------------------------------------------------------------- Debridement Details Patient Name: [name], Cassandra M. Date of Service: 10/26/2018 10:30 AM Medical Record Number: [id number] Patient Account Number: [id number] Date of Birth/Sex: 11-19-1924 (85 y.o. F) Treating RN: Montey Hora Primary Care Provider: Myrtie Hawk Other Clinician: Referring Provider: Myrtie Hawk Treating Provider/Extender: Melburn Hake, HOYT Weeks in Treatment: 1 Debridement Performed for Wound #4 Right,Medial Lower Leg Assessment: Performed By: Physician STONE III, HOYT E., PA-C Debridement Type: Chemical/Enzymatic/Mechanical Agent Used: saline and gauze Severity of Tissue Pre Fat layer exposed Debridement: Level of Consciousness (Pre- Awake and Alert procedure): Pre-procedure Verification/Time Yes - 11:06 Out Taken: Start Time: 11:06 Pain Control: Lidocaine 4% Topical Solution Instrument: Other : gauze Bleeding: None End Time: 11:07 Procedural Pain: 0 Post Procedural Pain: 0 Response to Treatment: Procedure was tolerated well Level of Consciousness Awake and Alert (Post-procedure): Post Debridement Measurements of Total Wound Length: (cm) 3.5 Width: (cm) 2.5 Depth: (cm) 0.1 Volume: (cm) 0.687 Character of Wound/Ulcer Post Debridement: Improved Severity of Tissue Post Debridement: Fat layer exposed Post Procedure Diagnosis Same as Pre-procedure Electronic Signature(s) Signed: 10/26/2018 1:01:55  PM By: Montey Hora Signed: 10/28/2018 6:59:38 PM By: Worthy Keeler PA-C Entered By: Montey Hora on 10/26/2018 11:30:05 [name], [name] ([id number]) -------------------------------------------------------------------------------- HPI Details Patient Name: [name], Cassandra M. Date of Service: 10/26/2018 10:30 AM Medical Record Number: [id number] Patient Account Number: [id number] Date of Birth/Sex: [birth year] (85 y.o. F) Treating RN: Montey Hora Primary Care Provider: Myrtie Hawk Other Clinician: Referring Provider: Myrtie Hawk Treating Provider/Extender: Melburn Hake, HOYT Weeks in Treatment: 1 History of Present Illness HPI Description: 04/17/18 on evaluation today patient presents for initial inspection in our clinic concerning issues that she has been having with her gluteal region.  Things actually appear to be doing much better at this point although we could go her daughter tells me that the patient was actually having areas that were draining in the gluteal region bilaterally. Fortunately these regions appear to have resolved. Still there is evidence of pressure injury to the gluteal region she has blanchable erythema but decreased capillary refill to the area which again is an early sign of pressure. Again I do think that her daily activity may be the cause of such. She does have a history of diabetes with a hemoglobin A1c of 7.8 on 04/11/18. She also has gout and hypertension. The patient does get around in her home according to what her daughter tells me. However she spends most of her day and actually her night as well in her recliner which I think is likely where the pressure is coming from. She tells me that she doesn't sleep in a bed and pretty much unless she's getting up to use the bathroom or eat she is in her recliner. This again I think is likely the biggest issue that we're seeing right now for what's causing the pressure to her  gluteal region. 08/02/18 on evaluation today patient actually presents for reevaluation in clinic concerning issues that she's been having with her gluteal region which the good news is appears to be completely healed at this time. There's no open wound currently. She did have bilateral lower extremity lymphedema which was starting to week more on the right lower extremity especially. With that being said this seems to be also doing better in resolving she's not wearing the compression stockings of I think she really is that compression stockings pretty much all the time. She does have TBI's that were performed and finalized on 04/30/18 they appear to be doing well with a right TBI of 0.70 in the left TBI of 0.98. Overall I feel like the patient is actually doing quite well although we do need to have some things that we go through with them as far as teaching is concerned in order to prevent issues from showing up in the future. No fevers, chills, nausea, or vomiting noted at this time. 10/19/2018 upon evaluation today patient presents for reevaluation our clinic concerning issues that she is actually having with her bilateral lower extremities. I have seen her a couple times previous where we have discussed a wound on her gluteal region which actually was not giving her any trouble technically the last time I saw her. She is always had issues with her legs and swelling she does have lymphedema pumps. In fact by some crazy coincidence she actually ended up having them ordered from 2 different locations all likely from the congestive heart failure clinic as well as vascular and ended up with 2 sets of lymphedema pumps. Nonetheless 1 of them she got covered by insurance the other they had to pay $700 for. Obviously I am not exactly sure what is going on in this regard but she obviously does not need to have 2 sets of pumps. Fortunately there are no signs of infection in regard to patient's bilateral lower  extremities. She has been tolerating the dressing changes without complication. No fevers, chills, nausea, vomiting, or diarrhea. She was wearing compression stockings for some time until her leg started to weep. She was also using the lymphedema pumps according to her daughter who was present during the visit today until she started having issues with the weeping as well and then they were sure  that she continue to use them. 10/26/2018 on evaluation today patient appears to be doing well with regard to her lower extremity ulcers. In fact we have actually gotten everything down to just 2 open areas and on the left this is just one very small region in fact. Overall I feel like she has done excellent in the short amount of time that we have been taking care of her legs and overall and very pleased in this regard. No fevers, chills, nausea, vomiting, or diarrhea. Electronic Signature(s) Signed: 10/26/2018 11:42:09 AM By: Worthy Keeler PA-C Entered By: Worthy Keeler on 10/26/2018 11:42:09 [name], [name] ([id number]) -------------------------------------------------------------------------------- Physical Exam Details Patient Name: [name], Thomasenia M. Date of Service: 10/26/2018 10:30 AM Medical Record Number: [id number] Patient Account Number: [id number] Date of Birth/Sex: 05-01-1924 (85 y.o. F) Treating RN: Montey Hora Primary Care Provider: Myrtie Hawk Other Clinician: Referring Provider: Myrtie Hawk Treating Provider/Extender: STONE III, HOYT Weeks in Treatment: 1 Constitutional Well-nourished and well-hydrated in no acute distress. Respiratory normal breathing without difficulty. clear to auscultation bilaterally. Cardiovascular regular rate and rhythm with normal S1, S2. no clubbing, cyanosis, significant edema, <3 sec cap refill. Notes Patient's wounds currently both show signs of excellent epithelization with only minimal opening on the left  to be honest that wound is almost completely closed. In regard to the right lower extremity the wound is larger but again it was from the beginning as well. Nonetheless this is shown signs of excellent improvement in overall I am very pleased. Electronic Signature(s) Signed: 10/26/2018 11:46:23 AM By: Worthy Keeler PA-C Entered By: Worthy Keeler on 10/26/2018 11:46:23 [place] ([id number]) -------------------------------------------------------------------------------- Physician Orders Details Patient Name: [name], Vickki M. Date of Service: 10/26/2018 10:30 AM Medical Record Number: [id number] Patient Account Number: [id number] Date of Birth/Sex: 04-Dec-1924 (85 y.o. F) Treating RN: Montey Hora Primary Care Provider: Myrtie Hawk Other Clinician: Referring Provider: Myrtie Hawk Treating Provider/Extender: Melburn Hake, HOYT Weeks in Treatment: 1 Verbal / Phone Orders: No Diagnosis Coding ICD-10 Coding Code Description E11.622 Type 2 diabetes mellitus with other skin ulcer I89.0 Lymphedema, not elsewhere classified I87.2 Venous insufficiency (chronic) (peripheral) L97.822 Non-pressure chronic ulcer of other part of left lower leg with fat layer exposed L97.812 Non-pressure chronic ulcer of other part of right lower leg with fat layer exposed I10 Essential (primary) hypertension J44.9 Chronic obstructive pulmonary disease, unspecified Wound Cleansing Wound #2 Left,Medial,Posterior Lower Leg o Cleanse wound with mild soap and water o May shower with protection. - Please do not get your wraps wet Wound #4 Right,Medial Lower Leg o Cleanse wound with mild soap and water o May shower with protection. - Please do not get your wraps wet Primary Wound Dressing Wound #2 Left,Medial,Posterior Lower Leg o Silver Alginate Wound #4 Right,Medial Lower Leg o Silver Alginate Secondary Dressing Wound #2 Left,Medial,Posterior Lower  Leg o ABD pad Wound #4 Right,Medial Lower Leg o ABD pad Dressing Change Frequency o Other: - Twice Weekly - Friday and Tuesday Follow-up Appointments o Return Appointment in 1 week. Edema Control o 3 Layer Compression System - Bilateral o Compression Pump: Use compression pump on left lower extremity for 60 minutes, twice daily. SHOFFNER RENATHA, ROSEN. ([id number]) o Compression Pump: Use compression pump on right lower extremity for 60 minutes, twice daily. Electronic Signature(s) Signed: 10/26/2018 1:01:55 PM By: Montey Hora Signed: 10/28/2018 6:59:38 PM By: Worthy Keeler PA-C Entered By: Montey Hora on 10/26/2018 11:01:28 [name], [name] ([id number]) -------------------------------------------------------------------------------- Problem List  Details Patient Name: TANYIKA, BARROS. Date of Service: 10/26/2018 10:30 AM Medical Record Number: [id number] Patient Account Number: [id number] Date of Birth/Sex: 29-Apr-1924 (85 y.o. F) Treating RN: Montey Hora Primary Care Provider: Myrtie Hawk Other Clinician: Referring Provider: Myrtie Hawk Treating Provider/Extender: Melburn Hake, HOYT Weeks in Treatment: 1 Active Problems ICD-10 Evaluated Encounter Code Description Active Date Today Diagnosis E11.622 Type 2 diabetes mellitus with other skin ulcer 10/19/2018 No Yes I89.0 Lymphedema, not elsewhere classified 10/19/2018 No Yes I87.2 Venous insufficiency (chronic) (peripheral) 10/19/2018 No Yes L97.822 Non-pressure chronic ulcer of other part of left lower leg with 10/19/2018 No Yes fat layer exposed L97.812 Non-pressure chronic ulcer of other part of right lower leg 10/19/2018 No Yes with fat layer exposed I10 Essential (primary) hypertension 10/19/2018 No Yes J44.9 Chronic obstructive pulmonary disease, unspecified 10/19/2018 No Yes Inactive Problems Resolved Problems Electronic Signature(s) Signed: 10/26/2018 10:28:24 AM  By: Worthy Keeler PA-C Entered By: Worthy Keeler on 10/26/2018 10:28:24 [name], [name] ([id number]) -------------------------------------------------------------------------------- Progress Note Details Patient Name: [name], Rewa M. Date of Service: 10/26/2018 10:30 AM Medical Record Number: [id number] Patient Account Number: [id number] Date of Birth/Sex: 1924/09/24 (85 y.o. F) Treating RN: Montey Hora Primary Care Provider: Myrtie Hawk Other Clinician: Referring Provider: Myrtie Hawk Treating Provider/Extender: Melburn Hake, HOYT Weeks in Treatment: 1 Subjective Chief Complaint Information obtained from Patient Bilateral LE lymphedema History of Present Illness (HPI) 04/17/18 on evaluation today patient presents for initial inspection in our clinic concerning issues that she has been having with her gluteal region. Things actually appear to be doing much better at this point although we could go her daughter tells me that the patient was actually having areas that were draining in the gluteal region bilaterally. Fortunately these regions appear to have resolved. Still there is evidence of pressure injury to the gluteal region she has blanchable erythema but decreased capillary refill to the area which again is an early sign of pressure. Again I do think that her daily activity may be the cause of such. She does have a history of diabetes with a hemoglobin A1c of 7.8 on 04/11/18. She also has gout and hypertension. The patient does get around in her home according to what her daughter tells me. However she spends most of her day and actually her night as well in her recliner which I think is likely where the pressure is coming from. She tells me that she doesn't sleep in a bed and pretty much unless she's getting up to use the bathroom or eat she is in her recliner. This again I think is likely the biggest issue that we're seeing right now for  what's causing the pressure to her gluteal region. 08/02/18 on evaluation today patient actually presents for reevaluation in clinic concerning issues that she's been having with her gluteal region which the good news is appears to be completely healed at this time. There's no open wound currently. She did have bilateral lower extremity lymphedema which was starting to week more on the right lower extremity especially. With that being said this seems to be also doing better in resolving she's not wearing the compression stockings of I think she really is that compression stockings pretty much all the time. She does have TBI's that were performed and finalized on 04/30/18 they appear to be doing well with a right TBI of 0.70 in the left TBI of 0.98. Overall I feel like the patient is actually doing quite well although we do need  to have some things that we go through with them as far as teaching is concerned in order to prevent issues from showing up in the future. No fevers, chills, nausea, or vomiting noted at this time. 10/19/2018 upon evaluation today patient presents for reevaluation our clinic concerning issues that she is actually having with her bilateral lower extremities. I have seen her a couple times previous where we have discussed a wound on her gluteal region which actually was not giving her any trouble technically the last time I saw her. She is always had issues with her legs and swelling she does have lymphedema pumps. In fact by some crazy coincidence she actually ended up having them ordered from 2 different locations all likely from the congestive heart failure clinic as well as vascular and ended up with 2 sets of lymphedema pumps. Nonetheless 1 of them she got covered by insurance the other they had to pay $700 for. Obviously I am not exactly sure what is going on in this regard but she obviously does not need to have 2 sets of pumps. Fortunately there are no signs of infection in  regard to patient's bilateral lower extremities. She has been tolerating the dressing changes without complication. No fevers, chills, nausea, vomiting, or diarrhea. She was wearing compression stockings for some time until her leg started to weep. She was also using the lymphedema pumps according to her daughter who was present during the visit today until she started having issues with the weeping as well and then they were sure that she continue to use them. 10/26/2018 on evaluation today patient appears to be doing well with regard to her lower extremity ulcers. In fact we have actually gotten everything down to just 2 open areas and on the left this is just one very small region in fact. Overall I feel like she has done excellent in the short amount of time that we have been taking care of her legs and overall and very pleased in this regard. No fevers, chills, nausea, vomiting, or diarrhea. Patient History Information obtained from Patient. [place] ([id number]) Family History Cancer - Child, Diabetes - Siblings, Heart Disease - Father,Child, Hypertension - Child, Thyroid Problems - Siblings, No family history of Kidney Disease, Lung Disease, Seizures, Tuberculosis. Social History Never smoker, Marital Status - Widowed, Alcohol Use - Never, Drug Use - No History, Caffeine Use - Moderate. Medical History Eyes Patient has history of Cataracts - removed, Glaucoma Denies history of Optic Neuritis Ear/Nose/Mouth/Throat Denies history of Chronic sinus problems/congestion, Middle ear problems Hematologic/Lymphatic Denies history of Anemia, Hemophilia, Human Immunodeficiency Virus, Lymphedema, Sickle Cell Disease Respiratory Denies history of Aspiration, Asthma, Chronic Obstructive Pulmonary Disease (COPD), Pneumothorax, Sleep Apnea, Tuberculosis Cardiovascular Patient has history of Arrhythmia, Congestive Heart Failure, Coronary Artery Disease, Hypertension Denies history  of Angina, Hypotension, Myocardial Infarction, Peripheral Arterial Disease, Peripheral Venous Disease, Phlebitis, Vasculitis Gastrointestinal Denies history of Cirrhosis , Colitis, Crohn s, Hepatitis A, Hepatitis B, Hepatitis C Endocrine Patient has history of Type II Diabetes Denies history of Type I Diabetes Genitourinary Denies history of End Stage Renal Disease Immunological Denies history of Lupus Erythematosus, Raynaud s, Scleroderma Integumentary (Skin) Denies history of History of Burn, History of pressure wounds Musculoskeletal Patient has history of Gout, Osteoarthritis - bilateral knees Denies history of Rheumatoid Arthritis, Osteomyelitis Neurologic Denies history of Dementia, Neuropathy, Quadriplegia, Paraplegia, Seizure Disorder Oncologic Denies history of Received Chemotherapy, Received Radiation Psychiatric Denies history of Anorexia/bulimia, Confinement Anxiety Hospitalization/Surgery History - Heart valve  replacement. Review of Systems (ROS) Constitutional Symptoms (General Health) Denies complaints or symptoms of Fatigue, Fever, Chills, Marked Weight Change. Respiratory Denies complaints or symptoms of Chronic or frequent coughs, Shortness of Breath. Cardiovascular Complains or has symptoms of LE edema. Denies complaints or symptoms of Chest pain. Psychiatric Denies complaints or symptoms of Anxiety, Claustrophobia. [place] ([id number]) Objective Constitutional Well-nourished and well-hydrated in no acute distress. Vitals Time Taken: 10:31 AM, Height: 60 in, Weight: 190 lbs, BMI: 37.1, Temperature: 99.1 F, Pulse: 80 bpm, Respiratory Rate: 16 breaths/min, Blood Pressure: 175/72 mmHg. Respiratory normal breathing without difficulty. clear to auscultation bilaterally. Cardiovascular regular rate and rhythm with normal S1, S2. no clubbing, cyanosis, significant edema, General Notes: Patient's wounds currently both show signs of excellent  epithelization with only minimal opening on the left to be honest that wound is almost completely closed. In regard to the right lower extremity the wound is larger but again it was from the beginning as well. Nonetheless this is shown signs of excellent improvement in overall I am very pleased. Integumentary (Hair, Skin) Wound #1 status is Healed - Epithelialized. Original cause of wound was Blister. The wound is located on the Left,Anterior Lower Leg. The wound measures 0cm length x 0cm width x 0cm depth; 0cm^2 area and 0cm^3 volume. There is Fat Layer (Subcutaneous Tissue) Exposed exposed. There is no tunneling or undermining noted. There is a medium amount of serosanguineous drainage noted. There is small (1-33%) granulation within the wound bed. There is a large (67-100%) amount of necrotic tissue within the wound bed including Eschar and Adherent Slough. Wound #2 status is Open. Original cause of wound was Blister. The wound is located on the Left,Medial,Posterior Lower Leg. The wound measures 0.1cm length x 0.1cm width x 0.1cm depth; 0.008cm^2 area and 0.001cm^3 volume. There is Fat Layer (Subcutaneous Tissue) Exposed exposed. There is no tunneling or undermining noted. There is a medium amount of serosanguineous drainage noted. There is medium (34-66%) red granulation within the wound bed. There is a medium (34- 66%) amount of necrotic tissue within the wound bed including Adherent Slough. Wound #3 status is Healed - Epithelialized. Original cause of wound was Blister. The wound is located on the Left,Distal,Medial Lower Leg. The wound measures 0cm length x 0cm width x 0cm depth; 0cm^2 area and 0cm^3 volume. There is Fat Layer (Subcutaneous Tissue) Exposed exposed. There is no tunneling or undermining noted. There is a medium amount of serosanguineous drainage noted. There is small (1-33%) red granulation within the wound bed. There is a large (67-100%) amount of necrotic tissue within the  wound bed including Eschar and Adherent Slough. Wound #4 status is Open. Original cause of wound was Blister. The wound is located on the Right,Medial Lower Leg. The wound measures 3.5cm length x 2.5cm width x 0.1cm depth; 6.872cm^2 area and 0.687cm^3 volume. There is Fat Layer (Subcutaneous Tissue) Exposed exposed. There is no tunneling or undermining noted. There is a medium amount of sanguinous drainage noted. There is large (67-100%) red granulation within the wound bed. There is a small (1-33%) amount of necrotic tissue within the wound bed including Adherent Slough. Wound #5 status is Healed - Epithelialized. Original cause of wound was Blister. The wound is located on the Right,Anterior Lower Leg. The wound measures 0cm length x 0cm width x 0cm depth; 0cm^2 area and 0cm^3 volume. There is no tunneling or undermining noted. There is a medium amount of serosanguineous drainage noted. There is no granulation within the wound bed.  There is a large (67-100%) amount of necrotic tissue within the wound bed including Adherent Slough. Assessment SHOFFNER CONNA, TERADA ([id number]) Active Problems ICD-10 Type 2 diabetes mellitus with other skin ulcer Lymphedema, not elsewhere classified Venous insufficiency (chronic) (peripheral) Non-pressure chronic ulcer of other part of left lower leg with fat layer exposed Non-pressure chronic ulcer of other part of right lower leg with fat layer exposed Essential (primary) hypertension Chronic obstructive pulmonary disease, unspecified Procedures Wound #2 Pre-procedure diagnosis of Wound #2 is a Diabetic Wound/Ulcer of the Lower Extremity located on the Left,Medial,Posterior Lower Leg .Severity of Tissue Pre Debridement is: Fat layer exposed. There was a Chemical/Enzymatic/Mechanical debridement performed by STONE III, HOYT E., PA-C. With the following instrument(s): gauze after achieving pain control using Lidocaine 4% Topical Solution. Other agent  used was saline and gauze. A time out was conducted at 11:05, prior to the start of the procedure. There was no bleeding. The procedure was tolerated well with a pain level of 0 throughout and a pain level of 0 following the procedure. Post Debridement Measurements: 0.1cm length x 0.1cm width x 0.1cm depth; 0.001cm^3 volume. Character of Wound/Ulcer Post Debridement is improved. Severity of Tissue Post Debridement is: Fat layer exposed. Post procedure Diagnosis Wound #2: Same as Pre-Procedure Pre-procedure diagnosis of Wound #2 is a Diabetic Wound/Ulcer of the Lower Extremity located on the Left,Medial,Posterior Lower Leg . There was a Three Layer Compression Therapy Procedure with a pre-treatment ABI of 0.7 by Montey Hora, RN. Post procedure Diagnosis Wound #2: Same as Pre-Procedure Notes: bilateral ABI [place]; TBI L .70 and R .98. Wound #4 Pre-procedure diagnosis of Wound #4 is a Diabetic Wound/Ulcer of the Lower Extremity located on the Right,Medial Lower Leg .Severity of Tissue Pre Debridement is: Fat layer exposed. There was a Chemical/Enzymatic/Mechanical debridement performed by STONE III, HOYT E., PA-C. With the following instrument(s): gauze after achieving pain control using Lidocaine 4% Topical Solution. Other agent used was saline and gauze. A time out was conducted at 11:06, prior to the start of the procedure. There was no bleeding. The procedure was tolerated well with a pain level of 0 throughout and a pain level of 0 following the procedure. Post Debridement Measurements: 3.5cm length x 2.5cm width x 0.1cm depth; 0.687cm^3 volume. Character of Wound/Ulcer Post Debridement is improved. Severity of Tissue Post Debridement is: Fat layer exposed. Post procedure Diagnosis Wound #4: Same as Pre-Procedure Pre-procedure diagnosis of Wound #4 is a Diabetic Wound/Ulcer of the Lower Extremity located on the Right,Medial Lower Leg . There was a Three Layer Compression Therapy Procedure with  a pre-treatment ABI of 0.7 by Montey Hora, RN. Post procedure Diagnosis Wound #4: Same as Pre-Procedure Notes: bilateral ABI [place]; TBI L .70 and R .98. Plan SHOFFNER ARCHANA, ECKMAN ([id number]) Wound Cleansing: Wound #2 Left,Medial,Posterior Lower Leg: Cleanse wound with mild soap and water May shower with protection. - Please do not get your wraps wet Wound #4 Right,Medial Lower Leg: Cleanse wound with mild soap and water May shower with protection. - Please do not get your wraps wet Primary Wound Dressing: Wound #2 Left,Medial,Posterior Lower Leg: Silver Alginate Wound #4 Right,Medial Lower Leg: Silver Alginate Secondary Dressing: Wound #2 Left,Medial,Posterior Lower Leg: ABD pad Wound #4 Right,Medial Lower Leg: ABD pad Dressing Change Frequency: Other: - Twice Weekly - Friday and Tuesday Follow-up Appointments: Return Appointment in 1 week. Edema Control: 3 Layer Compression System - Bilateral Compression Pump: Use compression pump on left lower extremity for 60 minutes, twice daily. Compression  Pump: Use compression pump on right lower extremity for 60 minutes, twice daily. 1. I would recommend that we continue with the 3 layer compression wrap as this seems to be doing very well for the patient. 2. I am also going to suggest that we continue with the silver alginate dressing since this seems to be doing a great job for the patient she is healing quite nicely. 3. I still recommend that she elevate her legs as much as possible in order to keep things under control from a swelling perspective. We will see patient back for reevaluation in 1 week here in the clinic. If anything worsens or changes patient will contact our office for additional recommendations. Electronic Signature(s) Signed: 10/26/2018 11:47:26 AM By: Worthy Keeler PA-C Entered By: Worthy Keeler on 10/26/2018 11:47:26 [place]  ([id number]) -------------------------------------------------------------------------------- ROS/PFSH Details Patient Name: [name], Margel M. Date of Service: 10/26/2018 10:30 AM Medical Record Number: [id number] Patient Account Number: [id number] Date of Birth/Sex: 08-08-1924 (85 y.o. F) Treating RN: Montey Hora Primary Care Provider: Myrtie Hawk Other Clinician: Referring Provider: Myrtie Hawk Treating Provider/Extender: Melburn Hake, HOYT Weeks in Treatment: 1 Information Obtained From Patient Constitutional Symptoms (General Health) Complaints and Symptoms: Negative for: Fatigue; Fever; Chills; Marked Weight Change Respiratory Complaints and Symptoms: Negative for: Chronic or frequent coughs; Shortness of Breath Medical History: Negative for: Aspiration; Asthma; Chronic Obstructive Pulmonary Disease (COPD); Pneumothorax; Sleep Apnea; Tuberculosis Cardiovascular Complaints and Symptoms: Positive for: LE edema Negative for: Chest pain Medical History: Positive for: Arrhythmia; Congestive Heart Failure; Coronary Artery Disease; Hypertension Negative for: Angina; Hypotension; Myocardial Infarction; Peripheral Arterial Disease; Peripheral Venous Disease; Phlebitis; Vasculitis Psychiatric Complaints and Symptoms: Negative for: Anxiety; Claustrophobia Medical History: Negative for: Anorexia/bulimia; Confinement Anxiety Eyes Medical History: Positive for: Cataracts - removed; Glaucoma Negative for: Optic Neuritis Ear/Nose/Mouth/Throat Medical History: Negative for: Chronic sinus problems/congestion; Middle ear problems Hematologic/Lymphatic SHOFFNER CECILLIA, MENEES. ([id number]) Medical History: Negative for: Anemia; Hemophilia; Human Immunodeficiency Virus; Lymphedema; Sickle Cell Disease Gastrointestinal Medical History: Negative for: Cirrhosis ; Colitis; Crohnos; Hepatitis A; Hepatitis B; Hepatitis C Endocrine Medical History: Positive  for: Type II Diabetes Negative for: Type I Diabetes Time with diabetes: 20 years Treated with: Oral agents Blood sugar tested every day: Yes Tested : twice daily Genitourinary Medical History: Negative for: End Stage Renal Disease Immunological Medical History: Negative for: Lupus Erythematosus; Raynaudos; Scleroderma Integumentary (Skin) Medical History: Negative for: History of Burn; History of pressure wounds Musculoskeletal Medical History: Positive for: Gout; Osteoarthritis - bilateral knees Negative for: Rheumatoid Arthritis; Osteomyelitis Neurologic Medical History: Negative for: Dementia; Neuropathy; Quadriplegia; Paraplegia; Seizure Disorder Oncologic Medical History: Negative for: Received Chemotherapy; Received Radiation HBO Extended History Items Eyes: Eyes: Cataracts Glaucoma Immunizations Pneumococcal Vaccine: Received Pneumococcal Vaccination: Yes Implantable Devices [place] ([id number]) None Hospitalization / Surgery History Type of Hospitalization/Surgery Heart valve replacement Family and Social History Cancer: Yes - Child; Diabetes: Yes - Siblings; Heart Disease: Yes - Father,Child; Hypertension: Yes - Child; Kidney Disease: No; Lung Disease: No; Seizures: No; Thyroid Problems: Yes - Siblings; Tuberculosis: No; Never smoker; Marital Status - Widowed; Alcohol Use: Never; Drug Use: No History; Caffeine Use: Moderate Physician Affirmation I have reviewed and agree with the above information. Electronic Signature(s) Signed: 10/26/2018 1:01:55 PM By: Montey Hora Signed: 10/28/2018 6:59:38 PM By: Worthy Keeler PA-C Entered By: Worthy Keeler on 10/26/2018 11:42:27 [place], [name] ([id number]) -------------------------------------------------------------------------------- SuperBill Details Patient Name: [name], Tawnia M. Date of Service: 10/26/2018 Medical Record Number: [id number]  Patient Account Number:  [id number] Date of Birth/Sex: [birth year] (85 y.o. F) Treating RN: Montey Hora Primary Care Provider: Myrtie Hawk Other Clinician: Referring Provider: Myrtie Hawk Treating Provider/Extender: Melburn Hake, HOYT Weeks in Treatment: 1 Diagnosis Coding ICD-10 Codes Code Description E11.622 Type 2 diabetes mellitus with other skin ulcer I89.0 Lymphedema, not elsewhere classified I87.2 Venous insufficiency (chronic) (peripheral) L97.822 Non-pressure chronic ulcer of other part of left lower leg with fat layer exposed L97.812 Non-pressure chronic ulcer of other part of right lower leg with fat layer exposed I10 Essential (primary) hypertension J44.9 Chronic obstructive pulmonary disease, unspecified Facility Procedures CPT4: Description Modifier Quantity Code [id number] BILATERAL: Application of multi-layer venous compression system; leg (below 1 knee), including ankle and foot. Physician Procedures CPT4 Code Description: [id number] - WC PHYS LEVEL 4 - EST PT ICD-10 Diagnosis Description E11.622 Type 2 diabetes mellitus with other skin ulcer I89.0 Lymphedema, not elsewhere classified I87.2 Venous insufficiency (chronic) (peripheral) L97.822  Non-pressure chronic ulcer of other part of left lower leg wit Modifier: h fat layer expose Quantity: 1 d Electronic Signature(s) Signed: 10/26/2018 11:48:44 AM By: Worthy Keeler PA-C Entered By: Worthy Keeler on 10/26/2018 11:48:44

## 2018-10-26 NOTE — Progress Notes (Addendum)
[place] ([id number]) Visit Report for 10/26/2018 Arrival Information Details Patient Name: [name], [name]. Date of Service: 10/26/2018 10:30 AM Medical Record Number: [id number] Patient Account Number: [id number] Date of Birth/Sex: 04-21-1924 (85 y.o. F) Treating RN: Army Melia Primary Care Jefte Carithers: Myrtie Hawk Other Clinician: Referring Maanya Hippert: Myrtie Hawk Treating Eleonore Shippee/Extender: Melburn Hake, HOYT Weeks in Treatment: 1 Visit Information History Since Last Visit Added or deleted any medications: No Patient Arrived: Wheel Chair Any new allergies or adverse reactions: No Arrival Time: 10:30 Had a fall or experienced change in No Accompanied By: daughter activities of daily living that may affect Transfer Assistance: None risk of falls: Patient Has Alerts: Yes Signs or symptoms of abuse/neglect since last visito No Patient Alerts: ABI 04/30/18 L [place] [facility] [place] Hospitalized since last visit: No TBI L .70 R .98 Has Dressing in Place as Prescribed: Yes DMII Pain Present Now: No Electronic Signature(s) Signed: 10/26/2018 3:12:16 PM By: Army Melia Entered By: Army Melia on 10/26/2018 10:31:17 [name], [name] ([id number]) -------------------------------------------------------------------------------- Compression Therapy Details Patient Name: [name], Cassandra M. Date of Service: 10/26/2018 10:30 AM Medical Record Number: [id number] Patient Account Number: [id number] Date of Birth/Sex: [birth year] (85 y.o. F) Treating RN: Montey Hora Primary Care Jorian [place]ho[place]: Myrtie Hawk Other Clinician: Referring Venola Castello: Myrtie Hawk Treating Markee Matera/Extender: Melburn Hake, HOYT Weeks in Treatment: 1 Compression Therapy Performed for Wound Assessment: Wound #2 Left,Medial,Posterior Lower Leg Performed By: Clinician Montey Hora, RN Compression Type: Three Layer Pre Treatment ABI: 0.7 Post Procedure  Diagnosis Same as Pre-procedure Notes bilateral ABI [place]; TBI L .70 and R .98 Electronic Signature(s) Signed: 10/26/2018 11:27:12 AM By: Montey Hora Entered By: Montey Hora on 10/26/2018 11:27:11 [name], [name] ([id number]) -------------------------------------------------------------------------------- Compression Therapy Details Patient Name: [name], Cassandra M. Date of Service: 10/26/2018 10:30 AM Medical Record Number: [id number] Patient Account Number: [id number] Date of Birth/Sex: Nov 27, 1924 (85 y.o. F) Treating RN: Montey Hora Primary Care Ibn Stief: Myrtie Hawk Other Clinician: Referring Dason Mosley: Myrtie Hawk Treating Jalaiyah Throgmorton/Extender: Melburn Hake, HOYT Weeks in Treatment: 1 Compression Therapy Performed for Wound Assessment: Wound #4 Right,Medial Lower Leg Performed By: Clinician Montey Hora, RN Compression Type: Three Layer Pre Treatment ABI: 0.7 Post Procedure Diagnosis Same as Pre-procedure Notes bilateral ABI [place]; TBI L .70 and R .98 Electronic Signature(s) Signed: 10/26/2018 11:27:12 AM By: Montey Hora Entered By: Montey Hora on 10/26/2018 11:27:12 [name], [name] ([id number]) -------------------------------------------------------------------------------- Encounter Discharge Information Details Patient Name: [name], Cassandra M. Date of Service: 10/26/2018 10:30 AM Medical Record Number: [id number] Patient Account Number: [id number] Date of Birth/Sex: 05/23/1924 (85 y.o. F) Treating RN: Montey Hora Primary Care Roxann Vierra: Myrtie Hawk Other Clinician: Referring Adonna Horsley: Myrtie Hawk Treating Rennae Ferraiolo/Extender: Melburn Hake, HOYT Weeks in Treatment: 1 Encounter Discharge Information Items Post Procedure Vitals Discharge Condition: Stable Temperature (F): 99.1 Ambulatory Status: Wheelchair Pulse (bpm): 80 Discharge Destination: Home Respiratory Rate (breaths/min):  16 Transportation: Private Auto Blood Pressure (mmHg): 175/72 Accompanied By: daughter Schedule Follow-up Appointment: Yes Clinical Summary of Care: Electronic Signature(s) Signed: 10/26/2018 1:01:55 PM By: Montey Hora Entered By: Montey Hora on 10/26/2018 11:31:45 [name], [name] ([id number]) -------------------------------------------------------------------------------- Lower Extremity Assessment Details Patient Name: [name], [name] Date of Service: 10/26/2018 10:30 AM Medical Record Number: [id number] Patient Account Number: [id number] Date of Birth/Sex: [birth year] (85 y.o. F) Treating RN: Army Melia Primary Care Garner Dullea: Myrtie Hawk Other Clinician: Referring Cordarro Spinnato: Myrtie Hawk Treating Trace Cederberg/Extender: STONE III, HOYT Weeks in Treatment: 1 Edema Assessment Assessed: [Left: No] [Right: No] Edema: [Left: Yes] [  Right: Yes] Calf Left: Right: Point of Measurement: 39 cm From Medial Instep 32 cm 37 cm Ankle Left: Right: Point of Measurement: 27 cm From Medial Instep 10 cm 10 cm Vascular Assessment Pulses: Dorsalis Pedis Palpable: [Left:Yes] [Right:Yes] Electronic Signature(s) Signed: 10/26/2018 3:12:16 PM By: Army Melia Entered By: Army Melia on 10/26/2018 10:45:15 [name], [name] ([id number]) -------------------------------------------------------------------------------- Multi Wound Chart Details Patient Name: [name], Cassandra M. Date of Service: 10/26/2018 10:30 AM Medical Record Number: [id number] Patient Account Number: [id number] Date of Birth/Sex: 18-May-1924 (85 y.o. 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 Lower Extremity Lower Extremity Comorbid History: Cataracts, Glaucoma, Cataracts, Glaucoma, Cataracts, Glaucoma, Arrhythmia, Congestive Heart Arrhythmia, Congestive Heart Arrhythmia, Congestive Heart Failure, Coronary Artery Failure, Coronary Artery Failure, Coronary Artery Disease, Hypertension, Type II Disease, Hypertension, Type II Disease, Hypertension, Type II Diabetes, Gout, Osteoarthritis Diabetes, Gout, Osteoarthritis Diabetes, Gout, Osteoarthritis Date Acquired: 10/13/2018 10/13/2018 10/13/2018 Weeks of Treatment: 1 1 1  Wound Status: Healed - Epithelialized Open Healed - Epithelialized Measurements L x W x D 0x0x0 0.1x0.1x0.1 0x0x0 (cm) Area (cm) : 0 0.008 0 Volume (cm) : 0 0.001 0 % Reduction in Area: 100.00% 99.50% 100.00% % Reduction in Volume: 100.00% 99.40% 100.00% Classification: Grade 2 Grade 2 Grade 2 Exudate Amount: Medium Medium Medium Exudate Type: Serosanguineous Serosanguineous Serosanguineous Exudate Color: red, brown red, brown red, brown Granulation Amount: Small (1-33%) Medium (34-66%) Small (1-33%) Granulation Quality: N/A Red Red Necrotic Amount: Large (67-100%) Medium (34-66%) Large (67-100%) Necrotic Tissue: Eschar, Adherent Slough Adherent [facility], Adherent Slough Exposed Structures: Fat Layer (Subcutaneous Fat Layer (Subcutaneous Fat Layer (Subcutaneous Tissue) Exposed: Yes Tissue) Exposed: Yes Tissue) Exposed: Yes [name], Shaquela M. ([id number]) Fascia: No Fascia: No Fascia: No Tendon: No Tendon: No Tendon: No Muscle: No Muscle: No Muscle: No Joint: No Joint: No Joint: No Bone: No Bone:  No Bone: No Epithelialization: Medium (34-66%) None None Wound Number: 4 5 N/A Photos: N/A Wound Location: Right Lower Leg - Medial Right, Anterior Lower Leg N/A Wounding Event: Blister Blister N/A Primary Etiology: Diabetic Wound/Ulcer of the Diabetic Wound/Ulcer of the N/A Lower Extremity Lower Extremity Comorbid History: Cataracts, Glaucoma, Cataracts, Glaucoma, N/A Arrhythmia, Congestive Heart Arrhythmia, Congestive Heart Failure, Coronary Artery Failure, Coronary Artery Disease, Hypertension, Type II Disease, Hypertension, Type II Diabetes, Gout, Osteoarthritis Diabetes, Gout, Osteoarthritis Date Acquired: 10/13/2018 10/13/2018 N/A Weeks of Treatment: 1 1 N/A Wound Status: Open Healed - Epithelialized N/A Measurements L x W x D 3.5x2.5x0.1 0x0x0 N/A (cm) Area (cm) : 6.872 0 N/A Volume (cm) : 0.687 0 N/A % Reduction in Area: -12.20% 100.00% N/A % Reduction in Volume: -12.10% 100.00% N/A Classification: Grade 2 Grade 2 N/A Exudate Amount: Medium Medium N/A Exudate Type: Sanguinous Serosanguineous N/A Exudate Color: red red, brown N/A Granulation Amount: Large (67-100%) None Present (0%) N/A Granulation Quality: Red N/A N/A Necrotic Amount: Small (1-33%) Large (67-100%) N/A Necrotic Tissue: Adherent Slough Adherent Slough N/A Exposed Structures: Fat Layer (Subcutaneous Fascia: No N/A Tissue) Exposed: Yes Fat Layer (Subcutaneous Fascia: No Tissue) Exposed: No Tendon: No Tendon: No Muscle:  No Muscle: No Joint: No Joint: No Bone: No Bone: No Epithelialization: None None N/A Treatment Notes Electronic Signature(s) Signed: 10/26/2018 1:01:55 PM By: Montey Hora Entered By: Montey Hora on 10/26/2018 10:59:28 [name], [name] ([id number]) [place], [name] ([id number]) -------------------------------------------------------------------------------- [place] Details Patient Name: [name], Cynia M. Date of Service:  10/26/2018 10:30 AM Medical Record Number: [id number] Patient Account Number: [id number] Date of Birth/Sex: 1924-11-06 (85 y.o. F) Treating RN: Montey Hora Primary Care Dylan Ruotolo: Myrtie Hawk Other Clinician: Referring Vandy Tsuchiya: Myrtie Hawk Treating Prudence Heiny/Extender: Melburn Hake, HOYT Weeks in Treatment: 1 Active Inactive Abuse / Safety / Falls / Self Care Management Nursing Diagnoses: Potential for falls Goals: Patient will not experience any injury related to falls Date Initiated: 10/19/2018 Target Resolution Date: 01/05/2019 Goal Status: Active Interventions: Assess fall risk on admission and as needed Notes: Nutrition Nursing Diagnoses: Impaired glucose control: actual or potential Goals: Patient/caregiver agrees to and verbalizes understanding of need to use nutritional supplements and/or vitamins as prescribed Date Initiated: 10/19/2018 Target Resolution Date: 01/05/2019 Goal Status: Active Interventions: Provide education on elevated blood sugars and impact on wound healing Notes: Orientation to the Wound Care Program Nursing Diagnoses: Knowledge deficit related to the wound healing center program Goals: Patient/caregiver will verbalize understanding of the [place] Program Date Initiated: 10/19/2018 Target Resolution Date: 01/05/2019 Goal Status: Active Interventions: Provide education on orientation to the wound center [facility] SIMMONS, Sherleen M. ([id number]) Notes: Venous Leg Ulcer Nursing Diagnoses: Potential for venous Insuffiency (use before diagnosis confirmed) Goals: Patient will maintain optimal edema control Date Initiated: 10/19/2018 Target Resolution Date: 01/05/2019 Goal Status: Active Interventions: Compression as ordered Notes: Wound/Skin Impairment Nursing Diagnoses: Impaired tissue integrity Goals: Ulcer/skin breakdown will heal within 14 weeks Date Initiated: 10/19/2018 Target Resolution Date: 01/05/2019 Goal  Status: Active Interventions: Assess patient/caregiver ability to obtain necessary supplies Assess patient/caregiver ability to perform ulcer/skin care regimen upon admission and as needed Assess ulceration(s) every visit Notes: Electronic Signature(s) Signed: 10/26/2018 1:01:55 PM By: Montey Hora Entered By: Montey Hora on 10/26/2018 10:59:17 [name], [name] ([id number]) -------------------------------------------------------------------------------- Pain Assessment Details Patient Name: [name], Mykah M. Date of Service: 10/26/2018 10:30 AM Medical Record Number: [id number] Patient Account Number: [id number] Date of Birth/Sex: 23-Oct-1924 (85 y.o. F) Treating RN: Army Melia Primary Care Brya Simerly: Myrtie Hawk Other Clinician: Referring Fathima Bartl: Myrtie Hawk Treating Johnesha Acheampong/Extender: Melburn Hake, HOYT Weeks in Treatment: 1 Active Problems Location of Pain Severity and Description of Pain Patient Has Paino No Site Locations Pain Management and Medication Current Pain Management: Electronic Signature(s) Signed: 10/26/2018 3:12:16 PM By: Army Melia Entered By: Army Melia on 10/26/2018 10:31:23 [name], [name] ([id number]) -------------------------------------------------------------------------------- Patient/Caregiver Education Details Patient Name: [name], Kyndell M. Date of Service: 10/26/2018 10:30 AM Medical Record Number: [id number] Patient Account Number: [id number] Date of Birth/Gender: [birth year] (85 y.o. F) Treating RN: Montey Hora Primary Care Physician: Myrtie Hawk Other Clinician: Referring Physician: Myrtie Hawk Treating Physician/Extender: Sharalyn Ink in Treatment: 1 Education Assessment Education Provided To: Patient and Caregiver Education Topics Provided Venous: Handouts: Other: need for ongoing compression Methods: Demonstration, Explain/Verbal Responses:  State content correctly Electronic Signature(s) Signed: 10/26/2018 1:01:55 PM By: Montey Hora Entered By: Montey Hora on 10/26/2018 11:30:35 [name], [name] ([id number]) -------------------------------------------------------------------------------- Wound Assessment Details Patient Name: [name], Keeghan M. Date of Service: 10/26/2018 10:30 AM Medical Record Number: [id number] Patient Account Number: [id number] Date of Birth/Sex: 29-Jun-1924 (85 y.o. F) Treating RN: Montey Hora Primary Care Nerida Boivin: Myrtie Hawk Other Clinician: Referring  Aleiyah Halpin: Myrtie Hawk Treating Josee Speece/Extender: STONE III, HOYT Weeks in Treatment: 1 Wound Status Wound Number: 1 Primary Diabetic Wound/Ulcer of the Lower Extremity Etiology: Wound Location: Left, Anterior Lower Leg Wound Healed - Epithelialized Wounding Event: Blister Status: Date Acquired: 10/13/2018 Comorbid Cataracts, Glaucoma, Arrhythmia, Congestive Weeks Of Treatment: 1 History: Heart Failure, Coronary Artery Disease, Clustered Wound: No Hypertension, Type II Diabetes, Gout, Osteoarthritis Photos Wound Measurements Length: (cm) 0 % Reduc Width: (cm) 0 % Reduc Depth: (cm) 0 Epithel Area: (cm) 0 Tunnel Volume: (cm) 0 Underm tion in Area: 100% tion in Volume: 100% ialization: Medium (34-66%) ing: No ining: No Wound Description Classification: Grade 2 Foul Od Exudate Amount: Medium Slough/ Exudate Type: Serosanguineous Exudate Color: red, brown or After Cleansing: No Fibrino Yes Wound Bed Granulation Amount: Small (1-33%) Exposed Structure Necrotic Amount: Large (67-100%) Fascia Exposed: No Necrotic Quality: Eschar, Adherent Slough Fat Layer (Subcutaneous Tissue) Exposed: Yes Tendon Exposed: No Muscle Exposed: No Joint Exposed: No Bone Exposed: No Electronic Signature(s) SHOFFNER ANASTASHIA, [place] ([id number]) Signed: 10/26/2018 1:01:55 PM By: Montey Hora Entered By:  Montey Hora on 10/26/2018 10:58:11 [name], [name] ([id number]) -------------------------------------------------------------------------------- Wound Assessment Details Patient Name: [name], Yilin M. Date of Service: 10/26/2018 10:30 AM Medical Record Number: [id number] Patient Account Number: [id number] Date of Birth/Sex: 1924-06-16 (85 y.o. F) Treating RN: Montey Hora Primary Care Darcel Zick: Myrtie Hawk Other Clinician: Referring Brodyn Depuy: Myrtie Hawk Treating Mikiyah Glasner/Extender: Melburn Hake, HOYT Weeks in Treatment: 1 Wound Status Wound Number: 2 Primary Diabetic Wound/Ulcer of the Lower Extremity Etiology: Wound Location: Left, Medial, Posterior Lower Leg Wound Open Wounding Event: Blister Status: Date Acquired: 10/13/2018 Comorbid Cataracts, Glaucoma, Arrhythmia, Congestive Weeks Of Treatment: 1 History: Heart Failure, Coronary Artery Disease, Clustered Wound: No Hypertension, Type II Diabetes, Gout, Osteoarthritis Photos Wound Measurements Length: (cm) 0.1 Width: (cm) 0.1 Depth: (cm) 0.1 Area: (cm) 0.008 Volume: (cm) 0.001 % Reduction in Area: 99.5% % Reduction in Volume: 99.4% Epithelialization: None Tunneling: No Undermining: No Wound Description Classification: Grade 2 Exudate Amount: Medium Exudate Type: Serosanguineous Exudate Color: red, brown Foul Odor After Cleansing: No Slough/Fibrino Yes Wound Bed Granulation Amount: Medium (34-66%) Exposed Structure Granulation Quality: Red Fascia Exposed: No Necrotic Amount: Medium (34-66%) Fat Layer (Subcutaneous Tissue) Exposed: Yes Necrotic Quality: Adherent Slough Tendon Exposed: No Muscle Exposed: No Joint Exposed: No Bone Exposed: No Treatment Notes [name], Adyline M. ([id number]) Wound #2 (Left, Medial, Posterior Lower Leg) Notes silvercel, abd, 3 layer wrap bilateral Electronic Signature(s) Signed: 10/26/2018 1:01:55 PM By: Montey Hora Entered  By: Montey Hora on 10/26/2018 10:57:56 [name], [name] ([id number]) -------------------------------------------------------------------------------- Wound Assessment Details Patient Name: [name], Lynelle M. Date of Service: 10/26/2018 10:30 AM Medical Record Number: [id number] Patient Account Number: [id number] Date of Birth/Sex: 20-Nov-1924 (85 y.o. F) Treating RN: Montey Hora Primary Care Dafne Nield: Myrtie Hawk Other Clinician: Referring Gareth Fitzner: Myrtie Hawk Treating Eryk Beavers/Extender: STONE III, HOYT Weeks in Treatment: 1 Wound Status Wound Number: 3 Primary Diabetic Wound/Ulcer of the Lower Extremity Etiology: Wound Location: Left, Distal, Medial Lower Leg Wound Healed - Epithelialized Wounding Event: Blister Status: Date Acquired: 10/13/2018 Comorbid Cataracts, Glaucoma, Arrhythmia, Congestive Weeks Of Treatment: 1 History: Heart Failure, Coronary Artery Disease, Clustered Wound: No Hypertension, Type II Diabetes, Gout, Osteoarthritis Photos Wound Measurements Length: (cm) 0 % Reduc Width: (cm) 0 % Reduc Depth: (cm) 0 Epithel Area: (cm) 0 Tunnel Volume: (cm) 0 Underm tion in Area: 100% tion in Volume: 100% ialization: None ing: No ining: No Wound Description Classification: Grade 2 Exudate Amount: Medium Exudate  Type: Serosanguineous Exudate Color: red, brown Foul Odor After Cleansing: No Slough/Fibrino Yes Wound Bed Granulation Amount: Small (1-33%) Exposed Structure Granulation Quality: Red Fascia Exposed: No Necrotic Amount: Large (67-100%) Fat Layer (Subcutaneous Tissue) Exposed: Yes Necrotic Quality: Eschar, Adherent Slough Tendon Exposed: No Muscle Exposed: No Joint Exposed: No Bone Exposed: No Electronic Signature(s) SHOFFNER KATHERINE, TOUT ([id number]) Signed: 10/26/2018 1:01:55 PM By: Montey Hora Entered By: Montey Hora on 10/26/2018 10:57:56 [name], [name]  ([id number]) -------------------------------------------------------------------------------- Wound Assessment Details Patient Name: [name], Demara M. Date of Service: 10/26/2018 10:30 AM Medical Record Number: [id number] Patient Account Number: [id number] Date of Birth/Sex: [birth year] (85 y.o. F) Treating RN: Army Melia Primary Care Glenora Morocho: Myrtie Hawk Other Clinician: Referring Zohair Epp: Myrtie Hawk Treating Dazaria Macneill/Extender: Melburn Hake, HOYT Weeks in Treatment: 1 Wound Status Wound Number: 4 Primary Diabetic Wound/Ulcer of the Lower Extremity Etiology: Wound Location: Right Lower Leg - Medial Wound Open Wounding Event: Blister Status: Date Acquired: 10/13/2018 Comorbid Cataracts, Glaucoma, Arrhythmia, Congestive Weeks Of Treatment: 1 History: Heart Failure, Coronary Artery Disease, Clustered Wound: No Hypertension, Type II Diabetes, Gout, Osteoarthritis Photos Wound Measurements Length: (cm) 3.5 % Reduction Width: (cm) 2.5 % Reduction Depth: (cm) 0.1 Epithelializ Area: (cm) 6.872 Tunneling: Volume: (cm) 0.687 Undermining in Area: -12.2% in Volume: -12.1% ation: None No : No Wound Description Classification: Grade 2 Foul Odor Af Exudate Amount: Medium Slough/Fibri Exudate Type: Sanguinous Exudate Color: red ter Cleansing: No no Yes Wound Bed Granulation Amount: Large (67-100%) Exposed Structure Granulation Quality: Red Fascia Exposed: No Necrotic Amount: Small (1-33%) Fat Layer (Subcutaneous Tissue) Exposed: Yes Necrotic Quality: Adherent Slough Tendon Exposed: No Muscle Exposed: No Joint Exposed: No Bone Exposed: No Treatment Notes [name], Sharleen M. ([id number]) Wound #4 (Right, Medial Lower Leg) Notes silvercel, abd, 3 layer wrap bilateral Electronic Signature(s) Signed: 10/26/2018 3:12:16 PM By: Army Melia Entered By: Army Melia on 10/26/2018 10:44:24 [name], [name]  ([id number]) -------------------------------------------------------------------------------- Wound Assessment Details Patient Name: [name], Gabriellia M. Date of Service: 10/26/2018 10:30 AM Medical Record Number: [id number] Patient Account Number: [id number] Date of Birth/Sex: 07/20/1924 (85 y.o. F) Treating RN: Montey Hora Primary Care Tarina Volk: Myrtie Hawk Other Clinician: Referring Kelyn Ponciano: Myrtie Hawk Treating Lakira Ogando/Extender: Melburn Hake, HOYT Weeks in Treatment: 1 Wound Status Wound Number: 5 Primary Diabetic Wound/Ulcer of the Lower Extremity Etiology: Wound Location: Right, Anterior Lower Leg Wound Healed - Epithelialized Wounding Event: Blister Status: Date Acquired: 10/13/2018 Comorbid Cataracts, Glaucoma, Arrhythmia, Congestive Weeks Of Treatment: 1 History: Heart Failure, Coronary Artery Disease, Clustered Wound: No Hypertension, Type II Diabetes, Gout, Osteoarthritis Photos Wound Measurements Length: (cm) 0 % Reductio Width: (cm) 0 % Reductio Depth: (cm) 0 Epithelial Area: (cm) 0 Tunneling Volume: (cm) 0 Undermini n in Area: 100% n in Volume: 100% ization: None : No ng: No Wound Description Classification: Grade 2 Foul Odor Exudate Amount: Medium Slough/Fib Exudate Type: Serosanguineous Exudate Color: red, brown After Cleansing: No rino Yes Wound Bed Granulation Amount: None Present (0%) Exposed Structure Necrotic Amount: Large (67-100%) Fascia Exposed: No Necrotic Quality: Adherent Slough Fat Layer (Subcutaneous Tissue) Exposed: No Tendon Exposed: No Muscle Exposed: No Joint Exposed: No Bone Exposed: No Electronic Signature(s) SHOFFNER MAHALIA, DYKES ([id number]) Signed: 10/26/2018 1:01:55 PM By: Montey Hora Entered By: Montey Hora on 10/26/2018 10:59:05 [name], [name] ([id number]) -------------------------------------------------------------------------------- Vitals Details Patient Name:  [name], Gaetana M. Date of Service: 10/26/2018 10:30 AM Medical Record Number: [id number] Patient Account Number: [id number] Date of Birth/Sex: 1924/03/24 (85 y.o. F) Treating RN: Army Melia  Primary Care Charleene Callegari: Myrtie Hawk Other Clinician: Referring Tiondra Fang: Myrtie Hawk Treating Alexsandria Kivett/Extender: Melburn Hake, HOYT Weeks in Treatment: 1 Vital Signs Time Taken: 10:31 Temperature (F): 99.1 Height (in): 60 Pulse (bpm): 80 Weight (lbs): 190 Respiratory Rate (breaths/min): 16 Body Mass Index (BMI): 37.1 Blood Pressure (mmHg): 175/72 Reference Range: 80 - 120 mg / dl Electronic Signature(s) Signed: 10/26/2018 3:12:16 PM By: Army Melia Entered By: Army Melia on 10/26/2018 10:32:03

## 2018-11-01 ENCOUNTER — Telehealth: Payer: Self-pay

## 2018-11-01 ENCOUNTER — Other Ambulatory Visit: Payer: Self-pay | Admitting: Internal Medicine

## 2018-11-01 DIAGNOSIS — E1165 Type 2 diabetes mellitus with hyperglycemia: Secondary | ICD-10-CM

## 2018-11-01 MED ORDER — GLIPIZIDE 5 MG PO TABS: 5.0000 mg | ORAL_TABLET | Freq: Two times a day (BID) | ORAL | 3 refills | Status: DC

## 2018-11-01 NOTE — Telephone Encounter (Signed)
Copied from [place] [phone]. Topic: General - Other >> Nov 01, 2018 10:54 AM Antonieta Iba C wrote: Reason for CRM: pt's daughter called in to be advised, pt's sugar readings is still reading a little high, daughter would like to know if provider would want to increase glipiZIDE (GLUCOTROL) 5 MG tablet  to a whole tablet? Pt sugar is running in the 200.   [name]- [phone]

## 2018-11-01 NOTE — Telephone Encounter (Signed)
Spoke to [name]'s daughter, [name].  Daughter stated her mother's bs reading this morning was 216.  Pt's daughter wants to know if provider would like to increase the glipizide tablet.  [name] is currently taking 1/2 of 5mg  tab twice a day.  Please advise.  [name] ([name]'s daughter)- [phone]  [name]- [phone]

## 2018-11-01 NOTE — Telephone Encounter (Signed)
Yes lets increase glipizide from 2.5 mg 2x per day to 5 mg daily always with food Inform crystal   [place]

## 2018-11-02 ENCOUNTER — Encounter: Payer: PPO | Attending: Physician Assistant | Admitting: Physician Assistant

## 2018-11-02 ENCOUNTER — Other Ambulatory Visit: Payer: Self-pay

## 2018-11-02 DIAGNOSIS — I2510 Atherosclerotic heart disease of native coronary artery without angina pectoris: Secondary | ICD-10-CM | POA: Diagnosis not present

## 2018-11-02 DIAGNOSIS — H409 Unspecified glaucoma: Secondary | ICD-10-CM | POA: Insufficient documentation

## 2018-11-02 DIAGNOSIS — L97812 Non-pressure chronic ulcer of other part of right lower leg with fat layer exposed: Secondary | ICD-10-CM | POA: Insufficient documentation

## 2018-11-02 DIAGNOSIS — I890 Lymphedema, not elsewhere classified: Secondary | ICD-10-CM | POA: Insufficient documentation

## 2018-11-02 DIAGNOSIS — Z8249 Family history of ischemic heart disease and other diseases of the circulatory system: Secondary | ICD-10-CM | POA: Diagnosis not present

## 2018-11-02 DIAGNOSIS — E11622 Type 2 diabetes mellitus with other skin ulcer: Secondary | ICD-10-CM | POA: Diagnosis not present

## 2018-11-02 DIAGNOSIS — I509 Heart failure, unspecified: Secondary | ICD-10-CM | POA: Diagnosis not present

## 2018-11-02 DIAGNOSIS — I110 Hypertensive heart disease with heart failure: Secondary | ICD-10-CM | POA: Insufficient documentation

## 2018-11-02 DIAGNOSIS — L97822 Non-pressure chronic ulcer of other part of left lower leg with fat layer exposed: Secondary | ICD-10-CM | POA: Insufficient documentation

## 2018-11-02 DIAGNOSIS — M170 Bilateral primary osteoarthritis of knee: Secondary | ICD-10-CM | POA: Diagnosis not present

## 2018-11-02 DIAGNOSIS — S81801A Unspecified open wound, right lower leg, initial encounter: Secondary | ICD-10-CM | POA: Diagnosis not present

## 2018-11-02 DIAGNOSIS — S81802A Unspecified open wound, left lower leg, initial encounter: Secondary | ICD-10-CM | POA: Diagnosis not present

## 2018-11-02 DIAGNOSIS — I872 Venous insufficiency (chronic) (peripheral): Secondary | ICD-10-CM | POA: Insufficient documentation

## 2018-11-02 NOTE — Progress Notes (Signed)
[place] ([id number]) Visit Report for 11/02/2018 Arrival Information Details Patient Name: [name], PLA. Date of Service: 11/02/2018 12:45 PM Medical Record Number: [id number] Patient Account Number: [id number] Date of Birth/Sex: 19-May-1924 (85 y.o. F) Treating RN: Montey Hora Primary Care Early Ord: Myrtie Hawk Other Clinician: Referring Mclean Moya: Myrtie Hawk Treating Lisel Siegrist/Extender: Melburn Hake, HOYT Weeks in Treatment: 2 Visit Information History Since Last Visit Added or deleted any medications: No Patient Arrived: Wheel Chair Any new allergies or adverse reactions: No Arrival Time: 12:48 Had a fall or experienced change in No Accompanied By: caregiver activities of daily living that may affect Transfer Assistance: Manual risk of falls: Patient Identification Verified: Yes Signs or symptoms of abuse/neglect since last visito No Secondary Verification Process Yes Hospitalized since last visit: No Completed: Implantable device outside of the clinic excluding No Patient Has Alerts: Yes cellular tissue based products placed in the center Patient Alerts: ABI 04/30/18 L [place] R since last visit: [place] Has Dressing in Place as Prescribed: Yes TBI L .70 R .98 Has Compression in Place as Prescribed: Yes DMII Pain Present Now: No Electronic Signature(s) Signed: 11/02/2018 3:54:12 PM By: Lorine Bears RCP, RRT, CHT Entered By: Becky Sax, Amado Nash on 11/02/2018 12:54:27 [name], [name] ([id number]) -------------------------------------------------------------------------------- Clinic Level of Care Assessment Details Patient Name: [name], Cassandra M. Date of Service: 11/02/2018 12:45 PM Medical Record Number: [id number] Patient Account Number: [id number] Date of Birth/Sex: [birth year] (85 y.o. F) Treating RN: Montey Hora Primary Care Jermaine Tholl: Myrtie Hawk Other Clinician: Referring  Feliberto Stockley: Myrtie Hawk Treating Goerge Mohr/Extender: Melburn Hake, HOYT Weeks in Treatment: 2 Clinic Level of Care Assessment Items TOOL 4 Quantity Score []  - Use when only an EandM is performed on FOLLOW-UP visit 0 ASSESSMENTS - Nursing Assessment / Reassessment X - Reassessment of Co-morbidities (includes updates in patient status) 1 10 X- 1 5 Reassessment of Adherence to Treatment Plan ASSESSMENTS - Wound and Skin Assessment / Reassessment []  - Simple Wound Assessment / Reassessment - one wound 0 X- 2 5 Complex Wound Assessment / Reassessment - multiple wounds []  - 0 Dermatologic / Skin Assessment (not related to wound area) ASSESSMENTS - Focused Assessment X - Circumferential Edema Measurements - multi extremities 2 5 []  - 0 Nutritional Assessment / Counseling / Intervention X- 1 5 Lower Extremity Assessment (monofilament, tuning fork, pulses) []  - 0 Peripheral Arterial Disease Assessment (using hand held doppler) ASSESSMENTS - Ostomy and/or Continence Assessment and Care []  - Incontinence Assessment and Management 0 []  - 0 Ostomy Care Assessment and Management (repouching, etc.) PROCESS - Coordination of Care X - Simple Patient / Family Education for ongoing care 1 15 []  - 0 Complex (extensive) Patient / Family Education for ongoing care X- 1 10 Staff obtains Programmer, systems, Records, Test Results / Process Orders []  - 0 Staff telephones HHA, Nursing Homes / Clarify orders / etc []  - 0 Routine Transfer to another Facility (non-emergent condition) []  - 0 Routine Hospital Admission (non-emergent condition) []  - 0 New Admissions / Biomedical engineer / Ordering NPWT, Apligraf, etc. []  - 0 Emergency Hospital Admission (emergent condition) X- 1 10 Simple Discharge Coordination Cassandra ELPIDIA, KARN. ([id number]) []  - 0 Complex (extensive) Discharge Coordination PROCESS - Special Needs []  - Pediatric / Minor Patient Management 0 []  - 0 Isolation Patient  Management []  - 0 Hearing / Language / Visual special needs []  - 0 Assessment of Community assistance (transportation, D/C planning, etc.) []  - 0 Additional assistance / Altered mentation []  - 0 Support  Surface(s) Assessment (bed, cushion, seat, etc.) INTERVENTIONS - Wound Cleansing / Measurement []  - Simple Wound Cleansing - one wound 0 X- 2 5 Complex Wound Cleansing - multiple wounds X- 1 5 Wound Imaging (photographs - any number of wounds) []  - 0 Wound Tracing (instead of photographs) []  - 0 Simple Wound Measurement - one wound X- 2 5 Complex Wound Measurement - multiple wounds INTERVENTIONS - Wound Dressings []  - Small Wound Dressing one or multiple wounds 0 []  - 0 Medium Wound Dressing one or multiple wounds []  - 0 Large Wound Dressing one or multiple wounds []  - 0 Application of Medications - topical []  - 0 Application of Medications - injection INTERVENTIONS - Miscellaneous []  - External ear exam 0 []  - 0 Specimen Collection (cultures, biopsies, blood, body fluids, etc.) []  - 0 Specimen(s) / Culture(s) sent or taken to Lab for analysis []  - 0 Patient Transfer (multiple staff / Civil Service fast streamer / Similar devices) []  - 0 Simple Staple / Suture removal (25 or less) []  - 0 Complex Staple / Suture removal (26 or more) []  - 0 Hypo / Hyperglycemic Management (close monitor of Blood Glucose) []  - 0 Ankle / Brachial Index (ABI) - do not check if billed separately X- 1 5 Vital Signs [name], Cassandra M. ([id number]) Has the patient been seen at the hospital within the last three years: Yes Total Score: 105 Level Of Care: New/Established - Level 3 Electronic Signature(s) Signed: 11/02/2018 4:03:55 PM By: Montey Hora Entered By: Montey Hora on 11/02/2018 13:29:12 [name], [name] ([id number]) -------------------------------------------------------------------------------- Encounter Discharge Information Details Patient Name: [name], Cassandra  M. Date of Service: 11/02/2018 12:45 PM Medical Record Number: [id number] Patient Account Number: [id number] Date of Birth/Sex: May 25, 1924 (85 y.o. F) Treating RN: Montey Hora Primary Care Ege Muckey: Myrtie Hawk Other Clinician: Referring Shanikia Kernodle: Myrtie Hawk Treating Aqua Denslow/Extender: Melburn Hake, HOYT Weeks in Treatment: 2 Encounter Discharge Information Items Discharge Condition: Stable Ambulatory Status: Wheelchair Discharge Destination: Home Transportation: Private Auto Accompanied By: daughter Schedule Follow-up Appointment: No Clinical Summary of Care: Electronic Signature(s) Signed: 11/02/2018 1:29:55 PM By: Montey Hora Entered By: Montey Hora on 11/02/2018 13:29:55 [name], [name] ([id number]) -------------------------------------------------------------------------------- Lower Extremity Assessment Details Patient Name: [name], Cassandra M. Date of Service: 11/02/2018 12:45 PM Medical Record Number: [id number] Patient Account Number: [id number] Date of Birth/Sex: 08/20/1924 (85 y.o. F) Treating RN: Army Melia Primary Care Tiearra Colwell: Myrtie Hawk Other Clinician: Referring Freda Jaquith: Myrtie Hawk Treating Teonia Yager/Extender: Melburn Hake, HOYT Weeks in Treatment: 2 Edema Assessment Assessed: [Left: No] [Right: No] Edema: [Left: No] [Right: No] Calf Left: Right: Point of Measurement: 32 cm From Medial Instep 33 cm 34 cm Ankle Left: Right: Point of Measurement: 10 cm From Medial Instep 27 cm 27 cm Vascular Assessment Pulses: Dorsalis Pedis Palpable: [Left:Yes] [Right:Yes] Electronic Signature(s) Signed: 11/02/2018 2:53:02 PM By: Army Melia Entered By: Army Melia on 11/02/2018 13:03:38 [name], [name] ([id number]) -------------------------------------------------------------------------------- [place] Details Patient Name: [name], Cassandra M. Date of Service: 11/02/2018  12:45 PM Medical Record Number: [id number] Patient Account Number: [id number] Date of Birth/Sex: [birth year] (85 y.o. F) Treating RN: Montey Hora Primary Care Dhruva Orndoff: Myrtie Hawk Other Clinician: Referring Marce Schartz: Myrtie Hawk Treating Edessa Jakubowicz/Extender: Melburn Hake, HOYT Weeks in Treatment: 2 Active Inactive Electronic Signature(s) Signed: 11/02/2018 4:03:55 PM By: Montey Hora Entered By: Montey Hora on 11/02/2018 13:14:08 [place], [name] ([id number]) -------------------------------------------------------------------------------- Pain Assessment Details Patient Name: [name], Cassandra M. Date of Service: 11/02/2018 12:45 PM Medical Record Number: [id number] Patient  Account Number: [id number] Date of Birth/Sex: [birth year] (85 y.o. F) Treating RN: Montey Hora Primary Care Rian Busche: Myrtie Hawk Other Clinician: Referring Marquavion Venhuizen: Myrtie Hawk Treating Swannie Milius/Extender: Melburn Hake, HOYT Weeks in Treatment: 2 Active Problems Location of Pain Severity and Description of Pain Patient Has Paino No Site Locations Pain Management and Medication Current Pain Management: Electronic Signature(s) Signed: 11/02/2018 3:54:12 PM By: Lorine Bears RCP, RRT, CHT Signed: 11/02/2018 4:03:55 PM By: Montey Hora Entered By: Lorine Bears on 11/02/2018 12:54:37 [name], [name] ([id number]) -------------------------------------------------------------------------------- Wound Assessment Details Patient Name: [name], Cassandra M. Date of Service: 11/02/2018 12:45 PM Medical Record Number: [id number] Patient Account Number: [id number] Date of Birth/Sex: [birth year] (85 y.o. F) Treating RN: Montey Hora Primary Care Ciji Boston: Myrtie Hawk Other Clinician: Referring Ravenna Legore: Myrtie Hawk Treating Beanca Kiester/Extender: Melburn Hake, HOYT Weeks in Treatment: 2 Wound Status Wound  Number: 2 Primary Diabetic Wound/Ulcer of the Lower Extremity Etiology: Wound Location: Left, Medial, Posterior Lower Leg Wound Healed - Epithelialized Wounding Event: Blister Status: Date Acquired: 10/13/2018 Comorbid Cataracts, Glaucoma, Arrhythmia, Congestive Weeks Of Treatment: 2 History: Heart Failure, Coronary Artery Disease, Clustered Wound: No Hypertension, Type II Diabetes, Gout, Osteoarthritis Photos Wound Measurements Length: (cm) 0 % Re Width: (cm) 0 % Re Depth: (cm) 0 Epit Area: (cm) 0 Tun Volume: (cm) 0 Und duction in Area: 100% duction in Volume: 100% helialization: Large (67-100%) neling: No ermining: No Wound Description Classification: Grade 2 Exudate Amount: Medium Exudate Type: Serosanguineous Exudate Color: red, brown Foul Odor After Cleansing: No Slough/Fibrino Yes Wound Bed Granulation Amount: None Present (0%) Exposed Structure Necrotic Amount: Medium (34-66%) Fascia Exposed: No Necrotic Quality: Eschar Fat Layer (Subcutaneous Tissue) Exposed: Yes Tendon Exposed: No Muscle Exposed: No Joint Exposed: No Bone Exposed: No Electronic Signature(s) Cassandra JAHMIA, BERRETT ([id number]) Signed: 11/02/2018 4:03:55 PM By: Montey Hora Entered By: Montey Hora on 11/02/2018 13:13:45 [name], [name] ([id number]) -------------------------------------------------------------------------------- Wound Assessment Details Patient Name: [name], Remonia M. Date of Service: 11/02/2018 12:45 PM Medical Record Number: [id number] Patient Account Number: [id number] Date of Birth/Sex: May 06, 1924 (85 y.o. F) Treating RN: Montey Hora Primary Care Astin Sayre: Myrtie Hawk Other Clinician: Referring Medard Decuir: Myrtie Hawk Treating Sena Hoopingarner/Extender: Melburn Hake, HOYT Weeks in Treatment: 2 Wound Status Wound Number: 4 Primary Diabetic Wound/Ulcer of the Lower Extremity Etiology: Wound Location: Right, Medial Lower Leg Wound  Healed - Epithelialized Wounding Event: Blister Status: Date Acquired: 10/13/2018 Comorbid Cataracts, Glaucoma, Arrhythmia, Congestive Weeks Of Treatment: 2 History: Heart Failure, Coronary Artery Disease, Clustered Wound: No Hypertension, Type II Diabetes, Gout, Osteoarthritis Photos Wound Measurements Length: (cm) 0 % Reduction Width: (cm) 0 % Reduction Depth: (cm) 0 Epitheliali Area: (cm) 0 Tunneling: Volume: (cm) 0 Underminin in Area: 100% in Volume: 100% zation: Large (67-100%) No g: No Wound Description Classification: Grade 2 Foul Odor A Exudate Amount: Medium Slough/Fibr Exudate Type: Sanguinous Exudate Color: red fter Cleansing: No ino Yes Wound Bed Granulation Amount: None Present (0%) Exposed Structure Necrotic Amount: Small (1-33%) Fascia Exposed: No Necrotic Quality: Eschar Fat Layer (Subcutaneous Tissue) Exposed: No Tendon Exposed: No Muscle Exposed: No Joint Exposed: No Bone Exposed: No Electronic Signature(s) Cassandra MADDY, GRAHAM ([id number]) Signed: 11/02/2018 4:03:55 PM By: Montey Hora Entered By: Montey Hora on 11/02/2018 13:13:47 [place], [name] ([id number]) -------------------------------------------------------------------------------- Vitals Details Patient Name: [name], Vicki M. Date of Service: 11/02/2018 12:45 PM Medical Record Number: [id number] Patient Account Number: [id number] Date of Birth/Sex: 03-22-1924 (85 y.o. F) Treating RN: Montey Hora Primary Care Eyad Rochford: Myrtie Hawk Other Clinician:  Referring Fritz Cauthon: Myrtie Hawk Treating Iracema Lanagan/Extender: Melburn Hake, HOYT Weeks in Treatment: 2 Vital Signs Time Taken: 12:53 Temperature (F): 99.1 Height (in): 60 Pulse (bpm): 83 Weight (lbs): 190 Respiratory Rate (breaths/min): 16 Body Mass Index (BMI): 37.1 Blood Pressure (mmHg): 162/82 Reference Range: 80 - 120 mg / dl Electronic Signature(s) Signed: 11/02/2018 3:54:12 PM By:  Lorine Bears RCP, RRT, CHT Entered By: Lorine Bears on 11/02/2018 12:57:50

## 2018-11-02 NOTE — Progress Notes (Addendum)
[place] ([id number]) Visit Report for 11/02/2018 Chief Complaint Document Details Patient Name: [name], [name]. Date of Service: 11/02/2018 12:45 PM Medical Record Number: [id number] Patient Account Number: [id number] Date of Birth/Sex: 1924-09-08 (85 y.o. F) Treating RN: Montey Hora Primary Care Provider: Myrtie Hawk Other Clinician: Referring Provider: Myrtie Hawk Treating Provider/Extender: Melburn Hake, HOYT Weeks in Treatment: 2 Information Obtained from: Patient Chief Complaint Bilateral LE lymphedema Electronic Signature(s) Signed: 11/02/2018 1:25:22 PM By: Worthy Keeler PA-C Entered By: Worthy Keeler on 11/02/2018 13:25:21 [name], [name] ([id number]) -------------------------------------------------------------------------------- HPI Details Patient Name: [name], [name]. Date of Service: 11/02/2018 12:45 PM Medical Record Number: [id number] Patient Account Number: [id number] Date of Birth/Sex: 31-Jan-1925 (85 y.o. F) Treating RN: Montey Hora Primary Care Provider: Myrtie Hawk Other Clinician: Referring Provider: Myrtie Hawk Treating Provider/Extender: Melburn Hake, HOYT Weeks in Treatment: 2 History of Present Illness HPI Description: 04/17/18 on evaluation today patient presents for initial inspection in our clinic concerning issues that she has been having with her gluteal region. Things actually appear to be doing much better at this point although we could go her daughter tells me that the patient was actually having areas that were draining in the gluteal region bilaterally. Fortunately these regions appear to have resolved. Still there is evidence of pressure injury to the gluteal region she has blanchable erythema but decreased capillary refill to the area which again is an early sign of pressure. Again I do think that her daily activity may be the cause of such. She does have a  history of diabetes with a hemoglobin A1c of 7.8 on 04/11/18. She also has gout and hypertension. The patient does get around in her home according to what her daughter tells me. However she spends most of her day and actually her night as well in her recliner which I think is likely where the pressure is coming from. She tells me that she doesn't sleep in a bed and pretty much unless she's getting up to use the bathroom or eat she is in her recliner. This again I think is likely the biggest issue that we're seeing right now for what's causing the pressure to her gluteal region. 08/02/18 on evaluation today patient actually presents for reevaluation in clinic concerning issues that she's been having with her gluteal region which the good news is appears to be completely healed at this time. There's no open wound currently. She did have bilateral lower extremity lymphedema which was starting to week more on the right lower extremity especially. With that being said this seems to be also doing better in resolving she's not wearing the compression stockings of I think she really is that compression stockings pretty much all the time. She does have TBI's that were performed and finalized on 04/30/18 they appear to be doing well with a right TBI of 0.70 in the left TBI of 0.98. Overall I feel like the patient is actually doing quite well although we do need to have some things that we go through with them as far as teaching is concerned in order to prevent issues from showing up in the future. No fevers, chills, nausea, or vomiting noted at this time. 10/19/2018 upon evaluation today patient presents for reevaluation our clinic concerning issues that she is actually having with her bilateral lower extremities. I have seen her a couple times previous where we have discussed a wound on her gluteal region which actually was not giving her any trouble  technically the last time I saw her. She is always had issues  with her legs and swelling she does have lymphedema pumps. In fact by some crazy coincidence she actually ended up having them ordered from 2 different locations all likely from the congestive heart failure clinic as well as vascular and ended up with 2 sets of lymphedema pumps. Nonetheless 1 of them she got covered by insurance the other they had to pay $700 for. Obviously I am not exactly sure what is going on in this regard but she obviously does not need to have 2 sets of pumps. Fortunately there are no signs of infection in regard to patient's bilateral lower extremities. She has been tolerating the dressing changes without complication. No fevers, chills, nausea, vomiting, or diarrhea. She was wearing compression stockings for some time until her leg started to weep. She was also using the lymphedema pumps according to her daughter who was present during the visit today until she started having issues with the weeping as well and then they were sure that she continue to use them. 10/26/2018 on evaluation today patient appears to be doing well with regard to her lower extremity ulcers. In fact we have actually gotten everything down to just 2 open areas and on the left this is just one very small region in fact. Overall I feel like she has done excellent in the short amount of time that we have been taking care of her legs and overall and very pleased in this regard. No fevers, chills, nausea, vomiting, or diarrhea. 11/02/18 on evaluation today patient appears to be doing excellent with regard to her bilateral lower extremities. She has been tolerating the dressing changes without complication. Fortunately she appears to be completely healed upon evaluation today. Electronic Signature(s) Signed: 11/02/2018 1:25:49 PM By: Worthy Keeler PA-C Entered By: Worthy Keeler on 11/02/2018 13:25:49 [name], [name] ([id number]) [place], [name]  ([id number]) -------------------------------------------------------------------------------- Physical Exam Details Patient Name: [name], [name]. Date of Service: 11/02/2018 12:45 PM Medical Record Number: [id number] Patient Account Number: [id number] Date of Birth/Sex: 1924-10-19 (85 y.o. F) Treating RN: Montey Hora Primary Care Provider: Myrtie Hawk Other Clinician: Referring Provider: Myrtie Hawk Treating Provider/Extender: STONE III, HOYT Weeks in Treatment: 2 Constitutional Well-nourished and well-hydrated in no acute distress. Respiratory normal breathing without difficulty. Psychiatric this patient is able to make decisions and demonstrates good insight into disease process. Alert and Oriented x 3. pleasant and cooperative. Notes Patient's wound bed showed signs of complete epithelization of the bilateral lower extremity there is no sign of active infection or open wounds at this time all of which is excellent news. I am very pleased that she has healed and done so well so quickly. Electronic Signature(s) Signed: 11/02/2018 1:26:20 PM By: Worthy Keeler PA-C Entered By: Worthy Keeler on 11/02/2018 13:26:19 [place] ([id number]) -------------------------------------------------------------------------------- Physician Orders Details Patient Name: [name], Dinesha Green. Date of Service: 11/02/2018 12:45 PM Medical Record Number: [id number] Patient Account Number: [id number] Date of Birth/Sex: 02-28-1925 (85 y.o. 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 Hand wash wraps, hang to dry. Electronic Signature(s) Signed: 11/02/2018 4:03:55 PM By: Montey Hora Signed: 11/02/2018 6:57:28 PM By: Worthy Keeler PA-C Entered By: Montey Hora on 11/02/2018 13:18:36 [place], [name] ([id number]) -------------------------------------------------------------------------------- Problem List Details Patient Name: [name], Triniti Green. Date of Service: 11/02/2018 12:45 PM Medical Record Number: [id number] Patient Account Number: [id number] Date of Birth/Sex: 12/19/1924 (85 y.o. F) Treating RN: Montey Hora Primary Care Provider: Myrtie Hawk Other Clinician: Referring Provider: Myrtie Hawk Treating Provider/Extender: Melburn Hake, HOYT Weeks in Treatment: 2 Active Problems ICD-10 Evaluated Encounter Code Description Active Date Today Diagnosis E11.622 Type 2 diabetes mellitus with other skin ulcer 10/19/2018 No Yes I89.0 Lymphedema, not elsewhere classified 10/19/2018 No Yes I87.2 Venous insufficiency (chronic) (peripheral) 10/19/2018 No Yes L97.822 Non-pressure chronic ulcer of other part of left lower leg with 10/19/2018 No Yes fat layer exposed L97.812 Non-pressure chronic ulcer of other part of right lower leg 10/19/2018 No Yes with fat layer exposed I10 Essential (primary) hypertension 10/19/2018 No Yes J44.9 Chronic obstructive pulmonary disease, unspecified 10/19/2018 No Yes Inactive Problems Resolved Problems Electronic  Signature(s) Signed: 11/02/2018 1:09:03 PM By: Worthy Keeler PA-C Entered By: Worthy Keeler on 11/02/2018 13:09:03 [name], [name] ([id number]) -------------------------------------------------------------------------------- Progress Note Details Patient Name: [name], Giannina Green. Date of Service: 11/02/2018 12:45 PM Medical Record Number: [id number] Patient Account Number: [id number] Date of Birth/Sex: Nov 17, 1924 (85 y.o. F) Treating RN: Montey Hora Primary Care Provider: Myrtie Hawk Other Clinician: Referring Provider: Myrtie Hawk Treating Provider/Extender: Melburn Hake, HOYT Weeks in Treatment: 2 Subjective Chief Complaint Information obtained from Patient Bilateral LE lymphedema History of Present Illness (HPI) 04/17/18 on evaluation today patient presents for initial inspection in our clinic concerning issues that she has been having with her gluteal region. Things actually appear to be doing much better at this point although we could go her daughter tells me that the patient was actually having areas that were draining in the gluteal region bilaterally. Fortunately these regions appear to have resolved. Still there is evidence of pressure injury to the gluteal region she has blanchable erythema but decreased capillary refill to the area which again is an early sign of pressure. Again I do think that her daily activity may be the cause of such. She does have a history of diabetes with a hemoglobin A1c of 7.8 on 04/11/18. She also has gout and hypertension. The patient does get around in her home according to what her daughter tells me. However she spends most of her day and actually her night as well in her recliner which I think is likely where the pressure is coming from. She tells me that she doesn't sleep in a bed and pretty much unless she's getting up to use the bathroom or eat she is in her recliner. This again I think is likely the biggest  issue that we're seeing right now for what's causing the pressure to her gluteal region. 08/02/18 on evaluation today patient actually presents for reevaluation in clinic concerning issues that she's been having with her gluteal region which the good news is appears to be completely healed at this time. There's no open  wound currently. She did have bilateral lower extremity lymphedema which was starting to week more on the right lower extremity especially. With that being said this seems to be also doing better in resolving she's not wearing the compression stockings of I think she really is that compression stockings pretty much all the time. She does have TBI's that were performed and finalized on 04/30/18 they appear to be doing well with a right TBI of 0.70 in the left TBI of 0.98. Overall I feel like the patient is actually doing quite well although we do need to have some things that we go through with them as far as teaching is concerned in order to prevent issues from showing up in the future. No fevers, chills, nausea, or vomiting noted at this time. 10/19/2018 upon evaluation today patient presents for reevaluation our clinic concerning issues that she is actually having with her bilateral lower extremities. I have seen her a couple times previous where we have discussed a wound on her gluteal region which actually was not giving her any trouble technically the last time I saw her. She is always had issues with her legs and swelling she does have lymphedema pumps. In fact by some crazy coincidence she actually ended up having them ordered from 2 different locations all likely from the congestive heart failure clinic as well as vascular and ended up with 2 sets of lymphedema pumps. Nonetheless 1 of them she got covered by insurance the other they had to pay $700 for. Obviously I am not exactly sure what is going on in this regard but she obviously does not need to have 2 sets of pumps.  Fortunately there are no signs of infection in regard to patient's bilateral lower extremities. She has been tolerating the dressing changes without complication. No fevers, chills, nausea, vomiting, or diarrhea. She was wearing compression stockings for some time until her leg started to weep. She was also using the lymphedema pumps according to her daughter who was present during the visit today until she started having issues with the weeping as well and then they were sure that she continue to use them. 10/26/2018 on evaluation today patient appears to be doing well with regard to her lower extremity ulcers. In fact we have actually gotten everything down to just 2 open areas and on the left this is just one very small region in fact. Overall I feel like she has done excellent in the short amount of time that we have been taking care of her legs and overall and very pleased in this regard. No fevers, chills, nausea, vomiting, or diarrhea. 11/02/18 on evaluation today patient appears to be doing excellent with regard to her bilateral lower extremities. She has been tolerating the dressing changes without complication. Fortunately she appears to be completely healed upon evaluation today. [place] ([id number]) Patient History Information obtained from Patient. Family History Cancer - Child, Diabetes - Siblings, Heart Disease - Father,Child, Hypertension - Child, Thyroid Problems - Siblings, No family history of Kidney Disease, Lung Disease, Seizures, Tuberculosis. Social History Never smoker, Marital Status - Widowed, Alcohol Use - Never, Drug Use - No History, Caffeine Use - Moderate. Medical History Eyes Patient has history of Cataracts - removed, Glaucoma Denies history of Optic Neuritis Ear/Nose/Mouth/Throat Denies history of Chronic sinus problems/congestion, Middle ear problems Hematologic/Lymphatic Denies history of Anemia, Hemophilia, Human Immunodeficiency Virus,  Lymphedema, Sickle Cell Disease Respiratory Denies history of Aspiration, Asthma, Chronic Obstructive Pulmonary Disease (COPD), Pneumothorax, Sleep  Apnea, Tuberculosis Cardiovascular Patient has history of Arrhythmia, Congestive Heart Failure, Coronary Artery Disease, Hypertension Denies history of Angina, Hypotension, Myocardial Infarction, Peripheral Arterial Disease, Peripheral Venous Disease, Phlebitis, Vasculitis Gastrointestinal Denies history of Cirrhosis , Colitis, Crohn s, Hepatitis A, Hepatitis B, Hepatitis C Endocrine Patient has history of Type II Diabetes Denies history of Type I Diabetes Genitourinary Denies history of End Stage Renal Disease Immunological Denies history of Lupus Erythematosus, Raynaud s, Scleroderma Integumentary (Skin) Denies history of History of Burn, History of pressure wounds Musculoskeletal Patient has history of Gout, Osteoarthritis - bilateral knees Denies history of Rheumatoid Arthritis, Osteomyelitis Neurologic Denies history of Dementia, Neuropathy, Quadriplegia, Paraplegia, Seizure Disorder Oncologic Denies history of Received Chemotherapy, Received Radiation Psychiatric Denies history of Anorexia/bulimia, Confinement Anxiety Hospitalization/Surgery History - Heart valve replacement. Review of Systems (ROS) Constitutional Symptoms (General Health) Denies complaints or symptoms of Fatigue, Fever, Chills, Marked Weight Change. Respiratory Denies complaints or symptoms of Chronic or frequent coughs, Shortness of Breath. Cardiovascular Complains or has symptoms of LE edema. Denies complaints or symptoms of Chest pain. Psychiatric Denies complaints or symptoms of Anxiety, Claustrophobia. [place] ([id number]) Objective Constitutional Well-nourished and well-hydrated in no acute distress. Vitals Time Taken: 12:53 PM, Height: 60 in, Weight: 190 lbs, BMI: 37.1, Temperature: 99.1 F, Pulse: 83 bpm, Respiratory Rate: 16  breaths/min, Blood Pressure: 162/82 mmHg. Respiratory normal breathing without difficulty. Psychiatric this patient is able to make decisions and demonstrates good insight into disease process. Alert and Oriented x 3. pleasant and cooperative. General Notes: Patient's wound bed showed signs of complete epithelization of the bilateral lower extremity there is no sign of active infection or open wounds at this time all of which is excellent news. I am very pleased that she has healed and done so well so quickly. Integumentary (Hair, Skin) Wound #2 status is Healed - Epithelialized. Original cause of wound was Blister. The wound is located on the Left,Medial,Posterior Lower Leg. The wound measures 0cm length x 0cm width x 0cm depth; 0cm^2 area and 0cm^3 volume. There is Fat Layer (Subcutaneous Tissue) Exposed exposed. There is no tunneling or undermining noted. There is a medium amount of serosanguineous drainage noted. There is no granulation within the wound bed. There is a medium (34-66%) amount of necrotic tissue within the wound bed including Eschar. Wound #4 status is Healed - Epithelialized. Original cause of wound was Blister. The wound is located on the Right,Medial Lower Leg. The wound measures 0cm length x 0cm width x 0cm depth; 0cm^2 area and 0cm^3 volume. There is no tunneling or undermining noted. There is a medium amount of sanguinous drainage noted. There is no granulation within the wound bed. There is a small (1-33%) amount of necrotic tissue within the wound bed including Eschar. Assessment Active Problems ICD-10 Type 2 diabetes mellitus with other skin ulcer Lymphedema, not elsewhere classified Venous insufficiency (chronic) (peripheral) Non-pressure chronic ulcer of other part of left lower leg with fat layer exposed Non-pressure chronic ulcer of other part of right lower leg with fat layer exposed Essential (primary) hypertension Chronic obstructive pulmonary disease,  unspecified [name], [place] Jerilynn Mages ([id number]) Plan Discharge From [facility] Services: Discharge from [place] your velcro compression wraps daily from when you get out of bed in the morning until you go to bed at night, Lotion legs at bedtime. Hand wash wraps, hang to dry. 1. At this point I would recommend that we discontinue wound care services. The patient and her family member are in  agreement with the plan. 2. I am in a suggest as well that she wear her compression stockings/Velcro wraps that I got for her on a regular basis daily. She is to hand wash these only do not place them in the washer or dryer. 3. I recommended lotion to the legs at nighttime when she is sleeping so that will not damage her compression wraps. Patient will follow-up as needed. Electronic Signature(s) Signed: 11/02/2018 1:32:21 PM By: Worthy Keeler PA-C Previous Signature: 11/02/2018 1:26:55 PM Version By: Worthy Keeler PA-C Entered By: Worthy Keeler on 11/02/2018 13:32:20 [name], [name] ([id number]) -------------------------------------------------------------------------------- ROS/PFSH Details Patient Name: [name], Evia Green. Date of Service: 11/02/2018 12:45 PM Medical Record Number: [id number] Patient Account Number: [id number] Date of Birth/Sex: 01-01-1925 (85 y.o. F) Treating RN: Montey Hora Primary Care Provider: Myrtie Hawk Other Clinician: Referring Provider: Myrtie Hawk Treating Provider/Extender: Melburn Hake, HOYT Weeks in Treatment: 2 Information Obtained From Patient Constitutional Symptoms (General Health) Complaints and Symptoms: Negative for: Fatigue; Fever; Chills; Marked Weight Change Respiratory Complaints and Symptoms: Negative for: Chronic or frequent coughs; Shortness of Breath Medical History: Negative for: Aspiration; Asthma; Chronic Obstructive Pulmonary Disease (COPD); Pneumothorax; Sleep  Apnea; Tuberculosis Cardiovascular Complaints and Symptoms: Positive for: LE edema Negative for: Chest pain Medical History: Positive for: Arrhythmia; Congestive Heart Failure; Coronary Artery Disease; Hypertension Negative for: Angina; Hypotension; Myocardial Infarction; Peripheral Arterial Disease; Peripheral Venous Disease; Phlebitis; Vasculitis Psychiatric Complaints and Symptoms: Negative for: Anxiety; Claustrophobia Medical History: Negative for: Anorexia/bulimia; Confinement Anxiety Eyes Medical History: Positive for: Cataracts - removed; Glaucoma Negative for: Optic Neuritis Ear/Nose/Mouth/Throat Medical History: Negative for: Chronic sinus problems/congestion; Middle ear problems Hematologic/Lymphatic SHOFFNER LATOIA, EYSTER. ([id number]) Medical History: Negative for: Anemia; Hemophilia; Human Immunodeficiency Virus; Lymphedema; Sickle Cell Disease Gastrointestinal Medical History: Negative for: Cirrhosis ; Colitis; Crohnos; Hepatitis A; Hepatitis B; Hepatitis C Endocrine Medical History: Positive for: Type II Diabetes Negative for: Type I Diabetes Time with diabetes: 20 years Treated with: Oral agents Blood sugar tested every day: Yes Tested : twice daily Genitourinary Medical History: Negative for: End Stage Renal Disease Immunological Medical History: Negative for: Lupus Erythematosus; Raynaudos; Scleroderma Integumentary (Skin) Medical History: Negative for: History of Burn; History of pressure wounds Musculoskeletal Medical History: Positive for: Gout; Osteoarthritis - bilateral knees Negative for: Rheumatoid Arthritis; Osteomyelitis Neurologic Medical History: Negative for: Dementia; Neuropathy; Quadriplegia; Paraplegia; Seizure Disorder Oncologic Medical History: Negative for: Received Chemotherapy; Received Radiation HBO Extended History Items Eyes: Eyes: Cataracts Glaucoma Immunizations Pneumococcal Vaccine: Received Pneumococcal  Vaccination: Yes Implantable Devices [place] ([id number]) None Hospitalization / Surgery History Type of Hospitalization/Surgery Heart valve replacement Family and Social History Cancer: Yes - Child; Diabetes: Yes - Siblings; Heart Disease: Yes - Father,Child; Hypertension: Yes - Child; Kidney Disease: No; Lung Disease: No; Seizures: No; Thyroid Problems: Yes - Siblings; Tuberculosis: No; Never smoker; Marital Status - Widowed; Alcohol Use: Never; Drug Use: No History; Caffeine Use: Moderate Physician Affirmation I have reviewed and agree with the above information. Electronic Signature(s) Signed: 11/02/2018 4:03:55 PM By: Montey Hora Signed: 11/02/2018 6:57:28 PM By: Worthy Keeler PA-C Entered By: Worthy Keeler on 11/02/2018 13:26:04 [place], [name] ([id number]) -------------------------------------------------------------------------------- SuperBill Details Patient Name: [name], Alz[place] Green. Date of Service: 11/02/2018 Medical Record Number: [id number] Patient Account Number: [id number] Date of Birth/Sex: 11/28/1924 (85 y.o. F) Treating RN: Montey Hora Primary Care Provider: Myrtie Hawk Other Clinician: Referring Provider: Myrtie Hawk Treating Provider/Extender: STONE III, HOYT Weeks in Treatment: 2 Diagnosis Coding ICD-10  Codes Code Description E11.622 Type 2 diabetes mellitus with other skin ulcer I89.0 Lymphedema, not elsewhere classified I87.2 Venous insufficiency (chronic) (peripheral) L97.822 Non-pressure chronic ulcer of other part of left lower leg with fat layer exposed L97.812 Non-pressure chronic ulcer of other part of right lower leg with fat layer exposed I10 Essential (primary) hypertension J44.9 Chronic obstructive pulmonary disease, unspecified Facility Procedures CPT4 Code: [id number] Description: 80034 - WOUND CARE VISIT-LEV 3 EST PT Modifier: Quantity: 1 Physician Procedures CPT4 Code  Description: [id number] - WC PHYS LEVEL 3 - EST PT ICD-10 Diagnosis Description E11.622 Type 2 diabetes mellitus with other skin ulcer I89.0 Lymphedema, not elsewhere classified I87.2 Venous insufficiency (chronic) (peripheral) L97.822  Non-pressure chronic ulcer of other part of left lower leg wit Modifier: h fat layer expos Quantity: 1 ed Electronic Signature(s) Signed: 11/02/2018 1:29:24 PM By: Montey Hora Signed: 11/02/2018 6:57:28 PM By: Worthy Keeler PA-C Previous Signature: 11/02/2018 1:27:15 PM Version By: Worthy Keeler PA-C Entered By: Montey Hora on 11/02/2018 13:29:24

## 2018-11-07 NOTE — Telephone Encounter (Signed)
Daughter has been informed.

## 2018-11-16 ENCOUNTER — Other Ambulatory Visit: Payer: Self-pay | Admitting: Internal Medicine

## 2018-11-16 DIAGNOSIS — R600 Localized edema: Secondary | ICD-10-CM

## 2018-11-16 DIAGNOSIS — T148XXA Other injury of unspecified body region, initial encounter: Secondary | ICD-10-CM

## 2018-11-16 MED ORDER — MUPIROCIN 2 % EX OINT: TOPICAL_OINTMENT | Freq: Two times a day (BID) | CUTANEOUS | 2 refills | Status: DC

## 2018-11-22 ENCOUNTER — Encounter: Payer: PPO | Admitting: Physician Assistant

## 2018-11-22 ENCOUNTER — Other Ambulatory Visit: Payer: Self-pay

## 2018-11-22 DIAGNOSIS — L97822 Non-pressure chronic ulcer of other part of left lower leg with fat layer exposed: Secondary | ICD-10-CM | POA: Diagnosis not present

## 2018-11-22 DIAGNOSIS — E11622 Type 2 diabetes mellitus with other skin ulcer: Secondary | ICD-10-CM | POA: Diagnosis not present

## 2018-11-22 DIAGNOSIS — I890 Lymphedema, not elsewhere classified: Secondary | ICD-10-CM | POA: Diagnosis not present

## 2018-11-22 NOTE — Progress Notes (Addendum)
[place] ([id number]) Visit Report for 11/22/2018 Chief Complaint Document Details Patient Name: [name], [name]. Date of Service: 11/22/2018 2:45 PM Medical Record Number: [id number] Patient Account Number: [id number] Date of Birth/Sex: 05/30/1924 (85 y.o. F) Treating RN: Army Melia Primary Care Provider: Myrtie Hawk Other Clinician: Referring Provider: Myrtie Hawk Treating Provider/Extender: Melburn Hake, HOYT Weeks in Treatment: 4 Information Obtained from: Patient Chief Complaint Bilateral LE lymphedema with left leg ulcer Electronic Signature(s) Signed: 11/22/2018 3:15:26 PM By: Worthy Keeler PA-C Previous Signature: 11/22/2018 2:39:36 PM Version By: Worthy Keeler PA-C Entered By: Worthy Keeler on 11/22/2018 15:15:26 [place] ([id number]) -------------------------------------------------------------------------------- HPI Details Patient Name: [name], Cassandra M. Date of Service: 11/22/2018 2:45 PM Medical Record Number: [id number] Patient Account Number: [id number] Date of Birth/Sex: 1925/01/08 (85 y.o. F) Treating RN: Army Melia Primary Care Provider: Myrtie Hawk Other Clinician: Referring Provider: Myrtie Hawk Treating Provider/Extender: Melburn Hake, HOYT Weeks in Treatment: 4 History of Present Illness HPI Description: 04/17/18 on evaluation today patient presents for initial inspection in our clinic concerning issues that she has been having with her gluteal region. Things actually appear to be doing much better at this point although we could go her daughter tells me that the patient was actually having areas that were draining in the gluteal region bilaterally. Fortunately these regions appear to have resolved. Still there is evidence of pressure injury to the gluteal region she has blanchable erythema but decreased capillary refill to the area which again is an early sign of  pressure. Again I do think that her daily activity may be the cause of such. She does have a history of diabetes with a hemoglobin A1c of 7.8 on 04/11/18. She also has gout and hypertension. The patient does get around in her home according to what her daughter tells me. However she spends most of her day and actually her night as well in her recliner which I think is likely where the pressure is coming from. She tells me that she doesn't sleep in a bed and pretty much unless she's getting up to use the bathroom or eat she is in her recliner. This again I think is likely the biggest issue that we're seeing right now for what's causing the pressure to her gluteal region. 08/02/18 on evaluation today patient actually presents for reevaluation in clinic concerning issues that she's been having with her gluteal region which the good news is appears to be completely healed at this time. There's no open wound currently. She did have bilateral lower extremity lymphedema which was starting to week more on the right lower extremity especially. With that being said this seems to be also doing better in resolving she's not wearing the compression stockings of I think she really is that compression stockings pretty much all the time. She does have TBI's that were performed and finalized on 04/30/18 they appear to be doing well with a right TBI of 0.70 in the left TBI of 0.98. Overall I feel like the patient is actually doing quite well although we do need to have some things that we go through with them as far as teaching is concerned in order to prevent issues from showing up in the future. No fevers, chills, nausea, or vomiting noted at this time. 10/19/2018 upon evaluation today patient presents for reevaluation our clinic concerning issues that she is actually having with her bilateral lower extremities. I have seen her a couple times previous where we have  discussed a wound on her gluteal region which actually  was not giving her any trouble technically the last time I saw her. She is always had issues with her legs and swelling she does have lymphedema pumps. In fact by some crazy coincidence she actually ended up having them ordered from 2 different locations all likely from the congestive heart failure clinic as well as vascular and ended up with 2 sets of lymphedema pumps. Nonetheless 1 of them she got covered by insurance the other they had to pay $700 for. Obviously I am not exactly sure what is going on in this regard but she obviously does not need to have 2 sets of pumps. Fortunately there are no signs of infection in regard to patient's bilateral lower extremities. She has been tolerating the dressing changes without complication. No fevers, chills, nausea, vomiting, or diarrhea. She was wearing compression stockings for some time until her leg started to weep. She was also using the lymphedema pumps according to her daughter who was present during the visit today until she started having issues with the weeping as well and then they were sure that she continue to use them. 10/26/2018 on evaluation today patient appears to be doing well with regard to her lower extremity ulcers. In fact we have actually gotten everything down to just 2 open areas and on the left this is just one very small region in fact. Overall I feel like she has done excellent in the short amount of time that we have been taking care of her legs and overall and very pleased in this regard. No fevers, chills, nausea, vomiting, or diarrhea. 11/02/18 on evaluation today patient appears to be doing excellent with regard to her bilateral lower extremities. She has been tolerating the dressing changes without complication. Fortunately she appears to be completely healed upon evaluation today. 11/22/2018 on evaluation today patient appears to be doing unfortunately somewhat worse compared to when I last saw her. She has a new blister  that is open since I last evaluated her unfortunately. The last time I did see her was actually on 9//20. Upon evaluation today she appears to have no signs of systemic infection which is good news there is also no signs of local infection I feel like this was simply more of a blister that came open. Her caregiver who is with her today states that when she [place] ([id number]) is with her she has her use her lymphedema pumps twice a day. However she cannot speak to when others are caring for her. She also states that she wears the Velcro compression wraps on a regular basis. Electronic Signature(s) Signed: 11/22/2018 3:15:34 PM By: Worthy Keeler PA-C Entered By: Worthy Keeler on 11/22/2018 15:15:34 [place], [name] ([id number]) -------------------------------------------------------------------------------- Physical Exam Details Patient Name: [name], Keeley M. Date of Service: 11/22/2018 2:45 PM Medical Record Number: [id number] Patient Account Number: [id number] Date of Birth/Sex: [birth year] (85 y.o. F) Treating RN: Army Melia Primary Care Provider: Myrtie Hawk Other Clinician: Referring Provider: Myrtie Hawk Treating Provider/Extender: STONE III, HOYT Weeks in Treatment: 4 Constitutional Well-nourished and well-hydrated in no acute distress. Respiratory normal breathing without difficulty. clear to auscultation bilaterally. Cardiovascular regular rate and rhythm with normal S1, S2. Psychiatric this patient is able to make decisions and demonstrates good insight into disease process. Alert and Oriented x 3. pleasant and cooperative. Notes Patient's wound currently actually peers to be very superficial there is no signs of active infection and  fortunately she seems to be doing well as far as pain is concerned there is no significant pain. With that being said unfortunately she is having issues with edema and again I think  this is causing blistering which leads to the wounds such as were seeing today. No sharp debridement was necessary at this point. Electronic Signature(s) Signed: 11/22/2018 3:16:11 PM By: Worthy Keeler PA-C Entered By: Worthy Keeler on 11/22/2018 15:16:11 [place] ([id number]) -------------------------------------------------------------------------------- Physician Orders Details Patient Name: [name], Tashonda M. Date of Service: 11/22/2018 2:45 PM Medical Record Number: [id number] Patient Account Number: [id number] Date of Birth/Sex: 1924-09-30 (85 y.o. F) Treating RN: Army Melia Primary Care Provider: Myrtie Hawk Other Clinician: Referring Provider: Myrtie Hawk Treating Provider/Extender: Melburn Hake, HOYT Weeks in Treatment: 4 Verbal / Phone Orders: No Diagnosis Coding ICD-10 Coding Code Description E11.622 Type 2 diabetes mellitus with other skin ulcer I89.0 Lymphedema, not elsewhere classified I87.2 Venous insufficiency (chronic) (peripheral) L97.822 Non-pressure chronic ulcer of other part of left lower leg with fat layer exposed L97.812 Non-pressure chronic ulcer of other part of right lower leg with fat layer exposed I10 Essential (primary) hypertension J44.9 Chronic obstructive pulmonary disease, unspecified Wound Cleansing Wound #6 Left,Medial Lower Leg o Clean wound with Normal Saline. Primary Wound Dressing Wound #6 Left,Medial Lower Leg o Silver Alginate Secondary Dressing Wound #6 Left,Medial Lower Leg o ABD pad Dressing Change Frequency Wound #6 Left,Medial Lower Leg o Change dressing every week Follow-up Appointments Wound #6 Left,Medial Lower Leg o Return Appointment in 1 week. Edema Control Wound #6 Left,Medial Lower Leg o 3 Layer Compression System - Left Lower Extremity Electronic Signature(s) Signed: 11/22/2018 4:47:50 PM By: Worthy Keeler PA-C Signed: 11/23/2018 4:09:41 PM By: Army Melia Entered By: Army Melia on 11/22/2018 15:12:26 [name], [name] ([id number]) [place], [name] ([id number]) -------------------------------------------------------------------------------- Problem List Details Patient Name: [name], Cassandra M. Date of Service: 11/22/2018 2:45 PM Medical Record Number: [id number] Patient Account Number: [id number] Date of Birth/Sex: [birth year] (85 y.o. F) Treating RN: Army Melia Primary Care Provider: Myrtie Hawk Other Clinician: Referring Provider: Myrtie Hawk Treating Provider/Extender: Melburn Hake, HOYT Weeks in Treatment: 4 Active Problems ICD-10 Evaluated Encounter Code Description Active Date Today Diagnosis E11.622 Type 2 diabetes mellitus with other skin ulcer 10/19/2018 No Yes I89.0 Lymphedema, not elsewhere classified 10/19/2018 No Yes I87.2 Venous insufficiency (chronic) (peripheral) 10/19/2018 No Yes L97.822 Non-pressure chronic ulcer of other part of left lower leg with 10/19/2018 No Yes fat layer exposed I10 Essential (primary) hypertension 10/19/2018 No Yes J44.9 Chronic obstructive pulmonary disease, unspecified 10/19/2018 No Yes Inactive Problems Resolved Problems ICD-10 Code Description Active Date Resolved Date L97.812 Non-pressure chronic ulcer of other part of right lower leg with fat 10/19/2018 10/19/2018 layer exposed Electronic Signature(s) Signed: 11/22/2018 3:15:05 PM By: Worthy Keeler PA-C Previous Signature: 11/22/2018 2:39:31 PM Version By: Worthy Keeler PA-C Entered By: Worthy Keeler on 11/22/2018 15:15:04 [place], [name] ([id number]) [place] ([id number]) -------------------------------------------------------------------------------- Progress Note Details Patient Name: [name], Cassandra M. Date of Service: 11/22/2018 2:45 PM Medical Record Number: [id number] Patient Account Number: [id number] Date of Birth/Sex: 26-Feb-1925 (85 y.o.  F) Treating RN: Army Melia Primary Care Provider: Myrtie Hawk Other Clinician: Referring Provider: Myrtie Hawk Treating Provider/Extender: Melburn Hake, HOYT Weeks in Treatment: 4 Subjective Chief Complaint Information obtained from Patient Bilateral LE lymphedema with left leg ulcer History of Present Illness (HPI) 04/17/18 on evaluation today patient presents for initial inspection in our clinic  concerning issues that she has been having with her gluteal region. Things actually appear to be doing much better at this point although we could go her daughter tells me that the patient was actually having areas that were draining in the gluteal region bilaterally. Fortunately these regions appear to have resolved. Still there is evidence of pressure injury to the gluteal region she has blanchable erythema but decreased capillary refill to the area which again is an early sign of pressure. Again I do think that her daily activity may be the cause of such. She does have a history of diabetes with a hemoglobin A1c of 7.8 on 04/11/18. She also has gout and hypertension. The patient does get around in her home according to what her daughter tells me. However she spends most of her day and actually her night as well in her recliner which I think is likely where the pressure is coming from. She tells me that she doesn't sleep in a bed and pretty much unless she's getting up to use the bathroom or eat she is in her recliner. This again I think is likely the biggest issue that we're seeing right now for what's causing the pressure to her gluteal region. 08/02/18 on evaluation today patient actually presents for reevaluation in clinic concerning issues that she's been having with her gluteal region which the good news is appears to be completely healed at this time. There's no open wound currently. She did have bilateral lower extremity lymphedema which was starting to week more on the  right lower extremity especially. With that being said this seems to be also doing better in resolving she's not wearing the compression stockings of I think she really is that compression stockings pretty much all the time. She does have TBI's that were performed and finalized on 04/30/18 they appear to be doing well with a right TBI of 0.70 in the left TBI of 0.98. Overall I feel like the patient is actually doing quite well although we do need to have some things that we go through with them as far as teaching is concerned in order to prevent issues from showing up in the future. No fevers, chills, nausea, or vomiting noted at this time. 10/19/2018 upon evaluation today patient presents for reevaluation our clinic concerning issues that she is actually having with her bilateral lower extremities. I have seen her a couple times previous where we have discussed a wound on her gluteal region which actually was not giving her any trouble technically the last time I saw her. She is always had issues with her legs and swelling she does have lymphedema pumps. In fact by some crazy coincidence she actually ended up having them ordered from 2 different locations all likely from the congestive heart failure clinic as well as vascular and ended up with 2 sets of lymphedema pumps. Nonetheless 1 of them she got covered by insurance the other they had to pay $700 for. Obviously I am not exactly sure what is going on in this regard but she obviously does not need to have 2 sets of pumps. Fortunately there are no signs of infection in regard to patient's bilateral lower extremities. She has been tolerating the dressing changes without complication. No fevers, chills, nausea, vomiting, or diarrhea. She was wearing compression stockings for some time until her leg started to weep. She was also using the lymphedema pumps according to her daughter who was present during the visit today until she started having issues  with the weeping as well and then they were sure that she continue to use them. 10/26/2018 on evaluation today patient appears to be doing well with regard to her lower extremity ulcers. In fact we have actually gotten everything down to just 2 open areas and on the left this is just one very small region in fact. Overall I feel like she has done excellent in the short amount of time that we have been taking care of her legs and overall and very pleased in this regard. No fevers, chills, nausea, vomiting, or diarrhea. 11/02/18 on evaluation today patient appears to be doing excellent with regard to her bilateral lower extremities. She has been tolerating the dressing changes without complication. Fortunately she appears to be completely healed upon evaluation today. [place] ([id number]) 11/22/2018 on evaluation today patient appears to be doing unfortunately somewhat worse compared to when I last saw her. She has a new blister that is open since I last evaluated her unfortunately. The last time I did see her was actually on 9//20. Upon evaluation today she appears to have no signs of systemic infection which is good news there is also no signs of local infection I feel like this was simply more of a blister that came open. Her caregiver who is with her today states that when she is with her she has her use her lymphedema pumps twice a day. However she cannot speak to when others are caring for her. She also states that she wears the Velcro compression wraps on a regular basis. Patient History Information obtained from Patient. Family History Cancer - Child, Diabetes - Siblings, Heart Disease - Father,Child, Hypertension - Child, Thyroid Problems - Siblings, No family history of Kidney Disease, Lung Disease, Seizures, Tuberculosis. Social History Never smoker, Marital Status - Widowed, Alcohol Use - Never, Drug Use - No History, Caffeine Use - Moderate. Medical  History Eyes Patient has history of Cataracts - removed, Glaucoma Denies history of Optic Neuritis Ear/Nose/Mouth/Throat Denies history of Chronic sinus problems/congestion, Middle ear problems Hematologic/Lymphatic Denies history of Anemia, Hemophilia, Human Immunodeficiency Virus, Lymphedema, Sickle Cell Disease Respiratory Denies history of Aspiration, Asthma, Chronic Obstructive Pulmonary Disease (COPD), Pneumothorax, Sleep Apnea, Tuberculosis Cardiovascular Patient has history of Arrhythmia, Congestive Heart Failure, Coronary Artery Disease, Hypertension Denies history of Angina, Hypotension, Myocardial Infarction, Peripheral Arterial Disease, Peripheral Venous Disease, Phlebitis, Vasculitis Gastrointestinal Denies history of Cirrhosis , Colitis, Crohn s, Hepatitis A, Hepatitis B, Hepatitis C Endocrine Patient has history of Type II Diabetes Denies history of Type I Diabetes Genitourinary Denies history of End Stage Renal Disease Immunological Denies history of Lupus Erythematosus, Raynaud s, Scleroderma Integumentary (Skin) Denies history of History of Burn, History of pressure wounds Musculoskeletal Patient has history of Gout, Osteoarthritis - bilateral knees Denies history of Rheumatoid Arthritis, Osteomyelitis Neurologic Denies history of Dementia, Neuropathy, Quadriplegia, Paraplegia, Seizure Disorder Oncologic Denies history of Received Chemotherapy, Received Radiation Psychiatric Denies history of Anorexia/bulimia, Confinement Anxiety Hospitalization/Surgery History - Heart valve replacement. Review of Systems (ROS) Constitutional Symptoms (General Health) Denies complaints or symptoms of Fatigue, Fever, Chills, Marked Weight Change. Respiratory [name], [place]. ([id number]) Denies complaints or symptoms of Chronic or frequent coughs, Shortness of Breath. Cardiovascular Denies complaints or symptoms of Chest pain, LE edema. Psychiatric Denies  complaints or symptoms of Anxiety, Claustrophobia. Objective Constitutional Well-nourished and well-hydrated in no acute distress. Vitals Time Taken: 2:50 PM, Height: 60 in, Source: Stated, Weight: 190 lbs, Source: Stated, BMI: 37.1, Temperature: 99.1 F, Pulse: 75 bpm,  Respiratory Rate: 16 breaths/min, Blood Pressure: 154/68 mmHg. Respiratory normal breathing without difficulty. clear to auscultation bilaterally. Cardiovascular regular rate and rhythm with normal S1, S2. Psychiatric this patient is able to make decisions and demonstrates good insight into disease process. Alert and Oriented x 3. pleasant and cooperative. General Notes: Patient's wound currently actually peers to be very superficial there is no signs of active infection and fortunately she seems to be doing well as far as pain is concerned there is no significant pain. With that being said unfortunately she is having issues with edema and again I think this is causing blistering which leads to the wounds such as were seeing today. No sharp debridement was necessary at this point. Integumentary (Hair, Skin) Wound #6 status is Open. Original cause of wound was Gradually Appeared. The wound is located on the Left,Medial Lower Leg. The wound measures 1.8cm length x 1.5cm width x 0.1cm depth; 2.121cm^2 area and 0.212cm^3 volume. There is Fat Layer (Subcutaneous Tissue) Exposed exposed. There is no tunneling or undermining noted. There is a large amount of serous drainage noted. The wound margin is flat and intact. There is small (1-33%) pale granulation within the wound bed. There is a large (67-100%) amount of necrotic tissue within the wound bed including Adherent Slough. Assessment Active Problems ICD-10 Type 2 diabetes mellitus with other skin ulcer Lymphedema, not elsewhere classified Venous insufficiency (chronic) (peripheral) Non-pressure chronic ulcer of other part of left lower leg with fat layer exposed [name], Cassandra M. ([id number]) Essential (primary) hypertension Chronic obstructive pulmonary disease, unspecified Procedures Wound #6 Pre-procedure diagnosis of Wound #6 is a Diabetic Wound/Ulcer of the Lower Extremity located on the Left,Medial Lower Leg . There was a Three Layer Compression Therapy Procedure with a pre-treatment ABI of 0.7 by Army Melia, RN. Post procedure Diagnosis Wound #6: Same as Pre-Procedure Plan Wound Cleansing: Wound #6 Left,Medial Lower Leg: Clean wound with Normal Saline. Primary Wound Dressing: Wound #6 Left,Medial Lower Leg: Silver Alginate Secondary Dressing: Wound #6 Left,Medial Lower Leg: ABD pad Dressing Change Frequency: Wound #6 Left,Medial Lower Leg: Change dressing every week Follow-up Appointments: Wound #6 Left,Medial Lower Leg: Return Appointment in 1 week. Edema Control: Wound #6 Left,Medial Lower Leg: 3 Layer Compression System - Left Lower Extremity 1. I would recommend that we go ahead and initiate treatment with the 3 layer compression wrap which she tolerated previously with good result. 2. I would recommend that we utilize a silver alginate dressing for the wound bed to help dry this up and allow it to heal more effectively. 3. Also recommend patient elevate her legs as much as possible to try to help with the edema and again once were done with the compression wrap to go back to her Velcro compression wraps hopefully that will be sufficient to keep this closed. We will see patient back for reevaluation in 1 week here in the clinic. If anything worsens or changes patient will contact our office for additional recommendations. Electronic Signature(s) [name], [name] ([id number]) Signed: 11/22/2018 3:16:47 PM By: Worthy Keeler PA-C Entered By: Worthy Keeler on 11/22/2018 15:16:47 [place] ([id number]) -------------------------------------------------------------------------------- ROS/PFSH  Details Patient Name: [name], Cassandra M. Date of Service: 11/22/2018 2:45 PM Medical Record Number: [id number] Patient Account Number: [id number] Date of Birth/Sex: 07-09-1924 (85 y.o. F) Treating RN: Army Melia Primary Care Provider: Myrtie Hawk Other Clinician: Referring Provider: Myrtie Hawk Treating Provider/Extender: Melburn Hake, HOYT Weeks in Treatment: 4 Information Obtained From Patient Constitutional Symptoms (  General Health) Complaints and Symptoms: Negative for: Fatigue; Fever; Chills; Marked Weight Change Respiratory Complaints and Symptoms: Negative for: Chronic or frequent coughs; Shortness of Breath Medical History: Negative for: Aspiration; Asthma; Chronic Obstructive Pulmonary Disease (COPD); Pneumothorax; Sleep Apnea; Tuberculosis Cardiovascular Complaints and Symptoms: Negative for: Chest pain; LE edema Medical History: Positive for: Arrhythmia; Congestive Heart Failure; Coronary Artery Disease; Hypertension Negative for: Angina; Hypotension; Myocardial Infarction; Peripheral Arterial Disease; Peripheral Venous Disease; Phlebitis; Vasculitis Psychiatric Complaints and Symptoms: Negative for: Anxiety; Claustrophobia Medical History: Negative for: Anorexia/bulimia; Confinement Anxiety Eyes Medical History: Positive for: Cataracts - removed; Glaucoma Negative for: Optic Neuritis Ear/Nose/Mouth/Throat Medical History: Negative for: Chronic sinus problems/congestion; Middle ear problems Hematologic/Lymphatic Medical History: Negative for: Anemia; Hemophilia; Human Immunodeficiency Virus; Lymphedema; Sickle Cell Disease [name], [name] ([id number]) Gastrointestinal Medical History: Negative for: Cirrhosis ; Colitis; Crohnos; Hepatitis A; Hepatitis B; Hepatitis C Endocrine Medical History: Positive for: Type II Diabetes Negative for: Type I Diabetes Time with diabetes: 20 years Treated with: Oral agents Blood sugar  tested every day: Yes Tested : twice daily Genitourinary Medical History: Negative for: End Stage Renal Disease Immunological Medical History: Negative for: Lupus Erythematosus; Raynaudos; Scleroderma Integumentary (Skin) Medical History: Negative for: History of Burn; History of pressure wounds Musculoskeletal Medical History: Positive for: Gout; Osteoarthritis - bilateral knees Negative for: Rheumatoid Arthritis; Osteomyelitis Neurologic Medical History: Negative for: Dementia; Neuropathy; Quadriplegia; Paraplegia; Seizure Disorder Oncologic Medical History: Negative for: Received Chemotherapy; Received Radiation HBO Extended History Items Eyes: Eyes: Cataracts Glaucoma Immunizations Pneumococcal Vaccine: Received Pneumococcal Vaccination: Yes Implantable Devices None Hospitalization / Surgery History Type of Hospitalization/Surgery [name], BREON. ([id number]) Heart valve replacement Family and Social History Cancer: Yes - Child; Diabetes: Yes - Siblings; Heart Disease: Yes - Father,Child; Hypertension: Yes - Child; Kidney Disease: No; Lung Disease: No; Seizures: No; Thyroid Problems: Yes - Siblings; Tuberculosis: No; Never smoker; Marital Status - Widowed; Alcohol Use: Never; Drug Use: No History; Caffeine Use: Moderate Physician Affirmation I have reviewed and agree with the above information. Electronic Signature(s) Signed: 11/22/2018 4:47:50 PM By: Worthy Keeler PA-C Signed: 11/23/2018 4:09:41 PM By: Army Melia Entered By: Worthy Keeler on 11/22/2018 15:15:54 [place], [name] ([id number]) -------------------------------------------------------------------------------- SuperBill Details Patient Name: [name], Neveyah M. Date of Service: 11/22/2018 Medical Record Number: [id number] Patient Account Number: [id number] Date of Birth/Sex: [birth year] (85 y.o. F) Treating RN: Army Melia Primary Care Provider: Myrtie Hawk Other  Clinician: Referring Provider: Myrtie Hawk Treating Provider/Extender: Melburn Hake, HOYT Weeks in Treatment: 4 Diagnosis Coding ICD-10 Codes Code Description E11.622 Type 2 diabetes mellitus with other skin ulcer I89.0 Lymphedema, not elsewhere classified I87.2 Venous insufficiency (chronic) (peripheral) L97.822 Non-pressure chronic ulcer of other part of left lower leg with fat layer exposed L97.812 Non-pressure chronic ulcer of other part of right lower leg with fat layer exposed I10 Essential (primary) hypertension J44.9 Chronic obstructive pulmonary disease, unspecified Facility Procedures CPT4 Code: 63335456 Description: (Facility Use Only) [phone] - [place] [facility] LT LEG Modifier: Quantity: 1 Physician Procedures CPT4 Code Description: [id number] - WC PHYS LEVEL 4 - EST PT ICD-10 Diagnosis Description E11.622 Type 2 diabetes mellitus with other skin ulcer I89.0 Lymphedema, not elsewhere classified I87.2 Venous insufficiency (chronic) (peripheral) L97.822  Non-pressure chronic ulcer of other part of left lower leg wit Modifier: h fat layer expose Quantity: 1 d Electronic Signature(s) Signed: 11/22/2018 3:17:02 PM By: Worthy Keeler PA-C Entered By: Worthy Keeler on 11/22/2018 15:17:01

## 2018-11-22 NOTE — Progress Notes (Addendum)
[place] ([id number]) Visit Report for 11/22/2018 Arrival Information Details Patient Name: [name], [name]. Date of Service: 11/22/2018 2:45 PM Medical Record Number: [id number] Patient Account Number: [id number] Date of Birth/Sex: 1924-03-22 (85 y.o. F) Treating RN: Harold Barban Primary Care Ronit Marczak: Myrtie Hawk Other Clinician: Referring Asier Desroches: Myrtie Hawk Treating Aziah Kaiser/Extender: Melburn Hake, HOYT Weeks in Treatment: 4 Visit Information History Since Last Visit Added or deleted any medications: No Patient Arrived: Wheel Chair Any new allergies or adverse reactions: No Arrival Time: 14:48 Had a fall or experienced change in No Accompanied By: caregiver activities of daily living that may affect Transfer Assistance: EasyPivot Patient risk of falls: Lift Signs or symptoms of abuse/neglect since last visito No Patient Identification Verified: Yes Hospitalized since last visit: No Secondary Verification Process Yes Has Dressing in Place as Prescribed: Yes Completed: Has Compression in Place as Prescribed: No Patient Has Alerts: Yes Pain Present Now: No Patient Alerts: ABI 04/30/18 L [employer] R [place] TBI L .70 R .98 DMII Electronic Signature(s) Signed: 11/22/2018 4:36:59 PM By: Harold Barban Entered By: Harold Barban on 11/22/2018 14:49:05 [name], [name] ([id number]) -------------------------------------------------------------------------------- Compression Therapy Details Patient Name: [name], Cassandra M. Date of Service: 11/22/2018 2:45 PM Medical Record Number: [id number] Patient Account Number: [id number] Date of Birth/Sex: 04/15/1924 (85 y.o. F) Treating RN: Army Melia Primary Care Zahriah Roes: Myrtie Hawk Other Clinician: Referring Senita Corredor: Myrtie Hawk Treating Channon Ambrosini/Extender: Melburn Hake, HOYT Weeks in Treatment: 4 Compression Therapy Performed for Wound Assessment: Wound #6  Left,Medial Lower Leg Performed By: Clinician Army Melia, RN Compression Type: Three Layer Pre Treatment ABI: 0.7 Post Procedure Diagnosis Same as Pre-procedure Electronic Signature(s) Signed: 11/23/2018 4:09:41 PM By: Army Melia Entered By: Army Melia on 11/22/2018 15:10:24 [name], [name] ([id number]) -------------------------------------------------------------------------------- Encounter Discharge Information Details Patient Name: [name], Cassandra M. Date of Service: 11/22/2018 2:45 PM Medical Record Number: [id number] Patient Account Number: [id number] Date of Birth/Sex: Aug 25, 1924 (85 y.o. F) Treating RN: Army Melia Primary Care Orlando Thalmann: Myrtie Hawk Other Clinician: Referring Cordia Miklos: Myrtie Hawk Treating Haasini Patnaude/Extender: Melburn Hake, HOYT Weeks in Treatment: 4 Encounter Discharge Information Items Discharge Condition: Stable Ambulatory Status: Wheelchair Discharge Destination: Home Transportation: Private Auto Accompanied By: caregiver Schedule Follow-up Appointment: Yes Clinical Summary of Care: Electronic Signature(s) Signed: 11/23/2018 4:09:41 PM By: Army Melia Entered By: Army Melia on 11/22/2018 15:13:33 [name], [name] ([id number]) -------------------------------------------------------------------------------- Lower Extremity Assessment Details Patient Name: [name], Cassandra M. Date of Service: 11/22/2018 2:45 PM Medical Record Number: [id number] Patient Account Number: [id number] Date of Birth/Sex: [birth year] (85 y.o. F) Treating RN: Harold Barban Primary Care Iasia Forcier: Myrtie Hawk Other Clinician: Referring Nethan Caudillo: Myrtie Hawk Treating Capri Raben/Extender: Melburn Hake, HOYT Weeks in Treatment: 4 Edema Assessment Assessed: [Left: No] [Right: No] [Left: Edema] [Right: :] Calf Left: Right: Point of Measurement: 30 cm From Medial Instep 36 cm cm Ankle Left: Right: Point  of Measurement: 10 cm From Medial Instep 27.5 cm cm Vascular Assessment Pulses: Dorsalis Pedis Palpable: [Left:Yes] Posterior Tibial Palpable: [Left:Yes] Electronic Signature(s) Signed: 11/22/2018 4:36:59 PM By: Harold Barban Entered By: Harold Barban on 11/22/2018 15:00:13 [name], [name] ([id number]) -------------------------------------------------------------------------------- Multi Wound Chart Details Patient Name: [name], Cassandra M. Date of Service: 11/22/2018 2:45 PM Medical Record Number: [id number] Patient Account Number: [id number] Date of Birth/Sex: [birth year] (85 y.o. F) Treating RN: Army Melia Primary Care Tallis Soledad: Myrtie Hawk Other Clinician: Referring Atha Mcbain: Myrtie Hawk Treating Kenechukwu Eckstein/Extender: STONE III, HOYT Weeks in Treatment: 4 Vital Signs Height(in): 60 Pulse(bpm): 75 Weight(lbs): 190 Blood  Pressure(mmHg): 154/68 Body Mass Index(BMI): 37 Temperature(F): 99.1 Respiratory Rate 16 (breaths/min): Photos: [N/A:N/A] Wound Location: Left Lower Leg - Medial N/A N/A Wounding Event: Gradually Appeared N/A N/A Primary Etiology: Diabetic Wound/Ulcer of the N/A N/A Lower Extremity Comorbid History: Cataracts, Glaucoma, N/A N/A Arrhythmia, Congestive Heart Failure, Coronary Artery Disease, Hypertension, Type II Diabetes, Gout, Osteoarthritis Date Acquired: 11/14/2018 N/A N/A Weeks of Treatment: 0 N/A N/A Wound Status: Open N/A N/A Measurements L x W x D 1.8x1.5x0.1 N/A N/A (cm) Area (cm) : 2.121 N/A N/A Volume (cm) : 0.212 N/A N/A Classification: Grade 2 N/A N/A Exudate Amount: Large N/A N/A Exudate Type: Serous N/A N/A Exudate Color: amber N/A N/A Wound Margin: Flat and Intact N/A N/A Granulation Amount: Small (1-33%) N/A N/A Granulation Quality: Pale N/A N/A Necrotic Amount: Large (67-100%) N/A N/A Exposed Structures: Fat Layer (Subcutaneous N/A N/A Tissue) Exposed: Yes Fascia: No Tendon:  No Muscle: No [name], Cassandra M. ([id number]) Joint: No Bone: No Epithelialization: None N/A N/A Treatment Notes Electronic Signature(s) Signed: 11/23/2018 4:09:41 PM By: Army Melia Entered By: Army Melia on 11/22/2018 15:07:57 [place], [name] ([id number]) -------------------------------------------------------------------------------- Junction Details Patient Name: [name], Brigitta M. Date of Service: 11/22/2018 2:45 PM Medical Record Number: [id number] Patient Account Number: [id number] Date of Birth/Sex: 1924-03-06 (85 y.o. F) Treating RN: Army Melia Primary Care Sherryll Skoczylas: Myrtie Hawk Other Clinician: Referring Anapaola Kinsel: Myrtie Hawk Treating Merion Caton/Extender: Melburn Hake, HOYT Weeks in Treatment: 4 Active Inactive Electronic Signature(s) Signed: 11/23/2018 4:09:41 PM By: Army Melia Entered By: Army Melia on 11/22/2018 15:07:48 [name], [name] ([id number]) -------------------------------------------------------------------------------- Pain Assessment Details Patient Name: [name], Dian M. Date of Service: 11/22/2018 2:45 PM Medical Record Number: [id number] Patient Account Number: [id number] Date of Birth/Sex: 1924-08-20 (85 y.o. F) Treating RN: Harold Barban Primary Care Axcel Horsch: Myrtie Hawk Other Clinician: Referring Ferol Laiche: Myrtie Hawk Treating Kaire Stary/Extender: Melburn Hake, HOYT Weeks in Treatment: 4 Active Problems Location of Pain Severity and Description of Pain Patient Has Paino No Site Locations Pain Management and Medication Current Pain Management: Electronic Signature(s) Signed: 11/22/2018 4:36:59 PM By: Harold Barban Entered By: Harold Barban on 11/22/2018 14:49:31 [place] ([id number]) -------------------------------------------------------------------------------- Patient/Caregiver Education Details Patient Name:  [name], Annel M. Date of Service: 11/22/2018 2:45 PM Medical Record Number: [id number] Patient Account Number: [id number] Date of Birth/Gender: December 26, 1924 (85 y.o. F) Treating RN: Army Melia Primary Care Physician: Myrtie Hawk Other Clinician: Referring Physician: Myrtie Hawk Treating Physician/Extender: Sharalyn Ink in Treatment: 4 Education Assessment Education Provided To: Patient Education Topics Provided Wound/Skin Impairment: Handouts: Caring for Your Ulcer Methods: Demonstration, Explain/Verbal Responses: State content correctly Electronic Signature(s) Signed: 11/23/2018 4:09:41 PM By: Army Melia Entered By: Army Melia on 11/22/2018 15:12:54 [name], [name] ([id number]) -------------------------------------------------------------------------------- Wound Assessment Details Patient Name: [name], Tariyah M. Date of Service: 11/22/2018 2:45 PM Medical Record Number: [id number] Patient Account Number: [id number] Date of Birth/Sex: 1924-09-06 (85 y.o. F) Treating RN: Harold Barban Primary Care Trivia Heffelfinger: Myrtie Hawk Other Clinician: Referring Arth Nicastro: Myrtie Hawk Treating Aryanna Shaver/Extender: Melburn Hake, HOYT Weeks in Treatment: 4 Wound Status Wound Number: 6 Primary Diabetic Wound/Ulcer of the Lower Extremity Etiology: Wound Location: Left Lower Leg - Medial Wound Open Wounding Event: Gradually Appeared Status: Date Acquired: 11/14/2018 Comorbid Cataracts, Glaucoma, Arrhythmia, Congestive Weeks Of Treatment: 0 History: Heart Failure, Coronary Artery Disease, Clustered Wound: No Hypertension, Type II Diabetes, Gout, Osteoarthritis Photos Wound Measurements Length: (cm) 1.8 % Reduction Width: (cm) 1.5 % Reduction Depth: (cm) 0.1 Epithelializ Area: (cm) 2.121  Tunneling: Volume: (cm) 0.212 Undermining in Area: in Volume: ation: None No : No Wound Description Classification: Grade  2 Foul Odor Af Wound Margin: Flat and Intact Slough/Fibri Exudate Amount: Large Exudate Type: Serous Exudate Color: amber ter Cleansing: No no Yes Wound Bed Granulation Amount: Small (1-33%) Exposed Structure Granulation Quality: Pale Fascia Exposed: No Necrotic Amount: Large (67-100%) Fat Layer (Subcutaneous Tissue) Exposed: Yes Necrotic Quality: Adherent Slough Tendon Exposed: No Muscle Exposed: No Joint Exposed: No Bone Exposed: No [name], Dereka M. ([id number]) Treatment Notes Wound #6 (Left, Medial Lower Leg) Notes silver cell, ABD, 3 layer Electronic Signature(s) Signed: 11/22/2018 4:36:59 PM By: Harold Barban Entered By: Harold Barban on 11/22/2018 14:56:44 [place] ([id number]) -------------------------------------------------------------------------------- Vitals Details Patient Name: [name], Syriana M. Date of Service: 11/22/2018 2:45 PM Medical Record Number: [id number] Patient Account Number: [id number] Date of Birth/Sex: 1924-06-03 (85 y.o. F) Treating RN: Harold Barban Primary Care Trinia Georgi: Myrtie Hawk Other Clinician: Referring Yashas Camilli: Myrtie Hawk Treating Zeth Buday/Extender: Melburn Hake, HOYT Weeks in Treatment: 4 Vital Signs Time Taken: 14:50 Temperature (F): 99.1 Height (in): 60 Pulse (bpm): 75 Source: Stated Respiratory Rate (breaths/min): 16 Weight (lbs): 190 Blood Pressure (mmHg): 154/68 Source: Stated Reference Range: 80 - 120 mg / dl Body Mass Index (BMI): 37.1 Electronic Signature(s) Signed: 11/22/2018 4:36:59 PM By: Harold Barban Entered By: Harold Barban on 11/22/2018 14:50:58

## 2018-11-29 ENCOUNTER — Other Ambulatory Visit: Payer: Self-pay

## 2018-11-29 ENCOUNTER — Encounter: Payer: PPO | Attending: Physician Assistant | Admitting: Physician Assistant

## 2018-11-29 DIAGNOSIS — Z841 Family history of disorders of kidney and ureter: Secondary | ICD-10-CM | POA: Diagnosis not present

## 2018-11-29 DIAGNOSIS — I872 Venous insufficiency (chronic) (peripheral): Secondary | ICD-10-CM | POA: Diagnosis not present

## 2018-11-29 DIAGNOSIS — I509 Heart failure, unspecified: Secondary | ICD-10-CM | POA: Insufficient documentation

## 2018-11-29 DIAGNOSIS — M1990 Unspecified osteoarthritis, unspecified site: Secondary | ICD-10-CM | POA: Insufficient documentation

## 2018-11-29 DIAGNOSIS — Z809 Family history of malignant neoplasm, unspecified: Secondary | ICD-10-CM | POA: Diagnosis not present

## 2018-11-29 DIAGNOSIS — E11622 Type 2 diabetes mellitus with other skin ulcer: Secondary | ICD-10-CM | POA: Diagnosis not present

## 2018-11-29 DIAGNOSIS — H409 Unspecified glaucoma: Secondary | ICD-10-CM | POA: Diagnosis not present

## 2018-11-29 DIAGNOSIS — I890 Lymphedema, not elsewhere classified: Secondary | ICD-10-CM | POA: Insufficient documentation

## 2018-11-29 DIAGNOSIS — I110 Hypertensive heart disease with heart failure: Secondary | ICD-10-CM | POA: Diagnosis not present

## 2018-11-29 DIAGNOSIS — I2510 Atherosclerotic heart disease of native coronary artery without angina pectoris: Secondary | ICD-10-CM | POA: Diagnosis not present

## 2018-11-29 DIAGNOSIS — M109 Gout, unspecified: Secondary | ICD-10-CM | POA: Insufficient documentation

## 2018-11-29 DIAGNOSIS — L97822 Non-pressure chronic ulcer of other part of left lower leg with fat layer exposed: Secondary | ICD-10-CM | POA: Diagnosis not present

## 2018-11-29 DIAGNOSIS — J449 Chronic obstructive pulmonary disease, unspecified: Secondary | ICD-10-CM | POA: Diagnosis not present

## 2018-11-29 DIAGNOSIS — Z833 Family history of diabetes mellitus: Secondary | ICD-10-CM | POA: Insufficient documentation

## 2018-11-29 DIAGNOSIS — Z8249 Family history of ischemic heart disease and other diseases of the circulatory system: Secondary | ICD-10-CM | POA: Insufficient documentation

## 2018-11-29 DIAGNOSIS — L97821 Non-pressure chronic ulcer of other part of left lower leg limited to breakdown of skin: Secondary | ICD-10-CM | POA: Diagnosis not present

## 2018-11-29 NOTE — Progress Notes (Addendum)
[place] ([id number]) Visit Report for 11/29/2018 Chief Complaint Document Details Patient Name: [name], [name]. Date of Service: 11/29/2018 2:30 PM Medical Record Number: [id number] Patient Account Number: [id number] Date of Birth/Sex: 10/05/1924 (85 y.o. F) Treating RN: Army Melia Primary Care Provider: Myrtie Hawk Other Clinician: Referring Provider: Myrtie Hawk Treating Provider/Extender: Melburn Hake, HOYT Weeks in Treatment: 5 Information Obtained from: Patient Chief Complaint Bilateral LE lymphedema with left leg ulcer Electronic Signature(s) Signed: 11/29/2018 2:42:40 PM By: Worthy Keeler PA-C Entered By: Worthy Keeler on 11/29/2018 14:42:40 [place] ([id number]) -------------------------------------------------------------------------------- HPI Details Patient Name: [name], [name] Date of Service: 11/29/2018 2:30 PM Medical Record Number: [id number] Patient Account Number: [id number] Date of Birth/Sex: 12-Feb-1925 (85 y.o. F) Treating RN: Army Melia Primary Care Provider: Myrtie Hawk Other Clinician: Referring Provider: Myrtie Hawk Treating Provider/Extender: Melburn Hake, HOYT Weeks in Treatment: 5 History of Present Illness HPI Description: 04/17/18 on evaluation today patient presents for initial inspection in our clinic concerning issues that she has been having with her gluteal region. Things actually appear to be doing much better at this point although we could go her daughter tells me that the patient was actually having areas that were draining in the gluteal region bilaterally. Fortunately these regions appear to have resolved. Still there is evidence of pressure injury to the gluteal region she has blanchable erythema but decreased capillary refill to the area which again is an early sign of pressure. Again I do think that her daily activity may be the cause of such. She  does have a history of diabetes with a hemoglobin A1c of 7.8 on 04/11/18. She also has gout and hypertension. The patient does get around in her home according to what her daughter tells me. However she spends most of her day and actually her night as well in her recliner which I think is likely where the pressure is coming from. She tells me that she doesn't sleep in a bed and pretty much unless she's getting up to use the bathroom or eat she is in her recliner. This again I think is likely the biggest issue that we're seeing right now for what's causing the pressure to her gluteal region. 08/02/18 on evaluation today patient actually presents for reevaluation in clinic concerning issues that she's been having with her gluteal region which the good news is appears to be completely healed at this time. There's no open wound currently. She did have bilateral lower extremity lymphedema which was starting to week more on the right lower extremity especially. With that being said this seems to be also doing better in resolving she's not wearing the compression stockings of I think she really is that compression stockings pretty much all the time. She does have TBI's that were performed and finalized on 04/30/18 they appear to be doing well with a right TBI of 0.70 in the left TBI of 0.98. Overall I feel like the patient is actually doing quite well although we do need to have some things that we go through with them as far as teaching is concerned in order to prevent issues from showing up in the future. No fevers, chills, nausea, or vomiting noted at this time. 10/19/2018 upon evaluation today patient presents for reevaluation our clinic concerning issues that she is actually having with her bilateral lower extremities. I have seen her a couple times previous where we have discussed a wound on her gluteal region which actually was not  giving her any trouble technically the last time I saw her. She is always  had issues with her legs and swelling she does have lymphedema pumps. In fact by some crazy coincidence she actually ended up having them ordered from 2 different locations all likely from the congestive heart failure clinic as well as vascular and ended up with 2 sets of lymphedema pumps. Nonetheless 1 of them she got covered by insurance the other they had to pay $700 for. Obviously I am not exactly sure what is going on in this regard but she obviously does not need to have 2 sets of pumps. Fortunately there are no signs of infection in regard to patient's bilateral lower extremities. She has been tolerating the dressing changes without complication. No fevers, chills, nausea, vomiting, or diarrhea. She was wearing compression stockings for some time until her leg started to weep. She was also using the lymphedema pumps according to her daughter who was present during the visit today until she started having issues with the weeping as well and then they were sure that she continue to use them. 10/26/2018 on evaluation today patient appears to be doing well with regard to her lower extremity ulcers. In fact we have actually gotten everything down to just 2 open areas and on the left this is just one very small region in fact. Overall I feel like she has done excellent in the short amount of time that we have been taking care of her legs and overall and very pleased in this regard. No fevers, chills, nausea, vomiting, or diarrhea. 11/02/18 on evaluation today patient appears to be doing excellent with regard to her bilateral lower extremities. She has been tolerating the dressing changes without complication. Fortunately she appears to be completely healed upon evaluation today. 11/22/2018 on evaluation today patient appears to be doing unfortunately somewhat worse compared to when I last saw her. She has a new blister that is open since I last evaluated her unfortunately. The last time I did see her  was actually on 9//20. Upon evaluation today she appears to have no signs of systemic infection which is good news there is also no signs of local infection I feel like this was simply more of a blister that came open. Her caregiver who is with her today states that when she is with her she has her use her lymphedema pumps twice a day. However she cannot speak to when others are caring for [facility] ROSITA, GUZZETTA. ([id number]) her. She also states that she wears the Velcro compression wraps on a regular basis. 11/29/2018 on evaluation today patient appears to be doing excellent in regard to her left lower extremity ulcer which actually shown signs of being completely healed. This has done an excellent job as far as healing is concerned and I am very pleased in this regard Electronic Signature(s) Signed: 11/29/2018 2:50:03 PM By: Worthy Keeler PA-C Entered By: Worthy Keeler on 11/29/2018 14:50:03 [place], [name] ([id number]) -------------------------------------------------------------------------------- Physical Exam Details Patient Name: [name], Brittyn M. Date of Service: 11/29/2018 2:30 PM Medical Record Number: [id number] Patient Account Number: [id number] Date of Birth/Sex: 09-04-1924 (85 y.o. F) Treating RN: Army Melia Primary Care Provider: Myrtie Hawk Other Clinician: Referring Provider: Myrtie Hawk Treating Provider/Extender: STONE III, HOYT Weeks in Treatment: 5 Constitutional Well-nourished and well-hydrated in no acute distress. Respiratory normal breathing without difficulty. Psychiatric this patient is able to make decisions and demonstrates good insight into disease process. Alert and Oriented x  3. pleasant and cooperative. Notes Patient's wound bed currently shows evidence of complete epithelization and seems to be doing excellent at this time. There does not appear to be any evidence of infection and her swelling is under  great control we have been using the compression wraps. Subsequently at this time I think that the patient does need to use her lymphedema pumps as well as the Velcro compression wraps on a regular basis currently. Electronic Signature(s) Signed: 11/29/2018 2:50:35 PM By: Worthy Keeler PA-C Entered By: Worthy Keeler on 11/29/2018 14:50:34 [place] ([id number]) -------------------------------------------------------------------------------- Physician Orders Details Patient Name: [name], Kendyl M. Date of Service: 11/29/2018 2:30 PM Medical Record Number: [id number] Patient Account Number: [id number] Date of Birth/Sex: [birth year] (85 y.o. F) Treating RN: Army Melia Primary Care Provider: Myrtie Hawk Other Clinician: Referring Provider: Myrtie Hawk Treating Provider/Extender: Melburn Hake, HOYT Weeks in Treatment: 5 Verbal / Phone Orders: No Diagnosis Coding ICD-10 Coding Code Description E11.622 Type 2 diabetes mellitus with other skin ulcer I89.0 Lymphedema, not elsewhere classified I87.2 Venous insufficiency (chronic) (peripheral) L97.822 Non-pressure chronic ulcer of other part of left lower leg with fat layer exposed I10 Essential (primary) hypertension J44.9 Chronic obstructive pulmonary disease, unspecified Edema Control o Patient to wear own Velcro compression garment. - Needs to be put on first thing in the morning and taken off before bed. Apply lotion at bedtime o Compression Pump: Use compression pump on left lower extremity for 60 minutes, twice daily. - Imperative that patient does this for both leg twice a day o Compression Pump: Use compression pump on right lower extremity for 60 minutes, twice daily. - Imperative that patient does this for both leg twice a day [facility]charge From [facility] o Discharge from [place] - treatment complete. Electronic Signature(s) Signed: 11/29/2018 5:01:07 PM By: Army Melia Signed: 11/29/2018 5:50:00 PM By: Worthy Keeler PA-C Entered By: Army Melia on 11/29/2018 14:47:56 [place], [name] ([id number]) -------------------------------------------------------------------------------- Problem List Details Patient Name: [name], Micayla M. Date of Service: 11/29/2018 2:30 PM Medical Record Number: [id number] Patient Account Number: [id number] Date of Birth/Sex: 1924/11/19 (85 y.o. F) Treating RN: Army Melia Primary Care Provider: Myrtie Hawk Other Clinician: Referring Provider: Myrtie Hawk Treating Provider/Extender: Melburn Hake, HOYT Weeks in Treatment: 5 Active Problems ICD-10 Evaluated Encounter Code Description Active Date Today Diagnosis E11.622 Type 2 diabetes mellitus with other skin ulcer 10/19/2018 No Yes I89.0 Lymphedema, not elsewhere classified 10/19/2018 No Yes I87.2 Venous insufficiency (chronic) (peripheral) 10/19/2018 No Yes L97.822 Non-pressure chronic ulcer of other part of left lower leg with 10/19/2018 No Yes fat layer exposed I10 Essential (primary) hypertension 10/19/2018 No Yes J44.9 Chronic obstructive pulmonary disease, unspecified 10/19/2018 No Yes Inactive Problems Resolved Problems ICD-10 Code Description Active Date Resolved Date L97.812 Non-pressure chronic ulcer of other part of right lower leg with fat 10/19/2018 10/19/2018 layer exposed Electronic Signature(s) Signed: 11/29/2018 2:42:29 PM By: Worthy Keeler PA-C Entered By: Worthy Keeler on 11/29/2018 14:42:29 [place] ([id number]) [place], [name] ([id number]) -------------------------------------------------------------------------------- Progress Note Details Patient Name: [name], Lana M. Date of Service: 11/29/2018 2:30 PM Medical Record Number: [id number] Patient Account Number: [id number] Date of Birth/Sex: 06-Feb-1925 (85 y.o. F) Treating RN: Army Melia Primary Care Provider:  Myrtie Hawk Other Clinician: Referring Provider: Myrtie Hawk Treating Provider/Extender: Melburn Hake, HOYT Weeks in Treatment: 5 Subjective Chief Complaint Information obtained from Patient Bilateral LE lymphedema with left leg ulcer History of Present Illness (HPI) 04/17/18 on  evaluation today patient presents for initial inspection in our clinic concerning issues that she has been having with her gluteal region. Things actually appear to be doing much better at this point although we could go her daughter tells me that the patient was actually having areas that were draining in the gluteal region bilaterally. Fortunately these regions appear to have resolved. Still there is evidence of pressure injury to the gluteal region she has blanchable erythema but decreased capillary refill to the area which again is an early sign of pressure. Again I do think that her daily activity may be the cause of such. She does have a history of diabetes with a hemoglobin A1c of 7.8 on 04/11/18. She also has gout and hypertension. The patient does get around in her home according to what her daughter tells me. However she spends most of her day and actually her night as well in her recliner which I think is likely where the pressure is coming from. She tells me that she doesn't sleep in a bed and pretty much unless she's getting up to use the bathroom or eat she is in her recliner. This again I think is likely the biggest issue that we're seeing right now for what's causing the pressure to her gluteal region. 08/02/18 on evaluation today patient actually presents for reevaluation in clinic concerning issues that she's been having with her gluteal region which the good news is appears to be completely healed at this time. There's no open wound currently. She did have bilateral lower extremity lymphedema which was starting to week more on the right lower extremity especially. With that being said  this seems to be also doing better in resolving she's not wearing the compression stockings of I think she really is that compression stockings pretty much all the time. She does have TBI's that were performed and finalized on 04/30/18 they appear to be doing well with a right TBI of 0.70 in the left TBI of 0.98. Overall I feel like the patient is actually doing quite well although we do need to have some things that we go through with them as far as teaching is concerned in order to prevent issues from showing up in the future. No fevers, chills, nausea, or vomiting noted at this time. 10/19/2018 upon evaluation today patient presents for reevaluation our clinic concerning issues that she is actually having with her bilateral lower extremities. I have seen her a couple times previous where we have discussed a wound on her gluteal region which actually was not giving her any trouble technically the last time I saw her. She is always had issues with her legs and swelling she does have lymphedema pumps. In fact by some crazy coincidence she actually ended up having them ordered from 2 different locations all likely from the congestive heart failure clinic as well as vascular and ended up with 2 sets of lymphedema pumps. Nonetheless 1 of them she got covered by insurance the other they had to pay $700 for. Obviously I am not exactly sure what is going on in this regard but she obviously does not need to have 2 sets of pumps. Fortunately there are no signs of infection in regard to patient's bilateral lower extremities. She has been tolerating the dressing changes without complication. No fevers, chills, nausea, vomiting, or diarrhea. She was wearing compression stockings for some time until her leg started to weep. She was also using the lymphedema pumps according to her daughter who was  present during the visit today until she started having issues with the weeping as well and then they were sure that she  continue to use them. 10/26/2018 on evaluation today patient appears to be doing well with regard to her lower extremity ulcers. In fact we have actually gotten everything down to just 2 open areas and on the left this is just one very small region in fact. Overall I feel like she has done excellent in the short amount of time that we have been taking care of her legs and overall and very pleased in this regard. No fevers, chills, nausea, vomiting, or diarrhea. 11/02/18 on evaluation today patient appears to be doing excellent with regard to her bilateral lower extremities. She has been tolerating the dressing changes without complication. Fortunately she appears to be completely healed upon evaluation today. [place] ([id number]) 11/22/2018 on evaluation today patient appears to be doing unfortunately somewhat worse compared to when I last saw her. She has a new blister that is open since I last evaluated her unfortunately. The last time I did see her was actually on 9//20. Upon evaluation today she appears to have no signs of systemic infection which is good news there is also no signs of local infection I feel like this was simply more of a blister that came open. Her caregiver who is with her today states that when she is with her she has her use her lymphedema pumps twice a day. However she cannot speak to when others are caring for her. She also states that she wears the Velcro compression wraps on a regular basis. 11/29/2018 on evaluation today patient appears to be doing excellent in regard to her left lower extremity ulcer which actually shown signs of being completely healed. This has done an excellent job as far as healing is concerned and I am very pleased in this regard Patient History Information obtained from Patient. Family History Cancer - Child, Diabetes - Siblings, Heart Disease - Father,Child, Hypertension - Child, Thyroid Problems - Siblings, No family history  of Kidney Disease, Lung Disease, Seizures, Tuberculosis. Social History Never smoker, Marital Status - Widowed, Alcohol Use - Never, Drug Use - No History, Caffeine Use - Moderate. Medical History Eyes Patient has history of Cataracts - removed, Glaucoma Denies history of Optic Neuritis Ear/Nose/Mouth/Throat Denies history of Chronic sinus problems/congestion, Middle ear problems Hematologic/Lymphatic Denies history of Anemia, Hemophilia, Human Immunodeficiency Virus, Lymphedema, Sickle Cell Disease Respiratory Denies history of Aspiration, Asthma, Chronic Obstructive Pulmonary Disease (COPD), Pneumothorax, Sleep Apnea, Tuberculosis Cardiovascular Patient has history of Arrhythmia, Congestive Heart Failure, Coronary Artery Disease, Hypertension Denies history of Angina, Hypotension, Myocardial Infarction, Peripheral Arterial Disease, Peripheral Venous Disease, Phlebitis, Vasculitis Gastrointestinal Denies history of Cirrhosis , Colitis, Crohn s, Hepatitis A, Hepatitis B, Hepatitis C Endocrine Patient has history of Type II Diabetes Denies history of Type I Diabetes Genitourinary Denies history of End Stage Renal Disease Immunological Denies history of Lupus Erythematosus, Raynaud s, Scleroderma Integumentary (Skin) Denies history of History of Burn, History of pressure wounds Musculoskeletal Patient has history of Gout, Osteoarthritis - bilateral knees Denies history of Rheumatoid Arthritis, Osteomyelitis Neurologic Denies history of Dementia, Neuropathy, Quadriplegia, Paraplegia, Seizure Disorder Oncologic Denies history of Received Chemotherapy, Received Radiation Psychiatric Denies history of Anorexia/bulimia, Confinement Anxiety Hospitalization/Surgery History - Heart valve replacement. [place] ([id number]) Review of Systems (ROS) Constitutional Symptoms (General Health) Denies complaints or symptoms of Fatigue, Fever, Chills, Marked Weight  Change. Respiratory Denies complaints or symptoms of  Chronic or frequent coughs, Shortness of Breath. Cardiovascular Complains or has symptoms of LE edema. Denies complaints or symptoms of Chest pain. Psychiatric Denies complaints or symptoms of Anxiety, Claustrophobia. Objective Constitutional Well-nourished and well-hydrated in no acute distress. Vitals Time Taken: 2:21 PM, Height: 60 in, Weight: 190 lbs, BMI: 37.1, Temperature: 99.0 F, Pulse: 77 bpm, Respiratory Rate: 16 breaths/min, Blood Pressure: 161/69 mmHg. Respiratory normal breathing without difficulty. Psychiatric this patient is able to make decisions and demonstrates good insight into disease process. Alert and Oriented x 3. pleasant and cooperative. General Notes: Patient's wound bed currently shows evidence of complete epithelization and seems to be doing excellent at this time. There does not appear to be any evidence of infection and her swelling is under great control we have been using the compression wraps. Subsequently at this time I think that the patient does need to use her lymphedema pumps as well as the Velcro compression wraps on a regular basis currently. Integumentary (Hair, Skin) Wound #6 status is Healed - Epithelialized. Original cause of wound was Gradually Appeared. The wound is located on the Left,Medial Lower Leg. The wound measures 0cm length x 0cm width x 0cm depth; 0cm^2 area and 0cm^3 volume. The wound is limited to skin breakdown. There is no tunneling or undermining noted. There is a none present amount of drainage noted. The wound margin is flat and intact. There is no granulation within the wound bed. There is no necrotic tissue within the wound bed. Assessment Active Problems ICD-10 Type 2 diabetes mellitus with other skin ulcer Lymphedema, not elsewhere classified [name], Orpah M. ([id number]) Venous insufficiency (chronic) (peripheral) Non-pressure chronic ulcer of other  part of left lower leg with fat layer exposed Essential (primary) hypertension Chronic obstructive pulmonary disease, unspecified Plan Edema Control: Patient to wear own Velcro compression garment. - Needs to be put on first thing in the morning and taken off before bed. Apply lotion at bedtime Compression Pump: Use compression pump on left lower extremity for 60 minutes, twice daily. - Imperative that patient does this for both leg twice a day Compression Pump: Use compression pump on right lower extremity for 60 minutes, twice daily. - Imperative that patient does this for both leg twice a day [facility]charge From [facility] Services: Discharge from [place] - treatment complete. 1. I would recommend that we discontinue wound care services at this time and the patient is in agreement with the plan as is her caregiver. 2. I did make a note above that we need to have the patient use the lymphedema pumps twice a day for 60 minutes at a time every day and this is definitely important for her to do. 3. She also needs to be wearing her Velcro compression wraps put on in the morning not taken off until bedtime and lotion can be applied at bedtime. This will help and aid along with the lymphedema pumps and keeping the edema under control and preventing sores. We will see patient back for reevaluation in 1 week here in the clinic. If anything worsens or changes patient will contact our office for additional recommendations. Electronic Signature(s) Signed: 11/29/2018 2:51:12 PM By: Worthy Keeler PA-C Entered By: Worthy Keeler on 11/29/2018 14:51:11 [place] ([id number]) -------------------------------------------------------------------------------- ROS/PFSH Details Patient Name: [name], Keta M. Date of Service: 11/29/2018 2:30 PM Medical Record Number: [id number] Patient Account Number: [id number] Date of Birth/Sex: [birth year] (85 y.o. F) Treating RN: Army Melia Primary Care Provider: Myrtie Hawk Other  Clinician: Referring Provider: Myrtie Hawk Treating Provider/Extender: Melburn Hake, HOYT Weeks in Treatment: 5 Information Obtained From Patient Constitutional Symptoms (General Health) Complaints and Symptoms: Negative for: Fatigue; Fever; Chills; Marked Weight Change Respiratory Complaints and Symptoms: Negative for: Chronic or frequent coughs; Shortness of Breath Medical History: Negative for: Aspiration; Asthma; Chronic Obstructive Pulmonary Disease (COPD); Pneumothorax; Sleep Apnea; Tuberculosis Cardiovascular Complaints and Symptoms: Positive for: LE edema Negative for: Chest pain Medical History: Positive for: Arrhythmia; Congestive Heart Failure; Coronary Artery Disease; Hypertension Negative for: Angina; Hypotension; Myocardial Infarction; Peripheral Arterial Disease; Peripheral Venous Disease; Phlebitis; Vasculitis Psychiatric Complaints and Symptoms: Negative for: Anxiety; Claustrophobia Medical History: Negative for: Anorexia/bulimia; Confinement Anxiety Eyes Medical History: Positive for: Cataracts - removed; Glaucoma Negative for: Optic Neuritis Ear/Nose/Mouth/Throat Medical History: Negative for: Chronic sinus problems/congestion; Middle ear problems Hematologic/Lymphatic SHOFFNER ALYSS, GRANATO. ([id number]) Medical History: Negative for: Anemia; Hemophilia; Human Immunodeficiency Virus; Lymphedema; Sickle Cell Disease Gastrointestinal Medical History: Negative for: Cirrhosis ; Colitis; Crohnos; Hepatitis A; Hepatitis B; Hepatitis C Endocrine Medical History: Positive for: Type II Diabetes Negative for: Type I Diabetes Time with diabetes: 20 years Treated with: Oral agents Blood sugar tested every day: Yes Tested : twice daily Genitourinary Medical History: Negative for: End Stage Renal Disease Immunological Medical History: Negative for: Lupus Erythematosus; Raynaudos;  Scleroderma Integumentary (Skin) Medical History: Negative for: History of Burn; History of pressure wounds Musculoskeletal Medical History: Positive for: Gout; Osteoarthritis - bilateral knees Negative for: Rheumatoid Arthritis; Osteomyelitis Neurologic Medical History: Negative for: Dementia; Neuropathy; Quadriplegia; Paraplegia; Seizure Disorder Oncologic Medical History: Negative for: Received Chemotherapy; Received Radiation HBO Extended History Items Eyes: Eyes: Cataracts Glaucoma Immunizations Pneumococcal Vaccine: Received Pneumococcal Vaccination: Yes Implantable Devices [employer] ([id number]) None Hospitalization / Surgery History Type of Hospitalization/Surgery Heart valve replacement Family and Social History Cancer: Yes - Child; Diabetes: Yes - Siblings; Heart Disease: Yes - Father,Child; Hypertension: Yes - Child; Kidney Disease: No; Lung Disease: No; Seizures: No; Thyroid Problems: Yes - Siblings; Tuberculosis: No; Never smoker; Marital Status - Widowed; Alcohol Use: Never; Drug Use: No History; Caffeine Use: Moderate Physician Affirmation I have reviewed and agree with the above information. Electronic Signature(s) Signed: 11/29/2018 5:01:07 PM By: Army Melia Signed: 11/29/2018 5:50:00 PM By: Worthy Keeler PA-C Entered By: Worthy Keeler on 11/29/2018 14:50:19 [place] ([id number]) -------------------------------------------------------------------------------- SuperBill Details Patient Name: [name], Shwanda M. Date of Service: 11/29/2018 Medical Record Number: [id number] Patient Account Number: [id number] Date of Birth/Sex: 1925-01-29 (85 y.o. F) Treating RN: Army Melia Primary Care Provider: Myrtie Hawk Other Clinician: Referring Provider: Myrtie Hawk Treating Provider/Extender: Melburn Hake, HOYT Weeks in Treatment: 5 Diagnosis Coding ICD-10 Codes Code Description E11.622 Type 2 diabetes  mellitus with other skin ulcer I89.0 Lymphedema, not elsewhere classified I87.2 Venous insufficiency (chronic) (peripheral) L97.822 Non-pressure chronic ulcer of other part of left lower leg with fat layer exposed I10 Essential (primary) hypertension J44.9 Chronic obstructive pulmonary disease, unspecified Facility Procedures CPT4 Code: 57322025 Description: 99213 - WOUND CARE VISIT-LEV 3 EST PT Modifier: Quantity: 1 Physician Procedures CPT4 Code Description: 4270623 99213 - WC PHYS LEVEL 3 - EST PT ICD-10 Diagnosis Description I89.0 Lymphedema, not elsewhere classified E11.622 Type 2 diabetes mellitus with other skin ulcer I87.2 Venous insufficiency (chronic) (peripheral) L97.822  Non-pressure chronic ulcer of other part of left lower leg wit Modifier: h fat layer expos Quantity: 1 ed Electronic Signature(s) Signed: 11/29/2018 2:51:27 PM By: Worthy Keeler PA-C Entered By: Worthy Keeler on 11/29/2018 14:51:27

## 2018-11-29 NOTE — Progress Notes (Signed)
[place] ([id number]) Visit Report for 11/29/2018 Arrival Information Details Patient Name: [name], [name]. Date of Service: 11/29/2018 2:30 PM Medical Record Number: [id number] Patient Account Number: [id number] Date of Birth/Sex: January 27, 1925 (85 y.o. F) Treating RN: Montey Hora Primary Care Nellene Courtois: Myrtie Hawk Other Clinician: Referring Tamorah Hada: Myrtie Hawk Treating Bea Duren/Extender: Melburn Hake, HOYT Weeks in Treatment: 5 Visit Information History Since Last Visit Added or deleted any medications: No Patient Arrived: Wheel Chair Any new allergies or adverse reactions: No Arrival Time: 14:21 Had a fall or experienced change in No Accompanied By: daughter activities of daily living that may affect Transfer Assistance: Manual risk of falls: Patient Identification Verified: Yes Signs or symptoms of abuse/neglect since last visito No Secondary Verification Process Yes Hospitalized since last visit: No Completed: Implantable device outside of the clinic excluding No Patient Has Alerts: Yes cellular tissue based products placed in the center Patient Alerts: ABI 04/30/18 L [place] R since last visit: [place] Has Dressing in Place as Prescribed: Yes TBI L .70 R .98 Has Compression in Place as Prescribed: Yes DMII Pain Present Now: No Electronic Signature(s) Signed: 11/29/2018 5:10:54 PM By: Montey Hora Entered By: Montey Hora on 11/29/2018 14:21:24 [name], [name] ([id number]) -------------------------------------------------------------------------------- Clinic Level of Care Assessment Details Patient Name: [name], Cassandra M. Date of Service: 11/29/2018 2:30 PM Medical Record Number: [id number] Patient Account Number: [id number] Date of Birth/Sex: Mar 16, 1924 (85 y.o. F) Treating RN: Army Melia Primary Care Taci Sterling: Myrtie Hawk Other Clinician: Referring Aleesha Ringstad: Myrtie Hawk Treating  Dalexa Gentz/Extender: Melburn Hake, HOYT Weeks in Treatment: 5 Clinic Level of Care Assessment Items TOOL 4 Quantity Score []  - Use when only an EandM is performed on FOLLOW-UP visit 0 ASSESSMENTS - Nursing Assessment / Reassessment X - Reassessment of Co-morbidities (includes updates in patient status) 1 10 X- 1 5 Reassessment of Adherence to Treatment Plan ASSESSMENTS - Wound and Skin Assessment / Reassessment X - Simple Wound Assessment / Reassessment - one wound 1 5 []  - 0 Complex Wound Assessment / Reassessment - multiple wounds []  - 0 Dermatologic / Skin Assessment (not related to wound area) ASSESSMENTS - Focused Assessment []  - Circumferential Edema Measurements - multi extremities 0 []  - 0 Nutritional Assessment / Counseling / Intervention []  - 0 Lower Extremity Assessment (monofilament, tuning fork, pulses) []  - 0 Peripheral Arterial Disease Assessment (using hand held doppler) ASSESSMENTS - Ostomy and/or Continence Assessment and Care []  - Incontinence Assessment and Management 0 []  - 0 Ostomy Care Assessment and Management (repouching, etc.) PROCESS - Coordination of Care X - Simple Patient / Family Education for ongoing care 1 15 []  - 0 Complex (extensive) Patient / Family Education for ongoing care X- 1 10 Staff obtains Programmer, systems, Records, Test Results / Process Orders []  - 0 Staff telephones HHA, Nursing Homes / Clarify orders / etc []  - 0 Routine Transfer to another Facility (non-emergent condition) []  - 0 Routine Hospital Admission (non-emergent condition) []  - 0 New Admissions / Biomedical engineer / Ordering NPWT, Apligraf, etc. []  - 0 Emergency Hospital Admission (emergent condition) X- 1 10 Simple Discharge Coordination [name], DASCOLI. ([id number]) []  - 0 Complex (extensive) Discharge Coordination PROCESS - Special Needs []  - Pediatric / Minor Patient Management 0 []  - 0 Isolation Patient Management []  - 0 Hearing / Language / Visual  special needs []  - 0 Assessment of Community assistance (transportation, D/C planning, etc.) []  - 0 Additional assistance / Altered mentation []  - 0 Support Surface(s) Assessment (bed, cushion, seat,  etc.) INTERVENTIONS - Wound Cleansing / Measurement X - Simple Wound Cleansing - one wound 1 5 []  - 0 Complex Wound Cleansing - multiple wounds X- 1 5 Wound Imaging (photographs - any number of wounds) []  - 0 Wound Tracing (instead of photographs) []  - 0 Simple Wound Measurement - one wound []  - 0 Complex Wound Measurement - multiple wounds INTERVENTIONS - Wound Dressings X - Small Wound Dressing one or multiple wounds 1 10 []  - 0 Medium Wound Dressing one or multiple wounds []  - 0 Large Wound Dressing one or multiple wounds []  - 0 Application of Medications - topical []  - 0 Application of Medications - injection INTERVENTIONS - Miscellaneous []  - External ear exam 0 []  - 0 Specimen Collection (cultures, biopsies, blood, body fluids, etc.) []  - 0 Specimen(s) / Culture(s) sent or taken to Lab for analysis []  - 0 Patient Transfer (multiple staff / Civil Service fast streamer / Similar devices) []  - 0 Simple Staple / Suture removal (25 or less) []  - 0 Complex Staple / Suture removal (26 or more) []  - 0 Hypo / Hyperglycemic Management (close monitor of Blood Glucose) []  - 0 Ankle / Brachial Index (ABI) - do not check if billed separately X- 1 5 Vital Signs [name], Cassandra M. ([id number]) Has the patient been seen at the hospital within the last three years: Yes Total Score: 80 Level Of Care: New/Established - Level 3 Electronic Signature(s) Signed: 11/29/2018 5:01:07 PM By: Army Melia Entered By: Army Melia on 11/29/2018 14:46:21 [place], [name] ([id number]) -------------------------------------------------------------------------------- Encounter Discharge Information Details Patient Name: [name], Cassandra M. Date of Service: 11/29/2018 2:30 PM Medical  Record Number: [id number] Patient Account Number: [id number] Date of Birth/Sex: Apr 22, 1924 (85 y.o. F) Treating RN: Army Melia Primary Care Trinady Milewski: Myrtie Hawk Other Clinician: Referring Gracy Ehly: Myrtie Hawk Treating Rahkeem Senft/Extender: Melburn Hake, HOYT Weeks in Treatment: 5 Encounter Discharge Information Items Discharge Condition: Stable Ambulatory Status: Wheelchair Discharge Destination: Home Transportation: Private Auto Accompanied By: caregiver Schedule Follow-up Appointment: Yes Clinical Summary of Care: Electronic Signature(s) Signed: 11/29/2018 5:01:07 PM By: Army Melia Entered By: Army Melia on 11/29/2018 14:48:30 [name], [name] ([id number]) -------------------------------------------------------------------------------- Lower Extremity Assessment Details Patient Name: [name], Kaydyn M. Date of Service: 11/29/2018 2:30 PM Medical Record Number: [id number] Patient Account Number: [id number] Date of Birth/Sex: Oct 18, 1924 (85 y.o. F) Treating RN: Montey Hora Primary Care Shaianne Nucci: Myrtie Hawk Other Clinician: Referring Zaiden Ludlum: Myrtie Hawk Treating Raun Routh/Extender: Melburn Hake, HOYT Weeks in Treatment: 5 Edema Assessment Assessed: [Left: No] [Right: No] Edema: [Left: Ye] [Right: s] Calf Left: Right: Point of Measurement: 30 cm From Medial Instep 33 cm cm Ankle Left: Right: Point of Measurement: 10 cm From Medial Instep 25.5 cm cm Vascular Assessment Pulses: Dorsalis Pedis Palpable: [Left:Yes] Electronic Signature(s) Signed: 11/29/2018 5:10:54 PM By: Montey Hora Entered By: Montey Hora on 11/29/2018 14:26:56 [place], [name] ([id number]) -------------------------------------------------------------------------------- Multi Wound Chart Details Patient Name: [name], Valerya M. Date of Service: 11/29/2018 2:30 PM Medical Record Number: [id number] Patient Account Number:  [id number] Date of Birth/Sex: 1924-11-07 (85 y.o. F) Treating RN: Army Melia Primary Care Elmira Olkowski: Myrtie Hawk Other Clinician: Referring Savonna Birchmeier: Myrtie Hawk Treating Ianmichael Amescua/Extender: Melburn Hake, HOYT Weeks in Treatment: 5 Vital Signs Height(in): 60 Pulse(bpm): 77 Weight(lbs): 190 Blood Pressure(mmHg): 161/69 Body Mass Index(BMI): 37 Temperature(F): 99.0 Respiratory Rate 16 (breaths/min): Photos: [N/A:N/A] Wound Location: Left, Medial Lower Leg N/A N/A Wounding Event: Gradually Appeared N/A N/A Primary Etiology: Diabetic Wound/Ulcer of the N/A N/A Lower Extremity Comorbid History: Cataracts,  Glaucoma, N/A N/A Arrhythmia, Congestive Heart Failure, Coronary Artery Disease, Hypertension, Type II Diabetes, Gout, Osteoarthritis Date Acquired: 11/14/2018 N/A N/A Weeks of Treatment: 1 N/A N/A Wound Status: Healed - Epithelialized N/A N/A Measurements L x W x D 0x0x0 N/A N/A (cm) Area (cm) : 0 N/A N/A Volume (cm) : 0 N/A N/A % Reduction in Area: 100.00% N/A N/A % Reduction in Volume: 100.00% N/A N/A Classification: Grade 1 N/A N/A Exudate Amount: None Present N/A N/A Wound Margin: Flat and Intact N/A N/A Granulation Amount: None Present (0%) N/A N/A Necrotic Amount: None Present (0%) N/A N/A Exposed Structures: Fascia: No N/A N/A Fat Layer (Subcutaneous Tissue) Exposed: No Tendon: No Muscle: No Joint: No [name], Cassandra M. ([id number]) Bone: No Limited to Skin Breakdown Epithelialization: Large (67-100%) N/A N/A Treatment Notes Electronic Signature(s) Signed: 11/29/2018 5:01:07 PM By: Army Melia Entered By: Army Melia on 11/29/2018 14:45:00 [name], [name] ([id number]) -------------------------------------------------------------------------------- [place] Details Patient Name: [name], Tamre M. Date of Service: 11/29/2018 2:30 PM Medical Record Number: [id number] Patient Account Number:  [id number] Date of Birth/Sex: Oct 22, 1924 (85 y.o. F) Treating RN: Army Melia Primary Care Reef Achterberg: Myrtie Hawk Other Clinician: Referring Ceyda Peterka: Myrtie Hawk Treating Linken Mcglothen/Extender: Melburn Hake, HOYT Weeks in Treatment: 5 Active Inactive Electronic Signature(s) Signed: 11/29/2018 5:01:07 PM By: Army Melia Entered By: Army Melia on 11/29/2018 14:44:50 [name], [name] ([id number]) -------------------------------------------------------------------------------- Pain Assessment Details Patient Name: [name], Sharne M. Date of Service: 11/29/2018 2:30 PM Medical Record Number: [id number] Patient Account Number: [id number] Date of Birth/Sex: [birth year] (85 y.o. F) Treating RN: Montey Hora Primary Care Ambrea Hegler: Myrtie Hawk Other Clinician: Referring Hailea Eaglin: Myrtie Hawk Treating Sears Oran/Extender: Melburn Hake, HOYT Weeks in Treatment: 5 Active Problems Location of Pain Severity and Description of Pain Patient Has Paino No Site Locations Pain Management and Medication Current Pain Management: Electronic Signature(s) Signed: 11/29/2018 5:10:54 PM By: Montey Hora Entered By: Montey Hora on 11/29/2018 14:21:41 [name], [name] ([id number]) -------------------------------------------------------------------------------- Patient/Caregiver Education Details Patient Name: [name], Valkyrie M. Date of Service: 11/29/2018 2:30 PM Medical Record Number: [id number] Patient Account Number: [id number] Date of Birth/Gender: [birth year] (85 y.o. F) Treating RN: Army Melia Primary Care Physician: Myrtie Hawk Other Clinician: Referring Physician: Myrtie Hawk Treating Physician/Extender: Sharalyn Ink in Treatment: 5 Education Assessment Education Provided To: Patient Education Topics Provided Wound/Skin Impairment: Handouts: Caring for Your Ulcer Methods: Demonstration,  Explain/Verbal Responses: State content correctly Electronic Signature(s) Signed: 11/29/2018 5:01:07 PM By: Army Melia Entered By: Army Melia on 11/29/2018 14:48:13 [name], [name] ([id number]) -------------------------------------------------------------------------------- Wound Assessment Details Patient Name: [name], Geraldyn M. Date of Service: 11/29/2018 2:30 PM Medical Record Number: [id number] Patient Account Number: [id number] Date of Birth/Sex: 19-May-1924 (85 y.o. F) Treating RN: Army Melia Primary Care Ajiah Mcglinn: Myrtie Hawk Other Clinician: Referring Katrina Daddona: Myrtie Hawk Treating Suellyn Meenan/Extender: Melburn Hake, HOYT Weeks in Treatment: 5 Wound Status Wound Number: 6 Primary Diabetic Wound/Ulcer of the Lower Extremity Etiology: Wound Location: Left, Medial Lower Leg Wound Healed - Epithelialized Wounding Event: Gradually Appeared Status: Date Acquired: 11/14/2018 Comorbid Cataracts, Glaucoma, Arrhythmia, Congestive Weeks Of Treatment: 1 History: Heart Failure, Coronary Artery Disease, Clustered Wound: No Hypertension, Type II Diabetes, Gout, Osteoarthritis Photos Wound Measurements Length: (cm) 0 % Reductio Width: (cm) 0 % Reductio Depth: (cm) 0 Epithelial Area: (cm) 0 Tunneling Volume: (cm) 0 Undermini n in Area: 100% n in Volume: 100% ization: Large (67-100%) : No ng: No Wound Description Classification: Grade 1 Foul Odor Wound Margin: Flat and Intact  Slough/Fib Exudate Amount: None Present After Cleansing: No rino No Wound Bed Granulation Amount: None Present (0%) Exposed Structure Necrotic Amount: None Present (0%) Fascia Exposed: No Fat Layer (Subcutaneous Tissue) Exposed: No Tendon Exposed: No Muscle Exposed: No Joint Exposed: No Bone Exposed: No Limited to Skin Breakdown Electronic Signature(s) Cassandra ALEXZANDRA, BILTON ([id number]) Signed: 11/29/2018 5:01:07 PM By: Army Melia Entered By: Army Melia on 11/29/2018 14:44:39 [name], [name] ([id number]) -------------------------------------------------------------------------------- [place] Details Patient Name: [name], Dajon M. Date of Service: 11/29/2018 2:30 PM Medical Record Number: [id number] Patient Account Number: [id number] Date of Birth/Sex: May 08, 1924 (85 y.o. F) Treating RN: Montey Hora Primary Care Zuleyka Kloc: Myrtie Hawk Other Clinician: Referring Yanette Tripoli: Myrtie Hawk Treating Ijeoma Loor/Extender: Melburn Hake, HOYT Weeks in Treatment: 5 Vital Signs Time Taken: 14:21 Temperature (F): 99.0 Height (in): 60 Pulse (bpm): 77 Weight (lbs): 190 Respiratory Rate (breaths/min): 16 Body Mass Index (BMI): 37.1 Blood Pressure (mmHg): 161/69 Reference Range: 80 - 120 mg / dl Electronic Signature(s) Signed: 11/29/2018 5:10:54 PM By: Montey Hora Entered By: Montey Hora on 11/29/2018 14:25:57

## 2018-12-03 ENCOUNTER — Ambulatory Visit: Payer: PPO | Admitting: Podiatry

## 2018-12-03 ENCOUNTER — Other Ambulatory Visit: Payer: Self-pay

## 2018-12-03 ENCOUNTER — Encounter: Payer: Self-pay | Admitting: Podiatry

## 2018-12-03 DIAGNOSIS — M79675 Pain in left toe(s): Secondary | ICD-10-CM | POA: Diagnosis not present

## 2018-12-03 DIAGNOSIS — B351 Tinea unguium: Secondary | ICD-10-CM

## 2018-12-03 DIAGNOSIS — M79674 Pain in right toe(s): Secondary | ICD-10-CM | POA: Diagnosis not present

## 2018-12-03 DIAGNOSIS — D689 Coagulation defect, unspecified: Secondary | ICD-10-CM | POA: Diagnosis not present

## 2018-12-03 DIAGNOSIS — E1142 Type 2 diabetes mellitus with diabetic polyneuropathy: Secondary | ICD-10-CM

## 2018-12-03 NOTE — Progress Notes (Signed)
Complaint:  Visit Type: Patient returns to my office for continued preventative foot care services. Complaint: Patient states" my nails have grown long and thick and become painful to walk and wear shoes" Patient has been diagnosed with DM with neuropathy. The patient presents for preventative foot care services. No changes to ROS.  Patient is taking gabapentin.  Podiatric Exam: Vascular: dorsalis pedis  pulses are weakly palpable bilateral. PT pulses are not palpable  B/L. Capillary return is immediate. Temperature gradient is WNL. Skin turgor WNL  Sensorium: Normal Semmes Weinstein monofilament test. Normal tactile sensation bilaterally. Nail Exam: Pt has thick disfigured discolored nails with subungual debris noted bilateral entire nail hallux through fifth toenails Ulcer Exam: There is no evidence of ulcer or pre-ulcerative changes or infection. Orthopedic Exam: Muscle tone and strength are WNL. No limitations in general ROM. No crepitus or effusions noted. Foot type and digits show no abnormalities. HAV  B/L. Skin: No Porokeratosis. No infection or ulcers  Diagnosis:  Onychomycosis, , Pain in right toe, pain in left toes,  Diabetes with vascular disease. HAV  B/L.  Treatment & Plan Procedures and Treatment: Consent by patient was obtained for treatment procedures.   Debridement of mycotic and hypertrophic toenails, 1 through 5 bilateral and clearing of subungual debris. No ulceration, no infection noted. Return Visit-Office Procedure: Patient instructed to return to the office for a follow up visit 3 months for continued evaluation and treatment.    Laikynn Pollio DPM 

## 2018-12-04 ENCOUNTER — Ambulatory Visit (INDEPENDENT_AMBULATORY_CARE_PROVIDER_SITE_OTHER): Payer: PPO

## 2018-12-04 DIAGNOSIS — Z789 Other specified health status: Secondary | ICD-10-CM | POA: Diagnosis not present

## 2018-12-04 DIAGNOSIS — Z0000 Encounter for general adult medical examination without abnormal findings: Secondary | ICD-10-CM | POA: Diagnosis not present

## 2018-12-04 NOTE — Progress Notes (Signed)
Subjective:   [name] is a 85 y.o. female who presents for an Initial Medicare Annual Wellness Visit.  Review of Systems    No ROS.  Medicare Wellness Virtual Visit.  Visual/audio telehealth visit, UTA vital signs.   See social history for additional risk factors.    Cardiac Risk Factors include: advanced age (>59mn, >>49women);hypertension;diabetes mellitus     Objective:    Today's Vitals   There is no height or weight on file to calculate BMI.  Advanced Directives 12/04/2018 02/18/2018 04/02/2017 11/14/2016 10/26/2016 10/11/2016 08/29/2016  Does Patient Have a Medical Advance Directive? Yes No Yes Yes Yes Yes Yes  Type of [place]aramedicof [place] will - Healthcare Power of [place] Does patient want to make changes to medical advance directive? No - Patient declined - - No - Patient declined No - Patient declined - No - Patient declined  Copy of [place] Chart? Yes - validated most recent copy scanned in chart (See row information) - - Yes Yes Yes No - copy requested  Would patient like information on creating a medical advance directive? - No - Patient declined - - - - -    Current Medications (verified) Outpatient Encounter Medications as of 12/04/2018  Medication Sig  . ACCU-CHEK AVIVA PLUS test strip USE 1 STRIP TO TEST BLOOD SUGARS 3 TIMES A DAY  . acetaminophen (TYLENOL) 500 MG tablet Take 1 tablet (500 mg total) by mouth every 6 (six) hours as needed for mild pain or headache.  . allopurinol (ZYLOPRIM) 100 MG tablet Take 1 tablet (100 mg total) by mouth daily.  .Marland Kitchenaspirin 81 MG chewable tablet Chew 1 tablet (81 mg total) by mouth daily.  . blood glucose meter kit and supplies Dispense based on patient and insurance preference. Use 1x daily as directed. (FOR ICD-10 E10.9, E11.9).  . brimonidine  (ALPHAGAN P) 0.1 % SOLN Place 1 drop into the right eye 2 (two) times daily.  . Carboxymethylcellulose Sodium (ARTIFICIAL TEARS OP) Apply 1 drop to eye 2 (two) times daily. Left eye  . cholecalciferol (VITAMIN D) 25 MCG (1000 UT) tablet Take 1 tablet (1,000 Units total) by mouth daily.  . clotrimazole (LOTRIMIN) 1 % cream APPLY TOPICALLY TO THE AFFECTED AREA ONCE A DAY AT BEDTIME FOR 7-14 DAYS  . dimethicone (PROSHIELD PLUS SKIN PROTECTANT) 1 % cream 1 application to the buttock 2 times daily to help protect skin  . Dorzolamide HCl-Timolol Mal (COSOPT OP) Place 1 drop into the right eye 2 (two) times daily.   . feeding supplement, ENSURE ENLIVE, (ENSURE ENLIVE) LIQD Take 237 mLs by mouth 2 (two) times daily between meals.  . fluticasone (FLONASE) 50 MCG/ACT nasal spray Place 1 spray into both nostrils daily. Prn max 2 sprays total each nose  . gabapentin (NEURONTIN) 300 MG capsule Take 1 capsule (300 mg total) by mouth 2 (two) times daily.  .Marland KitchenglipiZIDE (GLUCOTROL) 5 MG tablet Take 1 tablet (5 mg total) by mouth 2 (two) times daily before a meal.  . glucose blood test strip Accu-Chek Aviva Plus test strips  . iron polysaccharides (NIFEREX) 150 MG capsule Take 1 capsule (150 mg total) by mouth daily. Note reduction in frequency from 2x to 1 x per day (Patient taking differently: Take 150 mg by mouth daily. )  . latanoprost (XALATAN) 0.005 % ophthalmic solution Place  1 drop into the right eye at bedtime.   Marland Kitchen lisinopril (ZESTRIL) 2.5 MG tablet TAKE 1 TABLET (2.5 MG TOTAL) BY MOUTH DAILY. HOLD FOR BLOOD PRESSURE LOWER THAN 90/60  . lovastatin (MEVACOR) 40 MG tablet Take 1 tablet (40 mg total) by mouth at bedtime.  . metoprolol succinate (TOPROL-XL) 25 MG 24 hr tablet Take 1 tablet (25 mg total) by mouth daily.  . Multiple Vitamin (MULTI-VITAMIN PO) Take 1 tablet by mouth daily.  . mupirocin ointment (BACTROBAN) 2 % Apply 1 application topically 2 (two) times daily. To open sores as needed  . mupirocin  ointment (BACTROBAN) 2 % Apply topically 2 (two) times daily.  . Nutritional Supplements (BOOST GLUCOSE CONTROL PO) Take 1 Container by mouth daily.  Glory Rosebush DELICA LANCETS 14N MISC USE TO TEST BLOOD SUGAR ONCE A DAY  . pantoprazole (PROTONIX) 40 MG tablet Take 1 tablet (40 mg total) by mouth daily. 30 minutes before breakfast OR dinner  . pilocarpine (PILOCAR) 4 % ophthalmic solution Place 1 drop into the right eye 4 (four) times daily.   . polyethylene glycol (MIRALAX) packet Take 17 g by mouth daily. prn  . Potassium Chloride ER 20 MEQ TBCR TAKE 1 TABLET BY MOUTH EVERY DAY WITH FLUID PILL  . Potassium Chloride ER 20 MEQ TBCR TAKE 1 TABLET BY MOUTH EVERY DAY WITH FLUID PILL  . senna-docusate (SENOKOT-S) 8.6-50 MG tablet Take 2 tablets by mouth daily as needed for mild constipation.   . sertraline (ZOLOFT) 50 MG tablet Take 1 tablet (50 mg total) by mouth daily.  . sitaGLIPtin (JANUVIA) 50 MG tablet Take 1 tablet (50 mg total) by mouth daily.  . traZODone (DESYREL) 100 MG tablet Take 0.5-1 tablets (50-100 mg total) by mouth at bedtime as needed.  . triamcinolone cream (KENALOG) 0.1 % Apply 1 application topically 2 (two) times daily. Prn legs  . vitamin C (ASCORBIC ACID) 500 MG tablet Take 1 tablet (500 mg total) by mouth daily.  . Zinc Oxide 10 % OINT Apply 1 application topically daily as needed (rash). Applied to patient's bottom  . torsemide (DEMADEX) 20 MG tablet Take 1.5 tablets (30 mg total) by mouth daily.   No facility-administered encounter medications on file as of 12/04/2018.     Allergies (verified) Metformin hcl, Metformin, and Metformin and related   History: Past Medical History:  Diagnosis Date  . Anemia   . Carotid arterial disease ([place])    a. 08/2014 U/S: Bilateral < 50% stenosis. Patent vertebrals w/ antegrade flow.   . Cervical spine arthritis   . Chronic diastolic CHF (congestive heart failure) ([place])    a. echo 2014: EF 55-60%, DD;  b. 08/2014 Echo: EF 55-60%, no  RWMA, GR1DD; c. 02/2016 Echo: EF 65-70%, Gr1 DD.  Marland Kitchen Cognitive impairment   . Depression   . Diabetes mellitus without complication ([place])   . Essential hypertension   . GERD (gastroesophageal reflux disease)   . Glaucoma   . Gout   . History of renal impairment   . Hyperlipidemia   . Hypotension    a. Related to Norvasc - 11/2014 ED visit.  . Multinodular goiter    a. Noted incidentally on CT 07/2012 and carotid U/S 08/2014;  b. Nl TSH 11/2014.  Marland Kitchen Paroxysmal SVT (supraventricular tachycardia) (H[facility])    a. on Toprol   . Prolapsed uterus   . Severe aortic stenosis    a. 11/15/16: s/p TAVR Edwards Sapien 3 THV (size 23 mm, model # 9600TFX, serial #  [id number])  . Stroke [facility])    a. CT head 07/2014 showed small thalamic infarct  . Vitamin deficiency    Past Surgical History:  Procedure Laterality Date  . EYE SURGERY     unsure of what procedure, did know that laser was used  . IR RADIOLOGY PERIPHERAL GUIDED IV START  11/03/2016  . IR US GUIDE VASC ACCESS RIGHT  11/03/2016  . RIGHT/LEFT HEART CATH AND CORONARY ANGIOGRAPHY N/A 10/26/2016   Procedure: RIGHT/LEFT HEART CATH AND CORONARY ANGIOGRAPHY;  Surgeon: [name], MD;  Location: [facility];  Service: Cardiovascular;  Laterality: N/A;  . TEE WITHOUT CARDIOVERSION N/A 03/31/2016   Procedure: TRANSESOPHAGEAL ECHOCARDIOGRAM (TEE);  Surgeon: Minna Merritts, MD;  Location: [facility];  Service: Cardiovascular;  Laterality: N/A;  . TEE WITHOUT CARDIOVERSION N/A 11/15/2016   Procedure: TRANSESOPHAGEAL ECHOCARDIOGRAM (TEE);  Surgeon: [name], MD;  Location: [facility];  Service: Open Heart Surgery;  Laterality: N/A;  . TRANSCATHETER AORTIC VALVE REPLACEMENT, TRANSFEMORAL N/A 11/15/2016   Procedure: TRANSCATHETER AORTIC VALVE REPLACEMENT, TRANSFEMORAL;  Surgeon: [name], MD;  Location: [facility];  Service: Open Heart Surgery;  Laterality: N/A;  . TUBAL LIGATION     Family History  Problem Relation Age of Onset  . Glaucoma Mother   .  Diabetes Mother   . Peptic Ulcer Father   . Colitis Father    Social History   Socioeconomic History  . Marital status: Widowed    Spouse name: Not on file  . Number of children: Not on file  . Years of education: Not on file  . Highest education level: Not on file  Occupational History  . Occupation: retired  Scientific laboratory technician  . Financial resource strain: Not hard at all  . Food insecurity    Worry: Never true    Inability: Never true  . Transportation needs    Medical: No    Non-medical: No  Tobacco Use  . Smoking status: Never Smoker  . Smokeless tobacco: Never Used  Substance and Sexual Activity  . Alcohol use: No  . Drug use: No  . Sexual activity: Not Currently  Lifestyle  . Physical activity    Days per week: 1 day    Minutes per session: 30 min  . Stress: Not at all  Relationships  . Social Herbalist on phone: Twice a week    Gets together: Twice a week    Attends religious service: 1 to 4 times per year    Active member of club or organization: No    Attends meetings of clubs or organizations: Never    Relationship status: Widowed  Other Topics Concern  . Not on file  Social History Narrative   Lives at home    11 kids     Tobacco Counseling Counseling given: Not Answered   Clinical Intake:  Pre-visit preparation completed: Yes        Diabetes: Yes(Followed by pcp)  How often do you need to have someone help you when you read instructions, pamphlets, or other written materials from your doctor or pharmacy?: 3 - Sometimes         Activities of Daily Living In your present state of health, do you have any difficulty performing the following activities: 12/04/2018 10/09/2018  Hearing? N N  Vision? N N  Difficulty concentrating or making decisions? N N  Walking or climbing stairs? Y Y  Comment Unsteady gait -  Dressing or bathing? Tempie Donning  Comment  Family assists -  Doing errands, shopping? Y Y  Comment She does not drive Scientist, product/process development and eating ? Y -  Comment Family prepares food. Self feeds. -  Using the Toilet? N -  In the past six months, have you accidently leaked urine? N -  Do you have problems with loss of bowel control? N -  Managing your Medications? Y -  Comment Family assist -  Managing your Finances? Y -  Comment Family assist -  Housekeeping or managing your Housekeeping? Y -  Comment Family assist -  Some recent data might be hidden     Immunizations and Health Maintenance Immunization History  Administered Date(s) Administered  . Influenza Inj Mdck Quad Pf 01/02/2017  . Influenza, High Dose Seasonal PF 12/14/2015, 11/30/2017  . Influenza, Seasonal, Injecte, Preservative Fre 01/01/2013, 01/22/2014  . Pneumococcal Conjugate-13 07/02/2014, 07/10/2014  . Pneumococcal Polysaccharide-23 06/28/2000, 08/27/2007  . Td 03/21/1996  . Zoster 07/11/2012   Health Maintenance Due  Topic Date Due  . OPHTHALMOLOGY EXAM  11/02/1934  . TETANUS/TDAP  03/21/2006    Patient Care Team: McLean-Scocuzza, Nino Glow, MD as PCP - General (Internal Medicine) Minna Merritts, MD as Consulting Physician (Cardiology) Alisa Graff, FNP as Nurse Practitioner (Family Medicine)  Indicate any recent Medical Services you may have received from other than [facility] providers in the past year (date may be approximate).     Assessment:   This is a routine wellness examination for [place].  I connected with patient 12/04/18 at  9:30 AM EDT by an audio enabled telemedicine application and verified that I am speaking with the correct person using two identifiers. Patient stated full name and DOB. Patient gave permission to continue with virtual visit. Information received from both daughter [name] (HIPAA compliant) and patient. Patient's location was at home and Nurse's location was at [place] office.   Health Maintenance Due: -Influenza vaccine 2020- discussed; to be completed in season with doctor or local  pharmacy.   -Tdap- discussed; to be completed with doctor in visit or local pharmacy.   -Eye Exam- she plans to schedule.  -Hgb A1c- 10/23/18 (10.3) Update all pending maintenance due as appropriate.   See completed HM at the end of note.   Eye: Visual acuity not assessed. Virtual visit. Wears corrective lenses. Followed by their ophthalmologist. Retinopathy- none reported.  Dental: Dentures- yes  Hearing: Demonstrates normal hearing during visit.  Safety:  Patient feels safe at home- yes Patient does have smoke detectors at home- yes Patient does wear sunscreen or protective clothing when in direct sunlight - yes Patient does wear seat belt when in a moving vehicle - yes Patient drives- no Adequate lighting in walkways free from debris- yes Grab bars and handrails used as appropriate- yes Ambulates with walker as an assistive device Lifeline/life alert/medic alert- patient request for home use  Social: Alcohol intake - no     Smoking history- never   Smokers in home? none Illicit drug use? none  Depression: PHQ 2 &9 complete. See screening below. Denies irritability, anhedonia, sadness/tearfullness.    Falls: See screening below.    Medication: Taking as directed and without issues.   Covid-19: Precautions and sickness symptoms discussed. Wears mask, social distancing, hand hygiene as appropriate.   Activities of Daily Living Patient needs assistance with: household chores, feeding themselves, getting from bed to chair, getting to the toilet, bathing/showering, dressing, managing money, or preparing meals. Ambulates with walker. Assisted by daughter with all  ADLs needed.   Memory: Patient is alert. Patient denies difficulty focusing or concentrating. Patient likes to read, scrapbook and listen to music for brain stimulation.  BMI- discussed the importance of a healthy diet, water intake and the benefits of aerobic exercise.  Educational material provided.   Physical activity- walking down the hall back and forth. Ankle/toe exercises.   Diet: Regular  Water: good intake  Other Providers Patient Care Team: McLean-Scocuzza, Nino Glow, MD as PCP - General (Internal Medicine) Minna Merritts, MD as Consulting Physician (Cardiology) Alisa Graff, FNP as Nurse Practitioner (Family Medicine)  Hearing/Vision screen  Hearing Screening   125Hz  250Hz  500Hz  1000Hz  2000Hz  3000Hz  4000Hz  6000Hz  8000Hz   Right ear:           Left ear:           Comments: Patient is able to hear conversational tones without difficulty.  No issues reported.  Vision Screening Comments: Visual acuity not assessed, virtual visit.       Dietary issues and exercise activities discussed: Current Exercise Habits: Home exercise routine, Type of exercise: walking(toe/foot/ankle stretches), Intensity: Mild  Goals      Patient Stated   . Increase physical activity (pt-stated)     Do more toe/ankle flexing      Depression Screen PHQ 2/9 Scores 12/04/2018 11/30/2017  PHQ - 2 Score 0 0    Fall Risk Fall Risk  12/04/2018 10/09/2018 04/16/2018 11/30/2017  Falls in the past year? 0 0 0 No  Number falls in past yr: - 0 - -  Injury with Fall? - 0 - -  Follow up - - Falls evaluation completed -   Timed Get Up and Go Performed no, virtual visit Cognitive Function:        Screening Tests Health Maintenance  Topic Date Due  . OPHTHALMOLOGY EXAM  11/02/1934  . TETANUS/TDAP  03/21/2006  . INFLUENZA VACCINE  05/29/2019 (Originally 09/29/2018)  . HEMOGLOBIN A1C  04/25/2019  . FOOT EXAM  05/21/2019  . DEXA SCAN  Completed  . PNA vac Low Risk Adult  Completed     Plan:   Keep all routine maintenance appointments.   Follow up 12/06/18   Lifeline referral placed per patient request to maintain safety and coordinate home care.  Medicare Attestation I have personally reviewed: The patient's medical and social history Their use of alcohol, tobacco or illicit drugs Their  current medications and supplements The patient's functional ability including ADLs,fall risks, home safety risks, cognitive, and hearing and visual impairment Diet and physical activities Evidence for depression   In addition, I have reviewed and discussed with patient certain preventive protocols, quality metrics, and best practice recommendations. A written personalized care plan for preventive services as well as general preventive health recommendations were provided to patient via mail.     Varney Biles, LPN   85/06/156

## 2018-12-04 NOTE — Patient Instructions (Addendum)
  Ms. [name] , Thank you for taking time to come for your Medicare Wellness Visit. I appreciate your ongoing commitment to your health goals. Please review the following plan we discussed and let me know if I can assist you in the future.   These are the goals we discussed: Goals      Patient Stated   . Increase physical activity (pt-stated)     Do more toe/ankle flexing       This is a list of the screening recommended for you and due dates:  Health Maintenance  Topic Date Due  . Eye exam for diabetics  11/02/1934  . Tetanus Vaccine  03/21/2006  . Flu Shot  05/29/2019*  . Hemoglobin A1C  04/25/2019  . Complete foot exam   05/21/2019  . DEXA scan (bone density measurement)  Completed  . Pneumonia vaccines  Completed  *Topic was postponed. The date shown is not the original due date.

## 2018-12-05 ENCOUNTER — Telehealth: Payer: Self-pay | Admitting: Internal Medicine

## 2018-12-05 NOTE — Telephone Encounter (Signed)
Does her daughter want me to order barium swallow test to evaluate he swallowing problems this is Xray which watches something go down her throat ?  [place]

## 2018-12-06 ENCOUNTER — Other Ambulatory Visit: Payer: Self-pay

## 2018-12-06 ENCOUNTER — Encounter: Payer: Self-pay | Admitting: Internal Medicine

## 2018-12-06 ENCOUNTER — Ambulatory Visit (INDEPENDENT_AMBULATORY_CARE_PROVIDER_SITE_OTHER): Payer: PPO | Admitting: Internal Medicine

## 2018-12-06 ENCOUNTER — Ambulatory Visit: Payer: PPO | Admitting: Physician Assistant

## 2018-12-06 VITALS — BP 124/76 | HR 79 | Temp 98.1°F | Ht 60.0 in | Wt 184.4 lb

## 2018-12-06 DIAGNOSIS — H409 Unspecified glaucoma: Secondary | ICD-10-CM

## 2018-12-06 DIAGNOSIS — E1122 Type 2 diabetes mellitus with diabetic chronic kidney disease: Secondary | ICD-10-CM

## 2018-12-06 DIAGNOSIS — I350 Nonrheumatic aortic (valve) stenosis: Secondary | ICD-10-CM

## 2018-12-06 DIAGNOSIS — D509 Iron deficiency anemia, unspecified: Secondary | ICD-10-CM

## 2018-12-06 DIAGNOSIS — R600 Localized edema: Secondary | ICD-10-CM | POA: Diagnosis not present

## 2018-12-06 DIAGNOSIS — Z23 Encounter for immunization: Secondary | ICD-10-CM | POA: Diagnosis not present

## 2018-12-06 DIAGNOSIS — Z1329 Encounter for screening for other suspected endocrine disorder: Secondary | ICD-10-CM

## 2018-12-06 DIAGNOSIS — I5032 Chronic diastolic (congestive) heart failure: Secondary | ICD-10-CM | POA: Diagnosis not present

## 2018-12-06 DIAGNOSIS — N184 Chronic kidney disease, stage 4 (severe): Secondary | ICD-10-CM

## 2018-12-06 DIAGNOSIS — B373 Candidiasis of vulva and vagina: Secondary | ICD-10-CM

## 2018-12-06 DIAGNOSIS — R1310 Dysphagia, unspecified: Secondary | ICD-10-CM

## 2018-12-06 DIAGNOSIS — B3731 Acute candidiasis of vulva and vagina: Secondary | ICD-10-CM

## 2018-12-06 DIAGNOSIS — Z0100 Encounter for examination of eyes and vision without abnormal findings: Secondary | ICD-10-CM

## 2018-12-06 DIAGNOSIS — H269 Unspecified cataract: Secondary | ICD-10-CM

## 2018-12-06 DIAGNOSIS — E119 Type 2 diabetes mellitus without complications: Secondary | ICD-10-CM

## 2018-12-06 DIAGNOSIS — D649 Anemia, unspecified: Secondary | ICD-10-CM

## 2018-12-06 MED ORDER — NOVAFERRUM 125 125-100 MG-UNT/5ML PO LIQD: 125.0000 mg | Freq: Every day | ORAL | 11 refills | Status: DC

## 2018-12-06 MED ORDER — FLUCONAZOLE 150 MG PO TABS: 150.0000 mg | ORAL_TABLET | Freq: Once | ORAL | 2 refills | Status: AC

## 2018-12-06 NOTE — Telephone Encounter (Signed)
Will discuss at appointment today.

## 2018-12-06 NOTE — Patient Instructions (Addendum)
<  140/90 blood pressure goal   goldbond antifungal powder without talc     Skin Yeast Infection  A skin yeast infection is a condition in which there is an overgrowth of yeast (candida) that normally lives on the skin. This condition usually occurs in areas of the skin that are constantly warm and moist, such as the armpits or the groin. What are the causes? This condition is caused by a change in the normal balance of the yeast and bacteria that live on the skin. What increases the risk? You are more likely to develop this condition if you:  Are obese.  Are pregnant.  Take birth control pills.  Have diabetes.  Take antibiotic medicines.  Take steroid medicines.  Are malnourished.  Have a weak body defense system (immune system).  Are 8 years of age or older.  Wear tight clothing. What are the signs or symptoms? The most common symptom of this condition is itchiness in the affected area. Other symptoms include:  Red, swollen area of the skin.  Bumps on the skin. How is this diagnosed?  This condition is diagnosed with a medical history and physical exam.  Your health care provider may check for yeast by taking light scrapings of the skin to be viewed under a microscope. How is this treated? This condition is treated with medicine. Medicines may be prescribed or be available over the counter. The medicines may be:  Taken by mouth (orally).  Applied as a cream or powder to your skin. Follow these instructions at home:   Take or apply over-the-counter and prescription medicines only as told by your health care provider.  Maintain a healthy weight. If you need help losing weight, talk with your health care provider.  Keep your skin clean and dry.  If you have diabetes, keep your blood sugar under control.  Keep all follow-up visits as told by your health care provider. This is important. Contact a health care provider if:  Your symptoms go away and then  return.  Your symptoms do not get better with treatment.  Your symptoms get worse.  Your rash spreads.  You have a fever or chills.  You have new symptoms.  You have new warmth or redness of your skin. Summary  A skin yeast infection is a condition in which there is an overgrowth of yeast (candida) that normally lives on the skin. This condition is caused by a change in the normal balance of the yeast and bacteria that live on the skin.  Take or apply over-the-counter and prescription medicines only as told by your health care provider.  Keep your skin clean and dry.  Contact a health care provider if your symptoms do not get better with treatment. This information is not intended to replace advice given to you by your health care provider. Make sure you discuss any questions you have with your health care provider. Document Released: 11/02/2010 Document Revised: 07/04/2017 Document Reviewed: 07/04/2017 Elsevier Patient Education  2020 [employer].

## 2018-12-06 NOTE — Progress Notes (Signed)
Chief Complaint  Patient presents with  . Follow-up   F/u with daughter  1. S/p TAVR for aortic stenosis in 09/2016 with CHF weight stable, no sob currently only with exertion, no chest pain, chronic leg edema+ w/o sig change  On meds f/u CHF clinic 12/2018 2. Dysphagia with pills and choking on gabapentin 300 mg bid and multivitamin and iron will have her open gabapentin and also change iron to liquid from pill Daughter declines barium swallow for now  No trouble with food other than meats, liquids ok   3. DM 2 uncontrolled A1C 10.3 on glipizide 5 mg bid and januvia 50 with improvement of cbgs from 260s-320s to 139 she likes hot cocoa, ice cream, cake and has been doing this  4. C/o vaginal itching using dove soap advised #3 can cause yeast infections    Review of Systems  Constitutional: Positive for weight loss.       Down 3 lbs    HENT: Positive for hearing loss.   Eyes: Negative for blurred vision.  Respiratory: Negative for shortness of breath.        +sob with exertion    Cardiovascular: Positive for leg swelling. Negative for chest pain.  Gastrointestinal: Negative for abdominal pain.  Genitourinary:       +vaginal itching   Skin: Negative for rash.  Neurological: Negative for dizziness.  Psychiatric/Behavioral: Negative for depression.   Past Medical History:  Diagnosis Date  . Anemia   . Carotid arterial disease ([place])    a. 08/2014 U/S: Bilateral < 50% stenosis. Patent vertebrals w/ antegrade flow.   . Cervical spine arthritis   . Chronic diastolic CHF (congestive heart failure) ([place])    a. echo 2014: EF 55-60%, DD;  b. 08/2014 Echo: EF 55-60%, no RWMA, GR1DD; c. 02/2016 Echo: EF 65-70%, Gr1 DD.  Marland Kitchen Cognitive impairment   . Depression   . Diabetes mellitus without complication ([place])   . Essential hypertension   . GERD (gastroesophageal reflux disease)   . Glaucoma   . Gout   . History of renal impairment   . Hyperlipidemia   . Hypotension    a. Related to  Norvasc - 11/2014 ED visit.  . Multinodular goiter    a. Noted incidentally on CT 07/2012 and carotid U/S 08/2014;  b. Nl TSH 11/2014.  Marland Kitchen Paroxysmal SVT (supraventricular tachycardia) (HCC)    a. on Toprol   . Prolapsed uterus   . Severe aortic stenosis    a. 11/15/16: s/p TAVR Edwards Sapien 3 THV (size 23 mm, model # 9600TFX, serial # [id number])  . Stroke [facility])    a. CT head 07/2014 showed small thalamic infarct  . Vitamin deficiency    Past Surgical History:  Procedure Laterality Date  . EYE SURGERY     unsure of what procedure, did know that laser was used  . IR RADIOLOGY PERIPHERAL GUIDED IV START  11/03/2016  . IR US GUIDE VASC ACCESS RIGHT  11/03/2016  . RIGHT/LEFT HEART CATH AND CORONARY ANGIOGRAPHY N/A 10/26/2016   Procedure: RIGHT/LEFT HEART CATH AND CORONARY ANGIOGRAPHY;  Surgeon: [name], MD;  Location: [facility];  Service: Cardiovascular;  Laterality: N/A;  . TEE WITHOUT CARDIOVERSION N/A 03/31/2016   Procedure: TRANSESOPHAGEAL ECHOCARDIOGRAM (TEE);  Surgeon: Minna Merritts, MD;  Location: [facility];  Service: Cardiovascular;  Laterality: N/A;  . TEE WITHOUT CARDIOVERSION N/A 11/15/2016   Procedure: TRANSESOPHAGEAL ECHOCARDIOGRAM (TEE);  Surgeon: [name], MD;  Location: [facility];  Service: Open  Heart Surgery;  Laterality: N/A;  . TRANSCATHETER AORTIC VALVE REPLACEMENT, TRANSFEMORAL N/A 11/15/2016   Procedure: TRANSCATHETER AORTIC VALVE REPLACEMENT, TRANSFEMORAL;  Surgeon: [name], MD;  Location: [facility];  Service: Open Heart Surgery;  Laterality: N/A;  . TUBAL LIGATION     Family History  Problem Relation Age of Onset  . Glaucoma Mother   . Diabetes Mother   . Peptic Ulcer Father   . Colitis Father    Social History   Socioeconomic History  . Marital status: Widowed    Spouse name: Not on file  . Number of children: Not on file  . Years of education: Not on file  . Highest education level: Not on file  Occupational History  . Occupation: retired   Scientific laboratory technician  . Financial resource strain: Not hard at all  . Food insecurity    Worry: Never true    Inability: Never true  . Transportation needs    Medical: No    Non-medical: No  Tobacco Use  . Smoking status: Never Smoker  . Smokeless tobacco: Never Used  Substance and Sexual Activity  . Alcohol use: No  . Drug use: No  . Sexual activity: Not Currently  Lifestyle  . Physical activity    Days per week: 1 day    Minutes per session: 30 min  . Stress: Not at all  Relationships  . Social Herbalist on phone: Twice a week    Gets together: Twice a week    Attends religious service: 1 to 4 times per year    Active member of club or organization: No    Attends meetings of clubs or organizations: Never    Relationship status: Widowed  . Intimate partner violence    Fear of current or ex partner: No    Emotionally abused: No    Physically abused: No    Forced sexual activity: No  Other Topics Concern  . Not on file  Social History Narrative   Lives at home    11 kids    Current Meds  Medication Sig  . ACCU-CHEK AVIVA PLUS test strip USE 1 STRIP TO TEST BLOOD SUGARS 3 TIMES A DAY  . acetaminophen (TYLENOL) 500 MG tablet Take 1 tablet (500 mg total) by mouth every 6 (six) hours as needed for mild pain or headache.  . allopurinol (ZYLOPRIM) 100 MG tablet Take 1 tablet (100 mg total) by mouth daily.  Marland Kitchen aspirin 81 MG chewable tablet Chew 1 tablet (81 mg total) by mouth daily.  . blood glucose meter kit and supplies Dispense based on patient and insurance preference. Use 1x daily as directed. (FOR ICD-10 E10.9, E11.9).  . brimonidine (ALPHAGAN P) 0.1 % SOLN Place 1 drop into the right eye 2 (two) times daily.  . Carboxymethylcellulose Sodium (ARTIFICIAL TEARS OP) Apply 1 drop to eye 2 (two) times daily. Left eye  . cholecalciferol (VITAMIN D) 25 MCG (1000 UT) tablet Take 1 tablet (1,000 Units total) by mouth daily.  . clotrimazole (LOTRIMIN) 1 % cream APPLY  TOPICALLY TO THE AFFECTED AREA ONCE A DAY AT BEDTIME FOR 7-14 DAYS  . dimethicone (PROSHIELD PLUS SKIN PROTECTANT) 1 % cream 1 application to the buttock 2 times daily to help protect skin  . Dorzolamide HCl-Timolol Mal (COSOPT OP) Place 1 drop into the right eye 2 (two) times daily.   . feeding supplement, ENSURE ENLIVE, (ENSURE ENLIVE) LIQD Take 237 mLs by mouth 2 (two) times daily between meals.  Marland Kitchen  fluticasone (FLONASE) 50 MCG/ACT nasal spray Place 1 spray into both nostrils daily. Prn max 2 sprays total each nose  . gabapentin (NEURONTIN) 300 MG capsule Take 1 capsule (300 mg total) by mouth 2 (two) times daily.  Marland Kitchen glipiZIDE (GLUCOTROL) 5 MG tablet Take 1 tablet (5 mg total) by mouth 2 (two) times daily before a meal.  . glucose blood test strip Accu-Chek Aviva Plus test strips  . latanoprost (XALATAN) 0.005 % ophthalmic solution Place 1 drop into the right eye at bedtime.   Marland Kitchen lisinopril (ZESTRIL) 2.5 MG tablet TAKE 1 TABLET (2.5 MG TOTAL) BY MOUTH DAILY. HOLD FOR BLOOD PRESSURE LOWER THAN 90/60  . lovastatin (MEVACOR) 40 MG tablet Take 1 tablet (40 mg total) by mouth at bedtime.  . metoprolol succinate (TOPROL-XL) 25 MG 24 hr tablet Take 1 tablet (25 mg total) by mouth daily.  . Multiple Vitamin (MULTI-VITAMIN PO) Take 1 tablet by mouth daily.  . mupirocin ointment (BACTROBAN) 2 % Apply 1 application topically 2 (two) times daily. To open sores as needed  . mupirocin ointment (BACTROBAN) 2 % Apply topically 2 (two) times daily.  . Nutritional Supplements (BOOST GLUCOSE CONTROL PO) Take 1 Container by mouth daily.  Glory Rosebush DELICA LANCETS 19F MISC USE TO TEST BLOOD SUGAR ONCE A DAY  . pantoprazole (PROTONIX) 40 MG tablet Take 1 tablet (40 mg total) by mouth daily. 30 minutes before breakfast OR dinner  . pilocarpine (PILOCAR) 4 % ophthalmic solution Place 1 drop into the right eye 4 (four) times daily.   . polyethylene glycol (MIRALAX) packet Take 17 g by mouth daily. prn  . Potassium  Chloride ER 20 MEQ TBCR TAKE 1 TABLET BY MOUTH EVERY DAY WITH FLUID PILL  . Potassium Chloride ER 20 MEQ TBCR TAKE 1 TABLET BY MOUTH EVERY DAY WITH FLUID PILL  . senna-docusate (SENOKOT-S) 8.6-50 MG tablet Take 2 tablets by mouth daily as needed for mild constipation.   . sertraline (ZOLOFT) 50 MG tablet Take 1 tablet (50 mg total) by mouth daily.  . sitaGLIPtin (JANUVIA) 50 MG tablet Take 1 tablet (50 mg total) by mouth daily.  . traZODone (DESYREL) 100 MG tablet Take 0.5-1 tablets (50-100 mg total) by mouth at bedtime as needed.  . triamcinolone cream (KENALOG) 0.1 % Apply 1 application topically 2 (two) times daily. Prn legs  . vitamin C (ASCORBIC ACID) 500 MG tablet Take 1 tablet (500 mg total) by mouth daily.  . Zinc Oxide 10 % OINT Apply 1 application topically daily as needed (rash). Applied to patient's bottom  . [DISCONTINUED] iron polysaccharides (NIFEREX) 150 MG capsule Take 1 capsule (150 mg total) by mouth daily. Note reduction in frequency from 2x to 1 x per day (Patient taking differently: Take 150 mg by mouth daily. )   Allergies  Allergen Reactions  . Metformin Hcl Other (See Comments)    GI upset  . Metformin Other (See Comments), Diarrhea and Nausea And Vomiting  . Metformin And Related Diarrhea   Recent Results (from the past 2160 hour(s))  Uric acid     Status: None   Collection Time: 10/23/18  9:47 AM  Result Value Ref Range   Uric Acid, Serum 6.2 2.4 - 7.0 mg/dL  Hemoglobin A1c     Status: Abnormal   Collection Time: 10/23/18  9:47 AM  Result Value Ref Range   Hgb A1c MFr Bld 10.3 (H) 4.6 - 6.5 %    Comment: Glycemic Control Guidelines for People with Diabetes:Non Diabetic:  <  6%Goal of Therapy: <7%Additional Action Suggested:  >8%   CBC with Differential/Platelet     Status: Abnormal   Collection Time: 10/23/18  9:47 AM  Result Value Ref Range   WBC 9.1 4.0 - 10.5 K/uL   RBC 3.55 (L) 3.87 - 5.11 Mil/uL   Hemoglobin 11.1 (L) 12.0 - 15.0 g/dL   HCT 33.2 (L) 36.0  - 46.0 %   MCV 93.5 78.0 - 100.0 fl   MCHC 33.5 30.0 - 36.0 g/dL   RDW 15.7 (H) 11.5 - 15.5 %   Platelets 163.0 150.0 - 400.0 K/uL   Neutrophils Relative % 75.6 43.0 - 77.0 %   Lymphocytes Relative 11.8 (L) 12.0 - 46.0 %   Monocytes Relative 6.7 3.0 - 12.0 %   Eosinophils Relative 5.2 (H) 0.0 - 5.0 %   Basophils Relative 0.7 0.0 - 3.0 %   Neutro Abs 6.9 1.4 - 7.7 K/uL   Lymphs Abs 1.1 0.7 - 4.0 K/uL   Monocytes Absolute 0.6 0.1 - 1.0 K/uL   Eosinophils Absolute 0.5 0.0 - 0.7 K/uL   Basophils Absolute 0.1 0.0 - 0.1 K/uL  Comprehensive metabolic panel     Status: Abnormal   Collection Time: 10/23/18  9:47 AM  Result Value Ref Range   Sodium 139 135 - 145 mEq/L   Potassium 3.9 3.5 - 5.1 mEq/L   Chloride 100 96 - 112 mEq/L   CO2 32 19 - 32 mEq/L   Glucose, Bld 188 (H) 70 - 99 mg/dL   BUN 17 6 - 23 mg/dL   Creatinine, Ser 1.20 0.40 - 1.20 mg/dL   Total Bilirubin 0.6 0.2 - 1.2 mg/dL   Alkaline Phosphatase 56 39 - 117 U/L   AST 20 0 - 37 U/L   ALT 12 0 - 35 U/L   Total Protein 6.7 6.0 - 8.3 g/dL   Albumin 3.8 3.5 - 5.2 g/dL   Calcium 9.3 8.4 - 10.5 mg/dL   GFR 50.84 (L) >60.00 mL/min   Objective  Body mass index is 36.01 kg/m. Wt Readings from Last 3 Encounters:  12/06/18 184 lb 6.4 oz (83.6 kg)  10/09/18 187 lb (84.8 kg)  08/23/18 192 lb (87.1 kg)   Temp Readings from Last 3 Encounters:  12/06/18 98.1 F (36.7 C) (Oral)  09/03/18 98.2 F (36.8 C)  05/21/18 98.1 F (36.7 C)   BP Readings from Last 3 Encounters:  12/06/18 124/76  10/09/18 (!) 142/58  08/23/18 (!) 154/89   Pulse Readings from Last 3 Encounters:  12/06/18 79  10/09/18 71  08/23/18 80    Physical Exam Vitals signs and nursing note reviewed.  Constitutional:      Appearance: Normal appearance. She is well-developed and well-groomed. She is obese.     Comments: +mask on    HENT:     Head: Normocephalic and atraumatic.  Eyes:     Conjunctiva/sclera: Conjunctivae normal.     Pupils: Pupils are  equal, round, and reactive to light.  Cardiovascular:     Rate and Rhythm: Normal rate and regular rhythm.     Heart sounds: Murmur present.     Comments: +2 leg edema chronic   Pulmonary:     Effort: Pulmonary effort is normal.     Breath sounds: Normal breath sounds.  Musculoskeletal:     Right lower leg: 2+ Edema present.     Left lower leg: 2+ Edema present.  Skin:    General: Skin is warm and dry.  Neurological:  General: No focal deficit present.     Mental Status: She is alert and oriented to person, place, and time. Mental status is at baseline.     Comments: In wheelchair    Psychiatric:        Attention and Perception: Attention and perception normal.        Mood and Affect: Mood and affect normal.        Speech: Speech normal.        Behavior: Behavior normal. Behavior is cooperative.        Thought Content: Thought content normal.        Cognition and Memory: Cognition and memory normal.        Judgment: Judgment normal.     Assessment  Plan  Dysphagia, unspecified type Empty gabapentin capsules 300 mg bid in applesauce Change iron pill to liquid  rec barium swallow if continues   Glaucoma, unspecified glaucoma type, unspecified laterality - Plan: Ambulatory referral to Ophthalmology Cataract, unspecified cataract type, unspecified laterality - Plan: Ambulatory referral to Ophthalmology Diabetic eye exam ([place]) - Plan: Ambulatory referral to Ophthalmology Check A1C at f/u   Iron deficiency anemia, unspecified iron deficiency anemia type - Plan: Polysacch Fe Complex-Vit D3 (NOVAFERRUM 125) 125-100 MG-UNT/5ML LIQD  Yeast vaginitis - Plan: fluconazole (DIFLUCAN) 150 MG tablet  Type 2 diabetes mellitus with stage 4 chronic kidney disease, without long-term current use of insulin (HCC) -cont meds   Chronic diastolic CHF (congestive heart failure) ([place]) Nonrheumatic aortic valve stenosis Lower extremity edema  -f/u cards 12/2018 -consider repeat echo  03/2019 if cards thinks needed   HM Flu shot given today  Consider pna 23 and Tdap in future  Rx shingrixin the past prevnar utd mmr immune  Pap 05/07/12 negative  Mammogram 04/27/16 negative  DEXA 12/18/06 osteopeniavitamin D 80.01 01/04/18 Colonoscopy 03/04/97 hyperplastic polyps no f/u  Provider: Dr. Olivia Mackie McLean-Scocuzza-Internal Medicine

## 2018-12-07 ENCOUNTER — Telehealth: Payer: Self-pay

## 2018-12-07 NOTE — Telephone Encounter (Signed)
Copied from [phone]. Topic: Referral - Status >> Dec 07, 2018 60:02 PM [name] D wrote: 98/05/7306 Spoke with patient's daughter Percell Locus regarding financial assistance for life alert system.  She is checking [employer] coverage and I am waiting for a return call from Stanford Scotland at [place] on Aging may have funds available through the [employer].  Ambrose Mantle [phone]

## 2018-12-14 ENCOUNTER — Telehealth: Payer: Self-pay

## 2018-12-14 NOTE — Telephone Encounter (Signed)
Copied from [place] [phone]. Topic: Referral - Status >> Dec 14, 2018  0:93 PM [name] D wrote: 26/71/2458 Spoke with patients daughter Percell Locus she plans to call [employer] in the next few days.  I have emailed  Stanford Scotland at [facility] on Aging after unanswered calls. Ambrose Mantle [phone]

## 2018-12-18 DIAGNOSIS — H401190 Primary open-angle glaucoma, unspecified eye, stage unspecified: Secondary | ICD-10-CM | POA: Diagnosis not present

## 2018-12-20 ENCOUNTER — Telehealth: Payer: Self-pay

## 2018-12-20 NOTE — Telephone Encounter (Signed)
Copied from [place] [phone]. Topic: Referral - Status >> Dec 20, 2018  5:10 PM [name] D wrote: 25/85/2778 Spoke with patient's daughter Percell Locus gave information for [place] Vicie Mutters, [place] for Personal Emergency Response System. Ambrose Mantle [phone]

## 2018-12-28 ENCOUNTER — Other Ambulatory Visit: Payer: Self-pay | Admitting: Internal Medicine

## 2018-12-28 ENCOUNTER — Telehealth: Payer: Self-pay | Admitting: Internal Medicine

## 2018-12-28 DIAGNOSIS — E1165 Type 2 diabetes mellitus with hyperglycemia: Secondary | ICD-10-CM

## 2018-12-28 MED ORDER — GLUCOSE BLOOD VI STRP: ORAL_STRIP | 12 refills | Status: DC

## 2018-12-28 MED ORDER — ONETOUCH ULTRASOFT LANCETS MISC: 12 refills | Status: AC

## 2018-12-28 NOTE — Telephone Encounter (Signed)
Pt's daughter crystal says that the needles needed are "the same size as the ones used for the meter last year"

## 2018-12-28 NOTE — Telephone Encounter (Signed)
Sent one touch ultra lancets and test strips   TMS

## 2018-12-28 NOTE — Telephone Encounter (Signed)
Call daughter and see which size needle needs and sch f/u appt   [place]

## 2018-12-28 NOTE — Telephone Encounter (Signed)
Left message for patient to return call back. [facility] may give results and obtain information.  

## 2018-12-28 NOTE — Telephone Encounter (Signed)
Copied from [place] [phone]. Topic: General - Other >> Dec 28, 2018  9:10 AM Keene Breath wrote: Reason for CRM: Patient's daughter called to ask the nurse about patient's test needle for her new monitor.  She needs a script for the needles and she would like to talk about scheduling a follow up appt.  Not sure if it should be in three months.  Please call daughter, Percell Locus, to discuss.  CB# [phone]

## 2018-12-31 NOTE — Progress Notes (Signed)
Patient ID: [name], female    DOB: 10/04/1924, 85 y.o.   MRN: [id number]  HPI  Ms [name] is a 85 y/o female with a history of Dm, hyperlipidemia, HTN, stroke, thyroid disease, GERD, anemia, SVT, severe aortic stenosis and chronic heart failure.   Echo report from 03/09/2018 reviewed and showed an EF of 50-55% along with mildly elevated PA pressure of 37m Hg.   Catheterization done 10/26/16 reviewed and showed: Patent coronary arteries with the exception of severe stenosis in the right PDA. There is mild nonobstructive stenosis of the LAD and RCA and a widely patent left main and left circumflex 2. Moderate to severe pulmonary hypertension with pulmonary artery pressure of 74/21 with a mean of 45 and a pulmonary capillary wedge pressure of 23 with a 32 mmHg T-wave 3. Mean transaortic valve gradient of 17 mmHg and calculated aortic valve area of 1.0 cm, suspect contamination of the pressure gradient with use of an AL-1 catheter and difficult measurement because of frequent ectopy  Has not been admitted or been in the ED in the last 6 months.   She presents today for a follow-up visit with a chief complaint of minimal shortness of breath on moderate exertion and fatigued easily. This is associated with chronic left knee pain. She denies chest pain, leg swelling, palpitations, abdominal distention, and dizziness. She sleeps with her legs elevated in a recliner. She weighs herself daily and her weight is stable. She takes between 311m40mg of torsemide daily (usually 30 every other day, alternated with 40 every other day). She wears compression socks and uses the lymphapress boots. She does not add salt to foods, however does like to eat pork once in a while which she knows has salt in it.    Past Medical History:  Diagnosis Date  . Anemia   . Carotid arterial disease ([facility]   a. 08/2014 U/S: Bilateral < 50% stenosis. Patent vertebrals w/ antegrade flow.   . Cervical spine  arthritis   . Chronic diastolic CHF (congestive heart failure) ([facility]   a. echo 2014: EF 55-60%, DD;  b. 08/2014 Echo: EF 55-60%, no RWMA, GR1DD; c. 02/2016 Echo: EF 65-70%, Gr1 DD.  . Marland Kitchenognitive impairment   . Depression   . Diabetes mellitus without complication ([facility] [place]  . Essential hypertension   . GERD (gastroesophageal reflux disease)   . Glaucoma   . Gout   . History of renal impairment   . Hyperlipidemia   . Hypotension    a. Related to Norvasc - 11/2014 ED visit.  . Multinodular goiter    a. Noted incidentally on CT 07/2012 and carotid U/S 08/2014;  b. Nl TSH 11/2014.  . Marland Kitchenaroxysmal SVT (supraventricular tachycardia) (HCC)    a. on Toprol   . Prolapsed uterus   . Severe aortic stenosis    a. 11/15/16: s/p TAVR Edwards Sapien 3 THV (size 23 mm, model # 9600TFX, serial # [id number] . [facility]   [facility] head 07/2014 showed small thalamic infarct  . Vitamin deficiency    Past Surgical History:  Procedure Laterality Date  . EYE SURGERY     unsure of what procedure, did know that laser was used  . IR RADIOLOGY PERIPHERAL GUIDED IV START  11/03/2016  . IR [place] VASC ACCESS RIGHT  11/03/2016  . RIGHT/LEFT HEART CATH AND CORONARY ANGIOGRAPHY N/A 10/26/2016   Procedure: RIGHT/LEFT HEART CATH AND CORONARY ANGIOGRAPHY;  Surgeon: CoSherren MochaMD;  Location:  [place]IVE [facility];  Service: Cardiovascular;  Laterality: N/A;  . TEE WITHOUT CARDIOVERSION N/A 03/31/2016   Procedure: TRANSESOPHAGEAL ECHOCARDIOGRAM (TEE);  Surgeon: Minna Merritts, MD;  Location: [facility] ORS;  Service: Cardiovascular;  Laterality: N/A;  . TEE WITHOUT CARDIOVERSION N/A 11/15/2016   Procedure: TRANSESOPHAGEAL ECHOCARDIOGRAM (TEE);  Surgeon: [name], MD;  Location: [place];  Service: Open Heart Surgery;  Laterality: N/A;  . TRANSCATHETER AORTIC VALVE REPLACEMENT, TRANSFEMORAL N/A 11/15/2016   Procedure: TRANSCATHETER AORTIC VALVE REPLACEMENT, TRANSFEMORAL;  Surgeon: [name], MD;  Location: [place];  Service:  Open Heart Surgery;  Laterality: N/A;  . TUBAL LIGATION     Family History  Problem Relation Age of Onset  . Glaucoma Mother   . Diabetes Mother   . Peptic Ulcer Father   . Colitis Father    Social History   Tobacco Use  . Smoking status: Never Smoker  . Smokeless tobacco: Never Used  Substance Use Topics  . Alcohol use: No   Allergies  Allergen Reactions  . Metformin Hcl Other (See Comments)    GI upset  . Metformin Other (See Comments), Diarrhea and Nausea And Vomiting  . Metformin And Related Diarrhea   Prior to Admission medications   Medication Sig Start Date End Date Taking? Authorizing Provider  ACCU-CHEK AVIVA PLUS test strip USE 1 STRIP TO TEST BLOOD SUGARS 3 TIMES A DAY 04/15/18  Yes McLean-Scocuzza, Nino Glow, MD  acetaminophen (TYLENOL) 500 MG tablet Take 1 tablet (500 mg total) by mouth every 6 (six) hours as needed for mild pain or headache. 04/15/18  Yes McLean-Scocuzza, Nino Glow, MD  allopurinol (ZYLOPRIM) 100 MG tablet Take 1 tablet (100 mg total) by mouth daily. 04/15/18  Yes McLean-Scocuzza, Nino Glow, MD  aspirin 81 MG chewable tablet Chew 1 tablet (81 mg total) by mouth daily. 04/15/18  Yes McLean-Scocuzza, Nino Glow, MD  blood glucose meter kit and supplies Dispense based on patient and insurance preference. Use 1x daily as directed. (FOR ICD-10 E10.9, E11.9). 11/30/17  Yes McLean-Scocuzza, Nino Glow, MD  brimonidine (ALPHAGAN P) 0.1 % SOLN Place 1 drop into the right eye 2 (two) times daily.   Yes [provider]  Carboxymethylcellulose Sodium (ARTIFICIAL TEARS OP) Apply 1 drop to eye 2 (two) times daily. Left eye   Yes [provider]  cholecalciferol (VITAMIN D) 25 MCG (1000 UT) tablet Take 1 tablet (1,000 Units total) by mouth daily. 04/15/18  Yes McLean-Scocuzza, Nino Glow, MD  clotrimazole (LOTRIMIN) 1 % cream APPLY TOPICALLY TO THE AFFECTED AREA ONCE A DAY AT BEDTIME FOR 7-14 DAYS 04/15/18  Yes McLean-Scocuzza, Nino Glow, MD  dimethicone (PROSHIELD PLUS  SKIN PROTECTANT) 1 % cream 1 application to the buttock 2 times daily to help protect skin 04/15/18  Yes McLean-Scocuzza, Nino Glow, MD  Dorzolamide HCl-Timolol Mal (COSOPT OP) Place 1 drop into the right eye 2 (two) times daily.    Yes [provider]  fluticasone (FLONASE) 50 MCG/ACT nasal spray Place 1 spray into both nostrils daily. Prn max 2 sprays total each nose 04/15/18  Yes McLean-Scocuzza, Nino Glow, MD  gabapentin (NEURONTIN) 300 MG capsule Take 1 capsule (300 mg total) by mouth 2 (two) times daily. 04/15/18  Yes McLean-Scocuzza, Nino Glow, MD  glipiZIDE (GLUCOTROL) 5 MG tablet Take 1 tablet (5 mg total) by mouth 2 (two) times daily before a meal. 11/01/18  Yes McLean-Scocuzza, Nino Glow, MD  glucose blood test strip Accu-Chek Aviva Plus test strips 04/15/18  Yes McLean-Scocuzza, Olivia Mackie  N, MD  glucose blood test strip Bid Use as instructed E11.9 12/28/18  Yes McLean-Scocuzza, Nino Glow, MD  Lancets [facility] ULTRASOFT) lancets Bid E 11.9 Use as instructed 12/28/18  Yes McLean-Scocuzza, Nino Glow, MD  latanoprost (XALATAN) 0.005 % ophthalmic solution Place 1 drop into the right eye at bedtime.    Yes [provider]  lisinopril (ZESTRIL) 2.5 MG tablet TAKE 1 TABLET (2.5 MG TOTAL) BY MOUTH DAILY. HOLD FOR BLOOD PRESSURE LOWER THAN 90/60 07/06/18  Yes [provider]  lovastatin (MEVACOR) 40 MG tablet Take 1 tablet (40 mg total) by mouth at bedtime. 04/15/18  Yes McLean-Scocuzza, Nino Glow, MD  metoprolol succinate (TOPROL-XL) 25 MG 24 hr tablet Take 1 tablet (25 mg total) by mouth daily. 04/15/18  Yes McLean-Scocuzza, Nino Glow, MD  Multiple Vitamin (MULTI-VITAMIN PO) Take 1 tablet by mouth daily.   Yes [provider]  mupirocin ointment (BACTROBAN) 2 % Apply topically 2 (two) times daily. 11/16/18  Yes McLean-Scocuzza, Nino Glow, MD  Nutritional Supplements (BOOST GLUCOSE CONTROL PO) Take 1 Container by mouth daily.   Yes [provider]  Jonetta Speak LANCETS 02V MISC USE  TO TEST BLOOD SUGAR ONCE A DAY 11/30/17  Yes [provider]  pantoprazole (PROTONIX) 40 MG tablet Take 1 tablet (40 mg total) by mouth daily. 30 minutes before breakfast OR dinner 04/15/18  Yes McLean-Scocuzza, Nino Glow, MD  pilocarpine (PILOCAR) 4 % ophthalmic solution Place 1 drop into the right eye 4 (four) times daily.    Yes [provider]  Polysacch Fe Complex-Vit D3 (NOVAFERRUM 125) 125-100 MG-UNT/5ML LIQD Take 125 mg by mouth daily. 12/06/18  Yes McLean-Scocuzza, Nino Glow, MD  Potassium Chloride ER 20 MEQ TBCR TAKE 1 TABLET BY MOUTH EVERY DAY WITH FLUID PILL 07/30/18  Yes [provider]  Potassium Chloride ER 20 MEQ TBCR TAKE 1 TABLET BY MOUTH EVERY DAY WITH FLUID PILL 10/25/18  Yes Gollan, Kathlene November, MD  senna-docusate (SENOKOT-S) 8.6-50 MG tablet Take 2 tablets by mouth daily as needed for mild constipation.    Yes [provider]  sertraline (ZOLOFT) 50 MG tablet Take 1 tablet (50 mg total) by mouth daily. 04/15/18  Yes McLean-Scocuzza, Nino Glow, MD  sitaGLIPtin (JANUVIA) 50 MG tablet Take 1 tablet (50 mg total) by mouth daily. 04/15/18  Yes McLean-Scocuzza, Nino Glow, MD  traZODone (DESYREL) 100 MG tablet Take 0.5-1 tablets (50-100 mg total) by mouth at bedtime as needed. 04/15/18  Yes McLean-Scocuzza, Nino Glow, MD  triamcinolone cream (KENALOG) 0.1 % Apply 1 application topically 2 (two) times daily. Prn legs 08/16/18  Yes McLean-Scocuzza, Nino Glow, MD  vitamin C (ASCORBIC ACID) 500 MG tablet Take 1 tablet (500 mg total) by mouth daily. 04/15/18  Yes McLean-Scocuzza, Nino Glow, MD  Zinc Oxide 10 % OINT Apply 1 application topically daily as needed (rash). Applied to patient's bottom 04/15/18  Yes McLean-Scocuzza, Nino Glow, MD  feeding supplement, ENSURE ENLIVE, (ENSURE ENLIVE) LIQD Take 237 mLs by mouth 2 (two) times daily between meals. Patient not taking: Reported on 01/02/2019 04/01/16   Fritzi Mandes, MD  mupirocin ointment (BACTROBAN) 2 % Apply 1 application topically 2  (two) times daily. To open sores as needed Patient not taking: Reported on 01/02/2019 07/26/18   McLean-Scocuzza, Nino Glow, MD  polyethylene glycol [facility]) packet Take 17 g by mouth daily. prn 04/15/18   McLean-Scocuzza, Nino Glow, MD  torsemide (DEMADEX) 20 MG tablet Take 1.5 tablets (30 mg total) by mouth daily. 06/19/18 10/09/18  Minna Merritts, MD    Review of Systems  Constitutional: Positive for fatigue (easily). Negative for appetite change.  HENT: Negative for congestion, postnasal drip and sore throat.   Eyes: Negative.   Respiratory: Positive for shortness of breath (minimal). Negative for chest tightness.   Cardiovascular: Negative for chest pain, palpitations and leg swelling.  Gastrointestinal: Negative for abdominal distention and abdominal pain.  Endocrine: Negative.   Genitourinary: Negative.   Musculoskeletal: Positive for arthralgias (left knee). Negative for back pain and neck pain.  Skin: Negative.   Allergic/Immunologic: Negative.   Neurological: Negative for dizziness and light-headedness.  Hematological: Negative for adenopathy. Does not bruise/bleed easily.  Psychiatric/Behavioral: Positive for sleep disturbance (sleeps in recliner). Negative for dysphoric mood. The patient is not nervous/anxious.    Vitals:   01/02/19 1051  BP: (!) 146/72  Pulse: 76  Resp: 16  SpO2: 93%   Filed Weights   01/02/19 1051  Weight: 189 lb (85.7 kg)   Lab Results  Component Value Date   CREATININE 1.20 10/23/2018   CREATININE 1.22 (H) 07/25/2018   CREATININE 1.26 (H) 05/25/2018   Physical Exam Vitals signs and nursing note reviewed.  Constitutional:      Appearance: Normal appearance.  HENT:     Head: Normocephalic and atraumatic.  Neck:     Musculoskeletal: Normal range of motion and neck supple.  Cardiovascular:     Rate and Rhythm: Normal rate and regular rhythm.  Pulmonary:     Effort: Pulmonary effort is normal. No respiratory distress.     Breath sounds:  Rhonchi (lower lobes) present. No wheezing or rales.  Abdominal:     General: There is no distension.     Palpations: Abdomen is soft.  Musculoskeletal:        General: No tenderness.     Right lower leg: Edema (1+ pitting) present.     Left lower leg: Edema (1+ pitting) present.  Skin:    General: Skin is warm and dry.  Neurological:     General: No focal deficit present.     Mental Status: She is alert and oriented to person, place, and time.  Psychiatric:        Mood and Affect: Mood normal.        Behavior: Behavior normal.    Assessment & Plan:  1: Chronic heart failure with preserved ejection fraction- - NYHA class III - euvolemic today - weighing daily and daughter was reminded to call for an overnight weight gain of >2 pounds or a weekly weight gain of >5 pounds - weight stable since last visit 3 months ago - not adding salt to her food except a "sprinkle" when eating seafood.  - limited in diuretic use due to renal function - continue current dose of torsemide 63m every other day alternating with 475mevery other day, #55, refills 6 sent to pharmacy.  - saw cardiology ([place]) 03/13/2018 - BNP 07/25/2018 was 95.0 - has received flu vaccine this season.  2: HTN- - BP looks good today - saw PCP (McLean-Scocuzza) 12/06/2018 - BMP from 10/23/2018 reviewed and showed sodium 139, potassium 3.9, creatinine 1.2 and GFR 50  3: DM- - A1c 10/23/2018 was 10.3% - patient checks blood sugar daily, unable to remember the number this morning.   4: Lymphedema- - stage 2 - saw vascular 08/23/2018 - elevating her legs "at times"; encouraged her to elevate her legs when sitting for long periods of time - has been wearing support socks daily -  limited in her ability to exercise due to her fatigue - has been wearing compression boots twice daily for the last week and family member says that her swelling improves after wearing them.   Patient did not bring medications nor a list. Each  medication was verbally reviewed with the patient and was encouraged to bring the bottles to every visit to confirm accuracy of the list.   Return in 6 months or sooner for any questions/problems before then.

## 2019-01-02 ENCOUNTER — Ambulatory Visit: Payer: PPO | Attending: Family | Admitting: Family

## 2019-01-02 ENCOUNTER — Encounter: Payer: Self-pay | Admitting: Family

## 2019-01-02 ENCOUNTER — Other Ambulatory Visit: Payer: Self-pay

## 2019-01-02 VITALS — BP 146/72 | HR 76 | Resp 16 | Ht 63.0 in | Wt 189.0 lb

## 2019-01-02 DIAGNOSIS — E1122 Type 2 diabetes mellitus with diabetic chronic kidney disease: Secondary | ICD-10-CM

## 2019-01-02 DIAGNOSIS — Z888 Allergy status to other drugs, medicaments and biological substances status: Secondary | ICD-10-CM | POA: Insufficient documentation

## 2019-01-02 DIAGNOSIS — I890 Lymphedema, not elsewhere classified: Secondary | ICD-10-CM | POA: Diagnosis not present

## 2019-01-02 DIAGNOSIS — Z79899 Other long term (current) drug therapy: Secondary | ICD-10-CM | POA: Insufficient documentation

## 2019-01-02 DIAGNOSIS — I509 Heart failure, unspecified: Secondary | ICD-10-CM | POA: Diagnosis present

## 2019-01-02 DIAGNOSIS — Z8379 Family history of other diseases of the digestive system: Secondary | ICD-10-CM | POA: Diagnosis not present

## 2019-01-02 DIAGNOSIS — F329 Major depressive disorder, single episode, unspecified: Secondary | ICD-10-CM | POA: Insufficient documentation

## 2019-01-02 DIAGNOSIS — K219 Gastro-esophageal reflux disease without esophagitis: Secondary | ICD-10-CM | POA: Insufficient documentation

## 2019-01-02 DIAGNOSIS — I110 Hypertensive heart disease with heart failure: Secondary | ICD-10-CM | POA: Insufficient documentation

## 2019-01-02 DIAGNOSIS — E785 Hyperlipidemia, unspecified: Secondary | ICD-10-CM | POA: Diagnosis not present

## 2019-01-02 DIAGNOSIS — M109 Gout, unspecified: Secondary | ICD-10-CM | POA: Diagnosis not present

## 2019-01-02 DIAGNOSIS — Z8249 Family history of ischemic heart disease and other diseases of the circulatory system: Secondary | ICD-10-CM | POA: Insufficient documentation

## 2019-01-02 DIAGNOSIS — E119 Type 2 diabetes mellitus without complications: Secondary | ICD-10-CM | POA: Diagnosis not present

## 2019-01-02 DIAGNOSIS — Z7982 Long term (current) use of aspirin: Secondary | ICD-10-CM | POA: Insufficient documentation

## 2019-01-02 DIAGNOSIS — I5032 Chronic diastolic (congestive) heart failure: Secondary | ICD-10-CM

## 2019-01-02 DIAGNOSIS — I10 Essential (primary) hypertension: Secondary | ICD-10-CM

## 2019-01-02 DIAGNOSIS — E079 Disorder of thyroid, unspecified: Secondary | ICD-10-CM | POA: Diagnosis not present

## 2019-01-02 DIAGNOSIS — Z8673 Personal history of transient ischemic attack (TIA), and cerebral infarction without residual deficits: Secondary | ICD-10-CM | POA: Insufficient documentation

## 2019-01-02 DIAGNOSIS — Z7984 Long term (current) use of oral hypoglycemic drugs: Secondary | ICD-10-CM | POA: Insufficient documentation

## 2019-01-02 DIAGNOSIS — G8929 Other chronic pain: Secondary | ICD-10-CM | POA: Insufficient documentation

## 2019-01-02 MED ORDER — TORSEMIDE 20 MG PO TABS: 20.0000 mg | ORAL_TABLET | Freq: Every day | ORAL | 6 refills | Status: DC

## 2019-01-02 NOTE — Patient Instructions (Signed)
Continue weighing daily and call for an overnight weight gain of > 2 pounds or a weekly weight gain of >5 pounds. 

## 2019-01-14 ENCOUNTER — Other Ambulatory Visit: Payer: Self-pay | Admitting: Internal Medicine

## 2019-01-14 ENCOUNTER — Telehealth: Payer: Self-pay | Admitting: *Deleted

## 2019-01-14 DIAGNOSIS — B373 Candidiasis of vulva and vagina: Secondary | ICD-10-CM

## 2019-01-14 DIAGNOSIS — B3731 Acute candidiasis of vulva and vagina: Secondary | ICD-10-CM

## 2019-01-14 MED ORDER — FLUCONAZOLE 150 MG PO TABS: 150.0000 mg | ORAL_TABLET | Freq: Once | ORAL | 1 refills | Status: AC

## 2019-01-14 NOTE — Telephone Encounter (Signed)
Copied from [place] [id number]. Topic: General - Other >> Jan 14, 2019 12:09 PM Antonieta Iba C wrote: Reason for CRM: pt's daughter is calling in to request a refill for fluconazole (DIFLUCAN) 150 MG tablet, daughter says that pt has some itching down below.   Pharmacy: [id number] - [place], [place] [phone] (Phone) [phone] (Fax)  CB: [phone]

## 2019-02-14 ENCOUNTER — Telehealth: Payer: Self-pay | Admitting: Internal Medicine

## 2019-02-14 NOTE — Telephone Encounter (Signed)
Patients daughter, Albina Billet would like Dr. Aundra Dubin to call her. Patient's feet have been smelling, dark spots with white heads for about 2 weeks. Her phone number is [phone].

## 2019-02-15 NOTE — Telephone Encounter (Signed)
Instructed patient daughter to contact her podiatrist, we do not have any appointments available until later next week. She stated she will call them.

## 2019-02-27 ENCOUNTER — Encounter: Payer: PPO | Attending: Internal Medicine | Admitting: Internal Medicine

## 2019-02-27 ENCOUNTER — Other Ambulatory Visit: Payer: Self-pay

## 2019-02-27 DIAGNOSIS — I872 Venous insufficiency (chronic) (peripheral): Secondary | ICD-10-CM | POA: Insufficient documentation

## 2019-02-27 DIAGNOSIS — Z8349 Family history of other endocrine, nutritional and metabolic diseases: Secondary | ICD-10-CM | POA: Insufficient documentation

## 2019-02-27 DIAGNOSIS — M1990 Unspecified osteoarthritis, unspecified site: Secondary | ICD-10-CM | POA: Insufficient documentation

## 2019-02-27 DIAGNOSIS — Z952 Presence of prosthetic heart valve: Secondary | ICD-10-CM | POA: Diagnosis not present

## 2019-02-27 DIAGNOSIS — M109 Gout, unspecified: Secondary | ICD-10-CM | POA: Diagnosis not present

## 2019-02-27 DIAGNOSIS — Z809 Family history of malignant neoplasm, unspecified: Secondary | ICD-10-CM | POA: Diagnosis not present

## 2019-02-27 DIAGNOSIS — E11622 Type 2 diabetes mellitus with other skin ulcer: Secondary | ICD-10-CM | POA: Insufficient documentation

## 2019-02-27 DIAGNOSIS — I110 Hypertensive heart disease with heart failure: Secondary | ICD-10-CM | POA: Insufficient documentation

## 2019-02-27 DIAGNOSIS — Z8249 Family history of ischemic heart disease and other diseases of the circulatory system: Secondary | ICD-10-CM | POA: Diagnosis not present

## 2019-02-27 DIAGNOSIS — L97222 Non-pressure chronic ulcer of left calf with fat layer exposed: Secondary | ICD-10-CM | POA: Diagnosis not present

## 2019-02-27 DIAGNOSIS — H42 Glaucoma in diseases classified elsewhere: Secondary | ICD-10-CM | POA: Diagnosis not present

## 2019-02-27 DIAGNOSIS — I2510 Atherosclerotic heart disease of native coronary artery without angina pectoris: Secondary | ICD-10-CM | POA: Diagnosis not present

## 2019-02-27 DIAGNOSIS — I509 Heart failure, unspecified: Secondary | ICD-10-CM | POA: Insufficient documentation

## 2019-02-27 DIAGNOSIS — L97221 Non-pressure chronic ulcer of left calf limited to breakdown of skin: Secondary | ICD-10-CM | POA: Diagnosis not present

## 2019-02-27 DIAGNOSIS — E1139 Type 2 diabetes mellitus with other diabetic ophthalmic complication: Secondary | ICD-10-CM | POA: Insufficient documentation

## 2019-02-27 DIAGNOSIS — Z841 Family history of disorders of kidney and ureter: Secondary | ICD-10-CM | POA: Insufficient documentation

## 2019-02-27 DIAGNOSIS — I890 Lymphedema, not elsewhere classified: Secondary | ICD-10-CM | POA: Diagnosis not present

## 2019-02-27 DIAGNOSIS — Z833 Family history of diabetes mellitus: Secondary | ICD-10-CM | POA: Diagnosis not present

## 2019-02-27 NOTE — Progress Notes (Signed)
[name] ([id number]) Visit Report for 02/27/2019 Abuse/Suicide Risk Screen Details Patient Name: [name], [name]. Date of Service: 02/27/2019 2:15 PM Medical Record Number: [id number] Patient Account Number: [id number] Date of Birth/Sex: 02-24-1925 (85 y.o. F) Treating RN: Army Melia Primary Care Dhriti Fales: Myrtie Hawk Other Clinician: Referring Anterio Scheel: Myrtie Hawk Treating Cabell Lazenby/Extender: Tito Dine in Treatment: 0 Abuse/Suicide Risk Screen Items Answer ABUSE RISK SCREEN: Has anyone close to you tried to hurt or harm you recentlyo No Do you feel uncomfortable with anyone in your familyo No Has anyone forced you do things that you didnot want to doo No Electronic Signature(s) Signed: 02/27/2019 2:59:41 PM By: Army Melia Entered By: Army Melia on 02/27/2019 14:32:51 [name], [name] ([id number]) -------------------------------------------------------------------------------- Activities of Daily Living Details Patient Name: [name], Rodney M. Date of Service: 02/27/2019 2:15 PM Medical Record Number: [id number] Patient Account Number: [id number] Date of Birth/Sex: 07-11-1924 (85 y.o. F) Treating RN: Army Melia Primary Care Dodd Schmid: Myrtie Hawk Other Clinician: Referring Jenavee Laguardia: Myrtie Hawk Treating Retaj Hilbun/Extender: Tito Dine in Treatment: 0 Activities of Daily Living Items Answer Activities of Daily Living (Please select one for each item) Drive Automobile Not Able Take Medications Completely Able Use Telephone Completely Able Care for Appearance Need Assistance Use Toilet Need Assistance Bath / Shower Need Assistance Dress Self Need Assistance Feed Self Need Assistance Walk Need Assistance Get In / Out Bed Need Assistance Housework Need Assistance Prepare Meals Need Assistance Handle Money Completely Able Shop for Self Completely  Able Electronic Signature(s) Signed: 02/27/2019 2:59:41 PM By: Army Melia Entered By: Army Melia on 02/27/2019 14:33:12 [name], [name] ([id number]) -------------------------------------------------------------------------------- Education Screening Details Patient Name: [name], Cassandra M. Date of Service: 02/27/2019 2:15 PM Medical Record Number: [id number] Patient Account Number: [id number] Date of Birth/Sex: 1924/07/02 (85 y.o. F) Treating RN: Army Melia Primary Care Faylynn Stamos: Myrtie Hawk Other Clinician: Referring Haunani Dickard: Myrtie Hawk Treating [place]/Extender: Tito Dine in Treatment: 0 Primary Learner Assessed: Patient Learning Preferences/Education Level/Primary Language Learning Preference: Explanation, Demonstration Highest Education Level: Grade School Preferred Language: English Cognitive Barrier Language Barrier: No Translator Needed: No Memory Deficit: No Emotional Barrier: No Cultural/Religious Beliefs Affecting Medical Care: No Physical Barrier Impaired Vision: No Impaired Hearing: No Decreased Hand dexterity: No Knowledge/Comprehension Knowledge Level: High Comprehension Level: High Ability to understand written High instructions: Ability to understand verbal High instructions: Motivation Anxiety Level: Calm Cooperation: Cooperative Education Importance: Acknowledges Need Interest in Health Problems: Asks Questions Perception: Coherent Willingness to Engage in Self- High Management Activities: Readiness to Engage in Self- High Management Activities: Electronic Signature(s) Signed: 02/27/2019 2:59:41 PM By: Army Melia Entered By: Army Melia on 02/27/2019 14:33:31 [name], [name] ([id number]) -------------------------------------------------------------------------------- Fall Risk Assessment Details Patient Name: [name], [name] Date of Service: 02/27/2019 2:15  PM Medical Record Number: [id number] Patient Account Number: [id number] Date of Birth/Sex: May 06, 1924 (85 y.o. F) Treating RN: Army Melia Primary Care Kalonji Zurawski: Myrtie Hawk Other Clinician: Referring Rishav Rockefeller: Myrtie Hawk Treating Dorean Hiebert/Extender: Tito Dine in Treatment: 0 Fall Risk Assessment Items Have you had 2 or more falls in the last 12 monthso 0 No Have you had any fall that resulted in injury in the last 12 monthso 0 No FALLS RISK SCREEN History of falling - immediate or within 3 months 0 No Secondary diagnosis (Do you have 2 or more medical diagnoseso) 0 No Ambulatory aid None/bed rest/wheelchair/nurse 0 No Crutches/cane/walker 0 No Furniture 0 No Intravenous therapy Access/Saline/Heparin Lock 0 No  Gait/Transferring Normal/ bed rest/ wheelchair 0 No Weak (short steps with or without shuffle, stooped but able to lift head while 10 Yes walking, may seek support from furniture) Impaired (short steps with shuffle, may have difficulty arising from chair, head 20 Yes down, impaired balance) Mental Status Oriented to own ability 0 No Electronic Signature(s) Signed: 02/27/2019 2:59:41 PM By: Army Melia Entered By: Army Melia on 02/27/2019 14:33:51 [name], [name] ([id number]) -------------------------------------------------------------------------------- Foot Assessment Details Patient Name: [name], Cassandra M. Date of Service: 02/27/2019 2:15 PM Medical Record Number: [id number] Patient Account Number: [id number] Date of Birth/Sex: 1924/11/21 (85 y.o. F) Treating RN: Army Melia Primary Care Samariyah Cowles: Myrtie Hawk Other Clinician: Referring Koron Godeaux: Myrtie Hawk Treating Paysen Goza/Extender: Tito Dine in Treatment: 0 Foot Assessment Items Site Locations + = Sensation present, - = Sensation absent, C = Callus, U = Ulcer R = Redness, W = Warmth, M = Maceration, PU = Pre-ulcerative  lesion F = Fissure, S = Swelling, D = Dryness Assessment Right: Left: Other Deformity: No No Prior Foot Ulcer: No No Prior Amputation: No No Charcot Joint: No No Ambulatory Status: Ambulatory With Help Assistance Device: Wheelchair Gait: Buyer, retail Signature(s) Signed: 02/27/2019 2:59:41 PM By: Army Melia Entered By: Army Melia on 02/27/2019 14:34:53 Magnolia, [name] ([id number]) -------------------------------------------------------------------------------- Nutrition Risk Screening Details Patient Name: [name], Loleta Date of Service: 02/27/2019 2:15 PM Medical Record Number: [id number] Patient Account Number: [id number] Date of Birth/Sex: Jul 10, 1924 (85 y.o. F) Treating RN: Army Melia Primary Care Morine Kohlman: Myrtie Hawk Other Clinician: Referring Ovid Witman: Myrtie Hawk Treating Janith Nielson/Extender: Ricard Dillon Weeks in Treatment: 0 Height (in): Weight (lbs): Body Mass Index (BMI): Nutrition Risk Screening Items Score Screening NUTRITION RISK SCREEN: I have an illness or condition that made me change the kind and/or amount of 0 No food I eat I eat fewer than two meals per day 0 No I eat few fruits and vegetables, or milk products 0 No I have three or more drinks of beer, liquor or wine almost every day 0 No I have tooth or mouth problems that make it hard for me to eat 0 No I don't always have enough money to buy the food I need 0 No I eat alone most of the time 0 No I take three or more different prescribed or over-the-counter drugs a day 0 No Without wanting to, I have lost or gained 10 pounds in the last six months 0 No I am not always physically able to shop, cook and/or feed myself 0 No Nutrition Protocols Good Risk Protocol 0 No interventions needed Moderate Risk Protocol High Risk Proctocol Risk Level: Good Risk Score: 0 Electronic Signature(s) Signed: 02/27/2019 2:59:41 PM By: Army Melia Entered  By: Army Melia on 02/27/2019 14:33:56

## 2019-02-28 NOTE — Progress Notes (Signed)
[name] ([id number]) Visit Report for 02/27/2019 Allergy List Details Patient Name: [name], [name]. Date of Service: 02/27/2019 2:15 PM Medical Record Number: [id number] Patient Account Number: [id number] Date of Birth/Sex: [birth year] (85 y.o. F) Treating RN: Army Melia Primary Care Lorrine Killilea: Myrtie Hawk Other Clinician: Referring Ivet Guerrieri: Myrtie Hawk Treating Braeden Dolinski/Extender: Ricard Dillon Weeks in Treatment: 0 Allergies Active Allergies metformin Reaction: nausea Severity: Moderate Allergy Notes Electronic Signature(s) Signed: 02/27/2019 2:59:41 PM By: Army Melia Entered By: Army Melia on 02/27/2019 14:30:26 [place], Simone Green ([id number]) -------------------------------------------------------------------------------- Arrival Information Details Patient Name: [name], Cassandra M. Date of Service: 02/27/2019 2:15 PM Medical Record Number: [id number] Patient Account Number: [id number] Date of Birth/Sex: June 23, 1924 (85 y.o. F) Treating RN: Army Melia Primary Care Hellena Pridgen: Myrtie Hawk Other Clinician: Referring Sadaf Przybysz: Myrtie Hawk Treating Daijon Wenke/Extender: Tito Dine in Treatment: 0 Visit Information Patient Arrived: Wheel Chair Arrival Time: 14:26 Accompanied By: daughter Transfer Assistance: None Patient Identification Verified: Yes History Since Last Visit Added or deleted any medications: No Any new allergies or adverse reactions: No Had a fall or experienced change in activities of daily living that may affect risk of falls: No Signs or symptoms of abuse/neglect since last visito No Hospitalized since last visit: No Has Dressing in Place as Prescribed: Yes Electronic Signature(s) Signed: 02/27/2019 2:59:41 PM By: Army Melia Entered By: Army Melia on 02/27/2019 14:26:49 [name]  ([id number]) -------------------------------------------------------------------------------- Clinic Level of Care Assessment Details Patient Name: [name], Cassandra M. Date of Service: 02/27/2019 2:15 PM Medical Record Number: [id number] Patient Account Number: [id number] Date of Birth/Sex: 1924/10/23 (85 y.o. F) Treating RN: Harold Barban Primary Care Melora Menon: Myrtie Hawk Other Clinician: Referring Carden Teel: Myrtie Hawk Treating Montravious Weigelt/Extender: Tito Dine in Treatment: 0 Clinic Level of Care Assessment Items TOOL 2 Quantity Score []  - Use when only an EandM is performed on the INITIAL visit 0 ASSESSMENTS - Nursing Assessment / Reassessment X - General Physical Exam (combine w/ comprehensive assessment (listed just below) when 1 20 performed on new pt. evals) X- 1 25 Comprehensive Assessment (HX, ROS, Risk Assessments, Wounds Hx, etc.) ASSESSMENTS - Wound and Skin Assessment / Reassessment X - Simple Wound Assessment / Reassessment - one wound 1 5 []  - 0 Complex Wound Assessment / Reassessment - multiple wounds []  - 0 Dermatologic / Skin Assessment (not related to wound area) ASSESSMENTS - Ostomy and/or Continence Assessment and Care []  - Incontinence Assessment and Management 0 []  - 0 Ostomy Care Assessment and Management (repouching, etc.) PROCESS - Coordination of Care X - Simple Patient / Family Education for ongoing care 1 15 []  - 0 Complex (extensive) Patient / Family Education for ongoing care []  - 0 Staff obtains Programmer, systems, Records, Test Results / Process Orders []  - 0 Staff telephones HHA, Nursing Homes / Clarify orders / etc []  - 0 Routine Transfer to another Facility (non-emergent condition) []  - 0 Routine Hospital Admission (non-emergent condition) []  - 0 New Admissions / Biomedical engineer / Ordering NPWT, Apligraf, etc. []  - 0 Emergency Hospital Admission (emergent condition) X- 1 10 Simple Discharge  Coordination []  - 0 Complex (extensive) Discharge Coordination PROCESS - Special Needs []  - Pediatric / Minor Patient Management 0 []  - 0 Isolation Patient Management Cassandra DANISA, KOPEC. ([id number]) []  - 0 Hearing / Language / Visual special needs []  - 0 Assessment of Community assistance (transportation, D/C planning, etc.) []  - 0 Additional assistance / Altered mentation []  - 0 Support Surface(s) Assessment (bed,  cushion, seat, etc.) INTERVENTIONS - Wound Cleansing / Measurement X - Wound Imaging (photographs - any number of wounds) 1 5 []  - 0 Wound Tracing (instead of photographs) X- 1 5 Simple Wound Measurement - one wound []  - 0 Complex Wound Measurement - multiple wounds X- 1 5 Simple Wound Cleansing - one wound []  - 0 Complex Wound Cleansing - multiple wounds INTERVENTIONS - Wound Dressings X - Small Wound Dressing one or multiple wounds 1 10 []  - 0 Medium Wound Dressing one or multiple wounds []  - 0 Large Wound Dressing one or multiple wounds []  - 0 Application of Medications - injection INTERVENTIONS - Miscellaneous []  - External ear exam 0 []  - 0 Specimen Collection (cultures, biopsies, blood, body fluids, etc.) []  - 0 Specimen(s) / Culture(s) sent or taken to Lab for analysis []  - 0 Patient Transfer (multiple staff / Civil Service fast streamer / Similar devices) []  - 0 Simple Staple / Suture removal (25 or less) []  - 0 Complex Staple / Suture removal (26 or more) []  - 0 Hypo / Hyperglycemic Management (close monitor of Blood Glucose) []  - 0 Ankle / Brachial Index (ABI) - do not check if billed separately Has the patient been seen at the hospital within the last three years: Yes Total Score: 100 Level Of Care: New/Established - Level 3 Electronic Signature(s) Signed: 02/27/2019 3:13:29 PM By: Harold Barban Entered By: Harold Barban on 02/27/2019 14:52:12 [name]  ([id number]) -------------------------------------------------------------------------------- Encounter Discharge Information Details Patient Name: [name], [name] Date of Service: 02/27/2019 2:15 PM Medical Record Number: [id number] Patient Account Number: [id number] Date of Birth/Sex: [birth year] (85 y.o. F) Treating RN: Harold Barban Primary Care Laurita Peron: Myrtie Hawk Other Clinician: Referring Karlen Barbar: Myrtie Hawk Treating Helem Reesor/Extender: Tito Dine in Treatment: 0 Encounter Discharge Information Items Discharge Condition: Stable Ambulatory Status: Wheelchair Discharge Destination: Home Transportation: Private Auto Accompanied By: daughter Schedule Follow-up Appointment: Yes Clinical Summary of Care: Electronic Signature(s) Signed: 02/27/2019 3:13:29 PM By: Harold Barban Entered By: Harold Barban on 02/27/2019 14:57:55 [name] ([id number]) -------------------------------------------------------------------------------- Lower Extremity Assessment Details Patient Name: [name], Faylinn M. Date of Service: 02/27/2019 2:15 PM Medical Record Number: [id number] Patient Account Number: [id number] Date of Birth/Sex: 02-07-1925 (85 y.o. F) Treating RN: Army Melia Primary Care Jaquann Guarisco: Myrtie Hawk Other Clinician: Referring Parys Elenbaas: Myrtie Hawk Treating Dyneshia Baccam/Extender: Ricard Dillon Weeks in Treatment: 0 Edema Assessment Assessed: [Left: No] [Right: No] Edema: [Left: Yes] [Right: Yes] Calf Left: Right: Point of Measurement: 31 cm From Medial Instep 39 cm 38.5 cm Ankle Left: Right: Point of Measurement: 11 cm From Medial Instep 28 cm 27 cm Vascular Assessment Pulses: Dorsalis Pedis Palpable: [Left:Yes] [Right:Yes] Electronic Signature(s) Signed: 02/27/2019 2:59:41 PM By: Army Melia Entered By: Army Melia on 02/27/2019 14:38:32 [place], Simone Green  ([id number]) -------------------------------------------------------------------------------- Multi Wound Chart Details Patient Name: [name], Davine M. Date of Service: 02/27/2019 2:15 PM Medical Record Number: [id number] Patient Account Number: [id number] Date of Birth/Sex: [birth year] (85 y.o. F) Treating RN: Harold Barban Primary Care Mohmmad Saleeby: Myrtie Hawk Other Clinician: Referring Casey Fye: Myrtie Hawk Treating Aldon Hengst/Extender: Tito Dine in Treatment: 0 Vital Signs Height(in): Pulse(bpm): 23 Weight(lbs): Blood Pressure(mmHg): 145/66 Body Mass Index(BMI): Temperature(F): 97.9 Respiratory Rate 16 (breaths/min): Photos: [N/A:N/A] Wound Location: Left Lower Leg - Lateral N/A N/A Wounding Event: Blister N/A N/A Primary Etiology: Venous Leg Ulcer N/A N/A Comorbid History: Cataracts, Glaucoma, N/A N/A Arrhythmia, Congestive Heart Failure, Coronary Artery Disease, Hypertension, Type II Diabetes, Gout, Osteoarthritis Date Acquired: 02/23/2019  N/A N/A Weeks of Treatment: 0 N/A N/A Wound Status: Open N/A N/A Measurements L x W x D 1.6x2x0.1 N/A N/A (cm) Area (cm) : 2.513 N/A N/A Volume (cm) : 0.251 N/A N/A Classification: Partial Thickness N/A N/A Exudate Amount: Medium N/A N/A Exudate Type: Serosanguineous N/A N/A Exudate Color: red, brown N/A N/A Granulation Amount: Large (67-100%) N/A N/A Granulation Quality: Red N/A N/A Necrotic Amount: Small (1-33%) N/A N/A Exposed Structures: Fat Layer (Subcutaneous N/A N/A Tissue) Exposed: Yes Fascia: No Tendon: No Muscle: No Joint: No Bone: No Cassandra SIMMONS, Makynzie M. ([id number]) Epithelialization: None N/A N/A Treatment Notes Wound #7 (Left, Lateral Lower Leg) Notes TCA, Silver alginate, ABD, 3-Layer bilaterally, unna to anchor Electronic Signature(s) Signed: 02/27/2019 4:48:37 PM By: Linton Ham MD Entered By: Linton Ham on 02/27/2019 15:05:18 Cassandra SIMMONS,  Simone Green ([id number]) -------------------------------------------------------------------------------- [facility] Details Patient Name: [facility] SIMMONS, Nisa M. Date of Service: 02/27/2019 2:15 PM Medical Record Number: [id number] Patient Account Number: [id number] Date of Birth/Sex: [birth year] (85 y.o. F) Treating RN: Harold Barban Primary Care Tony Granquist: Myrtie Hawk Other Clinician: Referring William Laske: Myrtie Hawk Treating Railey Glad/Extender: Tito Dine in Treatment: 0 Active Inactive Venous Leg Ulcer Nursing Diagnoses: Knowledge deficit related to disease process and management Goals: Patient/caregiver will verbalize understanding of disease process and disease management Date Initiated: 02/27/2019 Target Resolution Date: 02/27/2019 Goal Status: Active Interventions: Assess peripheral edema status every visit. Notes: Wound/Skin Impairment Nursing Diagnoses: Impaired tissue integrity Knowledge deficit related to ulceration/compromised skin integrity Goals: Ulcer/skin breakdown will have a volume reduction of 30% by week 4 Date Initiated: 02/27/2019 Target Resolution Date: 02/27/2019 Goal Status: Active Interventions: Assess patient/caregiver ability to obtain necessary supplies Assess patient/caregiver ability to perform ulcer/skin care regimen upon admission and as needed Assess ulceration(s) every visit Notes: Electronic Signature(s) Signed: 02/27/2019 3:13:29 PM By: Harold Barban Entered By: Harold Barban on 02/27/2019 14:50:14 [name] ([id number]) -------------------------------------------------------------------------------- Pain Assessment Details Patient Name: [name], Aayat M. Date of Service: 02/27/2019 2:15 PM Medical Record Number: [id number] Patient Account Number: [id number] Date of Birth/Sex: 30-Dec-1924 (85 y.o. F) Treating RN: Army Melia Primary Care Kreig Parson:  Myrtie Hawk Other Clinician: Referring Tanny Harnack: Myrtie Hawk Treating Wing Schoch/Extender: Ricard Dillon Weeks in Treatment: 0 Active Problems Location of Pain Severity and Description of Pain Patient Has Paino No Site Locations Pain Management and Medication Current Pain Management: Electronic Signature(s) Signed: 02/27/2019 2:59:41 PM By: Army Melia Entered By: Army Melia on 02/27/2019 14:27:02 [name] ([id number]) -------------------------------------------------------------------------------- Patient/Caregiver Education Details Patient Name: [name], Leani M. Date of Service: 02/27/2019 2:15 PM Medical Record Number: [id number] Patient Account Number: [id number] Date of Birth/Gender: 1925-02-23 (85 y.o. F) Treating RN: Harold Barban Primary Care Physician: Myrtie Hawk Other Clinician: Referring Physician: Myrtie Hawk Treating Physician/Extender: Tito Dine in Treatment: 0 Education Assessment Education Provided To: Patient Education Topics Provided Wound/Skin Impairment: Handouts: Caring for Your Ulcer Methods: Demonstration, Explain/Verbal Responses: State content correctly Electronic Signature(s) Signed: 02/27/2019 3:13:29 PM By: Harold Barban Entered By: Harold Barban on 02/27/2019 14:50:51 [place], Simone Green ([id number]) -------------------------------------------------------------------------------- Wound Assessment Details Patient Name: [name], Orchid M. Date of Service: 02/27/2019 2:15 PM Medical Record Number: [id number] Patient Account Number: [id number] Date of Birth/Sex: 29-Mar-1924 (85 y.o. F) Treating RN: Army Melia Primary Care Lleyton Byers: Myrtie Hawk Other Clinician: Referring Brieonna Crutcher: Myrtie Hawk Treating Quantavius Humm/Extender: Ricard Dillon Weeks in Treatment: 0 Wound Status Wound Number: 7 Primary Venous Leg  Ulcer Etiology: Wound Location: Left Lower Leg - Lateral Wound  Open Wounding Event: Blister Status: Date Acquired: 02/23/2019 Comorbid Cataracts, Glaucoma, Arrhythmia, Congestive Weeks Of Treatment: 0 History: Heart Failure, Coronary Artery Disease, Clustered Wound: No Hypertension, Type II Diabetes, Gout, Osteoarthritis Photos Wound Measurements Length: (cm) 1.6 % Reduction Width: (cm) 2 % Reduction Depth: (cm) 0.1 Epithelializ Area: (cm) 2.513 Tunneling: Volume: (cm) 0.251 Undermining in Area: in Volume: ation: None No : No Wound Description Classification: Partial Thickness Foul Odor Af Exudate Amount: Medium Slough/Fibri Exudate Type: Serosanguineous Exudate Color: red, brown ter Cleansing: No no Yes Wound Bed Granulation Amount: Large (67-100%) Exposed Structure Granulation Quality: Red Fascia Exposed: No Necrotic Amount: Small (1-33%) Fat Layer (Subcutaneous Tissue) Exposed: Yes Necrotic Quality: Adherent Slough Tendon Exposed: No Muscle Exposed: No Joint Exposed: No Bone Exposed: No Treatment Notes Cassandra SIMMONS, Ayn M. ([id number]) Wound #7 (Left, Lateral Lower Leg) Notes TCA, Silver alginate, ABD, 3-Layer bilaterally, unna to anchor Electronic Signature(s) Signed: 02/27/2019 2:59:41 PM By: Army Melia Entered By: Army Melia on 02/27/2019 14:36:28 [name] ([id number]) -------------------------------------------------------------------------------- Vitals Details Patient Name: [name], Birgitta M. Date of Service: 02/27/2019 2:15 PM Medical Record Number: [id number] Patient Account Number: [id number] Date of Birth/Sex: 03/09/1924 (85 y.o. F) Treating RN: Army Melia Primary Care Daisa Stennis: Myrtie Hawk Other Clinician: Referring Zsofia Prout: Myrtie Hawk Treating Danel Requena/Extender: Tito Dine in Treatment: 0 Vital Signs Time Taken: 14:27 Temperature (F): 97.9 Pulse (bpm):  78 Respiratory Rate (breaths/min): 16 Blood Pressure (mmHg): 145/66 Reference Range: 80 - 120 mg / dl Electronic Signature(s) Signed: 02/27/2019 2:59:41 PM By: Army Melia Entered By: Army Melia on 02/27/2019 14:27:28

## 2019-02-28 NOTE — Progress Notes (Signed)
[place] ([id number]) Visit Report for 02/27/2019 Chief Complaint Document Details Patient Name: [name], [name]. Date of Service: 02/27/2019 2:15 PM Medical Record Number: [id number] Patient Account Number: [id number] Date of Birth/Sex: November 21, 1924 (85 y.o. F) Treating RN: Primary Care Provider: Myrtie Hawk Other Clinician: Referring Provider: Myrtie Hawk Treating Provider/Extender: Tito Dine in Treatment: 0 Information Obtained from: Patient Chief Complaint Bilateral LE lymphedema with left leg ulcer 02/27/2019; patient returns to clinic with a wound on her left lateral upper calf Electronic Signature(s) Signed: 02/27/2019 4:48:37 PM By: Linton Ham MD Entered By: Linton Ham on 02/27/2019 15:06:28 [name], [name] ([id number]) -------------------------------------------------------------------------------- HPI Details Patient Name: [name], Cassandra M. Date of Service: 02/27/2019 2:15 PM Medical Record Number: [id number] Patient Account Number: [id number] Date of Birth/Sex: 12/25/1924 (85 y.o. F) Treating RN: Primary Care Provider: Myrtie Hawk Other Clinician: Referring Provider: Myrtie Hawk Treating Provider/Extender: Tito Dine in Treatment: 0 History of Present Illness HPI Description: 04/17/18 on evaluation today patient presents for initial inspection in our clinic concerning issues that she has been having with her gluteal region. Things actually appear to be doing much better at this point although we could go her daughter tells me that the patient was actually having areas that were draining in the gluteal region bilaterally. Fortunately these regions appear to have resolved. Still there is evidence of pressure injury to the gluteal region she has blanchable erythema but decreased capillary refill to the area which again is an early sign of pressure. Again I do  think that her daily activity may be the cause of such. She does have a history of diabetes with a hemoglobin A1c of 7.8 on 04/11/18. She also has gout and hypertension. The patient does get around in her home according to what her daughter tells me. However she spends most of her day and actually her night as well in her recliner which I think is likely where the pressure is coming from. She tells me that she doesn't sleep in a bed and pretty much unless she's getting up to use the bathroom or eat she is in her recliner. This again I think is likely the biggest issue that we're seeing right now for what's causing the pressure to her gluteal region. 08/02/18 on evaluation today patient actually presents for reevaluation in clinic concerning issues that she's been having with her gluteal region which the good news is appears to be completely healed at this time. There's no open wound currently. She did have bilateral lower extremity lymphedema which was starting to week more on the right lower extremity especially. With that being said this seems to be also doing better in resolving she's not wearing the compression stockings of I think she really is that compression stockings pretty much all the time. She does have TBI's that were performed and finalized on 04/30/18 they appear to be doing well with a right TBI of 0.70 in the left TBI of 0.98. Overall I feel like the patient is actually doing quite well although we do need to have some things that we go through with them as far as teaching is concerned in order to prevent issues from showing up in the future. No fevers, chills, nausea, or vomiting noted at this time. 10/19/2018 upon evaluation today patient presents for reevaluation our clinic concerning issues that she is actually having with her bilateral lower extremities. I have seen her a couple times previous where we have discussed a wound  on her gluteal region which actually was not giving her any  trouble technically the last time I saw her. She is always had issues with her legs and swelling she does have lymphedema pumps. In fact by some crazy coincidence she actually ended up having them ordered from 2 different locations all likely from the congestive heart failure clinic as well as vascular and ended up with 2 sets of lymphedema pumps. Nonetheless 1 of them she got covered by insurance the other they had to pay $700 for. Obviously I am not exactly sure what is going on in this regard but she obviously does not need to have 2 sets of pumps. Fortunately there are no signs of infection in regard to patient's bilateral lower extremities. She has been tolerating the dressing changes without complication. No fevers, chills, nausea, vomiting, or diarrhea. She was wearing compression stockings for some time until her leg started to weep. She was also using the lymphedema pumps according to her daughter who was present during the visit today until she started having issues with the weeping as well and then they were sure that she continue to use them. 10/26/2018 on evaluation today patient appears to be doing well with regard to her lower extremity ulcers. In fact we have actually gotten everything down to just 2 open areas and on the left this is just one very small region in fact. Overall I feel like she has done excellent in the short amount of time that we have been taking care of her legs and overall and very pleased in this regard. No fevers, chills, nausea, vomiting, or diarrhea. 11/02/18 on evaluation today patient appears to be doing excellent with regard to her bilateral lower extremities. She has been tolerating the dressing changes without complication. Fortunately she appears to be completely healed upon evaluation today. 11/22/2018 on evaluation today patient appears to be doing unfortunately somewhat worse compared to when I last saw her. She has a new blister that is open since I last  evaluated her unfortunately. The last time I did see her was actually on 9//20. Upon evaluation today she appears to have no signs of systemic infection which is good news there is also no signs of local infection I feel like this was simply more of a blister that came open. Her caregiver who is with her today states that when she is with her she has her use her lymphedema pumps twice a day. However she cannot speak to when others are caring for [facility] [name], [name]. ([id number]) her. She also states that she wears the Velcro compression wraps on a regular basis. 11/29/2018 on evaluation today patient appears to be doing excellent in regard to her left lower extremity ulcer which actually shown signs of being completely healed. This has done an excellent job as far as healing is concerned and I am very pleased in this regard [place] 02/27/2019 This is a 85 year old frail woman who is accompanied by her daughter. She has been in this clinic for 2 extended periods already this year initially early in the year for an area on her left gluteal and then more recently from 10/20/2018 through 11/29/2018 with wounds on her lower extremities. She has chronic venous insufficiency with secondary lymphedema. She has juxta lite stockings and external compression pumps at home which she apparently is reasonably compliant with. She does however sleep in a recliner with her legs dangling down all night. Her daughter tells [place] that she noted some increasing swelling  over the last week with a new wound on the left lateral calf which started as a blister. She is also concerned about blisters developing on the lateral part of her right calf Past medical history is reviewed; she has bilateral lower extremity lymphedema, type 2 diabetes, history of congestive heart failure, heart valve replacement 2 years ago. The patient had arterial studies in March 2020. Her ABIs were noncompressible however her TBI's were  normal bilaterally 0.7 on the right and 0.98 on the left Electronic Signature(s) Signed: 02/27/2019 4:48:37 PM By: Linton Ham MD Entered By: Linton Ham on 02/27/2019 15:09:20 [name], [name] ([id number]) -------------------------------------------------------------------------------- Physical Exam Details Patient Name: [name], Cassandra M. Date of Service: 02/27/2019 2:15 PM Medical Record Number: [id number] Patient Account Number: [id number] Date of Birth/Sex: 09-Nov-1924 (85 y.o. F) Treating RN: Primary Care Provider: Myrtie Hawk Other Clinician: Referring Provider: Myrtie Hawk Treating Provider/Extender: Tito Dine in Treatment: 0 Constitutional Patient is hypertensive.. Pulse regular and within target range for patient.Marland Kitchen Respirations regular, non-labored and within target range.. Temperature is normal and within the target range for the patient.Marland Kitchen appears in no distress. Frail elderly woman.Marland Kitchen Respiratory Respiratory effort is easy and symmetric bilaterally. Rate is normal at rest and on room air.. Bilateral breath sounds are clear and equal in all lobes with no wheezes, rales or rhonchi.. Cardiovascular Heart rhythm and rate regular, without murmur or gallop. JVP not elevated. Pedal pulses are palpable. Significant edema in her bilateral lower extremities. Integumentary (Hair, Skin) Severe bilateral hemosiderin deposition with lymphedema. Psychiatric No evidence of depression, anxiety, or agitation. Calm, cooperative, and communicative. Appropriate interactions and affect.. Notes Wound exam; the patient's only wound is a superficial wound about the size of a quarter on the left lateral calf. This appears to be healthy. No debridement is required. She however has far too much edema in her bilateral lower legs some of this almost appears pitting and I think it actually goes above her knees posteriorly. I could see no convincing  evidence of congestive heart failure at the bedside however. She has threatening blisters on the right lateral calf probably right medial as well. Electronic Signature(s) Signed: 02/27/2019 4:48:37 PM By: Linton Ham MD Entered By: Linton Ham on 02/27/2019 15:12:47 [place], [name] ([id number]) -------------------------------------------------------------------------------- Physician Orders Details Patient Name: [name], Cassandra M. Date of Service: 02/27/2019 2:15 PM Medical Record Number: [id number] Patient Account Number: [id number] Date of Birth/Sex: 12-22-1924 (85 y.o. F) Treating RN: Harold Barban Primary Care Provider: Myrtie Hawk Other Clinician: Referring Provider: Myrtie Hawk Treating Provider/Extender: Tito Dine in Treatment: 0 Verbal / Phone Orders: No Diagnosis Coding Wound Cleansing Wound #7 Left,Lateral Lower Leg o May shower with protection. Anesthetic (add to Medication List) Wound #7 Left,Lateral Lower Leg o Topical Lidocaine 4% cream applied to wound bed prior to debridement (In Clinic Only). Skin Barriers/Peri-Wound Care Wound #7 Left,Lateral Lower Leg o Triamcinolone Acetonide Ointment (TCA) - Bilateral Legs Primary Wound Dressing Wound #7 Left,Lateral Lower Leg o Silver Alginate Secondary Dressing Wound #7 Left,Lateral Lower Leg o ABD pad Dressing Change Frequency Wound #7 Left,Lateral Lower Leg o Change dressing every week Follow-up Appointments Wound #7 Left,Lateral Lower Leg o Return Appointment in 1 week. Edema Control Wound #7 Left,Lateral Lower Leg o 3 Layer Compression System - Bilateral Electronic Signature(s) Signed: 02/27/2019 3:13:29 PM By: Harold Barban Signed: 02/27/2019 4:48:37 PM By: Linton Ham MD Entered By: Harold Barban on 02/27/2019 14:56:11 [place]  ([id number]) -------------------------------------------------------------------------------- Problem List Details  Patient Name: AMORAH, SEBRING. Date of Service: 02/27/2019 2:15 PM Medical Record Number: [id number] Patient Account Number: [id number] Date of Birth/Sex: 21-Oct-1924 (85 y.o. 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 Date of Service: 02/27/2019 2:15 PM Medical Record Number: [id number] Patient Account Number: [id number] Date of Birth/Sex: 1924/07/21 (85 y.o. F) Treating RN: Primary Care Provider: Myrtie Hawk Other Clinician: Referring Provider: Myrtie Hawk Treating Provider/Extender: Tito Dine in Treatment: 0 Subjective Chief Complaint Information obtained from Patient Bilateral LE lymphedema with left leg ulcer 02/27/2019; patient returns to clinic with a wound on her left lateral upper calf History of Present Illness (HPI) 04/17/18 on evaluation today patient presents for initial inspection in our clinic  concerning issues that she has been having with her gluteal region. Things actually appear to be doing much better at this point although we could go her daughter tells me that the patient was actually having areas that were draining in the gluteal region bilaterally. Fortunately these regions appear to have resolved. Still there is evidence of pressure injury to the gluteal region she has blanchable erythema but decreased capillary refill to the area which again is an early sign of pressure. Again I do think that her daily activity may be the cause of such. She does have a history of diabetes with a hemoglobin A1c of 7.8 on 04/11/18. She also has gout and hypertension. The patient does get around in her home according to what her daughter tells me. However she spends most of her day and actually her night as well in her recliner which I think is likely where the pressure is coming from. She tells me that she doesn't sleep in a bed and pretty much unless she's getting up to use the bathroom or eat she is in her recliner. This again I think is likely the biggest issue that we're seeing right now for what's causing the pressure to her gluteal region. 08/02/18 on evaluation today patient actually presents for reevaluation in clinic concerning issues that she's been having with her gluteal region which the good news is appears to be completely healed at this time. There's no open wound currently. She did have bilateral lower extremity lymphedema which was starting to week more on the right lower extremity especially. With that being said this seems to be also doing better in resolving she's not wearing the compression stockings of I think she really is that compression stockings pretty much all the time. She does have TBI's that were performed and finalized on 04/30/18 they appear to be doing well with a right TBI of 0.70 in the left TBI of 0.98. Overall I feel like the patient is actually doing quite well  although we do need to have some things that we go through with them as far as teaching is concerned in order to prevent  issues from showing up in the future. No fevers, chills, nausea, or vomiting noted at this time. 10/19/2018 upon evaluation today patient presents for reevaluation our clinic concerning issues that she is actually having with her bilateral lower extremities. I have seen her a couple times previous where we have discussed a wound on her gluteal region which actually was not giving her any trouble technically the last time I saw her. She is always had issues with her legs and swelling she does have lymphedema pumps. In fact by some crazy coincidence she actually ended up having them ordered from 2 different locations all likely from the congestive heart failure clinic as well as vascular and ended up with 2 sets of lymphedema pumps. Nonetheless 1 of them she got covered by insurance the other they had to pay $700 for. Obviously I am not exactly sure what is going on in this regard but she obviously does not need to have 2 sets of pumps. Fortunately there are no signs of infection in regard to patient's bilateral lower extremities. She has been tolerating the dressing changes without complication. No fevers, chills, nausea, vomiting, or diarrhea. She was wearing compression stockings for some time until her leg started to weep. She was also using the lymphedema pumps according to her daughter who was present during the visit today until she started having issues with the weeping as well and then they were sure that she continue to use them. 10/26/2018 on evaluation today patient appears to be doing well with regard to her lower extremity ulcers. In fact we have actually gotten everything down to just 2 open areas and on the left this is just one very small region in fact. Overall I feel like she has done excellent in the short amount of time that we have been taking care of her legs and  overall and very pleased in this regard. No fevers, chills, nausea, vomiting, or diarrhea. 11/02/18 on evaluation today patient appears to be doing excellent with regard to her bilateral lower extremities. She has been tolerating the dressing changes without complication. Fortunately she appears to be completely healed upon evaluation today. [place] ([id number]) 11/22/2018 on evaluation today patient appears to be doing unfortunately somewhat worse compared to when I last saw her. She has a new blister that is open since I last evaluated her unfortunately. The last time I did see her was actually on 9//20. Upon evaluation today she appears to have no signs of systemic infection which is good news there is also no signs of local infection I feel like this was simply more of a blister that came open. Her caregiver who is with her today states that when she is with her she has her use her lymphedema pumps twice a day. However she cannot speak to when others are caring for her. She also states that she wears the Velcro compression wraps on a regular basis. 11/29/2018 on evaluation today patient appears to be doing excellent in regard to her left lower extremity ulcer which actually shown signs of being completely healed. This has done an excellent job as far as healing is concerned and I am very pleased in this regard [place] 02/27/2019 This is a 85 year old frail woman who is accompanied by her daughter. She has been in this clinic for 2 extended periods already this year initially early in the year for an area on her left gluteal and then more recently from 10/20/2018 through 11/29/2018 with wounds on her  lower extremities. She has chronic venous insufficiency with secondary lymphedema. She has juxta lite stockings and external compression pumps at home which she apparently is reasonably compliant with. She does however sleep in a recliner with her legs dangling down all  night. Her daughter tells [place] that she noted some increasing swelling over the last week with a new wound on the left lateral calf which started as a blister. She is also concerned about blisters developing on the lateral part of her right calf Past medical history is reviewed; she has bilateral lower extremity lymphedema, type 2 diabetes, history of congestive heart failure, heart valve replacement 2 years ago. The patient had arterial studies in March 2020. Her ABIs were noncompressible however her TBI's were normal bilaterally 0.7 on the right and 0.98 on the left Patient History Information obtained from Patient. Allergies metformin (Severity: Moderate, Reaction: nausea) Family History Cancer - Child, Diabetes - Siblings, Heart Disease - Father,Child, Hypertension - Child, Thyroid Problems - Siblings, No family history of Kidney Disease, Lung Disease, Seizures, Tuberculosis. Social History Never smoker, Marital Status - Widowed, Alcohol Use - Never, Drug Use - No History, Caffeine Use - Moderate. Medical History Eyes Patient has history of Cataracts - removed, Glaucoma Denies history of Optic Neuritis Ear/Nose/Mouth/Throat Denies history of Chronic sinus problems/congestion, Middle ear problems Hematologic/Lymphatic Denies history of Anemia, Hemophilia, Human Immunodeficiency Virus, Lymphedema, Sickle Cell Disease Respiratory Denies history of Aspiration, Asthma, Chronic Obstructive Pulmonary Disease (COPD), Pneumothorax, Sleep Apnea, Tuberculosis Cardiovascular Patient has history of Arrhythmia, Congestive Heart Failure, Coronary Artery Disease, Hypertension Denies history of Angina, Deep Vein Thrombosis, Hypotension, Myocardial Infarction, Peripheral Arterial Disease, Peripheral Venous Disease, Phlebitis, Vasculitis Gastrointestinal [name], Jelissa M. ([id number]) Denies history of Cirrhosis , Colitis, Crohn s, Hepatitis A, Hepatitis B, Hepatitis C Endocrine Patient has  history of Type II Diabetes Denies history of Type I Diabetes Genitourinary Denies history of End Stage Renal Disease Immunological Denies history of Lupus Erythematosus, Raynaud s, Scleroderma Integumentary (Skin) Denies history of History of Burn, History of pressure wounds Musculoskeletal Patient has history of Gout, Osteoarthritis - bilateral knees Denies history of Rheumatoid Arthritis, Osteomyelitis Neurologic Denies history of Dementia, Neuropathy, Quadriplegia, Paraplegia, Seizure Disorder Oncologic Denies history of Received Chemotherapy, Received Radiation Psychiatric Denies history of Anorexia/bulimia, Confinement Anxiety Review of Systems (ROS) Constitutional Symptoms (General Health) Denies complaints or symptoms of Fatigue, Fever, Chills, Marked Weight Change. Eyes Denies complaints or symptoms of Dry Eyes, Vision Changes, Glasses / Contacts. Ear/Nose/Mouth/Throat Denies complaints or symptoms of Difficult clearing ears, Sinusitis. Hematologic/Lymphatic Denies complaints or symptoms of Bleeding / Clotting Disorders, Human Immunodeficiency Virus. Respiratory Denies complaints or symptoms of Chronic or frequent coughs, Shortness of Breath. Cardiovascular Denies complaints or symptoms of Chest pain, LE edema. Gastrointestinal Denies complaints or symptoms of Frequent diarrhea, Nausea, Vomiting. Endocrine Denies complaints or symptoms of Hepatitis, Thyroid disease, Polydypsia (Excessive Thirst). Genitourinary Denies complaints or symptoms of Kidney failure/ Dialysis, Incontinence/dribbling. Immunological Denies complaints or symptoms of Hives, Itching. Integumentary (Skin) Complains or has symptoms of Wounds. Denies complaints or symptoms of Bleeding or bruising tendency, Breakdown, Swelling. Musculoskeletal Denies complaints or symptoms of Muscle Pain, Muscle Weakness. Neurologic Denies complaints or symptoms of Numbness/parasthesias,  Focal/Weakness. Psychiatric Denies complaints or symptoms of Anxiety, Claustrophobia. [place] ([id number]) Objective Constitutional Patient is hypertensive.. Pulse regular and within target range for patient.Marland Kitchen Respirations regular, non-labored and within target range.. Temperature is normal and within the target range for the patient.Marland Kitchen appears in no distress. Frail elderly woman.. Vitals Time Taken:  2:27 PM, Temperature: 97.9 F, Pulse: 78 bpm, Respiratory Rate: 16 breaths/min, Blood Pressure: 145/66 mmHg. Respiratory Respiratory effort is easy and symmetric bilaterally. Rate is normal at rest and on room air.. Bilateral breath sounds are clear and equal in all lobes with no wheezes, rales or rhonchi.. Cardiovascular Heart rhythm and rate regular, without murmur or gallop. JVP not elevated. Pedal pulses are palpable. Significant edema in her bilateral lower extremities. Psychiatric No evidence of depression, anxiety, or agitation. Calm, cooperative, and communicative. Appropriate interactions and affect.. General Notes: Wound exam; the patient's only wound is a superficial wound about the size of a quarter on the left lateral calf. This appears to be healthy. No debridement is required. She however has far too much edema in her bilateral lower legs some of this almost appears pitting and I think it actually goes above her knees posteriorly. I could see no convincing evidence of congestive heart failure at the bedside however. She has threatening blisters on the right lateral calf probably right medial as well. Integumentary (Hair, Skin) Severe bilateral hemosiderin deposition with lymphedema. Wound #7 status is Open. Original cause of wound was Blister. The wound is located on the Left,Lateral Lower Leg. The wound measures 1.6cm length x 2cm width x 0.1cm depth; 2.513cm^2 area and 0.251cm^3 volume. There is Fat Layer (Subcutaneous Tissue) Exposed exposed. There is no  tunneling or undermining noted. There is a medium amount of serosanguineous drainage noted. There is large (67-100%) red granulation within the wound bed. There is a small (1-33%) amount of necrotic tissue within the wound bed including Adherent Slough. Assessment Active Problems ICD-10 Chronic venous hypertension (idiopathic) with inflammation of bilateral lower extremity Non-pressure chronic ulcer of left calf limited to breakdown of skin Lymphedema, not elsewhere classified Type 2 diabetes mellitus with other skin ulcer Plan [name], TUOHY. ([id number]) Wound Cleansing: Wound #7 Left,Lateral Lower Leg: May shower with protection. Anesthetic (add to Medication List): Wound #7 Left,Lateral Lower Leg: Topical Lidocaine 4% cream applied to wound bed prior to debridement (In Clinic Only). Skin Barriers/Peri-Wound Care: Wound #7 Left,Lateral Lower Leg: Triamcinolone Acetonide Ointment (TCA) - Bilateral Legs Primary Wound Dressing: Wound #7 Left,Lateral Lower Leg: Silver Alginate Secondary Dressing: Wound #7 Left,Lateral Lower Leg: ABD pad Dressing Change Frequency: Wound #7 Left,Lateral Lower Leg: Change dressing every week Follow-up Appointments: Wound #7 Left,Lateral Lower Leg: Return Appointment in 1 week. Edema Control: Wound #7 Left,Lateral Lower Leg: 3 Layer Compression System - Bilateral 1. Triamcinolone, silver alginate/ABDs and 3 layer compression on the left leg with the wound 2. Out of concern about imminent skin breakdown on the right calf I went ahead and wrapped this as well 3 layer compression 3. I have asked her to bring her juxta lite stockings in next week to see if these are being properly applied 4. They say she is using her compression pumps once a day for an hour unfortunately she may need to do more than this. 5. She sleeps in her recliner with her legs dependent. The exact reason for this is not clear. Electronic Signature(s) Signed: 02/27/2019  4:48:37 PM By: Linton Ham MD Entered By: Linton Ham on 02/27/2019 15:14:35 [place], [name] ([id number]) -------------------------------------------------------------------------------- ROS/PFSH Details Patient Name: [name], Cassandra M. Date of Service: 02/27/2019 2:15 PM Medical Record Number: [id number] Patient Account Number: [id number] Date of Birth/Sex: 19-Nov-1924 (85 y.o. F) Treating RN: Army Melia Primary Care Provider: Myrtie Hawk Other Clinician: Referring Provider: Myrtie Hawk Treating Provider/Extender: [name] in Treatment:  0 Information Obtained From Patient Constitutional Symptoms (General Health) Complaints and Symptoms: Negative for: Fatigue; Fever; Chills; Marked Weight Change Eyes Complaints and Symptoms: Negative for: Dry Eyes; Vision Changes; Glasses / Contacts Medical History: Positive for: Cataracts - removed; Glaucoma Negative for: Optic Neuritis Ear/Nose/Mouth/Throat Complaints and Symptoms: Negative for: Difficult clearing ears; Sinusitis Medical History: Negative for: Chronic sinus problems/congestion; Middle ear problems Hematologic/Lymphatic Complaints and Symptoms: Negative for: Bleeding / Clotting Disorders; Human Immunodeficiency Virus Medical History: Negative for: Anemia; Hemophilia; Human Immunodeficiency Virus; Lymphedema; Sickle Cell Disease Respiratory Complaints and Symptoms: Negative for: Chronic or frequent coughs; Shortness of Breath Medical History: Negative for: Aspiration; Asthma; Chronic Obstructive Pulmonary Disease (COPD); Pneumothorax; Sleep Apnea; Tuberculosis Cardiovascular Complaints and Symptoms: Negative for: Chest pain; LE edema Medical History: Positive for: Arrhythmia; Congestive Heart Failure; Coronary Artery Disease; Hypertension Negative for: Angina; Deep Vein Thrombosis; Hypotension; Myocardial Infarction; Peripheral Arterial Disease;  Peripheral [name], NEVERS. ([id number]) Venous Disease; Phlebitis; Vasculitis Gastrointestinal Complaints and Symptoms: Negative for: Frequent diarrhea; Nausea; Vomiting Medical History: Negative for: Cirrhosis ; Colitis; Crohnos; Hepatitis A; Hepatitis B; Hepatitis C Endocrine Complaints and Symptoms: Negative for: Hepatitis; Thyroid disease; Polydypsia (Excessive Thirst) Medical History: Positive for: Type II Diabetes Negative for: Type I Diabetes Time with diabetes: 20 years Treated with: Oral agents Blood sugar tested every day: Yes Tested : twice daily Genitourinary Complaints and Symptoms: Negative for: Kidney failure/ Dialysis; Incontinence/dribbling Medical History: Negative for: End Stage Renal Disease Immunological Complaints and Symptoms: Negative for: Hives; Itching Medical History: Negative for: Lupus Erythematosus; Raynaudos; Scleroderma Integumentary (Skin) Complaints and Symptoms: Positive for: Wounds Negative for: Bleeding or bruising tendency; Breakdown; Swelling Medical History: Negative for: History of Burn; History of pressure wounds Musculoskeletal Complaints and Symptoms: Negative for: Muscle Pain; Muscle Weakness Medical History: Positive for: Gout; Osteoarthritis - bilateral knees Negative for: Rheumatoid Arthritis; Osteomyelitis Neurologic [name], Cassandra M. ([id number]) Complaints and Symptoms: Negative for: Numbness/parasthesias; Focal/Weakness Medical History: Negative for: Dementia; Neuropathy; Quadriplegia; Paraplegia; Seizure Disorder Psychiatric Complaints and Symptoms: Negative for: Anxiety; Claustrophobia Medical History: Negative for: Anorexia/bulimia; Confinement Anxiety Oncologic Medical History: Negative for: Received Chemotherapy; Received Radiation HBO Extended History Items Eyes: Eyes: Cataracts Glaucoma Immunizations Pneumococcal Vaccine: Received Pneumococcal Vaccination: Yes Implantable  Devices None Family and Social History Cancer: Yes - Child; Diabetes: Yes - Siblings; Heart Disease: Yes - Father,Child; Hypertension: Yes - Child; Kidney Disease: No; Lung Disease: No; Seizures: No; Thyroid Problems: Yes - Siblings; Tuberculosis: No; Never smoker; Marital Status - Widowed; Alcohol Use: Never; Drug Use: No History; Caffeine Use: Moderate Electronic Signature(s) Signed: 02/27/2019 2:59:41 PM By: Army Melia Signed: 02/27/2019 4:48:37 PM By: Linton Ham MD Entered By: Army Melia on 02/27/2019 14:32:39 [place], [name] ([id number]) -------------------------------------------------------------------------------- SuperBill Details Patient Name: [name], Nikayla M. Date of Service: 02/27/2019 Medical Record Number: [id number] Patient Account Number: [id number] Date of Birth/Sex: 1924-12-22 (85 y.o. F) Treating RN: Primary Care Provider: Myrtie Hawk Other Clinician: Referring Provider: Myrtie Hawk Treating Provider/Extender: Tito Dine in Treatment: 0 Diagnosis Coding ICD-10 Codes Code Description I87.323 Chronic venous hypertension (idiopathic) with inflammation of bilateral lower extremity L97.221 Non-pressure chronic ulcer of left calf limited to breakdown of skin I89.0 Lymphedema, not elsewhere classified E11.622 Type 2 diabetes mellitus with other skin ulcer Facility Procedures CPT4 Code: 65035465 Description: 99213 - WOUND CARE VISIT-LEV 3 EST PT Modifier: Quantity: 1 Physician Procedures CPT4 Code Description: [id number] 99214 - WC PHYS LEVEL 4 - EST PT ICD-10 Diagnosis Description I87.323 Chronic venous hypertension (idiopathic) with inflammation of L97.221  Non-pressure chronic ulcer of left calf limited to breakdown o I89.0 Lymphedema,  not elsewhere classified E11.622 Type 2 diabetes mellitus with other skin ulcer Modifier: bilateral lower e f skin Quantity: 1 xtremity Electronic Signature(s) Signed:  02/27/2019 4:48:37 PM By: Linton Ham MD Entered By: Linton Ham on 02/27/2019 15:17:41

## 2019-03-04 ENCOUNTER — Encounter: Payer: Self-pay | Admitting: Podiatry

## 2019-03-04 ENCOUNTER — Telehealth: Payer: Self-pay | Admitting: Cardiovascular Disease

## 2019-03-04 ENCOUNTER — Ambulatory Visit: Payer: PPO | Admitting: Podiatry

## 2019-03-04 ENCOUNTER — Other Ambulatory Visit: Payer: Self-pay

## 2019-03-04 DIAGNOSIS — D689 Coagulation defect, unspecified: Secondary | ICD-10-CM

## 2019-03-04 DIAGNOSIS — B351 Tinea unguium: Secondary | ICD-10-CM

## 2019-03-04 DIAGNOSIS — M79675 Pain in left toe(s): Secondary | ICD-10-CM | POA: Diagnosis not present

## 2019-03-04 DIAGNOSIS — M79674 Pain in right toe(s): Secondary | ICD-10-CM

## 2019-03-04 DIAGNOSIS — E1142 Type 2 diabetes mellitus with diabetic polyneuropathy: Secondary | ICD-10-CM

## 2019-03-04 NOTE — Progress Notes (Signed)
Complaint:  Visit Type: Patient returns to my office for continued preventative foot care services. Complaint: Patient states" my nails have grown long and thick and become painful to walk and wear shoes" Patient has been diagnosed with DM with neuropathy. The patient presents for preventative foot care services. No changes to ROS.  Patient is taking gabapentin.  Podiatric Exam: Vascular: dorsalis pedis  pulses are weakly palpable bilateral. PT pulses are not palpable  B/L. Capillary return is immediate. Temperature gradient is WNL. Skin turgor WNL  Sensorium: Normal Semmes Weinstein monofilament test. Normal tactile sensation bilaterally. Nail Exam: Pt has thick disfigured discolored nails with subungual debris noted bilateral entire nail hallux through fifth toenails Ulcer Exam: There is no evidence of ulcer or pre-ulcerative changes or infection. Orthopedic Exam: Muscle tone and strength are WNL. No limitations in general ROM. No crepitus or effusions noted. Foot type and digits show no abnormalities. HAV  B/L. Skin: No Porokeratosis. No infection or ulcers  Diagnosis:  Onychomycosis, , Pain in right toe, pain in left toes,  Diabetes with vascular disease. HAV  B/L.  Treatment & Plan Procedures and Treatment: Consent by patient was obtained for treatment procedures.   Debridement of mycotic and hypertrophic toenails, 1 through 5 bilateral and clearing of subungual debris. No ulceration, no infection noted. Return Visit-Office Procedure: Patient instructed to return to the office for a follow up visit 3 months for continued evaluation and treatment.    Aailyah Dunbar DPM 

## 2019-03-04 NOTE — Telephone Encounter (Signed)
Patient daughter states patient has SOB increasing. Would like to know if pt can have an echo Pt c/o Shortness Of Breath: STAT if SOB developed within the last 24 hours or pt is noticeably SOB on the phone  1. Are you currently SOB (can you hear that pt is SOB on the phone)? no 2. How long have you been experiencing SOB? Last month off and on, more frequent last week or so.  3. Are you SOB when sitting or when up moving around? Up moving around  4. Are you currently experiencing any other symptoms? When she walks has to sit down to get her breath, breathes heavier

## 2019-03-05 NOTE — Telephone Encounter (Signed)
Spoke with patients daughter per release form. She states that she has been having shortness of breath and fatigue with activity. Inquired about her activity levels at home and she goes from chair to bathroom and back. She does see wound care for lower extremity swelling and blisters. Advised that if she has had deconditioning can also play a role in this. Discussed her weights which are steady increase and decrease based on alternating doses of her medication. Denies any chest pain and states that she just noticed her increased fatigue. Offered appointment with APP and that I would have scheduling call to get that arranged and she was agreeable with that plan. She was appreciative for the call with no further questions at this time.

## 2019-03-06 ENCOUNTER — Encounter: Payer: PPO | Attending: Internal Medicine | Admitting: Internal Medicine

## 2019-03-06 ENCOUNTER — Other Ambulatory Visit: Payer: Self-pay

## 2019-03-06 DIAGNOSIS — E1151 Type 2 diabetes mellitus with diabetic peripheral angiopathy without gangrene: Secondary | ICD-10-CM | POA: Diagnosis not present

## 2019-03-06 DIAGNOSIS — M1990 Unspecified osteoarthritis, unspecified site: Secondary | ICD-10-CM | POA: Insufficient documentation

## 2019-03-06 DIAGNOSIS — I110 Hypertensive heart disease with heart failure: Secondary | ICD-10-CM | POA: Insufficient documentation

## 2019-03-06 DIAGNOSIS — L97221 Non-pressure chronic ulcer of left calf limited to breakdown of skin: Secondary | ICD-10-CM | POA: Insufficient documentation

## 2019-03-06 DIAGNOSIS — I890 Lymphedema, not elsewhere classified: Secondary | ICD-10-CM | POA: Insufficient documentation

## 2019-03-06 DIAGNOSIS — I509 Heart failure, unspecified: Secondary | ICD-10-CM | POA: Diagnosis not present

## 2019-03-06 DIAGNOSIS — E11622 Type 2 diabetes mellitus with other skin ulcer: Secondary | ICD-10-CM | POA: Insufficient documentation

## 2019-03-06 DIAGNOSIS — Z952 Presence of prosthetic heart valve: Secondary | ICD-10-CM | POA: Diagnosis not present

## 2019-03-06 DIAGNOSIS — I872 Venous insufficiency (chronic) (peripheral): Secondary | ICD-10-CM | POA: Insufficient documentation

## 2019-03-06 DIAGNOSIS — M109 Gout, unspecified: Secondary | ICD-10-CM | POA: Insufficient documentation

## 2019-03-06 DIAGNOSIS — L97222 Non-pressure chronic ulcer of left calf with fat layer exposed: Secondary | ICD-10-CM | POA: Diagnosis not present

## 2019-03-06 DIAGNOSIS — I2510 Atherosclerotic heart disease of native coronary artery without angina pectoris: Secondary | ICD-10-CM | POA: Insufficient documentation

## 2019-03-06 NOTE — Progress Notes (Signed)
SHOFFNER TAMMRA, PRESSMAN ([id number]) Visit Report for 03/06/2019 HPI Details Patient Name: [name], [name]. Date of Service: 03/06/2019 1:45 PM Medical Record Number: [id number] Patient Account Number: [id number] Date of Birth/Sex: [birth year] (86 y.o. F) Treating RN: Cornell Barman Primary Care Provider: Myrtie Hawk Other Clinician: Referring Provider: Myrtie Hawk Treating Provider/Extender: Tito Dine in Treatment: 1 History of Present Illness HPI Description: 04/17/18 on evaluation today patient presents for initial inspection in our clinic concerning issues that she has been having with her gluteal region. Things actually appear to be doing much better at this point although we could go her daughter tells me that the patient was actually having areas that were draining in the gluteal region bilaterally. Fortunately these regions appear to have resolved. Still there is evidence of pressure injury to the gluteal region she has blanchable erythema but decreased capillary refill to the area which again is an early sign of pressure. Again I do think that her daily activity may be the cause of such. She does have a history of diabetes with a hemoglobin A1c of 7.8 on 04/11/18. She also has gout and hypertension. The patient does get around in her home according to what her daughter tells me. However she spends most of her day and actually her night as well in her recliner which I think is likely where the pressure is coming from. She tells me that she doesn't sleep in a bed and pretty much unless she's getting up to use the bathroom or eat she is in her recliner. This again I think is likely the biggest issue that we're seeing right now for what's causing the pressure to her gluteal region. 08/02/18 on evaluation today patient actually presents for reevaluation in clinic concerning issues that she's been having with her gluteal region which the good news is  appears to be completely healed at this time. There's no open wound currently. She did have bilateral lower extremity lymphedema which was starting to week more on the right lower extremity especially. With that being said this seems to be also doing better in resolving she's not wearing the compression stockings of I think she really is that compression stockings pretty much all the time. She does have TBI's that were performed and finalized on 04/30/18 they appear to be doing well with a right TBI of 0.70 in the left TBI of 0.98. Overall I feel like the patient is actually doing quite well although we do need to have some things that we go through with them as far as teaching is concerned in order to prevent issues from showing up in the future. No fevers, chills, nausea, or vomiting noted at this time. 10/19/2018 upon evaluation today patient presents for reevaluation our clinic concerning issues that she is actually having with her bilateral lower extremities. I have seen her a couple times previous where we have discussed a wound on her gluteal region which actually was not giving her any trouble technically the last time I saw her. She is always had issues with her legs and swelling she does have lymphedema pumps. In fact by some crazy coincidence she actually ended up having them ordered from 2 different locations all likely from the congestive heart failure clinic as well as vascular and ended up with 2 sets of lymphedema pumps. Nonetheless 1 of them she got covered by insurance the other they had to pay $700 for. Obviously I am not exactly sure what is going on in this  regard but she obviously does not need to have 2 sets of pumps. Fortunately there are no signs of infection in regard to patient's bilateral lower extremities. She has been tolerating the dressing changes without complication. No fevers, chills, nausea, vomiting, or diarrhea. She was wearing compression stockings for some  time until her leg started to weep. She was also using the lymphedema pumps according to her daughter who was present during the visit today until she started having issues with the weeping as well and then they were sure that she continue to use them. 10/26/2018 on evaluation today patient appears to be doing well with regard to her lower extremity ulcers. In fact we have actually gotten everything down to just 2 open areas and on the left this is just one very small region in fact. Overall I feel like she has done excellent in the short amount of time that we have been taking care of her legs and overall and very pleased in this regard. No fevers, chills, nausea, vomiting, or diarrhea. 11/02/18 on evaluation today patient appears to be doing excellent with regard to her bilateral lower extremities. She has been tolerating the dressing changes without complication. Fortunately she appears to be completely healed upon evaluation today. 11/22/2018 on evaluation today patient appears to be doing unfortunately somewhat worse compared to when I last saw her. [place] ([id number]) She has a new blister that is open since I last evaluated her unfortunately. The last time I did see her was actually on 9//20. Upon evaluation today she appears to have no signs of systemic infection which is good news there is also no signs of local infection I feel like this was simply more of a blister that came open. Her caregiver who is with her today states that when she is with her she has her use her lymphedema pumps twice a day. However she cannot speak to when others are caring for her. She also states that she wears the Velcro compression wraps on a regular basis. 11/29/2018 on evaluation today patient appears to be doing excellent in regard to her left lower extremity ulcer which actually shown signs of being completely healed. This has done an excellent job as far as healing is concerned and I am very  pleased in this regard [place] 02/27/2019 This is a 86 year old frail woman who is accompanied by her daughter. She has been in this clinic for 2 extended periods already this year initially early in the year for an area on her left gluteal and then more recently from 10/20/2018 through 11/29/2018 with wounds on her lower extremities. She has chronic venous insufficiency with secondary lymphedema. She has juxta lite stockings and external compression pumps at home which she apparently is reasonably compliant with. She does however sleep in a recliner with her legs dangling down all night. Her daughter tells [place] that she noted some increasing swelling over the last week with a new wound on the left lateral calf which started as a blister. She is also concerned about blisters developing on the lateral part of her right calf Past medical history is reviewed; she has bilateral lower extremity lymphedema, type 2 diabetes, history of congestive heart failure, heart valve replacement 2 years ago. The patient had arterial studies in March 2020. Her ABIs were noncompressible however her TBI's were normal bilaterally 0.7 on the right and 0.98 on the left 03/06/2019; the patient arrives in clinic today with excellent edema control. She has no open wound  on either leg and she has bilateral juxta lite stockings as well as external compression pumps at home. They are prepared to lubricate her skin at least nightly Electronic Signature(s) Signed: 03/06/2019 4:45:14 PM By: Linton Ham MD Entered By: Linton Ham on 03/06/2019 16:19:38 [place], [name] ([id number]) -------------------------------------------------------------------------------- Physical Exam Details Patient Name: [name], Zyanna M. Date of Service: 03/06/2019 1:45 PM Medical Record Number: [id number] Patient Account Number: [id number] Date of Birth/Sex: Feb 28, 1925 (86 y.o. F) Treating RN: Cornell Barman Primary Care Provider:  Myrtie Hawk Other Clinician: Referring Provider: Myrtie Hawk Treating Provider/Extender: Tito Dine in Treatment: 1 Constitutional Patient is hypertensive.. Pulse regular and within target range for patient.Marland Kitchen Respirations regular, non-labored and within target range.. Temperature is normal and within the target range for the patient.Marland Kitchen appears in no distress. Notes Wound exam; the patient has no open wound today. The area on the left lateral calf is healed. She has excellent edema control. Electronic Signature(s) Signed: 03/06/2019 4:45:14 PM By: Linton Ham MD Entered By: Linton Ham on 03/06/2019 16:20:28 [place], [name] ([id number]) -------------------------------------------------------------------------------- Physician Orders Details Patient Name: [name], Eden M. Date of Service: 03/06/2019 1:45 PM Medical Record Number: [id number] Patient Account Number: [id number] Date of Birth/Sex: [birth year] (86 y.o. F) Treating RN: Cornell Barman Primary Care Provider: Myrtie Hawk Other Clinician: Referring Provider: Myrtie Hawk Treating Provider/Extender: Tito Dine in Treatment: 1 Verbal / Phone Orders: No Diagnosis Coding Edema Control o Patient to wear own compression stockings - First thing in the morning Discharge From [place] o Discharge from [place] - treatment complete Electronic Signature(s) Signed: 03/06/2019 4:45:14 PM By: Linton Ham MD Signed: 03/06/2019 4:51:02 PM By: Gretta Cool, BSN, RN, CWS, Kim RN, BSN Entered By: Gretta Cool, BSN, RN, CWS, Kim on 03/06/2019 14:07:03 [name], [name] ([id number]) -------------------------------------------------------------------------------- Problem List Details Patient Name: [name], Pretty M. Date of Service: 03/06/2019 1:45 PM Medical Record Number: [id number] Patient Account Number: [id number] Date of Birth/Sex:  1924/06/07 (86 y.o. F) Treating RN: Cornell Barman Primary Care Provider: Myrtie Hawk Other Clinician: Referring Provider: Myrtie Hawk Treating Provider/Extender: Tito Dine in Treatment: 1 Active Problems ICD-10 Evaluated Encounter Code Description Active Date Today Diagnosis I87.323 Chronic venous hypertension (idiopathic) with inflammation of 02/27/2019 No Yes bilateral lower extremity L97.221 Non-pressure chronic ulcer of left calf limited to breakdown of 02/27/2019 No Yes skin I89.0 Lymphedema, not elsewhere classified 02/27/2019 No Yes E11.622 Type 2 diabetes mellitus with other skin ulcer 02/27/2019 No Yes Inactive Problems Resolved Problems Electronic Signature(s) Signed: 03/06/2019 4:45:14 PM By: Linton Ham MD Entered By: Linton Ham on 03/06/2019 16:18:46 [place], [name] ([id number]) -------------------------------------------------------------------------------- Progress Note Details Patient Name: [name], Ginia M. Date of Service: 03/06/2019 1:45 PM Medical Record Number: [id number] Patient Account Number: [id number] Date of Birth/Sex: Apr 04, 1924 (86 y.o. F) Treating RN: Cornell Barman Primary Care Provider: Myrtie Hawk Other Clinician: Referring Provider: Myrtie Hawk Treating Provider/Extender: Ricard Dillon Weeks in Treatment: 1 Subjective History of Present Illness (HPI) 04/17/18 on evaluation today patient presents for initial inspection in our clinic concerning issues that she has been having with her gluteal region. Things actually appear to be doing much better at this point although we could go her daughter tells me that the patient was actually having areas that were draining in the gluteal region bilaterally. Fortunately these regions appear to have resolved. Still there is evidence of pressure injury to the gluteal region she has blanchable erythema but decreased capillary refill to  the area which again is an early sign of pressure. Again I do think that her daily activity may be the cause of such. She does have a history of diabetes with a hemoglobin A1c of 7.8 on 04/11/18. She also has gout and hypertension. The patient does get around in her home according to what her daughter tells me. However she spends most of her day and actually her night as well in her recliner which I think is likely where the pressure is coming from. She tells me that she doesn't sleep in a bed and pretty much unless she's getting up to use the bathroom or eat she is in her recliner. This again I think is likely the biggest issue that we're seeing right now for what's causing the pressure to her gluteal region. 08/02/18 on evaluation today patient actually presents for reevaluation in clinic concerning issues that she's been having with her gluteal region which the good news is appears to be completely healed at this time. There's no open wound currently. She did have bilateral lower extremity lymphedema which was starting to week more on the right lower extremity especially. With that being said this seems to be also doing better in resolving she's not wearing the compression stockings of I think she really is that compression stockings pretty much all the time. She does have TBI's that were performed and finalized on 04/30/18 they appear to be doing well with a right TBI of 0.70 in the left TBI of 0.98. Overall I feel like the patient is actually doing quite well although we do need to have some things that we go through with them as far as teaching is concerned in order to prevent issues from showing up in the future. No fevers, chills, nausea, or vomiting noted at this time. 10/19/2018 upon evaluation today patient presents for reevaluation our clinic concerning issues that she is actually having with her bilateral lower extremities. I have seen her a couple times previous where we have discussed a wound  on her gluteal region which actually was not giving her any trouble technically the last time I saw her. She is always had issues with her legs and swelling she does have lymphedema pumps. In fact by some crazy coincidence she actually ended up having them ordered from 2 different locations all likely from the congestive heart failure clinic as well as vascular and ended up with 2 sets of lymphedema pumps. Nonetheless 1 of them she got covered by insurance the other they had to pay $700 for. Obviously I am not exactly sure what is going on in this regard but she obviously does not need to have 2 sets of pumps. Fortunately there are no signs of infection in regard to patient's bilateral lower extremities. She has been tolerating the dressing changes without complication. No fevers, chills, nausea, vomiting, or diarrhea. She was wearing compression stockings for some time until her leg started to weep. She was also using the lymphedema pumps according to her daughter who was present during the visit today until she started having issues with the weeping as well and then they were sure that she continue to use them. 10/26/2018 on evaluation today patient appears to be doing well with regard to her lower extremity ulcers. In fact we have actually gotten everything down to just 2 open areas and on the left this is just one very small region in fact. Overall I feel like she has done excellent in the short amount of  time that we have been taking care of her legs and overall and very pleased in this regard. No fevers, chills, nausea, vomiting, or diarrhea. 11/02/18 on evaluation today patient appears to be doing excellent with regard to her bilateral lower extremities. She has been tolerating the dressing changes without complication. Fortunately she appears to be completely healed upon evaluation today. 11/22/2018 on evaluation today patient appears to be doing unfortunately somewhat worse compared to when I  last saw her. She has a new blister that is open since I last evaluated her unfortunately. The last time I did see her was actually on 9//20. Upon evaluation today she appears to have no signs of systemic infection which is good news there is also no signs of local infection I feel like this was simply more of a blister that came open. Her caregiver who is with her today states that when she is with her she has her use her lymphedema pumps twice a day. However she cannot speak to when others are caring for [facility] ELDANA, ISIP. ([id number]) her. She also states that she wears the Velcro compression wraps on a regular basis. 11/29/2018 on evaluation today patient appears to be doing excellent in regard to her left lower extremity ulcer which actually shown signs of being completely healed. This has done an excellent job as far as healing is concerned and I am very pleased in this regard [place] 02/27/2019 This is a 86 year old frail woman who is accompanied by her daughter. She has been in this clinic for 2 extended periods already this year initially early in the year for an area on her left gluteal and then more recently from 10/20/2018 through 11/29/2018 with wounds on her lower extremities. She has chronic venous insufficiency with secondary lymphedema. She has juxta lite stockings and external compression pumps at home which she apparently is reasonably compliant with. She does however sleep in a recliner with her legs dangling down all night. Her daughter tells [place] that she noted some increasing swelling over the last week with a new wound on the left lateral calf which started as a blister. She is also concerned about blisters developing on the lateral part of her right calf Past medical history is reviewed; she has bilateral lower extremity lymphedema, type 2 diabetes, history of congestive heart failure, heart valve replacement 2 years ago. The patient had arterial studies in March  2020. Her ABIs were noncompressible however her TBI's were normal bilaterally 0.7 on the right and 0.98 on the left 03/06/2019; the patient arrives in clinic today with excellent edema control. She has no open wound on either leg and she has bilateral juxta lite stockings as well as external compression pumps at home. They are prepared to lubricate her skin at least nightly Objective Constitutional Patient is hypertensive.. Pulse regular and within target range for patient.Marland Kitchen Respirations regular, non-labored and within target range.. Temperature is normal and within the target range for the patient.Marland Kitchen appears in no distress. Vitals Time Taken: 1:45 PM, Temperature: 98.2 F, Pulse: 79 bpm, Respiratory Rate: 16 breaths/min, Blood Pressure: 146/68 mmHg. General Notes: Wound exam; the patient has no open wound today. The area on the left lateral calf is healed. She has excellent edema control. Integumentary (Hair, Skin) Wound #7 status is Healed - Epithelialized. Original cause of wound was Blister. The wound is located on the Left,Lateral Lower Leg. The wound measures 0cm length x 0cm width x 0cm depth; 0cm^2 area and 0cm^3 volume. There is Fat Layer (  Subcutaneous Tissue) Exposed exposed. There is no tunneling or undermining noted. There is a medium amount of serosanguineous drainage noted. There is large (67-100%) red granulation within the wound bed. There is a small (1-33%) amount of necrotic tissue within the wound bed including Adherent [place]ough. SHOFFNER LEXII, WALSH ([id number]) Assessment Active Problems ICD-10 Chronic venous hypertension (idiopathic) with inflammation of bilateral lower extremity Non-pressure chronic ulcer of left calf limited to breakdown of skin Lymphedema, not elsewhere classified Type 2 diabetes mellitus with other skin ulcer Plan Edema Control: Patient to wear own compression stockings - First thing in the morning Discharge From [facility] Services: Discharge from  [place] - treatment complete 1. The patient can be discharged from the wound center. 2. She has her own compression stockings/juxta lite stockings. Electronic Signature(s) Signed: 03/06/2019 4:45:14 PM By: Linton Ham MD Entered By: Linton Ham on 03/06/2019 16:20:58 [place], [name] ([id number]) -------------------------------------------------------------------------------- SuperBill Details Patient Name: [name], Lucindia M. Date of Service: 03/06/2019 Medical Record Number: [id number] Patient Account Number: [id number] Date of Birth/Sex: 1925-01-17 (86 y.o. F) Treating RN: Cornell Barman Primary Care Provider: Myrtie Hawk Other Clinician: Referring Provider: Myrtie Hawk Treating Provider/Extender: Tito Dine in Treatment: 1 Diagnosis Coding ICD-10 Codes Code Description [phone] Chronic venous hypertension (idiopathic) with inflammation of bilateral lower extremity L97.221 Non-pressure chronic ulcer of left calf limited to breakdown of skin I89.0 Lymphedema, not elsewhere classified E11.622 Type 2 diabetes mellitus with other skin ulcer Facility Procedures CPT4 Code: [id number] Description: 74128 - WOUND CARE VISIT-LEV 2 EST PT Modifier: Quantity: 1 Physician Procedures CPT4 Code Description: [id number] 09470 - WC PHYS LEVEL 2 - EST PT ICD-10 Diagnosis Description L97.221 Non-pressure chronic ulcer of left calf limited to breakdown o I87.323 Chronic venous hypertension (idiopathic) with inflammation of Modifier: f skin bilateral lower e Quantity: 1 xtremity Electronic Signature(s) Signed: 03/06/2019 4:45:14 PM By: Linton Ham MD Entered By: Linton Ham on 03/06/2019 16:21:18

## 2019-03-07 ENCOUNTER — Telehealth: Payer: Self-pay | Admitting: Internal Medicine

## 2019-03-07 ENCOUNTER — Other Ambulatory Visit: Payer: Self-pay | Admitting: Internal Medicine

## 2019-03-07 DIAGNOSIS — B3731 Acute candidiasis of vulva and vagina: Secondary | ICD-10-CM

## 2019-03-07 DIAGNOSIS — B373 Candidiasis of vulva and vagina: Secondary | ICD-10-CM

## 2019-03-07 MED ORDER — FLUCONAZOLE 150 MG PO TABS: 150.0000 mg | ORAL_TABLET | Freq: Once | ORAL | 1 refills | Status: DC

## 2019-03-07 NOTE — Telephone Encounter (Signed)
Pt daughter would like to have labs done. Can pt get labs done before the appt? And or is pt due for labs? Please advise and Thank you!  Call pt daughter @ [phone]

## 2019-03-07 NOTE — Progress Notes (Signed)
[place] ([id number]) Visit Report for 03/06/2019 Arrival Information Details Patient Name: [name], [name]. Date of Service: 03/06/2019 1:45 PM Medical Record Number: [id number] Patient Account Number: [id number] Date of Birth/Sex: [birth year] (86 y.o. F) Treating RN: Cornell Barman Primary Care Dreshawn Hendershott: Myrtie Hawk Other Clinician: Referring Lakyn Alsteen: Myrtie Hawk Treating Eytan Carrigan/Extender: Tito Dine in Treatment: 1 Visit Information History Since Last Visit Added or deleted any medications: No Patient Arrived: Wheel Chair Any new allergies or adverse reactions: No Arrival Time: 13:46 Had a fall or experienced change in No Accompanied By: daughter activities of daily living that may affect Transfer Assistance: Manual risk of falls: Patient Identification Verified: Yes Signs or symptoms of abuse/neglect since last visito No Secondary Verification Process Completed: Yes Hospitalized since last visit: No Implantable device outside of the clinic excluding No cellular tissue based products placed in the center since last visit: Has Dressing in Place as Prescribed: Yes Has Compression in Place as Prescribed: Yes Pain Present Now: No Electronic Signature(s) Signed: 03/06/2019 3:30:55 PM By: Lorine Bears RCP, RRT, CHT Entered By: Becky Sax, Amado Nash on 03/06/2019 13:46:57 [name], [name] ([id number]) -------------------------------------------------------------------------------- Clinic Level of Care Assessment Details Patient Name: [name], Cassandra M. Date of Service: 03/06/2019 1:45 PM Medical Record Number: [id number] Patient Account Number: [id number] Date of Birth/Sex: April 26, 1924 (86 y.o. F) Treating RN: Cornell Barman Primary Care Tran Arzuaga: Myrtie Hawk Other Clinician: Referring Keene Gilkey: Myrtie Hawk Treating Edita Weyenberg/Extender: Tito Dine in Treatment:  1 Clinic Level of Care Assessment Items TOOL 4 Quantity Score []  - Use when only an EandM is performed on FOLLOW-UP visit 0 ASSESSMENTS - Nursing Assessment / Reassessment []  - Reassessment of Co-morbidities (includes updates in patient status) 0 X- 1 5 Reassessment of Adherence to Treatment Plan ASSESSMENTS - Wound and Skin Assessment / Reassessment X - Simple Wound Assessment / Reassessment - one wound 1 5 []  - 0 Complex Wound Assessment / Reassessment - multiple wounds []  - 0 Dermatologic / Skin Assessment (not related to wound area) ASSESSMENTS - Focused Assessment []  - Circumferential Edema Measurements - multi extremities 0 []  - 0 Nutritional Assessment / Counseling / Intervention []  - 0 Lower Extremity Assessment (monofilament, tuning fork, pulses) []  - 0 Peripheral Arterial Disease Assessment (using hand held doppler) ASSESSMENTS - Ostomy and/or Continence Assessment and Care []  - Incontinence Assessment and Management 0 []  - 0 Ostomy Care Assessment and Management (repouching, etc.) PROCESS - Coordination of Care X - Simple Patient / Family Education for ongoing care 1 15 []  - 0 Complex (extensive) Patient / Family Education for ongoing care []  - 0 Staff obtains Programmer, systems, Records, Test Results / Process Orders []  - 0 Staff telephones HHA, Nursing Homes / Clarify orders / etc []  - 0 Routine Transfer to another Facility (non-emergent condition) []  - 0 Routine Hospital Admission (non-emergent condition) []  - 0 New Admissions / Biomedical engineer / Ordering NPWT, Apligraf, etc. []  - 0 Emergency Hospital Admission (emergent condition) X- 1 10 Simple Discharge Coordination Cassandra VELINDA, WROBEL. ([id number]) []  - 0 Complex (extensive) Discharge Coordination PROCESS - Special Needs []  - Pediatric / Minor Patient Management 0 []  - 0 Isolation Patient Management []  - 0 Hearing / Language / Visual special needs []  - 0 Assessment of Community assistance  (transportation, D/C planning, etc.) []  - 0 Additional assistance / Altered mentation []  - 0 Support Surface(s) Assessment (bed, cushion, seat, etc.) INTERVENTIONS - Wound Cleansing / Measurement X - Simple Wound Cleansing - one  wound 1 5 []  - 0 Complex Wound Cleansing - multiple wounds []  - 0 Wound Imaging (photographs - any number of wounds) []  - 0 Wound Tracing (instead of photographs) X- 1 5 Simple Wound Measurement - one wound []  - 0 Complex Wound Measurement - multiple wounds INTERVENTIONS - Wound Dressings []  - Small Wound Dressing one or multiple wounds 0 []  - 0 Medium Wound Dressing one or multiple wounds []  - 0 Large Wound Dressing one or multiple wounds []  - 0 Application of Medications - topical []  - 0 Application of Medications - injection INTERVENTIONS - Miscellaneous []  - External ear exam 0 []  - 0 Specimen Collection (cultures, biopsies, blood, body fluids, etc.) []  - 0 Specimen(s) / Culture(s) sent or taken to Lab for analysis []  - 0 Patient Transfer (multiple staff / Civil Service fast streamer / Similar devices) []  - 0 Simple Staple / Suture removal (25 or less) []  - 0 Complex Staple / Suture removal (26 or more) []  - 0 Hypo / Hyperglycemic Management (close monitor of Blood Glucose) []  - 0 Ankle / Brachial Index (ABI) - do not check if billed separately X- 1 5 Vital Signs [name], Cassandra M. ([id number]) Has the patient been seen at the hospital within the last three years: Yes Total Score: 50 Level Of Care: New/Established - Level 2 Electronic Signature(s) Signed: 03/06/2019 4:51:02 PM By: Gretta Cool, BSN, RN, CWS, Kim RN, BSN Entered By: Gretta Cool, BSN, RN, CWS, Kim on 03/06/2019 14:07:44 [place], [name] ([id number]) -------------------------------------------------------------------------------- Encounter Discharge Information Details Patient Name: [name], Cassandra M. Date of Service: 03/06/2019 1:45 PM Medical Record Number: [id number] Patient  Account Number: [id number] Date of Birth/Sex: [birth year] (86 y.o. F) Treating RN: Cornell Barman Primary Care Raza Bayless: Myrtie Hawk Other Clinician: Referring Jamai Dolce: Myrtie Hawk Treating Saahas Hidrogo/Extender: Tito Dine in Treatment: 1 Encounter Discharge Information Items Discharge Condition: Stable Ambulatory Status: Ambulatory Discharge Destination: Home Transportation: Private Auto Accompanied By: daughter Schedule Follow-up Appointment: Yes Clinical Summary of Care: Electronic Signature(s) Signed: 03/06/2019 4:51:02 PM By: Gretta Cool, BSN, RN, CWS, Kim RN, BSN Entered By: Gretta Cool, BSN, RN, CWS, Kim on 03/06/2019 14:08:46 [place], [name] ([id number]) -------------------------------------------------------------------------------- Lower Extremity Assessment Details Patient Name: [name], Cassandra M. Date of Service: 03/06/2019 1:45 PM Medical Record Number: [id number] Patient Account Number: [id number] Date of Birth/Sex: May 12, 1924 (86 y.o. F) Treating RN: Army Melia Primary Care Marino Rogerson: Myrtie Hawk Other Clinician: Referring Lareen Mullings: Myrtie Hawk Treating Cayleigh Paull/Extender: Ricard Dillon Weeks in Treatment: 1 Edema Assessment Assessed: [Left: No] [Right: No] Edema: [Left: No] [Right: No] Calf Left: Right: Point of Measurement: 31 cm From Medial Instep 36 cm 34.5 cm Ankle Left: Right: Point of Measurement: 11 cm From Medial Instep 25.5 cm 25 cm Vascular Assessment Pulses: Dorsalis Pedis Palpable: [Left:Yes] [Right:Yes] Electronic Signature(s) Signed: 03/07/2019 11:26:15 AM By: Army Melia Entered By: Army Melia on 03/06/2019 13:58:34 [name], [name] ([id number]) -------------------------------------------------------------------------------- Multi Wound Chart Details Patient Name: [name], Cassandra M. Date of Service: 03/06/2019 1:45 PM Medical Record Number: [id number] Patient Account  Number: [id number] Date of Birth/Sex: 1924-12-03 (86 y.o. F) Treating RN: Cornell Barman Primary Care Aadhira Heffernan: Myrtie Hawk Other Clinician: Referring Blimi Godby: Myrtie Hawk Treating Samual Beals/Extender: Tito Dine in Treatment: 1 Vital Signs Height(in): Pulse(bpm): 67 Weight(lbs): Blood Pressure(mmHg): 146/68 Body Mass Index(BMI): Temperature(F): 98.2 Respiratory Rate 16 (breaths/min): Photos: [N/A:N/A] Wound Location: Left, Lateral Lower Leg N/A N/A Wounding Event: Blister N/A N/A Primary Etiology: Venous Leg Ulcer N/A N/A Comorbid History: Cataracts, Glaucoma, N/A N/A  Arrhythmia, Congestive Heart Failure, Coronary Artery Disease, Hypertension, Type II Diabetes, Gout, Osteoarthritis Date Acquired: 02/23/2019 N/A N/A Weeks of Treatment: 1 N/A N/A Wound Status: Healed - Epithelialized N/A N/A Measurements L x W x D 0x0x0 N/A N/A (cm) Area (cm) : 0 N/A N/A Volume (cm) : 0 N/A N/A % Reduction in Area: 100.00% N/A N/A % Reduction in Volume: 100.00% N/A N/A Classification: Partial Thickness N/A N/A Exudate Amount: Medium N/A N/A Exudate Type: Serosanguineous N/A N/A Exudate Color: red, brown N/A N/A Granulation Amount: Large (67-100%) N/A N/A Granulation Quality: Red N/A N/A Necrotic Amount: Small (1-33%) N/A N/A Exposed Structures: Fat Layer (Subcutaneous N/A N/A Tissue) Exposed: Yes Fascia: No Tendon: No Muscle: No [name], [name] ([id number]) Joint: No Bone: No Epithelialization: None N/A N/A Treatment Notes Electronic Signature(s) Signed: 03/06/2019 4:45:14 PM By: Linton Ham MD Entered By: Linton Ham on 03/06/2019 16:18:58 [name], [name] ([id number]) -------------------------------------------------------------------------------- [place] Details Patient Name: [name], Janelys M. Date of Service: 03/06/2019 1:45 PM Medical Record Number: [id number] Patient Account Number:  [id number] Date of Birth/Sex: Jun 30, 1924 (86 y.o. F) Treating RN: Cornell Barman Primary Care Rogena Deupree: Myrtie Hawk Other Clinician: Referring Tarahji Ramthun: Myrtie Hawk Treating Oakleigh Hesketh/Extender: Tito Dine in Treatment: 1 Active Inactive Electronic Signature(s) Signed: 03/06/2019 4:51:02 PM By: Gretta Cool, BSN, RN, CWS, Kim RN, BSN Entered By: Gretta Cool, BSN, RN, CWS, Kim on 03/06/2019 14:06:00 [name], [name] ([id number]) -------------------------------------------------------------------------------- Pain Assessment Details Patient Name: [name], Madysin M. Date of Service: 03/06/2019 1:45 PM Medical Record Number: [id number] Patient Account Number: [id number] Date of Birth/Sex: 1924-07-20 (86 y.o. F) Treating RN: Cornell Barman Primary Care Shandria Clinch: Myrtie Hawk Other Clinician: Referring Carlous Olivares: Myrtie Hawk Treating Ashland Osmer/Extender: Tito Dine in Treatment: 1 Active Problems Location of Pain Severity and Description of Pain Patient Has Paino No Site Locations Pain Management and Medication Current Pain Management: Electronic Signature(s) Signed: 03/06/2019 3:30:55 PM By: Lorine Bears RCP, RRT, CHT Signed: 03/06/2019 4:51:02 PM By: Gretta Cool, BSN, RN, CWS, Kim RN, BSN Entered By: Lorine Bears on 03/06/2019 13:47:03 [place] ([id number]) -------------------------------------------------------------------------------- Patient/Caregiver Education Details Patient Name: [name], Sommer M. Date of Service: 03/06/2019 1:45 PM Medical Record Number: [id number] Patient Account Number: [id number] Date of Birth/Gender: [birth year] (86 y.o. F) Treating RN: Cornell Barman Primary Care Physician: Myrtie Hawk Other Clinician: Referring Physician: Myrtie Hawk Treating Physician/Extender: Tito Dine in Treatment: 1 Education Assessment Education  Provided To: Patient Education Topics Provided Venous: Handouts: Controlling Swelling with Compression Stockings Methods: Demonstration, Explain/Verbal Responses: State content correctly Electronic Signature(s) Signed: 03/06/2019 4:51:02 PM By: Gretta Cool, BSN, RN, CWS, Kim RN, BSN Entered By: Gretta Cool, BSN, RN, CWS, Kim on 03/06/2019 14:08:23 [place], [name] ([id number]) -------------------------------------------------------------------------------- Wound Assessment Details Patient Name: [name], Monifa M. Date of Service: 03/06/2019 1:45 PM Medical Record Number: [id number] Patient Account Number: [id number] Date of Birth/Sex: 01/02/1925 (86 y.o. F) Treating RN: Cornell Barman Primary Care Alexandrya Chim: Myrtie Hawk Other Clinician: Referring Coti Burd: Myrtie Hawk Treating Taraji Mungo/Extender: Ricard Dillon Weeks in Treatment: 1 Wound Status Wound Number: 7 Primary Venous Leg Ulcer Etiology: Wound Location: Left, Lateral Lower Leg Wound Healed - Epithelialized Wounding Event: Blister Status: Date Acquired: 02/23/2019 Comorbid Cataracts, Glaucoma, Arrhythmia, Congestive Weeks Of Treatment: 1 History: Heart Failure, Coronary Artery Disease, Clustered Wound: No Hypertension, Type II Diabetes, Gout, Osteoarthritis Photos Wound Measurements Length: (cm) 0 % Reductio Width: (cm) 0 % Reductio Depth: (cm) 0 Epithelial Area: (cm) 0 Tunneling Volume: (cm) 0  Undermini n in Area: 100% n in Volume: 100% ization: None : No ng: No Wound Description Classification: Partial Thickness Foul Odor Exudate Amount: Medium Slough/Fib Exudate Type: Serosanguineous Exudate Color: red, brown After Cleansing: No rino Yes Wound Bed Granulation Amount: Large (67-100%) Exposed Structure Granulation Quality: Red Fascia Exposed: No Necrotic Amount: Small (1-33%) Fat Layer (Subcutaneous Tissue) Exposed: Yes Necrotic Quality: Adherent Slough Tendon Exposed:  No Muscle Exposed: No Joint Exposed: No Bone Exposed: No Electronic Signature(s) Cassandra SHAQUIA, BERKLEY ([id number]) Signed: 03/06/2019 4:51:02 PM By: Gretta Cool, BSN, RN, CWS, Kim RN, BSN Entered By: Gretta Cool, BSN, RN, CWS, Kim on 03/06/2019 14:05:46 [place] ([id number]) -------------------------------------------------------------------------------- Vitals Details Patient Name: [name], Valory M. Date of Service: 03/06/2019 1:45 PM Medical Record Number: [id number] Patient Account Number: [id number] Date of Birth/Sex: August 01, 1924 (86 y.o. F) Treating RN: Cornell Barman Primary Care Kaesen Rodriguez: Myrtie Hawk Other Clinician: Referring Vinie Charity: Myrtie Hawk Treating Katanya Schlie/Extender: Tito Dine in Treatment: 1 Vital Signs Time Taken: 13:45 Temperature (F): 98.2 Pulse (bpm): 79 Respiratory Rate (breaths/min): 16 Blood Pressure (mmHg): 146/68 Reference Range: 80 - 120 mg / dl Electronic Signature(s) Signed: 03/06/2019 3:30:55 PM By: Lorine Bears RCP, RRT, CHT Entered By: Lorine Bears on 03/06/2019 13:49:32

## 2019-03-11 ENCOUNTER — Encounter: Payer: Self-pay | Admitting: Internal Medicine

## 2019-03-11 NOTE — Addendum Note (Signed)
Addended by: Orland Mustard on: 03/11/2019 06:44 PM   Modules accepted: Orders

## 2019-03-11 NOTE — Telephone Encounter (Signed)
Call daughter and she if she can take her mom to [facility]  Lab orders sent to [facility] inform daughter  She will need to be fasting 8 hours nothing but water and meds but not diabetic meds   [place]

## 2019-03-13 ENCOUNTER — Encounter: Payer: Self-pay | Admitting: Family

## 2019-03-13 ENCOUNTER — Other Ambulatory Visit: Payer: Self-pay

## 2019-03-13 ENCOUNTER — Ambulatory Visit (INDEPENDENT_AMBULATORY_CARE_PROVIDER_SITE_OTHER): Payer: PPO | Admitting: Internal Medicine

## 2019-03-13 ENCOUNTER — Ambulatory Visit (INDEPENDENT_AMBULATORY_CARE_PROVIDER_SITE_OTHER): Payer: PPO | Admitting: Family

## 2019-03-13 VITALS — BP 110/68 | HR 76 | Ht 60.0 in | Wt 190.0 lb

## 2019-03-13 VITALS — BP 122/74 | Ht 60.0 in | Wt 190.0 lb

## 2019-03-13 DIAGNOSIS — E1122 Type 2 diabetes mellitus with diabetic chronic kidney disease: Secondary | ICD-10-CM

## 2019-03-13 DIAGNOSIS — R0600 Dyspnea, unspecified: Secondary | ICD-10-CM | POA: Diagnosis not present

## 2019-03-13 DIAGNOSIS — Z952 Presence of prosthetic heart valve: Secondary | ICD-10-CM | POA: Diagnosis not present

## 2019-03-13 DIAGNOSIS — N184 Chronic kidney disease, stage 4 (severe): Secondary | ICD-10-CM

## 2019-03-13 DIAGNOSIS — I10 Essential (primary) hypertension: Secondary | ICD-10-CM

## 2019-03-13 DIAGNOSIS — N179 Acute kidney failure, unspecified: Secondary | ICD-10-CM | POA: Diagnosis not present

## 2019-03-13 DIAGNOSIS — E785 Hyperlipidemia, unspecified: Secondary | ICD-10-CM

## 2019-03-13 DIAGNOSIS — M47817 Spondylosis without myelopathy or radiculopathy, lumbosacral region: Secondary | ICD-10-CM

## 2019-03-13 DIAGNOSIS — I872 Venous insufficiency (chronic) (peripheral): Secondary | ICD-10-CM

## 2019-03-13 DIAGNOSIS — D649 Anemia, unspecified: Secondary | ICD-10-CM

## 2019-03-13 DIAGNOSIS — Z1329 Encounter for screening for other suspected endocrine disorder: Secondary | ICD-10-CM | POA: Diagnosis not present

## 2019-03-13 DIAGNOSIS — I890 Lymphedema, not elsewhere classified: Secondary | ICD-10-CM

## 2019-03-13 DIAGNOSIS — F419 Anxiety disorder, unspecified: Secondary | ICD-10-CM | POA: Diagnosis not present

## 2019-03-13 DIAGNOSIS — R0609 Other forms of dyspnea: Secondary | ICD-10-CM

## 2019-03-13 DIAGNOSIS — I5032 Chronic diastolic (congestive) heart failure: Secondary | ICD-10-CM | POA: Diagnosis not present

## 2019-03-13 DIAGNOSIS — E1142 Type 2 diabetes mellitus with diabetic polyneuropathy: Secondary | ICD-10-CM

## 2019-03-13 DIAGNOSIS — I272 Pulmonary hypertension, unspecified: Secondary | ICD-10-CM | POA: Diagnosis not present

## 2019-03-13 DIAGNOSIS — I6523 Occlusion and stenosis of bilateral carotid arteries: Secondary | ICD-10-CM | POA: Diagnosis not present

## 2019-03-13 DIAGNOSIS — I350 Nonrheumatic aortic (valve) stenosis: Secondary | ICD-10-CM

## 2019-03-13 DIAGNOSIS — R5383 Other fatigue: Secondary | ICD-10-CM | POA: Diagnosis not present

## 2019-03-13 DIAGNOSIS — R600 Localized edema: Secondary | ICD-10-CM

## 2019-03-13 DIAGNOSIS — N761 Subacute and chronic vaginitis: Secondary | ICD-10-CM

## 2019-03-13 DIAGNOSIS — I739 Peripheral vascular disease, unspecified: Secondary | ICD-10-CM

## 2019-03-13 DIAGNOSIS — I509 Heart failure, unspecified: Secondary | ICD-10-CM

## 2019-03-13 DIAGNOSIS — F329 Major depressive disorder, single episode, unspecified: Secondary | ICD-10-CM | POA: Insufficient documentation

## 2019-03-13 MED ORDER — PEN NEEDLES 30G X 8 MM MISC: 1.0000 | 11 refills | Status: AC

## 2019-03-13 MED ORDER — LISINOPRIL 2.5 MG PO TABS: 2.5000 mg | ORAL_TABLET | Freq: Every day | ORAL | Status: DC | PRN

## 2019-03-13 MED ORDER — OZEMPIC (0.25 OR 0.5 MG/DOSE) 2 MG/1.5ML ~~LOC~~ SOPN: 0.2500 mg | PEN_INJECTOR | SUBCUTANEOUS | 11 refills | Status: DC

## 2019-03-13 NOTE — Progress Notes (Signed)
Virtual Visit via Video Note  I connected with [name]   on 03/13/19 at  9:45 AM EST by a video enabled telemedicine application and verified that I am speaking with the correct person using two identifiers.  Location patient: home Location provider:work or home office Persons participating in the virtual visit: patient, provider, pts daughter [name]  I discussed the limitations of evaluation and management by telemedicine and the availability of in person appointments. The patient expressed understanding and agreed to proceed.   HPI: 1. DM 2 uncontrolled last a1c 10.3 family other the daughter [name] is giving pt apple cider, ice cream, hot cocoa, sweets and at times pt wont eat unless she gets requested foods which was concerning  On glipizide 5 mg bid, januvia 50  2. C/o sob with exertion and fatigue with h/o CHF saw CHF clinic today for appt and cbc, a1c, bmet done 3. Pt c/o burning with urination with h/o yeast infectino  4. Wounds to skin healed and she just had last wound care clinic visit she she and daughter are happy about  5. Increased depression when not getting her way I.e food choices, increased restlessness, anxiety with separation of daughter [name] when [place] goes to the beach hose on ativan 0.5 1x per week and zoloft 50 mg currently daughter does not want to change medication doses but will let me know    ROS: See pertinent positives and negatives per HPI.  Past Medical History:  Diagnosis Date  . Anemia   . Carotid arterial disease ([place])    a. 08/2014 U/S: Bilateral < 50% stenosis. Patent vertebrals w/ antegrade flow.   . Cervical spine arthritis   . Chronic diastolic CHF (congestive heart failure) ([place])    a. echo 2014: EF 55-60%, DD;  b. 08/2014 Echo: EF 55-60%, no RWMA, GR1DD; c. 02/2016 Echo: EF 65-70%, Gr1 DD.  Marland Kitchen Cognitive impairment   . Depression   . Diabetes mellitus without complication ([place])   . Essential hypertension   . GERD (gastroesophageal  reflux disease)   . Glaucoma   . Gout   . History of renal impairment   . Hyperlipidemia   . Hypotension    a. Related to Norvasc - 11/2014 ED visit.  . Multinodular goiter    a. Noted incidentally on CT 07/2012 and carotid U/S 08/2014;  b. Nl TSH 11/2014.  Marland Kitchen Paroxysmal SVT (supraventricular tachycardia) (HCC)    a. on Toprol   . Prolapsed uterus   . Severe aortic stenosis    a. 11/15/16: s/p TAVR Edwards Sapien 3 THV (size 23 mm, model # 9600TFX, serial # [id number])  . Stroke [facility])    a. CT head 07/2014 showed small thalamic infarct  . Vitamin deficiency     Past Surgical History:  Procedure Laterality Date  . EYE SURGERY     unsure of what procedure, did know that laser was used  . IR RADIOLOGY PERIPHERAL GUIDED IV START  11/03/2016  . IR US GUIDE VASC ACCESS RIGHT  11/03/2016  . RIGHT/LEFT HEART CATH AND CORONARY ANGIOGRAPHY N/A 10/26/2016   Procedure: RIGHT/LEFT HEART CATH AND CORONARY ANGIOGRAPHY;  Surgeon: [name], MD;  Location: [facility];  Service: Cardiovascular;  Laterality: N/A;  . TEE WITHOUT CARDIOVERSION N/A 03/31/2016   Procedure: TRANSESOPHAGEAL ECHOCARDIOGRAM (TEE);  Surgeon: Minna Merritts, MD;  Location: [facility];  Service: Cardiovascular;  Laterality: N/A;  . TEE WITHOUT CARDIOVERSION N/A 11/15/2016   Procedure: TRANSESOPHAGEAL ECHOCARDIOGRAM (TEE);  Surgeon: [name],  MD;  Location: [facility];  Service: Open Heart Surgery;  Laterality: N/A;  . TRANSCATHETER AORTIC VALVE REPLACEMENT, TRANSFEMORAL N/A 11/15/2016   Procedure: TRANSCATHETER AORTIC VALVE REPLACEMENT, TRANSFEMORAL;  Surgeon: [name], MD;  Location: [facility];  Service: Open Heart Surgery;  Laterality: N/A;  . TUBAL LIGATION      Family History  Problem Relation Age of Onset  . Glaucoma Mother   . Diabetes Mother   . Peptic Ulcer Father   . Colitis Father     SOCIAL HX: lives at home daughter [name] primary caretaker   Current Outpatient Medications:  .  ACCU-CHEK AVIVA PLUS  test strip, USE 1 STRIP TO TEST BLOOD SUGARS 3 TIMES A DAY, Disp: 300 each, Rfl: 4 .  acetaminophen (TYLENOL) 500 MG tablet, Take 1 tablet (500 mg total) by mouth every 6 (six) hours as needed for mild pain or headache., Disp: 360 tablet, Rfl: 3 .  allopurinol (ZYLOPRIM) 100 MG tablet, Take 1 tablet (100 mg total) by mouth daily., Disp: 90 tablet, Rfl: 3 .  aspirin 81 MG chewable tablet, Chew 1 tablet (81 mg total) by mouth daily., Disp: 90 tablet, Rfl: 3 .  blood glucose meter kit and supplies, Dispense based on patient and insurance preference. Use 1x daily as directed. (FOR ICD-10 E10.9, E11.9)., Disp: 1 each, Rfl: 0 .  brimonidine (ALPHAGAN P) 0.1 % SOLN, Place 1 drop into the right eye 2 (two) times daily., Disp: , Rfl:  .  Carboxymethylcellulose Sodium (ARTIFICIAL TEARS OP), Apply 1 drop to eye 2 (two) times daily. Left eye, Disp: , Rfl:  .  cholecalciferol (VITAMIN D) 25 MCG (1000 UT) tablet, Take 1 tablet (1,000 Units total) by mouth daily., Disp: 90 tablet, Rfl: 3 .  clotrimazole (LOTRIMIN) 1 % cream, APPLY TOPICALLY TO THE AFFECTED AREA ONCE A DAY AT BEDTIME FOR 7-14 DAYS, Disp: 60 g, Rfl: 11 .  diclofenac Sodium (VOLTAREN) 1 % GEL, diclofenac 1 % topical gel  APPLY TO AFFECTED AREA(S) TOPICALLY EVERY 6 HOURS, Disp: , Rfl:  .  dimethicone (PROSHIELD PLUS SKIN PROTECTANT) 1 % cream, 1 application to the buttock 2 times daily to help protect skin, Disp: 114 g, Rfl: 11 .  Dorzolamide HCl-Timolol Mal (COSOPT OP), Place 1 drop into the right eye 2 (two) times daily. , Disp: , Rfl:  .  feeding supplement, ENSURE ENLIVE, (ENSURE ENLIVE) LIQD, Take 237 mLs by mouth 2 (two) times daily between meals., Disp: 237 mL, Rfl: 12 .  fluticasone (FLONASE) 50 MCG/ACT nasal spray, Place 1 spray into both nostrils daily. Prn max 2 sprays total each nose, Disp: 16 g, Rfl: 11 .  gabapentin (NEURONTIN) 300 MG capsule, Take 1 capsule (300 mg total) by mouth 2 (two) times daily., Disp: 180 capsule, Rfl: 3 .   glipiZIDE (GLUCOTROL) 5 MG tablet, Take 1 tablet (5 mg total) by mouth 2 (two) times daily before a meal., Disp: 180 tablet, Rfl: 3 .  glucose blood test strip, Accu-Chek Aviva Plus test strips, Disp: 300 each, Rfl: 4 .  glucose blood test strip, Bid Use as instructed E11.9, Disp: 200 each, Rfl: 12 .  Lancets (ONETOUCH ULTRASOFT) lancets, Bid E 11.9 Use as instructed, Disp: 200 each, Rfl: 12 .  latanoprost (XALATAN) 0.005 % ophthalmic solution, Place 1 drop into the right eye at bedtime. , Disp: , Rfl:  .  lisinopril (ZESTRIL) 2.5 MG tablet, Take 1 tablet (2.5 mg total) by mouth daily as needed (for BP greater than 130/80)., Disp: ,  Rfl:  .  LORazepam (ATIVAN) 0.5 MG tablet, Take 0.5 mg by mouth every 8 (eight) hours as needed for anxiety., Disp: , Rfl:  .  lovastatin (MEVACOR) 40 MG tablet, Take 1 tablet (40 mg total) by mouth at bedtime., Disp: 90 tablet, Rfl: 3 .  metoprolol succinate (TOPROL-XL) 25 MG 24 hr tablet, Take 1 tablet (25 mg total) by mouth daily., Disp: 90 tablet, Rfl: 3 .  Multiple Vitamin (MULTI-VITAMIN PO), Take 1 tablet by mouth daily., Disp: , Rfl:  .  mupirocin ointment (BACTROBAN) 2 %, Apply 1 application topically 2 (two) times daily. To open sores as needed, Disp: 30 g, Rfl: 2 .  mupirocin ointment (BACTROBAN) 2 %, Apply topically 2 (two) times daily., Disp: 30 g, Rfl: 2 .  Nutritional Supplements (BOOST GLUCOSE CONTROL PO), Take 1 Container by mouth daily., Disp: , Rfl:  .  ONETOUCH DELICA LANCETS 49Z MISC, USE TO TEST BLOOD SUGAR ONCE A DAY, Disp: , Rfl: 0 .  pantoprazole (PROTONIX) 40 MG tablet, Take 1 tablet (40 mg total) by mouth daily. 30 minutes before breakfast OR dinner, Disp: 90 tablet, Rfl: 3 .  pilocarpine (PILOCAR) 4 % ophthalmic solution, Place 1 drop into the right eye 4 (four) times daily. , Disp: , Rfl:  .  polyethylene glycol (MIRALAX) packet, Take 17 g by mouth daily. prn, Disp: 30 each, Rfl: 11 .  Polysacch Fe Complex-Vit D3 (NOVAFERRUM 125) 125-100  MG-UNT/5ML LIQD, Take 125 mg by mouth daily., Disp: 360 mL, Rfl: 11 .  Potassium Chloride ER 20 MEQ TBCR, TAKE 1 TABLET BY MOUTH EVERY DAY WITH FLUID PILL, Disp: , Rfl:  .  senna-docusate (SENOKOT-S) 8.6-50 MG tablet, Take 2 tablets by mouth daily as needed for mild constipation. , Disp: , Rfl:  .  sertraline (ZOLOFT) 50 MG tablet, Take 1 tablet (50 mg total) by mouth daily., Disp: 90 tablet, Rfl: 3 .  sitaGLIPtin (JANUVIA) 50 MG tablet, Take 1 tablet (50 mg total) by mouth daily., Disp: 90 tablet, Rfl: 3 .  Sodium Chloride Flush (NORMAL SALINE FLUSH) 0.9 % SOLN, sodium chloride 0.9 % intravenous solution, Disp: , Rfl:  .  torsemide (DEMADEX) 20 MG tablet, Take 1 tablet (20 mg total) by mouth daily. Take 58m every other day, alternate with 40 mg every other day, Disp: 55 tablet, Rfl: 6 .  traZODone (DESYREL) 100 MG tablet, Take 0.5-1 tablets (50-100 mg total) by mouth at bedtime as needed., Disp: 90 tablet, Rfl: 3 .  triamcinolone cream (KENALOG) 0.1 %, Apply 1 application topically 2 (two) times daily. Prn legs, Disp: 80 g, Rfl: 0 .  vitamin C (ASCORBIC ACID) 500 MG tablet, Take 1 tablet (500 mg total) by mouth daily., Disp: 90 tablet, Rfl: 3 .  Zinc Oxide 10 % OINT, Apply 1 application topically daily as needed (rash). Applied to patient's bottom, Disp: 226.8 g, Rfl: 11 .  Semaglutide,0.25 or 0.5MG/DOS, (OZEMPIC, 0.25 OR 0.5 MG/DOSE,) 2 MG/1.5ML SOPN, Inject 0.25 mg into the skin once a week., Disp: 5 pen, Rfl: 11  EXAM:  VITALS per patient if applicable:  GENERAL: alert, oriented, appears well and in no acute distress  HEENT: atraumatic, conjunttiva clear, no obvious abnormalities on inspection of external nose and ears  NECK: normal movements of the head and neck  LUNGS: on inspection no signs of respiratory distress, breathing rate appears normal, no obvious gross SOB, gasping or wheezing  CV: no obvious cyanosis  MS: moves all visible extremities without noticeable  abnormality  PSYCH/NEURO: pleasant and cooperative, no obvious depression or anxiety, speech and thought processing grossly intact  ASSESSMENT AND PLAN:  Discussed the following assessment and plan:  Type 2 diabetes mellitus with stage 4 chronic kidney disease, without long-term current use of insulin (HCC) - Plan: Semaglutide,0.25 weekly to [place] 50 and glipzide 5 mg bid Hepatic function panel, Lipid panel, Microalbumin / creatinine urine ratio rec avoid sweets and try low sugar foods   Chronic vaginitis - Plan: Urine cytology ancillary only([place])  Anemia, unspecified type - Plan: Iron, TIBC and Ferritin Panel Fatigue, unspecified type - Plan: TSH, iron   Dyspnea on exertion with h/o chronic dCHF, CKD 3/4 -she had appt with cardiology today will review note  Anxiety and depression zoloft 50 mg qd, ativan 0.5 1/2 to 1 pill prn  Daughter will let me know if wants to increase zoloft to 100 mg or make med changes   HM Flu shot utd  Consider pna 23 and Tdap in future  Rx shingrixin the past prevnar utd mmr immune  Pap 05/07/12 negative  Mammogram 04/27/16 negative  DEXA 12/18/06 osteopeniavitamin D 80.01 01/04/18 Colonoscopy 03/04/97 hyperplastic polyps no f/u   -we discussed possible serious and likely etiologies, options for evaluation and workup, limitations of telemedicine visit vs in person visit, treatment, treatment risks and precautions. Pt prefers to treat via telemedicine empirically rather then risking or undertaking an in person visit at this moment. Patient agrees to seek prompt in person care if worsening, new symptoms arise, or if is not improving with treatment.   I discussed the assessment and treatment plan with the patient. The patient was provided an opportunity to ask questions and all were answered. The patient agreed with the plan and demonstrated an understanding of the instructions.   The patient was advised to call back or seek an in-person  evaluation if the symptoms worsen or if the condition fails to improve as anticipated.  Time spent 25 minutes  Delorise Jackson, MD

## 2019-03-13 NOTE — Progress Notes (Signed)
Office Visit    Patient Name: [name] Date of Encounter: 03/13/2019  Primary Care Provider:  McLean-Scocuzza, Nino Glow, MD Primary Cardiologist:  Ida Rogue, MD Electrophysiologist:  None   Chief Complaint    [name] is a 86 y.o. female with a hx of HFpEF, HTN, severe AS s/p TAVR presents today for shortness of breath  Past Medical History    Past Medical History:  Diagnosis Date  . Anemia   . Carotid arterial disease ([place])    a. 08/2014 U/S: Bilateral < 50% stenosis. Patent vertebrals w/ antegrade flow.   . Cervical spine arthritis   . Chronic diastolic CHF (congestive heart failure) ([place])    a. echo 2014: EF 55-60%, DD;  b. 08/2014 Echo: EF 55-60%, no RWMA, GR1DD; c. 02/2016 Echo: EF 65-70%, Gr1 DD.  Marland Kitchen Cognitive impairment   . Depression   . Diabetes mellitus without complication ([place])   . Essential hypertension   . GERD (gastroesophageal reflux disease)   . Glaucoma   . Gout   . History of renal impairment   . Hyperlipidemia   . Hypotension    a. Related to Norvasc - 11/2014 ED visit.  . Multinodular goiter    a. Noted incidentally on CT 07/2012 and carotid U/S 08/2014;  b. Nl TSH 11/2014.  Marland Kitchen Paroxysmal SVT (supraventricular tachycardia) (H[facility])    a. on Toprol   . Prolapsed uterus   . Severe aortic stenosis    a. 11/15/16: s/p TAVR Edwards Sapien 3 THV (size 23 mm, model # 9600TFX, serial # [id number])  . Stroke [facility])    a. CT head 07/2014 showed small thalamic infarct  . Vitamin deficiency    Past Surgical History:  Procedure Laterality Date  . EYE SURGERY     unsure of what procedure, did know that laser was used  . IR RADIOLOGY PERIPHERAL GUIDED IV START  11/03/2016  . IR US GUIDE VASC ACCESS RIGHT  11/03/2016  . RIGHT/LEFT HEART CATH AND CORONARY ANGIOGRAPHY N/A 10/26/2016   Procedure: RIGHT/LEFT HEART CATH AND CORONARY ANGIOGRAPHY;  Surgeon: [name], MD;  Location: [facility];  Service: Cardiovascular;  Laterality:  N/A;  . TEE WITHOUT CARDIOVERSION N/A 03/31/2016   Procedure: TRANSESOPHAGEAL ECHOCARDIOGRAM (TEE);  Surgeon: Minna Merritts, MD;  Location: [facility];  Service: Cardiovascular;  Laterality: N/A;  . TEE WITHOUT CARDIOVERSION N/A 11/15/2016   Procedure: TRANSESOPHAGEAL ECHOCARDIOGRAM (TEE);  Surgeon: [name], MD;  Location: [facility];  Service: Open Heart Surgery;  Laterality: N/A;  . TRANSCATHETER AORTIC VALVE REPLACEMENT, TRANSFEMORAL N/A 11/15/2016   Procedure: TRANSCATHETER AORTIC VALVE REPLACEMENT, TRANSFEMORAL;  Surgeon: [name], MD;  Location: [facility];  Service: Open Heart Surgery;  Laterality: N/A;  . TUBAL LIGATION      Allergies  Allergies  Allergen Reactions  . Metformin Hcl Other (See Comments)    GI upset  . Metformin Other (See Comments), Diarrhea and Nausea And Vomiting  . Metformin And Related Diarrhea    History of Present Illness    [name] is a 86 y.o. female with a hx of DM2, HLD, HTN, CVA, thyroid disease, GERD, anemia, SVT (08/2014), severe AS s/p TAVR 10/2016, HFpEF, lymphedema last seen by heart failure clinic 01/02/19 and by Dr. Rockey Situ 06/19/18.  Cardiac cath 10/26/16 patent coronaries with exception of severe stenosis in R PDA, mild nonobstructive stenosis of the LAD and rCA with patent LM and LCx, mod-severe pulmonary HTN. Echo 03/09/18 LVEF  50-55% with mildly elevated PA pressure 48mHg.   Follows with podiatry and wound care.   Presents today for progressive shortness of breath over the last approximately month.  Her daughter is present today and assists with her medications.  Her daughter tells me she is takes 30 mg of torsemide Sunday through Wednesday and 40 mg Thursday through Saturday.  The utilize this dosing if she needs additional help to go to the restroom in the weekends or when her daughter can offer her more assistance.  The patient does live alone but her daughter lives right across the street.  She sleeps in a recliner and  tells me she does not really like to recline in sleep.  She denies PND.  Reports her lower extremity edema is about her baseline and follows closely with wound care.  She has compression stockings on today.  EKGs/Labs/Other Studies Reviewed:   The following studies were reviewed today:  Echo 03/09/18 Left ventricle: The cavity size was normal. There was moderate   concentric hypertrophy. Systolic function was normal. The   estimated ejection fraction was in the range of 50% to 55%. Wall   motion was normal; there were no regional wall motion   abnormalities. Features are consistent with a pseudonormal left   ventricular filling pattern, with concomitant abnormal relaxation   and increased filling pressure (grade 2 diastolic dysfunction). - Aortic valve: A bioprosthesis ( TAVR) was present and functioning   normally. Mean gradient (S): 15 mm Hg. Valve area (VTI): 1.51   cm^2. - Mitral valve: Calcified annulus. - Left atrium: The atrium was mildly dilated. - Atrial septum: There was an atrial septal aneurysm. - Pulmonary arteries: Systolic pressure was mildly increased. PA   peak pressure: 45 mm Hg (S).  EKG:  EKG is ordered today.  The ekg ordered today demonstrates sinus rhythm 76 bpm with first-degree AV block, left axis deviation and LBBB.  Recent Labs: 04/11/2018: Magnesium 1.9 07/25/2018: B Natriuretic Peptide 95.0 10/23/2018: ALT 12; BUN 17; Creatinine, Ser 1.20; Hemoglobin 11.1; Platelets 163.0; Potassium 3.9; Sodium 139  Recent Lipid Panel    Component Value Date/Time   CHOL 144 04/11/2018 0941   TRIG 119.0 04/11/2018 0941   HDL 41.50 04/11/2018 0941   CHOLHDL 3 04/11/2018 0941   VLDL 23.8 04/11/2018 0941   LDLCALC 79 04/11/2018 0941    Home Medications   Current Meds  Medication Sig  . ACCU-CHEK AVIVA PLUS test strip USE 1 STRIP TO TEST BLOOD SUGARS 3 TIMES A DAY  . acetaminophen (TYLENOL) 500 MG tablet Take 1 tablet (500 mg total) by mouth every 6 (six) hours as  needed for mild pain or headache.  . allopurinol (ZYLOPRIM) 100 MG tablet Take 1 tablet (100 mg total) by mouth daily.  .Marland Kitchenaspirin 81 MG chewable tablet Chew 1 tablet (81 mg total) by mouth daily.  . blood glucose meter kit and supplies Dispense based on patient and insurance preference. Use 1x daily as directed. (FOR ICD-10 E10.9, E11.9).  . brimonidine (ALPHAGAN P) 0.1 % SOLN Place 1 drop into the right eye 2 (two) times daily.  . Carboxymethylcellulose Sodium (ARTIFICIAL TEARS OP) Apply 1 drop to eye 2 (two) times daily. Left eye  . cholecalciferol (VITAMIN D) 25 MCG (1000 UT) tablet Take 1 tablet (1,000 Units total) by mouth daily.  . clotrimazole (LOTRIMIN) 1 % cream APPLY TOPICALLY TO THE AFFECTED AREA ONCE A DAY AT BEDTIME FOR 7-14 DAYS  . diclofenac Sodium (VOLTAREN) 1 % GEL diclofenac  1 % topical gel  APPLY TO AFFECTED AREA(S) TOPICALLY EVERY 6 HOURS  . dimethicone (PROSHIELD PLUS SKIN PROTECTANT) 1 % cream 1 application to the buttock 2 times daily to help protect skin  . Dorzolamide HCl-Timolol Mal (COSOPT OP) Place 1 drop into the right eye 2 (two) times daily.   . feeding supplement, ENSURE ENLIVE, (ENSURE ENLIVE) LIQD Take 237 mLs by mouth 2 (two) times daily between meals.  . fluticasone (FLONASE) 50 MCG/ACT nasal spray Place 1 spray into both nostrils daily. Prn max 2 sprays total each nose  . gabapentin (NEURONTIN) 300 MG capsule Take 1 capsule (300 mg total) by mouth 2 (two) times daily.  Marland Kitchen glipiZIDE (GLUCOTROL) 5 MG tablet Take 1 tablet (5 mg total) by mouth 2 (two) times daily before a meal.  . glucose blood test strip Accu-Chek Aviva Plus test strips  . glucose blood test strip Bid Use as instructed E11.9  . Lancets (ONETOUCH ULTRASOFT) lancets Bid E 11.9 Use as instructed  . latanoprost (XALATAN) 0.005 % ophthalmic solution Place 1 drop into the right eye at bedtime.   Marland Kitchen LORazepam (ATIVAN) 0.5 MG tablet Take 0.5 mg by mouth every 8 (eight) hours as needed for anxiety.  .  lovastatin (MEVACOR) 40 MG tablet Take 1 tablet (40 mg total) by mouth at bedtime.  . metoprolol succinate (TOPROL-XL) 25 MG 24 hr tablet Take 1 tablet (25 mg total) by mouth daily.  . Multiple Vitamin (MULTI-VITAMIN PO) Take 1 tablet by mouth daily.  . mupirocin ointment (BACTROBAN) 2 % Apply 1 application topically 2 (two) times daily. To open sores as needed  . mupirocin ointment (BACTROBAN) 2 % Apply topically 2 (two) times daily.  . Nutritional Supplements (BOOST GLUCOSE CONTROL PO) Take 1 Container by mouth daily.  Glory Rosebush DELICA LANCETS 01U MISC USE TO TEST BLOOD SUGAR ONCE A DAY  . pantoprazole (PROTONIX) 40 MG tablet Take 1 tablet (40 mg total) by mouth daily. 30 minutes before breakfast OR dinner  . pilocarpine (PILOCAR) 4 % ophthalmic solution Place 1 drop into the right eye 4 (four) times daily.   . polyethylene glycol (MIRALAX) packet Take 17 g by mouth daily. prn  . Polysacch Fe Complex-Vit D3 (NOVAFERRUM 125) 125-100 MG-UNT/5ML LIQD Take 125 mg by mouth daily.  . Potassium Chloride ER 20 MEQ TBCR TAKE 1 TABLET BY MOUTH EVERY DAY WITH FLUID PILL  . senna-docusate (SENOKOT-S) 8.6-50 MG tablet Take 2 tablets by mouth daily as needed for mild constipation.   . sertraline (ZOLOFT) 50 MG tablet Take 1 tablet (50 mg total) by mouth daily.  . sitaGLIPtin (JANUVIA) 50 MG tablet Take 1 tablet (50 mg total) by mouth daily.  . Sodium Chloride Flush (NORMAL SALINE FLUSH) 0.9 % SOLN sodium chloride 0.9 % intravenous solution  . torsemide (DEMADEX) 20 MG tablet Take 1 tablet (20 mg total) by mouth daily. Take 60m every other day, alternate with 40 mg every other day  . traZODone (DESYREL) 100 MG tablet Take 0.5-1 tablets (50-100 mg total) by mouth at bedtime as needed.  . triamcinolone cream (KENALOG) 0.1 % Apply 1 application topically 2 (two) times daily. Prn legs  . vitamin C (ASCORBIC ACID) 500 MG tablet Take 1 tablet (500 mg total) by mouth daily.  . Zinc Oxide 10 % OINT Apply 1  application topically daily as needed (rash). Applied to patient's bottom      Review of Systems    Review of Systems  Constitution: Negative for chills, fever  and malaise/fatigue.  Cardiovascular: Positive for dyspnea on exertion, leg swelling and orthopnea. Negative for chest pain, near-syncope, palpitations and syncope.  Respiratory: Positive for shortness of breath. Negative for cough and wheezing.   Gastrointestinal: Negative for nausea and vomiting.  Neurological: Negative for dizziness, light-headedness and weakness.   All other systems reviewed and are otherwise negative except as noted above.  Physical Exam    VS:  BP 110/68 (BP Location: Right Arm, Patient Position: Sitting, Cuff Size: Normal)   Pulse 76   Ht 5' (1.524 m)   Wt 190 lb (86.2 kg)   BMI 37.11 kg/m  , BMI Body mass index is 37.11 kg/m. GEN: Well nourished, overweight, well developed, in no acute distress. HEENT: normal. Neck: Supple, no JVD, carotid bruits, or masses. Cardiac: RRR, no murmurs, rubs, or gallops. No clubbing, cyanosis. Bilateral LE with 2+ pitting edema.  Radials2+ and equal bilaterally. PT 1+ and equal bilaterally. Respiratory:  Respirations regular and unlabored, clear to auscultation bilaterally. GI: Soft, nontender, nondistended.. MS: No deformity or atrophy. Skin: Warm and dry, no rash. Neuro:  Strength and sensation are intact. Psych: Normal affect.  Accessory Clinical Findings    ECG personally reviewed by me today - sinus rhythm 76 bpm with first-degree AV block, left axis deviation and LBBB - no acute changes.  Assessment & Plan    1. HFpEF - NYHA II symptoms.  She is bilateral lower extremity 2+ pitting edema (etiology HFpEF fluid volume versus known lymphedema).  Daughter reports weights go "up and down" at home 184lb-189lb. Takes '30mg'$  Torsemide Sun-Wed and '40mg'$  Thurs-Sat due to her daughter's schedule and availability to assist her to the restroom on days with higher dose.    Recommend additional dose of 40 mg torsemide on Monday or Tuesday.  4-day stretches with only only 30 mg dosing are likely where elevated weights are coming from.  Ideally would do every other day dosing for stable fluid volume, kidney function.  Education provided regarding low-sodium diet, fluid restriction.  Does endorse she sometimes add salt to food.  Recommend elevating lower extremities when sitting.  Continue compression stockings, wraps as directed by wound clinic.  BMP today   2. SOB - Multifactorial HFpEF, pulmonary hypertension, deconditioning.  CBC today to rule out anemia contributed shortness of breath, fatigue.  3. HTN -  BP well controlled. Daughter tells me has not required Lisinopril in >1 month due to BP consistently <130/80. Has had some low normal BP in office and at home without lightheadedness, dizziness. Will switch Lisinopril to PRN for elevated BP >130/80 to reduce risk of falls due to hypotension.   4. SVT - No evidence of recurrence. Continue beta blocker.   5. CAD - Cath 10/26/16 with 50% distal LAD, 40% mid RCA, 80% RPDA - recommended for medical therapy. No anginal symptoms - no indication for ischemic evaluation at this time. Continue GDMT aspirin, beta blocker, statin.   6. Pulmonary HTN - Likely etiology of some of her SOB. Continue Torsemide, as above.   7. Severe AS s/p TAVR - Echo 02/2018 with well functioning valve. Continue SBE prophylaxis for dental procedures.    8. Dm2 - Follows with PCP. Daughter requests A1C to be collected with labs today - will collect and forward to PCP. If additional agent needed, consider addition of SGLT2i for cardioprotective benefit.   Disposition: BMET, CBC today. Follow up in 3 months with HF clinic as previously scheduled. Follow up  in 6 month(s) with Dr. Rockey Situ or  [facility].   Loel Dubonnet, NP 03/13/2019, 9:25 AM

## 2019-03-13 NOTE — Patient Instructions (Signed)
Medication Instructions:  Your physician has recommended you make the following change in your medication:  1- For Torsemide - Try to add on another day of Torsemide 40 mg on Monday or Tuesday, in addition to the 40 mg on Thursday, Friday and Saturday.  2- CHANGE Lisinopril to as needed for blood pressure greater than 130/80.  *If you need a refill on your cardiac medications before your next appointment, please call your pharmacy*  Lab Work: Your physician recommends that you return for lab work in: TODAY - BMET, CBC, Hgb A1c.  If you have labs (blood work) drawn today and your tests are completely normal, you will receive your results only by: Marland Kitchen MyChart Message (if you have MyChart) OR . A paper copy in the mail If you have any lab test that is abnormal or we need to change your treatment, we will call you to review the results.  Testing/Procedures: none  Follow-Up: At [facility], you and your health needs are our priority.  As part of our continuing mission to provide you with exceptional heart care, we have created designated Provider Care Teams.  These Care Teams include your primary Cardiologist (physician) and Advanced Practice Providers (APPs -  Physician Assistants and Nurse Practitioners) who all work together to provide you with the care you need, when you need it.  Your next appointment:   6 month(s)  The format for your next appointment:   In Person  Provider:    You may see Dr Ida Rogue or one of the following Advanced Practice Providers on your designated Care Team:    Murray Hodgkins, NP  Christell Faith, PA-C  Marrianne Mood, PA-C

## 2019-03-14 ENCOUNTER — Telehealth: Payer: Self-pay

## 2019-03-14 ENCOUNTER — Other Ambulatory Visit: Payer: Self-pay

## 2019-03-14 DIAGNOSIS — I5032 Chronic diastolic (congestive) heart failure: Secondary | ICD-10-CM

## 2019-03-14 DIAGNOSIS — N184 Chronic kidney disease, stage 4 (severe): Secondary | ICD-10-CM

## 2019-03-14 LAB — CBC WITH DIFFERENTIAL/PLATELET
Basophils Absolute: 0.1 10*3/uL (ref 0.0–0.2)
Basos: 1 %
EOS (ABSOLUTE): 0.5 10*3/uL — ABNORMAL HIGH (ref 0.0–0.4)
Eos: 6 %
Hematocrit: 33.8 % — ABNORMAL LOW (ref 34.0–46.6)
Hemoglobin: 11.6 g/dL (ref 11.1–15.9)
Immature Grans (Abs): 0.1 10*3/uL (ref 0.0–0.1)
Immature Granulocytes: 1 %
Lymphocytes Absolute: 1.2 10*3/uL (ref 0.7–3.1)
Lymphs: 13 %
MCH: 30.8 pg (ref 26.6–33.0)
MCHC: 34.3 g/dL (ref 31.5–35.7)
MCV: 90 fL (ref 79–97)
Monocytes Absolute: 0.6 10*3/uL (ref 0.1–0.9)
Monocytes: 7 %
Neutrophils Absolute: 6.2 10*3/uL (ref 1.4–7.0)
Neutrophils: 72 %
Platelets: 183 10*3/uL (ref 150–450)
RBC: 3.77 x10E6/uL (ref 3.77–5.28)
RDW: 13.7 % (ref 11.7–15.4)
WBC: 8.6 10*3/uL (ref 3.4–10.8)

## 2019-03-14 LAB — BASIC METABOLIC PANEL
BUN/Creatinine Ratio: 15 (ref 12–28)
BUN: 22 mg/dL (ref 10–36)
CO2: 27 mmol/L (ref 20–29)
Calcium: 9.6 mg/dL (ref 8.7–10.3)
Chloride: 96 mmol/L (ref 96–106)
Creatinine, Ser: 1.51 mg/dL — ABNORMAL HIGH (ref 0.57–1.00)
GFR calc Af Amer: 34 mL/min/{1.73_m2} — ABNORMAL LOW (ref >59)
GFR calc non Af Amer: 30 mL/min/{1.73_m2} — ABNORMAL LOW (ref >59)
Glucose: 207 mg/dL — ABNORMAL HIGH (ref 65–99)
Potassium: 4.6 mmol/L (ref 3.5–5.2)
Sodium: 139 mmol/L (ref 134–144)

## 2019-03-14 LAB — HEMOGLOBIN A1C
Est. average glucose Bld gHb Est-mCnc: 192 mg/dL
Hgb A1c MFr Bld: 8.3 % — ABNORMAL HIGH (ref 4.8–5.6)

## 2019-03-14 MED ORDER — TORSEMIDE 20 MG PO TABS: ORAL_TABLET | ORAL | Status: DC

## 2019-03-14 NOTE — Telephone Encounter (Signed)
-----   Message from Loel Dubonnet, NP sent at 03/14/2019 10:54 AM EST ----- Normal electrolytes. Stable CBC. Labs do show slight decline in kidney function.   Does she follow with nephrology? If so, would like her to see them. If not, please refer.Make sure she is avoiding ibuprofen, aleve. Would like to recheck BMET in 1 week to reassess. Would recommend doing the Torsemide 30mg  and 40mg  on alternating days if at all possible. This will help protect kidney function.They currently are doing Torsemide 40mg  Thurs-Sat and 30mg  Sun-Wed. IF they are unable to do alternating days. Please do 40mg  Thursday and Saturday, but 30mg  Friday this week.

## 2019-03-14 NOTE — Telephone Encounter (Signed)
DPR on file. Called to give lab results and Laurann Montana, NP recommendation. lmtcb for the patient's daughter Zebedee Iba.

## 2019-03-14 NOTE — Telephone Encounter (Addendum)
Patients daughter [name] made aware of results and Laurann Montana, NP recommendation. Torsemide is to be alternated taking 30mg  and 40mg  alternating days. Bmet in 1 [facility]. Referral for Nephrology placed to [facility]. Advised [name] that their office should contact them directly to schedule.  Advised [name] that the results for the patients HgA1c has been fwd to the pt pcps office to be addressed and that they should be contactedby them with their recommendation.  [name] verbalized understanding to the instructions given.

## 2019-03-14 NOTE — Addendum Note (Signed)
Addended by: Orland Mustard on: 03/14/2019 11:37 AM   Modules accepted: Orders

## 2019-03-15 ENCOUNTER — Telehealth: Payer: Self-pay | Admitting: Internal Medicine

## 2019-03-15 NOTE — Telephone Encounter (Signed)
[facility] called and stated that pt needs home health to assist with ADL but they do not do skilled nursing so please do not send to [facility]. Thanks

## 2019-03-18 ENCOUNTER — Other Ambulatory Visit: Payer: PPO

## 2019-03-18 NOTE — Telephone Encounter (Signed)
Patient's consult with Nephrology scheduled with [facility] on 04/29/19 @ 12:20pm with Dr. Juleen China.

## 2019-03-18 NOTE — Telephone Encounter (Signed)
Called and spoke to Pt daughter and she agreed with H/H services.

## 2019-03-18 NOTE — Telephone Encounter (Signed)
Order placed H/H services  Inform daughter crystal  Does she agree ? this is per [facility]   [place]

## 2019-03-18 NOTE — Addendum Note (Signed)
Addended by: Orland Mustard on: 03/18/2019 01:21 AM   Modules accepted: Orders

## 2019-03-20 DIAGNOSIS — Z20828 Contact with and (suspected) exposure to other viral communicable diseases: Secondary | ICD-10-CM | POA: Diagnosis not present

## 2019-03-29 ENCOUNTER — Other Ambulatory Visit: Payer: PPO

## 2019-04-01 ENCOUNTER — Telehealth: Payer: Self-pay | Admitting: Internal Medicine

## 2019-04-01 NOTE — Telephone Encounter (Signed)
Pt daughter called about needing a refill for a yeast infection not sure of the name. Please advise and thank you!  Pharmacy is [id number] - [place], [place]  Call [place] daughter @ [phone].

## 2019-04-02 ENCOUNTER — Other Ambulatory Visit: Payer: Self-pay | Admitting: Internal Medicine

## 2019-04-02 ENCOUNTER — Other Ambulatory Visit: Payer: Self-pay | Admitting: *Deleted

## 2019-04-02 DIAGNOSIS — B373 Candidiasis of vulva and vagina: Secondary | ICD-10-CM

## 2019-04-02 DIAGNOSIS — B3731 Acute candidiasis of vulva and vagina: Secondary | ICD-10-CM

## 2019-04-02 MED ORDER — FLUCONAZOLE 150 MG PO TABS: 150.0000 mg | ORAL_TABLET | Freq: Once | ORAL | 0 refills | Status: AC

## 2019-04-02 NOTE — Telephone Encounter (Signed)
Last prescribed Diflucan 150mg  last month.

## 2019-04-02 NOTE — Telephone Encounter (Signed)
Please make sure pt leaves dirty and clean urine with next labs I believe 04/17/19  Inform daughter [name]   [facility]

## 2019-04-03 ENCOUNTER — Encounter: Payer: Self-pay | Admitting: *Deleted

## 2019-04-03 NOTE — Telephone Encounter (Signed)
Sent mychart message

## 2019-04-04 ENCOUNTER — Other Ambulatory Visit: Payer: Self-pay | Admitting: Internal Medicine

## 2019-04-04 ENCOUNTER — Telehealth: Payer: Self-pay | Admitting: Internal Medicine

## 2019-04-04 DIAGNOSIS — I10 Essential (primary) hypertension: Secondary | ICD-10-CM

## 2019-04-04 MED ORDER — LISINOPRIL 2.5 MG PO TABS: 2.5000 mg | ORAL_TABLET | Freq: Every day | ORAL | 3 refills | Status: DC | PRN

## 2019-04-04 NOTE — Telephone Encounter (Signed)
Pt's daughter called and said they picked up lisinopril from pharmacy and she has questions about the medicine. She is requesting a call back.

## 2019-04-04 NOTE — Telephone Encounter (Signed)
[facility] to call & cancel. I also called to inform [name] patient's daughter.

## 2019-04-04 NOTE — Telephone Encounter (Signed)
Called Pt back and spoke with her daughter. Pt daughter stated her mom has not taken Lisinopril for awhile now, and the pharmacy refilled it. Daughter also stated last month her mom went to the heart doctor and they agreed her not taking it because her BP was stable. Pt daughter wants to know if she wanted her to start back on it.

## 2019-04-04 NOTE — Telephone Encounter (Signed)
Ok call pharmacy to D/C lis 2.5 mg qd  Sorry about this they sent me a refill  Inform daughter crystal   [place]

## 2019-04-10 ENCOUNTER — Telehealth: Payer: Self-pay | Admitting: Cardiovascular Disease

## 2019-04-10 NOTE — Telephone Encounter (Signed)
Recommend returning to Torsemide alternating days of 40mg  and 30mg . Likely volume overload is causing her shortness of breath.  There is some documentation that she has home health services by PCP. Does she have home health coming out? If so, we may be able to call them to request vitals, labs (BMET, BNP), and a nurse's assessment.   If not, likely will benefit from being seen in the office for a [place], NP

## 2019-04-10 NOTE — Telephone Encounter (Signed)
I spoke with the patient's daughter, [name]. Per [employer], the patient typically stays at her house, but has been staying with her sister for a week since she was sick.  The patient's sister called her periodically through the week to update her on her mom. The patient has been reporting increase SOB and feelings of her air being cut off.  The patient has been more fatigued. She was walking down the hall to go to the bathroom, but would have to stop to rest on the way back. Since she has been at there sister's house, they have moved a potty chair in the room and she is still complaining of fatigue with this little movement.   [name] advised since the patient was wetting through some of her clothes and her swelling was down, she has been getting torsemide 30 mg daily since last Thursday- this was the last day she received 40 mg of torsemide.  [name] did not ask her sister this morning if they have been weighing the patient, but her weights were stable prior to moving.  [name] did go see the patient today. She states her legs still look pretty good, but they cannot elevate them as much as they were because the patient feels like her air is being cut off when she lays back.  The patient is also stating she feels like her breathing is moving her "forward'- it sounds as though she is trying to breathe more deeply to feel like she is getting enough air.   The patient is not on any O2 at this time.   I have advised I will need to review the above information with Laurann Montana, NP and call her back with any further recommendations.   [name] voices understanding and is agreeable.

## 2019-04-10 NOTE — Telephone Encounter (Signed)
Pt c/o Shortness Of Breath: STAT if SOB developed within the last 24 hours or pt is noticeably SOB on the phone  1. Are you currently SOB (can you hear that pt is SOB on the phone)? Unknown daughter calling   2. How long have you been experiencing SOB? 2 months getting worse now   3. Are you SOB when sitting or when up moving around?  Laying down in chair or on exertion increased from issue before   4. Are you currently experiencing any other symptoms? Lower than normal bp 100/65 hr 63

## 2019-04-11 ENCOUNTER — Other Ambulatory Visit: Payer: Self-pay

## 2019-04-11 ENCOUNTER — Encounter: Payer: Self-pay | Admitting: Family

## 2019-04-11 ENCOUNTER — Ambulatory Visit: Payer: PPO | Admitting: Family

## 2019-04-11 ENCOUNTER — Other Ambulatory Visit: Payer: Self-pay | Admitting: Family

## 2019-04-11 ENCOUNTER — Ambulatory Visit
Admission: RE | Admit: 2019-04-11 | Discharge: 2019-04-11 | Disposition: A | Discharge status: Home / Self Care | Payer: PPO | Source: Ambulatory Visit | Attending: Family | Admitting: Family

## 2019-04-11 VITALS — BP 151/71 | HR 70 | Resp 16 | Ht 60.0 in | Wt 185.0 lb

## 2019-04-11 DIAGNOSIS — Z8673 Personal history of transient ischemic attack (TIA), and cerebral infarction without residual deficits: Secondary | ICD-10-CM | POA: Diagnosis not present

## 2019-04-11 DIAGNOSIS — R0602 Shortness of breath: Secondary | ICD-10-CM | POA: Diagnosis not present

## 2019-04-11 DIAGNOSIS — K219 Gastro-esophageal reflux disease without esophagitis: Secondary | ICD-10-CM | POA: Diagnosis not present

## 2019-04-11 DIAGNOSIS — F329 Major depressive disorder, single episode, unspecified: Secondary | ICD-10-CM | POA: Insufficient documentation

## 2019-04-11 DIAGNOSIS — I272 Pulmonary hypertension, unspecified: Secondary | ICD-10-CM | POA: Insufficient documentation

## 2019-04-11 DIAGNOSIS — E785 Hyperlipidemia, unspecified: Secondary | ICD-10-CM | POA: Insufficient documentation

## 2019-04-11 DIAGNOSIS — Z79899 Other long term (current) drug therapy: Secondary | ICD-10-CM | POA: Insufficient documentation

## 2019-04-11 DIAGNOSIS — I110 Hypertensive heart disease with heart failure: Secondary | ICD-10-CM | POA: Insufficient documentation

## 2019-04-11 DIAGNOSIS — I5033 Acute on chronic diastolic (congestive) heart failure: Secondary | ICD-10-CM | POA: Insufficient documentation

## 2019-04-11 DIAGNOSIS — I890 Lymphedema, not elsewhere classified: Secondary | ICD-10-CM | POA: Insufficient documentation

## 2019-04-11 DIAGNOSIS — G479 Sleep disorder, unspecified: Secondary | ICD-10-CM | POA: Insufficient documentation

## 2019-04-11 DIAGNOSIS — Z794 Long term (current) use of insulin: Secondary | ICD-10-CM | POA: Insufficient documentation

## 2019-04-11 DIAGNOSIS — D649 Anemia, unspecified: Secondary | ICD-10-CM | POA: Insufficient documentation

## 2019-04-11 DIAGNOSIS — Z7982 Long term (current) use of aspirin: Secondary | ICD-10-CM | POA: Insufficient documentation

## 2019-04-11 DIAGNOSIS — E119 Type 2 diabetes mellitus without complications: Secondary | ICD-10-CM | POA: Diagnosis not present

## 2019-04-11 DIAGNOSIS — Z7901 Long term (current) use of anticoagulants: Secondary | ICD-10-CM | POA: Insufficient documentation

## 2019-04-11 DIAGNOSIS — E1122 Type 2 diabetes mellitus with diabetic chronic kidney disease: Secondary | ICD-10-CM

## 2019-04-11 DIAGNOSIS — I350 Nonrheumatic aortic (valve) stenosis: Secondary | ICD-10-CM | POA: Insufficient documentation

## 2019-04-11 DIAGNOSIS — R5383 Other fatigue: Secondary | ICD-10-CM | POA: Insufficient documentation

## 2019-04-11 DIAGNOSIS — M109 Gout, unspecified: Secondary | ICD-10-CM | POA: Insufficient documentation

## 2019-04-11 DIAGNOSIS — I10 Essential (primary) hypertension: Secondary | ICD-10-CM

## 2019-04-11 DIAGNOSIS — E079 Disorder of thyroid, unspecified: Secondary | ICD-10-CM | POA: Insufficient documentation

## 2019-04-11 LAB — BASIC METABOLIC PANEL
Anion gap: 10 (ref 5–15)
BUN: 30 mg/dL — ABNORMAL HIGH (ref 8–23)
CO2: 29 mmol/L (ref 22–32)
Calcium: 9.5 mg/dL (ref 8.9–10.3)
Chloride: 99 mmol/L (ref 98–111)
Creatinine, Ser: 1.92 mg/dL — ABNORMAL HIGH (ref 0.44–1.00)
GFR calc Af Amer: 26 mL/min — ABNORMAL LOW (ref >60)
GFR calc non Af Amer: 22 mL/min — ABNORMAL LOW (ref >60)
Glucose, Bld: 247 mg/dL — ABNORMAL HIGH (ref 70–99)
Potassium: 4.9 mmol/L (ref 3.5–5.1)
Sodium: 138 mmol/L (ref 135–145)

## 2019-04-11 LAB — BRAIN NATRIURETIC PEPTIDE: B Natriuretic Peptide: 678 pg/mL — ABNORMAL HIGH (ref 0.0–100.0)

## 2019-04-11 MED ORDER — POTASSIUM CHLORIDE CRYS ER 20 MEQ PO TBCR
EXTENDED_RELEASE_TABLET | ORAL | Status: AC
  Administered 2019-04-11: 13:00:00 40 meq via ORAL
  Filled 2019-04-11: qty 2

## 2019-04-11 MED ORDER — FUROSEMIDE 10 MG/ML IJ SOLN
INTRAMUSCULAR | Status: AC
  Administered 2019-04-11: 12:00:00 80 mg via INTRAVENOUS
  Filled 2019-04-11: qty 8

## 2019-04-11 MED ORDER — FUROSEMIDE 10 MG/ML IJ SOLN: 80.0000 mg | Freq: Once | INTRAMUSCULAR | Status: AC

## 2019-04-11 MED ORDER — POTASSIUM CHLORIDE CRYS ER 20 MEQ PO TBCR: 40.0000 meq | EXTENDED_RELEASE_TABLET | Freq: Once | ORAL | Status: AC

## 2019-04-11 NOTE — Progress Notes (Signed)
Patient ID: [name], female    DOB: Dec 13, 1924, 86 y.o.   MRN: [id number]  HPI  [name] is a 86 y/o female with a history of Dm, hyperlipidemia, HTN, stroke, thyroid disease, GERD, anemia, SVT, severe aortic stenosis and chronic heart failure.   Echo report from 03/09/2018 reviewed and showed an EF of 50-55% along with mildly elevated PA pressure of 43m Hg.   Catheterization done 10/26/16 reviewed and showed: Patent coronary arteries with the exception of severe stenosis in the right PDA. There is mild nonobstructive stenosis of the LAD and RCA and a widely patent left main and left circumflex 2. Moderate to severe pulmonary hypertension with pulmonary artery pressure of 74/21 with a mean of 45 and a pulmonary capillary wedge pressure of 23 with a 32 mmHg T-wave 3. Mean transaortic valve gradient of 17 mmHg and calculated aortic valve area of 1.0 cm, suspect contamination of the pressure gradient with use of an AL-1 catheter and difficult measurement because of frequent ectopy  Has not been admitted or been in the ED in the last 6 months.   She presents today for an acute visit with a chief complaint of worsening shortness of breath. Daughter that is present with her says it's been worsening over about the last week. She has associated fatigue and difficulty sleeping along with this. Daughter says that patient has been having to sit up at night to sleep because she can't catch her breath. Patient has been staying with another daughter and isn't sure about her weight. Edema is worsening and currently she has her legs wrapped.  Hasn't been taking 34mtosemide QOD with 4011morsemide.   Past Medical History:  Diagnosis Date  . Anemia   . Car[place]id arterial disease ([facility]  a. 08/2014 U/S: Bilateral < 50% stenosis. Patent vertebrals w/ antegrade flow.   . Cervical spine arthritis   . Chronic diastolic CHF (congestive heart failure) ([facility]  a. echo 2014: EF 55-60%, DD;  b.  08/2014 Echo: EF 55-60%, no RWMA, GR1DD; c. 02/2016 Echo: EF 65-70%, Gr1 DD.  . CMarland Kitchengnitive impairment   . Depression   . Diabetes mellitus without complication ([facility] . Essential hypertension   . GERD (gastroesophageal reflux disease)   . Glaucoma   . Gout   . History of renal impairment   . Hyperlipidemia   . Hypotension    a. Related to Norvasc - 11/2014 ED visit.  . Multinodular goiter    a. Noted incidentally on CT 07/2012 and carotid U/S 08/2014;  b. Nl TSH 11/2014.  . PMarland Kitchenroxysmal SVT (supraventricular tachycardia) (HCC)    a. on Toprol   . Prolapsed uterus   . Severe aortic stenosis    a. 11/15/16: s/p TAVR Edwards Sapien 3 THV (size 23 mm, model # 9600TFX, serial # [id number]. [facility]  a. CT head 07/2014 showed small thalamic infarct  . Vitamin deficiency    Past Surgical History:  Procedure Laterality Date  . EYE SURGERY     unsure of what procedure, did know that laser was used  . IR RADIOLOGY PERIPHERAL GUIDED IV START  11/03/2016  . IR US [place] VASC ACCESS RIGHT  11/03/2016  . RIGHT/LEFT HEART CATH AND CORONARY ANGIOGRAPHY N/A 10/26/2016   Procedure: RIGHT/LEFT HEART CATH AND CORONARY ANGIOGRAPHY;  Surgeon: CooSherren MochaD;  Location: [facility];  Service: Cardiovascular;  Laterality: N/A;  . TEE WITHOUT CARDIOVERSION N/A 03/31/2016  Procedure: TRANSESOPHAGEAL ECHOCARDIOGRAM (TEE);  Surgeon: Minna Merritts, MD;  Location: [facility];  Service: Cardiovascular;  Laterality: N/A;  . TEE WITHOUT CARDIOVERSION N/A 11/15/2016   Procedure: TRANSESOPHAGEAL ECHOCARDIOGRAM (TEE);  Surgeon: [name], MD;  Location: [place];  Service: Open Heart Surgery;  Laterality: N/A;  . TRANSCATHETER AORTIC VALVE REPLACEMENT, TRANSFEMORAL N/A 11/15/2016   Procedure: TRANSCATHETER AORTIC VALVE REPLACEMENT, TRANSFEMORAL;  Surgeon: [name], MD;  Location: [place];  Service: Open Heart Surgery;  Laterality: N/A;  . TUBAL LIGATION     Family History  Problem Relation Age of Onset   . Glaucoma Mother   . Diabetes Mother   . Peptic Ulcer Father   . Colitis Father    Social History   Tobacco Use  . Smoking status: Never Smoker  . Smokeless tobacco: Never Used  Substance Use Topics  . Alcohol use: No   Allergies  Allergen Reactions  . Metformin Hcl Other (See Comments)    GI upset  . Metformin Other (See Comments), Diarrhea and Nausea And Vomiting  . Metformin And Related Diarrhea   Prior to Admission medications   Medication Sig Start Date End Date Taking? Authorizing Provider  ACCU-CHEK AVIVA PLUS test strip USE 1 STRIP TO TEST BLOOD SUGARS 3 TIMES A DAY 04/15/18  Yes McLean-Scocuzza, Nino Glow, MD  acetaminophen (TYLENOL) 500 MG tablet Take 1 tablet (500 mg total) by mouth every 6 (six) hours as needed for mild pain or headache. 04/15/18  Yes McLean-Scocuzza, Nino Glow, MD  allopurinol (ZYLOPRIM) 100 MG tablet Take 1 tablet (100 mg total) by mouth daily. 04/15/18  Yes McLean-Scocuzza, Nino Glow, MD  aspirin 81 MG chewable tablet Chew 1 tablet (81 mg total) by mouth daily. 04/15/18  Yes McLean-Scocuzza, Nino Glow, MD  blood glucose meter kit and supplies Dispense based on patient and insurance preference. Use 1x daily as directed. (FOR ICD-10 E10.9, E11.9). 11/30/17  Yes McLean-Scocuzza, Nino Glow, MD  brimonidine (ALPHAGAN P) 0.1 % SOLN Place 1 drop into the right eye 2 (two) times daily.   Yes [provider]  Carboxymethylcellulose Sodium (ARTIFICIAL TEARS OP) Apply 1 drop to eye 2 (two) times daily. Left eye   Yes [provider]  cholecalciferol (VITAMIN D) 25 MCG (1000 UT) tablet Take 1 tablet (1,000 Units total) by mouth daily. 04/15/18  Yes McLean-Scocuzza, Nino Glow, MD  clotrimazole (LOTRIMIN) 1 % cream APPLY TOPICALLY TO THE AFFECTED AREA ONCE A DAY AT BEDTIME FOR 7-14 DAYS 04/15/18  Yes McLean-Scocuzza, Nino Glow, MD  diclofenac Sodium (VOLTAREN) 1 % GEL diclofenac 1 % topical gel  APPLY TO AFFECTED AREA(S) TOPICALLY EVERY 6 HOURS   Yes [provider]  dimethicone (PROSHIELD PLUS SKIN PROTECTANT) 1 % cream 1 application to the buttock 2 times daily to help protect skin 04/15/18  Yes McLean-Scocuzza, Nino Glow, MD  Dorzolamide HCl-Timolol Mal (COSOPT OP) Place 1 drop into the right eye 2 (two) times daily.    Yes [provider]  feeding supplement, ENSURE ENLIVE, (ENSURE ENLIVE) LIQD Take 237 mLs by mouth 2 (two) times daily between meals. 04/01/16  Yes Fritzi Mandes, MD  fluticasone (FLONASE) 50 MCG/ACT nasal spray Place 1 spray into both nostrils daily. Prn max 2 sprays total each nose 04/15/18  Yes McLean-Scocuzza, Nino Glow, MD  gabapentin (NEURONTIN) 300 MG capsule Take 1 capsule (300 mg total) by mouth 2 (two) times daily. 04/15/18  Yes McLean-Scocuzza, Nino Glow, MD  glipiZIDE (GLUCOTROL) 5 MG tablet Take 1 tablet (5  mg total) by mouth 2 (two) times daily before a meal. 11/01/18  Yes McLean-Scocuzza, Nino Glow, MD  glucose blood test strip Accu-Chek Aviva Plus test strips 04/15/18  Yes McLean-Scocuzza, Nino Glow, MD  glucose blood test strip Bid Use as instructed E11.9 12/28/18  Yes McLean-Scocuzza, Nino Glow, MD  Insulin Pen Needle (PEN NEEDLES) 30G X 8 MM MISC 1 Device by Does not apply route once a week. 03/13/19  Yes McLean-Scocuzza, Nino Glow, MD  Lancets [facility] ULTRASOFT) lancets Bid E 11.9 Use as instructed 12/28/18  Yes McLean-Scocuzza, Nino Glow, MD  latanoprost (XALATAN) 0.005 % ophthalmic solution Place 1 drop into the right eye at bedtime.    Yes [provider]  LORazepam (ATIVAN) 0.5 MG tablet Take 0.5 mg by mouth every 8 (eight) hours as needed for anxiety.   Yes [provider]  lovastatin (MEVACOR) 40 MG tablet Take 1 tablet (40 mg total) by mouth at bedtime. 04/15/18  Yes McLean-Scocuzza, Nino Glow, MD  metoprolol succinate (TOPROL-XL) 25 MG 24 hr tablet Take 1 tablet (25 mg total) by mouth daily. 04/15/18  Yes McLean-Scocuzza, Nino Glow, MD  Multiple Vitamin (MULTI-VITAMIN PO) Take 1 tablet by mouth daily.    Yes [provider]  mupirocin ointment (BACTROBAN) 2 % Apply 1 application topically 2 (two) times daily. To open sores as needed 07/26/18  Yes McLean-Scocuzza, Nino Glow, MD  mupirocin ointment (BACTROBAN) 2 % Apply topically 2 (two) times daily. 11/16/18  Yes McLean-Scocuzza, Nino Glow, MD  [facility] DELICA LANCETS 04U MISC USE TO TEST BLOOD SUGAR ONCE A DAY 11/30/17  Yes [provider]  pantoprazole (PROTONIX) 40 MG tablet Take 1 tablet (40 mg total) by mouth daily. 30 minutes before breakfast OR dinner 04/15/18  Yes McLean-Scocuzza, Nino Glow, MD  pilocarpine (PILOCAR) 4 % ophthalmic solution Place 1 drop into the right eye 4 (four) times daily.    Yes [provider]  polyethylene glycol (MIRALAX) packet Take 17 g by mouth daily. prn 04/15/18  Yes McLean-Scocuzza, Nino Glow, MD  Polysacch Fe Complex-Vit D3 (NOVAFERRUM 125) 125-100 MG-UNT/5ML LIQD Take 125 mg by mouth daily. 12/06/18  Yes McLean-Scocuzza, Nino Glow, MD  Potassium Chloride ER 20 MEQ TBCR TAKE 1 TABLET BY MOUTH EVERY DAY WITH FLUID PILL 07/30/18  Yes [provider]  Semaglutide,0.25 or 0.5MG/DOS, (OZEMPIC, 0.25 OR 0.5 MG/DOSE,) 2 MG/1.5ML SOPN Inject 0.25 mg into the skin once a week. 03/13/19  Yes McLean-Scocuzza, Nino Glow, MD  sertraline (ZOLOFT) 50 MG tablet Take 1 tablet (50 mg total) by mouth daily. 04/15/18  Yes McLean-Scocuzza, Nino Glow, MD  sitaGLIPtin (JANUVIA) 50 MG tablet Take 1 tablet (50 mg total) by mouth daily. 04/15/18  Yes McLean-Scocuzza, Nino Glow, MD  torsemide (DEMADEX) 20 MG tablet Take 32m every other day, alternate with 40 mg every other day 03/14/19  Yes WLoel Dubonnet NP  traZODone (DESYREL) 100 MG tablet Take 0.5-1 tablets (50-100 mg total) by mouth at bedtime as needed. 04/15/18  Yes McLean-Scocuzza, TNino Glow MD  triamcinolone cream (KENALOG) 0.1 % Apply 1 application topically 2 (two) times daily. Prn legs 08/16/18  Yes McLean-Scocuzza, TNino Glow MD  vitamin C (ASCORBIC ACID) 500 MG tablet  Take 1 tablet (500 mg total) by mouth daily. 04/15/18  Yes McLean-Scocuzza, TNino Glow MD  Zinc Oxide 10 % OINT Apply 1 application topically daily as needed (rash). Applied to patient's bottom 04/15/18  Yes McLean-Scocuzza, TNino Glow MD  Sodium Chloride Flush (NORMAL SALINE FLUSH) 0.9 % SOLN  sodium chloride 0.9 % intravenous solution    [provider]    Review of Systems  Constitutional: Positive for fatigue (easily). Negative for appetite change.  HENT: Negative for congestion, postnasal drip and sore throat.   Eyes: Negative.   Respiratory: Positive for shortness of breath (worsening). Negative for chest tightness.   Cardiovascular: Negative for chest pain, palpitations and leg swelling.  Gastrointestinal: Negative for abdominal distention and abdominal pain.  Endocrine: Negative.   Genitourinary: Negative.   Musculoskeletal: Positive for arthralgias (left knee). Negative for back pain and neck pain.  Skin: Negative.   Allergic/Immunologic: Negative.   Neurological: Negative for dizziness and light-headedness.  Hematological: Negative for adenopathy. Does not bruise/bleed easily.  Psychiatric/Behavioral: Positive for sleep disturbance (sleeps in recliner). Negative for dysphoric mood. The patient is not nervous/anxious.    Vitals:   04/11/19 1113  BP: (!) 151/71  Pulse: 70  Resp: 16  SpO2: 100%  Weight: 185 lb (83.9 kg)  Height: 5' (1.524 m)   Wt Readings from Last 3 Encounters:  04/11/19 185 lb (83.9 kg)  03/13/19 190 lb (86.2 kg)  03/13/19 190 lb (86.2 kg)   Lab Results  Component Value Date   CREATININE 1.51 (H) 03/13/2019   CREATININE 1.20 10/23/2018   CREATININE 1.22 (H) 07/25/2018    Physical Exam Vitals and nursing note reviewed.  Constitutional:      Appearance: Normal appearance.  HENT:     Head: Normocephalic and atraumatic.  Cardiovascular:     Rate and Rhythm: Normal rate and regular rhythm.  Pulmonary:     Effort: Pulmonary effort is normal. No  respiratory distress.     Breath sounds: Examination of the right-lower field reveals rales. Examination of the left-lower field reveals rales. Rales present. No wheezing or rhonchi.  Abdominal:     General: There is no distension.     Palpations: Abdomen is soft.  Musculoskeletal:        General: No tenderness.     Cervical back: Normal range of motion and neck supple.     Right lower leg: Edema (1+ pitting) present.     Left lower leg: Edema (1+ pitting) present.  Skin:    General: Skin is warm and dry.  Neurological:     General: No focal deficit present.     Mental Status: She is alert and oriented to person, place, and time.  Psychiatric:        Mood and Affect: Mood normal.        Behavior: Behavior normal.    Assessment & Plan:  1: Acute on Chronic heart failure with preserved ejection fraction- - NYHA class III - fluid overloaded today with worsening symptoms - not weighing daily as she was staying with another daughter; encouraged them to resume and to call for an overnight weight gain of >2 pounds or a weekly weight gain of >5 pounds - weight down 4 pounds since last visit 3 months ago - not adding salt to her food except a "sprinkle" when eating seafood.  - limited in diuretic use due to renal function - will send patient for 46m IV lasix/ 452mPO potassium today - will get BMP/BNP today and will probably need to recheck it next week - saw cardiology (WGilford Rile1/13/21 - BNP 07/25/2018 was 95.0 - has received flu vaccine this season. - consider palliative care consult  2: HTN- - BP mildly elevated - saw PCP (McLean-Scocuzza) 03/13/19 - BMP from 03/13/19 reviewed and showed sodium 139,  potassium 4.6, creatinine 1.51 and GFR 34  3: DM- - A1c 03/13/19 was 8.3% - patient checks blood sugar daily, unable to remember the number this morning.   4: Lymphedema- - stage 2 - saw vascular 08/23/2018 - elevating her legs "at times"; encouraged her to elevate her legs when  sitting for long periods of time - has been wearing support socks daily & currently has both lower legs wrapped - limited in her ability to exercise due to her shortness of breath - had been wearing compression boots but hasn't worn them much since at her other daughter's house.   Patient did not bring medications nor a list. Each medication was verbally reviewed with the patient and was encouraged to bring the bottles to every visit to confirm accuracy of the list.   Return next week for a recheck of symptoms; sooner if needed

## 2019-04-11 NOTE — Telephone Encounter (Signed)
Secure chat sent to Darylene Price, NP in the CHF clinic this morning to see if they could add the patient on for further evaluation.  Per Otila Kluver, they will call the patient's daughter for an appointment today.  The patient is currently scheduled at 11:00 am today in the CHF clinic.

## 2019-04-11 NOTE — Telephone Encounter (Signed)
Late entry: I spoke with the patient's daughter Veterinary surgeon) after 5 pm yesterday afternoon. I advised her of Urban Gibson NP's recommendations to:  1) Resume torsemide 40 mg every other day alternating with 30 mg every other day  - confirmed with the daughter the patient does not currently have active home health services  - I advised the patient should be evaluated in person for an assessment of her fluid status and lab work.  I advised [name] I would need to call her back tomorrow to confirm an appointment date/ time.  Per [name], her sister was texting her at the time of my call stating the patient feels short winded.  I advised [name] if the patient starts to decline over night, they should go ahead and take her to the ER for further evaluation. [name] asked if oxygen could be prescribed for the patient, but I advised this would require some documentation of her O2 sats in office prior to insurance paying for this.   [name] voiced understanding of the above and was agreeable with a call back today (Thursday).

## 2019-04-11 NOTE — Patient Instructions (Signed)
Continue weighing daily and call for an overnight weight gain of > 2 pounds or a weekly weight gain of >5 pounds. 

## 2019-04-12 ENCOUNTER — Telehealth: Payer: Self-pay | Admitting: Internal Medicine

## 2019-04-12 NOTE — Telephone Encounter (Signed)
Pt's daughter called and has a few questions about glipiZIDE (GLUCOTROL) 5 MG tablet. Pt would not give me the message-states that it is too long to tell twice.

## 2019-04-12 NOTE — Telephone Encounter (Signed)
Daughter states she was going through her mother's medication and states the patient was changed from a half a pill to a whole pill once a day.   The daughter states that she was supposed to give the patient a whole pill twice a day with the increase but never did. States patient's blood sugar has been running the 150's fasting.   The daughter wants to know, with adding the injection (Ozempic), Does Dr Olivia Mackie want the patient to start taking Glipizide twice a day?

## 2019-04-15 NOTE — Telephone Encounter (Signed)
With her moms last kidney function lab result  The safest option for treating diabetes would be to reduce glipizde to 2.5 mg daily total, reduce januvia to 25 mg daily total  AND to above    Add sliding scale insulin to use as needed for elevated blood sugars this would be injections up to 3x per day before food  OR Add ozempic 1x per week low dose 0.25 (non insulin option)   OR Add low dose insulin daily    What does her daughter want to do?

## 2019-04-16 ENCOUNTER — Other Ambulatory Visit: Payer: Self-pay | Admitting: Cardiovascular Disease

## 2019-04-16 NOTE — Telephone Encounter (Signed)
Called patient's daughter and informed her of the medication reduction.  States that the patient already took a full dose of the Glipizide and Januvia today and she will start on the decreased dose tomorrow.   She states that the patient is currently on Ozempic  Sig: Inject 0.25 mg into the skin once a week.  Route: Subcutaneous

## 2019-04-16 NOTE — Telephone Encounter (Signed)
Called and informed patient's daughter of this. She verbalized understanding and will continue to check patient's sugars.

## 2019-04-16 NOTE — Telephone Encounter (Signed)
Please advise if ok to refill Potassium 20 meq qd . Last filled historical provider.

## 2019-04-16 NOTE — Progress Notes (Signed)
Patient ID: [name], female    DOB: [birth year], 86 y.o.   MRN: [id number]  HPI  [name] is a 86 y/o female with a history of Dm, hyperlipidemia, HTN, stroke, thyroid disease, GERD, anemia, SVT, severe aortic stenosis and chronic heart failure.   Echo report from 03/09/2018 reviewed and showed an EF of 50-55% along with mildly elevated PA pressure of 35m Hg.   Catheterization done 10/26/16 reviewed and showed: Patent coronary arteries with the exception of severe stenosis in the right PDA. There is mild nonobstructive stenosis of the LAD and RCA and a widely patent left main and left circumflex 2. Moderate to severe pulmonary hypertension with pulmonary artery pressure of 74/21 with a mean of 45 and a pulmonary capillary wedge pressure of 23 with a 32 mmHg T-wave 3. Mean transaortic valve gradient of 17 mmHg and calculated aortic valve area of 1.0 cm, suspect contamination of the pressure gradient with use of an AL-1 catheter and difficult measurement because of frequent ectopy  Has not been admitted or been in the ED in the last 6 months.   She presents today for a follow-up visit with a chief complaint of moderate fatigue upon minimal exertion. She describes this as chronic in nature having been present for several years. She has associated shortness of breath, left knee pain and continued swelling in her lower legs.   Received 812mIV lasix/ 4072mPO potassium last week and patient does feel like her breathing has improved. Her daughter that is with her says that patient is resting better since last week as well.   Past Medical History:  Diagnosis Date  . Anemia   . Carotid arterial disease (HCC[place]  a. 08/2014 U/S: Bilateral < 50% stenosis. Patent vertebrals w/ antegrade flow.   . Cervical spine arthritis   . Chronic diastolic CHF (congestive heart failure) ([facility]CC[place]  a. echo 2014: EF 55-60%, DD;  b. 08/2014 Echo: EF 55-60%, no RWMA, GR1DD; c. 02/2016 Echo: EF 65-70%, Gr1  DD.  . CMarland Kitchengnitive impairment   . Depression   . Diabetes mellitus without complication ([place] . Essential hypertension   . GERD (gastroesophageal reflux disease)   . Glaucoma   . Gout   . History of renal impairment   . Hyperlipidemia   . Hypotension    a. Related to Norvasc - 11/2014 ED visit.  . Multinodular goiter    a. Noted incidentally on CT 07/2012 and carotid U/S 08/2014;  b. Nl TSH 11/2014.  . PMarland Kitchenroxysmal SVT (supraventricular tachycardia) (HCC)    a. on Toprol   . Prolapsed uterus   . Severe aortic stenosis    a. 11/15/16: s/p TAVR Edwards Sapien 3 THV (size 23 mm, model # 9600TFX, serial # [id number]. [facility]  a. CT head 07/2014 showed small thalamic infarct  . Vitamin deficiency    Past Surgical History:  Procedure Laterality Date  . EYE SURGERY     unsure of what procedure, did know that laser was used  . IR RADIOLOGY PERIPHERAL GUIDED IV START  11/03/2016  . IR US [place] VASC ACCESS RIGHT  11/03/2016  . RIGHT/LEFT HEART CATH AND CORONARY ANGIOGRAPHY N/A 10/26/2016   Procedure: RIGHT/LEFT HEART CATH AND CORONARY ANGIOGRAPHY;  Surgeon: CooSherren MochaD;  [facility];  Service: Cardiovascular;  Laterality: N/A;  . TEE WITHOUT CARDIOVERSION N/A 03/31/2016   Procedure: TRANSESOPHAGEAL ECHOCARDIOGRAM (TEE);  Surgeon: TimMinna Merritts  MD;  Location: [facility];  Service: Cardiovascular;  Laterality: N/A;  . TEE WITHOUT CARDIOVERSION N/A 11/15/2016   Procedure: TRANSESOPHAGEAL ECHOCARDIOGRAM (TEE);  Surgeon: [name], MD;  Location: [place];  Service: Open Heart Surgery;  Laterality: N/A;  . TRANSCATHETER AORTIC VALVE REPLACEMENT, TRANSFEMORAL N/A 11/15/2016   Procedure: TRANSCATHETER AORTIC VALVE REPLACEMENT, TRANSFEMORAL;  Surgeon: [name], MD;  Location: [place];  Service: Open Heart Surgery;  Laterality: N/A;  . TUBAL LIGATION     Family History  Problem Relation Age of Onset  . Glaucoma Mother   . Diabetes Mother   . Peptic Ulcer Father   .  Colitis Father    Social History   Tobacco Use  . Smoking status: Never Smoker  . Smokeless tobacco: Never Used  Substance Use Topics  . Alcohol use: No   Allergies  Allergen Reactions  . Metformin Hcl Other (See Comments)    GI upset  . Metformin Other (See Comments), Diarrhea and Nausea And Vomiting  . Metformin And Related Diarrhea   Prior to Admission medications   Medication Sig Start Date End Date Taking? Authorizing Provider  ACCU-CHEK AVIVA PLUS test strip USE 1 STRIP TO TEST BLOOD SUGARS 3 TIMES A DAY 04/15/18  Yes McLean-Scocuzza, Nino Glow, MD  acetaminophen (TYLENOL) 500 MG tablet Take 1 tablet (500 mg total) by mouth every 6 (six) hours as needed for mild pain or headache. 04/15/18  Yes McLean-Scocuzza, Nino Glow, MD  allopurinol (ZYLOPRIM) 100 MG tablet Take 1 tablet (100 mg total) by mouth daily. 04/15/18  Yes McLean-Scocuzza, Nino Glow, MD  aspirin 81 MG chewable tablet Chew 1 tablet (81 mg total) by mouth daily. 04/15/18  Yes McLean-Scocuzza, Nino Glow, MD  blood glucose meter kit and supplies Dispense based on patient and insurance preference. Use 1x daily as directed. (FOR ICD-10 E10.9, E11.9). 11/30/17  Yes McLean-Scocuzza, Nino Glow, MD  brimonidine (ALPHAGAN P) 0.1 % SOLN Place 1 drop into the right eye 2 (two) times daily.   Yes [provider]  Carboxymethylcellulose Sodium (ARTIFICIAL TEARS OP) Apply 1 drop to eye 2 (two) times daily. Left eye   Yes [provider]  cholecalciferol (VITAMIN D) 25 MCG (1000 UT) tablet Take 1 tablet (1,000 Units total) by mouth daily. 04/15/18  Yes McLean-Scocuzza, Nino Glow, MD  clotrimazole (LOTRIMIN) 1 % cream APPLY TOPICALLY TO THE AFFECTED AREA ONCE A DAY AT BEDTIME FOR 7-14 DAYS 04/15/18  Yes McLean-Scocuzza, Nino Glow, MD  diclofenac Sodium (VOLTAREN) 1 % GEL diclofenac 1 % topical gel  APPLY TO AFFECTED AREA(S) TOPICALLY EVERY 6 HOURS   Yes [provider]  dimethicone (PROSHIELD PLUS SKIN PROTECTANT) 1 % cream 1  application to the buttock 2 times daily to help protect skin 04/15/18  Yes McLean-Scocuzza, Nino Glow, MD  Dorzolamide HCl-Timolol Mal (COSOPT OP) Place 1 drop into the right eye 2 (two) times daily.    Yes [provider]  feeding supplement, ENSURE ENLIVE, (ENSURE ENLIVE) LIQD Take 237 mLs by mouth 2 (two) times daily between meals. 04/01/16  Yes Fritzi Mandes, MD  fluticasone (FLONASE) 50 MCG/ACT nasal spray Place 1 spray into both nostrils daily. Prn max 2 sprays total each nose 04/15/18  Yes McLean-Scocuzza, Nino Glow, MD  gabapentin (NEURONTIN) 300 MG capsule Take 1 capsule (300 mg total) by mouth 2 (two) times daily. 04/15/18  Yes McLean-Scocuzza, Nino Glow, MD  glucose blood test strip Accu-Chek Aviva Plus test strips 04/15/18  Yes McLean-Scocuzza, Nino Glow, MD  glucose  blood test strip Bid Use as instructed E11.9 12/28/18  Yes McLean-Scocuzza, Nino Glow, MD  Insulin Pen Needle (PEN NEEDLES) 30G X 8 MM MISC 1 Device by Does not apply route once a week. 03/13/19  Yes McLean-Scocuzza, Nino Glow, MD  Lancets [facility] ULTRASOFT) lancets Bid E 11.9 Use as instructed 12/28/18  Yes McLean-Scocuzza, Nino Glow, MD  latanoprost (XALATAN) 0.005 % ophthalmic solution Place 1 drop into the right eye at bedtime.    Yes [provider]  LORazepam (ATIVAN) 0.5 MG tablet Take 0.5 mg by mouth every 8 (eight) hours as needed for anxiety.   Yes [provider]  lovastatin (MEVACOR) 40 MG tablet Take 1 tablet (40 mg total) by mouth at bedtime. 04/15/18  Yes McLean-Scocuzza, Nino Glow, MD  metoprolol succinate (TOPROL-XL) 25 MG 24 hr tablet Take 1 tablet (25 mg total) by mouth daily. 04/15/18  Yes McLean-Scocuzza, Nino Glow, MD  Multiple Vitamin (MULTI-VITAMIN PO) Take 1 tablet by mouth daily.   Yes [provider]  mupirocin ointment (BACTROBAN) 2 % Apply 1 application topically 2 (two) times daily. To open sores as needed 07/26/18  Yes McLean-Scocuzza, Nino Glow, MD  mupirocin ointment (BACTROBAN) 2 %  Apply topically 2 (two) times daily. 11/16/18  Yes McLean-Scocuzza, Nino Glow, MD  [facility] DELICA LANCETS 00F MISC USE TO TEST BLOOD SUGAR ONCE A DAY 11/30/17  Yes [provider]  pantoprazole (PROTONIX) 40 MG tablet Take 1 tablet (40 mg total) by mouth daily. 30 minutes before breakfast OR dinner 04/15/18  Yes McLean-Scocuzza, Nino Glow, MD  pilocarpine (PILOCAR) 4 % ophthalmic solution Place 1 drop into the right eye 4 (four) times daily.    Yes [provider]  polyethylene glycol (MIRALAX) packet Take 17 g by mouth daily. prn 04/15/18  Yes McLean-Scocuzza, Nino Glow, MD  Polysacch Fe Complex-Vit D3 (NOVAFERRUM 125) 125-100 MG-UNT/5ML LIQD Take 125 mg by mouth daily. 12/06/18  Yes McLean-Scocuzza, Nino Glow, MD  Potassium Chloride ER 20 MEQ TBCR TAKE 1 TABLET BY MOUTH EVERY DAY WITH FLUID PILL 04/16/19  Yes Gollan, Kathlene November, MD  sertraline (ZOLOFT) 50 MG tablet Take 1 tablet (50 mg total) by mouth daily. 04/15/18  Yes McLean-Scocuzza, Nino Glow, MD  Sodium Chloride Flush (NORMAL SALINE FLUSH) 0.9 % SOLN sodium chloride 0.9 % intravenous solution   Yes [provider]  torsemide (DEMADEX) 20 MG tablet Take 94m every other day, alternate with 40 mg every other day 03/14/19  Yes WLoel Dubonnet NP  traZODone (DESYREL) 100 MG tablet Take 0.5-1 tablets (50-100 mg total) by mouth at bedtime as needed. 04/15/18  Yes McLean-Scocuzza, TNino Glow MD  triamcinolone cream (KENALOG) 0.1 % Apply 1 application topically 2 (two) times daily. Prn legs 08/16/18  Yes McLean-Scocuzza, TNino Glow MD  vitamin C (ASCORBIC ACID) 500 MG tablet Take 1 tablet (500 mg total) by mouth daily. 04/15/18  Yes McLean-Scocuzza, TNino Glow MD  Zinc Oxide 10 % OINT Apply 1 application topically daily as needed (rash). Applied to patient's bottom 04/15/18  Yes McLean-Scocuzza, TNino Glow MD  glipiZIDE (GLUCOTROL) 5 MG tablet Take 0.5 tablets (2.5 mg total) by mouth daily before breakfast. 04/17/19   McLean-Scocuzza, TNino Glow MD   Semaglutide,0.25 or 0.5MG/DOS, (OZEMPIC, 0.25 OR 0.5 MG/DOSE,) 2 MG/1.5ML SOPN Inject 0.5 mg into the skin once a week. 04/17/19   McLean-Scocuzza, TNino Glow MD  sitaGLIPtin (JANUVIA) 50 MG tablet Take 0.5 tablets (25 mg total) by mouth daily. 04/17/19   McLean-Scocuzza, TNino Glow MD  Review of Systems  Constitutional: Positive for fatigue (easily). Negative for appetite change.  HENT: Negative for congestion, postnasal drip and sore throat.   Eyes: Negative.   Respiratory: Positive for shortness of breath (improving). Negative for chest tightness.   Cardiovascular: Positive for leg swelling. Negative for chest pain and palpitations.  Gastrointestinal: Negative for abdominal distention and abdominal pain.  Endocrine: Negative.   Genitourinary: Negative.   Musculoskeletal: Positive for arthralgias (left knee). Negative for back pain and neck pain.  Skin: Negative.   Allergic/Immunologic: Negative.   Neurological: Negative for dizziness and light-headedness.  Hematological: Negative for adenopathy. Does not bruise/bleed easily.  Psychiatric/Behavioral: Negative for dysphoric mood and sleep disturbance (sleeps in recliner). The patient is not nervous/anxious.    Vitals:   04/17/19 1159  BP: (!) 175/82  Pulse: 73  Resp: 16  SpO2: 95%  Weight: 187 lb 6.4 oz (85 kg)  Height: 5' (1.524 m)   Wt Readings from Last 3 Encounters:  04/17/19 187 lb 6.4 oz (85 kg)  04/11/19 185 lb (83.9 kg)  03/13/19 190 lb (86.2 kg)   Lab Results  Component Value Date   CREATININE 1.92 (H) 04/11/2019   CREATININE 1.51 (H) 03/13/2019   CREATININE 1.20 10/23/2018     Physical Exam Vitals and nursing note reviewed.  Constitutional:      Appearance: Normal appearance.  HENT:     Head: Normocephalic and atraumatic.  Cardiovascular:     Rate and Rhythm: Normal rate and regular rhythm.  Pulmonary:     Effort: Pulmonary effort is normal. No respiratory distress.     Breath sounds: No wheezing,  rhonchi or rales.  Abdominal:     General: There is no distension.     Palpations: Abdomen is soft.  Musculoskeletal:        General: No tenderness.     Cervical back: Normal range of motion and neck supple.     Right lower leg: Edema (1+ pitting) present.     Left lower leg: Edema (1+ pitting) present.  Skin:    General: Skin is warm and dry.  Neurological:     General: No focal deficit present.     Mental Status: She is alert and oriented to person, place, and time.  Psychiatric:        Mood and Affect: Mood normal.        Behavior: Behavior normal.    Assessment & Plan:  1: Acute on Chronic heart failure with preserved ejection fraction- - NYHA class III - continue to be fluid overloaded today although does look better than last week - weighing daily; reminded to call for an overnight weight gain of >2 pounds or a weekly weight gain of >5 pounds - weight up 2 pounds since last visit 1 week ago - not adding salt to her food except a "sprinkle" when eating seafood.  - limited in diuretic use due to renal function - received 25m IV lasix/ 469mPO potassium last week; will send patient back today for 8043mV lasix/ 28m54m potassium  - BMP/BNP to be checked today as well - saw cardiology (WalGilford Rile13/21 - BNP 04/11/19 was 678.0 - has received flu vaccine this season. - consider palliative care consult  2: HTN- - BP elevated initially (175/82) - saw PCP (McLean-Scocuzza) 03/13/19 - BMP from 04/11/19 reviewed and showed sodium 138, potassium 4.9, creatinine 1.92 and GFR 26  3: DM- - A1c 03/13/19 was 8.3% - patient checks blood sugar daily, unable to  remember the number this morning.   4: Lymphedema- - stage 2 - saw vascular 08/23/2018 - elevating her legs "at times"; encouraged her to elevate her legs when sitting for long periods of time - has been wearing support socks daily & currently has both lower legs wrapped - limited in her ability to exercise due to her shortness  of breath - had been wearing compression boots but hasn't worn them much since at her other daughter's house.   Patient did not bring medications nor a list. Each medication was verbally reviewed with the patient and was encouraged to bring the bottles to every visit to confirm accuracy of the list.   Return next week or sooner for any questions/problems before then.

## 2019-04-16 NOTE — Telephone Encounter (Signed)
If sugar still elevated can increase ozempic to 0.5 mg 1x per week  Inform daughter

## 2019-04-17 ENCOUNTER — Other Ambulatory Visit: Payer: Self-pay | Admitting: Internal Medicine

## 2019-04-17 ENCOUNTER — Other Ambulatory Visit (INDEPENDENT_AMBULATORY_CARE_PROVIDER_SITE_OTHER): Payer: PPO

## 2019-04-17 ENCOUNTER — Encounter: Payer: Self-pay | Admitting: Family

## 2019-04-17 ENCOUNTER — Other Ambulatory Visit: Payer: Self-pay | Admitting: Family

## 2019-04-17 ENCOUNTER — Ambulatory Visit: Payer: PPO | Admitting: Family

## 2019-04-17 ENCOUNTER — Ambulatory Visit
Admission: RE | Admit: 2019-04-17 | Discharge: 2019-04-17 | Disposition: A | Discharge status: Home / Self Care | Payer: PPO | Source: Ambulatory Visit | Attending: Family | Admitting: Family

## 2019-04-17 ENCOUNTER — Other Ambulatory Visit: Payer: Self-pay

## 2019-04-17 VITALS — BP 175/82 | HR 73 | Resp 16 | Ht 60.0 in | Wt 187.4 lb

## 2019-04-17 DIAGNOSIS — E1122 Type 2 diabetes mellitus with diabetic chronic kidney disease: Secondary | ICD-10-CM

## 2019-04-17 DIAGNOSIS — I110 Hypertensive heart disease with heart failure: Secondary | ICD-10-CM | POA: Insufficient documentation

## 2019-04-17 DIAGNOSIS — I130 Hypertensive heart and chronic kidney disease with heart failure and stage 1 through stage 4 chronic kidney disease, or unspecified chronic kidney disease: Secondary | ICD-10-CM | POA: Insufficient documentation

## 2019-04-17 DIAGNOSIS — N179 Acute kidney failure, unspecified: Secondary | ICD-10-CM | POA: Diagnosis not present

## 2019-04-17 DIAGNOSIS — R5383 Other fatigue: Secondary | ICD-10-CM

## 2019-04-17 DIAGNOSIS — Z8673 Personal history of transient ischemic attack (TIA), and cerebral infarction without residual deficits: Secondary | ICD-10-CM | POA: Insufficient documentation

## 2019-04-17 DIAGNOSIS — I5033 Acute on chronic diastolic (congestive) heart failure: Secondary | ICD-10-CM | POA: Diagnosis not present

## 2019-04-17 DIAGNOSIS — I10 Essential (primary) hypertension: Secondary | ICD-10-CM

## 2019-04-17 DIAGNOSIS — I350 Nonrheumatic aortic (valve) stenosis: Secondary | ICD-10-CM | POA: Insufficient documentation

## 2019-04-17 DIAGNOSIS — Z1329 Encounter for screening for other suspected endocrine disorder: Secondary | ICD-10-CM | POA: Diagnosis not present

## 2019-04-17 DIAGNOSIS — E785 Hyperlipidemia, unspecified: Secondary | ICD-10-CM | POA: Insufficient documentation

## 2019-04-17 DIAGNOSIS — R0602 Shortness of breath: Secondary | ICD-10-CM | POA: Insufficient documentation

## 2019-04-17 DIAGNOSIS — I272 Pulmonary hypertension, unspecified: Secondary | ICD-10-CM | POA: Insufficient documentation

## 2019-04-17 DIAGNOSIS — N184 Chronic kidney disease, stage 4 (severe): Secondary | ICD-10-CM | POA: Insufficient documentation

## 2019-04-17 DIAGNOSIS — D649 Anemia, unspecified: Secondary | ICD-10-CM

## 2019-04-17 DIAGNOSIS — E079 Disorder of thyroid, unspecified: Secondary | ICD-10-CM | POA: Insufficient documentation

## 2019-04-17 DIAGNOSIS — I890 Lymphedema, not elsewhere classified: Secondary | ICD-10-CM | POA: Insufficient documentation

## 2019-04-17 DIAGNOSIS — I5032 Chronic diastolic (congestive) heart failure: Secondary | ICD-10-CM | POA: Insufficient documentation

## 2019-04-17 DIAGNOSIS — Z794 Long term (current) use of insulin: Secondary | ICD-10-CM | POA: Insufficient documentation

## 2019-04-17 DIAGNOSIS — F329 Major depressive disorder, single episode, unspecified: Secondary | ICD-10-CM | POA: Insufficient documentation

## 2019-04-17 DIAGNOSIS — M109 Gout, unspecified: Secondary | ICD-10-CM | POA: Insufficient documentation

## 2019-04-17 DIAGNOSIS — Z79899 Other long term (current) drug therapy: Secondary | ICD-10-CM | POA: Insufficient documentation

## 2019-04-17 DIAGNOSIS — Z7982 Long term (current) use of aspirin: Secondary | ICD-10-CM | POA: Insufficient documentation

## 2019-04-17 DIAGNOSIS — M25562 Pain in left knee: Secondary | ICD-10-CM | POA: Insufficient documentation

## 2019-04-17 DIAGNOSIS — E119 Type 2 diabetes mellitus without complications: Secondary | ICD-10-CM | POA: Insufficient documentation

## 2019-04-17 DIAGNOSIS — N761 Subacute and chronic vaginitis: Secondary | ICD-10-CM

## 2019-04-17 DIAGNOSIS — K219 Gastro-esophageal reflux disease without esophagitis: Secondary | ICD-10-CM | POA: Insufficient documentation

## 2019-04-17 DIAGNOSIS — E1165 Type 2 diabetes mellitus with hyperglycemia: Secondary | ICD-10-CM

## 2019-04-17 LAB — COMPREHENSIVE METABOLIC PANEL
ALT: 19 U/L (ref 0–35)
AST: 27 U/L (ref 0–37)
Albumin: 3.8 g/dL (ref 3.5–5.2)
Alkaline Phosphatase: 51 U/L (ref 39–117)
BUN: 25 mg/dL — ABNORMAL HIGH (ref 6–23)
CO2: 35 mEq/L — ABNORMAL HIGH (ref 19–32)
Calcium: 9.8 mg/dL (ref 8.4–10.5)
Chloride: 97 mEq/L (ref 96–112)
Creatinine, Ser: 1.56 mg/dL — ABNORMAL HIGH (ref 0.40–1.20)
GFR: 37.52 mL/min — ABNORMAL LOW (ref >60.00)
Glucose, Bld: 116 mg/dL — ABNORMAL HIGH (ref 70–99)
Potassium: 4.2 mEq/L (ref 3.5–5.1)
Sodium: 139 mEq/L (ref 135–145)
Total Bilirubin: 0.6 mg/dL (ref 0.2–1.2)
Total Protein: 7.7 g/dL (ref 6.0–8.3)

## 2019-04-17 LAB — LIPID PANEL
Cholesterol: 166 mg/dL (ref 0–200)
HDL: 38.4 mg/dL — ABNORMAL LOW (ref >39.00)
NonHDL: 127.91
Total CHOL/HDL Ratio: 4
Triglycerides: 227 mg/dL — ABNORMAL HIGH (ref 0.0–149.0)
VLDL: 45.4 mg/dL — ABNORMAL HIGH (ref 0.0–40.0)

## 2019-04-17 LAB — BASIC METABOLIC PANEL
Anion gap: 13 (ref 5–15)
BUN: 26 mg/dL — ABNORMAL HIGH (ref 8–23)
CO2: 28 mmol/L (ref 22–32)
Calcium: 9.4 mg/dL (ref 8.9–10.3)
Chloride: 98 mmol/L (ref 98–111)
Creatinine, Ser: 1.58 mg/dL — ABNORMAL HIGH (ref 0.44–1.00)
GFR calc Af Amer: 33 mL/min — ABNORMAL LOW (ref >60)
GFR calc non Af Amer: 28 mL/min — ABNORMAL LOW (ref >60)
Glucose, Bld: 133 mg/dL — ABNORMAL HIGH (ref 70–99)
Potassium: 4.1 mmol/L (ref 3.5–5.1)
Sodium: 139 mmol/L (ref 135–145)

## 2019-04-17 LAB — LDL CHOLESTEROL, DIRECT: Direct LDL: 73 mg/dL

## 2019-04-17 LAB — BRAIN NATRIURETIC PEPTIDE: B Natriuretic Peptide: 77 pg/mL (ref 0.0–100.0)

## 2019-04-17 LAB — TSH: TSH: 1.52 u[IU]/mL (ref 0.35–4.50)

## 2019-04-17 MED ORDER — FUROSEMIDE 10 MG/ML IJ SOLN
INTRAMUSCULAR | Status: AC
  Filled 2019-04-17: qty 8

## 2019-04-17 MED ORDER — POTASSIUM CHLORIDE CRYS ER 20 MEQ PO TBCR
40.0000 meq | EXTENDED_RELEASE_TABLET | Freq: Once | ORAL | Status: AC
  Administered 2019-04-17: 14:00:00 40 meq via ORAL

## 2019-04-17 MED ORDER — SITAGLIPTIN PHOSPHATE 50 MG PO TABS: 25.0000 mg | ORAL_TABLET | Freq: Every day | ORAL | Status: DC

## 2019-04-17 MED ORDER — OZEMPIC (0.25 OR 0.5 MG/DOSE) 2 MG/1.5ML ~~LOC~~ SOPN: 0.5000 mg | PEN_INJECTOR | SUBCUTANEOUS | 11 refills | Status: DC

## 2019-04-17 MED ORDER — GLIPIZIDE 5 MG PO TABS: 2.5000 mg | ORAL_TABLET | Freq: Every day | ORAL | Status: DC

## 2019-04-17 MED ORDER — FUROSEMIDE 10 MG/ML IJ SOLN
80.0000 mg | Freq: Once | INTRAMUSCULAR | Status: AC
  Administered 2019-04-17: 14:00:00 80 mg via INTRAVENOUS

## 2019-04-17 MED ORDER — POTASSIUM CHLORIDE CRYS ER 20 MEQ PO TBCR
EXTENDED_RELEASE_TABLET | ORAL | Status: AC
  Filled 2019-04-17: qty 2

## 2019-04-17 NOTE — OR Nursing (Signed)
Upon discharge pt. Asked to got to restroom with daughters help. RN Margorie John offered to assists pt. And daughter refused. Pt. Departed in daughters care in NAD.

## 2019-04-17 NOTE — Addendum Note (Signed)
Addended by: Leeanne Rio on: 04/17/2019 05:09 PM   Modules accepted: Orders

## 2019-04-17 NOTE — Patient Instructions (Signed)
Continue weighing daily and call for an overnight weight gain of > 2 pounds or a weekly weight gain of >5 pounds. 

## 2019-04-18 LAB — IRON,TIBC AND FERRITIN PANEL
%SAT: 32 % (calc) (ref 16–45)
Ferritin: 392 ng/mL — ABNORMAL HIGH (ref 16–288)
Iron: 74 ug/dL (ref 45–160)
TIBC: 228 mcg/dL (calc) — ABNORMAL LOW (ref 250–450)

## 2019-04-18 NOTE — Telephone Encounter (Signed)
Spoke with patient's daughter and she is agreeable to a sliding scale insulin.   [name] states she will start cutting patient's pills in half ASAP. She will also increase the patient's Ozempic dose when it is time for her next shot.

## 2019-04-18 NOTE — Telephone Encounter (Signed)
Called and spoke with patient's daughter, [name], about recent lab results.   During this [name] states that the patient's sugars have been high. Her sugars were 217 fasting and 400 after eating later that day.   The patient is still taking the full dose of the Januvia and Glipizide along with her weekl Ozempic. She has not started the half pills.   [name] would like to know if you would like to increase the patient's pill dose along with the Ozempic increase. Please advise

## 2019-04-18 NOTE — Telephone Encounter (Signed)
Ozempic 0.5  Glipizide 2.5  Januvia 25   Due to kidney function this is why we have to reduce doses of pills  Diet is an issue make sure having healthy diet choices as well   Other option would be to give her sliding scale insulin to take when sugars elevated, does she want to do this?

## 2019-04-19 NOTE — Progress Notes (Signed)
Patient ID: [name], female    DOB: [birth year], 86 y.o.   MRN: [id number]  HPI  Ms [name] is a 86 y/o female with a history of Dm, hyperlipidemia, HTN, stroke, thyroid disease, GERD, anemia, SVT, severe aortic stenosis and chronic heart failure.   Echo report from 03/09/2018 reviewed and showed an EF of 50-55% along with mildly elevated PA pressure of 70m Hg.   Catheterization done 10/26/16 reviewed and showed: Patent coronary arteries with the exception of severe stenosis in the right PDA. There is mild nonobstructive stenosis of the LAD and RCA and a widely patent left main and left circumflex 2. Moderate to severe pulmonary hypertension with pulmonary artery pressure of 74/21 with a mean of 45 and a pulmonary capillary wedge pressure of 23 with a 32 mmHg T-wave 3. Mean transaortic valve gradient of 17 mmHg and calculated aortic valve area of 1.0 cm, suspect contamination of the pressure gradient with use of an AL-1 catheter and difficult measurement because of frequent ectopy  Has not been admitted or been in the ED in the last 6 months.   She presents today for a follow-up visit with a chief complaint of moderate shortness of breath upon minimal exertion. She describes this as chronic in nature having been present for several years although has been improving over these last couple of weeks that she's been getting IV lasix. Daughter that is present with her says that she's noticed that patient seems to be feeling better. She has associated fatigue, pedal edema, difficulty sleeping and knee pain along with this. She denies any dizziness, abdominal distention, palpitations, chest pain, cough or weight gain.   Received 853mIV lasix/ 4063mPO potassium twice in the last 2 weeks.   Past Medical History:  Diagnosis Date  . Anemia   . Car[place]id arterial disease ([place]  a. 08/2014 U/S: Bilateral < 50% stenosis. Patent vertebrals w/ antegrade flow.   . Cervical spine arthritis    . Chronic diastolic CHF (congestive heart failure) ([place]  a. echo 2014: EF 55-60%, DD;  b. 08/2014 Echo: EF 55-60%, no RWMA, GR1DD; c. 02/2016 Echo: EF 65-70%, Gr1 DD.  . CMarland Kitchengnitive impairment   . Depression   . Diabetes mellitus without complication ([facility] [facility] [place] . Essential hypertension   . GERD (gastroesophageal reflux disease)   . Glaucoma   . Gout   . History of renal impairment   . Hyperlipidemia   . Hypotension    a. Related to Norvasc - 11/2014 ED visit.  . Multinodular goiter    a. Noted incidentally on CT 07/2012 and carotid U/S 08/2014;  b. Nl TSH 11/2014.  . PMarland Kitchenroxysmal SVT (supraventricular tachycardia) (HCC)    a. on Toprol   . Prolapsed uterus   . Severe aortic stenosis    a. 11/15/16: s/p TAVR Edwards Sapien 3 THV (size 23 mm, model # 9600TFX, serial # [id number]. [facility]  a[facility] CT head 07/2014 showed small thalamic infarct  . Vitamin deficiency    Past Surgical History:  Procedure Laterality Date  . EYE SURGERY     unsure of what procedure, did know that laser was used  . IR RADIOLOGY PERIPHERAL GUIDED IV START  11/03/2016  . IR US [place] VASC ACCESS RIGHT  11/03/2016  . RIGHT/LEFT HEART CATH AND CORONARY ANGIOGRAPHY N/A 10/26/2016   Procedure: RIGHT/LEFT HEART CATH AND CORONARY ANGIOGRAPHY;  Surgeon: CooSherren MochaD;  [facility];  Service: Cardiovascular;  Laterality: N/A;  . TEE WITHOUT CARDIOVERSION N/A 03/31/2016   Procedure: TRANSESOPHAGEAL ECHOCARDIOGRAM (TEE);  Surgeon: Minna Merritts, MD;  Location: [facility];  Service: Cardiovascular;  Laterality: N/A;  . TEE WITHOUT CARDIOVERSION N/A 11/15/2016   Procedure: TRANSESOPHAGEAL ECHOCARDIOGRAM (TEE);  Surgeon: [name], MD;  Location: [place];  Service: Open Heart Surgery;  Laterality: N/A;  . TRANSCATHETER AORTIC VALVE REPLACEMENT, TRANSFEMORAL N/A 11/15/2016   Procedure: TRANSCATHETER AORTIC VALVE REPLACEMENT, TRANSFEMORAL;  Surgeon: [name], MD;  Location: [place];  Service: Open Heart  Surgery;  Laterality: N/A;  . TUBAL LIGATION     Family History  Problem Relation Age of Onset  . Glaucoma Mother   . Diabetes Mother   . Peptic Ulcer Father   . Colitis Father    Social History   Tobacco Use  . Smoking status: Never Smoker  . Smokeless tobacco: Never Used  Substance Use Topics  . Alcohol use: No   Allergies  Allergen Reactions  . Metformin Hcl Other (See Comments)    GI upset  . Metformin Other (See Comments), Diarrhea and Nausea And Vomiting  . Metformin And Related Diarrhea   Prior to Admission medications   Medication Sig Start Date End Date Taking? Authorizing Provider  ACCU-CHEK AVIVA PLUS test strip USE 1 STRIP TO TEST BLOOD SUGARS 3 TIMES A DAY 04/15/18  Yes McLean-Scocuzza, Nino Glow, MD  acetaminophen (TYLENOL) 500 MG tablet Take 1 tablet (500 mg total) by mouth every 6 (six) hours as needed for mild pain or headache. 04/15/18  Yes McLean-Scocuzza, Nino Glow, MD  allopurinol (ZYLOPRIM) 100 MG tablet Take 1 tablet (100 mg total) by mouth daily. 04/15/18  Yes McLean-Scocuzza, Nino Glow, MD  aspirin 81 MG chewable tablet Chew 1 tablet (81 mg total) by mouth daily. 04/15/18  Yes McLean-Scocuzza, Nino Glow, MD  blood glucose meter kit and supplies Dispense based on patient and insurance preference. Use 1x daily as directed. (FOR ICD-10 E10.9, E11.9). 11/30/17  Yes McLean-Scocuzza, Nino Glow, MD  brimonidine (ALPHAGAN P) 0.1 % SOLN Place 1 drop into the right eye 2 (two) times daily.   Yes [provider]  Carboxymethylcellulose Sodium (ARTIFICIAL TEARS OP) Apply 1 drop to eye 2 (two) times daily. Left eye   Yes [provider]  cholecalciferol (VITAMIN D) 25 MCG (1000 UT) tablet Take 1 tablet (1,000 Units total) by mouth daily. 04/15/18  Yes McLean-Scocuzza, Nino Glow, MD  clotrimazole (LOTRIMIN) 1 % cream APPLY TOPICALLY TO THE AFFECTED AREA ONCE A DAY AT BEDTIME FOR 7-14 DAYS 04/15/18  Yes McLean-Scocuzza, Nino Glow, MD  diclofenac Sodium (VOLTAREN) 1 % GEL  diclofenac 1 % topical gel  APPLY TO AFFECTED AREA(S) TOPICALLY EVERY 6 HOURS   Yes [provider]  dimethicone (PROSHIELD PLUS SKIN PROTECTANT) 1 % cream 1 application to the buttock 2 times daily to help protect skin 04/15/18  Yes McLean-Scocuzza, Nino Glow, MD  Dorzolamide HCl-Timolol Mal (COSOPT OP) Place 1 drop into the right eye 2 (two) times daily.    Yes [provider]  feeding supplement, ENSURE ENLIVE, (ENSURE ENLIVE) LIQD Take 237 mLs by mouth 2 (two) times daily between meals. 04/01/16  Yes Fritzi Mandes, MD  fluticasone (FLONASE) 50 MCG/ACT nasal spray Place 1 spray into both nostrils daily. Prn max 2 sprays total each nose 04/15/18  Yes McLean-Scocuzza, Nino Glow, MD  gabapentin (NEURONTIN) 300 MG capsule Take 1 capsule (300 mg total) by mouth 2 (two) times daily. 04/15/18  Yes  McLean-Scocuzza, Nino Glow, MD  glipiZIDE (GLUCOTROL) 5 MG tablet Take 0.5 tablets (2.5 mg total) by mouth daily before breakfast. 04/17/19  Yes McLean-Scocuzza, Nino Glow, MD  glucose blood test strip Accu-Chek Aviva Plus test strips 04/15/18  Yes McLean-Scocuzza, Nino Glow, MD  glucose blood test strip Bid Use as instructed E11.9 12/28/18  Yes McLean-Scocuzza, Nino Glow, MD  insulin lispro (HUMALOG KWIKPEN) 100 UNIT/ML KwikPen TID before meals 15-30 minutes based on sugar reading before meals  If sugar: 70-130 0 units  131-180 2 units  181-240 4 units  241-300 6 units  301-350 8 units  351-400 10 units >400 12 units and call doctor 04/21/19  Yes McLean-Scocuzza, Nino Glow, MD  Insulin Pen Needle (PEN NEEDLES) 30G X 8 MM MISC 1 Device by Does not apply route once a week. 03/13/19  Yes McLean-Scocuzza, Nino Glow, MD  Insulin Pen Needle (PEN NEEDLES) 30G X 8 MM MISC 1 Device by Does not apply route 3 (three) times daily before meals. 04/21/19  Yes McLean-Scocuzza, Nino Glow, MD  Lancets [facility] ULTRASOFT) lancets Bid E 11.9 Use as instructed 12/28/18  Yes McLean-Scocuzza, Nino Glow, MD  latanoprost (XALATAN) 0.005 %  ophthalmic solution Place 1 drop into the right eye at bedtime.    Yes [provider]  LORazepam (ATIVAN) 0.5 MG tablet Take 0.5 mg by mouth every 8 (eight) hours as needed for anxiety.   Yes [provider]  lovastatin (MEVACOR) 40 MG tablet Take 1 tablet (40 mg total) by mouth at bedtime. 04/15/18  Yes McLean-Scocuzza, Nino Glow, MD  metoprolol succinate (TOPROL-XL) 25 MG 24 hr tablet Take 1 tablet (25 mg total) by mouth daily. 04/15/18  Yes McLean-Scocuzza, Nino Glow, MD  Multiple Vitamin (MULTI-VITAMIN PO) Take 1 tablet by mouth daily.   Yes [provider]  mupirocin ointment (BACTROBAN) 2 % Apply 1 application topically 2 (two) times daily. To open sores as needed 07/26/18  Yes McLean-Scocuzza, Nino Glow, MD  mupirocin ointment (BACTROBAN) 2 % Apply topically 2 (two) times daily. 11/16/18  Yes McLean-Scocuzza, Nino Glow, MD  [facility] DELICA LANCETS 67P MISC USE TO TEST BLOOD SUGAR ONCE A DAY 11/30/17  Yes [provider]  pantoprazole (PROTONIX) 40 MG tablet Take 1 tablet (40 mg total) by mouth daily. 30 minutes before breakfast OR dinner 04/15/18  Yes McLean-Scocuzza, Nino Glow, MD  pilocarpine (PILOCAR) 4 % ophthalmic solution Place 1 drop into the right eye 4 (four) times daily.    Yes [provider]  polyethylene glycol (MIRALAX) packet Take 17 g by mouth daily. prn 04/15/18  Yes McLean-Scocuzza, Nino Glow, MD  Polysacch Fe Complex-Vit D3 (NOVAFERRUM 125) 125-100 MG-UNT/5ML LIQD Take 125 mg by mouth daily. 12/06/18  Yes McLean-Scocuzza, Nino Glow, MD  Potassium Chloride ER 20 MEQ TBCR TAKE 1 TABLET BY MOUTH EVERY DAY WITH FLUID PILL 04/16/19  Yes Gollan, Kathlene November, MD  Semaglutide,0.25 or 0.5MG/DOS, (OZEMPIC, 0.25 OR 0.5 MG/DOSE,) 2 MG/1.5ML SOPN Inject 0.5 mg into the skin once a week. 04/17/19  Yes McLean-Scocuzza, Nino Glow, MD  sertraline (ZOLOFT) 50 MG tablet Take 1 tablet (50 mg total) by mouth daily. 04/15/18  Yes McLean-Scocuzza, Nino Glow, MD  sitaGLIPtin (JANUVIA)  50 MG tablet Take 0.5 tablets (25 mg total) by mouth daily. 04/17/19  Yes McLean-Scocuzza, Nino Glow, MD  Sodium Chloride Flush (NORMAL SALINE FLUSH) 0.9 % SOLN sodium chloride 0.9 % intravenous solution   Yes [provider]  torsemide (DEMADEX) 20 MG tablet Take 30m every other  day, alternate with 40 mg every other day 03/14/19  Yes Loel Dubonnet, NP  traZODone (DESYREL) 100 MG tablet Take 0.5-1 tablets (50-100 mg total) by mouth at bedtime as needed. 04/15/18  Yes McLean-Scocuzza, Nino Glow, MD  triamcinolone cream (KENALOG) 0.1 % Apply 1 application topically 2 (two) times daily. Prn legs 08/16/18  Yes McLean-Scocuzza, Nino Glow, MD  vitamin C (ASCORBIC ACID) 500 MG tablet Take 1 tablet (500 mg total) by mouth daily. 04/15/18  Yes McLean-Scocuzza, Nino Glow, MD  Zinc Oxide 10 % OINT Apply 1 application topically daily as needed (rash). Applied to patient's bottom 04/15/18  Yes McLean-Scocuzza, Nino Glow, MD    Review of Systems  Constitutional: Positive for fatigue (easily). Negative for appetite change.  HENT: Negative for congestion, postnasal drip and sore throat.   Eyes: Negative.   Respiratory: Positive for shortness of breath (improving). Negative for cough and chest tightness.   Cardiovascular: Positive for leg swelling. Negative for chest pain and palpitations.  Gastrointestinal: Negative for abdominal distention and abdominal pain.  Endocrine: Negative.   Genitourinary: Negative.   Musculoskeletal: Positive for arthralgias (left knee). Negative for back pain and neck pain.  Skin: Negative.   Allergic/Immunologic: Negative.   Neurological: Negative for dizziness and light-headedness.  Hematological: Negative for adenopathy. Does not bruise/bleed easily.  Psychiatric/Behavioral: Positive for sleep disturbance (sleeps in recliner). Negative for dysphoric mood. The patient is not nervous/anxious.    Vitals:   04/22/19 1038  BP: (!) 161/83  Pulse: 71  Resp: 18  SpO2: 100%   Weight: 184 lb 9.6 oz (83.7 kg)  Height: 5' (1.524 m)   Wt Readings from Last 3 Encounters:  04/22/19 184 lb 9.6 oz (83.7 kg)  04/17/19 187 lb 6.4 oz (85 kg)  04/11/19 185 lb (83.9 kg)   Lab Results  Component Value Date   CREATININE 1.58 (H) 04/17/2019   CREATININE 1.56 (H) 04/17/2019   CREATININE 1.92 (H) 04/11/2019    Physical Exam Vitals and nursing note reviewed.  Constitutional:      Appearance: Normal appearance.  HENT:     Head: Normocephalic and atraumatic.  Cardiovascular:     Rate and Rhythm: Normal rate and regular rhythm.  Pulmonary:     Effort: Pulmonary effort is normal. No respiratory distress.     Breath sounds: No wheezing, rhonchi or rales.  Abdominal:     General: There is no distension.     Palpations: Abdomen is soft.  Musculoskeletal:        General: No tenderness.     Cervical back: Normal range of motion and neck supple.     Right lower leg: Edema (1+ pitting although softer) present.     Left lower leg: Edema (1+ pitting although softer) present.  Skin:    General: Skin is warm and dry.  Neurological:     General: No focal deficit present.     Mental Status: She is alert and oriented to person, place, and time.  Psychiatric:        Mood and Affect: Mood normal.        Behavior: Behavior normal.    Assessment & Plan:  1: Chronic heart failure with preserved ejection fraction- - NYHA class III - euvolemic now - weighing daily; reminded to call for an overnight weight gain of >2 pounds or a weekly weight gain of >5 pounds - weight down 3 pounds since last visit 1 week ago - not adding salt to her food except a "sprinkle"  when eating seafood.  - limited in diuretic use due to renal function - received 31m IV lasix/ 479mPO potassium twice in the last 2 weeks - will get BMP today and see if diuretic can be adjusted slightly; patient sees nephrology in 1 week - currently taking 3071morsemide QOD alternating with 79m64mD - saw cardiology  (WalGilford Rile13/21 & returns July 2021 - BNP 04/17/19 was 77.0 - has received flu vaccine this season. - discussed palliative care with patient and daughter. Daughter says that they've been involved with palliative care in the past and do recognize that more assistance is needed. They will discuss and let me know if they'd like to resume palliative care  2: HTN- - BP mildly elevated but better from last week - saw PCP (McLean-Scocuzza) 03/13/19 & returns April 2021 - BMP from 04/17/19 reviewed and showed sodium 139, potassium 4.1, creatinine 1.58 and GFR 33  3: DM- - A1c 03/13/19 was 8.3% - nonfasting glucose in clinic today was 147  4: Lymphedema- - stage 2 - saw vascular 08/23/2018 - elevating her legs "at times"; encouraged her to elevate her legs when sitting for long periods of time - has been wearing support socks daily & currently has both lower legs wrapped - limited in her ability to exercise due to her shortness of breath - had been wearing compression boots but hasn't worn them much since at her other daughter's house.   Medication bottles were reviewed.    Return in 3 months or sooner for any questions/problems before then.

## 2019-04-21 ENCOUNTER — Other Ambulatory Visit: Payer: Self-pay | Admitting: Internal Medicine

## 2019-04-21 DIAGNOSIS — E1122 Type 2 diabetes mellitus with diabetic chronic kidney disease: Secondary | ICD-10-CM

## 2019-04-21 MED ORDER — INSULIN LISPRO (1 UNIT DIAL) 100 UNIT/ML (KWIKPEN): PEN_INJECTOR | SUBCUTANEOUS | 11 refills | Status: DC

## 2019-04-21 MED ORDER — PEN NEEDLES 30G X 8 MM MISC: 1.0000 | Freq: Three times a day (TID) | 3 refills | Status: DC

## 2019-04-21 NOTE — Telephone Encounter (Signed)
Rx humalog on printer with a lot of details in instructions  [facility] or phone in   Thanks [name]

## 2019-04-22 ENCOUNTER — Other Ambulatory Visit (HOSPITAL_COMMUNITY)
Admission: RE | Admit: 2019-04-22 | Discharge: 2019-04-22 | Disposition: A | Discharge status: Home / Self Care | Payer: PPO | Source: Ambulatory Visit | Attending: Internal Medicine | Admitting: Internal Medicine

## 2019-04-22 ENCOUNTER — Ambulatory Visit: Payer: PPO | Attending: Family | Admitting: Family

## 2019-04-22 ENCOUNTER — Other Ambulatory Visit: Payer: Self-pay

## 2019-04-22 ENCOUNTER — Encounter: Payer: Self-pay | Admitting: Family

## 2019-04-22 ENCOUNTER — Other Ambulatory Visit: Payer: PPO

## 2019-04-22 VITALS — BP 161/83 | HR 71 | Resp 18 | Ht 60.0 in | Wt 184.6 lb

## 2019-04-22 DIAGNOSIS — I110 Hypertensive heart disease with heart failure: Secondary | ICD-10-CM | POA: Diagnosis not present

## 2019-04-22 DIAGNOSIS — N761 Subacute and chronic vaginitis: Secondary | ICD-10-CM

## 2019-04-22 DIAGNOSIS — Z7982 Long term (current) use of aspirin: Secondary | ICD-10-CM | POA: Insufficient documentation

## 2019-04-22 DIAGNOSIS — N184 Chronic kidney disease, stage 4 (severe): Secondary | ICD-10-CM | POA: Diagnosis not present

## 2019-04-22 DIAGNOSIS — I5032 Chronic diastolic (congestive) heart failure: Secondary | ICD-10-CM | POA: Diagnosis not present

## 2019-04-22 DIAGNOSIS — K219 Gastro-esophageal reflux disease without esophagitis: Secondary | ICD-10-CM | POA: Diagnosis not present

## 2019-04-22 DIAGNOSIS — Z79899 Other long term (current) drug therapy: Secondary | ICD-10-CM | POA: Insufficient documentation

## 2019-04-22 DIAGNOSIS — Z794 Long term (current) use of insulin: Secondary | ICD-10-CM | POA: Diagnosis not present

## 2019-04-22 DIAGNOSIS — I890 Lymphedema, not elsewhere classified: Secondary | ICD-10-CM | POA: Diagnosis not present

## 2019-04-22 DIAGNOSIS — Z8673 Personal history of transient ischemic attack (TIA), and cerebral infarction without residual deficits: Secondary | ICD-10-CM | POA: Insufficient documentation

## 2019-04-22 DIAGNOSIS — Z833 Family history of diabetes mellitus: Secondary | ICD-10-CM | POA: Insufficient documentation

## 2019-04-22 DIAGNOSIS — E785 Hyperlipidemia, unspecified: Secondary | ICD-10-CM | POA: Diagnosis not present

## 2019-04-22 DIAGNOSIS — E1122 Type 2 diabetes mellitus with diabetic chronic kidney disease: Secondary | ICD-10-CM

## 2019-04-22 DIAGNOSIS — I10 Essential (primary) hypertension: Secondary | ICD-10-CM

## 2019-04-22 DIAGNOSIS — I272 Pulmonary hypertension, unspecified: Secondary | ICD-10-CM | POA: Diagnosis not present

## 2019-04-22 DIAGNOSIS — E119 Type 2 diabetes mellitus without complications: Secondary | ICD-10-CM | POA: Insufficient documentation

## 2019-04-22 DIAGNOSIS — M109 Gout, unspecified: Secondary | ICD-10-CM | POA: Diagnosis not present

## 2019-04-22 DIAGNOSIS — E079 Disorder of thyroid, unspecified: Secondary | ICD-10-CM | POA: Insufficient documentation

## 2019-04-22 DIAGNOSIS — Z791 Long term (current) use of non-steroidal anti-inflammatories (NSAID): Secondary | ICD-10-CM | POA: Insufficient documentation

## 2019-04-22 DIAGNOSIS — I350 Nonrheumatic aortic (valve) stenosis: Secondary | ICD-10-CM | POA: Insufficient documentation

## 2019-04-22 LAB — BASIC METABOLIC PANEL
Anion gap: 9 (ref 5–15)
BUN: 24 mg/dL — ABNORMAL HIGH (ref 8–23)
CO2: 32 mmol/L (ref 22–32)
Calcium: 9.6 mg/dL (ref 8.9–10.3)
Chloride: 100 mmol/L (ref 98–111)
Creatinine, Ser: 1.4 mg/dL — ABNORMAL HIGH (ref 0.44–1.00)
GFR calc Af Amer: 38 mL/min — ABNORMAL LOW (ref >60)
GFR calc non Af Amer: 33 mL/min — ABNORMAL LOW (ref >60)
Glucose, Bld: 153 mg/dL — ABNORMAL HIGH (ref 70–99)
Potassium: 4 mmol/L (ref 3.5–5.1)
Sodium: 141 mmol/L (ref 135–145)

## 2019-04-22 LAB — GLUCOSE, CAPILLARY: Glucose-Capillary: 147 mg/dL — ABNORMAL HIGH (ref 70–99)

## 2019-04-22 NOTE — Patient Instructions (Signed)
Continue weighing daily and call for an overnight weight gain of > 2 pounds or a weekly weight gain of >5 pounds. 

## 2019-04-23 ENCOUNTER — Other Ambulatory Visit: Payer: Self-pay | Admitting: Internal Medicine

## 2019-04-23 ENCOUNTER — Telehealth: Payer: Self-pay | Admitting: Internal Medicine

## 2019-04-23 DIAGNOSIS — K219 Gastro-esophageal reflux disease without esophagitis: Secondary | ICD-10-CM

## 2019-04-23 LAB — MICROALBUMIN / CREATININE URINE RATIO
Creatinine, Urine: 162 mg/dL (ref 20–275)
Microalb Creat Ratio: 4 mcg/mg creat (ref <30)
Microalb, Ur: 0.7 mg/dL

## 2019-04-23 MED ORDER — PANTOPRAZOLE SODIUM 40 MG PO TBEC: 40.0000 mg | DELAYED_RELEASE_TABLET | Freq: Every day | ORAL | 3 refills | Status: AC

## 2019-04-23 NOTE — Telephone Encounter (Signed)
Informed patient's daughter of medication being sent in.

## 2019-04-23 NOTE — Telephone Encounter (Signed)
[facility] In [place] called. Patients insulin needs prior arthro or changed medication. [facility]

## 2019-04-23 NOTE — Telephone Encounter (Signed)
PA ok or call pharmacy what medication do they prefer?  If not pen then do PA please Uncontrolled DM   TMS

## 2019-04-23 NOTE — Telephone Encounter (Signed)
Okay for PA?

## 2019-04-24 NOTE — Telephone Encounter (Signed)
Patient's daughter's husband went to pick up patient's insulin today and it still has not been filled. Pt's daughter says this is a new medication Dr. Olivia Mackie called in and never received.

## 2019-04-24 NOTE — Telephone Encounter (Signed)
Preferred insulin in Novolog.

## 2019-04-26 ENCOUNTER — Other Ambulatory Visit: Payer: Self-pay | Admitting: Internal Medicine

## 2019-04-26 DIAGNOSIS — N760 Acute vaginitis: Secondary | ICD-10-CM

## 2019-04-26 DIAGNOSIS — B9689 Other specified bacterial agents as the cause of diseases classified elsewhere: Secondary | ICD-10-CM

## 2019-04-26 LAB — URINE CYTOLOGY ANCILLARY ONLY
Bacterial Vaginitis-Urine: POSITIVE — AB
Bacterial Vaginitis-Urine: POSITIVE — AB
Candida Urine: NEGATIVE

## 2019-04-26 MED ORDER — METRONIDAZOLE 500 MG PO TABS: 500.0000 mg | ORAL_TABLET | Freq: Two times a day (BID) | ORAL | 1 refills | Status: DC

## 2019-04-28 ENCOUNTER — Other Ambulatory Visit: Payer: Self-pay | Admitting: Internal Medicine

## 2019-04-28 DIAGNOSIS — E1165 Type 2 diabetes mellitus with hyperglycemia: Secondary | ICD-10-CM

## 2019-04-28 MED ORDER — NOVOLOG FLEXPEN 100 UNIT/ML ~~LOC~~ SOPN: PEN_INJECTOR | SUBCUTANEOUS | 11 refills | Status: DC

## 2019-04-28 NOTE — Telephone Encounter (Signed)
[facility] [place]  Rx on printer   Thanks [employer]

## 2019-04-29 DIAGNOSIS — N1832 Chronic kidney disease, stage 3b: Secondary | ICD-10-CM | POA: Diagnosis not present

## 2019-04-29 DIAGNOSIS — E876 Hypokalemia: Secondary | ICD-10-CM | POA: Diagnosis not present

## 2019-04-29 DIAGNOSIS — I129 Hypertensive chronic kidney disease with stage 1 through stage 4 chronic kidney disease, or unspecified chronic kidney disease: Secondary | ICD-10-CM | POA: Diagnosis not present

## 2019-04-29 DIAGNOSIS — E1122 Type 2 diabetes mellitus with diabetic chronic kidney disease: Secondary | ICD-10-CM | POA: Diagnosis not present

## 2019-04-29 MED ORDER — NOVOLOG FLEXPEN 100 UNIT/ML ~~LOC~~ SOPN: PEN_INJECTOR | SUBCUTANEOUS | 11 refills | Status: AC

## 2019-04-29 NOTE — Addendum Note (Signed)
Addended by: Thressa Sheller on: 04/29/2019 11:52 AM   Modules accepted: Orders

## 2019-04-29 NOTE — Telephone Encounter (Signed)
Patient's daughter informed. States she will go to pick up the medication.

## 2019-04-29 NOTE — Telephone Encounter (Signed)
[facility]-sent as [facility] as [facility] Olivia Mackie is not in office to sign the printed RX. Receipt confirmed by pharmacy as of 11:57 am.  Cost is 143.46.

## 2019-05-01 ENCOUNTER — Encounter: Payer: Self-pay | Admitting: Internal Medicine

## 2019-05-01 NOTE — Progress Notes (Unsigned)
Office progress notes from [place] [facility] placed on Dr [employer].

## 2019-05-06 ENCOUNTER — Telehealth: Payer: Self-pay | Admitting: Internal Medicine

## 2019-05-06 ENCOUNTER — Other Ambulatory Visit: Payer: Self-pay | Admitting: Internal Medicine

## 2019-05-06 DIAGNOSIS — E119 Type 2 diabetes mellitus without complications: Secondary | ICD-10-CM

## 2019-05-06 DIAGNOSIS — E1122 Type 2 diabetes mellitus with diabetic chronic kidney disease: Secondary | ICD-10-CM

## 2019-05-06 DIAGNOSIS — I10 Essential (primary) hypertension: Secondary | ICD-10-CM

## 2019-05-06 DIAGNOSIS — E785 Hyperlipidemia, unspecified: Secondary | ICD-10-CM

## 2019-05-06 DIAGNOSIS — N184 Chronic kidney disease, stage 4 (severe): Secondary | ICD-10-CM

## 2019-05-06 MED ORDER — LOVASTATIN 20 MG PO TABS: 20.0000 mg | ORAL_TABLET | Freq: Every day | ORAL | 3 refills | Status: AC

## 2019-05-06 NOTE — Telephone Encounter (Signed)
Pt daughter called and wanted to know if there was something that could be called in for her mother to help sleep.. she is very restless at night

## 2019-05-06 NOTE — Telephone Encounter (Signed)
Please advise 

## 2019-05-07 NOTE — Telephone Encounter (Signed)
Patient daughter informed.  [name] states that she has ordered the multi-vitamin with iron in it for the patient. [name] is wanting to know if the patient should continue to take her liquid iron supplement once she starts the multi-vitamin? Please advise

## 2019-05-07 NOTE — Telephone Encounter (Signed)
Can try melatonin 3-6 mg at night over the counter   [place]

## 2019-05-10 ENCOUNTER — Other Ambulatory Visit: Payer: Self-pay | Admitting: Internal Medicine

## 2019-05-10 ENCOUNTER — Telehealth: Payer: Self-pay | Admitting: Internal Medicine

## 2019-05-10 DIAGNOSIS — G629 Polyneuropathy, unspecified: Secondary | ICD-10-CM

## 2019-05-10 DIAGNOSIS — E119 Type 2 diabetes mellitus without complications: Secondary | ICD-10-CM

## 2019-05-10 MED ORDER — GABAPENTIN 300 MG PO CAPS: 300.0000 mg | ORAL_CAPSULE | Freq: Two times a day (BID) | ORAL | 3 refills | Status: AC

## 2019-05-10 NOTE — Telephone Encounter (Signed)
Pt's daughter returned called.

## 2019-05-10 NOTE — Telephone Encounter (Signed)
Left message to return call 

## 2019-05-10 NOTE — Telephone Encounter (Signed)
Pt daughter called wanting to talk about her medication and maybe causing her blood sugar to drop

## 2019-05-13 MED ORDER — LANCET DEVICE MISC: 1.0000 | 0 refills | Status: AC

## 2019-05-13 NOTE — Telephone Encounter (Signed)
Noted  TMS 

## 2019-05-13 NOTE — Addendum Note (Signed)
Addended by: Thressa Sheller on: 05/13/2019 03:23 PM   Modules accepted: Orders

## 2019-05-13 NOTE — Telephone Encounter (Signed)
Patient' daughter informed and verbalized understanding.   States that the lancing device for patient's meter is missing and they are needing a new one. [name] states that the pharmacy gave them a refill of lancets but not the device. Medication sent in to preferred pharmacy per protocol.

## 2019-05-13 NOTE — Telephone Encounter (Signed)
Changed patient to sugar free drinks and low sugar cocoa. Her sugars has been up and down based off of her eating habits.   States the lowest her sugars have been was 98 for a few hours. Has been between 130-180. Patient has been having to take around 2 units daily to regulate. Patient has also recently been on an antibiotic.   For your information

## 2019-05-13 NOTE — Telephone Encounter (Signed)
Inform daughter  Noted sugars ok thank you for changing foods and drinks Iron liquid 1x per day and daily mvt with iron ok  -if she is getting too constipated let me know and we will likely stop liquid iron  TMS

## 2019-05-14 ENCOUNTER — Telehealth: Payer: Self-pay | Admitting: Internal Medicine

## 2019-05-14 NOTE — Telephone Encounter (Signed)
Pt's daughter would like a call back regarding she thinks her mother's infection has come back.

## 2019-05-14 NOTE — Telephone Encounter (Signed)
Last note:  McLean-Scocuzza, Nino Glow, MD  04/26/2019  6:03 PM EST    Inform daughter crystal    No bladder infection Bacterial vaginosis + rec flagyl 2x per day x 1 week if not better then repeat another dose  You have bacterial vaginosis not yeast infection    Patient is back itching since last night. Patient had finished antibiotic and was okay for a bit. No other known symptoms.

## 2019-05-15 ENCOUNTER — Other Ambulatory Visit: Payer: Self-pay | Admitting: Internal Medicine

## 2019-05-15 DIAGNOSIS — B373 Candidiasis of vulva and vagina: Secondary | ICD-10-CM

## 2019-05-15 DIAGNOSIS — B3731 Acute candidiasis of vulva and vagina: Secondary | ICD-10-CM

## 2019-05-15 DIAGNOSIS — B9689 Other specified bacterial agents as the cause of diseases classified elsewhere: Secondary | ICD-10-CM

## 2019-05-15 IMAGING — DX DG KNEE COMPLETE 4+V*L*
5 series · 5 of 5 positions shown · non-contrast
Comparison: None.

CLINICAL DATA: Left knee pain and weakness.  No known injury.

EXAM:
LEFT KNEE - COMPLETE 4+ VIEW

[knee standing ap]
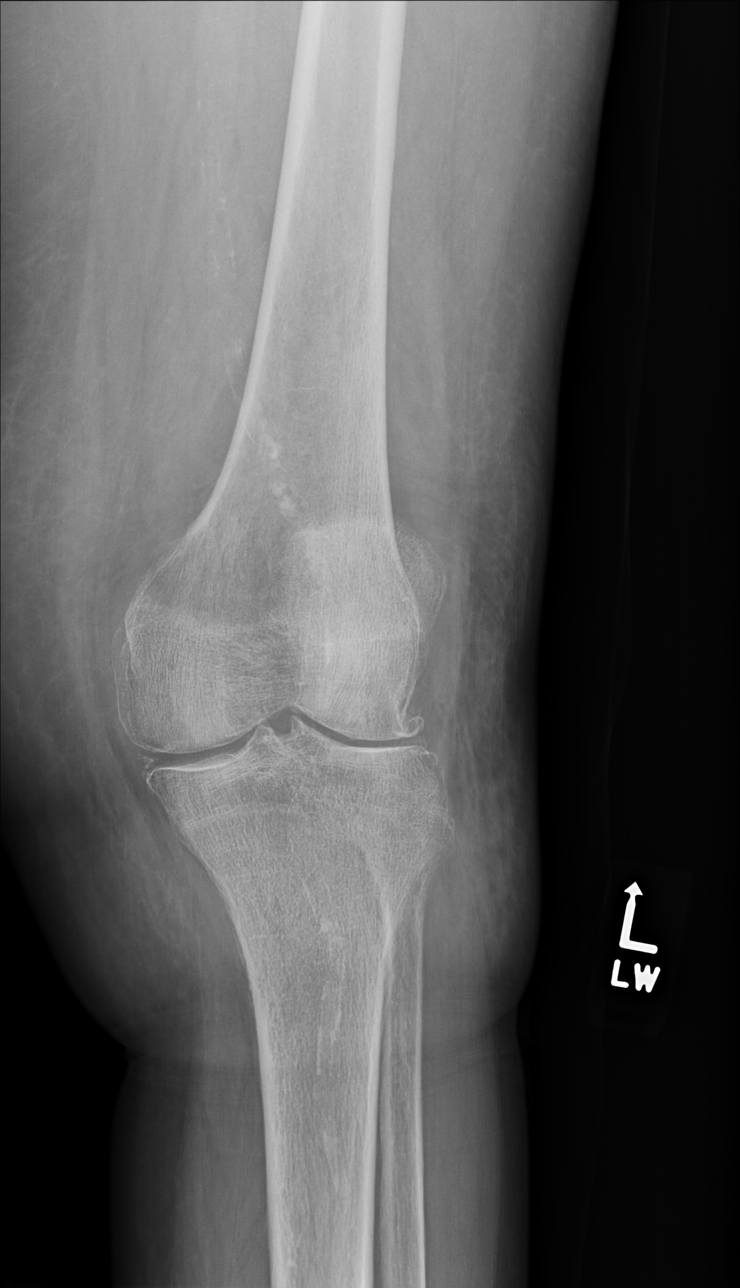

[knee standing external ap]
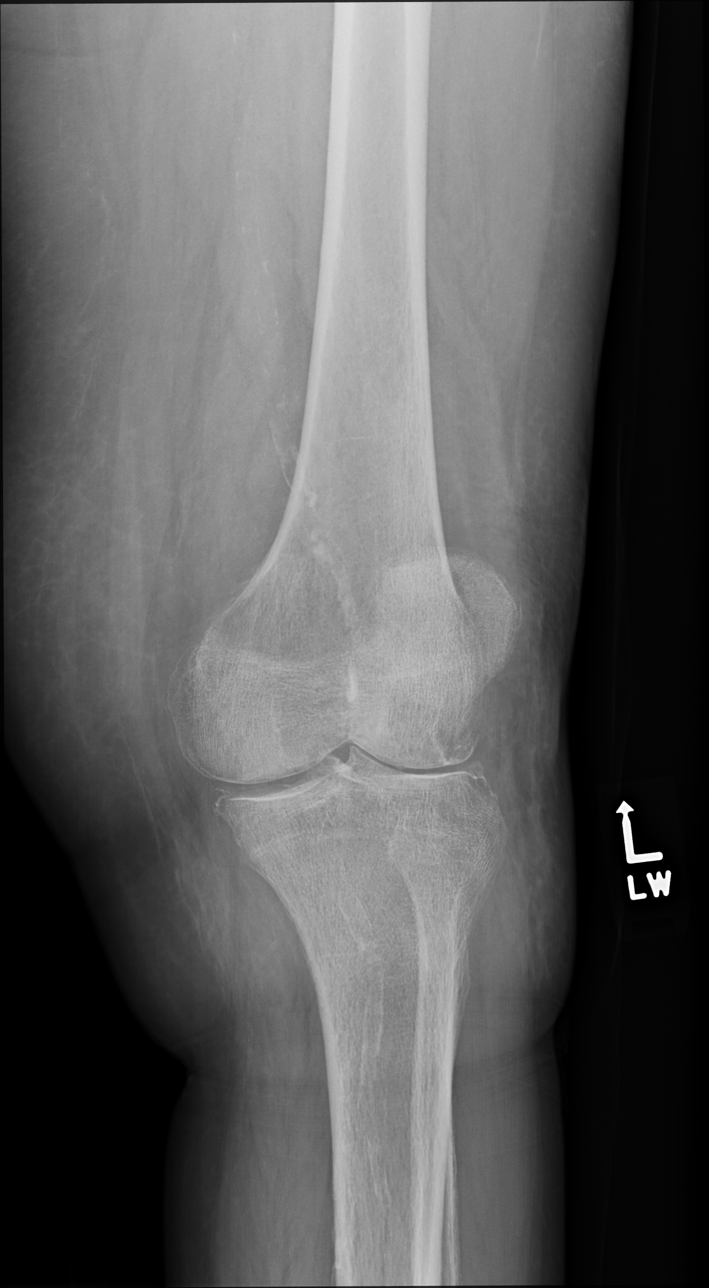

[knee standing internal ap]
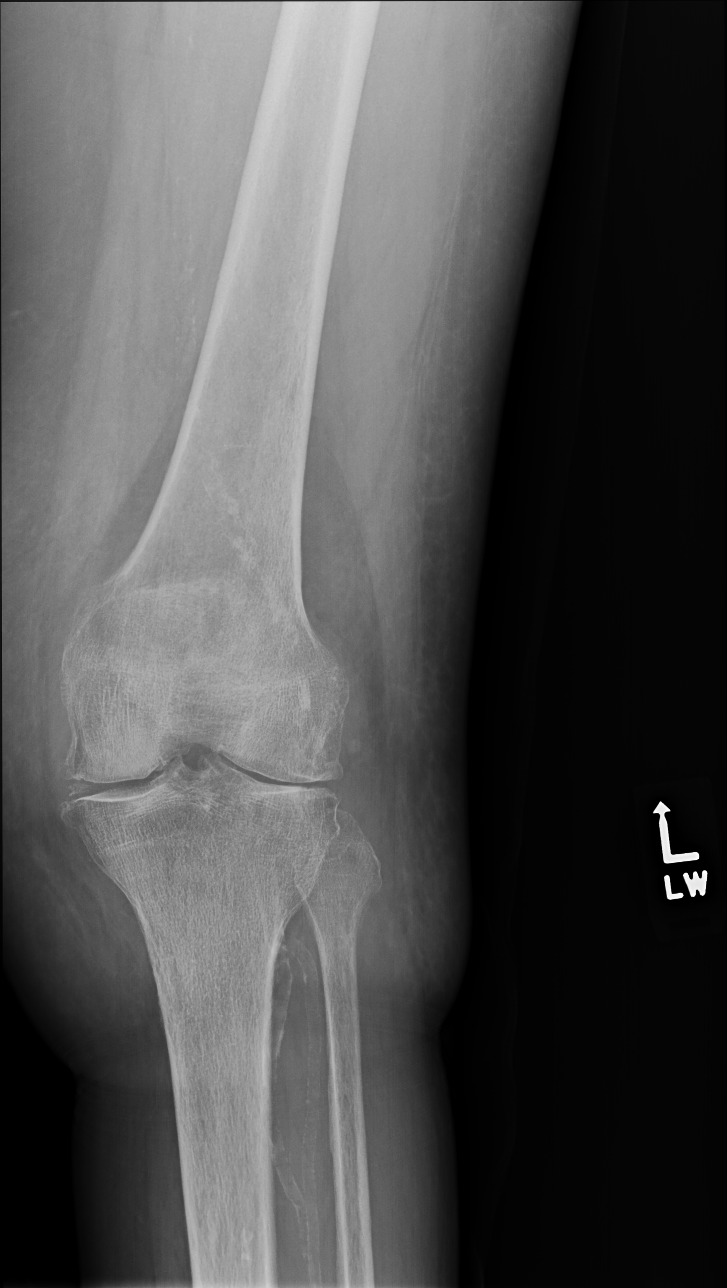

[knee standing lat]
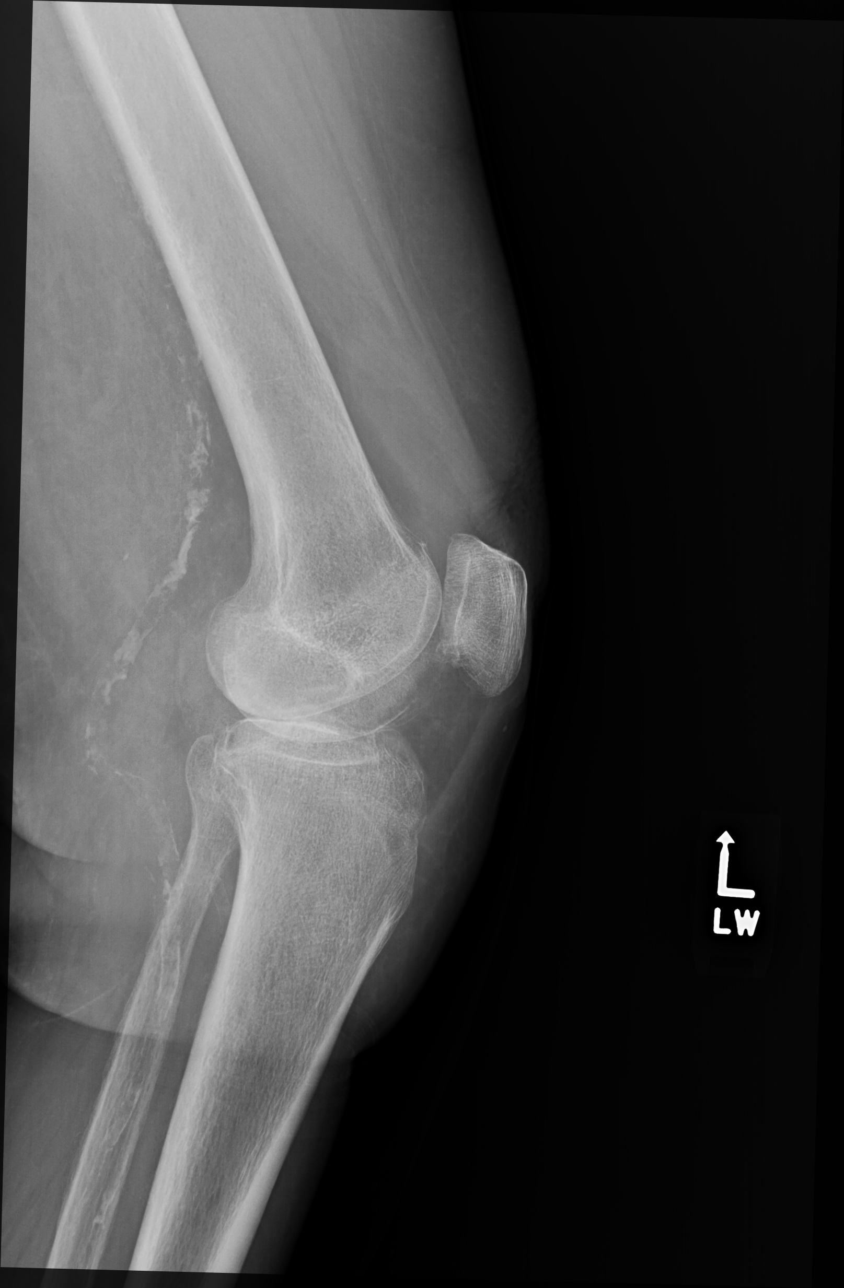

[knee [person_name] view pa]
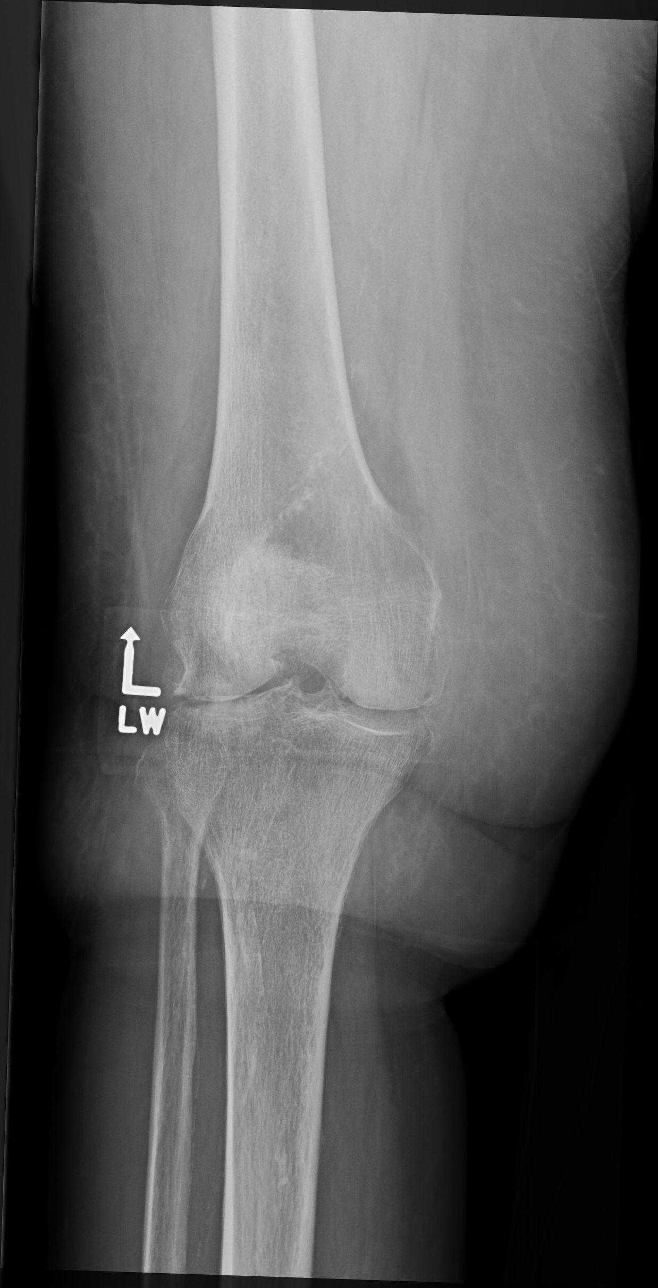

[5 of 5 positions shown; findings below may reference images not displayed]

FINDINGS: No fracture or dislocation is identified. Mild-to-moderate
osteoarthritis about the knee appears worst in the lateral
compartment. Chondrocalcinosis is noted. Small joint effusion is
seen. Atherosclerosis is identified.
IMPRESSION: No acute abnormality.

Mild to moderate osteoarthritis appears worst in the lateral
compartment.

Chondrocalcinosis.

Atherosclerosis.

## 2019-05-15 MED ORDER — FLUCONAZOLE 150 MG PO TABS: 150.0000 mg | ORAL_TABLET | Freq: Once | ORAL | 0 refills | Status: AC

## 2019-05-15 NOTE — Telephone Encounter (Signed)
She has 1 refill of flagyl 2x per day x 1 week  Sent diflucan x 1 dose  If she keeps having issues let me know will refer to gynecology  [place]

## 2019-05-15 NOTE — Telephone Encounter (Signed)
I am ok with these #s I dont want to see <70 if getting this call the clinic

## 2019-05-15 NOTE — Telephone Encounter (Signed)
Patient's daughter informed and she will go pick up these medications.   Patient's daughter would like to know if the patient's sliding scale needs to be adjusted with her blood sugars getting lower. States her fasting sugars are around 130 and 131, they are then giving the patient the 2 units. States the sugars are then dropping to 98 - 112.   Please advise

## 2019-05-16 NOTE — Telephone Encounter (Signed)
Patient's daughter is informed

## 2019-05-22 DIAGNOSIS — E1122 Type 2 diabetes mellitus with diabetic chronic kidney disease: Secondary | ICD-10-CM | POA: Diagnosis not present

## 2019-05-22 DIAGNOSIS — E876 Hypokalemia: Secondary | ICD-10-CM | POA: Diagnosis not present

## 2019-05-22 DIAGNOSIS — I129 Hypertensive chronic kidney disease with stage 1 through stage 4 chronic kidney disease, or unspecified chronic kidney disease: Secondary | ICD-10-CM | POA: Diagnosis not present

## 2019-05-22 DIAGNOSIS — N1832 Chronic kidney disease, stage 3b: Secondary | ICD-10-CM | POA: Diagnosis not present

## 2019-05-27 ENCOUNTER — Telehealth: Payer: Self-pay | Admitting: Internal Medicine

## 2019-05-27 ENCOUNTER — Telehealth: Payer: Self-pay | Admitting: Cardiovascular Disease

## 2019-05-27 NOTE — Telephone Encounter (Signed)
Pt's daughter would like a call back regarding pt's blood pressure being low. She said she need to discuss her blood pressure medications.

## 2019-05-27 NOTE — Telephone Encounter (Signed)
Pt c/o medication issue:  1. Name of Medication: torsemide   2. How are you currently taking this medication (dosage and times per day)? Depends gives as needed   3. Are you having a reaction (difficulty breathing--STAT)?  no  4. What is your medication issue?  Patient daughter advised by nephrology to limit torsemide as BP is low .  She is concerned that this dose change may cause issues controlling swelling please call to discuss.   BP today 114/68 HR 81

## 2019-05-28 NOTE — Telephone Encounter (Signed)
Per nephrology notes she may take up to 40mg  of Torsemide as needed.   Reducing Metoprolol would not likely have significant impact on BP at home. As long as systolic BP >568, okay to take Torsemide. Recommend she make position changes slowly to prevent orthostatic hypotension. Recommend she elevate slower extremities when sitting. Continue low salt diet.   187lb is similar to last office visit. Recommend weighing daily and keeping a log. Report weight gain of 2lb overnight or 5lb in one week to HF clinic. She follows closely with Darylene Price. May benefit from sooner appointment with HF clinic if there is significant concern regarding fluid retention.   Loel Dubonnet, NP

## 2019-05-28 NOTE — Telephone Encounter (Signed)
Spoke with patients daughter per release form and reviewed provider recommendations. Advised that I would send message to Otila Kluver to see if they can get her in sooner to review all of these changes. She verbalized understanding of our conversation, agreement with plan, and has no further questions at this time.

## 2019-05-28 NOTE — Telephone Encounter (Signed)
Spoke with patients daughter per release form and she states nephrology decreased torsemide and now they notice more swelling. She is concerned that this decrease is not in her best interest and wondered if maybe her metoprolol could be decreased or discontinued to allow patient to take the torsemide dosing. She states this change was due to her low blood pressures. She states that 187.6 pounds yesterday and has noticed daily increases. She wears compression pumps and compression socks with wraps. Advised that I would send this over to provider for review and recommendations and would be in touch.

## 2019-05-28 NOTE — Telephone Encounter (Signed)
Pt's daughter states the nephrologist reduced patient's Torsemide. Pt's swelling has started back up.  She was wanting to know if any of her BP medications could be reduced as she can not take the Torsemide with BP less than 110 on the top.   BP has been 113/52 on last Thursday night 3/25. BP was 130/76 with pt off the torsemide 3/23.   Please advise

## 2019-05-30 ENCOUNTER — Ambulatory Visit: Payer: PPO | Admitting: Family

## 2019-06-03 ENCOUNTER — Ambulatory Visit (INDEPENDENT_AMBULATORY_CARE_PROVIDER_SITE_OTHER): Payer: PPO | Admitting: Podiatry

## 2019-06-03 ENCOUNTER — Other Ambulatory Visit: Payer: Self-pay

## 2019-06-03 ENCOUNTER — Encounter: Payer: Self-pay | Admitting: Podiatry

## 2019-06-03 VITALS — Temp 98.6°F

## 2019-06-03 DIAGNOSIS — B351 Tinea unguium: Secondary | ICD-10-CM

## 2019-06-03 DIAGNOSIS — M79675 Pain in left toe(s): Secondary | ICD-10-CM

## 2019-06-03 DIAGNOSIS — I890 Lymphedema, not elsewhere classified: Secondary | ICD-10-CM

## 2019-06-03 DIAGNOSIS — N184 Chronic kidney disease, stage 4 (severe): Secondary | ICD-10-CM

## 2019-06-03 DIAGNOSIS — E1142 Type 2 diabetes mellitus with diabetic polyneuropathy: Secondary | ICD-10-CM | POA: Diagnosis not present

## 2019-06-03 DIAGNOSIS — D689 Coagulation defect, unspecified: Secondary | ICD-10-CM

## 2019-06-03 DIAGNOSIS — I739 Peripheral vascular disease, unspecified: Secondary | ICD-10-CM

## 2019-06-03 DIAGNOSIS — M79674 Pain in right toe(s): Secondary | ICD-10-CM

## 2019-06-03 NOTE — Telephone Encounter (Signed)
If cardiology can weigh in low low BP  She is only on Toprol 25 mg xl and torsemide appears dose fluctuates from 20 to up to 40 mg but unclear parameters   BP at kidney appt recently was sbp 160s  Arianna inform daughter this should be a discussion between cardiology and renal if possible  Renal Dr. Wendy Poet [place]   Daughter needs holding parameters for BB and Torsemide please from both specialist if they can inform daughter crystal and weigh in   Thank you  Fax message to him as well renal doc above [name]     Nitro

## 2019-06-03 NOTE — Progress Notes (Signed)
This patient returns to my office for at risk foot care.  This patient requires this care by a professional since this patient will be at risk due to having diabetic neuropathy  PAD  And  CKD.   This patient is unable to cut nails herself since the patient cannot reach her nails.These nails are painful walking and wearing shoes.  This patient presents for at risk foot care today.  General Appearance  Alert, conversant and in no acute stress.  Vascular  Dorsalis pedis and posterior tibial  pulses are  Weakly  palpable  bilaterally.  Capillary return is within normal limits  bilaterally. Temperature is within normal limits  bilaterally.  Neurologic  Senn-Weinstein monofilament wire test within normal limits  bilaterally. Muscle power within normal limits bilaterally.  Nails Thick disfigured discolored nails with subungual debris  from hallux to fifth toes bilaterally. No evidence of bacterial infection or drainage bilaterally.  Orthopedic  No limitations of motion  feet .  No crepitus or effusions noted.  No bony pathology or digital deformities noted.  HAV  B/L>  Skin  normotropic skin with no porokeratosis noted bilaterally.  No signs of infections or ulcers noted.     Onychomycosis  Pain in right toes  Pain in left toes  Consent was obtained for treatment procedures.   Mechanical debridement of nails 1-5  bilaterally performed with a nail nipper.  Filed with dremel without incident.    Return office visit   3 months                   Told patient to return for periodic foot care and evaluation due to potential at risk complications.   Gardiner Barefoot DPM

## 2019-06-04 ENCOUNTER — Other Ambulatory Visit: Payer: Self-pay | Admitting: Internal Medicine

## 2019-06-04 ENCOUNTER — Telehealth: Payer: Self-pay | Admitting: Family

## 2019-06-04 DIAGNOSIS — F329 Major depressive disorder, single episode, unspecified: Secondary | ICD-10-CM

## 2019-06-04 DIAGNOSIS — L8990 Pressure ulcer of unspecified site, unspecified stage: Secondary | ICD-10-CM

## 2019-06-04 DIAGNOSIS — F32A Depression, unspecified: Secondary | ICD-10-CM

## 2019-06-04 DIAGNOSIS — M109 Gout, unspecified: Secondary | ICD-10-CM

## 2019-06-04 MED ORDER — ALLOPURINOL 100 MG PO TABS: 100.0000 mg | ORAL_TABLET | Freq: Every day | ORAL | 3 refills | Status: AC

## 2019-06-04 MED ORDER — CLOTRIMAZOLE 1 % EX CREA: TOPICAL_CREAM | CUTANEOUS | 11 refills | Status: AC

## 2019-06-04 MED ORDER — SERTRALINE HCL 50 MG PO TABS: 50.0000 mg | ORAL_TABLET | Freq: Every day | ORAL | 3 refills | Status: AC

## 2019-06-04 NOTE — Telephone Encounter (Signed)
Spoke with daughter, [name], about patient's torsemide dose. She says that she was confused about how she should be giving this based on nephrology recommendation. Per nephrology note from 05/22/19, she can take 40mg  torsemide daily as long as her BP is ok.   [name] says that her recent BP's have been 140/85, 120/69 & 136/76  She gave patient 40mg  torsemide on 4/2, 30mg  on 4/3, none on 4/4 and then 30mg  on 4/5 & 4/6. [name] is going out of town for a few days and wants to make sure everything is set up correctly before she leaves.   Advised [name] that based on BP readings, she can give 40mg  torsemide QOD and alternate with 30mg  QOD. Patient has an appointment currently scheduled with the HF clinic on 06/10/19 and she will keep that appointment.

## 2019-06-05 ENCOUNTER — Other Ambulatory Visit: Payer: Self-pay | Admitting: Internal Medicine

## 2019-06-05 DIAGNOSIS — G4700 Insomnia, unspecified: Secondary | ICD-10-CM

## 2019-06-05 MED ORDER — TRAZODONE HCL 100 MG PO TABS: 50.0000 mg | ORAL_TABLET | Freq: Every evening | ORAL | 3 refills | Status: AC | PRN

## 2019-06-06 NOTE — Telephone Encounter (Signed)
Hard to go only off 2 BP numbers, But they could cut the metoprolol in 1/2 daily Stay on torsemide

## 2019-06-07 NOTE — Telephone Encounter (Signed)
Hard to go only off 2 BP numbers, But they could cut the metoprolol in 1/2 daily Stay on torsemide      Documentation   Per cardiology  Cut metoprolol in 1/2 daily if BP running low sbp <110 and dbp <60

## 2019-06-07 NOTE — Telephone Encounter (Signed)
Return call to daughter [name] to review POC.   She verbalized understanding and agreeable to POC from PCP and Dr. Rockey Situ.   Advised pt to call for any further questions or concerns.

## 2019-06-08 NOTE — Progress Notes (Signed)
Patient ID: [name], female    DOB: 09-06-1924, 86 y.o.   MRN: [id number]  HPI  [name] is a 86 y/o female with a history of Dm, hyperlipidemia, HTN, stroke, thyroid disease, GERD, anemia, SVT, severe aortic stenosis and chronic heart failure.   Echo report from 03/09/2018 reviewed and showed an EF of 50-55% along with mildly elevated PA pressure of 104m Hg.   Catheterization done 10/26/16 reviewed and showed: Patent coronary arteries with the exception of severe stenosis in the right PDA. There is mild nonobstructive stenosis of the LAD and RCA and a widely patent left main and left circumflex 2. Moderate to severe pulmonary hypertension with pulmonary artery pressure of 74/21 with a mean of 45 and a pulmonary capillary wedge pressure of 23 with a 32 mmHg T-wave 3. Mean transaortic valve gradient of 17 mmHg and calculated aortic valve area of 1.0 cm, suspect contamination of the pressure gradient with use of an AL-1 catheter and difficult measurement because of frequent ectopy  Has not been admitted or been in the ED in the last 6 months.   She presents today for a follow-up visit with a chief complaint of moderate fatigue upon minimal exertion. She describes this as chronic in nature having been present for several years. She has associated shortness of breath, pedal edema and difficulty sleeping along with this. She denies any dizziness, abdominal distention, palpitations, chest pain, cough or weight gain.   Currently taking torsemide 346mQOD/ 4056mOD and this appears to be keeping everything stable right now.   Past Medical History:  Diagnosis Date  . Anemia   . Carotid arterial disease (HCC[place]  a. 08/2014 U/S: Bilateral < 50% stenosis. Patent vertebrals w/ antegrade flow.   . Cervical spine arthritis   . Chronic diastolic CHF (congestive heart failure) (HCCDelta  a. echo 2014: EF 55-60%, DD;  b. 08/2014 Echo: EF 55-60%, no RWMA, GR1DD; c. 02/2016 Echo: EF 65-70%, Gr1  DD.  . CMarland Kitchengnitive impairment   . Depression   . Diabetes mellitus without complication ([place] . Essential hypertension   . GERD (gastroesophageal reflux disease)   . Glaucoma   . Gout   . History of renal impairment   . Hyperlipidemia   . Hypotension    a. Related to Norvasc - 11/2014 ED visit.  . Multinodular goiter    a. Noted incidentally on CT 07/2012 and carotid U/S 08/2014;  b. Nl TSH 11/2014.  . PMarland Kitchenroxysmal SVT (supraventricular tachycardia) (HCC)    a. on Toprol   . Prolapsed uterus   . Severe aortic stenosis    a. 11/15/16: s/p TAVR Edwards Sapien 3 THV (size 23 mm, model # 9600TFX, serial # [id number]. [facility]  a. CT head 07/2014 showed small thalamic infarct  . Vitamin deficiency    Past Surgical History:  Procedure Laterality Date  . EYE SURGERY     unsure of what procedure, did know that laser was used  . IR RADIOLOGY PERIPHERAL GUIDED IV START  11/03/2016  . IR US [place] VASC ACCESS RIGHT  11/03/2016  . RIGHT/LEFT HEART CATH AND CORONARY ANGIOGRAPHY N/A 10/26/2016   Procedure: RIGHT/LEFT HEART CATH AND CORONARY ANGIOGRAPHY;  Surgeon: CooSherren MochaD;  Location: [facility];  Service: Cardiovascular;  Laterality: N/A;  . TEE WITHOUT CARDIOVERSION N/A 03/31/2016   Procedure: TRANSESOPHAGEAL ECHOCARDIOGRAM (TEE);  Surgeon: TimMinna MerrittsD;  Location: [facility];  Service:  Cardiovascular;  Laterality: N/A;  . TEE WITHOUT CARDIOVERSION N/A 11/15/2016   Procedure: TRANSESOPHAGEAL ECHOCARDIOGRAM (TEE);  Surgeon: [name], MD;  Location: [place];  Service: Open Heart Surgery;  Laterality: N/A;  . TRANSCATHETER AORTIC VALVE REPLACEMENT, TRANSFEMORAL N/A 11/15/2016   Procedure: TRANSCATHETER AORTIC VALVE REPLACEMENT, TRANSFEMORAL;  Surgeon: [name], MD;  Location: [place];  Service: Open Heart Surgery;  Laterality: N/A;  . TUBAL LIGATION     Family History  Problem Relation Age of Onset  . Glaucoma Mother   . Diabetes Mother   . Peptic Ulcer Father   .  Colitis Father    Social History   Tobacco Use  . Smoking status: Never Smoker  . Smokeless tobacco: Never Used  Substance Use Topics  . Alcohol use: No   Allergies  Allergen Reactions  . Metformin Hcl Other (See Comments)    GI upset  . Metformin Other (See Comments), Diarrhea and Nausea And Vomiting    Other reaction(s): Other (See Comments), Unknown  . Metformin And Related Diarrhea   Prior to Admission medications   Medication Sig Start Date End Date Taking? Authorizing Provider  ACCU-CHEK AVIVA PLUS test strip USE 1 STRIP TO TEST BLOOD SUGARS 3 TIMES A DAY 04/15/18  Yes McLean-Scocuzza, Nino Glow, MD  acetaminophen (TYLENOL) 500 MG tablet Take 1 tablet (500 mg total) by mouth every 6 (six) hours as needed for mild pain or headache. 04/15/18  Yes McLean-Scocuzza, Nino Glow, MD  allopurinol (ZYLOPRIM) 100 MG tablet Take 1 tablet (100 mg total) by mouth daily. 06/04/19  Yes McLean-Scocuzza, Nino Glow, MD  aspirin 81 MG chewable tablet Chew 1 tablet (81 mg total) by mouth daily. 04/15/18  Yes McLean-Scocuzza, Nino Glow, MD  blood glucose meter kit and supplies Dispense based on patient and insurance preference. Use 1x daily as directed. (FOR ICD-10 E10.9, E11.9). 11/30/17  Yes McLean-Scocuzza, Nino Glow, MD  brimonidine (ALPHAGAN P) 0.1 % SOLN Place 1 drop into the right eye 2 (two) times daily.   Yes [provider]  cholecalciferol (VITAMIN D) 25 MCG (1000 UT) tablet Take 1 tablet (1,000 Units total) by mouth daily. 04/15/18  Yes McLean-Scocuzza, Nino Glow, MD  fluticasone (FLONASE) 50 MCG/ACT nasal spray Place 1 spray into both nostrils daily. Prn max 2 sprays total each nose 04/15/18  Yes McLean-Scocuzza, Nino Glow, MD  gabapentin (NEURONTIN) 300 MG capsule Take 1 capsule (300 mg total) by mouth 2 (two) times daily. 05/10/19  Yes McLean-Scocuzza, Nino Glow, MD  glipiZIDE (GLUCOTROL) 5 MG tablet Take 0.5 tablets (2.5 mg total) by mouth daily before breakfast. 04/17/19  Yes McLean-Scocuzza, Nino Glow,  MD  glucose blood test strip Accu-Chek Aviva Plus test strips 04/15/18  Yes McLean-Scocuzza, Nino Glow, MD  glucose blood test strip Bid Use as instructed E11.9 12/28/18  Yes McLean-Scocuzza, Nino Glow, MD  insulin aspart (NOVOLOG FLEXPEN) 100 UNIT/ML FlexPen TID before meals 15-30 minutes based on sugar reading before meals If sugar: 70-130 0 units, 131-180 2 units, 181-240 4 units, 241-300 6 units, 301-350 8 units, 351-400 10 units, >400 12 units and call doctor 04/29/19  Yes McLean-Scocuzza, Nino Glow, MD  Insulin Pen Needle (PEN NEEDLES) 30G X 8 MM MISC 1 Device by Does not apply route once a week. 03/13/19  Yes McLean-Scocuzza, Nino Glow, MD  Insulin Pen Needle (PEN NEEDLES) 30G X 8 MM MISC 1 Device by Does not apply route 3 (three) times daily before meals. 04/21/19  Yes McLean-Scocuzza, Nino Glow, MD  Lancet  Device MISC 1 Device by Does not apply route as directed. 05/13/19  Yes McLean-Scocuzza, Nino Glow, MD  latanoprost (XALATAN) 0.005 % ophthalmic solution Place 1 drop into the right eye at bedtime.    Yes [provider]  LORazepam (ATIVAN) 0.5 MG tablet Take 0.5 mg by mouth every 8 (eight) hours as needed for anxiety.   Yes [provider]  lovastatin (MEVACOR) 20 MG tablet Take 1 tablet (20 mg total) by mouth at bedtime. Note reduced dose due to age and chronic medical issues d/c 40 mg dose 05/06/19  Yes McLean-Scocuzza, Nino Glow, MD  metoprolol succinate (TOPROL-XL) 25 MG 24 hr tablet Take 1 tablet (25 mg total) by mouth daily. 04/15/18  Yes McLean-Scocuzza, Nino Glow, MD  Multiple Vitamin (MULTI-VITAMIN PO) Take 1 tablet by mouth daily.   Yes [provider]  pantoprazole (PROTONIX) 40 MG tablet Take 1 tablet (40 mg total) by mouth daily. 30 minutes before breakfast OR dinner 04/23/19  Yes McLean-Scocuzza, Nino Glow, MD  pilocarpine (PILOCAR) 4 % ophthalmic solution Place 1 drop into the right eye 4 (four) times daily.    Yes [provider]  polyethylene glycol (MIRALAX) packet  Take 17 g by mouth daily. prn 04/15/18  Yes McLean-Scocuzza, Nino Glow, MD  Potassium Chloride ER 20 MEQ TBCR TAKE 1 TABLET BY MOUTH EVERY DAY WITH FLUID PILL 04/16/19  Yes Gollan, Kathlene November, MD  Semaglutide,0.25 or 0.'5MG'$ /DOS, (OZEMPIC, 0.25 OR 0.5 MG/DOSE,) 2 MG/1.5ML SOPN Inject 0.5 mg into the skin once a week. 04/17/19  Yes McLean-Scocuzza, Nino Glow, MD  sertraline (ZOLOFT) 50 MG tablet Take 1 tablet (50 mg total) by mouth daily. 06/04/19  Yes McLean-Scocuzza, Nino Glow, MD  sitaGLIPtin (JANUVIA) 50 MG tablet Take 0.5 tablets (25 mg total) by mouth daily. 04/17/19  Yes McLean-Scocuzza, Nino Glow, MD  torsemide (DEMADEX) 20 MG tablet Take '30mg'$  every other day, alternate with 40 mg every other day 03/14/19  Yes Loel Dubonnet, NP  traZODone (DESYREL) 100 MG tablet Take 0.5-1 tablets (50-100 mg total) by mouth at bedtime as needed. 06/05/19  Yes McLean-Scocuzza, Nino Glow, MD  Carboxymethylcellulose Sodium (ARTIFICIAL TEARS OP) Apply 1 drop to eye 2 (two) times daily. Left eye    [provider]  clotrimazole (LOTRIMIN) 1 % cream APPLY TOPICALLY TO THE AFFECTED AREA ONCE A DAY AT BEDTIME FOR 7-14 DAYS 06/04/19   McLean-Scocuzza, Nino Glow, MD  dimethicone (PROSHIELD PLUS SKIN PROTECTANT) 1 % cream 1 application to the buttock 2 times daily to help protect skin 04/15/18   McLean-Scocuzza, Nino Glow, MD  Dorzolamide HCl-Timolol Mal (COSOPT OP) Place 1 drop into the right eye 2 (two) times daily.     [provider]  Lancets Glory Rosebush ULTRASOFT) lancets Bid E 11.9 Use as instructed 12/28/18   McLean-Scocuzza, Nino Glow, MD  mupirocin ointment (BACTROBAN) 2 % Apply 1 application topically 2 (two) times daily. To open sores as needed 07/26/18   McLean-Scocuzza, Nino Glow, MD  mupirocin ointment (BACTROBAN) 2 % Apply topically 2 (two) times daily. 11/16/18   McLean-Scocuzza, Nino Glow, MD  ONETOUCH DELICA LANCETS 25Z MISC USE TO TEST BLOOD SUGAR ONCE A DAY 11/30/17   [provider]  Polysacch Fe Complex-Vit  D3 (NOVAFERRUM 125) 125-100 MG-UNT/5ML LIQD Take 125 mg by mouth daily. 12/06/18   McLean-Scocuzza, Nino Glow, MD  Sodium Chloride Flush (NORMAL SALINE FLUSH) 0.9 % SOLN sodium chloride 0.9 % intravenous solution    [provider]  triamcinolone cream (KENALOG) 0.1 %  Apply 1 application topically 2 (two) times daily. Prn legs 08/16/18   McLean-Scocuzza, Nino Glow, MD  vitamin C (ASCORBIC ACID) 500 MG tablet Take 1 tablet (500 mg total) by mouth daily. 04/15/18   McLean-Scocuzza, Nino Glow, MD  Zinc Oxide 10 % OINT Apply 1 application topically daily as needed (rash). Applied to patient's bottom 04/15/18   McLean-Scocuzza, Nino Glow, MD     Review of Systems  Constitutional: Positive for fatigue (easily). Negative for appetite change.  HENT: Negative for congestion, postnasal drip and sore throat.   Eyes: Negative.   Respiratory: Positive for shortness of breath. Negative for cough and chest tightness.   Cardiovascular: Positive for leg swelling. Negative for chest pain and palpitations.  Gastrointestinal: Negative for abdominal distention and abdominal pain.  Endocrine: Negative.   Genitourinary: Negative.   Musculoskeletal: Positive for arthralgias (left knee). Negative for back pain and neck pain.  Skin: Negative.   Allergic/Immunologic: Negative.   Neurological: Negative for dizziness and light-headedness.  Hematological: Negative for adenopathy. Does not bruise/bleed easily.  Psychiatric/Behavioral: Positive for sleep disturbance (sleeps in recliner). Negative for dysphoric mood. The patient is not nervous/anxious.    Vitals:   06/10/19 1410  BP: (!) 157/78  Pulse: 80  Resp: 15  SpO2: 99%  Weight: 187 lb (84.8 kg)  Height: 5' (1.524 m)   Wt Readings from Last 3 Encounters:  06/10/19 187 lb (84.8 kg)  04/22/19 184 lb 9.6 oz (83.7 kg)  04/17/19 187 lb 6.4 oz (85 kg)   Lab Results  Component Value Date   CREATININE 1.40 (H) 04/22/2019   CREATININE 1.58 (H) 04/17/2019    CREATININE 1.56 (H) 04/17/2019     Physical Exam Vitals and nursing note reviewed.  Constitutional:      Appearance: Normal appearance.  HENT:     Head: Normocephalic and atraumatic.  Cardiovascular:     Rate and Rhythm: Normal rate and regular rhythm.  Pulmonary:     Effort: Pulmonary effort is normal. No respiratory distress.     Breath sounds: No wheezing, rhonchi or rales.  Abdominal:     General: There is no distension.     Palpations: Abdomen is soft.  Musculoskeletal:        General: No tenderness.     Cervical back: Normal range of motion and neck supple.     Right lower leg: Edema (1+ pitting ) present.     Left lower leg: Edema (1+ pitting ) present.  Skin:    General: Skin is warm and dry.  Neurological:     General: No focal deficit present.     Mental Status: She is alert and oriented to person, place, and time.  Psychiatric:        Mood and Affect: Mood normal.        Behavior: Behavior normal.    Assessment & Plan:  1: Chronic heart failure with preserved ejection fraction- - NYHA class III - euvolemic now - weighing daily; reminded to call for an overnight weight gain of >2 pounds or a weekly weight gain of >5 pounds - weight up 3 pounds since last visit 6 weeks ago - not adding salt to her food except a "sprinkle" when eating seafood.  - currently taking 55m torsemide QOD alternating with 443mQOD which seems to be keeping symptoms & BP stable right now - saw cardiology (WGilford Rile1/13/21 & returns July 2021 - BNP 04/17/19 was 77.0 - has received flu vaccine this season. - discussed palliative  care with patient and daughter. Daughter says that they've been involved with palliative care in the past and do recognize that more assistance is needed. They are not ready to resume palliative care but we will continue to discuss  2: HTN- - BP mildly elevated today; does fluctuate during the day; advised daughter to check it just daily (instead of 3 times/ day)  but vary the times of day that she checks it - saw PCP (McLean-Scocuzza) 03/13/19  - BMP from 04/29/19 reviewed and showed sodium 139, potassium 4.4, creatinine 1.39 and GFR 39  3: DM- - A1c 03/13/19 was 8.3% - nonfasting glucose at home today was 130 - saw nephrology (K[place]) 05/22/19 & returns 09/23/19  4: Lymphedema- - stage 2 - saw vascular 08/23/2018 - elevating her legs "at times"; encouraged her to elevate her legs when sitting for long periods of time - has been wearing support socks daily & currently has both lower legs wrapped - limited in her ability to exercise due to her shortness of breath - had been wearing compression boots but hasn't worn them much recently   Medication bottles were reviewed.    Return in 1 month or sooner for any questions/problems before then

## 2019-06-10 ENCOUNTER — Ambulatory Visit: Payer: PPO | Attending: Family | Admitting: Family

## 2019-06-10 ENCOUNTER — Other Ambulatory Visit: Payer: Self-pay

## 2019-06-10 ENCOUNTER — Encounter: Payer: Self-pay | Admitting: Family

## 2019-06-10 VITALS — BP 157/78 | HR 80 | Resp 15 | Ht 60.0 in | Wt 187.0 lb

## 2019-06-10 DIAGNOSIS — Z888 Allergy status to other drugs, medicaments and biological substances status: Secondary | ICD-10-CM | POA: Insufficient documentation

## 2019-06-10 DIAGNOSIS — Z953 Presence of xenogenic heart valve: Secondary | ICD-10-CM | POA: Insufficient documentation

## 2019-06-10 DIAGNOSIS — Z7982 Long term (current) use of aspirin: Secondary | ICD-10-CM | POA: Insufficient documentation

## 2019-06-10 DIAGNOSIS — Z794 Long term (current) use of insulin: Secondary | ICD-10-CM | POA: Insufficient documentation

## 2019-06-10 DIAGNOSIS — N184 Chronic kidney disease, stage 4 (severe): Secondary | ICD-10-CM

## 2019-06-10 DIAGNOSIS — I509 Heart failure, unspecified: Secondary | ICD-10-CM | POA: Diagnosis present

## 2019-06-10 DIAGNOSIS — Z8379 Family history of other diseases of the digestive system: Secondary | ICD-10-CM | POA: Diagnosis not present

## 2019-06-10 DIAGNOSIS — K219 Gastro-esophageal reflux disease without esophagitis: Secondary | ICD-10-CM | POA: Diagnosis not present

## 2019-06-10 DIAGNOSIS — E119 Type 2 diabetes mellitus without complications: Secondary | ICD-10-CM | POA: Diagnosis not present

## 2019-06-10 DIAGNOSIS — I110 Hypertensive heart disease with heart failure: Secondary | ICD-10-CM | POA: Insufficient documentation

## 2019-06-10 DIAGNOSIS — M109 Gout, unspecified: Secondary | ICD-10-CM | POA: Insufficient documentation

## 2019-06-10 DIAGNOSIS — Z8673 Personal history of transient ischemic attack (TIA), and cerebral infarction without residual deficits: Secondary | ICD-10-CM | POA: Diagnosis not present

## 2019-06-10 DIAGNOSIS — I890 Lymphedema, not elsewhere classified: Secondary | ICD-10-CM | POA: Diagnosis not present

## 2019-06-10 DIAGNOSIS — E785 Hyperlipidemia, unspecified: Secondary | ICD-10-CM | POA: Insufficient documentation

## 2019-06-10 DIAGNOSIS — Z833 Family history of diabetes mellitus: Secondary | ICD-10-CM | POA: Insufficient documentation

## 2019-06-10 DIAGNOSIS — F329 Major depressive disorder, single episode, unspecified: Secondary | ICD-10-CM | POA: Diagnosis not present

## 2019-06-10 DIAGNOSIS — Z79899 Other long term (current) drug therapy: Secondary | ICD-10-CM | POA: Insufficient documentation

## 2019-06-10 DIAGNOSIS — I10 Essential (primary) hypertension: Secondary | ICD-10-CM

## 2019-06-10 DIAGNOSIS — I5032 Chronic diastolic (congestive) heart failure: Secondary | ICD-10-CM

## 2019-06-10 DIAGNOSIS — E1122 Type 2 diabetes mellitus with diabetic chronic kidney disease: Secondary | ICD-10-CM

## 2019-06-10 NOTE — Patient Instructions (Signed)
Continue weighing daily and call for an overnight weight gain of > 2 pounds or a weekly weight gain of >5 pounds. 

## 2019-06-11 ENCOUNTER — Encounter: Payer: Self-pay | Admitting: Family

## 2019-06-13 ENCOUNTER — Encounter: Payer: Self-pay | Admitting: Internal Medicine

## 2019-06-13 ENCOUNTER — Telehealth (INDEPENDENT_AMBULATORY_CARE_PROVIDER_SITE_OTHER): Payer: PPO | Admitting: Internal Medicine

## 2019-06-13 VITALS — BP 125/82 | HR 75 | Ht 60.0 in | Wt 186.0 lb

## 2019-06-13 DIAGNOSIS — B373 Candidiasis of vulva and vagina: Secondary | ICD-10-CM | POA: Diagnosis not present

## 2019-06-13 DIAGNOSIS — N760 Acute vaginitis: Secondary | ICD-10-CM | POA: Diagnosis not present

## 2019-06-13 DIAGNOSIS — M1712 Unilateral primary osteoarthritis, left knee: Secondary | ICD-10-CM | POA: Diagnosis not present

## 2019-06-13 DIAGNOSIS — E119 Type 2 diabetes mellitus without complications: Secondary | ICD-10-CM

## 2019-06-13 DIAGNOSIS — B9689 Other specified bacterial agents as the cause of diseases classified elsewhere: Secondary | ICD-10-CM

## 2019-06-13 DIAGNOSIS — G4700 Insomnia, unspecified: Secondary | ICD-10-CM | POA: Diagnosis not present

## 2019-06-13 DIAGNOSIS — E1122 Type 2 diabetes mellitus with diabetic chronic kidney disease: Secondary | ICD-10-CM

## 2019-06-13 DIAGNOSIS — E1165 Type 2 diabetes mellitus with hyperglycemia: Secondary | ICD-10-CM

## 2019-06-13 DIAGNOSIS — M79672 Pain in left foot: Secondary | ICD-10-CM

## 2019-06-13 DIAGNOSIS — R5381 Other malaise: Secondary | ICD-10-CM

## 2019-06-13 DIAGNOSIS — I10 Essential (primary) hypertension: Secondary | ICD-10-CM

## 2019-06-13 DIAGNOSIS — N184 Chronic kidney disease, stage 4 (severe): Secondary | ICD-10-CM

## 2019-06-13 DIAGNOSIS — F419 Anxiety disorder, unspecified: Secondary | ICD-10-CM

## 2019-06-13 DIAGNOSIS — B3731 Acute candidiasis of vulva and vagina: Secondary | ICD-10-CM

## 2019-06-13 DIAGNOSIS — R110 Nausea: Secondary | ICD-10-CM | POA: Diagnosis not present

## 2019-06-13 MED ORDER — METRONIDAZOLE 0.75 % VA GEL: 1.0000 | Freq: Two times a day (BID) | VAGINAL | 2 refills | Status: AC

## 2019-06-13 MED ORDER — ONDANSETRON HCL 4 MG PO TABS: 4.0000 mg | ORAL_TABLET | Freq: Two times a day (BID) | ORAL | 5 refills | Status: DC | PRN

## 2019-06-13 MED ORDER — SITAGLIPTIN PHOSPHATE 25 MG PO TABS: 25.0000 mg | ORAL_TABLET | Freq: Every day | ORAL | 3 refills | Status: DC

## 2019-06-13 MED ORDER — FLUCONAZOLE 150 MG PO TABS: 150.0000 mg | ORAL_TABLET | Freq: Once | ORAL | 2 refills | Status: AC

## 2019-06-13 MED ORDER — LORAZEPAM 0.5 MG PO TABS: 0.2500 mg | ORAL_TABLET | Freq: Every evening | ORAL | 5 refills | Status: DC | PRN

## 2019-06-13 MED ORDER — OZEMPIC (0.25 OR 0.5 MG/DOSE) 2 MG/1.5ML ~~LOC~~ SOPN: 0.2500 mg | PEN_INJECTOR | SUBCUTANEOUS | 11 refills | Status: AC

## 2019-06-13 MED ORDER — GLIPIZIDE 5 MG PO TABS: 2.5000 mg | ORAL_TABLET | Freq: Every day | ORAL | Status: DC

## 2019-06-13 NOTE — Progress Notes (Signed)
Patient caregiver assisting with video visit today, Tammy. Number to call and for video will be [phone].

## 2019-06-13 NOTE — Progress Notes (Addendum)
Virtual Visit via Video Note  I connected with [name]  on 06/13/19 at  9:45 AM EDT by a video enabled telemedicine application and verified that I am speaking with the correct person using two identifiers.  Location patient: home Location provider:work or home office Persons participating in the virtual visit: patient, provider  I discussed the limitations of evaluation and management by telemedicine and the availability of in person appointments. The patient expressed understanding and agreed to proceed.   HPI: F/u with caretaker Tammy  1. Dm 2 on ozempic 0.5 but causing nausea and lack of appetite x 1 week, glip 2.5 mg qd, januvia 25 mg qd and SSI cbgs range 90s-130s to 268 yesterday and doing SSI 2-6 units prn  Reviewed cbg log 04/29/19 to 05/29/19  am cbg ~6-7 am: 88 to 167 Lunch cbg ~11-12pm:  92-284 Pm ccbg 2:30; ~5-6 pm: 93-238 Pm cbg ~8 PM: 106-270   Has hyperglycemia due to diet noncompliance and checking 4x per day  2. Insomnia on trazadone 50-100 mg qhs and napping during the day ed sleep hygiene and daughter wants prn med  3. Recurrent bv/yeast daughter want prn meds to treat 4. Left knee mild to mod OA wants Rx knee brace  5. Pt c/o mild to moderate left heel pain at times with walking and this is new nothing tried but otc meds I.e tylenol which helps some   ROS: See pertinent positives and negatives per HPI.  Past Medical History:  Diagnosis Date  . Anemia   . Carotid arterial disease ([place])    a. 08/2014 U/S: Bilateral < 50% stenosis. Patent vertebrals w/ antegrade flow.   . Cervical spine arthritis   . Chronic diastolic CHF (congestive heart failure) ([place])    a. echo 2014: EF 55-60%, DD;  b. 08/2014 Echo: EF 55-60%, no RWMA, GR1DD; c. 02/2016 Echo: EF 65-70%, Gr1 DD.  Marland Kitchen Cognitive impairment   . Depression   . Diabetes mellitus without complication ([place])   . Essential hypertension   . GERD (gastroesophageal reflux disease)   . Glaucoma   . Gout   . History of  renal impairment   . Hyperlipidemia   . Hypotension    a. Related to Norvasc - 11/2014 ED visit.  . Multinodular goiter    a. Noted incidentally on CT 07/2012 and carotid U/S 08/2014;  b. Nl TSH 11/2014.  Marland Kitchen Paroxysmal SVT (supraventricular tachycardia) ([facility])    a. on Toprol   . Prolapsed uterus   . Severe aortic stenosis    a. 11/15/16: s/p TAVR Edwards Sapien 3 THV (size 23 mm, model # 9600TFX, serial # [id number])  . Stroke [facility])    a. CT head 07/2014 showed small thalamic infarct  . Vitamin deficiency     Past Surgical History:  Procedure Laterality Date  . EYE SURGERY     unsure of what procedure, did know that laser was used  . IR RADIOLOGY PERIPHERAL GUIDED IV START  11/03/2016  . IR US GUIDE VASC ACCESS RIGHT  11/03/2016  . RIGHT/LEFT HEART CATH AND CORONARY ANGIOGRAPHY N/A 10/26/2016   Procedure: RIGHT/LEFT HEART CATH AND CORONARY ANGIOGRAPHY;  Surgeon: [name], MD;  Location: [facility] [facility];  Service: Cardiovascular;  Laterality: N/A;  . TEE WITHOUT CARDIOVERSION N/A 03/31/2016   Procedure: TRANSESOPHAGEAL ECHOCARDIOGRAM (TEE);  Surgeon: Minna Merritts, MD;  Location: [facility];  Service: Cardiovascular;  Laterality: N/A;  . TEE WITHOUT CARDIOVERSION N/A 11/15/2016   Procedure: TRANSESOPHAGEAL ECHOCARDIOGRAM (TEE);  Surgeon: [name], MD;  Location: [place];  Service: Open Heart Surgery;  Laterality: N/A;  . TRANSCATHETER AORTIC VALVE REPLACEMENT, TRANSFEMORAL N/A 11/15/2016   Procedure: TRANSCATHETER AORTIC VALVE REPLACEMENT, TRANSFEMORAL;  Surgeon: [name], MD;  Location: [place];  Service: Open Heart Surgery;  Laterality: N/A;  . TUBAL LIGATION      Family History  Problem Relation Age of Onset  . Glaucoma Mother   . Diabetes Mother   . Peptic Ulcer Father   . Colitis Father     SOCIAL HX: lives at home   Current Outpatient Medications:  .  ACCU-CHEK AVIVA PLUS test strip, USE 1 STRIP TO TEST BLOOD SUGARS 3 TIMES A DAY, Disp: 300 each, Rfl: 4 .   acetaminophen (TYLENOL) 500 MG tablet, Take 1 tablet (500 mg total) by mouth every 6 (six) hours as needed for mild pain or headache., Disp: 360 tablet, Rfl: 3 .  allopurinol (ZYLOPRIM) 100 MG tablet, Take 1 tablet (100 mg total) by mouth daily., Disp: 90 tablet, Rfl: 3 .  aspirin 81 MG chewable tablet, Chew 1 tablet (81 mg total) by mouth daily., Disp: 90 tablet, Rfl: 3 .  blood glucose meter kit and supplies, Dispense based on patient and insurance preference. Use 1x daily as directed. (FOR ICD-10 E10.9, E11.9)., Disp: 1 each, Rfl: 0 .  brimonidine (ALPHAGAN P) 0.1 % SOLN, Place 1 drop into the right eye 2 (two) times daily., Disp: , Rfl:  .  Carboxymethylcellulose Sodium (ARTIFICIAL TEARS OP), Apply 1 drop to eye 2 (two) times daily. Left eye, Disp: , Rfl:  .  cholecalciferol (VITAMIN D) 25 MCG (1000 UT) tablet, Take 1 tablet (1,000 Units total) by mouth daily., Disp: 90 tablet, Rfl: 3 .  clotrimazole (LOTRIMIN) 1 % cream, APPLY TOPICALLY TO THE AFFECTED AREA ONCE A DAY AT BEDTIME FOR 7-14 DAYS, Disp: 60 g, Rfl: 11 .  dimethicone (PROSHIELD PLUS SKIN PROTECTANT) 1 % cream, 1 application to the buttock 2 times daily to help protect skin, Disp: 114 g, Rfl: 11 .  Dorzolamide HCl-Timolol Mal (COSOPT OP), Place 1 drop into the right eye 2 (two) times daily. , Disp: , Rfl:  .  fluticasone (FLONASE) 50 MCG/ACT nasal spray, Place 1 spray into both nostrils daily. Prn max 2 sprays total each nose, Disp: 16 g, Rfl: 11 .  gabapentin (NEURONTIN) 300 MG capsule, Take 1 capsule (300 mg total) by mouth 2 (two) times daily., Disp: 180 capsule, Rfl: 3 .  glipiZIDE (GLUCOTROL) 5 MG tablet, Take 0.5 tablets (2.5 mg total) by mouth daily before breakfast., Disp:  , Rfl:  .  glucose blood test strip, Accu-Chek Aviva Plus test strips, Disp: 300 each, Rfl: 4 .  glucose blood test strip, Bid Use as instructed E11.9, Disp: 200 each, Rfl: 12 .  insulin aspart (NOVOLOG FLEXPEN) 100 UNIT/ML FlexPen, TID before meals 15-30  minutes based on sugar reading before meals If sugar: 70-130 0 units, 131-180 2 units, 181-240 4 units, 241-300 6 units, 301-350 8 units, 351-400 10 units, >400 12 units and call doctor, Disp: 15 mL, Rfl: 11 .  Insulin Pen Needle (PEN NEEDLES) 30G X 8 MM MISC, 1 Device by Does not apply route once a week., Disp: 30 each, Rfl: 11 .  Insulin Pen Needle (PEN NEEDLES) 30G X 8 MM MISC, 1 Device by Does not apply route 3 (three) times daily before meals., Disp: 270 each, Rfl: 3 .  Lancet Device MISC, 1 Device by Does not apply route  as directed., Disp: 1 each, Rfl: 0 .  Lancets (ONETOUCH ULTRASOFT) lancets, Bid E 11.9 Use as instructed, Disp: 200 each, Rfl: 12 .  latanoprost (XALATAN) 0.005 % ophthalmic solution, Place 1 drop into the right eye at bedtime. , Disp: , Rfl:  .  LORazepam (ATIVAN) 0.5 MG tablet, Take 0.5-1 tablets (0.25-0.5 mg total) by mouth at bedtime as needed for anxiety., Disp: 30 tablet, Rfl: 5 .  lovastatin (MEVACOR) 20 MG tablet, Take 1 tablet (20 mg total) by mouth at bedtime. Note reduced dose due to age and chronic medical issues d/c 40 mg dose, Disp: 90 tablet, Rfl: 3 .  metoprolol succinate (TOPROL-XL) 25 MG 24 hr tablet, Take 1 tablet (25 mg total) by mouth daily., Disp: 90 tablet, Rfl: 3 .  Multiple Vitamin (MULTI-VITAMIN PO), Take 1 tablet by mouth daily., Disp: , Rfl:  .  mupirocin ointment (BACTROBAN) 2 %, Apply 1 application topically 2 (two) times daily. To open sores as needed, Disp: 30 g, Rfl: 2 .  mupirocin ointment (BACTROBAN) 2 %, Apply topically 2 (two) times daily., Disp: 30 g, Rfl: 2 .  ONETOUCH DELICA LANCETS 03J MISC, USE TO TEST BLOOD SUGAR ONCE A DAY, Disp: , Rfl: 0 .  pantoprazole (PROTONIX) 40 MG tablet, Take 1 tablet (40 mg total) by mouth daily. 30 minutes before breakfast OR dinner, Disp: 90 tablet, Rfl: 3 .  pilocarpine (PILOCAR) 4 % ophthalmic solution, Place 1 drop into the right eye 4 (four) times daily. , Disp: , Rfl:  .  polyethylene glycol (MIRALAX)  packet, Take 17 g by mouth daily. prn, Disp: 30 each, Rfl: 11 .  Polysacch Fe Complex-Vit D3 (NOVAFERRUM 125) 125-100 MG-UNT/5ML LIQD, Take 125 mg by mouth daily., Disp: 360 mL, Rfl: 11 .  Potassium Chloride ER 20 MEQ TBCR, TAKE 1 TABLET BY MOUTH EVERY DAY WITH FLUID PILL, Disp: 90 tablet, Rfl: 1 .  Semaglutide,0.25 or 0.5MG/DOS, (OZEMPIC, 0.25 OR 0.5 MG/DOSE,) 2 MG/1.5ML SOPN, Inject 0.25 mg into the skin once a week., Disp: 5 pen, Rfl: 11 .  sertraline (ZOLOFT) 50 MG tablet, Take 1 tablet (50 mg total) by mouth daily., Disp: 90 tablet, Rfl: 3 .  sitaGLIPtin (JANUVIA) 25 MG tablet, Take 1 tablet (25 mg total) by mouth daily. D/c 50 mg, Disp: 90 tablet, Rfl: 3 .  Sodium Chloride Flush (NORMAL SALINE FLUSH) 0.9 % SOLN, sodium chloride 0.9 % intravenous solution, Disp: , Rfl:  .  torsemide (DEMADEX) 20 MG tablet, Take 5m every other day, alternate with 40 mg every other day, Disp: , Rfl:  .  traZODone (DESYREL) 100 MG tablet, Take 0.5-1 tablets (50-100 mg total) by mouth at bedtime as needed., Disp: 90 tablet, Rfl: 3 .  triamcinolone cream (KENALOG) 0.1 %, Apply 1 application topically 2 (two) times daily. Prn legs, Disp: 80 g, Rfl: 0 .  vitamin C (ASCORBIC ACID) 500 MG tablet, Take 1 tablet (500 mg total) by mouth daily., Disp: 90 tablet, Rfl: 3 .  Zinc Oxide 10 % OINT, Apply 1 application topically daily as needed (rash). Applied to patient's bottom, Disp: 226.8 g, Rfl: 11 .  fluconazole (DIFLUCAN) 150 MG tablet, Take 1 tablet (150 mg total) by mouth once for 1 dose., Disp: 1 tablet, Rfl: 2 .  metroNIDAZOLE (METROGEL) 0.75 % vaginal gel, Place 1 Applicatorful vaginally 2 (two) times daily. Prn BV, Disp: 70 g, Rfl: 2 .  ondansetron (ZOFRAN) 4 MG tablet, Take 1 tablet (4 mg total) by mouth 2 (two) times daily  as needed for nausea or vomiting., Disp: 30 tablet, Rfl: 5  EXAM:  VITALS per patient if applicable:  GENERAL: alert, oriented, appears well and in no acute distress  HEENT: atraumatic,  conjunttiva clear, no obvious abnormalities on inspection of external nose and ears  NECK: normal movements of the head and neck  LUNGS: on inspection no signs of respiratory distress, breathing rate appears normal, no obvious gross SOB, gasping or wheezing  CV: no obvious cyanosis  MS: moves all visible extremities without noticeable abnormality  PSYCH/NEURO: pleasant and cooperative, no obvious depression or anxiety, speech and thought processing grossly intact  ASSESSMENT AND PLAN:  Discussed the following assessment and plan:  Osteoarthritis of left knee, unspecified osteoarthritis type Prn Tylenol  Rx left knee brace  Type 2 diabetes mellitus with stage 4 chronic kidney disease, with long-term current use of insulin ([place]) - Plan: Semaglutide,0.25 reduced from 0.5 1x per week prn zofran Glipizide 2.5 mg qd januvia 25 mg qd  SSI prn based on cbgs ssi cbg checking 4x per day see HPI Has hyperglycemia due to diet noncompliance which is need for cgm freestyle libre Pt compliant with meds   Insomnia, unspecified type - Plan: LORazepam (ATIVAN) 0.5 MG tablet  Nausea - Plan: ondansetron (ZOFRAN) 4 MG tablet  Yeast vaginitis - Plan: fluconazole (DIFLUCAN) 150 MG tablet  Bacterial vaginosis - Plan: metroNIDAZOLE (METROGEL) 0.75 % vaginal gel  Left foot pain - Plan: DG Foot Complete Left  HM Flu shotutd  utd pna 23 consder Tdap in future  Rx shingrixin the past covid 2/2 had  prevnar utd mmr immune  Pap 05/07/12 negative  Mammogram 04/27/16 negative  DEXA 12/18/06 osteopeniavitamin D 80.01 01/04/18 Colonoscopy 03/04/97 hyperplastic polyps no f/u   -we discussed possible serious and likely etiologies, options for evaluation and workup, limitations of telemedicine visit vs in person visit, treatment, treatment risks and precautions. Pt prefers to treat via telemedicine empirically rather then risking or undertaking an in person visit at this moment. Patient agrees to  seek prompt in person care if worsening, new symptoms arise, or if is not improving with treatment.   I discussed the assessment and treatment plan with the patient. The patient was provided an opportunity to ask questions and all were answered. The patient agreed with the plan and demonstrated an understanding of the instructions.   The patient was advised to call back or seek an in-person evaluation if the symptoms worsen or if the condition fails to improve as anticipated.  Time spent 25 minutes Delorise Jackson, MD

## 2019-06-14 ENCOUNTER — Telehealth: Payer: Self-pay | Admitting: Internal Medicine

## 2019-06-14 NOTE — Telephone Encounter (Signed)
Left message to return call. Knee brace order has been placed up front for pick up.

## 2019-06-17 NOTE — Telephone Encounter (Signed)
Inform daughter  Its not as easy as just ordering this  Please have her drop off copy of her sugar log for the last 3 months to get freestyle libre or another device approved IF her insurance will approve it   She also needs to confirm  her insurance will cover this before I order it or which device freestyle libre, dexacom G6  A lot of insurances will not cover this device and she needs to check 1st before dropping off 3 month sugar log    TMS

## 2019-06-17 NOTE — Telephone Encounter (Signed)
Pt's daughter informed.   She states that with the patient checking her sugar more than 4 times daily and now being on a sliding scale insulin. They would like for the patient to be placed on a CGM as Medicare should now cover this. Please advise

## 2019-06-19 NOTE — Telephone Encounter (Signed)
Spoke with patient's daughter and she will call the insurance and then bring in the log. She will then inform us on what is covered.

## 2019-06-21 NOTE — Telephone Encounter (Signed)
Pt's daughter called and would like a call back.

## 2019-06-21 NOTE — Telephone Encounter (Signed)
Spoke with pt's daughter and they will approve: One Touch, Freestyle, and Precision meters. The Freestyle Elenor Legato is a CGM.   She states that she will send the patient's sugar log via mychart. Informed her that the pictures will need to be clear and display the dates, times, and sugar levels. Patient's daughter verbalized understanding

## 2019-06-21 NOTE — Telephone Encounter (Signed)
Pt daughter called back returning your call

## 2019-06-25 ENCOUNTER — Other Ambulatory Visit: Payer: Self-pay | Admitting: Cardiovascular Disease

## 2019-06-25 DIAGNOSIS — H401130 Primary open-angle glaucoma, bilateral, stage unspecified: Secondary | ICD-10-CM | POA: Diagnosis not present

## 2019-06-25 LAB — HM DIABETES EYE EXAM

## 2019-06-26 ENCOUNTER — Encounter: Payer: Self-pay | Admitting: Internal Medicine

## 2019-07-01 ENCOUNTER — Telehealth: Payer: Self-pay | Admitting: Internal Medicine

## 2019-07-01 MED ORDER — FREESTYLE LIBRE 14 DAY READER DEVI: 1.0000 | 11 refills | Status: AC

## 2019-07-01 MED ORDER — FREESTYLE LIBRE 14 DAY SENSOR MISC: 1.0000 | 11 refills | Status: AC

## 2019-07-01 NOTE — Telephone Encounter (Signed)
Pt daughter called and wanted to talk about the dose of OZEMPIC that her mother  is taking

## 2019-07-01 NOTE — Telephone Encounter (Signed)
I would not increase ozempic due to her kidneys  The safest thing to do would be do stop glipizde and start insulin daily low dose and use fast acting insulin if sugar still elevated, if she is going to eat what she wants  Does [name] want to do this?

## 2019-07-01 NOTE — Addendum Note (Signed)
Addended by: Orland Mustard on: 07/01/2019 05:23 PM   Modules accepted: Orders

## 2019-07-01 NOTE — Telephone Encounter (Signed)
State patient's sugars have been running high as they are starting to slowly give her more of what she wants to keep her happy. Also states that when Mother's day comes a lot of people will be bringing her sweets.   Patient has been delirious and when her sugar was checked it was 265. Also states this could be due to grief as a lot of the patient's friends are passing. This is why they are allowing her to have some of the foods she likes, to help her mood.  Wanting to know if they can go back up on her Ozempic from 0.25 back to 0.5, even at least for just this weekend. Please advise

## 2019-07-01 NOTE — Telephone Encounter (Signed)
Sent freestyle libre to see if will cover if not onetouch, freestyle, precision meters will cover   [place]

## 2019-07-02 NOTE — Progress Notes (Signed)
Patient ID: [name], female    DOB: 08-Dec-1924, 86 y.o.   MRN: [id number]  HPI  Ms [name] is a 86 y/o female with a history of Dm, hyperlipidemia, HTN, stroke, thyroid disease, GERD, anemia, SVT, severe aortic stenosis and chronic heart failure.   Echo report from 03/09/2018 reviewed and showed an EF of 50-55% along with mildly elevated PA pressure of 55m Hg.   Catheterization done 10/26/16 reviewed and showed: Patent coronary arteries with the exception of severe stenosis in the right PDA. There is mild nonobstructive stenosis of the LAD and RCA and a widely patent left main and left circumflex 2. Moderate to severe pulmonary hypertension with pulmonary artery pressure of 74/21 with a mean of 45 and a pulmonary capillary wedge pressure of 23 with a 32 mmHg T-wave 3. Mean transaortic valve gradient of 17 mmHg and calculated aortic valve area of 1.0 cm, suspect contamination of the pressure gradient with use of an AL-1 catheter and difficult measurement because of frequent ectopy  Has not been admitted or been in the ED in the last 6 months.   She presents today for a follow-up visit with a chief complaint of moderate fatigue upon minimal exertion. She describes this as chronic in nature having been present for several years. She has associated shortness of breath, pedal edema, difficulty sleeping and chronic pain along with this. She denies any dizziness, abdominal distention, palpitations, chest pain, cough or weight gain.   Currently taking torsemide 384mQOD/ 4018mOD and this appears to be keeping everything stable right now. Daughter has decided to let patient "eat mostly what she wants" because patient was feeling depressed due to not being able to eat certain foods and patient has had numerous friends recently pass away. Daughter is waiting on return call from PCP's office regarding ozempic usage.   Past Medical History:  Diagnosis Date  . Anemia   . Carotid arterial  disease ([facility]  a. 08/2014 U/S: Bilateral < 50% stenosis. Patent vertebrals w/ antegrade flow.   . Cervical spine arthritis   . Chronic diastolic CHF (congestive heart failure) ([facility]  a. echo 2014: EF 55-60%, DD;  b. 08/2014 Echo: EF 55-60%, no RWMA, GR1DD; c. 02/2016 Echo: EF 65-70%, Gr1 DD.  . CMarland Kitchengnitive impairment   . Depression   . Diabetes mellitus without complication ([facility] . Essential hypertension   . GERD (gastroesophageal reflux disease)   . Glaucoma   . Gout   . History of renal impairment   . Hyperlipidemia   . Hypotension    a. Related to Norvasc - 11/2014 ED visit.  . Multinodular goiter    a. Noted incidentally on CT 07/2012 and carotid U/S 08/2014;  b. Nl TSH 11/2014.  . PMarland Kitchenroxysmal SVT (supraventricular tachycardia) (HCC)    a. on Toprol   . Prolapsed uterus   . Severe aortic stenosis    a. 11/15/16: s/p TAVR Edwards Sapien 3 THV (size 23 mm, model # 9600TFX, serial # [id number]. [facility]  a. CT head 07/2014 showed small thalamic infarct  . Vitamin deficiency    Past Surgical History:  Procedure Laterality Date  . EYE SURGERY     unsure of what procedure, did know that laser was used  . IR RADIOLOGY PERIPHERAL GUIDED IV START  11/03/2016  . IR US [place] VASC ACCESS RIGHT  11/03/2016  . RIGHT/LEFT HEART CATH AND CORONARY ANGIOGRAPHY N/A 10/26/2016   Procedure:  RIGHT/LEFT HEART CATH AND CORONARY ANGIOGRAPHY;  Surgeon: [name], MD;  Location: [place] [facility];  Service: Cardiovascular;  Laterality: N/A;  . TEE WITHOUT CARDIOVERSION N/A 03/31/2016   Procedure: TRANSESOPHAGEAL ECHOCARDIOGRAM (TEE);  Surgeon: Minna Merritts, MD;  Location: [facility];  Service: Cardiovascular;  Laterality: N/A;  . TEE WITHOUT CARDIOVERSION N/A 11/15/2016   Procedure: TRANSESOPHAGEAL ECHOCARDIOGRAM (TEE);  Surgeon: [name], MD;  Location: [place];  Service: Open Heart Surgery;  Laterality: N/A;  . TRANSCATHETER AORTIC VALVE REPLACEMENT, TRANSFEMORAL N/A 11/15/2016   Procedure:  TRANSCATHETER AORTIC VALVE REPLACEMENT, TRANSFEMORAL;  Surgeon: [name], MD;  Location: [place];  Service: Open Heart Surgery;  Laterality: N/A;  . TUBAL LIGATION     Family History  Problem Relation Age of Onset  . Glaucoma Mother   . Diabetes Mother   . Peptic Ulcer Father   . Colitis Father    Social History   Tobacco Use  . Smoking status: Never Smoker  . Smokeless tobacco: Never Used  Substance Use Topics  . Alcohol use: No   Allergies  Allergen Reactions  . Metformin Hcl Other (See Comments)    GI upset  . Metformin Other (See Comments), Diarrhea and Nausea And Vomiting    Other reaction(s): Other (See Comments), Unknown  . Metformin And Related Diarrhea   Prior to Admission medications   Medication Sig Start Date End Date Taking? Authorizing Provider  ACCU-CHEK AVIVA PLUS test strip USE 1 STRIP TO TEST BLOOD SUGARS 3 TIMES A DAY 04/15/18  Yes McLean-Scocuzza, Nino Glow, MD  acetaminophen (TYLENOL) 500 MG tablet Take 1 tablet (500 mg total) by mouth every 6 (six) hours as needed for mild pain or headache. 04/15/18  Yes McLean-Scocuzza, Nino Glow, MD  allopurinol (ZYLOPRIM) 100 MG tablet Take 1 tablet (100 mg total) by mouth daily. 06/04/19  Yes McLean-Scocuzza, Nino Glow, MD  aspirin 81 MG chewable tablet Chew 1 tablet (81 mg total) by mouth daily. 04/15/18  Yes McLean-Scocuzza, Nino Glow, MD  blood glucose meter kit and supplies Dispense based on patient and insurance preference. Use 1x daily as directed. (FOR ICD-10 E10.9, E11.9). 11/30/17  Yes McLean-Scocuzza, Nino Glow, MD  brimonidine (ALPHAGAN P) 0.1 % SOLN Place 1 drop into the right eye 2 (two) times daily.   Yes [provider]  Carboxymethylcellulose Sodium (ARTIFICIAL TEARS OP) Apply 1 drop to eye 2 (two) times daily. Left eye   Yes [provider]  cholecalciferol (VITAMIN D) 25 MCG (1000 UT) tablet Take 1 tablet (1,000 Units total) by mouth daily. 04/15/18  Yes McLean-Scocuzza, Nino Glow, MD  clotrimazole  (LOTRIMIN) 1 % cream APPLY TOPICALLY TO THE AFFECTED AREA ONCE A DAY AT BEDTIME FOR 7-14 DAYS 06/04/19  Yes McLean-Scocuzza, Nino Glow, MD  dimethicone (PROSHIELD PLUS SKIN PROTECTANT) 1 % cream 1 application to the buttock 2 times daily to help protect skin 04/15/18  Yes McLean-Scocuzza, Nino Glow, MD  Dorzolamide HCl-Timolol Mal (COSOPT OP) Place 1 drop into the right eye 2 (two) times daily.    Yes [provider]  fluticasone (FLONASE) 50 MCG/ACT nasal spray Place 1 spray into both nostrils daily. Prn max 2 sprays total each nose 04/15/18  Yes McLean-Scocuzza, Nino Glow, MD  gabapentin (NEURONTIN) 300 MG capsule Take 1 capsule (300 mg total) by mouth 2 (two) times daily. 05/10/19  Yes McLean-Scocuzza, Nino Glow, MD  glipiZIDE (GLUCOTROL) 5 MG tablet Take 0.5 tablets (2.5 mg total) by mouth daily before breakfast. 06/13/19  Yes McLean-Scocuzza,  Nino Glow, MD  glucose blood test strip Accu-Chek Aviva Plus test strips 04/15/18  Yes McLean-Scocuzza, Nino Glow, MD  glucose blood test strip Bid Use as instructed E11.9 12/28/18  Yes McLean-Scocuzza, Nino Glow, MD  insulin aspart (NOVOLOG FLEXPEN) 100 UNIT/ML FlexPen TID before meals 15-30 minutes based on sugar reading before meals If sugar: 70-130 0 units, 131-180 2 units, 181-240 4 units, 241-300 6 units, 301-350 8 units, 351-400 10 units, >400 12 units and call doctor 04/29/19  Yes McLean-Scocuzza, Nino Glow, MD  Insulin Pen Needle (PEN NEEDLES) 30G X 8 MM MISC 1 Device by Does not apply route once a week. 03/13/19  Yes McLean-Scocuzza, Nino Glow, MD  Insulin Pen Needle (PEN NEEDLES) 30G X 8 MM MISC 1 Device by Does not apply route 3 (three) times daily before meals. 04/21/19  Yes McLean-Scocuzza, Nino Glow, MD  Lancet Device MISC 1 Device by Does not apply route as directed. 05/13/19  Yes McLean-Scocuzza, Nino Glow, MD  Lancets [facility] ULTRASOFT) lancets Bid E 11.9 Use as instructed 12/28/18  Yes McLean-Scocuzza, Nino Glow, MD  latanoprost (XALATAN) 0.005 % ophthalmic solution  Place 1 drop into the right eye at bedtime.    Yes [provider]  LORazepam (ATIVAN) 0.5 MG tablet Take 0.5-1 tablets (0.25-0.5 mg total) by mouth at bedtime as needed for anxiety. 06/13/19  Yes McLean-Scocuzza, Nino Glow, MD  lovastatin (MEVACOR) 20 MG tablet Take 1 tablet (20 mg total) by mouth at bedtime. Note reduced dose due to age and chronic medical issues d/c 40 mg dose 05/06/19  Yes McLean-Scocuzza, Nino Glow, MD  metoprolol succinate (TOPROL-XL) 25 MG 24 hr tablet Take 1 tablet (25 mg total) by mouth daily. 04/15/18  Yes McLean-Scocuzza, Nino Glow, MD  metroNIDAZOLE (METROGEL) 0.75 % vaginal gel Place 1 Applicatorful vaginally 2 (two) times daily. Prn BV 06/13/19  Yes McLean-Scocuzza, Nino Glow, MD  Multiple Vitamin (MULTI-VITAMIN PO) Take 1 tablet by mouth daily.   Yes [provider]  mupirocin ointment (BACTROBAN) 2 % Apply 1 application topically 2 (two) times daily. To open sores as needed 07/26/18  Yes McLean-Scocuzza, Nino Glow, MD  [facility] DELICA LANCETS 62I MISC USE TO TEST BLOOD SUGAR ONCE A DAY 11/30/17  Yes [provider]  pantoprazole (PROTONIX) 40 MG tablet Take 1 tablet (40 mg total) by mouth daily. 30 minutes before breakfast OR dinner 04/23/19  Yes McLean-Scocuzza, Nino Glow, MD  pilocarpine (PILOCAR) 4 % ophthalmic solution Place 1 drop into the right eye 4 (four) times daily.    Yes [provider]  polyethylene glycol (MIRALAX) packet Take 17 g by mouth daily. prn 04/15/18  Yes McLean-Scocuzza, Nino Glow, MD  Polysacch Fe Complex-Vit D3 (NOVAFERRUM 125) 125-100 MG-UNT/5ML LIQD Take 125 mg by mouth daily. 12/06/18  Yes McLean-Scocuzza, Nino Glow, MD  Potassium Chloride ER 20 MEQ TBCR TAKE 1 TABLET BY MOUTH EVERY DAY WITH FLUID PILL 04/16/19  Yes Gollan, Kathlene November, MD  Semaglutide,0.25 or 0.5MG/DOS, (OZEMPIC, 0.25 OR 0.5 MG/DOSE,) 2 MG/1.5ML SOPN Inject 0.25 mg into the skin once a week. 06/13/19  Yes McLean-Scocuzza, Nino Glow, MD  sertraline (ZOLOFT) 50 MG tablet  Take 1 tablet (50 mg total) by mouth daily. 06/04/19  Yes McLean-Scocuzza, Nino Glow, MD  sitaGLIPtin (JANUVIA) 25 MG tablet Take 1 tablet (25 mg total) by mouth daily. D/c 50 mg 06/13/19  Yes McLean-Scocuzza, Nino Glow, MD  torsemide (DEMADEX) 20 MG tablet Take 30MG by mouth every other day alternating with 40MG by mouth on  alternating days. 06/25/19  Yes Loel Dubonnet, NP  traZODone (DESYREL) 100 MG tablet Take 0.5-1 tablets (50-100 mg total) by mouth at bedtime as needed. 06/05/19  Yes McLean-Scocuzza, Nino Glow, MD  triamcinolone cream (KENALOG) 0.1 % Apply 1 application topically 2 (two) times daily. Prn legs 08/16/18  Yes McLean-Scocuzza, Nino Glow, MD  vitamin C (ASCORBIC ACID) 500 MG tablet Take 1 tablet (500 mg total) by mouth daily. 04/15/18  Yes McLean-Scocuzza, Nino Glow, MD  Zinc Oxide 10 % OINT Apply 1 application topically daily as needed (rash). Applied to patient's bottom 04/15/18  Yes McLean-Scocuzza, Nino Glow, MD  mupirocin ointment (BACTROBAN) 2 % Apply topically 2 (two) times daily. 11/16/18   McLean-Scocuzza, Nino Glow, MD    Review of Systems  Constitutional: Positive for fatigue (easily). Negative for appetite change.  HENT: Negative for congestion, postnasal drip and sore throat.   Eyes: Negative.   Respiratory: Positive for shortness of breath. Negative for cough and chest tightness.   Cardiovascular: Positive for leg swelling. Negative for chest pain and palpitations.  Gastrointestinal: Negative for abdominal distention and abdominal pain.  Endocrine: Negative.   Genitourinary: Negative.   Musculoskeletal: Positive for arthralgias (left knee). Negative for back pain and neck pain.  Skin: Negative.   Allergic/Immunologic: Negative.   Neurological: Negative for dizziness and light-headedness.  Hematological: Negative for adenopathy. Does not bruise/bleed easily.  Psychiatric/Behavioral: Positive for sleep disturbance (sleeps in recliner). Negative for dysphoric mood. The patient is not  nervous/anxious.    Vitals:   07/03/19 1123  BP: 134/71  Pulse: 74  Resp: 16  SpO2: 99%  Weight: 187 lb 6 oz (85 kg)  Height: 5' (1.524 m)   Wt Readings from Last 3 Encounters:  07/03/19 187 lb 6 oz (85 kg)  06/13/19 186 lb (84.4 kg)  06/10/19 187 lb (84.8 kg)   Lab Results  Component Value Date   CREATININE 1.40 (H) 04/22/2019   CREATININE 1.58 (H) 04/17/2019   CREATININE 1.56 (H) 04/17/2019     Physical Exam Vitals and nursing note reviewed.  Constitutional:      Appearance: Normal appearance.  HENT:     Head: Normocephalic and atraumatic.  Cardiovascular:     Rate and Rhythm: Normal rate and regular rhythm.  Pulmonary:     Effort: Pulmonary effort is normal. No respiratory distress.     Breath sounds: No wheezing, rhonchi or rales.  Abdominal:     General: There is no distension.     Palpations: Abdomen is soft.  Musculoskeletal:        General: No tenderness.     Cervical back: Normal range of motion and neck supple.     Right lower leg: Edema (1+ pitting ) present.     Left lower leg: Edema (1+ pitting ) present.  Skin:    General: Skin is warm and dry.  Neurological:     General: No focal deficit present.     Mental Status: She is alert and oriented to person, place, and time.  Psychiatric:        Mood and Affect: Mood normal.        Behavior: Behavior normal.    Assessment & Plan:  1: Chronic heart failure with preserved ejection fraction- - NYHA class III - euvolemic today - weighing daily; reminded to call for an overnight weight gain of >2 pounds or a weekly weight gain of >5 pounds - weight stable since last visit 3 weeks ago - not adding salt  to her food except a "sprinkle" when eating seafood.  - currently taking 54m torsemide QOD alternating with 472mQOD which seems to be keeping symptoms & BP stable right now - saw cardiology (WGilford Rile1/13/21 & returns July 2021 - BNP 04/17/19 was 77.0 - daughter will let me know if they decide to pursue  palliative care with patient - PharmD reconciled medications with the patient and daughter  2: HTN- - BP looks good today - had virtual visit with PCP (McLean-Scocuzza) 06/13/19 & returns July 2021 - BMP from 04/29/19 reviewed and showed sodium 139, potassium 4.4, creatinine 1.39 and GFR 39  3: DM- - A1c 03/13/19 was 8.3% - nonfasting glucose at home today was 190 - saw nephrology (K[place]) 05/22/19 & returns 09/23/19  4: Lymphedema- - stage 2 - saw vascular 08/23/2018 - elevating her legs "at times"; encouraged her to elevate her legs when sitting for long periods of time - has been wearing support socks daily & currently has both lower legs wrapped - limited in her ability to exercise due to her shortness of breath - had been wearing compression boots but hasn't worn them much recently   Medication bottles were reviewed.    Return in 6 months or sooner for any questions/problems before then.

## 2019-07-03 ENCOUNTER — Ambulatory Visit: Payer: PPO | Attending: Family | Admitting: Family

## 2019-07-03 ENCOUNTER — Other Ambulatory Visit: Payer: Self-pay

## 2019-07-03 ENCOUNTER — Other Ambulatory Visit: Payer: Self-pay | Admitting: Internal Medicine

## 2019-07-03 ENCOUNTER — Encounter: Payer: Self-pay | Admitting: Family

## 2019-07-03 VITALS — BP 134/71 | HR 74 | Resp 16 | Ht 60.0 in | Wt 187.4 lb

## 2019-07-03 DIAGNOSIS — Z8673 Personal history of transient ischemic attack (TIA), and cerebral infarction without residual deficits: Secondary | ICD-10-CM | POA: Insufficient documentation

## 2019-07-03 DIAGNOSIS — Z953 Presence of xenogenic heart valve: Secondary | ICD-10-CM | POA: Diagnosis not present

## 2019-07-03 DIAGNOSIS — Z7982 Long term (current) use of aspirin: Secondary | ICD-10-CM | POA: Diagnosis not present

## 2019-07-03 DIAGNOSIS — E785 Hyperlipidemia, unspecified: Secondary | ICD-10-CM | POA: Insufficient documentation

## 2019-07-03 DIAGNOSIS — Z833 Family history of diabetes mellitus: Secondary | ICD-10-CM | POA: Diagnosis not present

## 2019-07-03 DIAGNOSIS — F329 Major depressive disorder, single episode, unspecified: Secondary | ICD-10-CM | POA: Diagnosis not present

## 2019-07-03 DIAGNOSIS — K219 Gastro-esophageal reflux disease without esophagitis: Secondary | ICD-10-CM | POA: Insufficient documentation

## 2019-07-03 DIAGNOSIS — Z794 Long term (current) use of insulin: Secondary | ICD-10-CM

## 2019-07-03 DIAGNOSIS — Z888 Allergy status to other drugs, medicaments and biological substances status: Secondary | ICD-10-CM | POA: Diagnosis not present

## 2019-07-03 DIAGNOSIS — I890 Lymphedema, not elsewhere classified: Secondary | ICD-10-CM | POA: Insufficient documentation

## 2019-07-03 DIAGNOSIS — I110 Hypertensive heart disease with heart failure: Secondary | ICD-10-CM | POA: Insufficient documentation

## 2019-07-03 DIAGNOSIS — Z79899 Other long term (current) drug therapy: Secondary | ICD-10-CM | POA: Insufficient documentation

## 2019-07-03 DIAGNOSIS — I5032 Chronic diastolic (congestive) heart failure: Secondary | ICD-10-CM | POA: Diagnosis not present

## 2019-07-03 DIAGNOSIS — E119 Type 2 diabetes mellitus without complications: Secondary | ICD-10-CM | POA: Diagnosis not present

## 2019-07-03 DIAGNOSIS — I509 Heart failure, unspecified: Secondary | ICD-10-CM | POA: Diagnosis present

## 2019-07-03 DIAGNOSIS — H409 Unspecified glaucoma: Secondary | ICD-10-CM | POA: Diagnosis not present

## 2019-07-03 DIAGNOSIS — Z8379 Family history of other diseases of the digestive system: Secondary | ICD-10-CM | POA: Diagnosis not present

## 2019-07-03 DIAGNOSIS — E1165 Type 2 diabetes mellitus with hyperglycemia: Secondary | ICD-10-CM

## 2019-07-03 DIAGNOSIS — I10 Essential (primary) hypertension: Secondary | ICD-10-CM

## 2019-07-03 DIAGNOSIS — M109 Gout, unspecified: Secondary | ICD-10-CM | POA: Insufficient documentation

## 2019-07-03 DIAGNOSIS — E1122 Type 2 diabetes mellitus with diabetic chronic kidney disease: Secondary | ICD-10-CM

## 2019-07-03 MED ORDER — SITAGLIPTIN PHOSPHATE 25 MG PO TABS: 25.0000 mg | ORAL_TABLET | Freq: Every day | ORAL | 3 refills | Status: DC

## 2019-07-03 MED ORDER — PEN NEEDLES 31G X 8 MM MISC: 1.0000 "pen " | Freq: Every day | 3 refills | Status: AC

## 2019-07-03 MED ORDER — TRESIBA FLEXTOUCH 100 UNIT/ML ~~LOC~~ SOPN: 10.0000 [IU] | PEN_INJECTOR | Freq: Every day | SUBCUTANEOUS | 11 refills | Status: DC

## 2019-07-03 NOTE — Telephone Encounter (Signed)
Lowest sugar in the last few days was 167 (fasting) and highest was 226 (non-fasting)

## 2019-07-03 NOTE — Progress Notes (Signed)
[facility] - PHARMACIST COUNSELING NOTE  ADHERENCE ASSESSMENT  Adherence strategy: Patient lives at home. Utilizing a pillbox. Daughters helps patients with medications. Daughter reports patient does report some issues with dysphagia.    Do you ever forget to take your medication? _0 Yes (1) _1 No (0)  Do you ever skip doses due to side effects? _2 Yes (1) _3 No (0)  Do you have trouble affording your medicines? _4 Yes (1) _5 No (0)  Are you ever unable to pick up your medication due to transportation difficulties? _6 Yes (1) _7 No (0)  Do you ever stop taking your medications because you don't believe they are helping? _8 Yes (1) _9 No (0)  Total score 0   Recommendations given to patient about increasing adherence: None needed. Daughter reports some issues with affordability but family is helping with medication costs.   Guideline-Directed Medical Therapy/Evidence Based Medicine  ACE/ARB/ARNI: None Beta Blocker: Metoprolol succinate 25 mg daily Aldosterone Antagonist: None Diuretic: Torsemide 30 mg EOD alternating with 40 mg EOD    SUBJECTIVE   Past Medical History:  Diagnosis Date  . Anemia   . Carotid arterial disease ([place])    a. 08/2014 U/S: Bilateral < 50% stenosis. Patent vertebrals w/ antegrade flow.   . Cervical spine arthritis   . Chronic diastolic CHF (congestive heart failure) ([place])    a. echo 2014: EF 55-60%, DD;  b. 08/2014 Echo: EF 55-60%, no RWMA, GR1DD; c. 02/2016 Echo: EF 65-70%, Gr1 DD.  Marland Kitchen Cognitive impairment   . Depression   . Diabetes mellitus without complication ([place])   . Essential hypertension   . GERD (gastroesophageal reflux disease)   . Glaucoma   . Gout   . History of renal impairment   . Hyperlipidemia   . Hypotension    a. Related to Norvasc - 11/2014 ED visit.  . Multinodular goiter    a. Noted incidentally on CT 07/2012 and carotid U/S 08/2014;  b. Nl TSH 11/2014.  Marland Kitchen Paroxysmal SVT  (supraventricular tachycardia) ([facility])    a. on Toprol   . Prolapsed uterus   . Severe aortic stenosis    a. 11/15/16: s/p TAVR Edwards Sapien 3 THV (size 23 mm, model # 9600TFX, serial # [id number])  . Stroke [facility])    a. CT head 07/2014 showed small thalamic infarct  . Vitamin deficiency         OBJECTIVE   Vital signs: HR 74, BP 134/71, weight (pounds) 187 ECHO: Date 03/09/2018, EF 50-55%, notes: grade 2 diastolic dysfunction, TAVR  BMP Latest Ref Rng & Units 04/22/2019 04/17/2019 04/17/2019  Glucose 70 - 99 mg/dL 153(H) 133(H) 116(H)  BUN 8 - 23 mg/dL 24(H) 26(H) 25(H)  Creatinine 0.44 - 1.00 mg/dL 1.40(H) 1.58(H) 1.56(H)  BUN/Creat Ratio 12 - 28 - - -  Sodium 135 - 145 mmol/L 141 139 139  Potassium 3.5 - 5.1 mmol/L 4.0 4.1 4.2  Chloride 98 - 111 mmol/L 100 98 97  CO2 22 - 32 mmol/L 32 28 35(H)  Calcium 8.9 - 10.3 mg/dL 9.6 9.4 9.8    ASSESSMENT Patient is a 86 y/o F with past medical history including diastolic CHF, hypertension, severe aortic stenosis s/p TAVR, PAD, diabetes, CKD stage 4, lymphedema, hyperlipidemia, cognitive impairment, gout who presents to clinic for chronic fatigue upon minimal exertion. Patient accompanied by daughter. Patient drowsy in office.   PLAN  1). HFpEF -Stable on home diuretic regimen. Euvolemic in office.  -Discussed with provider -no changes today  2). Hypertension -BP controlled  in office at 134/71 -Antihypertensives include metoprolol and torsemide  3). Diabetes -Most recent HgbA1c was 8.3% on 03/13/2019, less intensive glucose control likely needed given age -Antidiabetic regimen includes glipizide 2.5 mg with breakfast, sliding scale insulin TIDAC, Ozempic 0.25 mg qWeek, and sitagliptin 25 mg daily (max recommended dose based on renal function) -Previous intolerance on Ozempic 0.5 mg (nausea / lack of appetite) - however, daughter reports patient is requiring more insulin since Ozempic dose was decreased to 0.25 mg.   4). ASCVD -History  of PAD -Aspirin + lovastatin  5). CKD stage IV -Scr 1.4, GFR 38 on 04/22/19   Time spent: 15 minutes  [place] Resident 07/03/2019 10:55 AM    Current Outpatient Medications:  .  ACCU-CHEK AVIVA PLUS test strip, USE 1 STRIP TO TEST BLOOD SUGARS 3 TIMES A DAY, Disp: 300 each, Rfl: 4 .  acetaminophen (TYLENOL) 500 MG tablet, Take 1 tablet (500 mg total) by mouth every 6 (six) hours as needed for mild pain or headache., Disp: 360 tablet, Rfl: 3 .  allopurinol (ZYLOPRIM) 100 MG tablet, Take 1 tablet (100 mg total) by mouth daily., Disp: 90 tablet, Rfl: 3 .  aspirin 81 MG chewable tablet, Chew 1 tablet (81 mg total) by mouth daily., Disp: 90 tablet, Rfl: 3 .  blood glucose meter kit and supplies, Dispense based on patient and insurance preference. Use 1x daily as directed. (FOR ICD-10 E10.9, E11.9)., Disp: 1 each, Rfl: 0 .  brimonidine (ALPHAGAN P) 0.1 % SOLN, Place 1 drop into the right eye 2 (two) times daily., Disp: , Rfl:  .  Carboxymethylcellulose Sodium (ARTIFICIAL TEARS OP), Apply 1 drop to eye 2 (two) times daily. Left eye, Disp: , Rfl:  .  cholecalciferol (VITAMIN D) 25 MCG (1000 UT) tablet, Take 1 tablet (1,000 Units total) by mouth daily., Disp: 90 tablet, Rfl: 3 .  clotrimazole (LOTRIMIN) 1 % cream, APPLY TOPICALLY TO THE AFFECTED AREA ONCE A DAY AT BEDTIME FOR 7-14 DAYS, Disp: 60 g, Rfl: 11 .  Continuous Blood Gluc Receiver (FREESTYLE LIBRE 14 DAY READER) DEVI, 1 Device by Does not apply route every 14 (fourteen) days., Disp: 2 each, Rfl: 11 .  Continuous Blood Gluc Sensor (FREESTYLE LIBRE 14 DAY SENSOR) MISC, 1 Device by Does not apply route every 14 (fourteen) days., Disp: 2 each, Rfl: 11 .  dimethicone (PROSHIELD PLUS SKIN PROTECTANT) 1 % cream, 1 application to the buttock 2 times daily to help protect skin, Disp: 114 g, Rfl: 11 .  Dorzolamide HCl-Timolol Mal (COSOPT OP), Place 1 drop into the right eye 2 (two) times daily. , Disp: , Rfl:  .  fluticasone  (FLONASE) 50 MCG/ACT nasal spray, Place 1 spray into both nostrils daily. Prn max 2 sprays total each nose, Disp: 16 g, Rfl: 11 .  gabapentin (NEURONTIN) 300 MG capsule, Take 1 capsule (300 mg total) by mouth 2 (two) times daily., Disp: 180 capsule, Rfl: 3 .  glipiZIDE (GLUCOTROL) 5 MG tablet, Take 0.5 tablets (2.5 mg total) by mouth daily before breakfast., Disp:  , Rfl:  .  glucose blood test strip, Accu-Chek Aviva Plus test strips, Disp: 300 each, Rfl: 4 .  glucose blood test strip, Bid Use as instructed E11.9, Disp: 200 each, Rfl: 12 .  insulin aspart (NOVOLOG FLEXPEN) 100 UNIT/ML FlexPen, TID before meals 15-30 minutes based on sugar reading before meals If sugar: 70-130 0 units, 131-180 2 units, 181-240 4 units, 241-300 6 units, 301-350 8 units, 351-400 10 units, >  400 12 units and call doctor, Disp: 15 mL, Rfl: 11 .  Insulin Pen Needle (PEN NEEDLES) 30G X 8 MM MISC, 1 Device by Does not apply route once a week., Disp: 30 each, Rfl: 11 .  Insulin Pen Needle (PEN NEEDLES) 30G X 8 MM MISC, 1 Device by Does not apply route 3 (three) times daily before meals., Disp: 270 each, Rfl: 3 .  Lancet Device MISC, 1 Device by Does not apply route as directed., Disp: 1 each, Rfl: 0 .  Lancets (ONETOUCH ULTRASOFT) lancets, Bid E 11.9 Use as instructed, Disp: 200 each, Rfl: 12 .  latanoprost (XALATAN) 0.005 % ophthalmic solution, Place 1 drop into the right eye at bedtime. , Disp: , Rfl:  .  LORazepam (ATIVAN) 0.5 MG tablet, Take 0.5-1 tablets (0.25-0.5 mg total) by mouth at bedtime as needed for anxiety., Disp: 30 tablet, Rfl: 5 .  lovastatin (MEVACOR) 20 MG tablet, Take 1 tablet (20 mg total) by mouth at bedtime. Note reduced dose due to age and chronic medical issues d/c 40 mg dose, Disp: 90 tablet, Rfl: 3 .  metoprolol succinate (TOPROL-XL) 25 MG 24 hr tablet, Take 1 tablet (25 mg total) by mouth daily., Disp: 90 tablet, Rfl: 3 .  metroNIDAZOLE (METROGEL) 0.75 % vaginal gel, Place 1 Applicatorful vaginally 2  (two) times daily. Prn BV, Disp: 70 g, Rfl: 2 .  Multiple Vitamin (MULTI-VITAMIN PO), Take 1 tablet by mouth daily., Disp: , Rfl:  .  mupirocin ointment (BACTROBAN) 2 %, Apply 1 application topically 2 (two) times daily. To open sores as needed, Disp: 30 g, Rfl: 2 .  mupirocin ointment (BACTROBAN) 2 %, Apply topically 2 (two) times daily., Disp: 30 g, Rfl: 2 .  ondansetron (ZOFRAN) 4 MG tablet, Take 1 tablet (4 mg total) by mouth 2 (two) times daily as needed for nausea or vomiting., Disp: 30 tablet, Rfl: 5 .  ONETOUCH DELICA LANCETS 58N MISC, USE TO TEST BLOOD SUGAR ONCE A DAY, Disp: , Rfl: 0 .  pantoprazole (PROTONIX) 40 MG tablet, Take 1 tablet (40 mg total) by mouth daily. 30 minutes before breakfast OR dinner, Disp: 90 tablet, Rfl: 3 .  pilocarpine (PILOCAR) 4 % ophthalmic solution, Place 1 drop into the right eye 4 (four) times daily. , Disp: , Rfl:  .  polyethylene glycol (MIRALAX) packet, Take 17 g by mouth daily. prn, Disp: 30 each, Rfl: 11 .  Polysacch Fe Complex-Vit D3 (NOVAFERRUM 125) 125-100 MG-UNT/5ML LIQD, Take 125 mg by mouth daily., Disp: 360 mL, Rfl: 11 .  Potassium Chloride ER 20 MEQ TBCR, TAKE 1 TABLET BY MOUTH EVERY DAY WITH FLUID PILL, Disp: 90 tablet, Rfl: 1 .  Semaglutide,0.25 or 0.5MG/DOS, (OZEMPIC, 0.25 OR 0.5 MG/DOSE,) 2 MG/1.5ML SOPN, Inject 0.25 mg into the skin once a week., Disp: 5 pen, Rfl: 11 .  sertraline (ZOLOFT) 50 MG tablet, Take 1 tablet (50 mg total) by mouth daily., Disp: 90 tablet, Rfl: 3 .  sitaGLIPtin (JANUVIA) 25 MG tablet, Take 1 tablet (25 mg total) by mouth daily. D/c 50 mg, Disp: 90 tablet, Rfl: 3 .  Sodium Chloride Flush (NORMAL SALINE FLUSH) 0.9 % SOLN, sodium chloride 0.9 % intravenous solution, Disp: , Rfl:  .  torsemide (DEMADEX) 20 MG tablet, Take 30MG by mouth every other day alternating with 40MG by mouth on alternating days., Disp: 180 tablet, Rfl: 0 .  traZODone (DESYREL) 100 MG tablet, Take 0.5-1 tablets (50-100 mg total) by mouth at bedtime  as needed., Disp: 90  tablet, Rfl: 3 .  triamcinolone cream (KENALOG) 0.1 %, Apply 1 application topically 2 (two) times daily. Prn legs, Disp: 80 g, Rfl: 0 .  vitamin C (ASCORBIC ACID) 500 MG tablet, Take 1 tablet (500 mg total) by mouth daily., Disp: 90 tablet, Rfl: 3 .  Zinc Oxide 10 % OINT, Apply 1 application topically daily as needed (rash). Applied to patient's bottom, Disp: 226.8 g, Rfl: 11   COUNSELING POINTS/CLINICAL PEARLS  Metoprolol Succinate (Goal: 200 mg once daily) Warn patient to avoid activities requiring mental alertness or coordination until drug effects are realized, as drug may cause dizziness. Tell patient planning major surgery with anesthesia to alert physician that drug is being used, as drug impairs ability of heart to respond to reflex adrenergic stimuli. Drug may cause diarrhea, fatigue, headache, or depression. Advise diabetic patient to carefully monitor blood glucose as drug may mask symptoms of hypoglycemia. Patient should take extended-release tablet with or immediately following meals. Counsel patient against sudden discontinuation of drug, as this may precipitate hypertension, angina, or myocardial infarction. In the event of a missed dose, counsel patient to skip the missed dose and maintain a regular dosing schedule. Torsemide  Side effects may include excessive urination.  Tell patient to report symptoms of ototoxicity.  Instruct patient to report lightheadedness or syncope.  Warn patient to avoid use of nonprescription NSAID products without first discussing it with their healthcare provider.  DRUGS TO AVOID IN HEART FAILURE  Drug or Class Mechanism  Analgesics . NSAIDs . COX-2 inhibitors . Glucocorticoids  Sodium and water retention, increased systemic vascular resistance, decreased response to diuretics   Diabetes Medications . Metformin . Thiazolidinediones o Rosiglitazone (Avandia) o Pioglitazone (Actos) . DPP4 Inhibitors o Saxagliptin  (Onglyza) o Sitagliptin (Januvia)   Lactic acidosis Possible calcium channel blockade   Unknown  Antiarrhythmics . Class I  o Flecainide o Disopyramide . Class III o Sotalol . Other o Dronedarone  Negative inotrope, proarrhythmic   Proarrhythmic, beta blockade  Negative inotrope  Antihypertensives . Alpha Blockers o Doxazosin . Calcium Channel Blockers o Diltiazem o Verapamil o Nifedipine . Central Alpha Adrenergics o Moxonidine . Peripheral Vasodilators o Minoxidil  Increases renin and aldosterone  Negative inotrope    Possible sympathetic withdrawal  Unknown  Anti-infective . Itraconazole . Amphotericin B  Negative inotrope Unknown  Hematologic . Anagrelide . Cilostazol   Possible inhibition of PD IV Inhibition of PD III causing arrhythmias  Neurologic/Psychiatric . Stimulants . Anti-Seizure Drugs o Carbamazepine o Pregabalin . Antidepressants o Tricyclics o Citalopram . Parkinsons o Bromocriptine o Pergolide o Pramipexole . Antipsychotics o Clozapine . Antimigraine o Ergotamine o Methysergide . Appetite suppressants . Bipolar o Lithium  Peripheral alpha and beta agonist activity  Negative inotrope and chronotrope Calcium channel blockade  Negative inotrope, proarrhythmic Dose-dependent QT prolongation  Excessive serotonin activity/valvular damage Excessive serotonin activity/valvular damage Unknown  IgE mediated hypersensitivy, calcium channel blockade  Excessive serotonin activity/valvular damage Excessive serotonin activity/valvular damage Valvular damage  Direct myofibrillar degeneration, adrenergic stimulation  Antimalarials . Chloroquine . Hydroxychloroquine Intracellular inhibition of lysosomal enzymes  Urologic Agents . Alpha Blockers o Doxazosin o Prazosin o Tamsulosin o Terazosin  Increased renin and aldosterone  Adapted from Page RL, et al. "Drugs That May Cause or Exacerbate Heart Failure: A Scientific  Statement from the [place]." Circulation 2016; 762:G31-D17. DOI: 10.1161/CIR.0000000000000426   MEDICATION ADHERENCES TIPS AND STRATEGIES 1. Taking medication as prescribed improves patient outcomes in heart failure (reduces hospitalizations, improves symptoms, increases survival) 2.  Side effects of medications can be managed by decreasing doses, switching agents, stopping drugs, or adding additional therapy. Please let someone in the [facility] know if you have having bothersome side effects so we can modify your regimen. Do not alter your medication regimen without talking to [place].  3. Medication reminders can help patients remember to take drugs on time. If you are missing or forgetting doses you can try linking behaviors, using pill boxes, or an electronic reminder like an alarm on your phone or an app. Some people can also get automated phone calls as medication reminders.

## 2019-07-03 NOTE — Patient Instructions (Signed)
Continue weighing daily and call for an overnight weight gain of > 2 pounds or a weekly weight gain of >5 pounds. 

## 2019-07-03 NOTE — Telephone Encounter (Addendum)
Patient's daughter informed. States pt's sugars are high fasting and increase throughout the day.   Agreeable to stopping the glipizide and starting a daily insulin. They verified understanding that this meant pt would now have 3 injectable medications.   They would like the 25 mg pills of Januvia sent in so they will no longer have to cut the pills in half.   Pharmacy used is [facility]

## 2019-07-05 ENCOUNTER — Telehealth: Payer: Self-pay | Admitting: Internal Medicine

## 2019-07-05 ENCOUNTER — Other Ambulatory Visit: Payer: Self-pay

## 2019-07-05 DIAGNOSIS — E1165 Type 2 diabetes mellitus with hyperglycemia: Secondary | ICD-10-CM

## 2019-07-05 MED ORDER — TRESIBA FLEXTOUCH 100 UNIT/ML ~~LOC~~ SOPN: 10.0000 [IU] | PEN_INJECTOR | Freq: Every day | SUBCUTANEOUS | 11 refills | Status: AC

## 2019-07-05 NOTE — Telephone Encounter (Signed)
Spoken to daughter. She stated the pharmacy did not receive medication. I have resent it to patient pharmacy.

## 2019-07-05 NOTE — Telephone Encounter (Signed)
Patient's daughter called and her mother insulin has not been called into patient's pharmacy.

## 2019-07-08 ENCOUNTER — Telehealth: Payer: Self-pay | Admitting: Internal Medicine

## 2019-07-08 NOTE — Telephone Encounter (Signed)
Pt daughter called and wanted to get some info on freestyle and how it works

## 2019-07-08 NOTE — Telephone Encounter (Signed)
Would need to be printed or uploaded to My chart .

## 2019-07-08 NOTE — Telephone Encounter (Signed)
Patients daughter informed and verbalized understanding

## 2019-07-08 NOTE — Telephone Encounter (Signed)
Patient's daughter is wanting to know how to share the patient's data with the office. Do we have a [employer] account that the patient can send this to or should we have the patient print this each visit?

## 2019-07-11 ENCOUNTER — Telehealth: Payer: Self-pay | Admitting: Internal Medicine

## 2019-07-11 NOTE — Telephone Encounter (Signed)
Patient daughter calling in wanting an rx for the test strips that go along with the freestyle libre. This way they can check her number against what the arm sensor says.   They are also wanting to know what range to set for the meter. What is patient's sugar range (lower than and higher than)

## 2019-07-16 ENCOUNTER — Ambulatory Visit (INDEPENDENT_AMBULATORY_CARE_PROVIDER_SITE_OTHER): Payer: PPO

## 2019-07-16 ENCOUNTER — Other Ambulatory Visit: Payer: Self-pay

## 2019-07-16 ENCOUNTER — Other Ambulatory Visit (INDEPENDENT_AMBULATORY_CARE_PROVIDER_SITE_OTHER): Payer: PPO

## 2019-07-16 DIAGNOSIS — M7989 Other specified soft tissue disorders: Secondary | ICD-10-CM | POA: Diagnosis not present

## 2019-07-16 DIAGNOSIS — M79672 Pain in left foot: Secondary | ICD-10-CM | POA: Diagnosis not present

## 2019-07-16 DIAGNOSIS — E1122 Type 2 diabetes mellitus with diabetic chronic kidney disease: Secondary | ICD-10-CM

## 2019-07-16 DIAGNOSIS — N184 Chronic kidney disease, stage 4 (severe): Secondary | ICD-10-CM

## 2019-07-16 DIAGNOSIS — I10 Essential (primary) hypertension: Secondary | ICD-10-CM

## 2019-07-16 LAB — COMPREHENSIVE METABOLIC PANEL
ALT: 12 U/L (ref 0–35)
AST: 22 U/L (ref 0–37)
Albumin: 3.8 g/dL (ref 3.5–5.2)
Alkaline Phosphatase: 56 U/L (ref 39–117)
BUN: 14 mg/dL (ref 6–23)
CO2: 36 mEq/L — ABNORMAL HIGH (ref 19–32)
Calcium: 9.7 mg/dL (ref 8.4–10.5)
Chloride: 98 mEq/L (ref 96–112)
Creatinine, Ser: 1.39 mg/dL — ABNORMAL HIGH (ref 0.40–1.20)
GFR: 42.84 mL/min — ABNORMAL LOW (ref >60.00)
Glucose, Bld: 126 mg/dL — ABNORMAL HIGH (ref 70–99)
Potassium: 4 mEq/L (ref 3.5–5.1)
Sodium: 139 mEq/L (ref 135–145)
Total Bilirubin: 0.4 mg/dL (ref 0.2–1.2)
Total Protein: 7.7 g/dL (ref 6.0–8.3)

## 2019-07-16 LAB — CBC WITH DIFFERENTIAL/PLATELET
Basophils Absolute: 0.1 10*3/uL (ref 0.0–0.1)
Basophils Relative: 0.9 % (ref 0.0–3.0)
Eosinophils Absolute: 0.4 10*3/uL (ref 0.0–0.7)
Eosinophils Relative: 4.2 % (ref 0.0–5.0)
HCT: 34.6 % — ABNORMAL LOW (ref 36.0–46.0)
Hemoglobin: 11.5 g/dL — ABNORMAL LOW (ref 12.0–15.0)
Lymphocytes Relative: 11.1 % — ABNORMAL LOW (ref 12.0–46.0)
Lymphs Abs: 1.1 10*3/uL (ref 0.7–4.0)
MCHC: 33.2 g/dL (ref 30.0–36.0)
MCV: 95.8 fl (ref 78.0–100.0)
Monocytes Absolute: 0.7 10*3/uL (ref 0.1–1.0)
Monocytes Relative: 6.6 % (ref 3.0–12.0)
Neutro Abs: 7.6 10*3/uL (ref 1.4–7.7)
Neutrophils Relative %: 77.2 % — ABNORMAL HIGH (ref 43.0–77.0)
Platelets: 195 10*3/uL (ref 150.0–400.0)
RBC: 3.61 Mil/uL — ABNORMAL LOW (ref 3.87–5.11)
RDW: 15.3 % (ref 11.5–15.5)
WBC: 9.8 10*3/uL (ref 4.0–10.5)

## 2019-07-16 LAB — LIPID PANEL
Cholesterol: 167 mg/dL (ref 0–200)
HDL: 36.4 mg/dL — ABNORMAL LOW (ref >39.00)
LDL Cholesterol: 96 mg/dL (ref 0–99)
NonHDL: 130.84
Total CHOL/HDL Ratio: 5
Triglycerides: 172 mg/dL — ABNORMAL HIGH (ref 0.0–149.0)
VLDL: 34.4 mg/dL (ref 0.0–40.0)

## 2019-07-16 LAB — HEMOGLOBIN A1C: Hgb A1c MFr Bld: 7.2 % — ABNORMAL HIGH (ref 4.6–6.5)

## 2019-07-18 ENCOUNTER — Other Ambulatory Visit: Payer: Self-pay | Admitting: Internal Medicine

## 2019-07-18 DIAGNOSIS — E1165 Type 2 diabetes mellitus with hyperglycemia: Secondary | ICD-10-CM

## 2019-07-18 MED ORDER — GLUCOSE BLOOD VI STRP: ORAL_STRIP | 3 refills | Status: DC

## 2019-07-18 NOTE — Telephone Encounter (Signed)
Call pharmacy and see what they need and send it in please  No need to use glucose meter and freestyle libre  What do they need ?    [place]

## 2019-07-19 NOTE — Telephone Encounter (Signed)
Left message to return call 

## 2019-07-19 NOTE — Telephone Encounter (Signed)
Spoke with the pharmacist and they state One touch strips were just dispensed to the Patient.

## 2019-07-22 NOTE — Telephone Encounter (Signed)
Left message to return call 

## 2019-07-22 NOTE — Telephone Encounter (Signed)
[name] (pt's daughter) returned your call

## 2019-07-24 ENCOUNTER — Other Ambulatory Visit: Payer: Self-pay

## 2019-07-24 ENCOUNTER — Telehealth: Payer: Self-pay | Admitting: Internal Medicine

## 2019-07-24 NOTE — Telephone Encounter (Signed)
Informed that Dr Olivia Mackie is not in the office and I will send this in.   ,Patient having trouble sleeping and is up, restless through out the night. The Lorazepam will help her sleep sometimes. Has tried Melatonin OTC 10 mg but is not helping. Please advise

## 2019-07-24 NOTE — Telephone Encounter (Signed)
Pt's daughter wants to know if it is ok to give the pt benadryl to help her sleep? She would like a call back.

## 2019-07-25 ENCOUNTER — Other Ambulatory Visit: Payer: Self-pay | Admitting: Internal Medicine

## 2019-07-25 ENCOUNTER — Ambulatory Visit: Payer: PPO | Admitting: Internal Medicine

## 2019-07-25 DIAGNOSIS — G4700 Insomnia, unspecified: Secondary | ICD-10-CM

## 2019-07-25 MED ORDER — LORAZEPAM 1 MG PO TABS: 1.0000 mg | ORAL_TABLET | Freq: Every evening | ORAL | 2 refills | Status: AC | PRN

## 2019-07-25 NOTE — Telephone Encounter (Signed)
No to benadryl  Increased ativan to 1 mg qhs prn  Does she want palliative care to help manage her moms sx's at home?  I will still be her doctor as well but they can help with issues like this in the home and come to the home?   [place]

## 2019-07-26 ENCOUNTER — Ambulatory Visit: Payer: PPO | Admitting: Internal Medicine

## 2019-07-26 NOTE — Telephone Encounter (Signed)
See later phone encounter

## 2019-07-26 NOTE — Addendum Note (Signed)
Addended by: Orland Mustard on: 07/26/2019 06:55 PM   Modules accepted: Orders

## 2019-07-26 NOTE — Telephone Encounter (Signed)
Patient's daughter informed and verbalized understanding.    They are agreeable to palliative care referral.

## 2019-07-30 ENCOUNTER — Telehealth: Payer: Self-pay | Admitting: Internal Medicine

## 2019-07-30 ENCOUNTER — Other Ambulatory Visit: Payer: Self-pay | Admitting: Internal Medicine

## 2019-07-30 DIAGNOSIS — I350 Nonrheumatic aortic (valve) stenosis: Secondary | ICD-10-CM

## 2019-07-30 DIAGNOSIS — I10 Essential (primary) hypertension: Secondary | ICD-10-CM

## 2019-07-30 MED ORDER — METOPROLOL SUCCINATE ER 25 MG PO TB24: 25.0000 mg | ORAL_TABLET | Freq: Every day | ORAL | 3 refills | Status: AC

## 2019-07-30 NOTE — Telephone Encounter (Signed)
Patient is taking this medication. Okay to fill or would you like patient to ask cardiology to manage?

## 2019-07-30 NOTE — Telephone Encounter (Signed)
Pt daughter called saying that they needed a refill on metoprolol succinate (TOPROL-XL) 25 MG 24 hr tablet

## 2019-07-30 NOTE — Telephone Encounter (Signed)
Call daughter is pt still taking metoprolol 25 mg er daily? Has cardiology changed this they sent me refill request   MTS

## 2019-08-09 ENCOUNTER — Telehealth: Payer: Self-pay | Admitting: Adult Health Nurse Practitioner

## 2019-08-12 ENCOUNTER — Telehealth: Payer: Self-pay | Admitting: Internal Medicine

## 2019-08-12 ENCOUNTER — Encounter: Payer: Self-pay | Admitting: Internal Medicine

## 2019-08-12 ENCOUNTER — Other Ambulatory Visit: Payer: Self-pay

## 2019-08-12 NOTE — Telephone Encounter (Signed)
Called and spoke with patient's daughter. She states that this visit is to establish home health. Informed her that these need to be a face to face visit.   They have been screened and cleared to come in to office

## 2019-08-12 NOTE — Progress Notes (Signed)
Error

## 2019-08-13 ENCOUNTER — Ambulatory Visit: Payer: PPO | Admitting: Internal Medicine

## 2019-08-14 ENCOUNTER — Other Ambulatory Visit: Payer: Self-pay

## 2019-08-14 ENCOUNTER — Other Ambulatory Visit: Payer: Self-pay | Admitting: Internal Medicine

## 2019-08-14 ENCOUNTER — Telehealth: Payer: Self-pay | Admitting: Internal Medicine

## 2019-08-14 ENCOUNTER — Other Ambulatory Visit: Payer: PPO

## 2019-08-14 ENCOUNTER — Other Ambulatory Visit (HOSPITAL_COMMUNITY)
Admission: RE | Admit: 2019-08-14 | Discharge: 2019-08-14 | Disposition: A | Discharge status: Home / Self Care | Payer: PPO | Source: Ambulatory Visit | Attending: Internal Medicine | Admitting: Internal Medicine

## 2019-08-14 DIAGNOSIS — N3000 Acute cystitis without hematuria: Secondary | ICD-10-CM

## 2019-08-14 DIAGNOSIS — N760 Acute vaginitis: Secondary | ICD-10-CM

## 2019-08-14 NOTE — Telephone Encounter (Signed)
Urine orders in  We did not talk about doing a urine but orders in  Is urine still good if dropped off yesterday

## 2019-08-14 NOTE — Telephone Encounter (Signed)
Pt's daughter has a question about insulin (does she need to eat and take it?) and also wants urine results.

## 2019-08-14 NOTE — Telephone Encounter (Signed)
Pt  Daughter called again wanting results

## 2019-08-14 NOTE — Telephone Encounter (Signed)
Orders placed. Is the sample still in the lab?

## 2019-08-14 NOTE — Telephone Encounter (Signed)
Patient dropped of Urine 6/15 yesterday, but do not see any orders in the system. Please advise

## 2019-08-15 ENCOUNTER — Telehealth (INDEPENDENT_AMBULATORY_CARE_PROVIDER_SITE_OTHER): Payer: Self-pay

## 2019-08-15 LAB — URINALYSIS, ROUTINE W REFLEX MICROSCOPIC
Bacteria, UA: NONE SEEN /HPF
Bilirubin Urine: NEGATIVE
Glucose, UA: NEGATIVE
Hgb urine dipstick: NEGATIVE
Hyaline Cast: NONE SEEN /LPF
Ketones, ur: NEGATIVE
Nitrite: NEGATIVE
Protein, ur: NEGATIVE
RBC / HPF: NONE SEEN /HPF (ref 0–2)
Specific Gravity, Urine: 1.015 (ref 1.001–1.03)
pH: >=8.5 — AB (ref 5.0–8.0)

## 2019-08-15 LAB — URINE CULTURE
MICRO NUMBER:: 10598489
SPECIMEN QUALITY:: ADEQUATE

## 2019-08-15 NOTE — Telephone Encounter (Signed)
Patient daughter called and I spoke with her about the concerns she is having about her mother being unstable on her legs. Patient was last seen in the office 08/23/2018.The patient daughter informed that she has using her lymphedema pump for 45 minutes but when she turned the time down her mother stated her legs felt better. The patient inform that her mother do sleep in a recliner,wears her compression wraps, but from her calf to ankle she has been having a little of purplish color for several months. The patient has seen PCP about what's going on and wrote Rx for knee brace to help with knee. The patient has seen the wound center several months ago and advise the patient that the swelling was coming from circulation. A month ago the patient was having right foot pain and was evaluated by the PCP. I spoke with Dr Delana Meyer and he recommend for the patient to come in for bilateral venous dvt,abi and see himself or Arna Medici NP. Patient daughter has being made aware and has schedule an appointment for her mother.

## 2019-08-15 NOTE — Telephone Encounter (Signed)
Pt returned your call.  

## 2019-08-15 NOTE — Telephone Encounter (Signed)
We sent it off for testing. Urine usually good up to 24 hours.

## 2019-08-16 NOTE — Telephone Encounter (Signed)
Nino Glow McLean-Scocuzza, MD  08/16/2019  8:03 AM EDT Back to Top    Urine culture normal Other pending   Patient's daughter informed and verbalized understanding.

## 2019-08-19 LAB — URINE CYTOLOGY ANCILLARY ONLY
Bacterial Vaginitis-Urine: NEGATIVE
Candida Urine: NEGATIVE

## 2019-08-22 ENCOUNTER — Telehealth: Payer: Self-pay | Admitting: Internal Medicine

## 2019-08-22 NOTE — Telephone Encounter (Signed)
Returned call to patient's daughter and gave her the information below.   [place] [facility] Restaurant manager, fast food at [phone]

## 2019-08-22 NOTE — Telephone Encounter (Signed)
Pt daughter wanted to know how she could get transportation for mother to get to doctor appointments

## 2019-08-23 ENCOUNTER — Other Ambulatory Visit: Payer: Self-pay | Admitting: Family

## 2019-08-23 ENCOUNTER — Telehealth: Payer: Self-pay | Admitting: Internal Medicine

## 2019-08-23 DIAGNOSIS — E1142 Type 2 diabetes mellitus with diabetic polyneuropathy: Secondary | ICD-10-CM

## 2019-08-23 MED ORDER — GABAPENTIN 100 MG PO CAPS: 100.0000 mg | ORAL_CAPSULE | Freq: Every day | ORAL | 0 refills | Status: DC

## 2019-08-23 NOTE — Telephone Encounter (Signed)
Call pt Sounds like neuropathy however since I dont know patient and she hasnt had recent in person visit, I would cautiously increase gabapentin by 100mg . She may take 300mg  BID and 100mg  dose in middle of day to see if helpful. We can increase from there  She may also try capsaicin which is a topical OTC cream which is helpful for neuropathy, I would advise trying this first  Please sch in person with [facility]clean as well

## 2019-08-23 NOTE — Telephone Encounter (Signed)
Please advise 

## 2019-08-23 NOTE — Telephone Encounter (Signed)
Called and informed the Patient's daughter of the below. She verbalized understanding and will pick up the cream and medication sent in.   Patient is set to see vein and vascular within the next 2 weeks and will see Dr Olivia Mackie McLean-Scocuzza 09/26/19.

## 2019-08-23 NOTE — Telephone Encounter (Signed)
Pt's daughter said that pt has been complaining that she feels like bugs are biting her on her legs and feet. Daughter wants to know should they increase her gabapentin prescription?

## 2019-08-27 ENCOUNTER — Other Ambulatory Visit: Payer: PPO | Admitting: Adult Health Nurse Practitioner

## 2019-08-27 ENCOUNTER — Telehealth: Payer: Self-pay | Admitting: Internal Medicine

## 2019-08-27 ENCOUNTER — Other Ambulatory Visit: Payer: Self-pay

## 2019-08-27 DIAGNOSIS — I509 Heart failure, unspecified: Secondary | ICD-10-CM

## 2019-08-27 DIAGNOSIS — Z515 Encounter for palliative care: Secondary | ICD-10-CM

## 2019-08-27 NOTE — Progress Notes (Signed)
[facility] Consult Note Telephone: [phone]  Fax: [phone]  PATIENT NAME: [name] DOB: 1924/10/08 MRN: [id number]  PRIMARY CARE PROVIDER:   McLean-Scocuzza, Nino Glow, MD  REFERRING PROVIDER:  McLean-Scocuzza, Nino Glow, MD [name],  [place] [address]  RESPONSIBLE PARTY:  Percell Locus, daughter [phone]       RECOMMENDATIONS and PLAN:  1.  Advanced care planning.  Patient is full code.  Discussed ACP with patient and daughters today.  Patient does want CPR attempted if needed.  Patient at 1st states that she did not want to go to the hospital.  Discussion with patient and daughters indicates that they would like anything done to help including ventilation and life support.  2.  Functional status.  Patient observed getting up from her lift chair today.  She still required standby assist by her daughter.  Patient attempted to take a few steps before becoming short of breath and tired.  Patient was not able to take full steps she will shuffle her feet and required standby assist for this.  She appeared unsteady and shaky on her feet.  Daughter states that 2 weeks ago she was able to walk to the bathroom which is between 20 and 30 feet from the recliner in which she sits/sleeps in.  Now she uses bedside commode beside her recliner.  Requires assistance with ADLs and IADLs.  Denies falls.  Daughter does state that it is hard with her current weakness to get her into the car to take to office visits.  States that she looked into a transportation agency in [place] and they don't come out as far as where they live.  Daughter did state they had to take her to an office visit recently and it was struggle with 2 of them getting her in and out of the vehicle.  As this is new onset weakness recommend physical therapy and occupational therapy through home health to help with any deconditioning over the past couple  of weeks.  Did discuss with family that this may be possible new baseline.  Reached out to PCP office with recommendation for home health PT/OT.  3.  Nutritional status.  Family/caregiver states that some days she hardly eats anything and the other days she eats all day.  She usually checks her weight daily and is stable around 187.  Has not been checking her weights daily with new onset weakness as it is hard for her to stand up on the scales at this time.  Caregiver does state that her appetite comes and goes with changes made to her insulin.  For her diabetes she is on glipizide, NovoLog sliding scale, Ozempic, and Januvia.  Her diabetes is being managed by her PCP.  Does complain of occasional constipation.  Does take MiraLAX daily and and as needed stool softener have recommended that they use the stool softener daily.  Continue follow-up and recommendations by PCP.  4.  Anxiety.  Daughter states has not been sleeping well and has been very restless.  Daughter states that she has had several family members passed away over the past few months.  Daughter states that she has been observed talking to loved ones that have passed away.  Daughter states that she has been giving lorazepam 1 mg at night and that some nights she rests well and other nights she can still be restless even with this.  Patient is on sertraline 50 mg daily.  Daughter states that she has been at this dose for several years.  Recommend possibly increasing the dose of the sertraline to see if this may help with her anxiety and restlessness.  Would continue with the lorazepam as needed to see if the increase sertraline dosage is helpful.  5.  Heart issues.  Patient has CAD, CHF, HTN, severe aortic stenosis, has had TAVR placed in 11/15/2016.  Patient is being followed by cardiology.  She is currently on metoprolol succinate 25 mg daily, torsemide 30 mg every other day with 40 mg on alternating days, she is also on aspirin 81 mg and  lovastatin 20 mg for history of CVA.  Family does state that her blood pressure will fluctuate some days being low and some days being high.  Today her vital signs are within normal limits.  Her edema is at baseline.  Weight has been stable at around 187 pounds.  Patient sleeps in her recliner.  Family does state that laying her down flat she complains of shortness of breath.  She is having increased shortness of breath with even light activity.  Denies increased shortness of breath at rest, eating, drinking, talking. Denies cough, chest pain, orthopnea, headaches, dizziness.  Continue follow up and recommendations by cardiology.  Had discussion with patient and family about disease progression and new baselines.  Family would like to work with home health therapy to see if she can regain some of her strength.  Did discuss that this may be her new baseline.  Discussed possibly starting hospice services if she continues to decline.  Palliative will continue to monitor for symptom management/decline and make recommendations as needed.  Next appointment is in 6 weeks.  I spent 90 minutes providing this consultation,  from 8:30 to 10:00  Including time spent with patient/family, chart review, provider coordination, documentation. More than 50% of the time in this consultation was spent coordinating communication.   HISTORY OF PRESENT ILLNESS:  [name] is a 86 y.o. 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 Palliative Care was asked to help address goals of care.   CODE STATUS: full code  PPS: 40% HOSPICE ELIGIBILITY/DIAGNOSIS: TBD  PHYSICAL EXAM:  BP 132/72  HR 80  O2 97% on RA General: NAD, frail appearing Cardiovascular: regular rate and rhythm Pulmonary: Lung sounds clear; normal respiratory effort Abdomen: soft, nontender, + bowel  sounds GU: no suprapubic tenderness Extremities: has compression hose in place with trace edema noted to bilateral feet, no joint deformities Skin: no rashes on exposed skin Neurological: Weakness; forgetfulness  PAST MEDICAL HISTORY:  Past Medical History:  Diagnosis Date  . Anemia   . Carotid arterial disease ([facility])    a. 08/2014 U/S: Bilateral < 50% stenosis. Patent vertebrals w/ antegrade flow.   . Cervical spine arthritis   . Chronic diastolic CHF (congestive heart failure) ([facility])    a. echo 2014: EF 55-60%, DD;  b. 08/2014 Echo: EF 55-60%, no RWMA, GR1DD; c. 02/2016 Echo: EF 65-70%, Gr1 DD.  Marland Kitchen Cognitive impairment   . Depression   . Diabetes mellitus without complication ([facility])   . Essential hypertension   . GERD (gastroesophageal reflux disease)   . Glaucoma   . Gout   . History of renal impairment   . Hyperlipidemia   . Hypotension    a. Related to Norvasc - 11/2014 ED visit.  Marland Kitchen  Multinodular goiter    a. Noted incidentally on CT 07/2012 and carotid U/S 08/2014;  b. Nl TSH 11/2014.  Marland Kitchen Paroxysmal SVT (supraventricular tachycardia) ([facility])    a. on Toprol   . Prolapsed uterus   . Severe aortic stenosis    a. 11/15/16: s/p TAVR Edwards Sapien 3 THV (size 23 mm, model # 9600TFX, serial # [id number])  . Stroke [facility])    a. CT head 07/2014 showed small thalamic infarct  . Vitamin deficiency     SOCIAL HX:  Social History   Tobacco Use  . Smoking status: Never Smoker  . Smokeless tobacco: Never Used  Substance Use Topics  . Alcohol use: No    ALLERGIES:  Allergies  Allergen Reactions  . Metformin Hcl Other (See Comments)    GI upset  . Metformin Other (See Comments), Diarrhea and Nausea And Vomiting    Other reaction(s): Other (See Comments), Unknown  . Metformin And Related Diarrhea     PERTINENT MEDICATIONS:  Outpatient Encounter Medications as of 08/27/2019  Medication Sig  . ACCU-CHEK AVIVA PLUS test strip USE 1 STRIP TO TEST BLOOD SUGARS 3 TIMES A DAY  . acetaminophen  (TYLENOL) 500 MG tablet Take 1 tablet (500 mg total) by mouth every 6 (six) hours as needed for mild pain or headache.  . allopurinol (ZYLOPRIM) 100 MG tablet Take 1 tablet (100 mg total) by mouth daily.  Marland Kitchen aspirin 81 MG chewable tablet Chew 1 tablet (81 mg total) by mouth daily.  . blood glucose meter kit and supplies Dispense based on patient and insurance preference. Use 1x daily as directed. (FOR ICD-10 E10.9, E11.9).  . brimonidine (ALPHAGAN P) 0.1 % SOLN Place 1 drop into the right eye 2 (two) times daily.  . Carboxymethylcellulose Sodium (ARTIFICIAL TEARS OP) Apply 1 drop to eye 2 (two) times daily. Left eye  . cholecalciferol (VITAMIN D) 25 MCG (1000 UT) tablet Take 1 tablet (1,000 Units total) by mouth daily.  . clotrimazole (LOTRIMIN) 1 % cream APPLY TOPICALLY TO THE AFFECTED AREA ONCE A DAY AT BEDTIME FOR 7-14 DAYS  . Continuous Blood Gluc Receiver (FREESTYLE LIBRE 14 DAY READER) DEVI 1 Device by Does not apply route every 14 (fourteen) days. (Patient not taking: Reported on 07/03/2019)  . Continuous Blood Gluc Sensor (FREESTYLE LIBRE 14 DAY SENSOR) MISC 1 Device by Does not apply route every 14 (fourteen) days. (Patient not taking: Reported on 07/03/2019)  . dimethicone (PROSHIELD PLUS SKIN PROTECTANT) 1 % cream 1 application to the buttock 2 times daily to help protect skin  . Dorzolamide HCl-Timolol Mal (COSOPT OP) Place 1 drop into the right eye 2 (two) times daily.   . fluticasone (FLONASE) 50 MCG/ACT nasal spray Place 1 spray into both nostrils daily. Prn max 2 sprays total each nose  . gabapentin (NEURONTIN) 100 MG capsule Take 1 capsule (100 mg total) by mouth daily. Take one tablet ( 100 mg) by mouth one hour before bedtime every night.  . gabapentin (NEURONTIN) 300 MG capsule Take 1 capsule (300 mg total) by mouth 2 (two) times daily.  Marland Kitchen glucose blood test strip Accu-Chek Aviva Plus test strips  . glucose blood test strip qid Use as instructed E11.9  . insulin aspart (NOVOLOG  FLEXPEN) 100 UNIT/ML FlexPen TID before meals 15-30 minutes based on sugar reading before meals If sugar: 70-130 0 units, 131-180 2 units, 181-240 4 units, 241-300 6 units, 301-350 8 units, 351-400 10 units, >400 12 units and call doctor  . insulin  degludec (TRESIBA FLEXTOUCH) 100 UNIT/ML FlexTouch Pen Inject 0.1 mLs (10 Units total) into the skin daily.  . Insulin Pen Needle (PEN NEEDLES) 30G X 8 MM MISC 1 Device by Does not apply route once a week.  . Insulin Pen Needle (PEN NEEDLES) 30G X 8 MM MISC 1 Device by Does not apply route 3 (three) times daily before meals.  . Insulin Pen Needle (PEN NEEDLES) 31G X 8 MM MISC 1 pen by Does not apply route daily.  Elmore Guise Device MISC 1 Device by Does not apply route as directed.  . Lancets (ONETOUCH ULTRASOFT) lancets Bid E 11.9 Use as instructed  . latanoprost (XALATAN) 0.005 % ophthalmic solution Place 1 drop into the right eye at bedtime.   Marland Kitchen LORazepam (ATIVAN) 1 MG tablet Take 1 tablet (1 mg total) by mouth at bedtime as needed for anxiety.  . lovastatin (MEVACOR) 20 MG tablet Take 1 tablet (20 mg total) by mouth at bedtime. Note reduced dose due to age and chronic medical issues d/c 40 mg dose  . metoprolol succinate (TOPROL-XL) 25 MG 24 hr tablet Take 1 tablet (25 mg total) by mouth daily.  . metroNIDAZOLE (METROGEL) 0.75 % vaginal gel Place 1 Applicatorful vaginally 2 (two) times daily. Prn BV  . Multiple Vitamin (MULTI-VITAMIN PO) Take 1 tablet by mouth daily.  . mupirocin ointment (BACTROBAN) 2 % Apply 1 application topically 2 (two) times daily. To open sores as needed  . mupirocin ointment (BACTROBAN) 2 % Apply topically 2 (two) times daily.  Glory Rosebush DELICA LANCETS 09B MISC USE TO TEST BLOOD SUGAR ONCE A DAY  . pantoprazole (PROTONIX) 40 MG tablet Take 1 tablet (40 mg total) by mouth daily. 30 minutes before breakfast OR dinner  . pilocarpine (PILOCAR) 4 % ophthalmic solution Place 1 drop into the right eye 4 (four) times daily.   .  polyethylene glycol (MIRALAX) packet Take 17 g by mouth daily. prn  . Polysacch Fe Complex-Vit D3 (NOVAFERRUM 125) 125-100 MG-UNT/5ML LIQD Take 125 mg by mouth daily.  . Potassium Chloride ER 20 MEQ TBCR TAKE 1 TABLET BY MOUTH EVERY DAY WITH FLUID PILL  . Semaglutide,0.25 or 0.5MG/DOS, (OZEMPIC, 0.25 OR 0.5 MG/DOSE,) 2 MG/1.5ML SOPN Inject 0.25 mg into the skin once a week.  . sertraline (ZOLOFT) 50 MG tablet Take 1 tablet (50 mg total) by mouth daily.  . sitaGLIPtin (JANUVIA) 25 MG tablet Take 1 tablet (25 mg total) by mouth daily. D/c 50 mg d/c glipizide 2.5 mg daily  . torsemide (DEMADEX) 20 MG tablet Take 30MG by mouth every other day alternating with 40MG by mouth on alternating days.  . traZODone (DESYREL) 100 MG tablet Take 0.5-1 tablets (50-100 mg total) by mouth at bedtime as needed.  . triamcinolone cream (KENALOG) 0.1 % Apply 1 application topically 2 (two) times daily. Prn legs  . vitamin C (ASCORBIC ACID) 500 MG tablet Take 1 tablet (500 mg total) by mouth daily.  . Zinc Oxide 10 % OINT Apply 1 application topically daily as needed (rash). Applied to patient's bottom   No facility-administered encounter medications on file as of 08/27/2019.     Willisha Sligar Jenetta Downer, NP

## 2019-08-27 NOTE — Progress Notes (Signed)
  [facility] Consult Note Telephone: [phone]  Fax: [phone]  PATIENT NAME: [name] DOB: 08/03/1924 MRN: [id number]  PRIMARY CARE PROVIDER:   McLean-Scocuzza, Tracy N, MD  REFERRING PROVIDER:  McLean-Scocuzza, Tracy N, MD [address] [place],  [place] [address]  RESPONSIBLE PARTY:  [name] Harris, daughter [phone]       RECOMMENDATIONS and PLAN:  1.  Advanced care planning.  Patient is full code.  Discussed ACP with patient and daughters today.  Patient does want CPR attempted if needed.  Patient at 1st states that she did not want to go to the hospital.  Discussion with patient and daughters indicates that they would like anything done to help including ventilation and life support.  2.  Functional status.  Patient observed getting up from her lift chair today.  She still required standby assist by her daughter.  Patient attempted to take a few steps before becoming short of breath and tired.  Patient was not able to take full steps she will shuffle her feet and required standby assist for this.  She appeared unsteady and shaky on her feet.  Daughter states that 2 weeks ago she was able to walk to the bathroom which is between 20 and 30 feet from the recliner in which she sits/sleeps in.  Now she uses bedside commode beside her recliner.  Requires assistance with ADLs and IADLs.  Denies falls.  Daughter does state that it is hard with her current weakness to get her into the car to take to office visits.  States that she looked into a transportation agency in [place] and they don't come out as far as where they live.  Daughter did state they had to take her to an office visit recently and it was struggle with 2 of them getting her in and out of the vehicle.  As this is new onset weakness recommend physical therapy and occupational therapy through home health to help with any deconditioning over the past couple  of weeks.  Did discuss with family that this may be possible new baseline.  Reached out to PCP office with recommendation for home health PT/OT.  3.  Nutritional status.  Family/caregiver states that some days she hardly eats anything and the other days she eats all day.  She usually checks her weight daily and is stable around 187.  Has not been checking her weights daily with new onset weakness as it is hard for her to stand up on the scales at this time.  Caregiver does state that her appetite comes and goes with changes made to her insulin.  For her diabetes she is on glipizide, NovoLog sliding scale, Ozempic, and Januvia.  Her diabetes is being managed by her PCP.  Does complain of occasional constipation.  Does take MiraLAX daily and and as needed stool softener have recommended that they use the stool softener daily.  Continue follow-up and recommendations by PCP.  4.  Anxiety.  Daughter states has not been sleeping well and has been very restless.  Daughter states that she has had several family members passed away over the past few months.  Daughter states that she has been observed talking to loved ones that have passed away.  Daughter states that she has been giving lorazepam 1 mg at night and that some nights she rests well and other nights she can still be restless even with this.  Patient is on sertraline 50 mg daily.    Daughter states that she has been at this dose for several years.  Recommend possibly increasing the dose of the sertraline to see if this may help with her anxiety and restlessness.  Would continue with the lorazepam as needed to see if the increase sertraline dosage is helpful.  5.  Heart issues.  Patient has CAD, CHF, HTN, severe aortic stenosis, has had TAVR placed in 11/15/2016.  Patient is being followed by cardiology.  She is currently on metoprolol succinate 25 mg daily, torsemide 30 mg every other day with 40 mg on alternating days, she is also on aspirin 81 mg and  lovastatin 20 mg for history of CVA.  Family does state that her blood pressure will fluctuate some days being low and some days being high.  Today her vital signs are within normal limits.  Her edema is at baseline.  Weight has been stable at around 187 pounds.  Patient sleeps in her recliner.  Family does state that laying her down flat she complains of shortness of breath.  She is having increased shortness of breath with even light activity.  Denies increased shortness of breath at rest, eating, drinking, talking. Denies cough, chest pain, orthopnea, headaches, dizziness.  Continue follow up and recommendations by cardiology.  Had discussion with patient and family about disease progression and new baselines.  Family would like to work with home health therapy to see if she can regain some of her strength.  Did discuss that this may be her new baseline.  Discussed possibly starting hospice services if she continues to decline.  Palliative will continue to monitor for symptom management/decline and make recommendations as needed.  Next appointment is in 6 weeks.  I spent 90 minutes providing this consultation,  from 8:30 to 10:00  Including time spent with patient/family, chart review, provider coordination, documentation. More than 50% of the time in this consultation was spent coordinating communication.   HISTORY OF PRESENT ILLNESS:  [name] is a 86 y.o. 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 Palliative Care was asked to help address goals of care.   CODE STATUS: full code  PPS: 40% HOSPICE ELIGIBILITY/DIAGNOSIS: TBD  PHYSICAL EXAM:  BP 132/72  HR 80  O2 97% on RA General: NAD, frail appearing Cardiovascular: regular rate and rhythm Pulmonary: Lung sounds clear; normal respiratory effort Abdomen: soft, nontender, + bowel  sounds GU: no suprapubic tenderness Extremities: has compression hose in place with trace edema noted to bilateral feet, no joint deformities Skin: no rashes on exposed skin Neurological: Weakness; forgetfulness  PAST MEDICAL HISTORY:  Past Medical History:  Diagnosis Date  . Anemia   . Carotid arterial disease (HCC)    a. 08/2014 U/S: Bilateral < 50% stenosis. Patent vertebrals w/ antegrade flow.   . Cervical spine arthritis   . Chronic diastolic CHF (congestive heart failure) (HCC)    a. echo 2014: EF 55-60%, DD;  b. 08/2014 Echo: EF 55-60%, no RWMA, GR1DD; c. 02/2016 Echo: EF 65-70%, Gr1 DD.  . Cognitive impairment   . Depression   . Diabetes mellitus without complication (HCC)   . Essential hypertension   . GERD (gastroesophageal reflux disease)   . Glaucoma   . Gout   . History of renal impairment   . Hyperlipidemia   . Hypotension    a. Related to Norvasc - 11/2014 ED visit.  .   Multinodular goiter    a. Noted incidentally on CT 07/2012 and carotid U/S 08/2014;  b. Nl TSH 11/2014.  . Paroxysmal SVT (supraventricular tachycardia) (HCC)    a. on Toprol   . Prolapsed uterus   . Severe aortic stenosis    a. 11/15/16: s/p TAVR Edwards Sapien 3 THV (size 23 mm, model # 9600TFX, serial # [id number])  . Stroke (HCC)    a. CT head 07/2014 showed small thalamic infarct  . Vitamin deficiency     SOCIAL HX:  Social History   Tobacco Use  . Smoking status: Never Smoker  . Smokeless tobacco: Never Used  Substance Use Topics  . Alcohol use: No    ALLERGIES:  Allergies  Allergen Reactions  . Metformin Hcl Other (See Comments)    GI upset  . Metformin Other (See Comments), Diarrhea and Nausea And Vomiting    Other reaction(s): Other (See Comments), Unknown  . Metformin And Related Diarrhea     PERTINENT MEDICATIONS:  Outpatient Encounter Medications as of 08/27/2019  Medication Sig  . ACCU-CHEK AVIVA PLUS test strip USE 1 STRIP TO TEST BLOOD SUGARS 3 TIMES A DAY  . acetaminophen  (TYLENOL) 500 MG tablet Take 1 tablet (500 mg total) by mouth every 6 (six) hours as needed for mild pain or headache.  . allopurinol (ZYLOPRIM) 100 MG tablet Take 1 tablet (100 mg total) by mouth daily.  . aspirin 81 MG chewable tablet Chew 1 tablet (81 mg total) by mouth daily.  . blood glucose meter kit and supplies Dispense based on patient and insurance preference. Use 1x daily as directed. (FOR ICD-10 E10.9, E11.9).  . brimonidine (ALPHAGAN P) 0.1 % SOLN Place 1 drop into the right eye 2 (two) times daily.  . Carboxymethylcellulose Sodium (ARTIFICIAL TEARS OP) Apply 1 drop to eye 2 (two) times daily. Left eye  . cholecalciferol (VITAMIN D) 25 MCG (1000 UT) tablet Take 1 tablet (1,000 Units total) by mouth daily.  . clotrimazole (LOTRIMIN) 1 % cream APPLY TOPICALLY TO THE AFFECTED AREA ONCE A DAY AT BEDTIME FOR 7-14 DAYS  . Continuous Blood Gluc Receiver (FREESTYLE LIBRE 14 DAY READER) DEVI 1 Device by Does not apply route every 14 (fourteen) days. (Patient not taking: Reported on 07/03/2019)  . Continuous Blood Gluc Sensor (FREESTYLE LIBRE 14 DAY SENSOR) MISC 1 Device by Does not apply route every 14 (fourteen) days. (Patient not taking: Reported on 07/03/2019)  . dimethicone (PROSHIELD PLUS SKIN PROTECTANT) 1 % cream 1 application to the buttock 2 times daily to help protect skin  . Dorzolamide HCl-Timolol Mal (COSOPT OP) Place 1 drop into the right eye 2 (two) times daily.   . fluticasone (FLONASE) 50 MCG/ACT nasal spray Place 1 spray into both nostrils daily. Prn max 2 sprays total each nose  . gabapentin (NEURONTIN) 100 MG capsule Take 1 capsule (100 mg total) by mouth daily. Take one tablet ( 100 mg) by mouth one hour before bedtime every night.  . gabapentin (NEURONTIN) 300 MG capsule Take 1 capsule (300 mg total) by mouth 2 (two) times daily.  . glucose blood test strip Accu-Chek Aviva Plus test strips  . glucose blood test strip qid Use as instructed E11.9  . insulin aspart (NOVOLOG  FLEXPEN) 100 UNIT/ML FlexPen TID before meals 15-30 minutes based on sugar reading before meals If sugar: 70-130 0 units, 131-180 2 units, 181-240 4 units, 241-300 6 units, 301-350 8 units, 351-400 10 units, >400 12 units and call doctor  . insulin   degludec (TRESIBA FLEXTOUCH) 100 UNIT/ML FlexTouch Pen Inject 0.1 mLs (10 Units total) into the skin daily.  . Insulin Pen Needle (PEN NEEDLES) 30G X 8 MM MISC 1 Device by Does not apply route once a week.  . Insulin Pen Needle (PEN NEEDLES) 30G X 8 MM MISC 1 Device by Does not apply route 3 (three) times daily before meals.  . Insulin Pen Needle (PEN NEEDLES) 31G X 8 MM MISC 1 pen by Does not apply route daily.  . Lancet Device MISC 1 Device by Does not apply route as directed.  . Lancets (ONETOUCH ULTRASOFT) lancets Bid E 11.9 Use as instructed  . latanoprost (XALATAN) 0.005 % ophthalmic solution Place 1 drop into the right eye at bedtime.   . LORazepam (ATIVAN) 1 MG tablet Take 1 tablet (1 mg total) by mouth at bedtime as needed for anxiety.  . lovastatin (MEVACOR) 20 MG tablet Take 1 tablet (20 mg total) by mouth at bedtime. Note reduced dose due to age and chronic medical issues d/c 40 mg dose  . metoprolol succinate (TOPROL-XL) 25 MG 24 hr tablet Take 1 tablet (25 mg total) by mouth daily.  . metroNIDAZOLE (METROGEL) 0.75 % vaginal gel Place 1 Applicatorful vaginally 2 (two) times daily. Prn BV  . Multiple Vitamin (MULTI-VITAMIN PO) Take 1 tablet by mouth daily.  . mupirocin ointment (BACTROBAN) 2 % Apply 1 application topically 2 (two) times daily. To open sores as needed  . mupirocin ointment (BACTROBAN) 2 % Apply topically 2 (two) times daily.  . ONETOUCH DELICA LANCETS 33G MISC USE TO TEST BLOOD SUGAR ONCE A DAY  . pantoprazole (PROTONIX) 40 MG tablet Take 1 tablet (40 mg total) by mouth daily. 30 minutes before breakfast OR dinner  . pilocarpine (PILOCAR) 4 % ophthalmic solution Place 1 drop into the right eye 4 (four) times daily.   .  polyethylene glycol (MIRALAX) packet Take 17 g by mouth daily. prn  . Polysacch Fe Complex-Vit D3 (NOVAFERRUM 125) 125-100 MG-UNT/5ML LIQD Take 125 mg by mouth daily.  . Potassium Chloride ER 20 MEQ TBCR TAKE 1 TABLET BY MOUTH EVERY DAY WITH FLUID PILL  . Semaglutide,0.25 or 0.5MG/DOS, (OZEMPIC, 0.25 OR 0.5 MG/DOSE,) 2 MG/1.5ML SOPN Inject 0.25 mg into the skin once a week.  . sertraline (ZOLOFT) 50 MG tablet Take 1 tablet (50 mg total) by mouth daily.  . sitaGLIPtin (JANUVIA) 25 MG tablet Take 1 tablet (25 mg total) by mouth daily. D/c 50 mg d/c glipizide 2.5 mg daily  . torsemide (DEMADEX) 20 MG tablet Take 30MG by mouth every other day alternating with 40MG by mouth on alternating days.  . traZODone (DESYREL) 100 MG tablet Take 0.5-1 tablets (50-100 mg total) by mouth at bedtime as needed.  . triamcinolone cream (KENALOG) 0.1 % Apply 1 application topically 2 (two) times daily. Prn legs  . vitamin C (ASCORBIC ACID) 500 MG tablet Take 1 tablet (500 mg total) by mouth daily.  . Zinc Oxide 10 % OINT Apply 1 application topically daily as needed (rash). Applied to patient's bottom   No facility-administered encounter medications on file as of 08/27/2019.     Marcheta Horsey K Linn Clavin, NP    

## 2019-08-27 NOTE — Telephone Encounter (Signed)
[name]-NP with [facility] would like for you to order patient Home Health physical therapy and occupational therapy. She said she will be sending over other recommendations. She also said pt will probably need to be seen sooner than 09/26/19.

## 2019-08-27 NOTE — Telephone Encounter (Signed)
Pt's daughter [name] called to make an appt for tomorrow afternoon. She wants to know if it can be virtual. Please call back today to advise

## 2019-08-28 ENCOUNTER — Other Ambulatory Visit: Payer: Self-pay

## 2019-08-28 ENCOUNTER — Ambulatory Visit (INDEPENDENT_AMBULATORY_CARE_PROVIDER_SITE_OTHER): Payer: PPO | Admitting: Internal Medicine

## 2019-08-28 ENCOUNTER — Encounter: Payer: Self-pay | Admitting: Internal Medicine

## 2019-08-28 VITALS — BP 116/80 | HR 76 | Temp 98.7°F | Ht 60.0 in | Wt 185.0 lb

## 2019-08-28 DIAGNOSIS — I10 Essential (primary) hypertension: Secondary | ICD-10-CM

## 2019-08-28 DIAGNOSIS — I350 Nonrheumatic aortic (valve) stenosis: Secondary | ICD-10-CM

## 2019-08-28 DIAGNOSIS — I739 Peripheral vascular disease, unspecified: Secondary | ICD-10-CM

## 2019-08-28 DIAGNOSIS — N184 Chronic kidney disease, stage 4 (severe): Secondary | ICD-10-CM

## 2019-08-28 DIAGNOSIS — E1122 Type 2 diabetes mellitus with diabetic chronic kidney disease: Secondary | ICD-10-CM

## 2019-08-28 DIAGNOSIS — E785 Hyperlipidemia, unspecified: Secondary | ICD-10-CM

## 2019-08-28 DIAGNOSIS — I509 Heart failure, unspecified: Secondary | ICD-10-CM

## 2019-08-28 DIAGNOSIS — K449 Diaphragmatic hernia without obstruction or gangrene: Secondary | ICD-10-CM

## 2019-08-28 DIAGNOSIS — R5381 Other malaise: Secondary | ICD-10-CM | POA: Diagnosis not present

## 2019-08-28 DIAGNOSIS — E1142 Type 2 diabetes mellitus with diabetic polyneuropathy: Secondary | ICD-10-CM

## 2019-08-28 DIAGNOSIS — M47812 Spondylosis without myelopathy or radiculopathy, cervical region: Secondary | ICD-10-CM

## 2019-08-28 DIAGNOSIS — I152 Hypertension secondary to endocrine disorders: Secondary | ICD-10-CM

## 2019-08-28 DIAGNOSIS — K219 Gastro-esophageal reflux disease without esophagitis: Secondary | ICD-10-CM

## 2019-08-28 DIAGNOSIS — F419 Anxiety disorder, unspecified: Secondary | ICD-10-CM

## 2019-08-28 DIAGNOSIS — M48061 Spinal stenosis, lumbar region without neurogenic claudication: Secondary | ICD-10-CM | POA: Diagnosis not present

## 2019-08-28 DIAGNOSIS — E1159 Type 2 diabetes mellitus with other circulatory complications: Secondary | ICD-10-CM

## 2019-08-28 DIAGNOSIS — I471 Supraventricular tachycardia, unspecified: Secondary | ICD-10-CM

## 2019-08-28 DIAGNOSIS — I700 Atherosclerosis of aorta: Secondary | ICD-10-CM

## 2019-08-28 DIAGNOSIS — E119 Type 2 diabetes mellitus without complications: Secondary | ICD-10-CM

## 2019-08-28 DIAGNOSIS — M47817 Spondylosis without myelopathy or radiculopathy, lumbosacral region: Secondary | ICD-10-CM

## 2019-08-28 DIAGNOSIS — R531 Weakness: Secondary | ICD-10-CM

## 2019-08-28 DIAGNOSIS — R29898 Other symptoms and signs involving the musculoskeletal system: Secondary | ICD-10-CM

## 2019-08-28 DIAGNOSIS — N814 Uterovaginal prolapse, unspecified: Secondary | ICD-10-CM

## 2019-08-28 DIAGNOSIS — I5032 Chronic diastolic (congestive) heart failure: Secondary | ICD-10-CM

## 2019-08-28 DIAGNOSIS — F32A Depression, unspecified: Secondary | ICD-10-CM

## 2019-08-28 DIAGNOSIS — N1832 Chronic kidney disease, stage 3b: Secondary | ICD-10-CM

## 2019-08-28 DIAGNOSIS — Z952 Presence of prosthetic heart valve: Secondary | ICD-10-CM

## 2019-08-28 DIAGNOSIS — I6523 Occlusion and stenosis of bilateral carotid arteries: Secondary | ICD-10-CM

## 2019-08-28 DIAGNOSIS — R4182 Altered mental status, unspecified: Secondary | ICD-10-CM

## 2019-08-28 DIAGNOSIS — F329 Major depressive disorder, single episode, unspecified: Secondary | ICD-10-CM

## 2019-08-28 DIAGNOSIS — R918 Other nonspecific abnormal finding of lung field: Secondary | ICD-10-CM

## 2019-08-28 DIAGNOSIS — I6381 Other cerebral infarction due to occlusion or stenosis of small artery: Secondary | ICD-10-CM

## 2019-08-28 NOTE — Addendum Note (Signed)
Addended by: Thressa Sheller on: 08/28/2019 03:51 PM   Modules accepted: Orders

## 2019-08-28 NOTE — Progress Notes (Signed)
Chief Complaint  Patient presents with   Referral   F/u 1. DM 2 cbg this am 118 and at times 237 on januvia 25, ozempic 0.25, tresiba 10, novolog SSI  2. C/o left leg weakness and physical deconditioning and ho lumbar spinal stenosis and DDD daughter wants H/H PT, and h/h aid for bathing  3. HTN/CHF, aortic stenosis history stable and control f/u cards upcoming 08/2019 on toprol xl 25 and torsemide 30 mg qod alt with 40 mg  Review of Systems  Constitutional: Negative for weight loss.  HENT: Negative for hearing loss.   Eyes: Negative for blurred vision.  Respiratory: Negative for shortness of breath.   Cardiovascular: Negative for chest pain.  Gastrointestinal: Negative for constipation.  Musculoskeletal: Negative for back pain and falls.  Neurological: Positive for weakness.  Psychiatric/Behavioral: Negative for depression. The patient has insomnia.    Past Medical History:  Diagnosis Date   Anemia    Carotid arterial disease ([place])    a. 08/2014 U/S: Bilateral < 50% stenosis. Patent vertebrals w/ antegrade flow.    Cervical spine arthritis    Chronic diastolic CHF (congestive heart failure) ([place])    a. echo 2014: EF 55-60%, DD;  b. 08/2014 Echo: EF 55-60%, no RWMA, GR1DD; c. 02/2016 Echo: EF 65-70%, Gr1 DD.   Cognitive impairment    Depression    Diabetes mellitus without complication ([place])    Essential hypertension    GERD (gastroesophageal reflux disease)    Glaucoma    Gout    History of renal impairment    Hyperlipidemia    Hypotension    a. Related to Norvasc - 11/2014 ED visit.   Multinodular goiter    a. Noted incidentally on CT 07/2012 and carotid U/S 08/2014;  b. Nl TSH 11/2014.   Paroxysmal SVT (supraventricular tachycardia) (HCC)    a. on Toprol    Prolapsed uterus    Severe aortic stenosis    a. 11/15/16: s/p TAVR Edwards Sapien 3 THV (size 23 mm, model # 9600TFX, serial # [id number])   Stroke ([place])    a. CT head 07/2014 showed small thalamic  infarct   Vitamin deficiency    Past Surgical History:  Procedure Laterality Date   EYE SURGERY     unsure of what procedure, did know that laser was used   IR RADIOLOGY PERIPHERAL GUIDED IV START  11/03/2016   IR US GUIDE VASC ACCESS RIGHT  11/03/2016   RIGHT/LEFT HEART CATH AND CORONARY ANGIOGRAPHY N/A 10/26/2016   Procedure: RIGHT/LEFT HEART CATH AND CORONARY ANGIOGRAPHY;  Surgeon: [name], MD;  Location: [facility];  Service: Cardiovascular;  Laterality: N/A;   TEE WITHOUT CARDIOVERSION N/A 03/31/2016   Procedure: TRANSESOPHAGEAL ECHOCARDIOGRAM (TEE);  Surgeon: Minna Merritts, MD;  Location: [facility];  Service: Cardiovascular;  Laterality: N/A;   TEE WITHOUT CARDIOVERSION N/A 11/15/2016   Procedure: TRANSESOPHAGEAL ECHOCARDIOGRAM (TEE);  Surgeon: [name], MD;  Location: [facility];  Service: Open Heart Surgery;  Laterality: N/A;   TRANSCATHETER AORTIC VALVE REPLACEMENT, TRANSFEMORAL N/A 11/15/2016   Procedure: TRANSCATHETER AORTIC VALVE REPLACEMENT, TRANSFEMORAL;  Surgeon: [name], MD;  Location: [facility];  Service: Open Heart Surgery;  Laterality: N/A;   TUBAL LIGATION     Family History  Problem Relation Age of Onset   Glaucoma Mother    Diabetes Mother    Peptic Ulcer Father    Colitis Father    Social History   Socioeconomic History   Marital status: Widowed  Spouse name: Not on file   Number of children: Not on file   Years of education: Not on file   Highest education level: Not on file  Occupational History   Occupation: retired  Tobacco Use   Smoking status: Never Smoker   Smokeless tobacco: Never Used  Scientific laboratory technician Use: Never used  Substance and Sexual Activity   Alcohol use: No   Drug use: No   Sexual activity: Not Currently  Other Topics Concern   Not on file  Social History Narrative   Lives at home    11 kids    Social Determinants of Health   Financial Resource Strain:    Difficulty of Paying  Living Expenses:   Food Insecurity:    Worried About Charity fundraiser in the Last Year:    Arboriculturist in the Last Year:   Transportation Needs:    Film/video editor (Medical):    Lack of Transportation (Non-Medical):   Physical Activity:    Days of Exercise per Week:    Minutes of Exercise per Session:   Stress:    Feeling of Stress :   Social Connections:    Frequency of Communication with Friends and Family:    Frequency of Social Gatherings with Friends and Family:    Attends Religious Services:    Active Member of Clubs or Organizations:    Attends Music therapist:    Marital Status:   Intimate Partner Violence:    Fear of Current or Ex-Partner:    Emotionally Abused:    Physically Abused:    Sexually Abused:    Current Meds  Medication Sig   ACCU-CHEK AVIVA PLUS test strip USE 1 STRIP TO TEST BLOOD SUGARS 3 TIMES A DAY   acetaminophen (TYLENOL) 500 MG tablet Take 1 tablet (500 mg total) by mouth every 6 (six) hours as needed for mild pain or headache.   allopurinol (ZYLOPRIM) 100 MG tablet Take 1 tablet (100 mg total) by mouth daily.   aspirin 81 MG chewable tablet Chew 1 tablet (81 mg total) by mouth daily.   blood glucose meter kit and supplies Dispense based on patient and insurance preference. Use 1x daily as directed. (FOR ICD-10 E10.9, E11.9).   brimonidine (ALPHAGAN P) 0.1 % SOLN Place 1 drop into the right eye 2 (two) times daily.   Carboxymethylcellulose Sodium (ARTIFICIAL TEARS OP) Apply 1 drop to eye 2 (two) times daily. Left eye   cholecalciferol (VITAMIN D) 25 MCG (1000 UT) tablet Take 1 tablet (1,000 Units total) by mouth daily.   clotrimazole (LOTRIMIN) 1 % cream APPLY TOPICALLY TO THE AFFECTED AREA ONCE A DAY AT BEDTIME FOR 7-14 DAYS   Continuous Blood Gluc Receiver (FREESTYLE LIBRE 14 DAY READER) DEVI 1 Device by Does not apply route every 14 (fourteen) days.   Continuous Blood Gluc Sensor (FREESTYLE  LIBRE 14 DAY SENSOR) MISC 1 Device by Does not apply route every 14 (fourteen) days.   dimethicone (PROSHIELD PLUS SKIN PROTECTANT) 1 % cream 1 application to the buttock 2 times daily to help protect skin   Dorzolamide HCl-Timolol Mal (COSOPT OP) Place 1 drop into the right eye 2 (two) times daily.    fluticasone (FLONASE) 50 MCG/ACT nasal spray Place 1 spray into both nostrils daily. Prn max 2 sprays total each nose   gabapentin (NEURONTIN) 100 MG capsule Take 1 capsule (100 mg total) by mouth daily. Take one tablet ( 100 mg) by  mouth one hour before bedtime every night.   gabapentin (NEURONTIN) 300 MG capsule Take 1 capsule (300 mg total) by mouth 2 (two) times daily.   glucose blood test strip Accu-Chek Aviva Plus test strips   glucose blood test strip qid Use as instructed E11.9   insulin aspart (NOVOLOG FLEXPEN) 100 UNIT/ML FlexPen TID before meals 15-30 minutes based on sugar reading before meals If sugar: 70-130 0 units, 131-180 2 units, 181-240 4 units, 241-300 6 units, 301-350 8 units, 351-400 10 units, >400 12 units and call doctor   insulin degludec (TRESIBA FLEXTOUCH) 100 UNIT/ML FlexTouch Pen Inject 0.1 mLs (10 Units total) into the skin daily.   Insulin Pen Needle (PEN NEEDLES) 30G X 8 MM MISC 1 Device by Does not apply route once a week.   Insulin Pen Needle (PEN NEEDLES) 30G X 8 MM MISC 1 Device by Does not apply route 3 (three) times daily before meals.   Insulin Pen Needle (PEN NEEDLES) 31G X 8 MM MISC 1 pen by Does not apply route daily.   Lancet Device MISC 1 Device by Does not apply route as directed.   Lancets (ONETOUCH ULTRASOFT) lancets Bid E 11.9 Use as instructed   latanoprost (XALATAN) 0.005 % ophthalmic solution Place 1 drop into the right eye at bedtime.    LORazepam (ATIVAN) 1 MG tablet Take 1 tablet (1 mg total) by mouth at bedtime as needed for anxiety.   lovastatin (MEVACOR) 20 MG tablet Take 1 tablet (20 mg total) by mouth at bedtime. Note reduced  dose due to age and chronic medical issues d/c 40 mg dose   metoprolol succinate (TOPROL-XL) 25 MG 24 hr tablet Take 1 tablet (25 mg total) by mouth daily.   metroNIDAZOLE (METROGEL) 0.75 % vaginal gel Place 1 Applicatorful vaginally 2 (two) times daily. Prn BV   Multiple Vitamin (MULTI-VITAMIN PO) Take 1 tablet by mouth daily.   mupirocin ointment (BACTROBAN) 2 % Apply 1 application topically 2 (two) times daily. To open sores as needed   mupirocin ointment (BACTROBAN) 2 % Apply topically 2 (two) times daily.   ONETOUCH DELICA LANCETS 13Y MISC USE TO TEST BLOOD SUGAR ONCE A DAY   pantoprazole (PROTONIX) 40 MG tablet Take 1 tablet (40 mg total) by mouth daily. 30 minutes before breakfast OR dinner   pilocarpine (PILOCAR) 4 % ophthalmic solution Place 1 drop into the right eye 4 (four) times daily.    polyethylene glycol (MIRALAX) packet Take 17 g by mouth daily. prn   Polysacch Fe Complex-Vit D3 (NOVAFERRUM 125) 125-100 MG-UNT/5ML LIQD Take 125 mg by mouth daily.   Potassium Chloride ER 20 MEQ TBCR TAKE 1 TABLET BY MOUTH EVERY DAY WITH FLUID PILL   Semaglutide,0.25 or 0.5MG/DOS, (OZEMPIC, 0.25 OR 0.5 MG/DOSE,) 2 MG/1.5ML SOPN Inject 0.25 mg into the skin once a week.   sertraline (ZOLOFT) 50 MG tablet Take 1 tablet (50 mg total) by mouth daily.   sitaGLIPtin (JANUVIA) 25 MG tablet Take 1 tablet (25 mg total) by mouth daily. D/c 50 mg d/c glipizide 2.5 mg daily   torsemide (DEMADEX) 20 MG tablet Take 30MG by mouth every other day alternating with 40MG by mouth on alternating days.   traZODone (DESYREL) 100 MG tablet Take 0.5-1 tablets (50-100 mg total) by mouth at bedtime as needed.   triamcinolone cream (KENALOG) 0.1 % Apply 1 application topically 2 (two) times daily. Prn legs   vitamin C (ASCORBIC ACID) 500 MG tablet Take 1 tablet (500 mg total)  by mouth daily.   Zinc Oxide 10 % OINT Apply 1 application topically daily as needed (rash). Applied to patient's bottom    Allergies  Allergen Reactions   Metformin Hcl Other (See Comments)    GI upset   Metformin Other (See Comments), Diarrhea and Nausea And Vomiting    Other reaction(s): Other (See Comments), Unknown   Metformin And Related Diarrhea   Recent Results (from the past 2160 hour(s))  HM DIABETES EYE EXAM     Status: None   Collection Time: 06/25/19 12:00 AM  Result Value Ref Range   HM Diabetic Eye Exam No Retinopathy No Retinopathy    Comment: AE 06/26/19   Hemoglobin A1c     Status: Abnormal   Collection Time: 07/16/19  9:12 AM  Result Value Ref Range   Hgb A1c MFr Bld 7.2 (H) 4.6 - 6.5 %    Comment: Glycemic Control Guidelines for People with Diabetes:Non Diabetic:  <6%Goal of Therapy: <7%Additional Action Suggested:  >8%   CBC w/Diff     Status: Abnormal   Collection Time: 07/16/19  9:12 AM  Result Value Ref Range   WBC 9.8 4.0 - 10.5 K/uL   RBC 3.61 (L) 3.87 - 5.11 Mil/uL   Hemoglobin 11.5 (L) 12.0 - 15.0 g/dL   HCT 34.6 (L) 36 - 46 %   MCV 95.8 78.0 - 100.0 fl   MCHC 33.2 30.0 - 36.0 g/dL   RDW 15.3 11.5 - 15.5 %   Platelets 195.0 150 - 400 K/uL   Neutrophils Relative % 77.2 (H) 43 - 77 %   Lymphocytes Relative 11.1 (L) 12 - 46 %   Monocytes Relative 6.6 3 - 12 %   Eosinophils Relative 4.2 0 - 5 %   Basophils Relative 0.9 0 - 3 %   Neutro Abs 7.6 1.4 - 7.7 K/uL   Lymphs Abs 1.1 0.7 - 4.0 K/uL   Monocytes Absolute 0.7 0 - 1 K/uL   Eosinophils Absolute 0.4 0 - 0 K/uL   Basophils Absolute 0.1 0 - 0 K/uL  Lipid panel     Status: Abnormal   Collection Time: 07/16/19  9:12 AM  Result Value Ref Range   Cholesterol 167 0 - 200 mg/dL    Comment: ATP III Classification       Desirable:  < 200 mg/dL               Borderline High:  200 - 239 mg/dL          High:  > = 240 mg/dL   Triglycerides 172.0 (H) 0 - 149 mg/dL    Comment: Normal:  <150 mg/dLBorderline High:  150 - 199 mg/dL   HDL 36.40 (L) >39.00 mg/dL   VLDL 34.4 0.0 - 40.0 mg/dL   LDL Cholesterol 96 0 - 99 mg/dL    Total CHOL/HDL Ratio 5     Comment:                Men          Women1/2 Average Risk     3.4          3.3Average Risk          5.0          4.42X Average Risk          9.6          7.13X Average Risk          15.0  11.0                       NonHDL 130.84     Comment: NOTE:  Non-HDL goal should be 30 mg/dL higher than patient's LDL goal (i.e. LDL goal of < 70 mg/dL, would have non-HDL goal of < 100 mg/dL)  Comprehensive metabolic panel     Status: Abnormal   Collection Time: 07/16/19  9:12 AM  Result Value Ref Range   Sodium 139 135 - 145 mEq/L   Potassium 4.0 3.5 - 5.1 mEq/L   Chloride 98 96 - 112 mEq/L   CO2 36 (H) 19 - 32 mEq/L   Glucose, Bld 126 (H) 70 - 99 mg/dL   BUN 14 6 - 23 mg/dL   Creatinine, Ser 1.39 (H) 0.40 - 1.20 mg/dL   Total Bilirubin 0.4 0.2 - 1.2 mg/dL   Alkaline Phosphatase 56 39 - 117 U/L   AST 22 0 - 37 U/L   ALT 12 0 - 35 U/L   Total Protein 7.7 6.0 - 8.3 g/dL   Albumin 3.8 3.5 - 5.2 g/dL   GFR 42.84 (L) >60.00 mL/min   Calcium 9.7 8.4 - 10.5 mg/dL  Urine Culture     Status: None   Collection Time: 08/14/19  4:58 PM   Specimen: Urine  Result Value Ref Range   MICRO NUMBER: [id number]    SPECIMEN QUALITY: Adequate    Sample Source URINE, CLEAN CATCH    STATUS: FINAL    ISOLATE 1:      Growth of mixed flora was isolated, suggesting probable contamination. No further testing will be performed. If clinically indicated, recollection using a method to minimize contamination, with prompt transfer to Urine Culture Transport Tube, is  recommended.   Urinalysis, Routine w reflex microscopic     Status: Abnormal   Collection Time: 08/14/19  4:58 PM  Result Value Ref Range   Color, Urine YELLOW YELLOW   APPearance CLEAR CLEAR   Specific Gravity, Urine 1.015 1.001 - 1.03   pH > OR = 8.5 (A) 5.0 - 8.0   Glucose, UA NEGATIVE NEGATIVE   Bilirubin Urine NEGATIVE NEGATIVE   Ketones, ur NEGATIVE NEGATIVE   Hgb urine dipstick NEGATIVE NEGATIVE   Protein, ur  NEGATIVE NEGATIVE   Nitrite NEGATIVE NEGATIVE   Leukocytes,Ua 1+ (A) NEGATIVE   WBC, UA 0-5 0 - 5 /HPF   RBC / HPF NONE SEEN 0 - 2 /HPF   Squamous Epithelial / LPF 0-5 < OR = 5 /HPF   Bacteria, UA NONE SEEN NONE SEEN /HPF   Hyaline Cast NONE SEEN NONE SEEN /LPF  Urine cytology ancillary only([place])     Status: None   Collection Time: 08/14/19  4:58 PM  Result Value Ref Range   Bacterial Vaginitis-Urine Negative    Candida Urine Negative    Molecular Comment      This specimen does not meet the strict criteria set by the FDA. The   Molecular Comment      result interpretation should be considered in conjunction with the   Molecular Comment patient's clinical history.    Objective  Body mass index is 36.13 kg/m. Wt Readings from Last 3 Encounters:  08/28/19 185 lb (83.9 kg)  07/03/19 187 lb 6 oz (85 kg)  06/13/19 186 lb (84.4 kg)   Temp Readings from Last 3 Encounters:  08/28/19 98.7 F (37.1 C) (Oral)  06/03/19 98.6 F (37 C)  12/06/18 98.1 F (  36.7 C) (Oral)   BP Readings from Last 3 Encounters:  08/28/19 116/80  07/03/19 134/71  06/13/19 125/82   Pulse Readings from Last 3 Encounters:  08/28/19 76  07/03/19 74  06/13/19 75    Physical Exam Vitals and nursing note reviewed.  Constitutional:      Appearance: Normal appearance. She is well-developed.  HENT:     Head: Normocephalic and atraumatic.  Eyes:     Conjunctiva/sclera: Conjunctivae normal.     Pupils: Pupils are equal, round, and reactive to light.  Cardiovascular:     Rate and Rhythm: Normal rate and regular rhythm.     Heart sounds: Normal heart sounds. No murmur heard.   Pulmonary:     Effort: Pulmonary effort is normal.     Breath sounds: Normal breath sounds.  Musculoskeletal:     Right lower leg: 2+ Pitting Edema present.     Left lower leg: 2+ Pitting Edema present.  Skin:    General: Skin is warm and dry.  Neurological:     General: No focal deficit present.     Mental Status:  She is alert and oriented to person, place, and time. Mental status is at baseline.     Gait: Gait normal.  Psychiatric:        Attention and Perception: Attention and perception normal.        Mood and Affect: Mood and affect normal.        Speech: Speech normal.        Behavior: Behavior normal. Behavior is cooperative.        Thought Content: Thought content normal.        Cognition and Memory: Cognition and memory normal.        Judgment: Judgment normal.     Assessment  Plan  Physical deconditioning with leg weakness left >right- Plan: Ambulatory referral to [facility]  Spinal stenosis of lumbar region, unspecified whether neurogenic claudication present - Plan: Ambulatory referral to [facility]  Type 2 diabetes mellitus with stage 4 chronic kidney disease, without long-term current use of insulin ([place]) - Plan: Ambulatory referral to [facility]  Hypertension associated with diabetes ([place]) - Plan: Ambulatory referral to [facility]  Hyperlipidemia, unspecified hyperlipidemia type - Plan: Ambulatory referral to Home Health  Severe aortic stenosis - Plan: Ambulatory referral to Home Health Congestive heart failure, unspecified HF chronicity, unspecified heart failure type ([place]) - Plan: Ambulatory referral to [facility]  Cont meds  F/u cards 08/2019   Stage 3b chronic kidney disease - Plan: Ambulatory referral to [facility]  HM Flu shotutd utd pna 23 consder Tdap in future  Rx shingrixin the past covid 2/2 had  prevnar utd mmr immune  Pap 05/07/12 negative  Mammogram 04/27/16 negative  DEXA 12/18/06 osteopeniavitamin D 80.01 01/04/18 Colonoscopy 03/04/97 hyperplastic polyps no f/u Provider: Dr. Olivia Mackie McLean-Scocuzza-Internal Medicine

## 2019-08-28 NOTE — Telephone Encounter (Signed)
Patient's daughter called back in earlier today in regards to the question below. Was informed that if the visit was to initiate home health this needs to be face to face.

## 2019-08-28 NOTE — Patient Instructions (Signed)
Spinal Stenosis  Spinal stenosis occurs when the open space (spinal canal) between the bones of your spine (vertebrae) narrows, putting pressure on the spinal cord or nerves. What are the causes? This condition is caused by areas of bone pushing into the central canals of your vertebrae. This condition may be present at birth (congenital), or it may be caused by:  Arthritic deterioration of your vertebrae (spinal degeneration). This usually starts around age 50.  Injury or trauma to the spine.  Tumors in the spine.  Calcium deposits in the spine. What are the signs or symptoms? Symptoms of this condition include:  Pain in the neck or back that is generally worse with activities, particularly when standing and walking.  Numbness, tingling, hot or cold sensations, weakness, or weariness in your legs.  Pain going up and down the leg (sciatica).  Frequent episodes of falling.  A foot-slapping gait that leads to muscle weakness. In more serious cases, you may develop:  Problemspassing stool or passing urine.  Difficulty having sex.  Loss of feeling in part or all of your leg. Symptoms may come on slowly and get worse over time. How is this diagnosed? This condition is diagnosed based on your medical history and a physical exam. Tests will also be done, such as:  MRI.  CT scan.  X-ray. How is this treated? Treatment for this condition often focuses on managing your pain and any other symptoms. Treatment may include:  Practicing good posture to lessen pressure on your nerves.  Exercising to strengthen muscles, build endurance, improve balance, and maintain good joint movement (range of motion).  Losing weight, if needed.  Taking medicines to reduce swelling, inflammation, or pain.  Assistive devices, such as a corset or brace. In some cases, surgery may be needed. The most common procedure is decompression laminectomy. This is done to remove excess bone that puts  pressure on your nerve roots. Follow these instructions at home: Managing pain, stiffness, and swelling  Do all exercises and stretches as told by your health care provider.  Practice good posture. If you were given a brace or a corset, wear it as told by your health care provider.  Do not do any activities that cause pain. Ask your health care provider what activities are safe for you.  Do not lift anything that is heavier than 10 lb (4.5 kg) or the limit that your health care provider tells you.  Maintain a healthy weight. Talk with your health care provider if you need help losing weight.  If directed, apply heat to the affected area as often as told by your health care provider. Use the heat source that your health care provider recommends, such as a moist heat pack or a heating pad. ? Place a towel between your skin and the heat source. ? Leave the heat on for 20-30 minutes. ? Remove the heat if your skin turns bright red. This is especially important if you are not able to feel pain, heat, or cold. You may have a greater risk of getting burned. General instructions  Take over-the-counter and prescription medicines only as told by your health care provider.  Do not use any products that contain nicotine or tobacco, such as cigarettes and e-cigarettes. If you need help quitting, ask your health care provider.  Eat a healthy diet. This includes plenty of fruits and vegetables, whole grains, and low-fat (lean) protein.  Keep all follow-up visits as told by your health care provider. This is important. Contact   a health care provider if:  Your symptoms do not get better or they get worse.  You have a fever. Get help right away if:  You have new or worse pain in your neck or upper back.  You have severe pain that cannot be controlled with medicines.  You are dizzy.  You have vision problems, blurred vision, or double vision.  You have a severe headache that is worse when you  stand.  You have nausea or you vomit.  You develop new or worse numbness or tingling in your back or legs.  You have pain, redness, swelling, or warmth in your arm or leg. Summary  Spinal stenosis occurs when the open space (spinal canal) between the bones of your spine (vertebrae) narrows. This narrowing puts pressure on the spinal cord or nerves.  Spinal stenosis can cause numbness, weakness, or pain in the neck, back, and legs.  This condition may be caused by a birth defect, arthritic deterioration of your vertebrae, injury, tumors, or calcium deposits.  This condition is usually diagnosed with MRIs, CT scans, and X-rays. This information is not intended to replace advice given to you by your health care provider. Make sure you discuss any questions you have with your health care provider. Document Revised: 01/27/2017 Document Reviewed: 01/20/2016 Elsevier Patient Education  2020 [employer]  

## 2019-08-31 DIAGNOSIS — E1122 Type 2 diabetes mellitus with diabetic chronic kidney disease: Secondary | ICD-10-CM | POA: Diagnosis not present

## 2019-08-31 DIAGNOSIS — I6523 Occlusion and stenosis of bilateral carotid arteries: Secondary | ICD-10-CM | POA: Diagnosis not present

## 2019-08-31 DIAGNOSIS — M2041 Other hammer toe(s) (acquired), right foot: Secondary | ICD-10-CM | POA: Diagnosis not present

## 2019-08-31 DIAGNOSIS — I5032 Chronic diastolic (congestive) heart failure: Secondary | ICD-10-CM | POA: Diagnosis not present

## 2019-08-31 DIAGNOSIS — N184 Chronic kidney disease, stage 4 (severe): Secondary | ICD-10-CM | POA: Diagnosis not present

## 2019-08-31 DIAGNOSIS — R918 Other nonspecific abnormal finding of lung field: Secondary | ICD-10-CM | POA: Diagnosis not present

## 2019-08-31 DIAGNOSIS — K219 Gastro-esophageal reflux disease without esophagitis: Secondary | ICD-10-CM | POA: Diagnosis not present

## 2019-08-31 DIAGNOSIS — M48061 Spinal stenosis, lumbar region without neurogenic claudication: Secondary | ICD-10-CM | POA: Diagnosis not present

## 2019-08-31 DIAGNOSIS — G500 Trigeminal neuralgia: Secondary | ICD-10-CM | POA: Diagnosis not present

## 2019-08-31 DIAGNOSIS — E785 Hyperlipidemia, unspecified: Secondary | ICD-10-CM | POA: Diagnosis not present

## 2019-08-31 DIAGNOSIS — E669 Obesity, unspecified: Secondary | ICD-10-CM | POA: Diagnosis not present

## 2019-08-31 DIAGNOSIS — E1142 Type 2 diabetes mellitus with diabetic polyneuropathy: Secondary | ICD-10-CM | POA: Diagnosis not present

## 2019-08-31 DIAGNOSIS — M47812 Spondylosis without myelopathy or radiculopathy, cervical region: Secondary | ICD-10-CM | POA: Diagnosis not present

## 2019-08-31 DIAGNOSIS — I872 Venous insufficiency (chronic) (peripheral): Secondary | ICD-10-CM | POA: Diagnosis not present

## 2019-08-31 DIAGNOSIS — E042 Nontoxic multinodular goiter: Secondary | ICD-10-CM | POA: Diagnosis not present

## 2019-08-31 DIAGNOSIS — I152 Hypertension secondary to endocrine disorders: Secondary | ICD-10-CM | POA: Diagnosis not present

## 2019-08-31 DIAGNOSIS — N1330 Unspecified hydronephrosis: Secondary | ICD-10-CM | POA: Diagnosis not present

## 2019-08-31 DIAGNOSIS — M810 Age-related osteoporosis without current pathological fracture: Secondary | ICD-10-CM | POA: Diagnosis not present

## 2019-08-31 DIAGNOSIS — M47817 Spondylosis without myelopathy or radiculopathy, lumbosacral region: Secondary | ICD-10-CM | POA: Diagnosis not present

## 2019-08-31 DIAGNOSIS — I700 Atherosclerosis of aorta: Secondary | ICD-10-CM | POA: Diagnosis not present

## 2019-08-31 DIAGNOSIS — I130 Hypertensive heart and chronic kidney disease with heart failure and stage 1 through stage 4 chronic kidney disease, or unspecified chronic kidney disease: Secondary | ICD-10-CM | POA: Diagnosis not present

## 2019-08-31 DIAGNOSIS — M2042 Other hammer toe(s) (acquired), left foot: Secondary | ICD-10-CM | POA: Diagnosis not present

## 2019-08-31 DIAGNOSIS — M1712 Unilateral primary osteoarthritis, left knee: Secondary | ICD-10-CM | POA: Diagnosis not present

## 2019-08-31 DIAGNOSIS — E1151 Type 2 diabetes mellitus with diabetic peripheral angiopathy without gangrene: Secondary | ICD-10-CM | POA: Diagnosis not present

## 2019-08-31 DIAGNOSIS — G3184 Mild cognitive impairment, so stated: Secondary | ICD-10-CM | POA: Diagnosis not present

## 2019-09-03 ENCOUNTER — Other Ambulatory Visit (INDEPENDENT_AMBULATORY_CARE_PROVIDER_SITE_OTHER): Payer: Self-pay | Admitting: Vascular Surgery

## 2019-09-03 DIAGNOSIS — M79671 Pain in right foot: Secondary | ICD-10-CM

## 2019-09-03 DIAGNOSIS — M7989 Other specified soft tissue disorders: Secondary | ICD-10-CM

## 2019-09-04 DIAGNOSIS — M47817 Spondylosis without myelopathy or radiculopathy, lumbosacral region: Secondary | ICD-10-CM | POA: Diagnosis not present

## 2019-09-04 DIAGNOSIS — R918 Other nonspecific abnormal finding of lung field: Secondary | ICD-10-CM | POA: Diagnosis not present

## 2019-09-04 DIAGNOSIS — I5032 Chronic diastolic (congestive) heart failure: Secondary | ICD-10-CM | POA: Diagnosis not present

## 2019-09-04 DIAGNOSIS — K219 Gastro-esophageal reflux disease without esophagitis: Secondary | ICD-10-CM | POA: Diagnosis not present

## 2019-09-04 DIAGNOSIS — M2042 Other hammer toe(s) (acquired), left foot: Secondary | ICD-10-CM | POA: Diagnosis not present

## 2019-09-04 DIAGNOSIS — N1330 Unspecified hydronephrosis: Secondary | ICD-10-CM | POA: Diagnosis not present

## 2019-09-04 DIAGNOSIS — I130 Hypertensive heart and chronic kidney disease with heart failure and stage 1 through stage 4 chronic kidney disease, or unspecified chronic kidney disease: Secondary | ICD-10-CM | POA: Diagnosis not present

## 2019-09-04 DIAGNOSIS — I152 Hypertension secondary to endocrine disorders: Secondary | ICD-10-CM | POA: Diagnosis not present

## 2019-09-04 DIAGNOSIS — E785 Hyperlipidemia, unspecified: Secondary | ICD-10-CM | POA: Diagnosis not present

## 2019-09-04 DIAGNOSIS — E1142 Type 2 diabetes mellitus with diabetic polyneuropathy: Secondary | ICD-10-CM | POA: Diagnosis not present

## 2019-09-04 DIAGNOSIS — I872 Venous insufficiency (chronic) (peripheral): Secondary | ICD-10-CM | POA: Diagnosis not present

## 2019-09-04 DIAGNOSIS — M810 Age-related osteoporosis without current pathological fracture: Secondary | ICD-10-CM | POA: Diagnosis not present

## 2019-09-04 DIAGNOSIS — E1122 Type 2 diabetes mellitus with diabetic chronic kidney disease: Secondary | ICD-10-CM | POA: Diagnosis not present

## 2019-09-04 DIAGNOSIS — E669 Obesity, unspecified: Secondary | ICD-10-CM | POA: Diagnosis not present

## 2019-09-04 DIAGNOSIS — M2041 Other hammer toe(s) (acquired), right foot: Secondary | ICD-10-CM | POA: Diagnosis not present

## 2019-09-04 DIAGNOSIS — M1712 Unilateral primary osteoarthritis, left knee: Secondary | ICD-10-CM | POA: Diagnosis not present

## 2019-09-04 DIAGNOSIS — G3184 Mild cognitive impairment, so stated: Secondary | ICD-10-CM | POA: Diagnosis not present

## 2019-09-04 DIAGNOSIS — E042 Nontoxic multinodular goiter: Secondary | ICD-10-CM | POA: Diagnosis not present

## 2019-09-04 DIAGNOSIS — G500 Trigeminal neuralgia: Secondary | ICD-10-CM | POA: Diagnosis not present

## 2019-09-04 DIAGNOSIS — N184 Chronic kidney disease, stage 4 (severe): Secondary | ICD-10-CM | POA: Diagnosis not present

## 2019-09-04 DIAGNOSIS — M47812 Spondylosis without myelopathy or radiculopathy, cervical region: Secondary | ICD-10-CM | POA: Diagnosis not present

## 2019-09-04 DIAGNOSIS — M48061 Spinal stenosis, lumbar region without neurogenic claudication: Secondary | ICD-10-CM | POA: Diagnosis not present

## 2019-09-04 DIAGNOSIS — E1151 Type 2 diabetes mellitus with diabetic peripheral angiopathy without gangrene: Secondary | ICD-10-CM | POA: Diagnosis not present

## 2019-09-04 DIAGNOSIS — I6523 Occlusion and stenosis of bilateral carotid arteries: Secondary | ICD-10-CM | POA: Diagnosis not present

## 2019-09-04 DIAGNOSIS — I700 Atherosclerosis of aorta: Secondary | ICD-10-CM | POA: Diagnosis not present

## 2019-09-09 NOTE — Progress Notes (Signed)
Date:  09/09/2019   ID:  [name], DOB May 26, 1924, MRN [id number]  Patient Location:  [place] [address]   Provider location:   [facility], [place] office  PCP:  McLean-Scocuzza, Nino Glow, MD  Cardiologist:  Patsy Baltimore  Chief Complaint  Patient presents with   office visit    Pt BP dystolic is very high/ pt has swelling/ SOB/ pt has a dry cough/ Pt is shuffling feet more than walking, legs giving out/ pt when pt legs are elevated breaths are taking a while to exhale/ hardly sleeping very restless, very fatigue/ feet/ legs are turning purple/ feeling like something is biting pt inside of legs/ Pt concerned w/ medication. Meds verbally reviewed w/ pt.     History of Present Illness:    [name] is a 86 y.o. female  past medical history of diabetes,  hypertension,  SVT, aortic valve stenosis, s/p  TAVR September 2018 hospital admission July 2016 for SVT after she developed numerous bug bites with associated pain. She presents for follow-up of her TAVR and lower extremity edema  More SOB, worse with lifting legs Weight at home 187.8 Weight in 2019 was 157  Legs more swollen, ABD tight Takes torsemide 30 daily, sometimes 20 mg daily More sleeping, Hallucinations, talked to family that have passed  Has more cough, High fluid intake  For most of today's visit she is sleeping or lethargic but will wake up when aroused and is alert [name] present, [name] present  [name] and [name] by the patient's side most days, keeps patient at home  No falls at home Gait instability, walks with a  walker They report good appetite  blood pressure stable at home  EKG personally reviewed by myself on todays visit Shows wide-complex rhythm, unable to exclude flutter/fibrillation, no clear P waves, left bundle branch block rate 81 bpm  Other past medical history reviewed Previous hospitalization end of  January with discharge 04/01/2016 She had sepsis, enterococcus, had TEE done in the hospital to rule out endocarditis Moderate aortic valve stenosis noted  admitted to the hospital 09/20/2012 discharged on 09/25/2012 for Klebsiella UTI, shortness breath, tachycardia, SVT. Arrival to the hospital, she was very weak and was unable to swallow. She was felt to be very dehydrated. Her potassium was repleted, she was started on metoprolol and Cardizem.    Prior CV studies:   The following studies were reviewed today: Echo 02/2018 Left ventricle: The cavity size was normal. There was moderate   concentric hypertrophy. Systolic function was normal. The   estimated ejection fraction was in the range of 50% to 55%. Wall   motion was normal; there were no regional wall motion   abnormalities. Features are consistent with a pseudonormal left   ventricular filling pattern, with concomitant abnormal relaxation   and increased filling pressure (grade 2 diastolic dysfunction). - Aortic valve: A bioprosthesis ( TAVR) was present and functioning   normally. Mean gradient (S): 15 mm Hg. Valve area (VTI): 1.51   cm^2. - Mitral valve: Calcified annulus. - Left atrium: The atrium was mildly dilated. - Atrial septum: There was an atrial septal aneurysm. - Pulmonary arteries: Systolic pressure was mildly increased. PA   peak pressure: 45 mm Hg (S).   Past Medical History:  Diagnosis Date   Anemia    Carotid arterial disease ([place])    a. 08/2014 U/S: Bilateral < 50% stenosis. Patent vertebrals w/ antegrade  flow.    Cervical spine arthritis    Chronic diastolic CHF (congestive heart failure) ([place])    a. echo 2014: EF 55-60%, DD;  b. 08/2014 Echo: EF 55-60%, no RWMA, GR1DD; c. 02/2016 Echo: EF 65-70%, Gr1 DD.   Cognitive impairment    Depression    Diabetes mellitus without complication ([place])    Essential hypertension    GERD (gastroesophageal reflux disease)    Glaucoma    Gout    History of  renal impairment    Hyperlipidemia    Hypotension    a. Related to Norvasc - 11/2014 ED visit.   Multinodular goiter    a. Noted incidentally on CT 07/2012 and carotid U/S 08/2014;  b. Nl TSH 11/2014.   Paroxysmal SVT (supraventricular tachycardia) (HCC)    a. on Toprol    Prolapsed uterus    Severe aortic stenosis    a. 11/15/16: s/p TAVR Edwards Sapien 3 THV (size 23 mm, model # 9600TFX, serial # [id number])   Stroke ([place])    a. CT head 07/2014 showed small thalamic infarct   Vitamin deficiency    Past Surgical History:  Procedure Laterality Date   EYE SURGERY     unsure of what procedure, did know that laser was used   IR RADIOLOGY PERIPHERAL GUIDED IV START  11/03/2016   IR US GUIDE VASC ACCESS RIGHT  11/03/2016   RIGHT/LEFT HEART CATH AND CORONARY ANGIOGRAPHY N/A 10/26/2016   Procedure: RIGHT/LEFT HEART CATH AND CORONARY ANGIOGRAPHY;  Surgeon: [name], MD;  Location: [facility];  Service: Cardiovascular;  Laterality: N/A;   TEE WITHOUT CARDIOVERSION N/A 03/31/2016   Procedure: TRANSESOPHAGEAL ECHOCARDIOGRAM (TEE);  Surgeon: Minna Merritts, MD;  Location: [facility];  Service: Cardiovascular;  Laterality: N/A;   TEE WITHOUT CARDIOVERSION N/A 11/15/2016   Procedure: TRANSESOPHAGEAL ECHOCARDIOGRAM (TEE);  Surgeon: [name], MD;  Location: [place];  Service: Open Heart Surgery;  Laterality: N/A;   TRANSCATHETER AORTIC VALVE REPLACEMENT, TRANSFEMORAL N/A 11/15/2016   Procedure: TRANSCATHETER AORTIC VALVE REPLACEMENT, TRANSFEMORAL;  Surgeon: [name], MD;  Location: [place];  Service: Open Heart Surgery;  Laterality: N/A;   TUBAL LIGATION       No outpatient medications have been marked as taking for the 09/10/19 encounter (Appointment) with Minna Merritts, MD.     Allergies:   Metformin hcl, Metformin, and Metformin and related   Social History   Tobacco Use   Smoking status: Never Smoker   Smokeless tobacco: Never Used  Vaping Use   Vaping  Use: Never used  Substance Use Topics   Alcohol use: No   Drug use: No     Current Outpatient Medications on File Prior to Visit  Medication Sig Dispense Refill   ACCU-CHEK AVIVA PLUS test strip USE 1 STRIP TO TEST BLOOD SUGARS 3 TIMES A DAY 300 each 4   acetaminophen (TYLENOL) 500 MG tablet Take 1 tablet (500 mg total) by mouth every 6 (six) hours as needed for mild pain or headache. 360 tablet 3   allopurinol (ZYLOPRIM) 100 MG tablet Take 1 tablet (100 mg total) by mouth daily. 90 tablet 3   aspirin 81 MG chewable tablet Chew 1 tablet (81 mg total) by mouth daily. 90 tablet 3   blood glucose meter kit and supplies Dispense based on patient and insurance preference. Use 1x daily as directed. (FOR ICD-10 E10.9, E11.9). 1 each 0   brimonidine (ALPHAGAN P) 0.1 % SOLN Place 1 drop into the right eye  2 (two) times daily.     Carboxymethylcellulose Sodium (ARTIFICIAL TEARS OP) Apply 1 drop to eye 2 (two) times daily. Left eye     cholecalciferol (VITAMIN D) 25 MCG (1000 UT) tablet Take 1 tablet (1,000 Units total) by mouth daily. 90 tablet 3   clotrimazole (LOTRIMIN) 1 % cream APPLY TOPICALLY TO THE AFFECTED AREA ONCE A DAY AT BEDTIME FOR 7-14 DAYS 60 g 11   Continuous Blood Gluc Receiver (FREESTYLE LIBRE 14 DAY READER) DEVI 1 Device by Does not apply route every 14 (fourteen) days. 2 each 11   Continuous Blood Gluc Sensor (FREESTYLE LIBRE 14 DAY SENSOR) MISC 1 Device by Does not apply route every 14 (fourteen) days. 2 each 11   dimethicone (PROSHIELD PLUS SKIN PROTECTANT) 1 % cream 1 application to the buttock 2 times daily to help protect skin 114 g 11   Dorzolamide HCl-Timolol Mal (COSOPT OP) Place 1 drop into the right eye 2 (two) times daily.      fluticasone (FLONASE) 50 MCG/ACT nasal spray Place 1 spray into both nostrils daily. Prn max 2 sprays total each nose 16 g 11   gabapentin (NEURONTIN) 100 MG capsule Take 1 capsule (100 mg total) by mouth daily. Take one tablet ( 100  mg) by mouth one hour before bedtime every night. 30 capsule 0   gabapentin (NEURONTIN) 300 MG capsule Take 1 capsule (300 mg total) by mouth 2 (two) times daily. 180 capsule 3   glucose blood test strip Accu-Chek Aviva Plus test strips 300 each 4   glucose blood test strip qid Use as instructed E11.9 340 each 3   insulin aspart (NOVOLOG FLEXPEN) 100 UNIT/ML FlexPen TID before meals 15-30 minutes based on sugar reading before meals If sugar: 70-130 0 units, 131-180 2 units, 181-240 4 units, 241-300 6 units, 301-350 8 units, 351-400 10 units, >400 12 units and call doctor 15 mL 11   insulin degludec (TRESIBA FLEXTOUCH) 100 UNIT/ML FlexTouch Pen Inject 0.1 mLs (10 Units total) into the skin daily. 5 pen 11   Insulin Pen Needle (PEN NEEDLES) 30G X 8 MM MISC 1 Device by Does not apply route once a week. 30 each 11   Insulin Pen Needle (PEN NEEDLES) 30G X 8 MM MISC 1 Device by Does not apply route 3 (three) times daily before meals. 270 each 3   Insulin Pen Needle (PEN NEEDLES) 31G X 8 MM MISC 1 pen by Does not apply route daily. 90 each 3   Lancet Device MISC 1 Device by Does not apply route as directed. 1 each 0   Lancets (ONETOUCH ULTRASOFT) lancets Bid E 11.9 Use as instructed 200 each 12   latanoprost (XALATAN) 0.005 % ophthalmic solution Place 1 drop into the right eye at bedtime.      LORazepam (ATIVAN) 1 MG tablet Take 1 tablet (1 mg total) by mouth at bedtime as needed for anxiety. 30 tablet 2   lovastatin (MEVACOR) 20 MG tablet Take 1 tablet (20 mg total) by mouth at bedtime. Note reduced dose due to age and chronic medical issues d/c 40 mg dose 90 tablet 3   metoprolol succinate (TOPROL-XL) 25 MG 24 hr tablet Take 1 tablet (25 mg total) by mouth daily. 90 tablet 3   metroNIDAZOLE (METROGEL) 0.75 % vaginal gel Place 1 Applicatorful vaginally 2 (two) times daily. Prn BV 70 g 2   Multiple Vitamin (MULTI-VITAMIN PO) Take 1 tablet by mouth daily.     mupirocin ointment (BACTROBAN)  2 % Apply 1 application topically 2 (two) times daily. To open sores as needed 30 g 2   mupirocin ointment (BACTROBAN) 2 % Apply topically 2 (two) times daily. 30 g 2   ONETOUCH DELICA LANCETS 24Q MISC USE TO TEST BLOOD SUGAR ONCE A DAY  0   pantoprazole (PROTONIX) 40 MG tablet Take 1 tablet (40 mg total) by mouth daily. 30 minutes before breakfast OR dinner 90 tablet 3   pilocarpine (PILOCAR) 4 % ophthalmic solution Place 1 drop into the right eye 4 (four) times daily.      polyethylene glycol (MIRALAX) packet Take 17 g by mouth daily. prn 30 each 11   Polysacch Fe Complex-Vit D3 (NOVAFERRUM 125) 125-100 MG-UNT/5ML LIQD Take 125 mg by mouth daily. 360 mL 11   Potassium Chloride ER 20 MEQ TBCR TAKE 1 TABLET BY MOUTH EVERY DAY WITH FLUID PILL 90 tablet 1   Semaglutide,0.25 or 0.5MG/DOS, (OZEMPIC, 0.25 OR 0.5 MG/DOSE,) 2 MG/1.5ML SOPN Inject 0.25 mg into the skin once a week. 5 pen 11   sertraline (ZOLOFT) 50 MG tablet Take 1 tablet (50 mg total) by mouth daily. 90 tablet 3   sitaGLIPtin (JANUVIA) 25 MG tablet Take 1 tablet (25 mg total) by mouth daily. D/c 50 mg d/c glipizide 2.5 mg daily 90 tablet 3   torsemide (DEMADEX) 20 MG tablet Take 30MG by mouth every other day alternating with 40MG by mouth on alternating days. 180 tablet 0   traZODone (DESYREL) 100 MG tablet Take 0.5-1 tablets (50-100 mg total) by mouth at bedtime as needed. 90 tablet 3   triamcinolone cream (KENALOG) 0.1 % Apply 1 application topically 2 (two) times daily. Prn legs 80 g 0   vitamin C (ASCORBIC ACID) 500 MG tablet Take 1 tablet (500 mg total) by mouth daily. 90 tablet 3   Zinc Oxide 10 % OINT Apply 1 application topically daily as needed (rash). Applied to patient's bottom 226.8 g 11   No current facility-administered medications on file prior to visit.     Family Hx: The patient's family history includes Colitis in her father; Diabetes in her mother; Glaucoma in her mother; Peptic Ulcer in her  father.  ROS:   Please see the history of present illness.    Review of Systems  Constitutional: Negative.   Respiratory: Negative.   Cardiovascular: Negative.   Gastrointestinal: Negative.   Musculoskeletal: Positive for back pain.       Unsteady gait  Neurological: Negative.   Psychiatric/Behavioral: Negative.   All other systems reviewed and are negative.     Labs/Other Tests and Data Reviewed:    Recent Labs: 04/17/2019: B Natriuretic Peptide 77.0; TSH 1.52 07/16/2019: ALT 12; BUN 14; Creatinine, Ser 1.39; Hemoglobin 11.5; Platelets 195.0; Potassium 4.0; Sodium 139   Recent Lipid Panel Lab Results  Component Value Date/Time   CHOL 167 07/16/2019 09:12 AM   TRIG 172.0 (H) 07/16/2019 09:12 AM   HDL 36.40 (L) 07/16/2019 09:12 AM   CHOLHDL 5 07/16/2019 09:12 AM   LDLCALC 96 07/16/2019 09:12 AM   LDLDIRECT 73.0 04/17/2019 11:03 AM    Wt Readings from Last 3 Encounters:  08/28/19 185 lb (83.9 kg)  07/03/19 187 lb 6 oz (85 kg)  06/13/19 186 lb (84.4 kg)     Exam:    Vital Signs: Vital signs may also be detailed in the HPI BP 104/62 (BP Location: Right Arm, Patient Position: Sitting, Cuff Size: Normal)    Pulse 81    Ht  5' (1.524 m)    Wt 181 lb 4 oz (82.2 kg)    SpO2 90%    BMI 35.40 kg/m   Constitutional:  oriented to person, place, and time. No distress.  Lethargic, sitting in a wheelchair HENT:  Head: Normocephalic and atraumatic.  Eyes:  no discharge. No scleral icterus.  Neck: Normal range of motion. Neck supple. No JVD present.  Cardiovascular: Normal rate, regular rhythm, normal heart sounds and intact distal pulses. Exam reveals no gallop and no friction rub. No edema No murmur heard. Pulmonary/Chest: Effort normal and breath sounds normal. No stridor. No respiratory distress.  no wheezes.  no rales.  no tenderness.  Mild dullness at the bases worse on the left than the right Abdominal: Soft.  no distension.  no tenderness.  Musculoskeletal: Normal range of  motion.  no  tenderness or deformity.  Neurological: Full exam not performed Skin: Skin is warm and dry. No rash noted. not diaphoretic.  Psychiatric: Alert when aroused, full exam not performed    ASSESSMENT & PLAN:    SVT (supraventricular tachycardia) (HCC) No significant arrhythmia Continue metoprolol, stable  Essential hypertension, malignant Blood pressure well controlled at home per family, no changes made, need more aggressive diuresis as below  Chronic diastolic CHF (congestive heart failure) (HCC) High fluid intake discussed, need to cut down, strategies outlined On increase torsemide up to 40 mg daily with extra 40 in the afternoon for weight over 180 pounds Prior weight in the 150s now in the 180s, likely way up on the fluid Will try to do this gently given her age and general debility Potassium 20 with each torsemide 40 Discussed daily weights, CHF education  Aortic valve stenosis, s/p TAVR  Echocardiogram done 02/2018 We will repeat echo once fluid is off weight down less than 170  SOB (shortness of breath) Not walking very much, legs are heavy from fluid and lymphedema Aggressive diuresis as above  Diabetes mellitus without complication ([place]) We have encouraged continued exercise, careful diet management in an effort to lose weight.  Acute midline low back pain, with sciatica presence unspecified Chronic back pain, stable Walks with walker   Total encounter time more than 25 minutes  Greater than 50% was spent in counseling and coordination of care with the patient   Disposition: Follow-up in 3 months   Signed, Ida Rogue, MD  09/09/2019 6:54 PM    [place] Office [address], [place], [place] [address]

## 2019-09-10 ENCOUNTER — Other Ambulatory Visit: Payer: Self-pay

## 2019-09-10 ENCOUNTER — Ambulatory Visit: Payer: PPO | Admitting: Cardiovascular Disease

## 2019-09-10 ENCOUNTER — Encounter: Payer: Self-pay | Admitting: Cardiovascular Disease

## 2019-09-10 VITALS — BP 104/62 | HR 81 | Ht 60.0 in | Wt 181.2 lb

## 2019-09-10 DIAGNOSIS — I5032 Chronic diastolic (congestive) heart failure: Secondary | ICD-10-CM | POA: Diagnosis not present

## 2019-09-10 DIAGNOSIS — Z794 Long term (current) use of insulin: Secondary | ICD-10-CM | POA: Diagnosis not present

## 2019-09-10 DIAGNOSIS — I890 Lymphedema, not elsewhere classified: Secondary | ICD-10-CM

## 2019-09-10 DIAGNOSIS — Z952 Presence of prosthetic heart valve: Secondary | ICD-10-CM | POA: Diagnosis not present

## 2019-09-10 DIAGNOSIS — E1122 Type 2 diabetes mellitus with diabetic chronic kidney disease: Secondary | ICD-10-CM | POA: Diagnosis not present

## 2019-09-10 DIAGNOSIS — I129 Hypertensive chronic kidney disease with stage 1 through stage 4 chronic kidney disease, or unspecified chronic kidney disease: Secondary | ICD-10-CM | POA: Diagnosis not present

## 2019-09-10 DIAGNOSIS — I350 Nonrheumatic aortic (valve) stenosis: Secondary | ICD-10-CM | POA: Diagnosis not present

## 2019-09-10 DIAGNOSIS — N184 Chronic kidney disease, stage 4 (severe): Secondary | ICD-10-CM | POA: Diagnosis not present

## 2019-09-10 DIAGNOSIS — E876 Hypokalemia: Secondary | ICD-10-CM | POA: Diagnosis not present

## 2019-09-10 DIAGNOSIS — I10 Essential (primary) hypertension: Secondary | ICD-10-CM

## 2019-09-10 DIAGNOSIS — I272 Pulmonary hypertension, unspecified: Secondary | ICD-10-CM

## 2019-09-10 DIAGNOSIS — I700 Atherosclerosis of aorta: Secondary | ICD-10-CM | POA: Diagnosis not present

## 2019-09-10 DIAGNOSIS — N1832 Chronic kidney disease, stage 3b: Secondary | ICD-10-CM | POA: Diagnosis not present

## 2019-09-10 MED ORDER — POTASSIUM CHLORIDE ER 20 MEQ PO TBCR: 20.0000 meq | EXTENDED_RELEASE_TABLET | ORAL | 3 refills | Status: DC

## 2019-09-10 MED ORDER — TORSEMIDE 20 MG PO TABS: 40.0000 mg | ORAL_TABLET | ORAL | 3 refills | Status: AC

## 2019-09-10 NOTE — Patient Instructions (Addendum)
Cut back on fluid intake   Medication Instructions:  Torsemide 40 mg daily with extra torsemide 40 mg after lunch for weight >180 For every torsemide 40 mg take with potassium 20  If you need a refill on your cardiac medications before your next appointment, please call your pharmacy.    Lab work: No new labs needed   If you have labs (blood work) drawn today and your tests are completely normal, you will receive your results only by: Marland Kitchen MyChart Message (if you have MyChart) OR . A paper copy in the mail If you have any lab test that is abnormal or we need to change your treatment, we will call you to review the results.   Testing/Procedures: No new testing needed   Follow-Up: At [facility], you and your health needs are our priority.  As part of our continuing mission to provide you with exceptional heart care, we have created designated Provider Care Teams.  These Care Teams include your primary Cardiologist (physician) and Advanced Practice Providers (APPs -  Physician Assistants and Nurse Practitioners) who all work together to provide you with the care you need, when you need it.  . You will need a follow up appointment in 3 months   . Providers on your designated Care Team:   . Murray Hodgkins, NP . Christell Faith, PA-C . Marrianne Mood, PA-C  Any Other Special Instructions Will Be Listed Below (If Applicable).  For educational health videos Log in to : [email or web address] Or : [email or web address], password : [email or web address]

## 2019-09-11 ENCOUNTER — Encounter (INDEPENDENT_AMBULATORY_CARE_PROVIDER_SITE_OTHER): Payer: PPO

## 2019-09-11 ENCOUNTER — Ambulatory Visit (INDEPENDENT_AMBULATORY_CARE_PROVIDER_SITE_OTHER): Payer: PPO | Admitting: Nurse Practitioner

## 2019-09-11 DIAGNOSIS — M1712 Unilateral primary osteoarthritis, left knee: Secondary | ICD-10-CM | POA: Diagnosis not present

## 2019-09-11 DIAGNOSIS — G500 Trigeminal neuralgia: Secondary | ICD-10-CM | POA: Diagnosis not present

## 2019-09-11 DIAGNOSIS — I130 Hypertensive heart and chronic kidney disease with heart failure and stage 1 through stage 4 chronic kidney disease, or unspecified chronic kidney disease: Secondary | ICD-10-CM | POA: Diagnosis not present

## 2019-09-11 DIAGNOSIS — G3184 Mild cognitive impairment, so stated: Secondary | ICD-10-CM | POA: Diagnosis not present

## 2019-09-11 DIAGNOSIS — N1330 Unspecified hydronephrosis: Secondary | ICD-10-CM | POA: Diagnosis not present

## 2019-09-11 DIAGNOSIS — K219 Gastro-esophageal reflux disease without esophagitis: Secondary | ICD-10-CM | POA: Diagnosis not present

## 2019-09-11 DIAGNOSIS — I5032 Chronic diastolic (congestive) heart failure: Secondary | ICD-10-CM | POA: Diagnosis not present

## 2019-09-11 DIAGNOSIS — E1151 Type 2 diabetes mellitus with diabetic peripheral angiopathy without gangrene: Secondary | ICD-10-CM | POA: Diagnosis not present

## 2019-09-11 DIAGNOSIS — M48061 Spinal stenosis, lumbar region without neurogenic claudication: Secondary | ICD-10-CM | POA: Diagnosis not present

## 2019-09-11 DIAGNOSIS — E1142 Type 2 diabetes mellitus with diabetic polyneuropathy: Secondary | ICD-10-CM | POA: Diagnosis not present

## 2019-09-11 DIAGNOSIS — N184 Chronic kidney disease, stage 4 (severe): Secondary | ICD-10-CM | POA: Diagnosis not present

## 2019-09-11 DIAGNOSIS — R918 Other nonspecific abnormal finding of lung field: Secondary | ICD-10-CM | POA: Diagnosis not present

## 2019-09-11 DIAGNOSIS — M810 Age-related osteoporosis without current pathological fracture: Secondary | ICD-10-CM | POA: Diagnosis not present

## 2019-09-11 DIAGNOSIS — I6523 Occlusion and stenosis of bilateral carotid arteries: Secondary | ICD-10-CM | POA: Diagnosis not present

## 2019-09-11 DIAGNOSIS — E042 Nontoxic multinodular goiter: Secondary | ICD-10-CM | POA: Diagnosis not present

## 2019-09-11 DIAGNOSIS — M47812 Spondylosis without myelopathy or radiculopathy, cervical region: Secondary | ICD-10-CM | POA: Diagnosis not present

## 2019-09-11 DIAGNOSIS — I152 Hypertension secondary to endocrine disorders: Secondary | ICD-10-CM | POA: Diagnosis not present

## 2019-09-11 DIAGNOSIS — E1122 Type 2 diabetes mellitus with diabetic chronic kidney disease: Secondary | ICD-10-CM | POA: Diagnosis not present

## 2019-09-11 DIAGNOSIS — I700 Atherosclerosis of aorta: Secondary | ICD-10-CM | POA: Diagnosis not present

## 2019-09-11 DIAGNOSIS — I872 Venous insufficiency (chronic) (peripheral): Secondary | ICD-10-CM | POA: Diagnosis not present

## 2019-09-11 DIAGNOSIS — E669 Obesity, unspecified: Secondary | ICD-10-CM | POA: Diagnosis not present

## 2019-09-11 DIAGNOSIS — E785 Hyperlipidemia, unspecified: Secondary | ICD-10-CM | POA: Diagnosis not present

## 2019-09-11 DIAGNOSIS — M2041 Other hammer toe(s) (acquired), right foot: Secondary | ICD-10-CM | POA: Diagnosis not present

## 2019-09-11 DIAGNOSIS — M47817 Spondylosis without myelopathy or radiculopathy, lumbosacral region: Secondary | ICD-10-CM | POA: Diagnosis not present

## 2019-09-11 DIAGNOSIS — M2042 Other hammer toe(s) (acquired), left foot: Secondary | ICD-10-CM | POA: Diagnosis not present

## 2019-09-17 DIAGNOSIS — E1142 Type 2 diabetes mellitus with diabetic polyneuropathy: Secondary | ICD-10-CM | POA: Diagnosis not present

## 2019-09-17 DIAGNOSIS — M47817 Spondylosis without myelopathy or radiculopathy, lumbosacral region: Secondary | ICD-10-CM | POA: Diagnosis not present

## 2019-09-17 DIAGNOSIS — K219 Gastro-esophageal reflux disease without esophagitis: Secondary | ICD-10-CM | POA: Diagnosis not present

## 2019-09-17 DIAGNOSIS — M1712 Unilateral primary osteoarthritis, left knee: Secondary | ICD-10-CM | POA: Diagnosis not present

## 2019-09-17 DIAGNOSIS — M810 Age-related osteoporosis without current pathological fracture: Secondary | ICD-10-CM | POA: Diagnosis not present

## 2019-09-17 DIAGNOSIS — E1122 Type 2 diabetes mellitus with diabetic chronic kidney disease: Secondary | ICD-10-CM | POA: Diagnosis not present

## 2019-09-17 DIAGNOSIS — E042 Nontoxic multinodular goiter: Secondary | ICD-10-CM | POA: Diagnosis not present

## 2019-09-17 DIAGNOSIS — I6523 Occlusion and stenosis of bilateral carotid arteries: Secondary | ICD-10-CM | POA: Diagnosis not present

## 2019-09-17 DIAGNOSIS — M48061 Spinal stenosis, lumbar region without neurogenic claudication: Secondary | ICD-10-CM | POA: Diagnosis not present

## 2019-09-17 DIAGNOSIS — E669 Obesity, unspecified: Secondary | ICD-10-CM | POA: Diagnosis not present

## 2019-09-17 DIAGNOSIS — E1151 Type 2 diabetes mellitus with diabetic peripheral angiopathy without gangrene: Secondary | ICD-10-CM | POA: Diagnosis not present

## 2019-09-17 DIAGNOSIS — I5032 Chronic diastolic (congestive) heart failure: Secondary | ICD-10-CM | POA: Diagnosis not present

## 2019-09-17 DIAGNOSIS — I700 Atherosclerosis of aorta: Secondary | ICD-10-CM | POA: Diagnosis not present

## 2019-09-17 DIAGNOSIS — I152 Hypertension secondary to endocrine disorders: Secondary | ICD-10-CM | POA: Diagnosis not present

## 2019-09-17 DIAGNOSIS — N1330 Unspecified hydronephrosis: Secondary | ICD-10-CM | POA: Diagnosis not present

## 2019-09-17 DIAGNOSIS — I130 Hypertensive heart and chronic kidney disease with heart failure and stage 1 through stage 4 chronic kidney disease, or unspecified chronic kidney disease: Secondary | ICD-10-CM | POA: Diagnosis not present

## 2019-09-17 DIAGNOSIS — N184 Chronic kidney disease, stage 4 (severe): Secondary | ICD-10-CM | POA: Diagnosis not present

## 2019-09-17 DIAGNOSIS — I872 Venous insufficiency (chronic) (peripheral): Secondary | ICD-10-CM | POA: Diagnosis not present

## 2019-09-17 DIAGNOSIS — E785 Hyperlipidemia, unspecified: Secondary | ICD-10-CM | POA: Diagnosis not present

## 2019-09-17 DIAGNOSIS — G3184 Mild cognitive impairment, so stated: Secondary | ICD-10-CM | POA: Diagnosis not present

## 2019-09-17 DIAGNOSIS — G500 Trigeminal neuralgia: Secondary | ICD-10-CM | POA: Diagnosis not present

## 2019-09-17 DIAGNOSIS — R918 Other nonspecific abnormal finding of lung field: Secondary | ICD-10-CM | POA: Diagnosis not present

## 2019-09-17 DIAGNOSIS — M47812 Spondylosis without myelopathy or radiculopathy, cervical region: Secondary | ICD-10-CM | POA: Diagnosis not present

## 2019-09-17 DIAGNOSIS — M2041 Other hammer toe(s) (acquired), right foot: Secondary | ICD-10-CM | POA: Diagnosis not present

## 2019-09-17 DIAGNOSIS — M2042 Other hammer toe(s) (acquired), left foot: Secondary | ICD-10-CM | POA: Diagnosis not present

## 2019-09-23 ENCOUNTER — Other Ambulatory Visit: Payer: Self-pay

## 2019-09-23 ENCOUNTER — Ambulatory Visit: Payer: PPO | Admitting: Podiatry

## 2019-09-23 ENCOUNTER — Encounter: Payer: Self-pay | Admitting: Podiatry

## 2019-09-23 DIAGNOSIS — E876 Hypokalemia: Secondary | ICD-10-CM | POA: Diagnosis not present

## 2019-09-23 DIAGNOSIS — D689 Coagulation defect, unspecified: Secondary | ICD-10-CM

## 2019-09-23 DIAGNOSIS — E1142 Type 2 diabetes mellitus with diabetic polyneuropathy: Secondary | ICD-10-CM

## 2019-09-23 DIAGNOSIS — M79675 Pain in left toe(s): Secondary | ICD-10-CM | POA: Diagnosis not present

## 2019-09-23 DIAGNOSIS — I739 Peripheral vascular disease, unspecified: Secondary | ICD-10-CM

## 2019-09-23 DIAGNOSIS — M79674 Pain in right toe(s): Secondary | ICD-10-CM | POA: Diagnosis not present

## 2019-09-23 DIAGNOSIS — I129 Hypertensive chronic kidney disease with stage 1 through stage 4 chronic kidney disease, or unspecified chronic kidney disease: Secondary | ICD-10-CM | POA: Diagnosis not present

## 2019-09-23 DIAGNOSIS — I890 Lymphedema, not elsewhere classified: Secondary | ICD-10-CM | POA: Diagnosis not present

## 2019-09-23 DIAGNOSIS — B351 Tinea unguium: Secondary | ICD-10-CM

## 2019-09-23 DIAGNOSIS — N184 Chronic kidney disease, stage 4 (severe): Secondary | ICD-10-CM | POA: Diagnosis not present

## 2019-09-23 DIAGNOSIS — N1832 Chronic kidney disease, stage 3b: Secondary | ICD-10-CM | POA: Diagnosis not present

## 2019-09-23 DIAGNOSIS — I5022 Chronic systolic (congestive) heart failure: Secondary | ICD-10-CM | POA: Diagnosis not present

## 2019-09-23 DIAGNOSIS — E1122 Type 2 diabetes mellitus with diabetic chronic kidney disease: Secondary | ICD-10-CM | POA: Diagnosis not present

## 2019-09-23 NOTE — Progress Notes (Signed)
This patient returns to my office for at risk foot care.  This patient requires this care by a professional since this patient will be at risk due to having diabetic neuropathy  PAD  And  CKD.   This patient is unable to cut nails herself since the patient cannot reach her nails.These nails are painful walking and wearing shoes.  This patient presents for at risk foot care today.  General Appearance  Alert, conversant and in no acute stress.  Vascular  Dorsalis pedis and posterior tibial  pulses are  Weakly  palpable  bilaterally.  Capillary return is within normal limits  bilaterally. Temperature is within normal limits  bilaterally.  Neurologic  Senn-Weinstein monofilament wire test within normal limits  bilaterally. Muscle power within normal limits bilaterally.  Nails Thick disfigured discolored nails with subungual debris  from hallux to fifth toes bilaterally. No evidence of bacterial infection or drainage bilaterally.  Orthopedic  No limitations of motion  feet .  No crepitus or effusions noted.  No bony pathology or digital deformities noted.  HAV  B/L>  Skin  normotropic skin with no porokeratosis noted bilaterally.  No signs of infections or ulcers noted.     Onychomycosis  Pain in right toes  Pain in left toes  Consent was obtained for treatment procedures.   Mechanical debridement of nails 1-5  bilaterally performed with a nail nipper.  Filed with dremel without incident.    Return office visit   3 months                   Told patient to return for periodic foot care and evaluation due to potential at risk complications.   Gardiner Barefoot DPM

## 2019-09-24 ENCOUNTER — Other Ambulatory Visit: Payer: Self-pay | Admitting: Family

## 2019-09-24 DIAGNOSIS — E1142 Type 2 diabetes mellitus with diabetic polyneuropathy: Secondary | ICD-10-CM

## 2019-09-25 ENCOUNTER — Encounter (INDEPENDENT_AMBULATORY_CARE_PROVIDER_SITE_OTHER): Payer: Self-pay | Admitting: Nurse Practitioner

## 2019-09-25 ENCOUNTER — Ambulatory Visit (INDEPENDENT_AMBULATORY_CARE_PROVIDER_SITE_OTHER): Payer: PPO

## 2019-09-25 ENCOUNTER — Encounter: Payer: PPO | Attending: Internal Medicine | Admitting: Internal Medicine

## 2019-09-25 ENCOUNTER — Other Ambulatory Visit: Payer: Self-pay

## 2019-09-25 ENCOUNTER — Ambulatory Visit (INDEPENDENT_AMBULATORY_CARE_PROVIDER_SITE_OTHER): Payer: PPO | Admitting: Nurse Practitioner

## 2019-09-25 VITALS — BP 125/78 | HR 75 | Resp 16

## 2019-09-25 DIAGNOSIS — R29898 Other symptoms and signs involving the musculoskeletal system: Secondary | ICD-10-CM

## 2019-09-25 DIAGNOSIS — R600 Localized edema: Secondary | ICD-10-CM | POA: Diagnosis not present

## 2019-09-25 DIAGNOSIS — E785 Hyperlipidemia, unspecified: Secondary | ICD-10-CM

## 2019-09-25 DIAGNOSIS — M79671 Pain in right foot: Secondary | ICD-10-CM

## 2019-09-25 DIAGNOSIS — Z09 Encounter for follow-up examination after completed treatment for conditions other than malignant neoplasm: Secondary | ICD-10-CM | POA: Diagnosis not present

## 2019-09-25 DIAGNOSIS — M7989 Other specified soft tissue disorders: Secondary | ICD-10-CM

## 2019-09-25 DIAGNOSIS — I890 Lymphedema, not elsewhere classified: Secondary | ICD-10-CM | POA: Diagnosis not present

## 2019-09-25 DIAGNOSIS — I10 Essential (primary) hypertension: Secondary | ICD-10-CM | POA: Diagnosis not present

## 2019-09-25 DIAGNOSIS — I739 Peripheral vascular disease, unspecified: Secondary | ICD-10-CM

## 2019-09-26 ENCOUNTER — Other Ambulatory Visit: Payer: Self-pay | Admitting: Internal Medicine

## 2019-09-26 ENCOUNTER — Ambulatory Visit: Payer: PPO | Admitting: Internal Medicine

## 2019-09-26 DIAGNOSIS — E785 Hyperlipidemia, unspecified: Secondary | ICD-10-CM | POA: Diagnosis not present

## 2019-09-26 DIAGNOSIS — R918 Other nonspecific abnormal finding of lung field: Secondary | ICD-10-CM | POA: Diagnosis not present

## 2019-09-26 DIAGNOSIS — I152 Hypertension secondary to endocrine disorders: Secondary | ICD-10-CM | POA: Diagnosis not present

## 2019-09-26 DIAGNOSIS — I700 Atherosclerosis of aorta: Secondary | ICD-10-CM | POA: Diagnosis not present

## 2019-09-26 DIAGNOSIS — E1142 Type 2 diabetes mellitus with diabetic polyneuropathy: Secondary | ICD-10-CM | POA: Diagnosis not present

## 2019-09-26 DIAGNOSIS — M1712 Unilateral primary osteoarthritis, left knee: Secondary | ICD-10-CM | POA: Diagnosis not present

## 2019-09-26 DIAGNOSIS — K219 Gastro-esophageal reflux disease without esophagitis: Secondary | ICD-10-CM | POA: Diagnosis not present

## 2019-09-26 DIAGNOSIS — I872 Venous insufficiency (chronic) (peripheral): Secondary | ICD-10-CM | POA: Diagnosis not present

## 2019-09-26 DIAGNOSIS — D509 Iron deficiency anemia, unspecified: Secondary | ICD-10-CM

## 2019-09-26 DIAGNOSIS — M47812 Spondylosis without myelopathy or radiculopathy, cervical region: Secondary | ICD-10-CM | POA: Diagnosis not present

## 2019-09-26 DIAGNOSIS — I6523 Occlusion and stenosis of bilateral carotid arteries: Secondary | ICD-10-CM | POA: Diagnosis not present

## 2019-09-26 DIAGNOSIS — E669 Obesity, unspecified: Secondary | ICD-10-CM | POA: Diagnosis not present

## 2019-09-26 DIAGNOSIS — M48061 Spinal stenosis, lumbar region without neurogenic claudication: Secondary | ICD-10-CM | POA: Diagnosis not present

## 2019-09-26 DIAGNOSIS — N1330 Unspecified hydronephrosis: Secondary | ICD-10-CM | POA: Diagnosis not present

## 2019-09-26 DIAGNOSIS — M47817 Spondylosis without myelopathy or radiculopathy, lumbosacral region: Secondary | ICD-10-CM | POA: Diagnosis not present

## 2019-09-26 DIAGNOSIS — M2042 Other hammer toe(s) (acquired), left foot: Secondary | ICD-10-CM | POA: Diagnosis not present

## 2019-09-26 DIAGNOSIS — E042 Nontoxic multinodular goiter: Secondary | ICD-10-CM | POA: Diagnosis not present

## 2019-09-26 DIAGNOSIS — I5032 Chronic diastolic (congestive) heart failure: Secondary | ICD-10-CM | POA: Diagnosis not present

## 2019-09-26 DIAGNOSIS — G3184 Mild cognitive impairment, so stated: Secondary | ICD-10-CM | POA: Diagnosis not present

## 2019-09-26 DIAGNOSIS — M2041 Other hammer toe(s) (acquired), right foot: Secondary | ICD-10-CM | POA: Diagnosis not present

## 2019-09-26 DIAGNOSIS — E1151 Type 2 diabetes mellitus with diabetic peripheral angiopathy without gangrene: Secondary | ICD-10-CM | POA: Diagnosis not present

## 2019-09-26 DIAGNOSIS — M810 Age-related osteoporosis without current pathological fracture: Secondary | ICD-10-CM | POA: Diagnosis not present

## 2019-09-26 DIAGNOSIS — I130 Hypertensive heart and chronic kidney disease with heart failure and stage 1 through stage 4 chronic kidney disease, or unspecified chronic kidney disease: Secondary | ICD-10-CM | POA: Diagnosis not present

## 2019-09-26 DIAGNOSIS — N184 Chronic kidney disease, stage 4 (severe): Secondary | ICD-10-CM | POA: Diagnosis not present

## 2019-09-26 DIAGNOSIS — G500 Trigeminal neuralgia: Secondary | ICD-10-CM | POA: Diagnosis not present

## 2019-09-26 DIAGNOSIS — E1122 Type 2 diabetes mellitus with diabetic chronic kidney disease: Secondary | ICD-10-CM | POA: Diagnosis not present

## 2019-09-26 MED ORDER — FERROUS SULFATE 300 (60 FE) MG/5ML PO SYRP: 300.0000 mg | ORAL_SOLUTION | Freq: Every day | ORAL | 12 refills | Status: DC

## 2019-09-30 ENCOUNTER — Encounter (INDEPENDENT_AMBULATORY_CARE_PROVIDER_SITE_OTHER): Payer: Self-pay | Admitting: Nurse Practitioner

## 2019-09-30 ENCOUNTER — Telehealth: Payer: Self-pay | Admitting: Internal Medicine

## 2019-09-30 DIAGNOSIS — M810 Age-related osteoporosis without current pathological fracture: Secondary | ICD-10-CM | POA: Diagnosis not present

## 2019-09-30 DIAGNOSIS — I152 Hypertension secondary to endocrine disorders: Secondary | ICD-10-CM | POA: Diagnosis not present

## 2019-09-30 DIAGNOSIS — E1142 Type 2 diabetes mellitus with diabetic polyneuropathy: Secondary | ICD-10-CM | POA: Diagnosis not present

## 2019-09-30 DIAGNOSIS — I5032 Chronic diastolic (congestive) heart failure: Secondary | ICD-10-CM | POA: Diagnosis not present

## 2019-09-30 DIAGNOSIS — R918 Other nonspecific abnormal finding of lung field: Secondary | ICD-10-CM | POA: Diagnosis not present

## 2019-09-30 DIAGNOSIS — E1122 Type 2 diabetes mellitus with diabetic chronic kidney disease: Secondary | ICD-10-CM | POA: Diagnosis not present

## 2019-09-30 DIAGNOSIS — E785 Hyperlipidemia, unspecified: Secondary | ICD-10-CM | POA: Diagnosis not present

## 2019-09-30 DIAGNOSIS — E042 Nontoxic multinodular goiter: Secondary | ICD-10-CM | POA: Diagnosis not present

## 2019-09-30 DIAGNOSIS — M47812 Spondylosis without myelopathy or radiculopathy, cervical region: Secondary | ICD-10-CM | POA: Diagnosis not present

## 2019-09-30 DIAGNOSIS — M47817 Spondylosis without myelopathy or radiculopathy, lumbosacral region: Secondary | ICD-10-CM | POA: Diagnosis not present

## 2019-09-30 DIAGNOSIS — G500 Trigeminal neuralgia: Secondary | ICD-10-CM | POA: Diagnosis not present

## 2019-09-30 DIAGNOSIS — M48061 Spinal stenosis, lumbar region without neurogenic claudication: Secondary | ICD-10-CM | POA: Diagnosis not present

## 2019-09-30 DIAGNOSIS — N184 Chronic kidney disease, stage 4 (severe): Secondary | ICD-10-CM | POA: Diagnosis not present

## 2019-09-30 DIAGNOSIS — I130 Hypertensive heart and chronic kidney disease with heart failure and stage 1 through stage 4 chronic kidney disease, or unspecified chronic kidney disease: Secondary | ICD-10-CM | POA: Diagnosis not present

## 2019-09-30 DIAGNOSIS — I6523 Occlusion and stenosis of bilateral carotid arteries: Secondary | ICD-10-CM | POA: Diagnosis not present

## 2019-09-30 DIAGNOSIS — K219 Gastro-esophageal reflux disease without esophagitis: Secondary | ICD-10-CM | POA: Diagnosis not present

## 2019-09-30 DIAGNOSIS — G3184 Mild cognitive impairment, so stated: Secondary | ICD-10-CM | POA: Diagnosis not present

## 2019-09-30 DIAGNOSIS — M1712 Unilateral primary osteoarthritis, left knee: Secondary | ICD-10-CM | POA: Diagnosis not present

## 2019-09-30 DIAGNOSIS — M2041 Other hammer toe(s) (acquired), right foot: Secondary | ICD-10-CM | POA: Diagnosis not present

## 2019-09-30 DIAGNOSIS — I872 Venous insufficiency (chronic) (peripheral): Secondary | ICD-10-CM | POA: Diagnosis not present

## 2019-09-30 DIAGNOSIS — M2042 Other hammer toe(s) (acquired), left foot: Secondary | ICD-10-CM | POA: Diagnosis not present

## 2019-09-30 DIAGNOSIS — E669 Obesity, unspecified: Secondary | ICD-10-CM | POA: Diagnosis not present

## 2019-09-30 DIAGNOSIS — I700 Atherosclerosis of aorta: Secondary | ICD-10-CM | POA: Diagnosis not present

## 2019-09-30 DIAGNOSIS — N1330 Unspecified hydronephrosis: Secondary | ICD-10-CM | POA: Diagnosis not present

## 2019-09-30 DIAGNOSIS — E1151 Type 2 diabetes mellitus with diabetic peripheral angiopathy without gangrene: Secondary | ICD-10-CM | POA: Diagnosis not present

## 2019-09-30 NOTE — Progress Notes (Addendum)
Cassandra TILLA, WILBORN ([id number]) Visit Report for 09/25/2019 Allergy List Details Patient Name: [name], [name]. Date of Service: 09/25/2019 12:45 PM Medical Record Number: [id number] Patient Account Number: [id number] Date of Birth/Sex: Aug 18, 1924 (86 y.o. F) Treating RN: Cornell Barman Primary Care Yahaira Bruski: Myrtie Hawk Other Clinician: Referring Sanda Dejoy: Referral, Self Treating Nathan Stallworth/Extender: Ricard Dillon Weeks in Treatment: 0 Allergies Active Allergies metformin Reaction: nausea Severity: Moderate Allergy Notes Electronic Signature(s) Signed: 09/30/2019 4:39:14 PM By: Gretta Cool, BSN, RN, CWS, Kim RN, BSN Previous Signature: 09/25/2019 1:47:00 PM Version By: Gretta Cool, BSN, RN, CWS, Kim RN, BSN Entered By: Gretta Cool, BSN, RN, CWS, Kim on 09/30/2019 16:39:14 [name], [name] ([id number]) -------------------------------------------------------------------------------- Arrival Information Details Patient Name: [name], Cassandra M. Date of Service: 09/25/2019 12:45 PM Medical Record Number: [id number] Patient Account Number: [id number] Date of Birth/Sex: September 26, 1924 (86 y.o. F) Treating RN: Cornell Barman Primary Care Nahal Wanless: Myrtie Hawk Other Clinician: Referring Tyana Butzer: Referral, Self Treating Amrie Gurganus/Extender: Tito Dine in Treatment: 0 Visit Information Patient Arrived: Wheel Chair Arrival Time: 12:51 Accompanied By: daughter Transfer Assistance: Manual Patient Identification Verified: Yes Secondary Verification Process Completed: Yes History Since Last Visit Added or deleted any medications: No Any new allergies or adverse reactions: No Had a fall or experienced change in activities of daily living that may affect risk of falls: No Signs or symptoms of abuse/neglect since last visito No Hospitalized since last visit: No Implantable device outside of the clinic excluding cellular tissue based products placed in the  center since last visit: No Pain Present Now: No Electronic Signature(s) Signed: 10/01/2019 1:03:56 PM By: Gretta Cool, BSN, RN, CWS, Kim RN, BSN Previous Signature: 09/30/2019 4:38:38 PM Version By: Gretta Cool, BSN, RN, CWS, Kim RN, BSN Entered By: Gretta Cool, BSN, RN, CWS, Kim on 10/01/2019 13:03:55 [name], [name] ([id number]) -------------------------------------------------------------------------------- Clinic Level of Care Assessment Details Patient Name: [name], Cassandra M. Date of Service: 09/25/2019 12:45 PM Medical Record Number: [id number] Patient Account Number: [id number] Date of Birth/Sex: [birth year] (86 y.o. F) Treating RN: Cornell Barman Primary Care Termaine Roupp: Myrtie Hawk Other Clinician: Referring Aleksa Collinsworth: Referral, Self Treating Caliope Ruppert/Extender: Tito Dine in Treatment: 0 Clinic Level of Care Assessment Items TOOL 2 Quantity Score []  - Use when only an EandM is performed on the INITIAL visit 0 ASSESSMENTS - Nursing Assessment / Reassessment X - General Physical Exam (combine w/ comprehensive assessment (listed just below) when performed on new 1 20 pt. evals) X- 1 25 Comprehensive Assessment (HX, ROS, Risk Assessments, Wounds Hx, etc.) ASSESSMENTS - Wound and Skin Assessment / Reassessment []  - Simple Wound Assessment / Reassessment - one wound 0 []  - 0 Complex Wound Assessment / Reassessment - multiple wounds X- 1 10 Dermatologic / Skin Assessment (not related to wound area) ASSESSMENTS - Ostomy and/or Continence Assessment and Care []  - Incontinence Assessment and Management 0 []  - 0 Ostomy Care Assessment and Management (repouching, etc.) PROCESS - Coordination of Care []  - Simple Patient / Family Education for ongoing care 0 []  - 0 Complex (extensive) Patient / Family Education for ongoing care []  - 0 Staff obtains Programmer, systems, Records, Test Results / Process Orders []  - 0 Staff telephones HHA, Nursing Homes / Clarify orders / etc []  -  0 Routine Transfer to another Facility (non-emergent condition) []  - 0 Routine Hospital Admission (non-emergent condition) []  - 0 New Admissions / Biomedical engineer / Ordering NPWT, Apligraf, etc. []  - 0 Emergency Hospital Admission (emergent condition) []  - 0 Simple Discharge Coordination []  - 0  Complex (extensive) Discharge Coordination PROCESS - Special Needs []  - Pediatric / Minor Patient Management 0 []  - 0 Isolation Patient Management []  - 0 Hearing / Language / Visual special needs []  - 0 Assessment of Community assistance (transportation, D/C planning, etc.) []  - 0 Additional assistance / Altered mentation []  - 0 Support Surface(s) Assessment (bed, cushion, seat, etc.) INTERVENTIONS - Wound Cleansing / Measurement []  - Wound Imaging (photographs - any number of wounds) 0 []  - 0 Wound Tracing (instead of photographs) []  - 0 Simple Wound Measurement - one wound []  - 0 Complex Wound Measurement - multiple wounds [name], Cassandra M. ([id number]) []  - 0 Simple Wound Cleansing - one wound []  - 0 Complex Wound Cleansing - multiple wounds INTERVENTIONS - Wound Dressings []  - Small Wound Dressing one or multiple wounds 0 []  - 0 Medium Wound Dressing one or multiple wounds []  - 0 Large Wound Dressing one or multiple wounds []  - 0 Application of Medications - injection INTERVENTIONS - Miscellaneous []  - External ear exam 0 []  - 0 Specimen Collection (cultures, biopsies, blood, body fluids, etc.) []  - 0 Specimen(s) / Culture(s) sent or taken to Lab for analysis []  - 0 Patient Transfer (multiple staff / Civil Service fast streamer / Similar devices) []  - 0 Simple Staple / Suture removal (25 or less) []  - 0 Complex Staple / Suture removal (26 or more) []  - 0 Hypo / Hyperglycemic Management (close monitor of Blood Glucose) []  - 0 Ankle / Brachial Index (ABI) - do not check if billed separately Has the patient been seen at the hospital within the last three years:  Yes Total Score: 55 Level Of Care: New/Established - Level 2 Electronic Signature(s) Signed: 09/25/2019 4:56:00 PM By: Gretta Cool, BSN, RN, CWS, Kim RN, BSN Entered By: Gretta Cool, BSN, RN, CWS, Kim on 09/25/2019 13:49:22 [name], [name] ([id number]) -------------------------------------------------------------------------------- Encounter Discharge Information Details Patient Name: [name], Cassandra M. Date of Service: 09/25/2019 12:45 PM Medical Record Number: [id number] Patient Account Number: [id number] Date of Birth/Sex: 1924-09-23 (86 y.o. F) Treating RN: Cornell Barman Primary Care Mert Dietrick: Myrtie Hawk Other Clinician: Referring Tyshon Fanning: Referral, Self Treating Amora Sheehy/Extender: Tito Dine in Treatment: 0 Encounter Discharge Information Items Discharge Condition: Stable Ambulatory Status: Wheelchair Discharge Destination: Skilled Nursing Facility Telephoned: No Orders Sent: No Transportation: Private Auto Accompanied By: daughter and caregiver Schedule Follow-up Appointment: Yes Clinical Summary of Care: Electronic Signature(s) Signed: 09/25/2019 2:39:03 PM By: Gretta Cool, BSN, RN, CWS, Kim RN, BSN Previous Signature: 09/25/2019 1:52:33 PM Version By: Gretta Cool, BSN, RN, CWS, Kim RN, BSN Entered By: Gretta Cool, BSN, RN, CWS, Kim on 09/25/2019 14:39:02 [name], [name] ([id number]) -------------------------------------------------------------------------------- Lower Extremity Assessment Details Patient Name: [name], Cassandra M. Date of Service: 09/25/2019 12:45 PM Medical Record Number: [id number] Patient Account Number: [id number] Date of Birth/Sex: 1924/06/16 (86 y.o. F) Treating RN: Cornell Barman Primary Care Stephone Gum: Myrtie Hawk Other Clinician: Referring Dammon Makarewicz: Referral, Self Treating Yarithza Mink/Extender: Ricard Dillon Weeks in Treatment: 0 Edema Assessment Assessed: [Left: No] [Right: No] Edema: [Left: Ye] [Right:  s] Electronic Signature(s) Signed: 09/30/2019 4:57:27 PM By: Gretta Cool, BSN, RN, CWS, Kim RN, BSN Previous Signature: 09/25/2019 4:56:00 PM Version By: Gretta Cool, BSN, RN, CWS, Kim RN, BSN Entered By: Gretta Cool, BSN, RN, CWS, Kim on 09/30/2019 16:57:27 [name], [name] ([id number]) -------------------------------------------------------------------------------- Multi Wound Chart Details Patient Name: [name], Cassandra M. Date of Service: 09/25/2019 12:45 PM Medical Record Number: [id number] Patient Account Number: [id number] Date of Birth/Sex: 28-Nov-1924 (86 y.o. F) Treating RN: Gretta Cool,  [place] Primary Care Alline Pio: Myrtie Hawk Other Clinician: Referring Javonni Macke: Referral, Self Treating Jireh Vinas/Extender: Tito Dine in Treatment: 0 Vital Signs Height(in): 60 Pulse(bpm): 75 Weight(lbs): 187 Blood Pressure(mmHg): 126/65 Body Mass Index(BMI): 37 Temperature(F): 98.5 Respiratory Rate(breaths/min): 16 Wound Assessments Treatment Notes Electronic Signature(s) Signed: 09/25/2019 1:47:21 PM By: Gretta Cool, BSN, RN, CWS, Kim RN, BSN Entered By: Gretta Cool, BSN, RN, CWS, Kim on 09/25/2019 13:47:20 [name], [name] ([id number]) -------------------------------------------------------------------------------- [place] Details Patient Name: [name], [name] M. Date of Service: 09/25/2019 12:45 PM Medical Record Number: [id number] Patient Account Number: [id number] Date of Birth/Sex: 05-01-1924 (86 y.o. F) Treating RN: Cornell Barman Primary Care Germaine Shenker: Myrtie Hawk Other Clinician: Referring Mathayus Stanbery: Referral, Self Treating Khira Cudmore/Extender: Tito Dine in Treatment: 0 Active Inactive Electronic Signature(s) Signed: 09/30/2019 4:57:48 PM By: Gretta Cool, BSN, RN, CWS, Kim RN, BSN Previous Signature: 09/25/2019 1:47:13 PM Version By: Gretta Cool, BSN, RN, CWS, Kim RN, BSN Entered By: Gretta Cool, BSN, RN, CWS, Kim on 09/30/2019  16:57:48 [place], [name] ([id number]) -------------------------------------------------------------------------------- Pain Assessment Details Patient Name: [name], Tyshia M. Date of Service: 09/25/2019 12:45 PM Medical Record Number: [id number] Patient Account Number: [id number] Date of Birth/Sex: December 02, 1924 (86 y.o. F) Treating RN: Cornell Barman Primary Care Dekari Bures: Myrtie Hawk Other Clinician: Referring Janiyha Montufar: Referral, Self Treating Keyly Baldonado/Extender: Tito Dine in Treatment: 0 Active Problems Location of Pain Severity and Description of Pain Patient Has Paino No Site Locations Pain Management and Medication Current Pain Management: Electronic Signature(s) Signed: 09/30/2019 4:58:32 PM By: Gretta Cool, BSN, RN, CWS, Kim RN, BSN Previous Signature: 09/25/2019 4:56:00 PM Version By: Gretta Cool, BSN, RN, CWS, Kim RN, BSN Entered By: Gretta Cool, BSN, RN, CWS, Kim on 09/30/2019 16:58:32 [place], [name] ([id number]) -------------------------------------------------------------------------------- Patient/Caregiver Education Details Patient Name: [name], Jasie M. Date of Service: 09/25/2019 12:45 PM Medical Record Number: [id number] Patient Account Number: [id number] Date of Birth/Gender: 01-Dec-1924 (86 y.o. F) Treating RN: Cornell Barman Primary Care Physician: Myrtie Hawk Other Clinician: Referring Physician: Referral, Self Treating Physician/Extender: Tito Dine in Treatment: 0 Education Assessment Education Provided To: Caregiver Education Topics Provided Venous: Handouts: Controlling Swelling with Compression Stockings Methods: Demonstration, Explain/Verbal Responses: State content correctly Electronic Signature(s) Signed: 09/25/2019 4:56:00 PM By: Gretta Cool, BSN, RN, CWS, Kim RN, BSN Entered By: Gretta Cool, BSN, RN, CWS, Kim on 09/25/2019 13:50:56 [name], [name]  ([id number]) -------------------------------------------------------------------------------- [place] Details Patient Name: [name], Ailyne M. Date of Service: 09/25/2019 12:45 PM Medical Record Number: [id number] Patient Account Number: [id number] Date of Birth/Sex: 11-30-1924 (86 y.o. F) Treating RN: Cornell Barman Primary Care Anila Bojarski: Myrtie Hawk Other Clinician: Referring Stavros Cail: Referral, Self Treating Jordell Outten/Extender: Tito Dine in Treatment: 0 Vital Signs Time Taken: 12:50 Temperature (F): 98.5 Height (in): 60 Pulse (bpm): 75 Source: Stated Respiratory Rate (breaths/min): 16 Weight (lbs): 187 Blood Pressure (mmHg): 126/65 Source: Stated Reference Range: 80 - 120 mg / dl Body Mass Index (BMI): 36.5 Electronic Signature(s) Signed: 09/30/2019 5:08:14 PM By: Gretta Cool, BSN, RN, CWS, Kim RN, BSN Entered By: Gretta Cool, BSN, RN, CWS, Kim on 09/30/2019 17:08:14

## 2019-09-30 NOTE — Progress Notes (Signed)
[name], [name] ([id number]) Visit Report for 09/25/2019 Abuse/Suicide Risk Screen Details Patient Name: [name], [name]. Date of Service: 09/25/2019 12:45 PM Medical Record Number: [id number] Patient Account Number: [id number] Date of Birth/Sex: 06/01/1924 (86 y.o. F) Treating RN: Cornell Barman Primary Care Lola Lofaro: Myrtie Hawk Other Clinician: Referring Caillou Minus: Referral, Self Treating Shalene Gallen/Extender: Tito Dine in Treatment: 0 Abuse/Suicide Risk Screen Items Answer ABUSE RISK SCREEN: Has anyone close to you tried to hurt or harm you recentlyo No Do you feel uncomfortable with anyone in your familyo No Has anyone forced you do things that you didnot want to doo No Electronic Signature(s) Signed: 09/30/2019 4:38:47 PM By: Gretta Cool, BSN, RN, CWS, Kim RN, BSN Previous Signature: 09/25/2019 4:56:00 PM Version By: Gretta Cool, BSN, RN, CWS, Kim RN, BSN Entered By: Gretta Cool, BSN, RN, CWS, Kim on 09/30/2019 16:38:47 [place], [name] ([id number]) -------------------------------------------------------------------------------- Activities of Daily Living Details Patient Name: [name], Cassandra M. Date of Service: 09/25/2019 12:45 PM Medical Record Number: [id number] Patient Account Number: [id number] Date of Birth/Sex: [birth year] (86 y.o. F) Treating RN: Cornell Barman Primary Care Malana Eberwein: Myrtie Hawk Other Clinician: Referring Arias Weinert: Referral, Self Treating Michah Minton/Extender: Tito Dine in Treatment: 0 Activities of Daily Living Items Answer Activities of Daily Living (Please select one for each item) Drive Automobile Not Able Take Medications Not Able Use Telephone Not Able Care for Appearance Not Able Use Toilet Not Able Nurse, children's / Shower Need Assistance Dress Self Not Able Feed Self Not Able Walk Not Able Get In / Out Bed Not Able Housework Not Able Prepare Meals Need Assistance Handle Money Not Able Shop for Self  Not Able Electronic Signature(s) Signed: 09/30/2019 4:39:02 PM By: Gretta Cool, BSN, RN, CWS, Kim RN, BSN Previous Signature: 09/25/2019 4:56:00 PM Version By: Gretta Cool, BSN, RN, CWS, Kim RN, BSN Entered By: Gretta Cool, BSN, RN, CWS, Kim on 09/30/2019 16:39:02 [name], [name] ([id number]) -------------------------------------------------------------------------------- Education Screening Details Patient Name: [name], Cassandra M. Date of Service: 09/25/2019 12:45 PM Medical Record Number: [id number] Patient Account Number: [id number] Date of Birth/Sex: 07-Mar-1924 (86 y.o. F) Treating RN: Cornell Barman Primary Care Taliya Mcclard: Myrtie Hawk Other Clinician: Referring Alexander Mcauley: Referral, Self Treating Arnol Mcgibbon/Extender: Tito Dine in Treatment: 0 Primary Learner Assessed: Caregiver daughter Learning Preferences/Education Level/Primary Language Learning Preference: Explanation, Demonstration, Printed Material Preferred Language: English Cognitive Barrier Language Barrier: No Translator Needed: No Memory Deficit: Yes Emotional Barrier: No Cultural/Religious Beliefs Affecting Medical Care: No Physical Barrier Impaired Vision: No Impaired Hearing: No Decreased Hand dexterity: No Knowledge/Comprehension Knowledge Level: Medium Comprehension Level: Medium Ability to understand written instructions: High Ability to understand verbal instructions: High Motivation Anxiety Level: Calm Cooperation: Cooperative Education Importance: Acknowledges Need Interest in Health Problems: Asks Questions Perception: Coherent Willingness to Engage in Self-Management High Activities: Readiness to Engage in Self-Management High Activities: Electronic Signature(s) Signed: 09/30/2019 4:39:25 PM By: Gretta Cool, BSN, RN, CWS, Kim RN, BSN Previous Signature: 09/25/2019 4:56:00 PM Version By: Gretta Cool, BSN, RN, CWS, Kim RN, BSN Entered By: Gretta Cool, BSN, RN, CWS, Kim on 09/30/2019 16:39:24 [place], [name] ([id number]) -------------------------------------------------------------------------------- Fall Risk Assessment Details Patient Name: [name], Cassandra M. Date of Service: 09/25/2019 12:45 PM Medical Record Number: [id number] Patient Account Number: [id number] Date of Birth/Sex: 1924/11/18 (86 y.o. F) Treating RN: Cornell Barman Primary Care Lindon Kiel: Myrtie Hawk Other Clinician: Referring Vaneza Pickart: Referral, Self Treating Rodman Recupero/Extender: Tito Dine in Treatment: 0 Fall Risk Assessment Items Have you had 2 or more falls in the last 12 monthso  0 No Have you had any fall that resulted in injury in the last 12 monthso 0 No FALLS RISK SCREEN History of falling - immediate or within 3 months 0 No Secondary diagnosis (Do you have 2 or more medical diagnoseso) 0 No Ambulatory aid None/bed rest/wheelchair/nurse 0 Yes Crutches/cane/walker 0 No Furniture 0 No Intravenous therapy Access/Saline/Heparin Lock 0 No Gait/Transferring Normal/ bed rest/ wheelchair 0 Yes Weak (short steps with or without shuffle, stooped but able to lift head while walking, may 0 No seek support from furniture) Impaired (short steps with shuffle, may have difficulty arising from chair, head down, impaired 20 Yes balance) Mental Status Oriented to own ability 0 No Electronic Signature(s) Signed: 09/30/2019 5:21:07 PM By: Gretta Cool, BSN, RN, CWS, Kim RN, BSN Previous Signature: 09/25/2019 4:56:00 PM Version By: Gretta Cool, BSN, RN, CWS, Kim RN, BSN Entered By: Gretta Cool, BSN, RN, CWS, Kim on 09/30/2019 17:21:07 [name], [name] ([id number]) -------------------------------------------------------------------------------- Nutrition Risk Screening Details Patient Name: [name], Cassandra M. Date of Service: 09/25/2019 12:45 PM Medical Record Number: [id number] Patient Account Number: [id number] Date of Birth/Sex: 1924/04/28 (86 y.o. F) Treating RN: Cornell Barman Primary Care  Sarahlynn Cisnero: Myrtie Hawk Other Clinician: Referring Alyah Boehning: Referral, Self Treating Lan Mcneill/Extender: Tito Dine in Treatment: 0 Height (in): 60 Weight (lbs): 187 Body Mass Index (BMI): 36.5 Nutrition Risk Screening Items Score Screening NUTRITION RISK SCREEN: I have an illness or condition that made me change the kind and/or amount of food I eat 0 No I eat fewer than two meals per day 0 No I eat few fruits and vegetables, or milk products 0 No I have three or more drinks of beer, liquor or wine almost every day 0 No I have tooth or mouth problems that make it hard for me to eat 0 No I don't always have enough money to buy the food I need 0 No I eat alone most of the time 0 No I take three or more different prescribed or over-the-counter drugs a day 0 No Without wanting to, I have lost or gained 10 pounds in the last six months 0 No I am not always physically able to shop, cook and/or feed myself 0 No Nutrition Protocols Good Risk Protocol 0 No interventions needed Moderate Risk Protocol High Risk Proctocol Risk Level: Good Risk Score: 0 Electronic Signature(s) Signed: 09/30/2019 4:58:13 PM By: Gretta Cool, BSN, RN, CWS, Kim RN, BSN Previous Signature: 09/25/2019 4:56:00 PM Version By: Gretta Cool, BSN, RN, CWS, Kim RN, BSN Entered By: Gretta Cool, BSN, RN, CWS, Kim on 09/30/2019 16:58:13

## 2019-09-30 NOTE — Telephone Encounter (Signed)
Beth from [facility] went to visit patient and is reporting that there was a request about nursing for a wound. Pt doesn't have any wounds. She has two healed blisters. Pt doesn't need nursing.

## 2019-09-30 NOTE — Telephone Encounter (Signed)
Does she need nursing for medication help? Ask daughter crystal  If not ok to cancel nursing with [facility] verbal  Thank  [place]

## 2019-09-30 NOTE — Progress Notes (Signed)
Subjective:    Patient ID: [name], female    DOB: 05/30/1924, 86 y.o.   MRN: [id number] Chief Complaint  Patient presents with  . Follow-up    ultrasound follow up    The patient presents today for follow-up regarding her ambulation.  The patient's family notes that recently she was able to transfer without issue to the bathroom however recently she has begun shuffling in her left lower extremity has begun to have weakness.  There is also some concern for discoloration of her foot.  She denies any claudication-like symptoms however she does not ambulate much.  There are no rest pain like symptoms.  No lower extremity ulcerations.  The patient's family provides most of the history today.  The patient is mostly somnolent.  Today noninvasive studies show noncompressible ABIs bilaterally however a digit pressure of 93 on the right and 111 on the left.  The patient has strong biphasic waveforms in the right lower extremity with strong monophasic/biphasic waveforms in the left lower extremity.  The toe waveforms are strong bilaterally.  The studies are essentially unchanged compared to the previous study on 04/30/2018.   Review of Systems  Cardiovascular: Positive for leg swelling.  Neurological: Positive for weakness.  All other systems reviewed and are negative.      Objective:   Physical Exam Vitals reviewed.  HENT:     Head: Normocephalic.  Cardiovascular:     Rate and Rhythm: Normal rate.     Pulses: Decreased pulses.  Pulmonary:     Effort: Pulmonary effort is normal.  Musculoskeletal:     Right lower leg: Edema present.     Left lower leg: Edema present.  Skin:    General: Skin is warm.  Neurological:     Mental Status: She is alert and oriented to person, place, and time. Mental status is at baseline.     Motor: Weakness present.     Coordination: Coordination abnormal.     Gait: Gait abnormal.     BP 125/78 (BP Location: Right Arm)   Pulse 75   Resp  16   Past Medical History:  Diagnosis Date  . Anemia   . Carotid arterial disease ([place])    a. 08/2014 U/S: Bilateral < 50% stenosis. Patent vertebrals w/ antegrade flow.   . Cervical spine arthritis   . Chronic diastolic CHF (congestive heart failure) ([place])    a. echo 2014: EF 55-60%, DD;  b. 08/2014 Echo: EF 55-60%, no RWMA, GR1DD; c. 02/2016 Echo: EF 65-70%, Gr1 DD.  Marland Kitchen Cognitive impairment   . Depression   . Diabetes mellitus without complication ([place])   . Essential hypertension   . GERD (gastroesophageal reflux disease)   . Glaucoma   . Gout   . History of renal impairment   . Hyperlipidemia   . Hypotension    a. Related to Norvasc - 11/2014 ED visit.  . Multinodular goiter    a. Noted incidentally on CT 07/2012 and carotid U/S 08/2014;  b. Nl TSH 11/2014.  Marland Kitchen Paroxysmal SVT (supraventricular tachycardia) (HCC)    a. on Toprol   . Prolapsed uterus   . Severe aortic stenosis    a. 11/15/16: s/p TAVR Edwards Sapien 3 THV (size 23 mm, model # 9600TFX, serial # [id number])  . Stroke [facility])    a. CT head 07/2014 showed small thalamic infarct  . Vitamin deficiency     Social History   Socioeconomic History  . Marital status: Widowed  Spouse name: Not on file  . Number of children: Not on file  . Years of education: Not on file  . Highest education level: Not on file  Occupational History  . Occupation: retired  Tobacco Use  . Smoking status: Never Smoker  . Smokeless tobacco: Never Used  Vaping Use  . Vaping Use: Never used  Substance and Sexual Activity  . Alcohol use: No  . Drug use: No  . Sexual activity: Not Currently  Other Topics Concern  . Not on file  Social History Narrative   Lives at home    11 kids    Social Determinants of Health   Financial Resource Strain:   . Difficulty of Paying Living Expenses:   Food Insecurity:   . Worried About Charity fundraiser in the Last Year:   . Arboriculturist in the Last Year:   Transportation Needs:   . Lexicographer (Medical):   Marland Kitchen Lack of Transportation (Non-Medical):   Physical Activity:   . Days of Exercise per Week:   . Minutes of Exercise per Session:   Stress:   . Feeling of Stress :   Social Connections:   . Frequency of Communication with Friends and Family:   . Frequency of Social Gatherings with Friends and Family:   . Attends Religious Services:   . Active Member of Clubs or Organizations:   . Attends Archivist Meetings:   Marland Kitchen Marital Status:   Intimate Partner Violence:   . Fear of Current or Ex-Partner:   . Emotionally Abused:   Marland Kitchen Physically Abused:   . Sexually Abused:     Past Surgical History:  Procedure Laterality Date  . EYE SURGERY     unsure of what procedure, did know that laser was used  . IR RADIOLOGY PERIPHERAL GUIDED IV START  11/03/2016  . IR US GUIDE VASC ACCESS RIGHT  11/03/2016  . RIGHT/LEFT HEART CATH AND CORONARY ANGIOGRAPHY N/A 10/26/2016   Procedure: RIGHT/LEFT HEART CATH AND CORONARY ANGIOGRAPHY;  Surgeon: [name], MD;  Location: [facility];  Service: Cardiovascular;  Laterality: N/A;  . TEE WITHOUT CARDIOVERSION N/A 03/31/2016   Procedure: TRANSESOPHAGEAL ECHOCARDIOGRAM (TEE);  Surgeon: Minna Merritts, MD;  Location: [facility];  Service: Cardiovascular;  Laterality: N/A;  . TEE WITHOUT CARDIOVERSION N/A 11/15/2016   Procedure: TRANSESOPHAGEAL ECHOCARDIOGRAM (TEE);  Surgeon: [name], MD;  Location: [place];  Service: Open Heart Surgery;  Laterality: N/A;  . TRANSCATHETER AORTIC VALVE REPLACEMENT, TRANSFEMORAL N/A 11/15/2016   Procedure: TRANSCATHETER AORTIC VALVE REPLACEMENT, TRANSFEMORAL;  Surgeon: [name], MD;  Location: [place];  Service: Open Heart Surgery;  Laterality: N/A;  . TUBAL LIGATION      Family History  Problem Relation Age of Onset  . Glaucoma Mother   . Diabetes Mother   . Peptic Ulcer Father   . Colitis Father     Allergies  Allergen Reactions  . Metformin Hcl Other (See Comments)    GI  upset  . Metformin Other (See Comments), Diarrhea and Nausea And Vomiting    Other reaction(s): Other (See Comments), Unknown  . Metformin And Related Diarrhea       Assessment & Plan:   1. PAD (peripheral artery disease) (HCC)  Recommend:  The patient has evidence of atherosclerosis of the lower extremities with claudication.  The patient does not voice lifestyle limiting changes at this point in time.  Noninvasive studies do not suggest clinically significant change.  No invasive studies, angiography or surgery at this time The patient should continue walking and begin a more formal exercise program.  The patient should continue antiplatelet therapy and aggressive treatment of the lipid abnormalities  No changes in the patient's medications at this time  The patient should continue wearing graduated compression socks 10-15 mmHg strength to control the mild edema.    2. Essential hypertension Continue antihypertensive medications as already ordered, these medications have been reviewed and there are no changes at this time.   3. Hyperlipidemia, unspecified hyperlipidemia type Continue statin as ordered and reviewed, no changes at this time   4. Lymphedema No surgery or intervention at this point in time.  I have reviewed my discussion with the patient regarding venous insufficiency and why it causes symptoms. I have discussed with the patient the chronic skin changes that accompany venous insufficiency and the long term sequela such as ulceration. Patient will contnue wearing graduated compression stockings on a daily basis, as this has provided excellent control of his edema. The patient will put the stockings on first thing in the morning and removing them in the evening. The patient is reminded not to sleep in the stockings.  In addition, behavioral modification including elevation during the day will be initiated. Exercise is strongly encouraged.   5. Weakness of left  lower extremity Weakness is likely musculoskeletal/neurological.  Patient is advised to follow-up with palliative care/PCP for possible physical therapy.   Current Outpatient Medications on File Prior to Visit  Medication Sig Dispense Refill  . ACCU-CHEK AVIVA PLUS test strip USE 1 STRIP TO TEST BLOOD SUGARS 3 TIMES A DAY 300 each 4  . acetaminophen (TYLENOL) 500 MG tablet Take 1 tablet (500 mg total) by mouth every 6 (six) hours as needed for mild pain or headache. 360 tablet 3  . allopurinol (ZYLOPRIM) 100 MG tablet Take 1 tablet (100 mg total) by mouth daily. 90 tablet 3  . aspirin 81 MG chewable tablet Chew 1 tablet (81 mg total) by mouth daily. 90 tablet 3  . blood glucose meter kit and supplies Dispense based on patient and insurance preference. Use 1x daily as directed. (FOR ICD-10 E10.9, E11.9). 1 each 0  . brimonidine (ALPHAGAN P) 0.1 % SOLN Place 1 drop into the right eye 2 (two) times daily.    . Carboxymethylcellulose Sodium (ARTIFICIAL TEARS OP) Apply 1 drop to eye 2 (two) times daily. Left eye    . cholecalciferol (VITAMIN D) 25 MCG (1000 UT) tablet Take 1 tablet (1,000 Units total) by mouth daily. 90 tablet 3  . clotrimazole (LOTRIMIN) 1 % cream APPLY TOPICALLY TO THE AFFECTED AREA ONCE A DAY AT BEDTIME FOR 7-14 DAYS 60 g 11  . Continuous Blood Gluc Receiver (FREESTYLE LIBRE 14 DAY READER) DEVI 1 Device by Does not apply route every 14 (fourteen) days. 2 each 11  . Continuous Blood Gluc Sensor (FREESTYLE LIBRE 14 DAY SENSOR) MISC 1 Device by Does not apply route every 14 (fourteen) days. 2 each 11  . dimethicone (PROSHIELD PLUS SKIN PROTECTANT) 1 % cream 1 application to the buttock 2 times daily to help protect skin 114 g 11  . Dorzolamide HCl-Timolol Mal (COSOPT OP) Place 1 drop into the right eye 2 (two) times daily.     . fluticasone (FLONASE) 50 MCG/ACT nasal spray Place 1 spray into both nostrils daily. Prn max 2 sprays total each nose 16 g 11  . gabapentin (NEURONTIN) 100 MG  capsule TAKE 1 CAPSULE BY  MOUTH EVERY DAY 1 HOUR BEFORE BEDTIME 30 capsule 0  . gabapentin (NEURONTIN) 300 MG capsule Take 1 capsule (300 mg total) by mouth 2 (two) times daily. 180 capsule 3  . glucose blood test strip Accu-Chek Aviva Plus test strips 300 each 4  . glucose blood test strip qid Use as instructed E11.9 340 each 3  . insulin aspart (NOVOLOG FLEXPEN) 100 UNIT/ML FlexPen TID before meals 15-30 minutes based on sugar reading before meals If sugar: 70-130 0 units, 131-180 2 units, 181-240 4 units, 241-300 6 units, 301-350 8 units, 351-400 10 units, >400 12 units and call doctor 15 mL 11  . insulin degludec (TRESIBA FLEXTOUCH) 100 UNIT/ML FlexTouch Pen Inject 0.1 mLs (10 Units total) into the skin daily. 5 pen 11  . Insulin Pen Needle (PEN NEEDLES) 30G X 8 MM MISC 1 Device by Does not apply route once a week. 30 each 11  . Insulin Pen Needle (PEN NEEDLES) 30G X 8 MM MISC 1 Device by Does not apply route 3 (three) times daily before meals. 270 each 3  . Insulin Pen Needle (PEN NEEDLES) 31G X 8 MM MISC 1 pen by Does not apply route daily. 90 each 3  . Lancet Device MISC 1 Device by Does not apply route as directed. 1 each 0  . Lancets (ONETOUCH ULTRASOFT) lancets Bid E 11.9 Use as instructed 200 each 12  . latanoprost (XALATAN) 0.005 % ophthalmic solution Place 1 drop into the right eye at bedtime.     Marland Kitchen LORazepam (ATIVAN) 1 MG tablet Take 1 tablet (1 mg total) by mouth at bedtime as needed for anxiety. 30 tablet 2  . lovastatin (MEVACOR) 20 MG tablet Take 1 tablet (20 mg total) by mouth at bedtime. Note reduced dose due to age and chronic medical issues d/c 40 mg dose 90 tablet 3  . metoprolol succinate (TOPROL-XL) 25 MG 24 hr tablet Take 1 tablet (25 mg total) by mouth daily. 90 tablet 3  . metroNIDAZOLE (METROGEL) 0.75 % vaginal gel Place 1 Applicatorful vaginally 2 (two) times daily. Prn BV 70 g 2  . Multiple Vitamin (MULTI-VITAMIN PO) Take 1 tablet by mouth daily.    . mupirocin  ointment (BACTROBAN) 2 % Apply 1 application topically 2 (two) times daily. To open sores as needed 30 g 2  . mupirocin ointment (BACTROBAN) 2 % Apply topically 2 (two) times daily. 30 g 2  . ONETOUCH DELICA LANCETS 37C MISC USE TO TEST BLOOD SUGAR ONCE A DAY  0  . pantoprazole (PROTONIX) 40 MG tablet Take 1 tablet (40 mg total) by mouth daily. 30 minutes before breakfast OR dinner 90 tablet 3  . pilocarpine (PILOCAR) 4 % ophthalmic solution Place 1 drop into the right eye 4 (four) times daily.     . polyethylene glycol (MIRALAX) packet Take 17 g by mouth daily. prn 30 each 11  . Potassium Chloride ER 20 MEQ TBCR Take 20 mEq by mouth as directed. Take once daily and extra pill with any extra torsemide (Patient taking differently: Take 20 mEq by mouth as directed. Take once daily and extra pill with any extra torsemide) 180 tablet 3  . Semaglutide,0.25 or 0.'5MG'$ /DOS, (OZEMPIC, 0.25 OR 0.5 MG/DOSE,) 2 MG/1.5ML SOPN Inject 0.25 mg into the skin once a week. 5 pen 11  . sertraline (ZOLOFT) 50 MG tablet Take 1 tablet (50 mg total) by mouth daily. 90 tablet 3  . sitaGLIPtin (JANUVIA) 25 MG tablet Take 1 tablet (25 mg total)  by mouth daily. D/c 50 mg d/c glipizide 2.5 mg daily 90 tablet 3  . spironolactone (ALDACTONE) 25 MG tablet Take 25 mg by mouth daily.    Marland Kitchen torsemide (DEMADEX) 20 MG tablet Take 2 tablets (40 mg total) by mouth as directed. Take 40MG by mouth every day with extra 40MG after lunch for weight greater than 180 lbs (Patient taking differently: Take 40 mg by mouth 2 (two) times daily. Take 40MG by mouth every day with extra 40MG after lunch for weight greater than 180 lbs) 360 tablet 3  . traZODone (DESYREL) 100 MG tablet Take 0.5-1 tablets (50-100 mg total) by mouth at bedtime as needed. 90 tablet 3  . triamcinolone cream (KENALOG) 0.1 % Apply 1 application topically 2 (two) times daily. Prn legs 80 g 0  . vitamin C (ASCORBIC ACID) 500 MG tablet Take 1 tablet (500 mg total) by mouth daily. 90  tablet 3  . Zinc Oxide 10 % OINT Apply 1 application topically daily as needed (rash). Applied to patient's bottom 226.8 g 11   No current facility-administered medications on file prior to visit.    There are no Patient Instructions on file for this visit. No follow-ups on file.   Kris Hartmann, NP

## 2019-10-01 DIAGNOSIS — M2041 Other hammer toe(s) (acquired), right foot: Secondary | ICD-10-CM | POA: Diagnosis not present

## 2019-10-01 DIAGNOSIS — N1330 Unspecified hydronephrosis: Secondary | ICD-10-CM | POA: Diagnosis not present

## 2019-10-01 DIAGNOSIS — M2042 Other hammer toe(s) (acquired), left foot: Secondary | ICD-10-CM | POA: Diagnosis not present

## 2019-10-01 DIAGNOSIS — E1142 Type 2 diabetes mellitus with diabetic polyneuropathy: Secondary | ICD-10-CM | POA: Diagnosis not present

## 2019-10-01 DIAGNOSIS — R918 Other nonspecific abnormal finding of lung field: Secondary | ICD-10-CM | POA: Diagnosis not present

## 2019-10-01 DIAGNOSIS — I700 Atherosclerosis of aorta: Secondary | ICD-10-CM | POA: Diagnosis not present

## 2019-10-01 DIAGNOSIS — M47817 Spondylosis without myelopathy or radiculopathy, lumbosacral region: Secondary | ICD-10-CM | POA: Diagnosis not present

## 2019-10-01 DIAGNOSIS — I872 Venous insufficiency (chronic) (peripheral): Secondary | ICD-10-CM | POA: Diagnosis not present

## 2019-10-01 DIAGNOSIS — M810 Age-related osteoporosis without current pathological fracture: Secondary | ICD-10-CM | POA: Diagnosis not present

## 2019-10-01 DIAGNOSIS — I6523 Occlusion and stenosis of bilateral carotid arteries: Secondary | ICD-10-CM | POA: Diagnosis not present

## 2019-10-01 DIAGNOSIS — K219 Gastro-esophageal reflux disease without esophagitis: Secondary | ICD-10-CM | POA: Diagnosis not present

## 2019-10-01 DIAGNOSIS — E042 Nontoxic multinodular goiter: Secondary | ICD-10-CM | POA: Diagnosis not present

## 2019-10-01 DIAGNOSIS — I130 Hypertensive heart and chronic kidney disease with heart failure and stage 1 through stage 4 chronic kidney disease, or unspecified chronic kidney disease: Secondary | ICD-10-CM | POA: Diagnosis not present

## 2019-10-01 DIAGNOSIS — E669 Obesity, unspecified: Secondary | ICD-10-CM | POA: Diagnosis not present

## 2019-10-01 DIAGNOSIS — G500 Trigeminal neuralgia: Secondary | ICD-10-CM | POA: Diagnosis not present

## 2019-10-01 DIAGNOSIS — I5032 Chronic diastolic (congestive) heart failure: Secondary | ICD-10-CM | POA: Diagnosis not present

## 2019-10-01 DIAGNOSIS — I152 Hypertension secondary to endocrine disorders: Secondary | ICD-10-CM | POA: Diagnosis not present

## 2019-10-01 DIAGNOSIS — M47812 Spondylosis without myelopathy or radiculopathy, cervical region: Secondary | ICD-10-CM | POA: Diagnosis not present

## 2019-10-01 DIAGNOSIS — E1122 Type 2 diabetes mellitus with diabetic chronic kidney disease: Secondary | ICD-10-CM | POA: Diagnosis not present

## 2019-10-01 DIAGNOSIS — E1151 Type 2 diabetes mellitus with diabetic peripheral angiopathy without gangrene: Secondary | ICD-10-CM | POA: Diagnosis not present

## 2019-10-01 DIAGNOSIS — G3184 Mild cognitive impairment, so stated: Secondary | ICD-10-CM | POA: Diagnosis not present

## 2019-10-01 DIAGNOSIS — E785 Hyperlipidemia, unspecified: Secondary | ICD-10-CM | POA: Diagnosis not present

## 2019-10-01 DIAGNOSIS — M1712 Unilateral primary osteoarthritis, left knee: Secondary | ICD-10-CM | POA: Diagnosis not present

## 2019-10-01 DIAGNOSIS — N184 Chronic kidney disease, stage 4 (severe): Secondary | ICD-10-CM | POA: Diagnosis not present

## 2019-10-01 DIAGNOSIS — M48061 Spinal stenosis, lumbar region without neurogenic claudication: Secondary | ICD-10-CM | POA: Diagnosis not present

## 2019-10-02 ENCOUNTER — Telehealth: Payer: Self-pay | Admitting: Internal Medicine

## 2019-10-02 NOTE — Telephone Encounter (Signed)
Spoke with Patient's daughter and they do not have that medication in their home for the Patient. She is not taking this

## 2019-10-02 NOTE — Telephone Encounter (Signed)
Spoke with Patient's daughter and she states that nursing for medication help would be great

## 2019-10-02 NOTE — Telephone Encounter (Signed)
Call pharmacy and daughter  Pt should be off glipizide and on SSI short acting insulin for elevated sugars with meals

## 2019-10-03 ENCOUNTER — Other Ambulatory Visit: Payer: Self-pay | Admitting: Internal Medicine

## 2019-10-03 DIAGNOSIS — D509 Iron deficiency anemia, unspecified: Secondary | ICD-10-CM

## 2019-10-03 NOTE — Telephone Encounter (Signed)
Faxed to [facility] for prescription for Glipizide 5 mg tablet

## 2019-10-03 NOTE — Telephone Encounter (Signed)
[facility]

## 2019-10-03 NOTE — Telephone Encounter (Signed)
Faxed request for a refill or new prescription to [facility] for Ferrex 150 capsule faxed on 10-03-19

## 2019-10-08 ENCOUNTER — Other Ambulatory Visit: Payer: PPO | Admitting: Adult Health Nurse Practitioner

## 2019-10-08 ENCOUNTER — Other Ambulatory Visit: Payer: Self-pay

## 2019-10-08 DIAGNOSIS — Z515 Encounter for palliative care: Secondary | ICD-10-CM | POA: Diagnosis not present

## 2019-10-08 DIAGNOSIS — I509 Heart failure, unspecified: Secondary | ICD-10-CM

## 2019-10-08 NOTE — Progress Notes (Signed)
[facility] Consult Note Telephone: [phone]  Fax: [phone]  PATIENT NAME: [name] DOB: 22-Feb-1925 MRN: [id number]  PRIMARY CARE PROVIDER:   McLean-Scocuzza, Nino Glow, MD  REFERRING PROVIDER:  McLean-Scocuzza, Nino Glow, MD [place],  [place] [address]  RESPONSIBLE PARTY:   Percell Locus, daughter [phone]    RECOMMENDATIONS and PLAN:  1.  Advanced care planning. Patient is full code.  No changes made today  2.  Functional status.  Patient has been working with physical therapy which has now ended.  Daughter does state that she is able to get up and walk longer distances such as going back and forth to the bathroom.  She does require she does require standby assist.  Still is unable to walk very long distances especially outside of the home due to weakness and shortness of breath.  Does require assistance with ADLs.  Daughter did state that physical therapy left and exercises to do.  Encouraged to continue doing these exercises despite PT no longer coming into the home.  3.  Nutritional status.  Report that patient's appetite is good.  Patient's weight has been stable around 187.  No reports of falls, infections, hospitalizations since last visit.  Palliative will continue to monitor for symptom manage/decline and make recommendations as needed.  Patient is stable and daughter good with check-in's every couple of months.  Encouraged to call with any questions or concerns.  I spent 40 minutes providing this consultation,  from 9:00 to 9:40 including time spent with patient/family, chart review, provider coordination, documentaion. More than 50% of the time in this consultation was spent coordinating communication.   HISTORY OF PRESENT ILLNESS:  [name] is a 86 y.o. 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 Palliative Care was asked to help address goals of care.  Patient has had ulcerations to her legs related to edema and PVD.  Daughter states that she was being seen at wound clinic for this.  Wounds have since resolved.  States that her edema is about the same.  She continues to sleep in her recliner.  Denies orthopnea, fever, chest pain, palpitations, headaches, dizziness, N/V/D, constipation, dysuria, hematuria.  CODE STATUS: full code  PPS: 40% HOSPICE ELIGIBILITY/DIAGNOSIS: TBD  PHYSICAL EXAM:  BP 136/80  HR 80  O2 94% on RA General: NAD, frail appearing Cardiovascular: regular rate and rhythm Pulmonary:Lung sounds clear; normal respiratory effort Abdomen: soft, nontender, + bowel sounds GU: no suprapubic tenderness Extremities:has compression hose in place with trace edema noted to bilateral feet, no joint deformities Skin: no rasheson exposed skin Neurological: Weakness; forgetfulness  PAST MEDICAL HISTORY:  Past Medical History:  Diagnosis Date   Anemia    Carotid arterial disease ([facility])    a. 08/2014 U/S: Bilateral < 50% stenosis. Patent vertebrals w/ antegrade flow.    Cervical spine arthritis    Chronic diastolic CHF (congestive heart failure) ([facility])    a. echo 2014: EF 55-60%, DD;  b. 08/2014 Echo: EF 55-60%, no RWMA, GR1DD; c. 02/2016 Echo: EF 65-70%, Gr1 DD.   Cognitive impairment    Depression    Diabetes mellitus without complication ([facility])    Essential hypertension    GERD (gastroesophageal reflux disease)    Glaucoma    Gout    History of renal impairment  Hyperlipidemia    Hypotension    a. Related to Norvasc - 11/2014 ED visit.   Multinodular goiter    a. Noted incidentally on CT 07/2012 and carotid U/S 08/2014;  b. Nl TSH 11/2014.   Paroxysmal SVT (supraventricular tachycardia) (HCC)    a. on Toprol    Prolapsed uterus    Severe aortic stenosis    a. 11/15/16: s/p TAVR Edwards  Sapien 3 THV (size 23 mm, model # 9600TFX, serial # [id number])   Stroke ([facility])    a. CT head 07/2014 showed small thalamic infarct   Vitamin deficiency     SOCIAL HX:  Social History   Tobacco Use   Smoking status: Never Smoker   Smokeless tobacco: Never Used  Substance Use Topics   Alcohol use: No    ALLERGIES:  Allergies  Allergen Reactions   Metformin Hcl Other (See Comments)    GI upset   Metformin Other (See Comments), Diarrhea and Nausea And Vomiting    Other reaction(s): Other (See Comments), Unknown   Metformin And Related Diarrhea     PERTINENT MEDICATIONS:  Outpatient Encounter Medications as of 10/08/2019  Medication Sig   ferrous sulfate 220 (44 Fe) MG/5ML solution Take 5 mLs (220 mg total) by mouth daily with breakfast.   ACCU-CHEK AVIVA PLUS test strip USE 1 STRIP TO TEST BLOOD SUGARS 3 TIMES A DAY   acetaminophen (TYLENOL) 500 MG tablet Take 1 tablet (500 mg total) by mouth every 6 (six) hours as needed for mild pain or headache.   allopurinol (ZYLOPRIM) 100 MG tablet Take 1 tablet (100 mg total) by mouth daily.   aspirin 81 MG chewable tablet Chew 1 tablet (81 mg total) by mouth daily.   blood glucose meter kit and supplies Dispense based on patient and insurance preference. Use 1x daily as directed. (FOR ICD-10 E10.9, E11.9).   brimonidine (ALPHAGAN P) 0.1 % SOLN Place 1 drop into the right eye 2 (two) times daily.   Carboxymethylcellulose Sodium (ARTIFICIAL TEARS OP) Apply 1 drop to eye 2 (two) times daily. Left eye   cholecalciferol (VITAMIN D) 25 MCG (1000 UT) tablet Take 1 tablet (1,000 Units total) by mouth daily.   clotrimazole (LOTRIMIN) 1 % cream APPLY TOPICALLY TO THE AFFECTED AREA ONCE A DAY AT BEDTIME FOR 7-14 DAYS   Continuous Blood Gluc Receiver (FREESTYLE LIBRE 14 DAY READER) DEVI 1 Device by Does not apply route every 14 (fourteen) days.   Continuous Blood Gluc Sensor (FREESTYLE LIBRE 14 DAY SENSOR) MISC 1 Device by Does not apply  route every 14 (fourteen) days.   dimethicone (PROSHIELD PLUS SKIN PROTECTANT) 1 % cream 1 application to the buttock 2 times daily to help protect skin   Dorzolamide HCl-Timolol Mal (COSOPT OP) Place 1 drop into the right eye 2 (two) times daily.    fluticasone (FLONASE) 50 MCG/ACT nasal spray Place 1 spray into both nostrils daily. Prn max 2 sprays total each nose   gabapentin (NEURONTIN) 100 MG capsule TAKE 1 CAPSULE BY MOUTH EVERY DAY 1 HOUR BEFORE BEDTIME   gabapentin (NEURONTIN) 300 MG capsule Take 1 capsule (300 mg total) by mouth 2 (two) times daily.   glucose blood test strip Accu-Chek Aviva Plus test strips   glucose blood test strip qid Use as instructed E11.9   insulin aspart (NOVOLOG FLEXPEN) 100 UNIT/ML FlexPen TID before meals 15-30 minutes based on sugar reading before meals If sugar: 70-130 0 units, 131-180 2 units, 181-240 4 units, 241-300 6 units, 301-350  8 units, 351-400 10 units, >400 12 units and call doctor   insulin degludec (TRESIBA FLEXTOUCH) 100 UNIT/ML FlexTouch Pen Inject 0.1 mLs (10 Units total) into the skin daily.   Insulin Pen Needle (PEN NEEDLES) 30G X 8 MM MISC 1 Device by Does not apply route once a week.   Insulin Pen Needle (PEN NEEDLES) 30G X 8 MM MISC 1 Device by Does not apply route 3 (three) times daily before meals.   Insulin Pen Needle (PEN NEEDLES) 31G X 8 MM MISC 1 pen by Does not apply route daily.   Lancet Device MISC 1 Device by Does not apply route as directed.   Lancets (ONETOUCH ULTRASOFT) lancets Bid E 11.9 Use as instructed   latanoprost (XALATAN) 0.005 % ophthalmic solution Place 1 drop into the right eye at bedtime.    LORazepam (ATIVAN) 1 MG tablet Take 1 tablet (1 mg total) by mouth at bedtime as needed for anxiety.   lovastatin (MEVACOR) 20 MG tablet Take 1 tablet (20 mg total) by mouth at bedtime. Note reduced dose due to age and chronic medical issues d/c 40 mg dose   metoprolol succinate (TOPROL-XL) 25 MG 24 hr tablet  Take 1 tablet (25 mg total) by mouth daily.   metroNIDAZOLE (METROGEL) 0.75 % vaginal gel Place 1 Applicatorful vaginally 2 (two) times daily. Prn BV   Multiple Vitamin (MULTI-VITAMIN PO) Take 1 tablet by mouth daily.   mupirocin ointment (BACTROBAN) 2 % Apply 1 application topically 2 (two) times daily. To open sores as needed   mupirocin ointment (BACTROBAN) 2 % Apply topically 2 (two) times daily.   ONETOUCH DELICA LANCETS 25D MISC USE TO TEST BLOOD SUGAR ONCE A DAY   pantoprazole (PROTONIX) 40 MG tablet Take 1 tablet (40 mg total) by mouth daily. 30 minutes before breakfast OR dinner   pilocarpine (PILOCAR) 4 % ophthalmic solution Place 1 drop into the right eye 4 (four) times daily.    polyethylene glycol (MIRALAX) packet Take 17 g by mouth daily. prn   Potassium Chloride ER 20 MEQ TBCR Take 20 mEq by mouth as directed. Take once daily and extra pill with any extra torsemide (Patient taking differently: Take 20 mEq by mouth as directed. Take once daily and extra pill with any extra torsemide)   Semaglutide,0.25 or 0.5MG/DOS, (OZEMPIC, 0.25 OR 0.5 MG/DOSE,) 2 MG/1.5ML SOPN Inject 0.25 mg into the skin once a week.   sertraline (ZOLOFT) 50 MG tablet Take 1 tablet (50 mg total) by mouth daily.   sitaGLIPtin (JANUVIA) 25 MG tablet Take 1 tablet (25 mg total) by mouth daily. D/c 50 mg d/c glipizide 2.5 mg daily   spironolactone (ALDACTONE) 25 MG tablet Take 25 mg by mouth daily.   torsemide (DEMADEX) 20 MG tablet Take 2 tablets (40 mg total) by mouth as directed. Take 40MG by mouth every day with extra 40MG after lunch for weight greater than 180 lbs (Patient taking differently: Take 40 mg by mouth 2 (two) times daily. Take 40MG by mouth every day with extra 40MG after lunch for weight greater than 180 lbs)   traZODone (DESYREL) 100 MG tablet Take 0.5-1 tablets (50-100 mg total) by mouth at bedtime as needed.   triamcinolone cream (KENALOG) 0.1 % Apply 1 application topically 2 (two)  times daily. Prn legs   vitamin C (ASCORBIC ACID) 500 MG tablet Take 1 tablet (500 mg total) by mouth daily.   Zinc Oxide 10 % OINT Apply 1 application topically daily as needed (rash).  Applied to patient's bottom   No facility-administered encounter medications on file as of 10/08/2019.      Lea Baine Jenetta Downer, NP

## 2019-10-09 DIAGNOSIS — M47812 Spondylosis without myelopathy or radiculopathy, cervical region: Secondary | ICD-10-CM | POA: Diagnosis not present

## 2019-10-09 DIAGNOSIS — M2041 Other hammer toe(s) (acquired), right foot: Secondary | ICD-10-CM | POA: Diagnosis not present

## 2019-10-09 DIAGNOSIS — I152 Hypertension secondary to endocrine disorders: Secondary | ICD-10-CM | POA: Diagnosis not present

## 2019-10-09 DIAGNOSIS — N184 Chronic kidney disease, stage 4 (severe): Secondary | ICD-10-CM | POA: Diagnosis not present

## 2019-10-09 DIAGNOSIS — E785 Hyperlipidemia, unspecified: Secondary | ICD-10-CM | POA: Diagnosis not present

## 2019-10-09 DIAGNOSIS — E1122 Type 2 diabetes mellitus with diabetic chronic kidney disease: Secondary | ICD-10-CM | POA: Diagnosis not present

## 2019-10-09 DIAGNOSIS — I872 Venous insufficiency (chronic) (peripheral): Secondary | ICD-10-CM | POA: Diagnosis not present

## 2019-10-09 DIAGNOSIS — I130 Hypertensive heart and chronic kidney disease with heart failure and stage 1 through stage 4 chronic kidney disease, or unspecified chronic kidney disease: Secondary | ICD-10-CM | POA: Diagnosis not present

## 2019-10-09 DIAGNOSIS — N1330 Unspecified hydronephrosis: Secondary | ICD-10-CM | POA: Diagnosis not present

## 2019-10-09 DIAGNOSIS — M810 Age-related osteoporosis without current pathological fracture: Secondary | ICD-10-CM | POA: Diagnosis not present

## 2019-10-09 DIAGNOSIS — G500 Trigeminal neuralgia: Secondary | ICD-10-CM | POA: Diagnosis not present

## 2019-10-09 DIAGNOSIS — E042 Nontoxic multinodular goiter: Secondary | ICD-10-CM | POA: Diagnosis not present

## 2019-10-09 DIAGNOSIS — M1712 Unilateral primary osteoarthritis, left knee: Secondary | ICD-10-CM | POA: Diagnosis not present

## 2019-10-09 DIAGNOSIS — M47817 Spondylosis without myelopathy or radiculopathy, lumbosacral region: Secondary | ICD-10-CM | POA: Diagnosis not present

## 2019-10-09 DIAGNOSIS — I700 Atherosclerosis of aorta: Secondary | ICD-10-CM | POA: Diagnosis not present

## 2019-10-09 DIAGNOSIS — E669 Obesity, unspecified: Secondary | ICD-10-CM | POA: Diagnosis not present

## 2019-10-09 DIAGNOSIS — E1151 Type 2 diabetes mellitus with diabetic peripheral angiopathy without gangrene: Secondary | ICD-10-CM | POA: Diagnosis not present

## 2019-10-09 DIAGNOSIS — M48061 Spinal stenosis, lumbar region without neurogenic claudication: Secondary | ICD-10-CM | POA: Diagnosis not present

## 2019-10-09 DIAGNOSIS — R918 Other nonspecific abnormal finding of lung field: Secondary | ICD-10-CM | POA: Diagnosis not present

## 2019-10-09 DIAGNOSIS — I5032 Chronic diastolic (congestive) heart failure: Secondary | ICD-10-CM | POA: Diagnosis not present

## 2019-10-09 DIAGNOSIS — E1142 Type 2 diabetes mellitus with diabetic polyneuropathy: Secondary | ICD-10-CM | POA: Diagnosis not present

## 2019-10-09 DIAGNOSIS — G3184 Mild cognitive impairment, so stated: Secondary | ICD-10-CM | POA: Diagnosis not present

## 2019-10-09 DIAGNOSIS — K219 Gastro-esophageal reflux disease without esophagitis: Secondary | ICD-10-CM | POA: Diagnosis not present

## 2019-10-09 DIAGNOSIS — M2042 Other hammer toe(s) (acquired), left foot: Secondary | ICD-10-CM | POA: Diagnosis not present

## 2019-10-09 DIAGNOSIS — I6523 Occlusion and stenosis of bilateral carotid arteries: Secondary | ICD-10-CM | POA: Diagnosis not present

## 2019-10-09 NOTE — Telephone Encounter (Signed)
Ok please call [facility] and give verbal for nursing for medication help and have them fax order for me to sign off for this  Thanks Talmo

## 2019-10-11 NOTE — Telephone Encounter (Signed)
Called and informed [name] of verbal. She was given my name, Dr Olivia Mackie McLean-Scocuzza name, the orders, patient's name and DOB.

## 2019-10-14 DIAGNOSIS — I129 Hypertensive chronic kidney disease with stage 1 through stage 4 chronic kidney disease, or unspecified chronic kidney disease: Secondary | ICD-10-CM | POA: Diagnosis not present

## 2019-10-14 DIAGNOSIS — I5022 Chronic systolic (congestive) heart failure: Secondary | ICD-10-CM | POA: Diagnosis not present

## 2019-10-14 DIAGNOSIS — E876 Hypokalemia: Secondary | ICD-10-CM | POA: Diagnosis not present

## 2019-10-14 DIAGNOSIS — E1122 Type 2 diabetes mellitus with diabetic chronic kidney disease: Secondary | ICD-10-CM | POA: Diagnosis not present

## 2019-10-14 DIAGNOSIS — N1832 Chronic kidney disease, stage 3b: Secondary | ICD-10-CM | POA: Diagnosis not present

## 2019-10-20 ENCOUNTER — Other Ambulatory Visit: Payer: Self-pay | Admitting: Family

## 2019-10-20 DIAGNOSIS — E1142 Type 2 diabetes mellitus with diabetic polyneuropathy: Secondary | ICD-10-CM

## 2019-10-23 DIAGNOSIS — I129 Hypertensive chronic kidney disease with stage 1 through stage 4 chronic kidney disease, or unspecified chronic kidney disease: Secondary | ICD-10-CM | POA: Diagnosis not present

## 2019-10-23 DIAGNOSIS — I5022 Chronic systolic (congestive) heart failure: Secondary | ICD-10-CM | POA: Diagnosis not present

## 2019-10-23 DIAGNOSIS — N1832 Chronic kidney disease, stage 3b: Secondary | ICD-10-CM | POA: Diagnosis not present

## 2019-10-23 DIAGNOSIS — E1122 Type 2 diabetes mellitus with diabetic chronic kidney disease: Secondary | ICD-10-CM | POA: Diagnosis not present

## 2019-10-23 DIAGNOSIS — E876 Hypokalemia: Secondary | ICD-10-CM | POA: Diagnosis not present

## 2019-10-24 DIAGNOSIS — I5022 Chronic systolic (congestive) heart failure: Secondary | ICD-10-CM | POA: Diagnosis not present

## 2019-10-30 ENCOUNTER — Telehealth: Payer: Self-pay | Admitting: Internal Medicine

## 2019-10-30 ENCOUNTER — Telehealth: Payer: Self-pay

## 2019-10-30 NOTE — Telephone Encounter (Signed)
Received a message that daughter, [name],called to report that patient had a fall last night and wanted to schedule visit with NP. [name] noted that patient is reporting some soreness to her left ankle and wrist. No noted bruising or swelling. Functional status is at patient's baseline. Scheduled visit with Hanley Ben at next available appointment for Friday 11/01/19 @ 10 am.

## 2019-11-01 ENCOUNTER — Other Ambulatory Visit: Payer: PPO | Admitting: Adult Health Nurse Practitioner

## 2019-11-01 ENCOUNTER — Other Ambulatory Visit: Payer: Self-pay

## 2019-11-01 DIAGNOSIS — I509 Heart failure, unspecified: Secondary | ICD-10-CM

## 2019-11-01 DIAGNOSIS — Z515 Encounter for palliative care: Secondary | ICD-10-CM

## 2019-11-01 NOTE — Progress Notes (Addendum)
[facility] Consult Note Telephone: [phone]  Fax: [phone]  PATIENT NAME: [name] DOB: May 27, 1924 MRN: [id number]  PRIMARY CARE PROVIDER:   McLean-Scocuzza, Nino Glow, MD  REFERRING PROVIDER:  McLean-Scocuzza, Nino Glow, MD [place],  [place] [address]  RESPONSIBLE PARTY:   Percell Locus, daughter [phone]    RECOMMENDATIONS and PLAN:  1.  Advanced care planning. Patient is full code.  No changes made today  2.  Pain.  Patient had fall 2 days ago.  Still having pain in "hind end."  Did notice slight swelling in coccyx area.  No erythema, ecchymosis, or open skin noted. Has tenderness to touch in coccyx area.  Palpated lower back and around spine without any tenderness noted.  Has been using heating pad and Tylenol with very little relief.  Patient is still able to get up and ambulate with assistance.  Rates the pain 8/10.  Discussed with daughter and caregiver that if xrays showed fracture in the coccyx area more than likely would just use conservative measures to control the pain.  But if wanted to could pursue getting xrays.  Did order Tramadol 50 mg Q8 hours as needed for pain. Checked patient on [place] substance abuse website. Instructed to start at half a tab (25 mg) and if not effective could take a whole tab.  Instructed on side effects of drowsiness and possible drug induced delirium and to stop if noted.  Will reach out to PCP to request hospital bed with bed rails and gel overlay for safety as she is continuing to attempt to get up unassisted.  3.  Hallucinations. Patient has been having hallucinations in which she is seeing 3 babies. These hallucinations are causing her anxiety as she is trying to protect the babies.  She was trying to get up unassisted while trying to get to the babies when she fell.  The hallucinations are keeping her awake despite taking melatonin and trazodone.  Have ordered  seroquel 25 mg QHS.   Instructed to start with half a tab (12.5 mg) and if not effective could increase to whole tab.  Instructed on side effects of drowsiness and possible increased agitation and to stop if having increased agitation.    Palliative will continue to monitor for symptom management/decline and make recommendations as needed. Have appointment in 2 weeks.  Daughter encouraged to call with any concerns. Will call next week to evaluate effectiveness of interventions.  I spent 60 minutes providing this consultation,  from 10:20 to 11:20 including time spent with patient/family, chart review, provider coordination, documentaion. More than 50% of the time in this consultation was spent coordinating communication.   HISTORY OF PRESENT ILLNESS:  [name] is a 86 y.o. 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 Palliative Care was asked to help address goals of care. Patient is having increased hallucinations and is attempting more often to get up unassisted.  This has led to a fall, see above.  Patient is eating and drinking less.  Will usually eat a good breakfast but only bites the rest of the day. Edema stable. Denies fever, dysuria, hematuria, N/V/D, constipation, headaches, dizziness, increased SOB or cough.  No obvious signs or symptoms of infection.  Patient has diagnosis of cognitive impairment but believe there may be underlying dementia that is progressing.  Daughter  interested in hospice services when appropriate.   CODE STATUS: full code  PPS: 40% HOSPICE ELIGIBILITY/DIAGNOSIS: TBD  PHYSICAL EXAM:  BP 136/72  HR 78  O2 95% on RA  Weight 177.8 General: NAD, frail appearing Cardiovascular: regular rate and rhythm Pulmonary:Lung sounds clear; normal respiratory effort Abdomen: soft, nontender, + bowel sounds GU: no suprapubic  tenderness Extremities:trace to 1+ edema to bilateral lower extremities Skin: no rasheson exposed skin Neurological: Weakness; forgetfulness  PAST MEDICAL HISTORY:  Past Medical History:  Diagnosis Date  . Anemia   . Carotid arterial disease ([facility])    a. 08/2014 U/S: Bilateral < 50% stenosis. Patent vertebrals w/ antegrade flow.   . Cervical spine arthritis   . Chronic diastolic CHF (congestive heart failure) ([facility])    a. echo 2014: EF 55-60%, DD;  b. 08/2014 Echo: EF 55-60%, no RWMA, GR1DD; c. 02/2016 Echo: EF 65-70%, Gr1 DD.  Marland Kitchen Cognitive impairment   . Depression   . Diabetes mellitus without complication ([facility])   . Essential hypertension   . GERD (gastroesophageal reflux disease)   . Glaucoma   . Gout   . History of renal impairment   . Hyperlipidemia   . Hypotension    a. Related to Norvasc - 11/2014 ED visit.  . Multinodular goiter    a. Noted incidentally on CT 07/2012 and carotid U/S 08/2014;  b. Nl TSH 11/2014.  Marland Kitchen Paroxysmal SVT (supraventricular tachycardia) ([facility])    a. on Toprol   . Prolapsed uterus   . Severe aortic stenosis    a. 11/15/16: s/p TAVR Edwards Sapien 3 THV (size 23 mm, model # 9600TFX, serial # [id number])  . Stroke [facility])    a. CT head 07/2014 showed small thalamic infarct  . Vitamin deficiency     SOCIAL HX:  Social History   Tobacco Use  . Smoking status: Never Smoker  . Smokeless tobacco: Never Used  Substance Use Topics  . Alcohol use: No    ALLERGIES:  Allergies  Allergen Reactions  . Metformin Hcl Other (See Comments)    GI upset  . Metformin Other (See Comments), Diarrhea and Nausea And Vomiting    Other reaction(s): Other (See Comments), Unknown  . Metformin And Related Diarrhea     PERTINENT MEDICATIONS:  Outpatient Encounter Medications as of 11/01/2019  Medication Sig  . ferrous sulfate 220 (44 Fe) MG/5ML solution Take 5 mLs (220 mg total) by mouth daily with breakfast.  . ACCU-CHEK AVIVA PLUS test strip USE 1 STRIP TO TEST BLOOD  SUGARS 3 TIMES A DAY  . acetaminophen (TYLENOL) 500 MG tablet Take 1 tablet (500 mg total) by mouth every 6 (six) hours as needed for mild pain or headache.  . allopurinol (ZYLOPRIM) 100 MG tablet Take 1 tablet (100 mg total) by mouth daily.  Marland Kitchen aspirin 81 MG chewable tablet Chew 1 tablet (81 mg total) by mouth daily.  . blood glucose meter kit and supplies Dispense based on patient and insurance preference. Use 1x daily as directed. (FOR ICD-10 E10.9, E11.9).  . brimonidine (ALPHAGAN P) 0.1 % SOLN Place 1 drop into the right eye 2 (two) times daily.  . Carboxymethylcellulose Sodium (ARTIFICIAL TEARS OP) Apply 1 drop to eye 2 (two) times daily. Left eye  . cholecalciferol (VITAMIN D) 25 MCG (1000 UT) tablet Take 1 tablet (1,000 Units total) by mouth daily.  . clotrimazole (LOTRIMIN) 1 % cream APPLY TOPICALLY TO THE AFFECTED AREA ONCE A DAY AT BEDTIME FOR 7-14 DAYS  . Continuous Blood  Gluc Receiver (FREESTYLE LIBRE 14 DAY READER) DEVI 1 Device by Does not apply route every 14 (fourteen) days.  . Continuous Blood Gluc Sensor (FREESTYLE LIBRE 14 DAY SENSOR) MISC 1 Device by Does not apply route every 14 (fourteen) days.  . dimethicone (PROSHIELD PLUS SKIN PROTECTANT) 1 % cream 1 application to the buttock 2 times daily to help protect skin  . Dorzolamide HCl-Timolol Mal (COSOPT OP) Place 1 drop into the right eye 2 (two) times daily.   . fluticasone (FLONASE) 50 MCG/ACT nasal spray Place 1 spray into both nostrils daily. Prn max 2 sprays total each nose  . gabapentin (NEURONTIN) 100 MG capsule TAKE 1 CAPSULE BY MOUTH EVERY DAY 1 HOUR BEFORE BEDTIME  . gabapentin (NEURONTIN) 300 MG capsule Take 1 capsule (300 mg total) by mouth 2 (two) times daily.  Marland Kitchen glucose blood test strip Accu-Chek Aviva Plus test strips  . glucose blood test strip qid Use as instructed E11.9  . insulin aspart (NOVOLOG FLEXPEN) 100 UNIT/ML FlexPen TID before meals 15-30 minutes based on sugar reading before meals If sugar: 70-130 0  units, 131-180 2 units, 181-240 4 units, 241-300 6 units, 301-350 8 units, 351-400 10 units, >400 12 units and call doctor  . insulin degludec (TRESIBA FLEXTOUCH) 100 UNIT/ML FlexTouch Pen Inject 0.1 mLs (10 Units total) into the skin daily.  . Insulin Pen Needle (PEN NEEDLES) 30G X 8 MM MISC 1 Device by Does not apply route once a week.  . Insulin Pen Needle (PEN NEEDLES) 30G X 8 MM MISC 1 Device by Does not apply route 3 (three) times daily before meals.  . Insulin Pen Needle (PEN NEEDLES) 31G X 8 MM MISC 1 pen by Does not apply route daily.  Elmore Guise Device MISC 1 Device by Does not apply route as directed.  . Lancets (ONETOUCH ULTRASOFT) lancets Bid E 11.9 Use as instructed  . latanoprost (XALATAN) 0.005 % ophthalmic solution Place 1 drop into the right eye at bedtime.   Marland Kitchen LORazepam (ATIVAN) 1 MG tablet Take 1 tablet (1 mg total) by mouth at bedtime as needed for anxiety.  . lovastatin (MEVACOR) 20 MG tablet Take 1 tablet (20 mg total) by mouth at bedtime. Note reduced dose due to age and chronic medical issues d/c 40 mg dose  . metoprolol succinate (TOPROL-XL) 25 MG 24 hr tablet Take 1 tablet (25 mg total) by mouth daily.  . metroNIDAZOLE (METROGEL) 0.75 % vaginal gel Place 1 Applicatorful vaginally 2 (two) times daily. Prn BV  . Multiple Vitamin (MULTI-VITAMIN PO) Take 1 tablet by mouth daily.  . mupirocin ointment (BACTROBAN) 2 % Apply 1 application topically 2 (two) times daily. To open sores as needed  . mupirocin ointment (BACTROBAN) 2 % Apply topically 2 (two) times daily.  Glory Rosebush DELICA LANCETS 71I MISC USE TO TEST BLOOD SUGAR ONCE A DAY  . pantoprazole (PROTONIX) 40 MG tablet Take 1 tablet (40 mg total) by mouth daily. 30 minutes before breakfast OR dinner  . pilocarpine (PILOCAR) 4 % ophthalmic solution Place 1 drop into the right eye 4 (four) times daily.   . polyethylene glycol (MIRALAX) packet Take 17 g by mouth daily. prn  . Potassium Chloride ER 20 MEQ TBCR Take 20 mEq by  mouth as directed. Take once daily and extra pill with any extra torsemide (Patient taking differently: Take 20 mEq by mouth as directed. Take once daily and extra pill with any extra torsemide)  . Semaglutide,0.25 or 0.5MG/DOS, (OZEMPIC, 0.25 OR  0.5 MG/DOSE,) 2 MG/1.5ML SOPN Inject 0.25 mg into the skin once a week.  . sertraline (ZOLOFT) 50 MG tablet Take 1 tablet (50 mg total) by mouth daily.  . sitaGLIPtin (JANUVIA) 25 MG tablet Take 1 tablet (25 mg total) by mouth daily. D/c 50 mg d/c glipizide 2.5 mg daily  . spironolactone (ALDACTONE) 25 MG tablet Take 25 mg by mouth daily.  Marland Kitchen torsemide (DEMADEX) 20 MG tablet Take 2 tablets (40 mg total) by mouth as directed. Take 40MG by mouth every day with extra 40MG after lunch for weight greater than 180 lbs (Patient taking differently: Take 40 mg by mouth 2 (two) times daily. Take 40MG by mouth every day with extra 40MG after lunch for weight greater than 180 lbs)  . traZODone (DESYREL) 100 MG tablet Take 0.5-1 tablets (50-100 mg total) by mouth at bedtime as needed.  . triamcinolone cream (KENALOG) 0.1 % Apply 1 application topically 2 (two) times daily. Prn legs  . vitamin C (ASCORBIC ACID) 500 MG tablet Take 1 tablet (500 mg total) by mouth daily.  . Zinc Oxide 10 % OINT Apply 1 application topically daily as needed (rash). Applied to patient's bottom   No facility-administered encounter medications on file as of 11/01/2019.      Kyira Volkert Jenetta Downer, NP

## 2019-11-04 ENCOUNTER — Telehealth: Payer: Self-pay | Admitting: Internal Medicine

## 2019-11-04 NOTE — Telephone Encounter (Signed)
Fax Rx DME order hospital bed with rails and gel mattress to [facility]  [phone]  I50.321, M47.817, N18.4, R53.81, E11.59, R29.898, M48.061   Thanks MTS

## 2019-11-05 ENCOUNTER — Emergency Department: Payer: PPO

## 2019-11-05 ENCOUNTER — Inpatient Hospital Stay
Admission: EM | Admit: 2019-11-05 | Discharge: 2019-11-07 | DRG: 291 | Disposition: A | Discharge status: Home Health | Payer: PPO | Attending: Internal Medicine | Admitting: Internal Medicine

## 2019-11-05 ENCOUNTER — Inpatient Hospital Stay: Payer: PPO

## 2019-11-05 ENCOUNTER — Telehealth: Payer: Self-pay

## 2019-11-05 ENCOUNTER — Other Ambulatory Visit: Payer: PPO | Admitting: Adult Health Nurse Practitioner

## 2019-11-05 ENCOUNTER — Other Ambulatory Visit: Payer: Self-pay

## 2019-11-05 DIAGNOSIS — F419 Anxiety disorder, unspecified: Secondary | ICD-10-CM | POA: Diagnosis present

## 2019-11-05 DIAGNOSIS — Z8673 Personal history of transient ischemic attack (TIA), and cerebral infarction without residual deficits: Secondary | ICD-10-CM

## 2019-11-05 DIAGNOSIS — J9691 Respiratory failure, unspecified with hypoxia: Secondary | ICD-10-CM | POA: Diagnosis present

## 2019-11-05 DIAGNOSIS — I5033 Acute on chronic diastolic (congestive) heart failure: Secondary | ICD-10-CM | POA: Diagnosis not present

## 2019-11-05 DIAGNOSIS — Z794 Long term (current) use of insulin: Secondary | ICD-10-CM

## 2019-11-05 DIAGNOSIS — I10 Essential (primary) hypertension: Secondary | ICD-10-CM | POA: Diagnosis present

## 2019-11-05 DIAGNOSIS — Z515 Encounter for palliative care: Secondary | ICD-10-CM

## 2019-11-05 DIAGNOSIS — Z83511 Family history of glaucoma: Secondary | ICD-10-CM | POA: Diagnosis not present

## 2019-11-05 DIAGNOSIS — Z9851 Tubal ligation status: Secondary | ICD-10-CM

## 2019-11-05 DIAGNOSIS — R4182 Altered mental status, unspecified: Secondary | ICD-10-CM | POA: Diagnosis not present

## 2019-11-05 DIAGNOSIS — I2510 Atherosclerotic heart disease of native coronary artery without angina pectoris: Secondary | ICD-10-CM | POA: Diagnosis not present

## 2019-11-05 DIAGNOSIS — Z952 Presence of prosthetic heart valve: Secondary | ICD-10-CM

## 2019-11-05 DIAGNOSIS — Z20822 Contact with and (suspected) exposure to covid-19: Secondary | ICD-10-CM | POA: Diagnosis not present

## 2019-11-05 DIAGNOSIS — Z833 Family history of diabetes mellitus: Secondary | ICD-10-CM

## 2019-11-05 DIAGNOSIS — S069X9A Unspecified intracranial injury with loss of consciousness of unspecified duration, initial encounter: Secondary | ICD-10-CM | POA: Diagnosis not present

## 2019-11-05 DIAGNOSIS — N184 Chronic kidney disease, stage 4 (severe): Secondary | ICD-10-CM | POA: Diagnosis present

## 2019-11-05 DIAGNOSIS — I509 Heart failure, unspecified: Secondary | ICD-10-CM

## 2019-11-05 DIAGNOSIS — E1122 Type 2 diabetes mellitus with diabetic chronic kidney disease: Secondary | ICD-10-CM | POA: Diagnosis not present

## 2019-11-05 DIAGNOSIS — I6381 Other cerebral infarction due to occlusion or stenosis of small artery: Secondary | ICD-10-CM | POA: Diagnosis not present

## 2019-11-05 DIAGNOSIS — I959 Hypotension, unspecified: Secondary | ICD-10-CM | POA: Diagnosis not present

## 2019-11-05 DIAGNOSIS — F329 Major depressive disorder, single episode, unspecified: Secondary | ICD-10-CM | POA: Diagnosis present

## 2019-11-05 DIAGNOSIS — Z79899 Other long term (current) drug therapy: Secondary | ICD-10-CM | POA: Diagnosis not present

## 2019-11-05 DIAGNOSIS — R64 Cachexia: Secondary | ICD-10-CM | POA: Diagnosis not present

## 2019-11-05 DIAGNOSIS — M109 Gout, unspecified: Secondary | ICD-10-CM | POA: Diagnosis not present

## 2019-11-05 DIAGNOSIS — E119 Type 2 diabetes mellitus without complications: Secondary | ICD-10-CM

## 2019-11-05 DIAGNOSIS — R627 Adult failure to thrive: Secondary | ICD-10-CM | POA: Diagnosis not present

## 2019-11-05 DIAGNOSIS — J189 Pneumonia, unspecified organism: Secondary | ICD-10-CM

## 2019-11-05 DIAGNOSIS — R531 Weakness: Secondary | ICD-10-CM

## 2019-11-05 DIAGNOSIS — W19XXXA Unspecified fall, initial encounter: Secondary | ICD-10-CM | POA: Diagnosis present

## 2019-11-05 DIAGNOSIS — I130 Hypertensive heart and chronic kidney disease with heart failure and stage 1 through stage 4 chronic kidney disease, or unspecified chronic kidney disease: Secondary | ICD-10-CM | POA: Diagnosis not present

## 2019-11-05 DIAGNOSIS — I517 Cardiomegaly: Secondary | ICD-10-CM | POA: Diagnosis not present

## 2019-11-05 DIAGNOSIS — R6251 Failure to thrive (child): Secondary | ICD-10-CM | POA: Diagnosis not present

## 2019-11-05 DIAGNOSIS — Z7982 Long term (current) use of aspirin: Secondary | ICD-10-CM

## 2019-11-05 DIAGNOSIS — Y92009 Unspecified place in unspecified non-institutional (private) residence as the place of occurrence of the external cause: Secondary | ICD-10-CM | POA: Diagnosis not present

## 2019-11-05 DIAGNOSIS — E785 Hyperlipidemia, unspecified: Secondary | ICD-10-CM | POA: Diagnosis not present

## 2019-11-05 DIAGNOSIS — R9431 Abnormal electrocardiogram [ECG] [EKG]: Secondary | ICD-10-CM

## 2019-11-05 DIAGNOSIS — K219 Gastro-esophageal reflux disease without esophagitis: Secondary | ICD-10-CM | POA: Diagnosis not present

## 2019-11-05 DIAGNOSIS — M6281 Muscle weakness (generalized): Secondary | ICD-10-CM | POA: Diagnosis not present

## 2019-11-05 DIAGNOSIS — Z888 Allergy status to other drugs, medicaments and biological substances status: Secondary | ICD-10-CM

## 2019-11-05 DIAGNOSIS — Z6830 Body mass index (BMI) 30.0-30.9, adult: Secondary | ICD-10-CM

## 2019-11-05 DIAGNOSIS — R279 Unspecified lack of coordination: Secondary | ICD-10-CM | POA: Diagnosis not present

## 2019-11-05 DIAGNOSIS — H409 Unspecified glaucoma: Secondary | ICD-10-CM | POA: Diagnosis present

## 2019-11-05 DIAGNOSIS — D72829 Elevated white blood cell count, unspecified: Secondary | ICD-10-CM

## 2019-11-05 DIAGNOSIS — R0600 Dyspnea, unspecified: Secondary | ICD-10-CM

## 2019-11-05 DIAGNOSIS — I281 Aneurysm of pulmonary artery: Secondary | ICD-10-CM | POA: Diagnosis not present

## 2019-11-05 DIAGNOSIS — R0902 Hypoxemia: Secondary | ICD-10-CM | POA: Diagnosis not present

## 2019-11-05 DIAGNOSIS — I712 Thoracic aortic aneurysm, without rupture: Secondary | ICD-10-CM | POA: Diagnosis not present

## 2019-11-05 DIAGNOSIS — R5381 Other malaise: Secondary | ICD-10-CM | POA: Diagnosis not present

## 2019-11-05 HISTORY — DX: Atherosclerotic heart disease of native coronary artery without angina pectoris: I25.10

## 2019-11-05 HISTORY — DX: Cardiac arrhythmia, unspecified: I49.9

## 2019-11-05 HISTORY — DX: Chronic kidney disease, unspecified: N18.9

## 2019-11-05 HISTORY — DX: Anxiety disorder, unspecified: F41.9

## 2019-11-05 LAB — CBC WITH DIFFERENTIAL/PLATELET
Abs Immature Granulocytes: 0.1 10*3/uL — ABNORMAL HIGH (ref 0.00–0.07)
Basophils Absolute: 0.1 10*3/uL (ref 0.0–0.1)
Basophils Relative: 1 %
Eosinophils Absolute: 0.5 10*3/uL (ref 0.0–0.5)
Eosinophils Relative: 5 %
HCT: 34.8 % — ABNORMAL LOW (ref 36.0–46.0)
Hemoglobin: 11.3 g/dL — ABNORMAL LOW (ref 12.0–15.0)
Immature Granulocytes: 1 %
Lymphocytes Relative: 7 %
Lymphs Abs: 0.8 10*3/uL (ref 0.7–4.0)
MCH: 31.1 pg (ref 26.0–34.0)
MCHC: 32.5 g/dL (ref 30.0–36.0)
MCV: 95.9 fL (ref 80.0–100.0)
Monocytes Absolute: 0.8 10*3/uL (ref 0.1–1.0)
Monocytes Relative: 6 %
Neutro Abs: 9.8 10*3/uL — ABNORMAL HIGH (ref 1.7–7.7)
Neutrophils Relative %: 80 %
Platelets: 211 10*3/uL (ref 150–400)
RBC: 3.63 MIL/uL — ABNORMAL LOW (ref 3.87–5.11)
RDW: 15.3 % (ref 11.5–15.5)
WBC: 12 10*3/uL — ABNORMAL HIGH (ref 4.0–10.5)
nRBC: 0 % (ref 0.0–0.2)

## 2019-11-05 LAB — COMPREHENSIVE METABOLIC PANEL
ALT: 13 U/L (ref 0–44)
AST: 25 U/L (ref 15–41)
Albumin: 3.2 g/dL — ABNORMAL LOW (ref 3.5–5.0)
Alkaline Phosphatase: 50 U/L (ref 38–126)
Anion gap: 10 (ref 5–15)
BUN: 23 mg/dL (ref 8–23)
CO2: 27 mmol/L (ref 22–32)
Calcium: 9 mg/dL (ref 8.9–10.3)
Chloride: 104 mmol/L (ref 98–111)
Creatinine, Ser: 1.36 mg/dL — ABNORMAL HIGH (ref 0.44–1.00)
GFR calc Af Amer: 39 mL/min — ABNORMAL LOW (ref >60)
GFR calc non Af Amer: 33 mL/min — ABNORMAL LOW (ref >60)
Glucose, Bld: 221 mg/dL — ABNORMAL HIGH (ref 70–99)
Potassium: 4.3 mmol/L (ref 3.5–5.1)
Sodium: 141 mmol/L (ref 135–145)
Total Bilirubin: 0.9 mg/dL (ref 0.3–1.2)
Total Protein: 7.5 g/dL (ref 6.5–8.1)

## 2019-11-05 LAB — BRAIN NATRIURETIC PEPTIDE: B Natriuretic Peptide: 160.1 pg/mL — ABNORMAL HIGH (ref 0.0–100.0)

## 2019-11-05 LAB — TROPONIN I (HIGH SENSITIVITY)
Troponin I (High Sensitivity): 26 ng/L — ABNORMAL HIGH (ref <18)
Troponin I (High Sensitivity): 22 ng/L — ABNORMAL HIGH (ref <18)

## 2019-11-05 LAB — LACTIC ACID, PLASMA: Lactic Acid, Venous: 1.2 mmol/L (ref 0.5–1.9)

## 2019-11-05 LAB — GLUCOSE, CAPILLARY: Glucose-Capillary: 135 mg/dL — ABNORMAL HIGH (ref 70–99)

## 2019-11-05 LAB — PROCALCITONIN: Procalcitonin: 0.16 ng/mL

## 2019-11-05 LAB — SARS CORONAVIRUS 2 BY RT PCR (HOSPITAL ORDER, PERFORMED IN ~~LOC~~ HOSPITAL LAB): SARS Coronavirus 2: NEGATIVE

## 2019-11-05 MED ORDER — INSULIN ASPART 100 UNIT/ML ~~LOC~~ SOLN: 0.0000 [IU] | Freq: Every day | SUBCUTANEOUS | Status: DC

## 2019-11-05 MED ORDER — ACETAMINOPHEN 325 MG PO TABS
650.0000 mg | ORAL_TABLET | ORAL | Status: DC | PRN
  Administered 2019-11-06: 650 mg via ORAL
  Filled 2019-11-05: qty 2

## 2019-11-05 MED ORDER — ALLOPURINOL 100 MG PO TABS
100.0000 mg | ORAL_TABLET | Freq: Every day | ORAL | Status: DC
  Administered 2019-11-06 – 2019-11-07 (×2): 100 mg via ORAL
  Filled 2019-11-05 (×3): qty 1

## 2019-11-05 MED ORDER — SODIUM CHLORIDE 0.9 % IV SOLN: 250.0000 mL | INTRAVENOUS | Status: DC | PRN

## 2019-11-05 MED ORDER — LOSARTAN POTASSIUM 25 MG PO TABS
25.0000 mg | ORAL_TABLET | Freq: Every day | ORAL | Status: DC
  Administered 2019-11-06 – 2019-11-07 (×3): 25 mg via ORAL
  Filled 2019-11-05 (×3): qty 1

## 2019-11-05 MED ORDER — SPIRONOLACTONE 25 MG PO TABS
25.0000 mg | ORAL_TABLET | Freq: Every day | ORAL | Status: DC
  Administered 2019-11-06 – 2019-11-07 (×2): 25 mg via ORAL
  Filled 2019-11-05 (×4): qty 1

## 2019-11-05 MED ORDER — BRIMONIDINE TARTRATE 0.15 % OP SOLN
1.0000 [drp] | Freq: Two times a day (BID) | OPHTHALMIC | Status: DC
  Administered 2019-11-06 – 2019-11-07 (×4): 1 [drp] via OPHTHALMIC
  Filled 2019-11-05: qty 5

## 2019-11-05 MED ORDER — HEPARIN SODIUM (PORCINE) 5000 UNIT/ML IJ SOLN
5000.0000 [IU] | Freq: Three times a day (TID) | INTRAMUSCULAR | Status: DC
  Administered 2019-11-05 – 2019-11-06 (×4): 5000 [IU] via SUBCUTANEOUS
  Filled 2019-11-05 (×4): qty 1

## 2019-11-05 MED ORDER — DORZOLAMIDE HCL-TIMOLOL MAL 2-0.5 % OP SOLN
1.0000 [drp] | Freq: Two times a day (BID) | OPHTHALMIC | Status: DC
  Administered 2019-11-06 – 2019-11-07 (×4): 1 [drp] via OPHTHALMIC
  Filled 2019-11-05: qty 10

## 2019-11-05 MED ORDER — SODIUM CHLORIDE 0.9 % IV SOLN
100.0000 mg | Freq: Two times a day (BID) | INTRAVENOUS | Status: DC
  Administered 2019-11-05 – 2019-11-06 (×2): 100 mg via INTRAVENOUS
  Filled 2019-11-05 (×3): qty 100

## 2019-11-05 MED ORDER — SERTRALINE HCL 50 MG PO TABS
50.0000 mg | ORAL_TABLET | Freq: Every day | ORAL | Status: DC
  Administered 2019-11-06 – 2019-11-07 (×3): 50 mg via ORAL
  Filled 2019-11-05 (×3): qty 1

## 2019-11-05 MED ORDER — ASPIRIN 81 MG PO CHEW
81.0000 mg | CHEWABLE_TABLET | Freq: Every day | ORAL | Status: DC
  Administered 2019-11-06 – 2019-11-07 (×3): 81 mg via ORAL
  Filled 2019-11-05 (×3): qty 1

## 2019-11-05 MED ORDER — INSULIN ASPART 100 UNIT/ML ~~LOC~~ SOLN
0.0000 [IU] | Freq: Three times a day (TID) | SUBCUTANEOUS | Status: DC
  Administered 2019-11-06 – 2019-11-07 (×3): 1 [IU] via SUBCUTANEOUS
  Filled 2019-11-05 (×3): qty 1

## 2019-11-05 MED ORDER — SODIUM CHLORIDE 0.9% FLUSH: 3.0000 mL | INTRAVENOUS | Status: DC | PRN

## 2019-11-05 MED ORDER — SODIUM CHLORIDE 0.9 % IV SOLN
1.0000 g | INTRAVENOUS | Status: DC
  Administered 2019-11-05: 1 g via INTRAVENOUS
  Filled 2019-11-05: qty 10

## 2019-11-05 MED ORDER — FUROSEMIDE 10 MG/ML IJ SOLN
40.0000 mg | Freq: Two times a day (BID) | INTRAMUSCULAR | Status: DC
  Administered 2019-11-06 (×2): 40 mg via INTRAVENOUS
  Filled 2019-11-05 (×2): qty 4

## 2019-11-05 MED ORDER — SODIUM CHLORIDE 0.9% FLUSH
3.0000 mL | Freq: Two times a day (BID) | INTRAVENOUS | Status: DC
  Administered 2019-11-06 – 2019-11-07 (×3): 3 mL via INTRAVENOUS

## 2019-11-05 MED ORDER — METOPROLOL SUCCINATE ER 25 MG PO TB24
25.0000 mg | ORAL_TABLET | Freq: Every day | ORAL | Status: DC
  Administered 2019-11-06 – 2019-11-07 (×3): 25 mg via ORAL
  Filled 2019-11-05 (×3): qty 1

## 2019-11-05 MED ORDER — SODIUM CHLORIDE 0.9 % IV SOLN: Freq: Once | INTRAVENOUS | Status: AC

## 2019-11-05 MED ORDER — LATANOPROST 0.005 % OP SOLN
1.0000 [drp] | Freq: Every day | OPHTHALMIC | Status: DC
  Administered 2019-11-06 (×2): 1 [drp] via OPHTHALMIC
  Filled 2019-11-05: qty 2.5

## 2019-11-05 MED ORDER — ACETAMINOPHEN 500 MG PO TABS
500.0000 mg | ORAL_TABLET | Freq: Four times a day (QID) | ORAL | Status: DC | PRN
  Filled 2019-11-05: qty 1

## 2019-11-05 MED ORDER — PRAVASTATIN SODIUM 20 MG PO TABS
20.0000 mg | ORAL_TABLET | Freq: Every day | ORAL | Status: DC
  Administered 2019-11-06: 20 mg via ORAL
  Filled 2019-11-05: qty 1

## 2019-11-05 MED ORDER — INSULIN GLARGINE 100 UNIT/ML ~~LOC~~ SOLN
10.0000 [IU] | Freq: Every day | SUBCUTANEOUS | Status: DC
  Administered 2019-11-06 – 2019-11-07 (×2): 10 [IU] via SUBCUTANEOUS
  Filled 2019-11-05 (×3): qty 0.1

## 2019-11-05 NOTE — ED Notes (Signed)
Pt given more warm blankets.  

## 2019-11-05 NOTE — ED Notes (Signed)
Charge RN made aware this RN unable to address medication and blood work needs due to another emergency. No float nurse currently available to assist. Annie Main RN agreed to check on pt regularly. Visitor remains at bedside. Pt comfortable. Will address orders as soon as possible.

## 2019-11-05 NOTE — ED Notes (Signed)
Syndi NT at bedside.

## 2019-11-05 NOTE — ED Notes (Addendum)
Pt denies O2 use at home; denies feeling SOB. Rainbow: lt grn/blue/lav sent to lab.

## 2019-11-05 NOTE — Telephone Encounter (Signed)
Received communication from daughter that patient has had a poor intake and is now coughing. Amy NP updated on change in condition and to make visit today.

## 2019-11-05 NOTE — H&P (Signed)
History and Physical    [name] VOJ:[id number] DOB: [birth year] DOA: 11/05/2019  PCP: McLean-Scocuzza, Nino Glow, MD   Patient coming from:  Home  Chief Complaint: altered mental status, cough  HPI: [name] is a 86 y.o. female with medical history significant for DMT2, HTN, diastolic CHF, s/p aortic valve replacement, atherosclerosis, CKD 4, Gout, glaucoma, depression/anxiety who lives with her daughter and son in law. Presents for evaluation of increased confusion, decreased appetite and cough.  Daughter reports that for the last 4 days patient has had decreased activity level and has been more confused over her baseline level.  She has not been eating or drinking much the past few days.  For the last 3 days she has been laying in bed and does not get up and does not eat as much as she normally does.  A week ago she had a fall at home without loss of consciousness or head trauma.  She has developed a nonproductive cough over the last 3 to 4 days.  She has not had any fevers or chills.  Daughter reports he has not had any nausea or vomiting and has not complained of any abdominal pain, chest pain or palpitations.  Daughter reports she has no history of blood clots, DVTs or PEs.  She is not on any blood thinners but does take aspirin daily.  With her decreased energy level, appetite and developed confusion family became concerned so brought her for evaluation.  [name] has not had an echocardiogram in over a year according to the family.  They report her sugars have been stable but her last hemoglobin A1c was 4 to 5 months ago.  They did not give her any over-the-counter cough medications at home the past few days. She is vaccinated against Covid 19 as she received the vaccine in February of this year.  She has not been exposed any known sick contacts or COVID-19 exposures.  ED Course: In the emergency room [name] is found to have a mildly elevated BNP level with  bilateral basal opacities on chest x-ray which could represent infiltrate versus edema.  COVID-19 swab in the emergency room was negative.  Review of Systems:  General: Reports generalized weakness.no vascular lesion fever, chills, weight loss, night sweats.  Denies dizziness.  Denies change in appetite HENT: Denies head trauma, headache, denies change in hearing, tinnitus.  Denies nasal congestion or bleeding.  Denies sore throat, sores in mouth.  Denies difficulty swallowing Eyes: Denies blurry vision, pain in eye, drainage.  Denies discoloration of eyes. Neck: Denies pain.  Denies swelling.  Denies pain with movement. Cardiovascular: Denies chest pain, palpitations.  Denies edema.  Denies orthopnea Respiratory: Reports shortness of breath with nonproductive cough.  Denies wheezing.  Denies sputum production Gastrointestinal: Denies abdominal pain, swelling.  Denies nausea, vomiting, diarrhea.  Denies melena.  Denies hematemesis. Musculoskeletal: Denies limitation of movement.  Denies deformity or swelling.  Denies pain.  Denies arthralgias or myalgias. Genitourinary: Denies pelvic pain.  Denies urinary frequency or hesitancy.  Denies dysuria.  Skin: Denies rash.  Denies petechiae, purpura, ecchymosis. Neurological: Denies headache.  Denies syncope.  Denies seizure activity.  Denies weakness or paresthesia.  Denies slurred speech, drooping face.  Denies visual change. Psychiatric: Denies depression, anxiety.  Denies suicidal thoughts or ideation.  Denies hallucinations.  Past Medical History:  Diagnosis Date  . Anemia   . Anxiety   . Carotid arterial disease ([place])    a. 08/2014 U/S:  Bilateral < 50% stenosis. Patent vertebrals w/ antegrade flow.   . Cervical spine arthritis   . Chronic diastolic CHF (congestive heart failure) ([place])    a. echo 2014: EF 55-60%, DD;  b. 08/2014 Echo: EF 55-60%, no RWMA, GR1DD; c. 02/2016 Echo: EF 65-70%, Gr1 DD.  Marland Kitchen Chronic kidney disease   . Cognitive impairment    . Coronary artery disease   . Depression   . Diabetes mellitus without complication ([place])   . Dysrhythmia   . Essential hypertension   . GERD (gastroesophageal reflux disease)   . Glaucoma   . Gout   . Heart murmur   . History of renal impairment   . Hyperlipidemia   . Hypotension    a. Related to Norvasc - 11/2014 ED visit.  . Multinodular goiter    a. Noted incidentally on CT 07/2012 and carotid U/S 08/2014;  b. Nl TSH 11/2014.  Marland Kitchen Paroxysmal SVT (supraventricular tachycardia) (HCC)    a. on Toprol   . Prolapsed uterus   . Severe aortic stenosis    a. 11/15/16: s/p TAVR Edwards Sapien 3 THV (size 23 mm, model # 9600TFX, serial # [id number])  . Stroke [facility])    a. CT head 07/2014 showed small thalamic infarct  . Vitamin deficiency     Past Surgical History:  Procedure Laterality Date  . EYE SURGERY     unsure of what procedure, did know that laser was used  . IR RADIOLOGY PERIPHERAL GUIDED IV START  11/03/2016  . IR US GUIDE VASC ACCESS RIGHT  11/03/2016  . RIGHT/LEFT HEART CATH AND CORONARY ANGIOGRAPHY N/A 10/26/2016   Procedure: RIGHT/LEFT HEART CATH AND CORONARY ANGIOGRAPHY;  Surgeon: [name], MD;  Location: [facility];  Service: Cardiovascular;  Laterality: N/A;  . TEE WITHOUT CARDIOVERSION N/A 03/31/2016   Procedure: TRANSESOPHAGEAL ECHOCARDIOGRAM (TEE);  Surgeon: Minna Merritts, MD;  Location: [facility];  Service: Cardiovascular;  Laterality: N/A;  . TEE WITHOUT CARDIOVERSION N/A 11/15/2016   Procedure: TRANSESOPHAGEAL ECHOCARDIOGRAM (TEE);  Surgeon: [name], MD;  Location: [place];  Service: Open Heart Surgery;  Laterality: N/A;  . TRANSCATHETER AORTIC VALVE REPLACEMENT, TRANSFEMORAL N/A 11/15/2016   Procedure: TRANSCATHETER AORTIC VALVE REPLACEMENT, TRANSFEMORAL;  Surgeon: [name], MD;  Location: [place];  Service: Open Heart Surgery;  Laterality: N/A;  . TUBAL LIGATION      Social History  reports that she has never smoked. She has never used  smokeless tobacco. She reports that she does not drink alcohol and does not use drugs.  Allergies  Allergen Reactions  . Metformin Hcl Other (See Comments)    GI upset  . Metformin Other (See Comments), Diarrhea and Nausea And Vomiting    Other reaction(s): Other (See Comments), Unknown  . Metformin And Related Diarrhea    Family History  Problem Relation Age of Onset  . Glaucoma Mother   . Diabetes Mother   . Peptic Ulcer Father   . Colitis Father      Prior to Admission medications   Medication Sig Start Date End Date Taking? Authorizing Provider  allopurinol (ZYLOPRIM) 100 MG tablet Take 1 tablet (100 mg total) by mouth daily. 06/04/19  Yes McLean-Scocuzza, Nino Glow, MD  aspirin 81 MG chewable tablet Chew 1 tablet (81 mg total) by mouth daily. 04/15/18  Yes McLean-Scocuzza, Nino Glow, MD  brimonidine (ALPHAGAN P) 0.1 % SOLN Place 1 drop into the right eye 2 (two) times daily.   Yes [provider]  Carboxymethylcellulose Sodium (  ARTIFICIAL TEARS OP) Apply 1 drop to eye 2 (two) times daily. Left eye   Yes [provider]  cholecalciferol (VITAMIN D) 25 MCG (1000 UT) tablet Take 1 tablet (1,000 Units total) by mouth daily. 04/15/18  Yes McLean-Scocuzza, Nino Glow, MD  Dorzolamide HCl-Timolol Mal (COSOPT OP) Place 1 drop into the right eye 2 (two) times daily.    Yes [provider]  ferrous sulfate 220 (44 Fe) MG/5ML solution Take 5 mLs (220 mg total) by mouth daily with breakfast. 10/04/19  Yes McLean-Scocuzza, Nino Glow, MD  gabapentin (NEURONTIN) 100 MG capsule TAKE 1 CAPSULE BY MOUTH EVERY DAY 1 HOUR BEFORE BEDTIME Patient taking differently: Take 100 mg by mouth at bedtime.  10/21/19  Yes Arnett, Yvetta Coder, FNP  gabapentin (NEURONTIN) 300 MG capsule Take 1 capsule (300 mg total) by mouth 2 (two) times daily. 05/10/19  Yes McLean-Scocuzza, Nino Glow, MD  insulin aspart (NOVOLOG FLEXPEN) 100 UNIT/ML FlexPen TID before meals 15-30 minutes based on sugar reading before  meals If sugar: 70-130 0 units, 131-180 2 units, 181-240 4 units, 241-300 6 units, 301-350 8 units, 351-400 10 units, >400 12 units and call doctor 04/29/19  Yes McLean-Scocuzza, Nino Glow, MD  insulin degludec (TRESIBA FLEXTOUCH) 100 UNIT/ML FlexTouch Pen Inject 0.1 mLs (10 Units total) into the skin daily. 07/05/19  Yes McLean-Scocuzza, Nino Glow, MD  latanoprost (XALATAN) 0.005 % ophthalmic solution Place 1 drop into the right eye at bedtime.    Yes [provider]  LORazepam (ATIVAN) 1 MG tablet Take 1 tablet (1 mg total) by mouth at bedtime as needed for anxiety. 07/25/19  Yes McLean-Scocuzza, Nino Glow, MD  lovastatin (MEVACOR) 20 MG tablet Take 1 tablet (20 mg total) by mouth at bedtime. Note reduced dose due to age and chronic medical issues d/c 40 mg dose 05/06/19  Yes McLean-Scocuzza, Nino Glow, MD  metoprolol succinate (TOPROL-XL) 25 MG 24 hr tablet Take 1 tablet (25 mg total) by mouth daily. 07/30/19  Yes McLean-Scocuzza, Nino Glow, MD  Multiple Vitamin (MULTI-VITAMIN PO) Take 1 tablet by mouth daily.   Yes [provider]  pantoprazole (PROTONIX) 40 MG tablet Take 1 tablet (40 mg total) by mouth daily. 30 minutes before breakfast OR dinner 04/23/19  Yes McLean-Scocuzza, Nino Glow, MD  pilocarpine (PILOCAR) 4 % ophthalmic solution Place 1 drop into the right eye 4 (four) times daily.    Yes [provider]  Semaglutide,0.25 or 0.5MG/DOS, (OZEMPIC, 0.25 OR 0.5 MG/DOSE,) 2 MG/1.5ML SOPN Inject 0.25 mg into the skin once a week. 06/13/19  Yes McLean-Scocuzza, Nino Glow, MD  SEROQUEL 25 MG tablet Take 25 mg by mouth at bedtime.  11/01/19  Yes [provider]  sertraline (ZOLOFT) 50 MG tablet Take 1 tablet (50 mg total) by mouth daily. 06/04/19  Yes McLean-Scocuzza, Nino Glow, MD  sitaGLIPtin (JANUVIA) 25 MG tablet Take 1 tablet (25 mg total) by mouth daily. D/c 50 mg d/c glipizide 2.5 mg daily 07/03/19  Yes McLean-Scocuzza, Nino Glow, MD  spironolactone (ALDACTONE) 25 MG tablet Take 25 mg by  mouth daily. 09/23/19  Yes [provider]  traZODone (DESYREL) 100 MG tablet Take 0.5-1 tablets (50-100 mg total) by mouth at bedtime as needed. 06/05/19  Yes McLean-Scocuzza, Nino Glow, MD  ACCU-CHEK AVIVA PLUS test strip USE 1 STRIP TO TEST BLOOD SUGARS 3 TIMES A DAY 04/15/18   McLean-Scocuzza, Nino Glow, MD  acetaminophen (TYLENOL) 500 MG tablet Take 1 tablet (500 mg total) by mouth every 6 (six)  hours as needed for mild pain or headache. 04/15/18   McLean-Scocuzza, Nino Glow, MD  blood glucose meter kit and supplies Dispense based on patient and insurance preference. Use 1x daily as directed. (FOR ICD-10 E10.9, E11.9). 11/30/17   McLean-Scocuzza, Nino Glow, MD  clotrimazole (LOTRIMIN) 1 % cream APPLY TOPICALLY TO THE AFFECTED AREA ONCE A DAY AT BEDTIME FOR 7-14 DAYS 06/04/19   McLean-Scocuzza, Nino Glow, MD  Continuous Blood Gluc Receiver (FREESTYLE LIBRE 14 DAY READER) DEVI 1 Device by Does not apply route every 14 (fourteen) days. 07/01/19   McLean-Scocuzza, Nino Glow, MD  Continuous Blood Gluc Sensor (FREESTYLE LIBRE 14 DAY SENSOR) MISC 1 Device by Does not apply route every 14 (fourteen) days. 07/01/19   McLean-Scocuzza, Nino Glow, MD  dimethicone (PROSHIELD PLUS SKIN PROTECTANT) 1 % cream 1 application to the buttock 2 times daily to help protect skin 04/15/18   McLean-Scocuzza, Nino Glow, MD  glucose blood test strip Accu-Chek Aviva Plus test strips 04/15/18   McLean-Scocuzza, Nino Glow, MD  glucose blood test strip qid Use as instructed E11.9 07/18/19   McLean-Scocuzza, Nino Glow, MD  Insulin Pen Needle (PEN NEEDLES) 30G X 8 MM MISC 1 Device by Does not apply route once a week. 03/13/19   McLean-Scocuzza, Nino Glow, MD  Insulin Pen Needle (PEN NEEDLES) 30G X 8 MM MISC 1 Device by Does not apply route 3 (three) times daily before meals. 04/21/19   McLean-Scocuzza, Nino Glow, MD  Insulin Pen Needle (PEN NEEDLES) 31G X 8 MM MISC 1 pen by Does not apply route daily. 07/03/19   McLean-Scocuzza, Nino Glow, MD  Lancet Device MISC 1  Device by Does not apply route as directed. 05/13/19   McLean-Scocuzza, Nino Glow, MD  Lancets [facility] ULTRASOFT) lancets Bid E 11.9 Use as instructed 12/28/18   McLean-Scocuzza, Nino Glow, MD  metroNIDAZOLE (METROGEL) 0.75 % vaginal gel Place 1 Applicatorful vaginally 2 (two) times daily. Prn BV 06/13/19   McLean-Scocuzza, Nino Glow, MD  [facility] DELICA LANCETS 82U MISC USE TO TEST BLOOD SUGAR ONCE A DAY 11/30/17   [provider]  Potassium Chloride ER 20 MEQ TBCR Take 20 mEq by mouth as directed. Take once daily and extra pill with any extra torsemide Patient taking differently: Take 20 mEq by mouth as directed. Take once daily and extra pill with any extra torsemide 09/10/19   Minna Merritts, MD  torsemide (DEMADEX) 20 MG tablet Take 2 tablets (40 mg total) by mouth as directed. Take 40MG by mouth every day with extra 40MG after lunch for weight greater than 180 lbs Patient taking differently: Take 40 mg by mouth 2 (two) times daily. Take 40MG by mouth every day with extra 40MG after lunch for weight greater than 180 lbs 09/10/19   Minna Merritts, MD  traMADol (ULTRAM) 50 MG tablet Take 50 mg by mouth every 8 (eight) hours as needed. 11/01/19   [provider]    Physical Exam: Vitals:   11/05/19 1715 11/05/19 1730 11/05/19 1745 11/05/19 1800  BP:  125/64  131/72  Pulse: 70 69 69 68  Resp:      Temp:      TempSrc:      SpO2: 100% 100% 100% 100%  Weight:      Height:        Constitutional: NAD, calm, comfortable Vitals:   11/05/19 1715 11/05/19 1730 11/05/19 1745 11/05/19 1800  BP:  125/64  131/72  Pulse: 70 69 69 68  Resp:  Temp:      TempSrc:      SpO2: 100% 100% 100% 100%  Weight:      Height:       General: WDWN, Alert and oriented to person and place.  Eyes: EOMI, PERRL, lids and conjunctivae normal.  Sclera nonicteric HENT:  [place]/AT, external ears normal.  Nares patent without epistasis.  Mucous membranes are dry. Posterior pharynx clear of any exudate or  lesions  Neck: Soft, normal range of motion, supple, no masses, no thyromegaly. Trachea midline Respiratory: Diminished breath sounds in bases.  Bibasilar crackles.  Diffuse Rales.  No wheezing, Normal respiratory effort. No accessory muscle use.  Cardiovascular: Regular rate and rhythm, 2/6 systolic murmur. No rubs / gallops.  Mild lower extremity edema. 1+ pedal pulses. Gastro: Abdomen soft nondistended nontender.  Normoactive bowel sounds.  No masses.  No rebound or guarding. Musculoskeletal: FROM. no clubbing / cyanosis. No joint deformity upper and lower extremities. no contractures. Normal muscle tone.  Skin: Warm, dry, intact no rashes, lesions, ulcers. No induration Neurologic: CN 2-12 grossly intact.  Normal speech.  Sensation intact, patella DTR +1 bilaterally. Strength 4 all is also next remember me off a free past for me and is now the [place] called the Emet mom /5 in all extremities.   Psychiatric: Normal judgment and insight.  Normal mood.    Labs on Admission: I have personally reviewed following labs and imaging studies  CBC: Recent Labs  Lab 11/05/19 1425  WBC 12.0*  NEUTROABS 9.8*  HGB 11.3*  HCT 34.8*  MCV 95.9  PLT 938    Basic Metabolic Panel: Recent Labs  Lab 11/05/19 1425  NA 141  K 4.3  CL 104  CO2 27  GLUCOSE 221*  BUN 23  CREATININE 1.36*  CALCIUM 9.0    GFR: Estimated Creatinine Clearance: 25.1 mL/min (A) (by C-G formula based on SCr of 1.36 mg/dL (H)).  Liver Function Tests: Recent Labs  Lab 11/05/19 1425  AST 25  ALT 13  ALKPHOS 50  BILITOT 0.9  PROT 7.5  ALBUMIN 3.2*    Urine analysis:    Component Value Date/Time   COLORURINE YELLOW 08/14/2019 1658   APPEARANCEUR CLEAR 08/14/2019 1658   APPEARANCEUR Cloudy (A) 01/04/2018 0841   LABSPEC 1.015 08/14/2019 1658   LABSPEC 1.015 06/16/2014 1652   PHURINE > OR = 8.5 (A) 08/14/2019 1658   GLUCOSEU NEGATIVE 08/14/2019 1658   GLUCOSEU NEGATIVE 04/11/2018 0941   HGBUR NEGATIVE  08/14/2019 1658   [place] 04/11/2018 0941   BILIRUBINUR Negative 01/04/2018 0841   BILIRUBINUR Negative 06/16/2014 1652   KETONESUR NEGATIVE 08/14/2019 1658   PROTEINUR NEGATIVE 08/14/2019 1658   UROBILINOGEN 0.2 04/11/2018 0941   NITRITE NEGATIVE 08/14/2019 1658   LEUKOCYTESUR 1+ (A) 08/14/2019 1658   LEUKOCYTESUR 3+ 06/16/2014 1652    Radiological Exams on Admission: CT Head Wo Contrast  Result Date: 11/05/2019 CLINICAL DATA:  Altered level of consciousness, failure to thrive, un witnessed fall 1 week ago EXAM: CT HEAD WITHOUT CONTRAST TECHNIQUE: Contiguous axial images were obtained from the base of the skull through the vertex without intravenous contrast. COMPARISON:  07/16/2015 FINDINGS: Brain: No acute infarct or hemorrhage. Chronic lacunar infarct right thalamus. Lateral ventricles and remaining midline structures are unremarkable. No acute extra-axial fluid collections. No mass effect. Vascular: No hyperdense vessel or unexpected calcification. Skull: Normal. Negative for fracture or focal lesion. Sinuses/Orbits: No acute finding. Other: None. IMPRESSION: 1. Stable head CT, no acute process. Electronically Signed  By: Randa Ngo M.D.   On: 11/05/2019 16:34   DG Chest Portable 1 View  Result Date: 11/05/2019 CLINICAL DATA:  Hypoxia. EXAM: PORTABLE CHEST 1 VIEW COMPARISON:  May 21, 2018. FINDINGS: Stable cardiomegaly. No pneumothorax or pleural effusion is noted. Bilateral patchy airspace opacities are noted most consistent with pneumonia or possibly edema. Bony thorax is unremarkable. IMPRESSION: Bilateral patchy airspace opacities are noted most consistent with pneumonia or possibly edema. Aortic Atherosclerosis (ICD10-I70.0). Electronically Signed   By: Marijo Conception M.D.   On: 11/05/2019 15:48    EKG: Independently reviewed.  EKG reviewed.  Sinus rhythm with PVCs noted.  No acute ST elevation or depression.  QTc is 534  Assessment/Plan Principal Problem:    Acute on chronic diastolic CHF (congestive heart failure) ([place]) [name] is admitted to cardiac floor.  Serial cardiac enzymes will be obtained overnight. Patient be diuresed with Lasix 40 mg IV twice daily for the next 2 days.  Monitor I&O's.  Monitor daily weights. Obtain echocardiogram in the morning Patient be continued on metoprolol.  We will add ARB therapy with losartan.  Continue home dose of spironolactone Doppler ultrasound of legs were ordered to evaluate for DVT.  Active Problems:    CAP (community acquired pneumonia) Patient replaced on Rocephin and doxycycline empirically for pneumonia that is present on chest x-ray.  CT scan without contrast been ordered for better evaluation of lung fields to classify as edema versus infiltrate White blood cell count is mild elevated at 12,000.  No signs of sepsis at this time    Hypertension Continue home dose of metoprolol.  Losartan added to patient regimen.  Monitor blood pressure.    CKD (chronic kidney disease) stage 4, GFR 15-29 ml/min (HCC) Stable chronic kidney disease.    Generalized weakness Consult physical therapy morning for evaluation.  Any home health needs that are identified will be arranged before discharge.  Patient does have a palliative care nurse at home already.    Diabetes mellitus type 2 in nonobese (HCC) Continue basal insulin.  Monitor blood sugars with meals and at bedtime.  Sliding scale insulin provided as needed for glycemic control. Check hemoglobin A1c level.       Prolonged QT interval We will monitor with cardiac telemetry.  Will avoid medications that can further prolong QTC    Leukocytosis Mild leukocytosis with white blood cell count 12,000.  Recheck CBC in morning    Altered mental status Patient had mild confusion at home.  Patient is now alert and oriented to person and place.    S/P aortic valve replacement      DVT prophylaxis: Padua score elevated. Heparin for DVT prophylaxis.    Code Status:   Full code. Code status discussed with daughter who is at bedside and confirms Full code status  Family Communication:   Diagnosis and plan discussed with patient and family members who are at bedside.  Questions answered.  They verbalized understanding and agree with plan.  Further recommendation to follow as clinical indicated  Disposition Plan:   Patient is from:  Home  Anticipated DC to:  Home  Anticipated DC date:  Anticipate greater than 2 midnights in hospital to treat acute medical  condition  Anticipated DC barriers: No barriers to discharge identified at this time   Admission status:  Inpatient  Severity of Illness: The appropriate patient status for this patient is INPATIENT. Inpatient status is judged to be reasonable and necessary in order to provide the required intensity of service to ensure the patient's safety. The patient's presenting symptoms, physical exam findings, and initial radiographic and laboratory data in the context of their chronic comorbidities is felt to place them at high risk for further clinical deterioration. Furthermore, it is not anticipated that the patient will be medically stable for discharge from the hospital within 2 midnights of admission. The following factors support the patient status of inpatient.   * I certify that at the point of admission it is my clinical judgment that the patient will require inpatient hospital care spanning beyond 2 midnights from the point of admission due to high intensity of service, high risk for further deterioration and high frequency of surveillance required.Yevonne Aline Isabell Bonafede MD [facility]  How to contact the [facility] Attending or Consulting provider [place] or covering provider during after hours [place], for this patient?   1. Check the care team in [facility] and look for a) attending/consulting [facility] provider listed and b) the [facility] team  listed 2. Log into [email or web address] and use [facility]'s universal password to access. If you do not have the password, please contact the hospital operator. 3. Locate the [facility] provider you are looking for under [facility] and page to a number that you can be directly reached. 4. If you still have difficulty reaching the provider, please page the [facility] (Director on Call) for the Hospitalists listed on amion for assistance.  11/05/2019, 9:30 PM

## 2019-11-05 NOTE — ED Notes (Signed)
NT at bedside. Visitors remain at bedside.

## 2019-11-05 NOTE — ED Notes (Signed)
EKG completed

## 2019-11-05 NOTE — Progress Notes (Signed)
[place] Room ED24 [employer] engineer [facility]) Hospital Liaison RN note:   This patient is currently followed by out patient palliative care with [facility]. We will follow for disposition and update the team.   Thank you.  Zandra Abts, RN [facility] Liaison [phone]

## 2019-11-05 NOTE — Telephone Encounter (Signed)
Sent to fax.

## 2019-11-05 NOTE — Progress Notes (Signed)
[facility] Consult Note Telephone: [phone]  Fax: [phone]  PATIENT NAME: [name] DOB: [birth year] MRN: [id number]  PRIMARY CARE PROVIDER:   McLean-Scocuzza, Nino Glow, MD  REFERRING PROVIDER:  McLean-Scocuzza, Nino Glow, MD [place],  [employer] [place] [address]  RESPONSIBLE PARTY:   Percell Locus, daughter [phone]   RECOMMENDATIONS and PLAN: 1.Advanced care planning. Patient is full code.  2.  Adult failure to thrive.  Family states the patient has had decreased eating and drinking for past 4 days.  States that caregiver has been using a syringe to get her to take sips of fluids.  Patient has not been taking her medications.  Family has noticed increased cough, gargling, wheezing, is using more pursed lip breathing and having increased shortness of breath.  She has decreased urination due to decreased intake.  Patient did have a fall last week.  Family does state that they have been trying to use tramadol for her pain but have noticed that once the pain medication wears off that she is feeling tender all over and complains of pain whenever being touched.  Denies fever, N/V/D, constipation, dysuria, hematuria.  O2 sats are staying in the 80s.  Patient has no home oxygen.  Called EMS and waited for them to come and gave report upon their arrival.  Patient transported to [facility].  I spent 60 minutes providing this consultation,  from 12:30 to 1:30 including time spent with patient/family, chart review, provider coordination, documentaion. More than 50% of the time in this consultation was spent coordinating communication.   HISTORY OF PRESENT ILLNESS:  [name] is a 86 y.o. 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 Palliative Care was asked to help address goals  of care.   CODE STATUS: full code  PPS: 40% HOSPICE ELIGIBILITY/DIAGNOSIS: TBD  PHYSICAL EXAM:  BP 150/88  HR  84  O2 82-85% General: NAD, frail appearing Cardiovascular: regular rate and rhythm Pulmonary: wheezing heard throughout; does use pursed lip breathing Abdomen: soft, nontender, + bowel sounds GU: no suprapubic tenderness Extremities: no edema, no joint deformities Skin: no rashes on exposed skin Neurological: Weakness; forgetfulness   PAST MEDICAL HISTORY:  Past Medical History:  Diagnosis Date  . Anemia   . Carotid arterial disease ([place])    a. 08/2014 U/S: Bilateral < 50% stenosis. Patent vertebrals w/ antegrade flow.   . Cervical spine arthritis   . Chronic diastolic CHF (congestive heart failure) ([place])    a. echo 2014: EF 55-60%, DD;  b. 08/2014 Echo: EF 55-60%, no RWMA, GR1DD; c. 02/2016 Echo: EF 65-70%, Gr1 DD.  Marland Kitchen Cognitive impairment   . Depression   . Diabetes mellitus without complication ([place])   . Essential hypertension   . GERD (gastroesophageal reflux disease)   . Glaucoma   . Gout   . History of renal impairment   . Hyperlipidemia   . Hypotension    a. Related to Norvasc - 11/2014 ED visit.  . Multinodular goiter    a. Noted incidentally on CT 07/2012 and carotid U/S 08/2014;  b. Nl TSH 11/2014.  Marland Kitchen Paroxysmal SVT (supraventricular tachycardia) (HCC)    a. on Toprol   . Prolapsed uterus   . Severe aortic stenosis    a. 11/15/16: s/p TAVR Edwards Sapien 3 THV (size 23 mm, model # 9600TFX, serial #  [id number])  . Stroke [facility])    a. CT head 07/2014 showed small thalamic infarct  . Vitamin deficiency     SOCIAL HX:  Social History   Tobacco Use  . Smoking status: Never Smoker  . Smokeless tobacco: Never Used  Substance Use Topics  . Alcohol use: No    ALLERGIES:  Allergies  Allergen Reactions  . Metformin Hcl Other (See Comments)    GI upset  . Metformin Other (See Comments), Diarrhea and Nausea And Vomiting    Other reaction(s): Other (See  Comments), Unknown  . Metformin And Related Diarrhea     PERTINENT MEDICATIONS:  Outpatient Encounter Medications as of 11/05/2019  Medication Sig  . ACCU-CHEK AVIVA PLUS test strip USE 1 STRIP TO TEST BLOOD SUGARS 3 TIMES A DAY  . acetaminophen (TYLENOL) 500 MG tablet Take 1 tablet (500 mg total) by mouth every 6 (six) hours as needed for mild pain or headache.  . allopurinol (ZYLOPRIM) 100 MG tablet Take 1 tablet (100 mg total) by mouth daily.  Marland Kitchen aspirin 81 MG chewable tablet Chew 1 tablet (81 mg total) by mouth daily.  . blood glucose meter kit and supplies Dispense based on patient and insurance preference. Use 1x daily as directed. (FOR ICD-10 E10.9, E11.9).  . brimonidine (ALPHAGAN P) 0.1 % SOLN Place 1 drop into the right eye 2 (two) times daily.  . Carboxymethylcellulose Sodium (ARTIFICIAL TEARS OP) Apply 1 drop to eye 2 (two) times daily. Left eye  . cholecalciferol (VITAMIN D) 25 MCG (1000 UT) tablet Take 1 tablet (1,000 Units total) by mouth daily.  . clotrimazole (LOTRIMIN) 1 % cream APPLY TOPICALLY TO THE AFFECTED AREA ONCE A DAY AT BEDTIME FOR 7-14 DAYS  . Continuous Blood Gluc Receiver (FREESTYLE LIBRE 14 DAY READER) DEVI 1 Device by Does not apply route every 14 (fourteen) days.  . Continuous Blood Gluc Sensor (FREESTYLE LIBRE 14 DAY SENSOR) MISC 1 Device by Does not apply route every 14 (fourteen) days.  . dimethicone (PROSHIELD PLUS SKIN PROTECTANT) 1 % cream 1 application to the buttock 2 times daily to help protect skin  . Dorzolamide HCl-Timolol Mal (COSOPT OP) Place 1 drop into the right eye 2 (two) times daily.   . ferrous sulfate 220 (44 Fe) MG/5ML solution Take 5 mLs (220 mg total) by mouth daily with breakfast.  . gabapentin (NEURONTIN) 100 MG capsule TAKE 1 CAPSULE BY MOUTH EVERY DAY 1 HOUR BEFORE BEDTIME (Patient taking differently: Take 100 mg by mouth at bedtime. )  . gabapentin (NEURONTIN) 300 MG capsule Take 1 capsule (300 mg total) by mouth 2 (two) times daily.  Marland Kitchen  glucose blood test strip Accu-Chek Aviva Plus test strips  . glucose blood test strip qid Use as instructed E11.9  . insulin aspart (NOVOLOG FLEXPEN) 100 UNIT/ML FlexPen TID before meals 15-30 minutes based on sugar reading before meals If sugar: 70-130 0 units, 131-180 2 units, 181-240 4 units, 241-300 6 units, 301-350 8 units, 351-400 10 units, >400 12 units and call doctor  . insulin degludec (TRESIBA FLEXTOUCH) 100 UNIT/ML FlexTouch Pen Inject 0.1 mLs (10 Units total) into the skin daily.  . Insulin Pen Needle (PEN NEEDLES) 30G X 8 MM MISC 1 Device by Does not apply route once a week.  . Insulin Pen Needle (PEN NEEDLES) 30G X 8 MM MISC 1 Device by Does not apply route 3 (three) times daily before meals.  . Insulin Pen Needle (PEN NEEDLES) 31G X 8 MM MISC 1 pen  by Does not apply route daily.  Elmore Guise Device MISC 1 Device by Does not apply route as directed.  . Lancets (ONETOUCH ULTRASOFT) lancets Bid E 11.9 Use as instructed  . latanoprost (XALATAN) 0.005 % ophthalmic solution Place 1 drop into the right eye at bedtime.   Marland Kitchen LORazepam (ATIVAN) 1 MG tablet Take 1 tablet (1 mg total) by mouth at bedtime as needed for anxiety.  . lovastatin (MEVACOR) 20 MG tablet Take 1 tablet (20 mg total) by mouth at bedtime. Note reduced dose due to age and chronic medical issues d/c 40 mg dose  . metoprolol succinate (TOPROL-XL) 25 MG 24 hr tablet Take 1 tablet (25 mg total) by mouth daily.  . metroNIDAZOLE (METROGEL) 0.75 % vaginal gel Place 1 Applicatorful vaginally 2 (two) times daily. Prn BV  . Multiple Vitamin (MULTI-VITAMIN PO) Take 1 tablet by mouth daily.  Glory Rosebush DELICA LANCETS 55O MISC USE TO TEST BLOOD SUGAR ONCE A DAY  . pantoprazole (PROTONIX) 40 MG tablet Take 1 tablet (40 mg total) by mouth daily. 30 minutes before breakfast OR dinner  . pilocarpine (PILOCAR) 4 % ophthalmic solution Place 1 drop into the right eye 4 (four) times daily.   . Potassium Chloride ER 20 MEQ TBCR Take 20 mEq by mouth  as directed. Take once daily and extra pill with any extra torsemide (Patient taking differently: Take 20 mEq by mouth as directed. Take once daily and extra pill with any extra torsemide)  . Semaglutide,0.25 or 0.5MG/DOS, (OZEMPIC, 0.25 OR 0.5 MG/DOSE,) 2 MG/1.5ML SOPN Inject 0.25 mg into the skin once a week.  . SEROQUEL 25 MG tablet Take 25 mg by mouth at bedtime.   . sertraline (ZOLOFT) 50 MG tablet Take 1 tablet (50 mg total) by mouth daily.  . sitaGLIPtin (JANUVIA) 25 MG tablet Take 1 tablet (25 mg total) by mouth daily. D/c 50 mg d/c glipizide 2.5 mg daily  . spironolactone (ALDACTONE) 25 MG tablet Take 25 mg by mouth daily.  Marland Kitchen torsemide (DEMADEX) 20 MG tablet Take 2 tablets (40 mg total) by mouth as directed. Take 40MG by mouth every day with extra 40MG after lunch for weight greater than 180 lbs (Patient taking differently: Take 40 mg by mouth 2 (two) times daily. Take 40MG by mouth every day with extra 40MG after lunch for weight greater than 180 lbs)  . traMADol (ULTRAM) 50 MG tablet Take 50 mg by mouth every 8 (eight) hours as needed.  . traZODone (DESYREL) 100 MG tablet Take 0.5-1 tablets (50-100 mg total) by mouth at bedtime as needed.   No facility-administered encounter medications on file as of 11/05/2019.     Janaki Exley Jenetta Downer, NP

## 2019-11-05 NOTE — ED Notes (Signed)
Visitor remains at bedside. Annie Main RN states checked on pt. Bed locked low. Rails up. Call bell within reach.

## 2019-11-05 NOTE — ED Triage Notes (Signed)
Pt in via EMS from home d/t failure to thrive. Family/nurse visits home per EMS. Pt alert and able to answer questions with EMS. History CHF; kidney disease; DM2; LBBB. 20g R ac IV by EMS. 220 BG with EMS. 89% RA with EMS; pt placed on 4L inc sat to mid 90s. Unwitnessed fall a week ago that pt was no seen for. Pt denies pain but grunts at times during movement.

## 2019-11-05 NOTE — ED Notes (Signed)
Visitors at bedside.

## 2019-11-05 NOTE — ED Provider Notes (Signed)
Dragon is not working  CC  Failure to thrive. Hx limited by pt not speaking much  hpi for last several days patient not eating, not drinking much. Not as responsive as usual. Patient fell about 1 week ago. Additionally O2 sats low on room air-89%. On o2 sats come up to mid 90's . Pt w/o cough . Doesn't sees short of breath. Patient follows all commands well. No fever at home. PMH Diabetes mellitus (CMS-HCC)    Hypertension    Aortic valve prosthesis present    MEDS latanoprost (XALATAN) 0.005 % ophthalmic solution  Place 1 drop into the right eye once daily.  0   Active  gabapentin (NEURONTIN) 300 MG capsule  Take 300 mg by mouth 2 (two) times daily.   0   Active  LACTOSE-REDUCED FOOD (ENSURE ORAL)  8 oz 4 (four) times daily.  0   Active  aspirin 81 MG EC tablet  81 mg once daily.   0   Active  magnesium 250 mg Tab  Take 2 tablets by mouth once daily.   0   Active  cholecalciferol (VITAMIN D3) 1,000 unit capsule  2 capsules daily  0   Active  colchicine (COLCRYS) 0.6 mg tablet  Take 0.6 mg by mouth 3 (three) times daily.   0   Active  cyclobenzaprine (FLEXERIL) 10 MG tablet  Take 10 mg by mouth 3 (three) times daily as needed.   0   Active  HYDROcodone-acetaminophen (NORCO) 5-325 mg tablet  1 tablet every 6 (six) hours as needed.  0   Active  glimepiride (AMARYL) 1 MG tablet  TAKE 1 TABLET BY MOUTH 30 MINUTES BEFOREBREAKFAST (8AM)  0 06/20/2013  Active  fluocinolone (DERMA-SMOOTHE) 0.01 % external oil  Apply to affected area daily after bath  0 02/07/2012  Active  dorzolamide-timolol (COSOPT) 22.3-6.8 mg/mL ophthalmic solution  Apply 1 drop to eye.  0   Active  lisinopril (PRINIVIL,ZESTRIL) 40 MG tablet  Take 40 mg by mouth once daily.   0   Active  cefUROXime (CEFTIN) 250 MG tablet  Take 1 tablet (250 mg total) by mouth 2 (two) times daily. 14 tablet  0 12/16/2014  Active  doxazosin (CARDURA) 4 MG tablet  Take 4 mg by mouth 2 (two)  times daily.  0   Active  tiZANidine (ZANAFLEX) 4 MG tablet  Take 4 mg by mouth 3 (three) times daily as needed.  0 11/25/2014  Active  brimonidine (ALPHAGAN P) 0.1 % ophthalmic solution  Place 1 drop into the right eye 2 (two) times daily.  0   Active  lovastatin (MEVACOR) 40 MG tablet  Take 40 mg by mouth nightly.  0 12/07/2015  Active  metoprolol succinate (TOPROL-XL) 25 MG XL tablet  Take 25 mg by mouth. Take 3 tablets (75MG ) by mouth daily  0 03/30/2016  Active  pilocarpine (ISOPTO CARPINE) 4 % ophthalmic solution  Place 1 drop into the right eye 4 (four) times daily.  0   Active  pantoprazole (PROTONIX) 40 MG DR tablet  Take 40 mg by mouth 2 (two) times daily.  0 01/14/2016  Active  allopurinol (ZYLOPRIM) 300 MG tablet  Take 300 mg by mouth once daily.  0   Active  lidocaine (LIDODERM) 5 % patch  Place onto the skin.  0 04/01/2016  Active  traMADol (ULTRAM) 50 mg tablet  Take 50 mg by mouth every 6 (six) hours as needed.  0 04/01/2016  Active  iron polysaccharides (FERREX) 150  mg iron capsule  Take 150 mg by mouth 2 (two) times daily.  0   Active  fluconazole (DIFLUCAN) 150 MG tablet  Take 1 tablet (150 mg total) by mouth once daily       All;ergies Metformin  SH  Lives at home  PE  Constitutional wdwn f follows commands but does not look at me is very slow to respond Head NCAT Nose: No discharge Eyes: Unable to evaluate well as patient will not hold her head up well enough for me to see her eyes well.  She is not complaining about anything with her eyes however when asked. Neck no stridor Heart regular rate and rhythm no audible murmurs Lungs are clear no respiratory distress Abdomen soft bowel sounds are positive there is no palpable organomegaly Back nontender to palpation Extremities trace edema bilaterally no apparent tenderness  Assessment and plan: Patient SARS test is negative.  Her WBC count is elevated somewhat and her polys are  somewhat elevated as well but her procalcitonin is negative.  Her chest x-ray looks most like CHF if it is not Covid.  Her main complaint however is just not eating or drinking and not acting like herself.  She was hypoxic off of oxygen but this seems to be due to CHF.  Her BNP is elevated.  I will asked the hospitalist to admit her.  She will need oxygen and possibly diuresis and further evaluation.  I do not see an old history of CHF in her records. Diagnosis: Failure to thrive Hypoxia    Nena Polio, MD 11/06/19 0005

## 2019-11-05 NOTE — ED Notes (Signed)
Charge RN Annie Main checked in on pt.

## 2019-11-06 ENCOUNTER — Inpatient Hospital Stay (HOSPITAL_COMMUNITY)
Admit: 2019-11-06 | Discharge: 2019-11-06 | Disposition: A | Discharge status: Home / Self Care | Payer: PPO | Attending: Family Medicine | Admitting: Family Medicine

## 2019-11-06 DIAGNOSIS — I5033 Acute on chronic diastolic (congestive) heart failure: Secondary | ICD-10-CM

## 2019-11-06 DIAGNOSIS — I509 Heart failure, unspecified: Secondary | ICD-10-CM

## 2019-11-06 LAB — FIBRIN DERIVATIVES D-DIMER (ARMC ONLY): Fibrin derivatives D-dimer (ARMC): 3486.38 ng/mL (FEU) — ABNORMAL HIGH (ref 0.00–499.00)

## 2019-11-06 LAB — CREATININE, SERUM
Creatinine, Ser: 1.04 mg/dL — ABNORMAL HIGH (ref 0.44–1.00)
GFR calc Af Amer: 54 mL/min — ABNORMAL LOW (ref >60)
GFR calc non Af Amer: 46 mL/min — ABNORMAL LOW (ref >60)

## 2019-11-06 LAB — CBC
HCT: 41.5 % (ref 36.0–46.0)
Hemoglobin: 13.8 g/dL (ref 12.0–15.0)
MCH: 31.6 pg (ref 26.0–34.0)
MCHC: 33.3 g/dL (ref 30.0–36.0)
MCV: 95 fL (ref 80.0–100.0)
Platelets: 153 10*3/uL (ref 150–400)
RBC: 4.37 MIL/uL (ref 3.87–5.11)
RDW: 15.3 % (ref 11.5–15.5)
WBC: 9 10*3/uL (ref 4.0–10.5)
nRBC: 0 % (ref 0.0–0.2)

## 2019-11-06 LAB — BASIC METABOLIC PANEL
Anion gap: 8 (ref 5–15)
BUN: 21 mg/dL (ref 8–23)
CO2: 28 mmol/L (ref 22–32)
Calcium: 8.7 mg/dL — ABNORMAL LOW (ref 8.9–10.3)
Chloride: 106 mmol/L (ref 98–111)
Creatinine, Ser: 1.13 mg/dL — ABNORMAL HIGH (ref 0.44–1.00)
GFR calc Af Amer: 49 mL/min — ABNORMAL LOW (ref >60)
GFR calc non Af Amer: 42 mL/min — ABNORMAL LOW (ref >60)
Glucose, Bld: 188 mg/dL — ABNORMAL HIGH (ref 70–99)
Potassium: 4.2 mmol/L (ref 3.5–5.1)
Sodium: 142 mmol/L (ref 135–145)

## 2019-11-06 LAB — URINALYSIS, COMPLETE (UACMP) WITH MICROSCOPIC
Bilirubin Urine: NEGATIVE
Glucose, UA: NEGATIVE mg/dL
Hgb urine dipstick: NEGATIVE
Ketones, ur: NEGATIVE mg/dL
Leukocytes,Ua: NEGATIVE
Nitrite: NEGATIVE
Protein, ur: NEGATIVE mg/dL
Specific Gravity, Urine: 1.012 (ref 1.005–1.030)
pH: 5 (ref 5.0–8.0)

## 2019-11-06 LAB — ECHOCARDIOGRAM COMPLETE
AR max vel: 0.92 cm2
AV Area VTI: 0.96 cm2
AV Area mean vel: 0.91 cm2
AV Mean grad: 8.3 mmHg
AV Peak grad: 15.3 mmHg
Ao pk vel: 1.95 m/s
Area-P 1/2: 5.66 cm2
Height: 63 in
S' Lateral: 2.4 cm
Weight: 2645.52 oz

## 2019-11-06 LAB — HEMOGLOBIN A1C
Hgb A1c MFr Bld: 7.3 % — ABNORMAL HIGH (ref 4.8–5.6)
Mean Plasma Glucose: 162.81 mg/dL

## 2019-11-06 LAB — GLUCOSE, CAPILLARY
Glucose-Capillary: 148 mg/dL — ABNORMAL HIGH (ref 70–99)
Glucose-Capillary: 148 mg/dL — ABNORMAL HIGH (ref 70–99)
Glucose-Capillary: 109 mg/dL — ABNORMAL HIGH (ref 70–99)
Glucose-Capillary: 175 mg/dL — ABNORMAL HIGH (ref 70–99)

## 2019-11-06 MED ORDER — MELATONIN 5 MG PO TABS
5.0000 mg | ORAL_TABLET | Freq: Every day | ORAL | Status: DC
  Administered 2019-11-06: 5 mg via ORAL
  Filled 2019-11-06 (×2): qty 1

## 2019-11-06 MED ORDER — CEFDINIR 300 MG PO CAPS
300.0000 mg | ORAL_CAPSULE | Freq: Two times a day (BID) | ORAL | Status: DC
  Administered 2019-11-06 – 2019-11-07 (×3): 300 mg via ORAL
  Filled 2019-11-06 (×4): qty 1

## 2019-11-06 MED ORDER — FUROSEMIDE 10 MG/ML IJ SOLN: 40.0000 mg | Freq: Every day | INTRAMUSCULAR | Status: DC

## 2019-11-06 NOTE — ED Notes (Signed)
assisted pt with bed pan, pt wanted to have bm but only had urine

## 2019-11-06 NOTE — Progress Notes (Signed)
[name] NAME: [name]    MR#:  [id number]  DATE OF BIRTH:  12-17-1924  SUBJECTIVE:   Patient came in with increasing confusion decreased appetite and some cough. No fever. Patient's daughter is in the room patient unable to give much history review of systems although she says she feels better and is going to eat today. She is eager to go home.  REVIEW OF SYSTEMS:   Review of Systems  Unable to perform ROS: Dementia   Tolerating Diet: yes Tolerating PT: recommends rehab however family wants to take her home  DRUG ALLERGIES:   Allergies  Allergen Reactions   Metformin Hcl Other (See Comments)    GI upset   Metformin Other (See Comments), Diarrhea and Nausea And Vomiting    Other reaction(s): Other (See Comments), Unknown   Metformin And Related Diarrhea    VITALS:  Blood pressure 128/69, pulse 77, temperature 98.4 F (36.9 C), temperature source Oral, resp. rate (!) 22, height $RemoveBe'5\' 3"'poIPNbedc$  (1.6 m), weight 75 kg, SpO2 93 %.  PHYSICAL EXAMINATION:   Physical Exam limited  GENERAL:  86 y.o.-year-old patient lying in the bed with no acute distress. Obese EYES: Pupils equal, round, reactive to light and accommodation. No scleral icterus.   HEENT: Head atraumatic, normocephalic. Oropharynx and nasopharynx clear.  LUNGS: Normal breath sounds bilaterally, no wheezing, rales, rhonchi. No use of accessory muscles of respiration.  CARDIOVASCULAR: S1, S2 normal. No murmurs, rubs, or gallops.  ABDOMEN: Soft, nontender, nondistended. Bowel sounds present. No organomegaly or mass.  EXTREMITIES: No cyanosis, clubbing or edema b/l.    NEUROLOGIC: generalized weakness however grossly nonfocal PSYCHIATRIC:  patient is alert and awake SKIN: No obvious rash, lesion, or ulcer.  Per RN LABORATORY PANEL:  CBC Recent Labs  Lab 11/05/19 2351  WBC 9.0  HGB 13.8  HCT 41.5  PLT 153    Chemistries  Recent Labs  Lab  11/05/19 1425 11/05/19 2351 11/06/19 0411  NA 141  --  142  K 4.3  --  4.2  CL 104  --  106  CO2 27  --  28  GLUCOSE 221*  --  188*  BUN 23  --  21  CREATININE 1.36*   < > 1.13*  CALCIUM 9.0  --  8.7*  AST 25  --   --   ALT 13  --   --   ALKPHOS 50  --   --   BILITOT 0.9  --   --    < > = values in this interval not displayed.   Cardiac Enzymes No results for input(s): TROPONINI in the last 168 hours. RADIOLOGY:  CT Head Wo Contrast  Result Date: 11/05/2019 CLINICAL DATA:  Altered level of consciousness, failure to thrive, un witnessed fall 1 week ago EXAM: CT HEAD WITHOUT CONTRAST TECHNIQUE: Contiguous axial images were obtained from the base of the skull through the vertex without intravenous contrast. COMPARISON:  07/16/2015 FINDINGS: Brain: No acute infarct or hemorrhage. Chronic lacunar infarct right thalamus. Lateral ventricles and remaining midline structures are unremarkable. No acute extra-axial fluid collections. No mass effect. Vascular: No hyperdense vessel or unexpected calcification. Skull: Normal. Negative for fracture or focal lesion. Sinuses/Orbits: No acute finding. Other: None. IMPRESSION: 1. Stable head CT, no acute process. Electronically Signed   By: Randa Ngo M.D.   On: 11/05/2019 16:34   CT CHEST WO CONTRAST  Result Date: 11/05/2019 CLINICAL DATA:  86 year old  with respiratory failure and failure to thrive. Hypoxia. EXAM: CT CHEST WITHOUT CONTRAST TECHNIQUE: Multidetector CT imaging of the chest was performed following the standard protocol without IV contrast. COMPARISON:  Chest radiograph earlier this day.  Chest CT 11/03/2016 FINDINGS: Cardiovascular: Chronic cardiomegaly. Aortic valve replacement. Mild dilatation of the ascending aorta at 4.1 cm. Stable prominent distal transverse aortic dimension at 3.5 cm. Descending aorta is tortuous. Mild aortic atherosclerosis. No periaortic stranding. Dilated main pulmonary artery at 3.9 cm. Coronary artery  calcifications. No pericardial effusion. There mitral annulus calcifications. Mediastinum/Nodes: Scattered small calcified noncalcified mediastinal lymph nodes not enlarged by size criteria. Limited assessment for hilar adenopathy in the absence of IV contrast as well as patient motion artifact. No esophageal wall thickening. Question of 12 mm laryngeal nodule, series 2, image 13, just to the right of midline. Heterogeneously enlarged thyroid gland with scattered calcifications. In the setting of significant comorbidities or limited life expectancy, no follow-up recommended (ref: J Am Coll Radiol. 2015 Feb;12(2): 143-50). Lungs/Pleura: Heterogeneous pulmonary parenchyma with areas of slight ground-glass opacity primarily in the perihilar lungs. There is peribronchial thickening. Streaky opacities in both lower lobes. No significant pleural fluid. No definite septal thickening. Occasional pulmonary nodules, largest in the right mid lung measures 5 mm. These appear stable from prior exam, however partially obscured currently due to breathing motion artifact. There is no dominant pulmonary mass. Upper Abdomen: No acute findings in the included upper abdomen. Musculoskeletal: Scoliosis and multilevel degenerative change in the spine. Probable vertebral body hemangioma within T11. IMPRESSION: 1. Heterogeneous pulmonary parenchyma with areas of slight ground-glass opacity primarily in the perihilar lungs. This may be secondary to pulmonary edema or small airways disease. 2. Streaky opacities in both lower lobes favor atelectasis. Pneumonia felt less likely. 3. Dilated main pulmonary artery suggesting pulmonary arterial hypertension. 4. Question of 12 mm laryngeal nodule, just to the right of midline. This could be further assessed with neck CT (preferably with IV contrast). 5. Chronic cardiomegaly with prior aortic valve replacement. Ascending aortic aneurysm currently measuring 4.1 cm, previously 3.8 cm. For an aneurysm  of this size, recommend annual imaging followup by CTA or MRA, giving consideration for patient's advanced age. Aortic aneurysm NOS (ICD10-I71.9) Aortic Atherosclerosis (ICD10-I70.0). Electronically Signed   By: Keith Rake M.D.   On: 11/05/2019 21:44   US Venous Img Lower Bilateral (DVT)  Result Date: 11/05/2019 CLINICAL DATA:  Dyspnea, history of DVT EXAM: BILATERAL LOWER EXTREMITY VENOUS DOPPLER ULTRASOUND TECHNIQUE: Gray-scale sonography with compression, as well as color and duplex ultrasound, were performed to evaluate the deep venous system(s) from the level of the common femoral vein through the popliteal and proximal calf veins. COMPARISON:  05/21/2018 FINDINGS: VENOUS Normal compressibility of the common femoral, superficial femoral, and popliteal veins, as well as the visualized calf veins. Visualized portions of profunda femoral vein and great saphenous vein unremarkable. No filling defects to suggest DVT on grayscale or color Doppler imaging. Doppler waveforms show normal direction of venous flow, normal respiratory plasticity and response to augmentation. OTHER None. Limitations: none IMPRESSION: 1. No evidence of deep venous thrombosis within either lower extremity. Electronically Signed   By: Randa Ngo M.D.   On: 11/05/2019 22:51   DG Chest Portable 1 View  Result Date: 11/05/2019 CLINICAL DATA:  Hypoxia. EXAM: PORTABLE CHEST 1 VIEW COMPARISON:  May 21, 2018. FINDINGS: Stable cardiomegaly. No pneumothorax or pleural effusion is noted. Bilateral patchy airspace opacities are noted most consistent with pneumonia or possibly edema. Bony thorax is unremarkable.  IMPRESSION: Bilateral patchy airspace opacities are noted most consistent with pneumonia or possibly edema. Aortic Atherosclerosis (ICD10-I70.0). Electronically Signed   By: Marijo Conception M.D.   On: 11/05/2019 15:48   ECHOCARDIOGRAM COMPLETE  Result Date: 11/06/2019    ECHOCARDIOGRAM REPORT   Patient Name:   NAVPREET SZCZYGIEL [facility]  [place] Date of Exam: 11/06/2019 Medical Rec #:  [id number]                Height:       63.0 in Accession #:    [id number]               Weight:       165.3 lb Date of Birth:  [birth year]                 BSA:          1.783 m Patient Age:    45 years                 BP:           156/88 mmHg Patient Gender: F                        HR:           80 bpm. Exam Location:  [facility] Procedure: 2D Echo, Color Doppler and Cardiac Doppler Indications:     CHF 428.0  History:         Patient has prior history of Echocardiogram examinations, most                  recent 03/09/2018. CHF; Risk Factors:Dyslipidemia. Severe aortic                  stenosis----history states patient had Aortic valve                  replacement.  Sonographer:     Sherrie Sport RDCS (AE) Referring Phys:  [id number] Eben Burow Diagnosing Phys: Kate Sable MD  Sonographer Comments: Suboptimal apical window. Image acquisition challenging due to uncooperative patient. IMPRESSIONS  1. Left ventricular ejection fraction, by estimation, is 50 to 55%. The left ventricle has low normal function. The left ventricle has no regional wall motion abnormalities. There is mild left ventricular hypertrophy. Left ventricular diastolic function  could not be evaluated.  2. Tricuspid valve pressure gradient = 78mmHg. Right ventricular systolic function is normal. The right ventricular size is moderately enlarged. There is severely elevated pulmonary artery systolic pressure.  3. Left atrial size was mildly dilated.  4. The mitral valve is degenerative. Trivial mitral valve regurgitation.  5. Tricuspid valve regurgitation is moderate.  6. The aortic valve has been repaired/replaced. Aortic valve regurgitation is not visualized. FINDINGS  Left Ventricle: Left ventricular ejection fraction, by estimation, is 50 to 55%. The left ventricle has low normal function. The left ventricle has no regional wall motion abnormalities. The left ventricular internal cavity size was  normal in size. There is mild left ventricular hypertrophy. Left ventricular diastolic function could not be evaluated. Right Ventricle: Tricuspid valve pressure gradient = 90mmHg. The right ventricular size is moderately enlarged. Right vetricular wall thickness was not well visualized. Right ventricular systolic function is normal. There is severely elevated pulmonary artery systolic pressure. Left Atrium: Left atrial size was mildly dilated. Right Atrium: Right atrial size was normal in size. Pericardium: There is no evidence of pericardial effusion. Mitral Valve: The mitral valve is degenerative  in appearance. Trivial mitral valve regurgitation. Tricuspid Valve: The tricuspid valve is normal in structure. Tricuspid valve regurgitation is moderate. Aortic Valve: The aortic valve has been repaired/replaced. Aortic valve regurgitation is not visualized. Aortic valve mean gradient measures 8.3 mmHg. Aortic valve peak gradient measures 15.3 mmHg. Aortic valve area, by VTI measures 0.96 cm. There is a bioprosthetic valve present in the aortic position. Pulmonic Valve: The pulmonic valve was not well visualized. Pulmonic valve regurgitation is not visualized. Aorta: The aortic root is normal in size and structure. Venous: The inferior vena cava was not well visualized. IAS/Shunts: No atrial level shunt detected by color flow Doppler.  LEFT VENTRICLE PLAX 2D LVIDd:         3.22 cm LVIDs:         2.40 cm LV PW:         1.43 cm LV IVS:        1.44 cm LVOT diam:     2.00 cm LV SV:         32 LV SV Index:   18 LVOT Area:     3.14 cm  RIGHT VENTRICLE RV Basal diam:  4.70 cm RV S prime:     7.51 cm/s TAPSE (M-mode): 3.2 cm LEFT ATRIUM             Index       RIGHT ATRIUM           Index LA diam:        3.90 cm 2.19 cm/m  RA Area:     15.60 cm LA Vol (A2C):   63.5 ml 35.61 ml/m RA Volume:   36.20 ml  20.30 ml/m LA Vol (A4C):   51.8 ml 29.05 ml/m LA Biplane Vol: 57.3 ml 32.13 ml/m  AORTIC VALVE                     PULMONIC VALVE AV Area (Vmax):    0.92 cm     RVOT Peak grad: 3 mmHg AV Area (Vmean):   0.91 cm AV Area (VTI):     0.96 cm AV Vmax:           195.33 cm/s AV Vmean:          132.667 cm/s AV VTI:            0.333 m AV Peak Grad:      15.3 mmHg AV Mean Grad:      8.3 mmHg LVOT Vmax:         56.90 cm/s LVOT Vmean:        38.400 cm/s LVOT VTI:          0.102 m LVOT/AV VTI ratio: 0.31  AORTA Ao Root diam: 2.80 cm MITRAL VALVE                TRICUSPID VALVE MV Area (PHT): 5.66 cm     TR Peak grad:   63.7 mmHg MV Decel Time: 134 msec     TR Vmax:        399.00 cm/s MV E velocity: 178.00 cm/s                             SHUNTS                             Systemic VTI:  0.10 m  Systemic Diam: 2.00 cm Kate Sable MD Electronically signed by Kate Sable MD Signature Date/Time: 11/06/2019/1:33:01 PM    Final    ASSESSMENT AND PLAN:  Kaidyn Hernandes is a 86 y.o. female with medical history significant for DMT2, HTN, diastolic CHF, s/p aortic valve replacement, atherosclerosis, CKD 4, Gout, glaucoma, depression/anxiety who lives with her daughter and son in law. Presents for evaluation of increased confusion, decreased appetite and cough.  Daughter reports that for the last 4 days patient has had decreased activity level and has been more confused over her baseline level.  Acute on chronic diastolic CHF (congestive heart failure) (HCC)--mild -patient sats are more than 92% on room air -she received Lasix 40 mg IV -clinically does not appear to be in full blown heart failure. -I will change her to oral Lasix home dose from tomorrow. -Obtain echocardiogram in the morning -Patient be continued on metoprolol.   -We will add ARB therapy with losartan.  -Continue home dose of spironolactone -Doppler ultrasound of legs negative for DVT  CAP (community acquired pneumonia) -Patient replaced on Rocephin and doxycycline empirically for pneumonia that is present on chest  x-ray--change to po cefdinri -  CT scan . Heterogeneous pulmonary parenchyma with areas of slight ground-glass opacity primarily in the perihilar lungs. This may be secondary to pulmonary edema or small airways disease. 2. Streaky opacities in both lower lobes favor atelectasis. Pneumonia felt less likely. 3. Dilated main pulmonary artery suggesting pulmonary arterial hypertension. 4. Question of 12 mm laryngeal nodule, just to the right of midline. This could be further assessed with neck CT (preferably with IV contrast). 5. Chronic cardiomegaly with prior aortic valve replacement. Ascending aortic aneurysm currently measuring 4.1 cm, White blood cell count is mild elevated at 12,000.  No signs of sepsis at this time    Hypertension Continue home dose of metoprolol.  Losartan added to patient regimen.     CKD (chronic kidney disease) stage 4, GFR 15-29 ml/min (HCC) Stable chronic kidney disease.    Generalized weakness  -Patient does have a palliative care nurse at home already. -In by PT recommends rehab however patient's family wants to take her home. -TOC for discharge planning -palliative care will follow-up as outpatient-- per routine    Diabetes mellitus type 2 in nonobese ([place]) -Continue basal insulin.  Monitor blood sugars with meals and at bedtime.  -Sliding scale insulin provided as needed for glycemic control. - hemoglobin A1c 7.3   Leukocytosis Mild leukocytosis with white blood cell count 12,000-- down to 10 K     history of   S/P aortic valve replacement    Spoke with patient's daughter in the room. Overall she is improved. They met with authorial care/hospice liaison. Family wants to take patient home. Hospital bed ordered. Outpatient palliative care will be continued.  Procedures: none Family communication : daughter in the room Consults : none CODE STATUS: full DVT Prophylaxis : heparin  Status is: Inpatient  Remains inpatient appropriate  because:Inpatient level of care appropriate due to severity of illness   Dispo: The patient is from: Home              Anticipated d/c is to: Home              Anticipated d/c date is: 1 day              Patient currently is not medically stable to d/c. patient being treated for mild congestive heart failure and community acquired pneumonia  TOTAL TIME TAKING CARE OF THIS PATIENT: 25 minutes.  >50% time spent on counselling and coordination of care  Note: This dictation was prepared with Dragon dictation along with smaller phrase technology. Any transcriptional errors that result from this process are unintentional.  Fritzi Mandes M.D    [facility]   CC: Primary care physician; McLean-Scocuzza, Nino Glow, MDPatient ID: [name], female   DOB: 1924/10/19, 86 y.o.   MRN: [id number]

## 2019-11-06 NOTE — Evaluation (Signed)
Clinical/Bedside Swallow Evaluation Patient Details  Name: Ludmilla Mcgillis MRN: [id number] Date of Birth: 1924-06-18  Today's Date: 11/06/2019 Time: SLP Start Time (ACUTE ONLY): 0901 SLP Stop Time (ACUTE ONLY): 0913 SLP Time Calculation (min) (ACUTE ONLY): 12 min  Past Medical History:  Past Medical History:  Diagnosis Date  . Anemia   . Anxiety   . Carotid arterial disease ([place])    a. 08/2014 U/S: Bilateral < 50% stenosis. Patent vertebrals w/ antegrade flow.   . Cervical spine arthritis   . Chronic diastolic CHF (congestive heart failure) ([place])    a. echo 2014: EF 55-60%, DD;  b. 08/2014 Echo: EF 55-60%, no RWMA, GR1DD; c. 02/2016 Echo: EF 65-70%, Gr1 DD.  Marland Kitchen Chronic kidney disease   . Cognitive impairment   . Coronary artery disease   . Depression   . Diabetes mellitus without complication ([place])   . Dysrhythmia   . Essential hypertension   . GERD (gastroesophageal reflux disease)   . Glaucoma   . Gout   . Heart murmur   . History of renal impairment   . Hyperlipidemia   . Hypotension    a. Related to Norvasc - 11/2014 ED visit.  . Multinodular goiter    a. Noted incidentally on CT 07/2012 and carotid U/S 08/2014;  b. Nl TSH 11/2014.  Marland Kitchen Paroxysmal SVT (supraventricular tachycardia) (HCC)    a. on Toprol   . Prolapsed uterus   . Severe aortic stenosis    a. 11/15/16: s/p TAVR Edwards Sapien 3 THV (size 23 mm, model # 9600TFX, serial # [id number])  . Stroke [facility])    a. CT head 07/2014 showed small thalamic infarct  . Vitamin deficiency    Past Surgical History:  Past Surgical History:  Procedure Laterality Date  . EYE SURGERY     unsure of what procedure, did know that laser was used  . IR RADIOLOGY PERIPHERAL GUIDED IV START  11/03/2016  . IR US GUIDE VASC ACCESS RIGHT  11/03/2016  . RIGHT/LEFT HEART CATH AND CORONARY ANGIOGRAPHY N/A 10/26/2016   Procedure: RIGHT/LEFT HEART CATH AND CORONARY ANGIOGRAPHY;  Surgeon: [name], MD;  Location: [place] CV LAB;   Service: Cardiovascular;  Laterality: N/A;  . TEE WITHOUT CARDIOVERSION N/A 03/31/2016   Procedure: TRANSESOPHAGEAL ECHOCARDIOGRAM (TEE);  Surgeon: Minna Merritts, MD;  Location: [facility] ORS;  Service: Cardiovascular;  Laterality: N/A;  . TEE WITHOUT CARDIOVERSION N/A 11/15/2016   Procedure: TRANSESOPHAGEAL ECHOCARDIOGRAM (TEE);  Surgeon: [name], MD;  Location: [place];  Service: Open Heart Surgery;  Laterality: N/A;  . TRANSCATHETER AORTIC VALVE REPLACEMENT, TRANSFEMORAL N/A 11/15/2016   Procedure: TRANSCATHETER AORTIC VALVE REPLACEMENT, TRANSFEMORAL;  Surgeon: [name], MD;  Location: [place];  Service: Open Heart Surgery;  Laterality: N/A;  . TUBAL LIGATION     HPI:   Cynthis M [name] is a 86 y.o. female with medical history significant for DMT2, HTN, diastolic CHF, s/p aortic valve replacement, atherosclerosis, CKD 4, Gout, glaucoma, depression/anxiety who lives with her daughter and son in [place]. Presents for evaluation of increased confusion, decreased appetite and cough. Per Palliative Care note on 11/01/2019 pt has history of decreased PO intake and delirium. Per report, pt has halluciations that cause pt anxiety, keep her awake at night and she had an unassisted fall when attempting to get up and act on the halluciation.    Assessment / Plan / Recommendation Clinical Impression  Pt presents with altered mental status but was easily redirected during this evaluation.  Pt able to follow very basic intuitive instructions. Pt demonstrates moderate oral phase dysphagia that is c/b decreased lingual manipulation of solids (soft and regular) and impaired mastication. This results in decreased bolus management and prolonged mastication that resulted in SLP removing bolus. Pt's oral phase is exacerbated by cognitive deficits (attempting to talk while chewing) and lack of dentition (few bottom teeth in very poor condition). Additionally, pt had white coating on her tongue - sent secure chat  to pt's MD regarding possibility of thrush. Pt's pharyngeal phase appears grossly functional as her swallow appeared swift and she didn't demonstrate any overt s/s of aspiration when consuming thins via straw. At this time, recommended downgrading pt's diet to dysphagia 1 with thin liquids. Given pt's signification AMS, recommend attempting medicine crushed in puree but is pt doesn't accept in this form, nurse as liberty to present whole in puree. Given that chest CT doesn't suspect pneumonia, ST will sign off at this time.  SLP Visit Diagnosis: Dysphagia, oral phase (R13.11)    Aspiration Risk  Mild aspiration risk    Diet Recommendation Dysphagia 1 (Puree);Thin liquid   Liquid Administration via: Straw Medication Administration: Whole meds with puree (may crush if needed) Supervision: Staff to assist with self feeding;Full supervision/cueing for compensatory strategies Compensations: Minimize environmental distractions;Slow rate;Small sips/bites Postural Changes: Seated upright at 90 degrees    Other  Recommendations Oral Care Recommendations: Oral care BID;Oral care QID   Follow up Recommendations None      Frequency and Duration            Prognosis Prognosis for Safe Diet Advancement:  (don't expect progression d/t chronic appearance of her oral ) Barriers to Reach Goals: Cognitive deficits;Behavior      Swallow Study   General Date of Onset: 11/05/19 HPI:  NGOZI ALVIDREZ [name] is a 86 y.o. female with medical history significant for DMT2, HTN, diastolic CHF, s/p aortic valve replacement, atherosclerosis, CKD 4, Gout, glaucoma, depression/anxiety who lives with her daughter and son in [place]. Presents for evaluation of increased confusion, decreased appetite and cough. Per Palliative Care note on 11/01/2019 pt has history of decreased PO intake and delirium. Per report, pt has halluciations that cause pt anxiety, keep her awake at night and she had an unassisted fall when  attempting to get up and act on the halluciation.  Type of Study: Bedside Swallow Evaluation Previous Swallow Assessment: none in chart Diet Prior to this Study: Regular;Thin liquids Temperature Spikes Noted: No Respiratory Status: Room air History of Recent Intubation: No Behavior/Cognition: Alert;Confused Oral Cavity Assessment:  (white coating on entire tongue) Oral Care Completed by SLP: Yes Oral Cavity - Dentition: Edentulous (has ~ 3 bottom teeth that are in poor condition) Vision: Functional for self-feeding Self-Feeding Abilities: Able to feed self;Needs assist Patient Positioning: Upright in bed Baseline Vocal Quality: Normal Volitional Cough: Cognitively unable to elicit Volitional Swallow: Unable to elicit    Oral/Motor/Sensory Function Overall Oral Motor/Sensory Function:  (grossly within functional limits, no focal deficits)   Ice Chips Ice chips: Not tested   Thin Liquid Thin Liquid: Within functional limits Presentation: Self Fed;Straw    Nectar Thick Nectar Thick Liquid: Not tested   Honey Thick Honey Thick Liquid: Not tested   Puree Puree: Within functional limits Presentation: Spoon   Solid     Solid: Impaired Oral Phase Impairments: Reduced lingual movement/coordination;Poor awareness of bolus;Impaired mastication Oral Phase Functional Implications: Prolonged oral transit;Impaired mastication;Oral holding;Oral residue     Josi Roediger B. Rutherford Nail, M.S.,  CCC-SLP, [place] Office [phone]  Konstantinos Cordoba Rutherford Nail 11/06/2019,10:12 AM

## 2019-11-06 NOTE — ED Notes (Signed)
purewick failed, unmeausred void. Pt cleansed, bed changed, purewick adjusted.

## 2019-11-06 NOTE — ED Notes (Addendum)
Pt not safe for PO medications, choked on water, informed hospitalist  Also requested IV beta blocker

## 2019-11-06 NOTE — TOC Initial Note (Signed)
Transition of Care [facility]) - Initial/Assessment Note    Patient Details  Name: [name] MRN: [id number] Date of Birth: [birth year]  Transition of Care [facility]) CM/SW Contact:    Anselm Pancoast, RN Phone Number: 11/06/2019, 4:00 PM  Clinical Narrative:                 Spoke to patient and daughter at bedside. Daughter had her sister on speaker phone in conversation as well. Family is requesting to take patient home with [facility] as they currently provide 24/7 care for patient. Family states they have all needed equipment except for hospital bed which they have spoken to [facility] about obtaining. Family with no current needs or concerns for TOC.   Expected Discharge Plan: [place] Barriers to Discharge: Continued Medical Work up   Patient Goals and CMS Choice Patient states their goals for this hospitalization and ongoing recovery are:: return home with family      Expected Discharge Plan and Services Expected Discharge Plan: [place] Acute Care Choice: Hospice Living arrangements for the past 2 months: Single Family Home                                      Prior Living Arrangements/Services Living arrangements for the past 2 months: Single Family Home Lives with:: Self, Adult Children Patient language and need for interpreter reviewed:: Yes Do you feel safe going back to the place where you live?: Yes      Need for Family Participation in Patient Care: Yes (Comment) Care giver support system in place?: Yes (comment) Current home services: DME Criminal Activity/Legal Involvement Pertinent to Current Situation/Hospitalization: No - Comment as needed  Activities of Daily Living      Permission Sought/Granted Permission sought to share information with : Facility Art therapist granted to share information with : Yes, Verbal Permission Granted  Share Information with  NAME: [facility]           Emotional Assessment   Attitude/Demeanor/Rapport: Engaged Affect (typically observed): Accepting, Adaptable Orientation: : Oriented to Self, Oriented to Place, Oriented to  Time, Oriented to Situation Alcohol / Substance Use: Never Used Psych Involvement: No (comment)  Admission diagnosis:  Acute on chronic diastolic CHF (congestive heart failure) (HCC) [I50.33] Acute CHF (congestive heart failure) (HCC) [I50.9] Patient Active Problem List   Diagnosis Date Noted  . Acute CHF (congestive heart failure) ([place]) 11/06/2019  . Acute on chronic diastolic CHF (congestive heart failure) ([place]) 11/05/2019  . Leukocytosis 11/05/2019  . Altered mental status 11/05/2019  . Generalized weakness 11/05/2019  . Diabetes mellitus type 2 in nonobese ([place]) 11/05/2019  . CAP (community acquired pneumonia) 11/05/2019  . Prolonged QT interval 11/05/2019  . S/P aortic valve replacement 11/05/2019  . Hypertension associated with diabetes ([place]) 08/28/2019  . Physical deconditioning 08/28/2019  . Weakness of left lower extremity 08/28/2019  . Spinal stenosis of lumbar region 08/28/2019  . Benign hypertensive kidney disease with chronic kidney disease 04/29/2019  . Anxiety and depression 03/13/2019  . Pain due to onychomycosis of toenails of both feet 09/03/2018  . Diabetic neuropathy ([place]) 09/03/2018  . Chronic venous insufficiency 08/23/2018  . Osteoarthritis of left knee 08/14/2018  . Diabetes ([place]) 04/16/2018  . Lymphedema 04/16/2018  . Stage 3b chronic kidney disease 04/15/2018  . Abdominal aortic atherosclerosis ([place]) 04/15/2018  .  PAD (peripheral artery disease) ([place]) 04/15/2018  . CKD (chronic kidney disease) stage 4, GFR 15-29 ml/min (HCC) 04/11/2018  . Rectal prolapse 12/01/2017  . CHF (congestive heart failure) ([place]) 04/11/2017  . Other insomnia 01/02/2017  . Sedative hypnotic or anxiolytic dependence ([place]) 01/02/2017  . Severe aortic stenosis 11/15/2016  .  Cognitive impairment 09/26/2016  . Gout 09/26/2016  . Hiatal hernia with gastroesophageal reflux 09/26/2016  . Lumbar and sacral spondylarthritis 09/26/2016  . Multiple lacunar infarcts 09/26/2016  . Multiple pulmonary nodules 09/26/2016  . Obesity 09/26/2016  . Recurrent UTI 09/26/2016  . Shoulder pain, bilateral 09/26/2016  . Trigeminal neuralgia 09/26/2016  . Urinary leakage 09/26/2016  . Uterine prolapse 09/26/2016  . Vitamin D deficiency 09/26/2016  . Aortic stenosis 05/03/2016  . Osteoporosis 10/29/2015  . Lower extremity edema 07/07/2015  . Hypertension   . Hyperlipidemia   . PSVT (paroxysmal supraventricular tachycardia) ([place])   . Multiple thyroid nodules   . Carotid arterial disease ([place])   . Hydronephrosis, bilateral 08/28/2014  . Hammer toe of left foot 03/10/2014  . Hammer toe of right foot 03/10/2014  . Onychogryphosis 03/10/2014  . Orofacial dyskinesia 02/28/2013  . Chronic diastolic CHF (congestive heart failure) ([place]) 12/17/2012  . Hypomagnesemia 09/22/2012  . Cervical spine arthritis 06/29/2010   PCP:  McLean-Scocuzza, Nino Glow, MD Pharmacy:   [id number] - [place], [place] [address] Phone: [phone] Fax: [phone]     Social Determinants of Health (SDOH) Interventions    Readmission Risk Interventions No flowsheet data found.

## 2019-11-06 NOTE — Evaluation (Signed)
Physical Therapy Evaluation Patient Details Name: [name] MRN: [id number] DOB: [birth year] Today's Date: 11/06/2019   History of Present Illness  [name] is a 49yoF who [facility] c AMS, cough for 3-4 days. Family reports a fall last week. PMH: DM2, HTN, dCHF, AVR, atherosclerosis, CKD4, gout, glaucoma. Pt lives with DTR adn SIL at home. Pt admitted for A/C dCHF.  Clinical Impression  Pt admitted with above diagnosis. Pt currently with functional limitations due to the deficits listed below (see "PT Problem List"). Pt requires max+2 assist for bed mobility and transfers, but has poor tolerance to both, unable to fully rise to standing. Functional mobility assessment demonstrates increased effort/time requirements, poor tolerance, and need for physical assistance, whereas the patient performed these at a higher level of independence PTA.  Pt will benefit from skilled PT intervention to increase independence and safety with basic mobility in preparation for discharge to the venue listed below.       Follow Up Recommendations SNF    Equipment Recommendations  None recommended by PT    Recommendations for Other Services       Precautions / Restrictions Precautions Precautions: Fall Restrictions Weight Bearing Restrictions: No      Mobility  Bed Mobility Overal bed mobility: Needs Assistance Bed Mobility: Supine to Sit;Sit to Supine     Supine to sit: Total assist;+2 for physical assistance Sit to supine: Total assist;+2 for physical assistance      Transfers Overall transfer level: Needs assistance Equipment used: 1 person hand held assist Transfers: Sit to/from Stand Sit to Stand: Max assist;+2 physical assistance         General transfer comment: attempted, but pt hollers out in pain, asks to return to supine, unable to obtain full standin despite maxA  Ambulation/Gait                Stairs            Wheelchair Mobility     Modified Rankin (Stroke Patients Only)       Balance                                             Pertinent Vitals/Pain Pain Assessment:  (denies pain, by cries out and grimaces with mobility and when given physical support)    Home Living Family/patient expects to be discharged to:: Private residence Living Arrangements: Children (DTR, SIL)               Additional Comments: Pt c AMS; pt reports to AMB with walker, needs help sometimes with bathing, dressing. Other details will need to be collected from family.    Prior Function                 Hand Dominance        Extremity/Trunk Assessment   Upper Extremity Assessment Upper Extremity Assessment: Generalized weakness;LUE deficits/detail;Defer to OT evaluation LUE Deficits / Details: Lt hand apepars to have chronic moto/joint abnormality, Left thumb largely rests against first webspace throughout, unclear if contracted    Lower Extremity Assessment Lower Extremity Assessment: Generalized weakness    Cervical / Trunk Assessment Cervical / Trunk Assessment: Kyphotic  Communication      Cognition Arousal/Alertness: Awake/alert   Overall Cognitive Status: History of cognitive impairments - at baseline  General Comments      Exercises     Assessment/Plan    PT Assessment Patient needs continued PT services  PT Problem List Decreased strength;Decreased range of motion;Decreased activity tolerance;Decreased balance;Decreased mobility;Decreased cognition;Decreased knowledge of use of DME;Decreased safety awareness;Decreased knowledge of precautions;Cardiopulmonary status limiting activity       PT Treatment Interventions Gait training;Stair training;Functional mobility training;Therapeutic activities;Therapeutic exercise;Patient/family education;DME instruction    PT Goals (Current goals can be found in the Care Plan section)   Acute Rehab PT Goals PT Goal Formulation: Patient unable to participate in goal setting Time For Goal Achievement: 11/20/19 Potential to Achieve Goals: Fair    Frequency Min 2X/week   Barriers to discharge Inaccessible home environment      Co-evaluation               AM-PAC PT "6 Clicks" Mobility  Outcome Measure Help needed turning from your back to your side while in a flat bed without using bedrails?: Total Help needed moving from lying on your back to sitting on the side of a flat bed without using bedrails?: Total Help needed moving to and from a bed to a chair (including a wheelchair)?: Total Help needed standing up from a chair using your arms (e.g., wheelchair or bedside chair)?: Total Help needed to walk in hospital room?: Total Help needed climbing 3-5 steps with a railing? : Total 6 Click Score: 6    End of Session Equipment Utilized During Treatment: Gait belt Activity Tolerance: Patient limited by fatigue;Patient limited by pain Patient left: in bed;Other (comment) (ED gurney) Nurse Communication: Mobility status (wet gown, unable to stand, no desaturation, will need help with meal) PT Visit Diagnosis: Unsteadiness on feet (R26.81);Other abnormalities of gait and mobility (R26.89);Muscle weakness (generalized) (M62.81);History of falling (Z91.81);Difficulty in walking, not elsewhere classified (R26.2)    Time: 2751-7001 PT Time Calculation (min) (ACUTE ONLY): 18 min   Charges:   PT Evaluation $PT Eval Moderate Complexity: 1 Mod          9:50 AM, 11/06/19 Etta Grandchild, PT, DPT Physical Therapist - [facility]  [phone] ([place])    Rohn Fritsch C 11/06/2019, 9:49 AM

## 2019-11-06 NOTE — ED Notes (Signed)
Family at bedside helping pt with dinner

## 2019-11-06 NOTE — ED Notes (Signed)
Pt repeatedly yells in room and when this nurse checks on her she states she is simply yelling. This nurse turns light on to assist pt with viability in room in attempt to make more comfortable. Pt unhappy with this nurse at this time due to self being female, this nurse is ensuring pt of role of nurse and explaining what is happening in ED and pt care. Each time this nurse enters room pt states for woman to come in room but after being reminded self is pts nurse she redirects

## 2019-11-06 NOTE — Progress Notes (Signed)
*  PRELIMINARY RESULTS* Echocardiogram 2D Echocardiogram has been performed.  Sherrie Sport 11/06/2019, 8:28 AM

## 2019-11-06 NOTE — ED Notes (Signed)
Pericare provided for pt, barrier applied to breakdown on bottoms and on areas of non-breakdown, new sacral pad applied and pt positioned to left side

## 2019-11-06 NOTE — Progress Notes (Signed)
[place] Room ED34 [facility]) Hospital Liaison RN note:   Spoke with daughter, [name] in the room  regarding hospice versus palliative services. Daughter voiced that they would like to continue palliative and take patient home with [facility] and PT and reach out for Hospice services if they need it in the future. Hospital care team is aware.  Zandra Abts, RN [facility] Liaison [phone]

## 2019-11-06 NOTE — Progress Notes (Signed)
Pt has two areas to bottom that are reddened, one area on the left side that is open but no drainage. Pt stated she has had them for a while and they are somewhat painful. Barrier cream and protective foam applied to skin.

## 2019-11-06 NOTE — Progress Notes (Signed)
Pt tolerated meds crushed in applesauce well

## 2019-11-06 NOTE — ED Notes (Signed)
Pt yells out as soon as this nurse is entering room due to reported bad dream. Nurse at beside

## 2019-11-06 NOTE — ED Notes (Signed)
This nurse walks past pts room heading toward desk. Pt yells into hall, "I seen you, keep on walking, keep on." Will continue to monitor.

## 2019-11-07 LAB — BASIC METABOLIC PANEL
Anion gap: 11 (ref 5–15)
BUN: 21 mg/dL (ref 8–23)
CO2: 28 mmol/L (ref 22–32)
Calcium: 9.2 mg/dL (ref 8.9–10.3)
Chloride: 105 mmol/L (ref 98–111)
Creatinine, Ser: 1.02 mg/dL — ABNORMAL HIGH (ref 0.44–1.00)
GFR calc Af Amer: 55 mL/min — ABNORMAL LOW (ref >60)
GFR calc non Af Amer: 47 mL/min — ABNORMAL LOW (ref >60)
Glucose, Bld: 142 mg/dL — ABNORMAL HIGH (ref 70–99)
Potassium: 3.6 mmol/L (ref 3.5–5.1)
Sodium: 144 mmol/L (ref 135–145)

## 2019-11-07 LAB — GLUCOSE, CAPILLARY
Glucose-Capillary: 146 mg/dL — ABNORMAL HIGH (ref 70–99)
Glucose-Capillary: 156 mg/dL — ABNORMAL HIGH (ref 70–99)

## 2019-11-07 MED ORDER — LOSARTAN POTASSIUM 25 MG PO TABS: 25.0000 mg | ORAL_TABLET | Freq: Every day | ORAL | 0 refills | Status: AC

## 2019-11-07 MED ORDER — VITAMIN D3 25 MCG (1000 UNIT) PO TABS
1000.0000 [IU] | ORAL_TABLET | Freq: Every day | ORAL | Status: DC
  Administered 2019-11-07: 1000 [IU] via ORAL
  Filled 2019-11-07 (×2): qty 1

## 2019-11-07 MED ORDER — CEFDINIR 300 MG PO CAPS: 300.0000 mg | ORAL_CAPSULE | Freq: Two times a day (BID) | ORAL | 0 refills | Status: AC

## 2019-11-07 MED ORDER — PILOCARPINE HCL 4 % OP SOLN
1.0000 [drp] | Freq: Four times a day (QID) | OPHTHALMIC | Status: DC
  Administered 2019-11-07: 1 [drp] via OPHTHALMIC
  Filled 2019-11-07: qty 15

## 2019-11-07 MED ORDER — ADULT MULTIVITAMIN W/MINERALS CH
1.0000 | ORAL_TABLET | Freq: Every day | ORAL | Status: DC
  Administered 2019-11-07: 1 via ORAL
  Filled 2019-11-07: qty 1

## 2019-11-07 MED ORDER — FERROUS SULFATE 220 (44 FE) MG/5ML PO ELIX: 220.0000 mg | ORAL_SOLUTION | Freq: Every day | ORAL | Status: DC

## 2019-11-07 MED ORDER — GABAPENTIN 100 MG PO CAPS: 100.0000 mg | ORAL_CAPSULE | Freq: Every day | ORAL | Status: DC

## 2019-11-07 NOTE — Telephone Encounter (Signed)
Faxed on 11-07-19 to [facility] [place] for hospital bed with rails and gel mattress

## 2019-11-07 NOTE — Progress Notes (Signed)
OT Cancellation Note  Patient Details Name: Estella Malatesta MRN: [id number] DOB: 11/14/1924   Cancelled Treatment:    Reason Eval/Treat Not Completed: Other (comment) (Consult received, chart reviewed. Per discussion with MD, plan is for pt to return home with hospice, no need for OT consultation.)  Josiah Lobo, PhD, [place], OTR/L ascom [phone] 11/07/19, 9:17 AM

## 2019-11-07 NOTE — TOC Transition Note (Signed)
Transition of Care [facility]) - CM/SW Discharge Note   Patient Details  Name: [name] MRN: [id number] Date of Birth: [birth year]  Transition of Care [facility]) CM/SW Contact:  Victorino Dike, RN Phone Number: 11/07/2019, 10:20 AM   Clinical Narrative:     Patient discharging home today with [facility] services provided by [facility].  HH PT and RN.  [facility] patient home .  No further TOC needs at this time, please re-consult for new needs.     Final next level of care: [place] Barriers to Discharge: Barriers Resolved   Patient Goals and CMS Choice Patient states their goals for this hospitalization and ongoing recovery are:: return home with family CMS Medicare.gov Compare Post Acute Care list provided to:: Patient Represenative (must comment) Choice offered to / list presented to : Patient, Adult Children  Discharge Placement                Patient to be transferred to facility by: [facility] Name of family member notified: [name] Patient and family notified of of transfer: 11/07/19  Discharge Plan and Services   Discharge Planning Services: CM Consult Post Acute Care Choice: Hospice                      [facility] Agency: [facility] Date [facility] Agency Contacted: 11/07/19 Time HH Agency Contacted: 37 Representative spoke with at [place]: [place] Determinants of Health ([place]) Interventions     Readmission Risk Interventions No flowsheet data found.

## 2019-11-07 NOTE — Progress Notes (Signed)
Discharge instructions reviewed and explained to daughter, [name] Harris/ verbalized understanding. IV and Tele removed. Awaiting EMS to transport home.

## 2019-11-07 NOTE — Discharge Summary (Addendum)
[facility] [place] [facility] [place] NAME: [name]    MR#:  [id number]  DATE OF BIRTH:  10/02/1924  DATE OF ADMISSION:  11/05/2019 ADMITTING PHYSICIAN: Fritzi Mandes, MD  DATE OF DISCHARGE: 11/07/2019  PRIMARY CARE PHYSICIAN: McLean-Scocuzza, Nino Glow, MD    ADMISSION DIAGNOSIS:  Inanition ([place]) [R64] Dyspnea [R06.00] Failure to thrive in adult [R62.7] Acute CHF (congestive heart failure) (HCC) [I50.9] Acute on chronic diastolic CHF (congestive heart failure) ([place]) [I50.33]  DISCHARGE DIAGNOSIS:  acute on chronic diastolic heart failure community acquired pneumonia generalized weakness  SECONDARY DIAGNOSIS:   Past Medical History:  Diagnosis Date  . Anemia   . Anxiety   . Carotid arterial disease ([place])    a. 08/2014 U/S: Bilateral < 50% stenosis. Patent vertebrals w/ antegrade flow.   . Cervical spine arthritis   . Chronic diastolic CHF (congestive heart failure) ([place])    a. echo 2014: EF 55-60%, DD;  b. 08/2014 Echo: EF 55-60%, no RWMA, GR1DD; c. 02/2016 Echo: EF 65-70%, Gr1 DD.  Marland Kitchen Chronic kidney disease   . Cognitive impairment   . Coronary artery disease   . Depression   . Diabetes mellitus without complication ([place])   . Dysrhythmia   . Essential hypertension   . GERD (gastroesophageal reflux disease)   . Glaucoma   . Gout   . Heart murmur   . History of renal impairment   . Hyperlipidemia   . Hypotension    a. Related to Norvasc - 11/2014 ED visit.  . Multinodular goiter    a. Noted incidentally on CT 07/2012 and carotid U/S 08/2014;  b. Nl TSH 11/2014.  Marland Kitchen Paroxysmal SVT (supraventricular tachycardia) (HCC)    a. on Toprol   . Prolapsed uterus   . Severe aortic stenosis    a. 11/15/16: s/p TAVR Edwards Sapien 3 THV (size 23 mm, model # 9600TFX, serial # [id number])  . Stroke [facility])    a. CT head 07/2014 showed small thalamic infarct  . Vitamin deficiency     HOSPITAL COURSE:   Cassandra BREON Simmonsis a 86 y.o.femalewith  medical history significant forDMT2, HTN, diastolic CHF, s/p aortic valve replacement, atherosclerosis, CKD 4, Gout, glaucoma, depression/anxiety who lives with her daughter and [place]. Presents forevaluation of increased confusion, decreased appetite and cough. Daughter reports that for the last 4 days patient has had decreased activity level and has been more confused over her baseline level.  Acute on chronic diastolic CHF (congestive heart failure) (HCC)--mild -patient sats are more than 92% on room air -she received Lasix 40 mg IV--change to home dose Torsemide -f/u Dt gollan as out pt -clinically improved. -Echo results 1. Left ventricular ejection fraction, by estimation, is 50 to 55%. The  left ventricle has low normal function. The left ventricle has no regional  wall motion abnormalities. There is mild left ventricular hypertrophy.  Left ventricular diastolic function  could not be evaluated.  2. Tricuspid valve pressure gradient = 81mHg. Right ventricular systolic  function is normal. The right ventricular size is moderately enlarged.  There is severely elevated pulmonary artery systolic pressure.  3. Left atrial size was mildly dilated.  4. The mitral valve is degenerative. Trivial mitral valve regurgitation.  5. Tricuspid valve regurgitation is moderate.  6. The aortic valve has been repaired/replaced. Aortic valve  regurgitation is not visualized.  -Patient be continued on metoprolol.  -We will add ARB therapy with small dose of losartan. -Continue home dose of  spironolactone -Doppler ultrasound of legs negative for DVT -sats 100% on RA. No leg edema  CAP (community acquired pneumonia) -Patient replaced on Rocephin and doxycycline empirically for pneumonia that is present on chest x-ray--change to po cefdinir - CT scan chest showed Heterogeneous pulmonary parenchyma with areas of slight ground-glass opacity primarily in the perihilar lungs. This may  be secondary to pulmonary edema or small airways disease. 2. Streaky opacities in both lower lobes favor atelectasis. Pneumonia felt less likely. 3. Dilated main pulmonary artery suggesting pulmonary arterial hypertension. 4. Question of 12 mm laryngeal nodule, just to the right of midline. This could be further assessed with neck CT (preferably with IV contrast). 5. Chronic cardiomegaly with prior aortic valve replacement. Ascending aortic aneurysm currently measuring 4.1 cm, White blood cell count is mild elevated at 12,000. No signs of sepsis at this time  Hypertension Continue home dose of metoprolol. Losartan added to patient regimen.   CKD (chronic kidney disease) stage 3, GFR 42-47 ml/min (HCC) Stable chronic kidney disease.  Generalized weakness  -Patient does have a palliative care follw at home already. -TOC for discharge planning--HHPT and palliative care and transition to Hospice when needed -palliative care will follow-up as outpatient-- per routine  Diabetes mellitus type 2 in nonobese ([place]) -Continue basal insulin. Monitor blood sugars with meals and at bedtime. -Sliding scale insulin provided as needed for glycemic control. - hemoglobin A1c 7.3 -pt will resume her home medication regimen. D/ced Sitagliptin  Leukocytosis Mild leukocytosis with white blood cell count 12,000-- down to 10 K   history of S/P aortic valve replacement   Spoke with patient's daughter Percell Locus in the room. Overall she is improved. They met with authorial care/hospice liaison. Family wants to take patient home.  Outpatient palliative care will be continued.  Procedures: none Family communication : daughter in the room Consults : none CODE STATUS: full DVT Prophylaxis : heparin  CONSULTS OBTAINED:    DRUG ALLERGIES:   Allergies  Allergen Reactions  . Metformin Hcl Other (See Comments)    GI upset  . Metformin Other (See Comments), Diarrhea and  Nausea And Vomiting    Other reaction(s): Other (See Comments), Unknown  . Metformin And Related Diarrhea    DISCHARGE MEDICATIONS:   Allergies as of 11/07/2019      Reactions   Metformin Hcl Other (See Comments)   GI upset   Metformin Other (See Comments), Diarrhea, Nausea And Vomiting   Other reaction(s): Other (See Comments), Unknown   Metformin And Related Diarrhea      Medication List    STOP taking these medications   Potassium Chloride ER 20 MEQ Tbcr   sitaGLIPtin 25 MG tablet Commonly known as: Januvia     TAKE these medications   Accu-Chek Aviva Plus test strip Generic drug: glucose blood USE 1 STRIP TO TEST BLOOD SUGARS 3 TIMES A DAY What changed: Another medication with the same name was removed. Continue taking this medication, and follow the directions you see here.   acetaminophen 500 MG tablet Commonly known as: TYLENOL Take 1 tablet (500 mg total) by mouth every 6 (six) hours as needed for mild pain or headache.   allopurinol 100 MG tablet Commonly known as: ZYLOPRIM Take 1 tablet (100 mg total) by mouth daily.   ARTIFICIAL TEARS OP Apply 1 drop to eye 2 (two) times daily. Left eye   aspirin 81 MG chewable tablet Chew 1 tablet (81 mg total) by mouth daily.   blood glucose meter kit  and supplies Dispense based on patient and insurance preference. Use 1x daily as directed. (FOR ICD-10 E10.9, E11.9).   brimonidine 0.1 % Soln Commonly known as: ALPHAGAN P Place 1 drop into the right eye 2 (two) times daily.   cefdinir 300 MG capsule Commonly known as: OMNICEF Take 1 capsule (300 mg total) by mouth every 12 (twelve) hours for 5 days.   cholecalciferol 25 MCG (1000 UNIT) tablet Commonly known as: VITAMIN D Take 1 tablet (1,000 Units total) by mouth daily.   clotrimazole 1 % cream Commonly known as: LOTRIMIN APPLY TOPICALLY TO THE AFFECTED AREA ONCE A DAY AT BEDTIME FOR 7-14 DAYS   COSOPT OP Place 1 drop into the right eye 2 (two) times daily.    dimethicone 1 % cream Commonly known as: Proshield Plus Skin Protectant 1 application to the buttock 2 times daily to help protect skin   ferrous sulfate 220 (44 Fe) MG/5ML solution Take 5 mLs (220 mg total) by mouth daily with breakfast.   FreeStyle Libre 14 Day Reader Kerrin Mo 1 Device by Does not apply route every 14 (fourteen) days.   FreeStyle Libre 14 Day Sensor Misc 1 Device by Does not apply route every 14 (fourteen) days.   gabapentin 300 MG capsule Commonly known as: NEURONTIN Take 1 capsule (300 mg total) by mouth 2 (two) times daily. What changed: Another medication with the same name was changed. Make sure you understand how and when to take each.   gabapentin 100 MG capsule Commonly known as: NEURONTIN TAKE 1 CAPSULE BY MOUTH EVERY DAY 1 HOUR BEFORE BEDTIME What changed: See the new instructions.   Lancet Device Misc 1 Device by Does not apply route as directed.   latanoprost 0.005 % ophthalmic solution Commonly known as: XALATAN Place 1 drop into the right eye at bedtime.   LORazepam 1 MG tablet Commonly known as: ATIVAN Take 1 tablet (1 mg total) by mouth at bedtime as needed for anxiety.   losartan 25 MG tablet Commonly known as: COZAAR Take 1 tablet (25 mg total) by mouth daily. Start taking on: November 08, 2019   lovastatin 20 MG tablet Commonly known as: MEVACOR Take 1 tablet (20 mg total) by mouth at bedtime. Note reduced dose due to age and chronic medical issues d/c 40 mg dose   metoprolol succinate 25 MG 24 hr tablet Commonly known as: TOPROL-XL Take 1 tablet (25 mg total) by mouth daily.   metroNIDAZOLE 0.75 % vaginal gel Commonly known as: METROGEL Place 1 Applicatorful vaginally 2 (two) times daily. Prn BV   MULTI-VITAMIN PO Take 1 tablet by mouth daily.   NovoLOG FlexPen 100 UNIT/ML FlexPen Generic drug: insulin aspart TID before meals 15-30 minutes based on sugar reading before meals If sugar: 70-130 0 units, 131-180 2 units,  181-240 4 units, 241-300 6 units, 301-350 8 units, 351-400 10 units, >400 12 units and call doctor   OneTouch Delica Lancets 06C Misc USE TO TEST BLOOD SUGAR ONCE A DAY   onetouch ultrasoft lancets Bid E 11.9 Use as instructed   Ozempic (0.25 or 0.5 MG/DOSE) 2 MG/1.5ML Sopn Generic drug: Semaglutide(0.25 or 0.5MG/DOS) Inject 0.25 mg into the skin once a week.   pantoprazole 40 MG tablet Commonly known as: PROTONIX Take 1 tablet (40 mg total) by mouth daily. 30 minutes before breakfast OR dinner   Pen Needles 30G X 8 MM Misc 1 Device by Does not apply route once a week. What changed: Another medication with the same name  was removed. Continue taking this medication, and follow the directions you see here.   Pen Needles 31G X 8 MM Misc 1 pen by Does not apply route daily. What changed: Another medication with the same name was removed. Continue taking this medication, and follow the directions you see here.   pilocarpine 4 % ophthalmic solution Commonly known as: PILOCAR Place 1 drop into the right eye 4 (four) times daily.   SEROquel 25 MG tablet Generic drug: QUEtiapine Take 25 mg by mouth at bedtime.   sertraline 50 MG tablet Commonly known as: ZOLOFT Take 1 tablet (50 mg total) by mouth daily.   spironolactone 25 MG tablet Commonly known as: ALDACTONE Take 25 mg by mouth daily.   torsemide 20 MG tablet Commonly known as: DEMADEX Take 2 tablets (40 mg total) by mouth as directed. Take 40MG by mouth every day with extra 40MG after lunch for weight greater than 180 lbs What changed: when to take this   traMADol 50 MG tablet Commonly known as: ULTRAM Take 50 mg by mouth every 8 (eight) hours as needed.   traZODone 100 MG tablet Commonly known as: DESYREL Take 0.5-1 tablets (50-100 mg total) by mouth at bedtime as needed.   Tyler Aas FlexTouch 100 UNIT/ML FlexTouch Pen Generic drug: insulin degludec Inject 0.1 mLs (10 Units total) into the skin daily.             Durable Medical Equipment  (From admission, onward)         Start     Ordered   11/06/19 1545  For home use only DME Hospital bed  Once       Question Answer Comment  Length of Need 6 Months   The above medical condition requires: Patient requires the ability to reposition frequently   Bed type Semi-electric      11/06/19 1544           Discharge Care Instructions  (From admission, onward)         Start     Ordered   11/07/19 0000  Discharge wound care:       Comments: Topical application as before   11/07/19 1029          If you experience worsening of your admission symptoms, develop shortness of breath, life threatening emergency, suicidal or homicidal thoughts you must seek medical attention immediately by calling 911 or calling your MD immediately  if symptoms less severe.  You Must read complete instructions/literature along with all the possible adverse reactions/side effects for all the Medicines you take and that have been prescribed to you. Take any new Medicines after you have completely understood and accept all the possible adverse reactions/side effects.   Please note  You were cared for by a hospitalist during your hospital stay. If you have any questions about your discharge medications or the care you received while you were in the hospital after you are discharged, you can call the unit and asked to speak with the hospitalist on call if the hospitalist that took care of you is not available. Once you are discharged, your primary care physician will handle any further medical issues. Please note that NO REFILLS for any discharge medications will be authorized once you are discharged, as it is imperative that you return to your primary care physician (or establish a relationship with a primary care physician if you do not have one) for your aftercare needs so that they can reassess your need for medications  and monitor your lab values. Today   SUBJECTIVE    No new complaints  VITAL SIGNS:  Blood pressure 133/77, pulse 73, temperature 97.7 F (36.5 C), temperature source Oral, resp. rate 18, height 5' 3"  (1.6 m), weight 77.5 kg, SpO2 96 %.  I/O:    Intake/Output Summary (Last 24 hours) at 11/07/2019 1300 Last data filed at 11/07/2019 1000 Gross per 24 hour  Intake 120 ml  Output --  Net 120 ml    PHYSICAL EXAMINATION:  GENERAL:  86 y.o.-year-old patient lying in the bed with no acute distress. obese EYES: Pupils equal, round, reactive to light and accommodation. No scleral icterus.  HEENT: Head atraumatic, normocephalic. Oropharynx and nasopharynx clear.   LUNGS: decreased breath sounds bilaterally, no wheezing, rales,rhonchi or crepitation. No use of accessory muscles of respiration.  CARDIOVASCULAR: S1, S2 normal. No murmurs, rubs, or gallops.  ABDOMEN: Soft, non-tender, non-distended. Bowel sounds present. No organomegaly or mass.  EXTREMITIES: No pedal edema, cyanosis, or clubbing.  NEUROLOGIC: non focal--overall weak  PSYCHIATRIC:  patient is alert and awake SKIN: No obvious rash, lesion, or ulcer--per RN    DATA REVIEW:   CBC  Recent Labs  Lab 11/05/19 2351  WBC 9.0  HGB 13.8  HCT 41.5  PLT 153    Chemistries  Recent Labs  Lab 11/05/19 1425 11/05/19 2351 11/07/19 0433  NA 141   < > 144  K 4.3   < > 3.6  CL 104   < > 105  CO2 27   < > 28  GLUCOSE 221*   < > 142*  BUN 23   < > 21  CREATININE 1.36*   < > 1.02*  CALCIUM 9.0   < > 9.2  AST 25  --   --   ALT 13  --   --   ALKPHOS 50  --   --   BILITOT 0.9  --   --    < > = values in this interval not displayed.    Microbiology Results   Recent Results (from the past 240 hour(s))  SARS Coronavirus 2 by RT PCR (hospital order, performed in [facility] hospital lab) Nasopharyngeal Nasopharyngeal Swab     Status: None   Collection Time: 11/05/19  2:25 PM   Specimen: Nasopharyngeal Swab  Result Value Ref Range Status   SARS Coronavirus 2 NEGATIVE NEGATIVE  Final    Comment: (NOTE) SARS-CoV-2 target nucleic acids are NOT DETECTED.  The SARS-CoV-2 RNA is generally detectable in upper and lower respiratory specimens during the acute phase of infection. The lowest concentration of SARS-CoV-2 viral copies this assay can detect is 250 copies / mL. A negative result does not preclude SARS-CoV-2 infection and should not be used as the sole basis for treatment or other patient management decisions.  A negative result may occur with improper specimen collection / handling, submission of specimen other than nasopharyngeal swab, presence of viral mutation(s) within the areas targeted by this assay, and inadequate number of viral copies (<250 copies / mL). A negative result must be combined with clinical observations, patient history, and epidemiological information.  Fact Sheet for Patients:   StrictlyIdeas.no  Fact Sheet for Healthcare Providers: BankingDealers.co.za  This test is not yet approved or  cleared by the [place] FDA and has been authorized for detection and/or diagnosis of SARS-CoV-2 by FDA under an Emergency Use Authorization (EUA).  This EUA will remain in effect (meaning this test can be used) for the duration of  the COVID-19 declaration under Section 564(b)(1) of the Act, 21 U.S.C. section 360bbb-3(b)(1), unless the authorization is terminated or revoked sooner.  Performed at [facility], [address] [place], [place] [address]     RADIOLOGY:  CT Head Wo Contrast  Result Date: 11/05/2019 CLINICAL DATA:  Altered level of consciousness, failure to thrive, un witnessed fall 1 week ago EXAM: CT HEAD WITHOUT CONTRAST TECHNIQUE: Contiguous axial images were obtained from the base of the skull through the vertex without intravenous contrast. COMPARISON:  07/16/2015 FINDINGS: Brain: No acute infarct or hemorrhage. Chronic lacunar infarct right thalamus. Lateral  ventricles and remaining midline structures are unremarkable. No acute extra-axial fluid collections. No mass effect. Vascular: No hyperdense vessel or unexpected calcification. Skull: Normal. Negative for fracture or focal lesion. Sinuses/Orbits: No acute finding. Other: None. IMPRESSION: 1. Stable head CT, no acute process. Electronically Signed   By: Randa Ngo M.D.   On: 11/05/2019 16:34   CT CHEST WO CONTRAST  Result Date: 11/05/2019 CLINICAL DATA:  86 year old with respiratory failure and failure to thrive. Hypoxia. EXAM: CT CHEST WITHOUT CONTRAST TECHNIQUE: Multidetector CT imaging of the chest was performed following the standard protocol without IV contrast. COMPARISON:  Chest radiograph earlier this day.  Chest CT 11/03/2016 FINDINGS: Cardiovascular: Chronic cardiomegaly. Aortic valve replacement. Mild dilatation of the ascending aorta at 4.1 cm. Stable prominent distal transverse aortic dimension at 3.5 cm. Descending aorta is tortuous. Mild aortic atherosclerosis. No periaortic stranding. Dilated main pulmonary artery at 3.9 cm. Coronary artery calcifications. No pericardial effusion. There mitral annulus calcifications. Mediastinum/Nodes: Scattered small calcified noncalcified mediastinal lymph nodes not enlarged by size criteria. Limited assessment for hilar adenopathy in the absence of IV contrast as well as patient motion artifact. No esophageal wall thickening. Question of 12 mm laryngeal nodule, series 2, image 13, just to the right of midline. Heterogeneously enlarged thyroid gland with scattered calcifications. In the setting of significant comorbidities or limited life expectancy, no follow-up recommended (ref: J Am Coll Radiol. 2015 Feb;12(2): 143-50). Lungs/Pleura: Heterogeneous pulmonary parenchyma with areas of slight ground-glass opacity primarily in the perihilar lungs. There is peribronchial thickening. Streaky opacities in both lower lobes. No significant pleural fluid. No  definite septal thickening. Occasional pulmonary nodules, largest in the right mid lung measures 5 mm. These appear stable from prior exam, however partially obscured currently due to breathing motion artifact. There is no dominant pulmonary mass. Upper Abdomen: No acute findings in the included upper abdomen. Musculoskeletal: Scoliosis and multilevel degenerative change in the spine. Probable vertebral body hemangioma within T11. IMPRESSION: 1. Heterogeneous pulmonary parenchyma with areas of slight ground-glass opacity primarily in the perihilar lungs. This may be secondary to pulmonary edema or small airways disease. 2. Streaky opacities in both lower lobes favor atelectasis. Pneumonia felt less likely. 3. Dilated main pulmonary artery suggesting pulmonary arterial hypertension. 4. Question of 12 mm laryngeal nodule, just to the right of midline. This could be further assessed with neck CT (preferably with IV contrast). 5. Chronic cardiomegaly with prior aortic valve replacement. Ascending aortic aneurysm currently measuring 4.1 cm, previously 3.8 cm. For an aneurysm of this size, recommend annual imaging followup by CTA or MRA, giving consideration for patient's advanced age. Aortic aneurysm NOS (ICD10-I71.9) Aortic Atherosclerosis (ICD10-I70.0). Electronically Signed   By: Keith Rake M.D.   On: 11/05/2019 21:44   US Venous Img Lower Bilateral (DVT)  Result Date: 11/05/2019 CLINICAL DATA:  Dyspnea, history of DVT EXAM: BILATERAL LOWER EXTREMITY VENOUS DOPPLER ULTRASOUND TECHNIQUE: Gray-scale sonography  with compression, as well as color and duplex ultrasound, were performed to evaluate the deep venous system(s) from the level of the common femoral vein through the popliteal and proximal calf veins. COMPARISON:  05/21/2018 FINDINGS: VENOUS Normal compressibility of the common femoral, superficial femoral, and popliteal veins, as well as the visualized calf veins. Visualized portions of profunda femoral  vein and great saphenous vein unremarkable. No filling defects to suggest DVT on grayscale or color Doppler imaging. Doppler waveforms show normal direction of venous flow, normal respiratory plasticity and response to augmentation. OTHER None. Limitations: none IMPRESSION: 1. No evidence of deep venous thrombosis within either lower extremity. Electronically Signed   By: Randa Ngo M.D.   On: 11/05/2019 22:51   DG Chest Portable 1 View  Result Date: 11/05/2019 CLINICAL DATA:  Hypoxia. EXAM: PORTABLE CHEST 1 VIEW COMPARISON:  May 21, 2018. FINDINGS: Stable cardiomegaly. No pneumothorax or pleural effusion is noted. Bilateral patchy airspace opacities are noted most consistent with pneumonia or possibly edema. Bony thorax is unremarkable. IMPRESSION: Bilateral patchy airspace opacities are noted most consistent with pneumonia or possibly edema. Aortic Atherosclerosis (ICD10-I70.0). Electronically Signed   By: Marijo Conception M.D.   On: 11/05/2019 15:48   ECHOCARDIOGRAM COMPLETE  Result Date: 11/06/2019    ECHOCARDIOGRAM REPORT   Patient Name:   CLAUDELL RHODY [facility] Green Date of Exam: 11/06/2019 Medical Rec #:  [id number]                Height:       63.0 in Accession #:    [id number]               Weight:       165.3 lb Date of Birth:  1924-09-02                 BSA:          1.783 m Patient Age:    25 years                 BP:           156/88 mmHg Patient Gender: F                        HR:           80 bpm. Exam Location:  [facility] Procedure: 2D Echo, Color Doppler and Cardiac Doppler Indications:     CHF 428.0  History:         Patient has prior history of Echocardiogram examinations, most                  recent 03/09/2018. CHF; Risk Factors:Dyslipidemia. Severe aortic                  stenosis----history states patient had Aortic valve                  replacement.  Sonographer:     Sherrie Sport RDCS (AE) Referring Phys:  [id number] Eben Burow Diagnosing Phys: Kate Sable MD  Sonographer Comments:  Suboptimal apical window. Image acquisition challenging due to uncooperative patient. IMPRESSIONS  1. Left ventricular ejection fraction, by estimation, is 50 to 55%. The left ventricle has low normal function. The left ventricle has no regional wall motion abnormalities. There is mild left ventricular hypertrophy. Left ventricular diastolic function  could not be evaluated.  2. Tricuspid valve pressure gradient = 31mHg. Right ventricular systolic function is normal. The right ventricular  size is moderately enlarged. There is severely elevated pulmonary artery systolic pressure.  3. Left atrial size was mildly dilated.  4. The mitral valve is degenerative. Trivial mitral valve regurgitation.  5. Tricuspid valve regurgitation is moderate.  6. The aortic valve has been repaired/replaced. Aortic valve regurgitation is not visualized. FINDINGS  Left Ventricle: Left ventricular ejection fraction, by estimation, is 50 to 55%. The left ventricle has low normal function. The left ventricle has no regional wall motion abnormalities. The left ventricular internal cavity size was normal in size. There is mild left ventricular hypertrophy. Left ventricular diastolic function could not be evaluated. Right Ventricle: Tricuspid valve pressure gradient = 31mmHg. The right ventricular size is moderately enlarged. Right vetricular wall thickness was not well visualized. Right ventricular systolic function is normal. There is severely elevated pulmonary artery systolic pressure. Left Atrium: Left atrial size was mildly dilated. Right Atrium: Right atrial size was normal in size. Pericardium: There is no evidence of pericardial effusion. Mitral Valve: The mitral valve is degenerative in appearance. Trivial mitral valve regurgitation. Tricuspid Valve: The tricuspid valve is normal in structure. Tricuspid valve regurgitation is moderate. Aortic Valve: The aortic valve has been repaired/replaced. Aortic valve regurgitation is not  visualized. Aortic valve mean gradient measures 8.3 mmHg. Aortic valve peak gradient measures 15.3 mmHg. Aortic valve area, by VTI measures 0.96 cm. There is a bioprosthetic valve present in the aortic position. Pulmonic Valve: The pulmonic valve was not well visualized. Pulmonic valve regurgitation is not visualized. Aorta: The aortic root is normal in size and structure. Venous: The inferior vena cava was not well visualized. IAS/Shunts: No atrial level shunt detected by color flow Doppler.  LEFT VENTRICLE PLAX 2D LVIDd:         3.22 cm LVIDs:         2.40 cm LV PW:         1.43 cm LV IVS:        1.44 cm LVOT diam:     2.00 cm LV SV:         32 LV SV Index:   18 LVOT Area:     3.14 cm  RIGHT VENTRICLE RV Basal diam:  4.70 cm RV S prime:     7.51 cm/s TAPSE (M-mode): 3.2 cm LEFT ATRIUM             Index       RIGHT ATRIUM           Index LA diam:        3.90 cm 2.19 cm/m  RA Area:     15.60 cm LA Vol (A2C):   63.5 ml 35.61 ml/m RA Volume:   36.20 ml  20.30 ml/m LA Vol (A4C):   51.8 ml 29.05 ml/m LA Biplane Vol: 57.3 ml 32.13 ml/m  AORTIC VALVE                    PULMONIC VALVE AV Area (Vmax):    0.92 cm     RVOT Peak grad: 3 mmHg AV Area (Vmean):   0.91 cm AV Area (VTI):     0.96 cm AV Vmax:           195.33 cm/s AV Vmean:          132.667 cm/s AV VTI:            0.333 m AV Peak Grad:      15.3 mmHg AV Mean Grad:      8.3 mmHg  LVOT Vmax:         56.90 cm/s LVOT Vmean:        38.400 cm/s LVOT VTI:          0.102 m LVOT/AV VTI ratio: 0.31  AORTA Ao Root diam: 2.80 cm MITRAL VALVE                TRICUSPID VALVE MV Area (PHT): 5.66 cm     TR Peak grad:   63.7 mmHg MV Decel Time: 134 msec     TR Vmax:        399.00 cm/s MV E velocity: 178.00 cm/s                             SHUNTS                             Systemic VTI:  0.10 m                             Systemic Diam: 2.00 cm Kate Sable MD Electronically signed by Kate Sable MD Signature Date/Time: 11/06/2019/1:33:01 PM    Final      CODE  STATUS:     Code Status Orders  (From admission, onward)         Start     Ordered   11/05/19 2139  Full code  Continuous        11/05/19 2138        Code Status History    Date Active Date Inactive Code Status Order ID Comments User Context   11/15/2016 1243 11/17/2016 1710 Full Code [id number]  [name], MD Inpatient   10/26/2016 1528 10/26/2016 2042 Full Code [id number]  [name], MD Inpatient   08/29/2016 2026 09/02/2016 1954 Full Code [id number]  Demetrios Loll, MD Inpatient   03/28/2016 0651 04/02/2016 0049 Full Code [id number]  Harrie Foreman, MD ED   09/10/2014 1635 09/12/2014 1715 Full Code [id number]  Aldean Jewett, MD Inpatient   08/28/2014 0945 08/29/2014 1501 Full Code [id number]  Bettey Costa, MD Inpatient   Advance Care Planning Activity       TOTAL TIME TAKING CARE OF THIS PATIENT: *35* minutes.    Fritzi Mandes M.D  [facility]    CC: Primary care physician; McLean-Scocuzza, Nino Glow, MD

## 2019-11-08 ENCOUNTER — Other Ambulatory Visit: Payer: Self-pay

## 2019-11-08 ENCOUNTER — Telehealth: Payer: Self-pay

## 2019-11-08 ENCOUNTER — Other Ambulatory Visit: Payer: PPO | Admitting: Adult Health Nurse Practitioner

## 2019-11-08 DIAGNOSIS — Z515 Encounter for palliative care: Secondary | ICD-10-CM

## 2019-11-08 DIAGNOSIS — I509 Heart failure, unspecified: Secondary | ICD-10-CM

## 2019-11-08 NOTE — Telephone Encounter (Signed)
Unable to reach patient for transition of care. No answer. Hospital follow up scheduled 11/19/19 @ 2:30. Keep all scheduled appointments.

## 2019-11-09 NOTE — Progress Notes (Signed)
[facility] Consult Note Telephone: [phone]  Fax: [phone]  PATIENT NAME: [name] DOB: [birth year] MRN: [id number]  PRIMARY CARE PROVIDER:   McLean-Scocuzza, Nino Glow, MD  REFERRING PROVIDER:  McLean-Scocuzza, Nino Glow, MD [place],  [place] [address]  RESPONSIBLE PARTY:   Percell Locus, daughter [phone]   RECOMMENDATIONS and PLAN: 1.Advanced care planning. Patient is full code.  Did not discuss with patient today as she was not staying awake to have a good ACP conversation.  Daughter stated that they have not discussed it since she has been in the hospital and patient was not seen by palliative in the hospital.  2.  Functional status.  Patient just got home yesterday from the hospital.  Family states that she is able to get up and go to the bedside commode. Prior to going to hospital patient was starting to have functional declining and requiring more assistance with walking and transfers.  She already required assistance with ADLs.  Patient did have a fall a week prior to going to hospital.  Patient discharged with home health services and family would like to see if this will be beneficial.  Continue home health services as ordered once evaluation done  3.  Nutritional status.  Daughter does state that while in hospital she only ate bites.  Since being home she has been thirsty but has not eaten.  Have encouraged supplementation.  Caregiver states that has friend or family member that has used coconut water to increase appetite and family is going to try this as well.  Encouraged to try to get her to eat anything that she is willing to eat at this time.   Daughter is interested in hospice services if her mother does not improve.  Gave her main number for hospice triage if she has any problems over the weekend.  Will check in next week to see how patient is doing.  Next appointment is in 10 days  for follow up.   I spent 50 minutes providing this consultation,  from 1:30 to 2:20 including time spent with patient/family, chart review, provider coordination, documentation. More than 50% of the time in this consultation was spent coordinating communication.   HISTORY OF PRESENT ILLNESS:  [name] is a 86 y.o. 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 Palliative Care was asked to help address goals of care. Patient was in hospital 9/7-11/07/2019 for pulmonary edema and community acquired pneumonia.  Patient was discharged with home health.  Denies fever, increased SOB or cough, N/V/D.  Did have constipation while in hospital.  Gave MOM and enema yesterday and patient had several loose stools during the night and that is why she is sleepy today.  She does generally get Miralax daily.    CODE STATUS: DNR  PPS: 30% HOSPICE ELIGIBILITY/DIAGNOSIS: TBD  PHYSICAL EXAM:   General: NAD, frail appearing Cardiovascular: regular rate and rhythm Pulmonary: clear ant fields; normal respiratory effort Abdomen: soft, nontender, + bowel sounds GU: no suprapubic tenderness Extremities: no edema, no joint deformities Skin: no rashes on exposed skin Neurological: Weakness; patient sleepy today    PAST MEDICAL HISTORY:  Past Medical History:  Diagnosis Date   Anemia    Anxiety    Carotid arterial disease ([facility])    a. 08/2014 U/S: Bilateral < 50% stenosis. Patent vertebrals  w/ antegrade flow.    Cervical spine arthritis    Chronic diastolic CHF (congestive heart failure) ([facility])    a. echo 2014: EF 55-60%, DD;  b. 08/2014 Echo: EF 55-60%, no RWMA, GR1DD; c. 02/2016 Echo: EF 65-70%, Gr1 DD.   Chronic kidney disease    Cognitive impairment    Coronary artery disease    Depression    Diabetes mellitus without complication ([facility])    Dysrhythmia     Essential hypertension    GERD (gastroesophageal reflux disease)    Glaucoma    Gout    Heart murmur    History of renal impairment    Hyperlipidemia    Hypotension    a. Related to Norvasc - 11/2014 ED visit.   Multinodular goiter    a. Noted incidentally on CT 07/2012 and carotid U/S 08/2014;  b. Nl TSH 11/2014.   Paroxysmal SVT (supraventricular tachycardia) ([facility])    a. on Toprol    Prolapsed uterus    Severe aortic stenosis    a. 11/15/16: s/p TAVR Edwards Sapien 3 THV (size 23 mm, model # 9600TFX, serial # [id number])   Stroke ([facility])    a. CT head 07/2014 showed small thalamic infarct   Vitamin deficiency     SOCIAL HX:  Social History   Tobacco Use   Smoking status: Never Smoker   Smokeless tobacco: Never Used  Substance Use Topics   Alcohol use: No    ALLERGIES:  Allergies  Allergen Reactions   Metformin Hcl Other (See Comments)    GI upset   Metformin Other (See Comments), Diarrhea and Nausea And Vomiting    Other reaction(s): Other (See Comments), Unknown   Metformin And Related Diarrhea     PERTINENT MEDICATIONS:  Outpatient Encounter Medications as of 11/08/2019  Medication Sig   ACCU-CHEK AVIVA PLUS test strip USE 1 STRIP TO TEST BLOOD SUGARS 3 TIMES A DAY   acetaminophen (TYLENOL) 500 MG tablet Take 1 tablet (500 mg total) by mouth every 6 (six) hours as needed for mild pain or headache.   allopurinol (ZYLOPRIM) 100 MG tablet Take 1 tablet (100 mg total) by mouth daily.   aspirin 81 MG chewable tablet Chew 1 tablet (81 mg total) by mouth daily.   blood glucose meter kit and supplies Dispense based on patient and insurance preference. Use 1x daily as directed. (FOR ICD-10 E10.9, E11.9).   brimonidine (ALPHAGAN P) 0.1 % SOLN Place 1 drop into the right eye 2 (two) times daily.   Carboxymethylcellulose Sodium (ARTIFICIAL TEARS OP) Apply 1 drop to eye 2 (two) times daily. Left eye   cefdinir (OMNICEF) 300 MG capsule Take 1 capsule (300  mg total) by mouth every 12 (twelve) hours for 5 days.   cholecalciferol (VITAMIN D) 25 MCG (1000 UT) tablet Take 1 tablet (1,000 Units total) by mouth daily.   clotrimazole (LOTRIMIN) 1 % cream APPLY TOPICALLY TO THE AFFECTED AREA ONCE A DAY AT BEDTIME FOR 7-14 DAYS   Continuous Blood Gluc Receiver (FREESTYLE LIBRE 14 DAY READER) DEVI 1 Device by Does not apply route every 14 (fourteen) days.   Continuous Blood Gluc Sensor (FREESTYLE LIBRE 14 DAY SENSOR) MISC 1 Device by Does not apply route every 14 (fourteen) days.   dimethicone (PROSHIELD PLUS SKIN PROTECTANT) 1 % cream 1 application to the buttock 2 times daily to help protect skin   Dorzolamide HCl-Timolol Mal (COSOPT OP) Place 1 drop into the right eye 2 (two) times daily.    ferrous  sulfate 220 (44 Fe) MG/5ML solution Take 5 mLs (220 mg total) by mouth daily with breakfast.   gabapentin (NEURONTIN) 100 MG capsule TAKE 1 CAPSULE BY MOUTH EVERY DAY 1 HOUR BEFORE BEDTIME (Patient taking differently: Take 100 mg by mouth at bedtime. )   gabapentin (NEURONTIN) 300 MG capsule Take 1 capsule (300 mg total) by mouth 2 (two) times daily.   insulin aspart (NOVOLOG FLEXPEN) 100 UNIT/ML FlexPen TID before meals 15-30 minutes based on sugar reading before meals If sugar: 70-130 0 units, 131-180 2 units, 181-240 4 units, 241-300 6 units, 301-350 8 units, 351-400 10 units, >400 12 units and call doctor   insulin degludec (TRESIBA FLEXTOUCH) 100 UNIT/ML FlexTouch Pen Inject 0.1 mLs (10 Units total) into the skin daily.   Insulin Pen Needle (PEN NEEDLES) 30G X 8 MM MISC 1 Device by Does not apply route once a week.   Insulin Pen Needle (PEN NEEDLES) 31G X 8 MM MISC 1 pen by Does not apply route daily.   Lancet Device MISC 1 Device by Does not apply route as directed.   Lancets (ONETOUCH ULTRASOFT) lancets Bid E 11.9 Use as instructed   latanoprost (XALATAN) 0.005 % ophthalmic solution Place 1 drop into the right eye at bedtime.    LORazepam  (ATIVAN) 1 MG tablet Take 1 tablet (1 mg total) by mouth at bedtime as needed for anxiety.   losartan (COZAAR) 25 MG tablet Take 1 tablet (25 mg total) by mouth daily.   lovastatin (MEVACOR) 20 MG tablet Take 1 tablet (20 mg total) by mouth at bedtime. Note reduced dose due to age and chronic medical issues d/c 40 mg dose   metoprolol succinate (TOPROL-XL) 25 MG 24 hr tablet Take 1 tablet (25 mg total) by mouth daily.   metroNIDAZOLE (METROGEL) 0.75 % vaginal gel Place 1 Applicatorful vaginally 2 (two) times daily. Prn BV   Multiple Vitamin (MULTI-VITAMIN PO) Take 1 tablet by mouth daily.   ONETOUCH DELICA LANCETS 19Q MISC USE TO TEST BLOOD SUGAR ONCE A DAY   pantoprazole (PROTONIX) 40 MG tablet Take 1 tablet (40 mg total) by mouth daily. 30 minutes before breakfast OR dinner   pilocarpine (PILOCAR) 4 % ophthalmic solution Place 1 drop into the right eye 4 (four) times daily.    Semaglutide,0.25 or 0.5MG/DOS, (OZEMPIC, 0.25 OR 0.5 MG/DOSE,) 2 MG/1.5ML SOPN Inject 0.25 mg into the skin once a week.   SEROQUEL 25 MG tablet Take 25 mg by mouth at bedtime.    sertraline (ZOLOFT) 50 MG tablet Take 1 tablet (50 mg total) by mouth daily.   spironolactone (ALDACTONE) 25 MG tablet Take 25 mg by mouth daily.   torsemide (DEMADEX) 20 MG tablet Take 2 tablets (40 mg total) by mouth as directed. Take 40MG by mouth every day with extra 40MG after lunch for weight greater than 180 lbs (Patient taking differently: Take 40 mg by mouth 2 (two) times daily. Take 40MG by mouth every day with extra 40MG after lunch for weight greater than 180 lbs)   traMADol (ULTRAM) 50 MG tablet Take 50 mg by mouth every 8 (eight) hours as needed.   traZODone (DESYREL) 100 MG tablet Take 0.5-1 tablets (50-100 mg total) by mouth at bedtime as needed.   No facility-administered encounter medications on file as of 11/08/2019.    Shanitha Twining Jenetta Downer, NP

## 2019-11-11 ENCOUNTER — Telehealth: Payer: Self-pay | Admitting: Internal Medicine

## 2019-11-11 NOTE — Telephone Encounter (Signed)
Noted  For your information   

## 2019-11-11 NOTE — Telephone Encounter (Signed)
[facility] care nurse called in stated that there would be a delay in patient care today she would not be going today she be going on Tuesday 9-14

## 2019-11-12 ENCOUNTER — Telehealth: Payer: Self-pay

## 2019-11-12 ENCOUNTER — Telehealth: Payer: Self-pay | Admitting: Family

## 2019-11-12 ENCOUNTER — Telehealth: Payer: Self-pay | Admitting: Internal Medicine

## 2019-11-12 NOTE — Telephone Encounter (Signed)
Spoke with patient's daughter to check in. Daughter shared that she continues to see changes in patient and would like to pursue hospice evaluation. Message sent to PCP for order and confirm she will remain attending.

## 2019-11-12 NOTE — Telephone Encounter (Signed)
Patient and family would prefer a virtual visit tomorrow  Please advise

## 2019-11-12 NOTE — Telephone Encounter (Signed)
[facility] nurse called and stated that patient does not need home care because she has palliative care and is being sent over to hospice

## 2019-11-12 NOTE — Telephone Encounter (Signed)
For your information  

## 2019-11-12 NOTE — Telephone Encounter (Signed)
Patient does not qualify for tcm with palliative/hospice care. Standard hfu.

## 2019-11-12 NOTE — Telephone Encounter (Signed)
Virtual via phone or video would be perfectly fine. It appears from chart review that they are transitioning her to hospice care. If they are transitioning to hospice care, I will gladly do virtual visit with her and her daughter but if they wish to cancel that is fine as well.   Loel Dubonnet, NP

## 2019-11-13 ENCOUNTER — Encounter: Payer: Self-pay | Admitting: Family

## 2019-11-13 ENCOUNTER — Telehealth (INDEPENDENT_AMBULATORY_CARE_PROVIDER_SITE_OTHER): Payer: PPO | Admitting: Family

## 2019-11-13 ENCOUNTER — Other Ambulatory Visit: Payer: Self-pay

## 2019-11-13 VITALS — BP 118/90 | HR 73 | Ht 63.0 in | Wt 171.8 lb

## 2019-11-13 DIAGNOSIS — I890 Lymphedema, not elsewhere classified: Secondary | ICD-10-CM | POA: Diagnosis not present

## 2019-11-13 DIAGNOSIS — I5032 Chronic diastolic (congestive) heart failure: Secondary | ICD-10-CM

## 2019-11-13 DIAGNOSIS — I272 Pulmonary hypertension, unspecified: Secondary | ICD-10-CM

## 2019-11-13 DIAGNOSIS — Z79899 Other long term (current) drug therapy: Secondary | ICD-10-CM

## 2019-11-13 DIAGNOSIS — I10 Essential (primary) hypertension: Secondary | ICD-10-CM | POA: Diagnosis not present

## 2019-11-13 DIAGNOSIS — N184 Chronic kidney disease, stage 4 (severe): Secondary | ICD-10-CM | POA: Diagnosis not present

## 2019-11-13 DIAGNOSIS — Z952 Presence of prosthetic heart valve: Secondary | ICD-10-CM | POA: Diagnosis not present

## 2019-11-13 NOTE — Addendum Note (Signed)
Addended by: Orland Mustard on: 11/13/2019 06:53 AM   Modules accepted: Orders

## 2019-11-13 NOTE — Progress Notes (Signed)
Virtual Visit via Telephone Note   This visit type was conducted due to national recommendations for restrictions regarding the COVID-19 Pandemic (e.g. social distancing) in an effort to limit this patient's exposure and mitigate transmission in our community.  Due to her co-morbid illnesses, this patient is at least at moderate risk for complications without adequate follow up.  This format is felt to be most appropriate for this patient at this time.  The patient did not have access to video technology/had technical difficulties with video requiring transitioning to audio format only (telephone).  All issues noted in this document were discussed and addressed.  No physical exam could be performed with this format.  Please refer to the patient's chart for her  consent to telehealth for [facility].    Date:  11/13/2019   ID:  [name], DOB 12/02/1924, MRN [id number] The patient was identified using 2 identifiers.  Patient Location: Home Provider Location: Office/Clinic  PCP:  McLean-Scocuzza, Nino Glow, MD  Cardiologist:  Ida Rogue, MD  Electrophysiologist:  None   Evaluation Performed:  Follow-Up Visit  Chief Complaint:  Hospital follow up  History of Present Illness:    [name] is a 86 y.o. female with CAD, HFpEF, HTN, severe AS s/p TAVR, anemia, carotid artery disease, CVA, thyroid disease, GERD, SVT (08/2014), lymphedema.   Cardiac cath 10/26/16 patent coronaries with exception of severe stenosis in R PDA, mild nonobstructive stenosis of the LAD and rCA with patent LM and LCx, mod-severe pulmonary HTN. Echo 03/09/18 LVEF 50-55% with mildly elevated PA pressure 35mmHg. Echo 11/06/19 EF 50-55%, no RWMA, mild LVH, moderate TR, RV moderately enlarged, severely elevated PASP.  Admitted 11/05/19 with failure to thrive. She was treated for acute on chronic diastolic heart failure with Lasix $RemoveBe'40mg'pOMMsnXdD$  IV. ARB was added. She was treated for community acquired  pneumonia. She was discharged with hospice care. Labs on day of discharge 11/07/19 creatnine 1.02, GFR 55.   Contacted via telephone with [name] and her daughter [name].  Mostly spoke with her daughter due to the patient's illness.  Tells me that after hospital discharge she had 2 days of being perkier and over the last 2 to 3 days has noted more weakness and "speaking".  Reports her breathing has been somewhat shallow and [name] has been more comfortable when sitting up.  Her blood pressure is well controlled at home and she denies lightheadedness, dizziness.  Reports no chest pain, pressure, tightness.  Her daughter yesterday reached out to the primary care to request transition to hospice care.  She has been taking spironolactone 25 mg daily, torsemide 40 mg twice daily and taking the second dose in the early afternoon.  Daughter was concerned that she did not seem to be urinating as frequently as previous on the diuretics.  We discussed that her weight is down 10 pounds from clinic visit 09/15/2019.  She reports mild lower extremity edema in the thighs though they have been using compression stockings and having her wear her lymphedema pumps twice per day.  Encouraged to continue.  The patient does not have symptoms concerning for COVID-19 infection (fever, chills, cough, or new shortness of breath).    Past Medical History:  Diagnosis Date  . Anemia   . Anxiety   . Carotid arterial disease ([place])    a. 08/2014 U/S: Bilateral < 50% stenosis. Patent vertebrals w/ antegrade flow.   . Cervical spine arthritis   . Chronic diastolic CHF (congestive heart failure) (  [place])    a. echo 2014: EF 55-60%, DD;  b. 08/2014 Echo: EF 55-60%, no RWMA, GR1DD; c. 02/2016 Echo: EF 65-70%, Gr1 DD.  Marland Kitchen Chronic kidney disease   . Cognitive impairment   . Coronary artery disease   . Depression   . Diabetes mellitus without complication ([place])   . Dysrhythmia   . Essential hypertension   . GERD  (gastroesophageal reflux disease)   . Glaucoma   . Gout   . Heart murmur   . History of renal impairment   . Hyperlipidemia   . Hypotension    a. Related to Norvasc - 11/2014 ED visit.  . Multinodular goiter    a. Noted incidentally on CT 07/2012 and carotid U/S 08/2014;  b. Nl TSH 11/2014.  Marland Kitchen Paroxysmal SVT (supraventricular tachycardia) ([facility])    a. on Toprol   . Prolapsed uterus   . Severe aortic stenosis    a. 11/15/16: s/p TAVR Edwards Sapien 3 THV (size 23 mm, model # 9600TFX, serial # [id number])  . Stroke [facility])    a. CT head 07/2014 showed small thalamic infarct  . Vitamin deficiency    Past Surgical History:  Procedure Laterality Date  . EYE SURGERY     unsure of what procedure, did know that laser was used  . IR RADIOLOGY PERIPHERAL GUIDED IV START  11/03/2016  . IR US GUIDE VASC ACCESS RIGHT  11/03/2016  . RIGHT/LEFT HEART CATH AND CORONARY ANGIOGRAPHY N/A 10/26/2016   Procedure: RIGHT/LEFT HEART CATH AND CORONARY ANGIOGRAPHY;  Surgeon: [name], MD;  Location: [place] [facility];  Service: Cardiovascular;  Laterality: N/A;  . TEE WITHOUT CARDIOVERSION N/A 03/31/2016   Procedure: TRANSESOPHAGEAL ECHOCARDIOGRAM (TEE);  Surgeon: Minna Merritts, MD;  Location: [facility] ORS;  Service: Cardiovascular;  Laterality: N/A;  . TEE WITHOUT CARDIOVERSION N/A 11/15/2016   Procedure: TRANSESOPHAGEAL ECHOCARDIOGRAM (TEE);  Surgeon: [name], MD;  Location: [place];  Service: Open Heart Surgery;  Laterality: N/A;  . TRANSCATHETER AORTIC VALVE REPLACEMENT, TRANSFEMORAL N/A 11/15/2016   Procedure: TRANSCATHETER AORTIC VALVE REPLACEMENT, TRANSFEMORAL;  Surgeon: [name], MD;  Location: [place];  Service: Open Heart Surgery;  Laterality: N/A;  . TUBAL LIGATION       Current Meds  Medication Sig  . ACCU-CHEK AVIVA PLUS test strip USE 1 STRIP TO TEST BLOOD SUGARS 3 TIMES A DAY  . acetaminophen (TYLENOL) 500 MG tablet Take 1 tablet (500 mg total) by mouth every 6 (six) hours as needed for  mild pain or headache.  . allopurinol (ZYLOPRIM) 100 MG tablet Take 1 tablet (100 mg total) by mouth daily.  Marland Kitchen aspirin 81 MG chewable tablet Chew 1 tablet (81 mg total) by mouth daily.  . blood glucose meter kit and supplies Dispense based on patient and insurance preference. Use 1x daily as directed. (FOR ICD-10 E10.9, E11.9).  . brimonidine (ALPHAGAN P) 0.1 % SOLN Place 1 drop into the right eye 2 (two) times daily.  . Carboxymethylcellulose Sodium (ARTIFICIAL TEARS OP) Apply 1 drop to eye 2 (two) times daily. Left eye  . cholecalciferol (VITAMIN D) 25 MCG (1000 UT) tablet Take 1 tablet (1,000 Units total) by mouth daily.  . clotrimazole (LOTRIMIN) 1 % cream APPLY TOPICALLY TO THE AFFECTED AREA ONCE A DAY AT BEDTIME FOR 7-14 DAYS  . Continuous Blood Gluc Receiver (FREESTYLE LIBRE 14 DAY READER) DEVI 1 Device by Does not apply route every 14 (fourteen) days.  . Continuous Blood Gluc Sensor (FREESTYLE LIBRE 14 DAY SENSOR) MISC  1 Device by Does not apply route every 14 (fourteen) days.  . dimethicone (PROSHIELD PLUS SKIN PROTECTANT) 1 % cream 1 application to the buttock 2 times daily to help protect skin  . Dorzolamide HCl-Timolol Mal (COSOPT OP) Place 1 drop into the right eye 2 (two) times daily.   . ferrous sulfate 220 (44 Fe) MG/5ML solution Take 5 mLs (220 mg total) by mouth daily with breakfast.  . gabapentin (NEURONTIN) 100 MG capsule TAKE 1 CAPSULE BY MOUTH EVERY DAY 1 HOUR BEFORE BEDTIME (Patient taking differently: Take 100 mg by mouth at bedtime. )  . gabapentin (NEURONTIN) 300 MG capsule Take 1 capsule (300 mg total) by mouth 2 (two) times daily.  . insulin aspart (NOVOLOG FLEXPEN) 100 UNIT/ML FlexPen TID before meals 15-30 minutes based on sugar reading before meals If sugar: 70-130 0 units, 131-180 2 units, 181-240 4 units, 241-300 6 units, 301-350 8 units, 351-400 10 units, >400 12 units and call doctor  . insulin degludec (TRESIBA FLEXTOUCH) 100 UNIT/ML FlexTouch Pen Inject 0.1 mLs  (10 Units total) into the skin daily.  . Insulin Pen Needle (PEN NEEDLES) 30G X 8 MM MISC 1 Device by Does not apply route once a week.  . Insulin Pen Needle (PEN NEEDLES) 31G X 8 MM MISC 1 pen by Does not apply route daily.  Elmore Guise Device MISC 1 Device by Does not apply route as directed.  . Lancets (ONETOUCH ULTRASOFT) lancets Bid E 11.9 Use as instructed  . latanoprost (XALATAN) 0.005 % ophthalmic solution Place 1 drop into the right eye at bedtime.   Marland Kitchen LORazepam (ATIVAN) 1 MG tablet Take 1 tablet (1 mg total) by mouth at bedtime as needed for anxiety.  Marland Kitchen losartan (COZAAR) 25 MG tablet Take 1 tablet (25 mg total) by mouth daily.  Marland Kitchen lovastatin (MEVACOR) 20 MG tablet Take 1 tablet (20 mg total) by mouth at bedtime. Note reduced dose due to age and chronic medical issues d/c 40 mg dose  . metoprolol succinate (TOPROL-XL) 25 MG 24 hr tablet Take 1 tablet (25 mg total) by mouth daily.  . metroNIDAZOLE (METROGEL) 0.75 % vaginal gel Place 1 Applicatorful vaginally 2 (two) times daily. Prn BV  . Multiple Vitamin (MULTI-VITAMIN PO) Take 1 tablet by mouth daily.  Glory Rosebush DELICA LANCETS 26O MISC USE TO TEST BLOOD SUGAR ONCE A DAY  . pantoprazole (PROTONIX) 40 MG tablet Take 1 tablet (40 mg total) by mouth daily. 30 minutes before breakfast OR dinner  . pilocarpine (PILOCAR) 4 % ophthalmic solution Place 1 drop into the right eye 4 (four) times daily.   . Semaglutide,0.25 or 0.5MG/DOS, (OZEMPIC, 0.25 OR 0.5 MG/DOSE,) 2 MG/1.5ML SOPN Inject 0.25 mg into the skin once a week.  . SEROQUEL 25 MG tablet Take 25 mg by mouth at bedtime.   . sertraline (ZOLOFT) 50 MG tablet Take 1 tablet (50 mg total) by mouth daily.  Marland Kitchen spironolactone (ALDACTONE) 25 MG tablet Take 25 mg by mouth daily.  Marland Kitchen torsemide (DEMADEX) 20 MG tablet Take 2 tablets (40 mg total) by mouth as directed. Take 40MG by mouth every day with extra 40MG after lunch for weight greater than 180 lbs (Patient taking differently: Take 40 mg by mouth 2  (two) times daily. Take 40MG by mouth every day with extra 40MG after lunch for weight greater than 180 lbs)  . traMADol (ULTRAM) 50 MG tablet Take 50 mg by mouth every 8 (eight) hours as needed.  . traZODone (DESYREL) 100 MG  tablet Take 0.5-1 tablets (50-100 mg total) by mouth at bedtime as needed.     Allergies:   Metformin hcl, Metformin, and Metformin and related   Social History   Tobacco Use  . Smoking status: Never Smoker  . Smokeless tobacco: Never Used  Vaping Use  . Vaping Use: Never used  Substance Use Topics  . Alcohol use: No  . Drug use: No    Family Hx: The patient's family history includes Colitis in her father; Diabetes in her mother; Glaucoma in her mother; Peptic Ulcer in her father.  ROS:   Please see the history of present illness.    Review of Systems  Constitutional: Positive for malaise/fatigue. Negative for chills and fever.  Cardiovascular: Positive for dyspnea on exertion and leg swelling. Negative for chest pain, near-syncope, orthopnea, palpitations and syncope.  Respiratory: Positive for shortness of breath. Negative for cough and wheezing.   Gastrointestinal: Negative for nausea and vomiting.  Neurological: Negative for dizziness, light-headedness and weakness.   All other systems reviewed and are negative.  Prior CV studies:   The following studies were reviewed today:  Echo 11/06/19  1. Left ventricular ejection fraction, by estimation, is 50 to 55%. The  left ventricle has low normal function. The left ventricle has no regional  wall motion abnormalities. There is mild left ventricular hypertrophy.  Left ventricular diastolic function   could not be evaluated.   2. Tricuspid valve pressure gradient = 7mHg. Right ventricular systolic  function is normal. The right ventricular size is moderately enlarged.  There is severely elevated pulmonary artery systolic pressure.   3. Left atrial size was mildly dilated.   4. The mitral valve is  degenerative. Trivial mitral valve regurgitation.   5. Tricuspid valve regurgitation is moderate.   6. The aortic valve has been repaired/replaced. Aortic valve  regurgitation is not visualized.   Echo 03/09/18 Left ventricle: The cavity size was normal. There was moderate   concentric hypertrophy. Systolic function was normal. The   estimated ejection fraction was in the range of 50% to 55%. Wall   motion was normal; there were no regional wall motion   abnormalities. Features are consistent with a pseudonormal left   ventricular filling pattern, with concomitant abnormal relaxation   and increased filling pressure (grade 2 diastolic dysfunction). - Aortic valve: A bioprosthesis ( TAVR) was present and functioning   normally. Mean gradient (S): 15 mm Hg. Valve area (VTI): 1.51   cm^2. - Mitral valve: Calcified annulus. - Left atrium: The atrium was mildly dilated. - Atrial septum: There was an atrial septal aneurysm. - Pulmonary arteries: Systolic pressure was mildly increased. PA   peak pressure: 45 mm Hg (S).  Labs/Other Tests and Data Reviewed:    EKG:  No ECG reviewed.  Recent Labs: 04/17/2019: TSH 1.52 11/05/2019: ALT 13; B Natriuretic Peptide 160.1; Hemoglobin 13.8; Platelets 153 11/07/2019: BUN 21; Creatinine, Ser 1.02; Potassium 3.6; Sodium 144   Recent Lipid Panel Lab Results  Component Value Date/Time   CHOL 167 07/16/2019 09:12 AM   TRIG 172.0 (H) 07/16/2019 09:12 AM   HDL 36.40 (L) 07/16/2019 09:12 AM   CHOLHDL 5 07/16/2019 09:12 AM   LDLCALC 96 07/16/2019 09:12 AM   LDLDIRECT 73.0 04/17/2019 11:03 AM    Wt Readings from Last 3 Encounters:  11/13/19 171 lb 12.8 oz (77.9 kg)  11/06/19 170 lb 14.4 oz (77.5 kg)  09/10/19 181 lb 4 oz (82.2 kg)    Objective:    Vital  Signs:  BP 118/90   Pulse 73   Ht 5' 3"  (1.6 m)   Wt 171 lb 12.8 oz (77.9 kg)   BMI 30.43 kg/m    VITAL SIGNS:  reviewed  ASSESSMENT & PLAN:    1. Medication management -Daughter reached out  to primary care yesterday to request transition to hospice care.  She requested that her mother's upcoming appointment with the [facility] heart failure [facility] be canceled and requested to keep her appointment with Dr. Rockey Situ in October but transition to virtual.  Request was more than reasonable and made those changes.  We also discussed that as she transitions to hospice care we will likely be able to simplify her medication regimen.  Though I think her torsemide and spironolactone are important for her comfort measures, we should consider discontinuing medications such as aspirin, statin, losartan at the discretion of the hospice provider.  2. HFpEF -unable to weigh at home due to frailty.  Daughter reports her shortness of breath is overall stable.  Reports some edema to her thighs which is likely multifactorial HFpEF and lymphedema.  GDMT includes beta-blocker, ARB, torsemide, spironolactone.  Continue torsemide 40 mg twice daily and spironolactone 25 mg daily.  3. Lymphedema -continue present diuretic dose, as above.  Continue to utilize lymphedema pumps and compression stockings.  Continue to elevate lower extremities as tolerated.  4. HTN - BP well controlled. Continue current antihypertensive regimen.   5. SVT - No evidence of recurrence.  Denies palpitations.  Continue Toprol 25 mg daily.  6. CAD - No anginal symptoms.  GDMT includes beta-blocker, statin.  7. Pulmonary HTN -Echo 11/06/2019 with severely elevated PASP.  Continue torsemide and spironolactone.  Appreciated of palliative and hospice assistance for comfort.  8. Severe AS s/p TAVR -valve functioning appropriately by echo 11/06/2019.  Time:   Today, I have spent 15 minutes with the patient with telehealth technology discussing the above problems.     Medication Adjustments/Labs and Tests Ordered: Current medicines are reviewed at length with the patient today.  Concerns regarding medicines are outlined above.   Tests Ordered: No  orders of the defined types were placed in this encounter.  Medication Changes: No orders of the defined types were placed in this encounter.  Follow Up:  Virtual Visit  in October as previously scheduled with Dr. Rockey Situ. Daughter may cancel at her discretion as Cassandra Braxton is transitioning to hospice care.  Signed, Loel Dubonnet, NP  11/13/2019 12:56 PM    [facility]

## 2019-11-13 NOTE — Patient Instructions (Addendum)
Medication Instructions:  No medication changes today.  Continue Torsemide 40mg  twice daily and Spironolactone 25mg  daily.   *If you need a refill on your cardiac medications before your next appointment, please call your pharmacy*   Lab Work: None ordered today.  Testing/Procedures: None ordered today.  Follow-Up: At [facility], you and your health needs are our priority.  As part of our continuing mission to provide you with exceptional heart care, we have created designated Provider Care Teams.  These Care Teams include your primary Cardiologist (physician) and Advanced Practice Providers (APPs -  Physician Assistants and Nurse Practitioners) who all work together to provide you with the care you need, when you need it.  We recommend signing up for the patient portal called "MyChart".  Sign up information is provided on this After Visit Summary.  MyChart is used to connect with patients for Virtual Visits (Telemedicine).  Patients are able to view lab/test results, encounter notes, upcoming appointments, etc.  Non-urgent messages can be sent to your provider as well.   To learn more about what you can do with MyChart, go to [email or web address].    Your next appointment:   12/17/19 with Dr. Rockey Situ via telephone. If you wish to cancel or adjust this appointment, simply call or send a MyChart message to let us know.   Your appointment with Darylene Price, NP has been cancelled.   Other Instructions  Continue to use lymphedema pumps and compression stockings.   Hospice provider can also help assist with any adjustment of diuretics (fluid pills) but don't hesitate to call or send MyChart message if there are changes in swelling or breathing.   Talk with hospice provider about simplifying her medication regimen as this may help with taking medications. For example, her Pravastatin (cholesterol medication) could likely be discontinued. We are fine with any recommendations they make  regarding medication changes as they are very knowledgeable.

## 2019-11-13 NOTE — Telephone Encounter (Signed)
Scheduling has switched visit to virtual. Closing encounter.

## 2019-11-19 ENCOUNTER — Ambulatory Visit: Payer: PPO | Admitting: Internal Medicine

## 2019-11-19 NOTE — Telephone Encounter (Signed)
Faxed to [facility] notification of [place] Admission on 11-14-19 faxed on 11-19-19

## 2019-11-22 ENCOUNTER — Telehealth: Payer: Self-pay | Admitting: Internal Medicine

## 2019-11-22 ENCOUNTER — Ambulatory Visit: Payer: PPO | Admitting: Family

## 2019-11-22 NOTE — Telephone Encounter (Signed)
Yes I specified in order hospice md manage pain, anxiety, comfort controlled substances always

## 2019-11-22 NOTE — Telephone Encounter (Signed)
Would you like hospice attending to manage pain meds?

## 2019-11-22 NOTE — Telephone Encounter (Signed)
Mitzi with Hospice called needing Verbal orders  Please call  [phone]  Pt is transing to actively dying request to give Morphine for pain  Requesting Morphine Concentrated liquid 20mg  per 41ml.. total of 69ml direction would be 5mg  every one hour as needed for pain and SOB   Sent rx to [facility]

## 2019-11-22 NOTE — Telephone Encounter (Signed)
Left detailed message on secure line  

## 2019-11-29 ENCOUNTER — Telehealth: Payer: Self-pay | Admitting: Internal Medicine

## 2019-12-02 NOTE — Telephone Encounter (Signed)
Called and lm daughter crystal expressing condolences

## 2019-12-05 ENCOUNTER — Ambulatory Visit: Payer: PPO

## 2019-12-06 ENCOUNTER — Telehealth: Payer: Self-pay

## 2019-12-06 NOTE — Telephone Encounter (Signed)
Linus Orn with [employer] dropped of death certificate to be signed. Placed in folder up front

## 2019-12-06 NOTE — Telephone Encounter (Signed)
Placed on your desk. 

## 2019-12-08 NOTE — Telephone Encounter (Signed)
Will be ready 10/12 when I return to the office

## 2019-12-09 NOTE — Telephone Encounter (Signed)
Noted, accordin to previous message per Dr Olivia Mackie McLean-Scocuzza, this will be ready tomorrow morning, 12/10/19

## 2019-12-09 NOTE — Telephone Encounter (Signed)
[name] needs death certificate signed as soon as possible. She will call first thing in the morning to see if it is ready for pick up.

## 2019-12-16 ENCOUNTER — Ambulatory Visit: Payer: PPO | Admitting: Cardiovascular Disease

## 2019-12-17 ENCOUNTER — Telehealth: Payer: PPO | Admitting: Cardiovascular Disease

## 2019-12-20 NOTE — Telephone Encounter (Signed)
This has been singed and picked up

## 2019-12-26 ENCOUNTER — Ambulatory Visit: Payer: PPO | Admitting: Podiatry

## 2019-12-30 NOTE — Telephone Encounter (Signed)
Noted, For your information

## 2019-12-30 NOTE — Telephone Encounter (Signed)
Patient passed away this morning, home. Time of death was 10:43am, 12-13-2019

## 2019-12-30 DEATH — deceased

## 2020-01-01 ENCOUNTER — Ambulatory Visit: Payer: PPO | Admitting: Family

## 2020-03-30 ENCOUNTER — Encounter (INDEPENDENT_AMBULATORY_CARE_PROVIDER_SITE_OTHER): Payer: PPO

## 2020-03-30 ENCOUNTER — Ambulatory Visit (INDEPENDENT_AMBULATORY_CARE_PROVIDER_SITE_OTHER): Payer: PPO | Admitting: Vascular Surgery
